# Patient Record
Sex: Female | Born: 1940 | Race: White | Hispanic: No | Smoking: Former smoker
Health system: Southern US, Community
[De-identification: ages and names within clinical notes are randomized; demographics above are authoritative.]

## PROBLEM LIST (undated history)

## (undated) DIAGNOSIS — Z9889 Other specified postprocedural states: Secondary | ICD-10-CM

## (undated) DIAGNOSIS — K5190 Ulcerative colitis, unspecified, without complications: Secondary | ICD-10-CM

## (undated) DIAGNOSIS — G894 Chronic pain syndrome: Secondary | ICD-10-CM

## (undated) DIAGNOSIS — I2510 Atherosclerotic heart disease of native coronary artery without angina pectoris: Secondary | ICD-10-CM

## (undated) DIAGNOSIS — M7060 Trochanteric bursitis, unspecified hip: Secondary | ICD-10-CM

## (undated) DIAGNOSIS — I10 Essential (primary) hypertension: Secondary | ICD-10-CM

## (undated) DIAGNOSIS — I739 Peripheral vascular disease, unspecified: Secondary | ICD-10-CM

## (undated) DIAGNOSIS — C4491 Basal cell carcinoma of skin, unspecified: Secondary | ICD-10-CM

## (undated) DIAGNOSIS — E785 Hyperlipidemia, unspecified: Secondary | ICD-10-CM

## (undated) DIAGNOSIS — I714 Abdominal aortic aneurysm, without rupture, unspecified: Secondary | ICD-10-CM

## (undated) DIAGNOSIS — R0789 Other chest pain: Secondary | ICD-10-CM

## (undated) DIAGNOSIS — R5382 Chronic fatigue, unspecified: Secondary | ICD-10-CM

## (undated) DIAGNOSIS — G4733 Obstructive sleep apnea (adult) (pediatric): Secondary | ICD-10-CM

## (undated) DIAGNOSIS — F32A Depression, unspecified: Secondary | ICD-10-CM

## (undated) DIAGNOSIS — I2699 Other pulmonary embolism without acute cor pulmonale: Secondary | ICD-10-CM

## (undated) DIAGNOSIS — M542 Cervicalgia: Secondary | ICD-10-CM

## (undated) DIAGNOSIS — M7050 Other bursitis of knee, unspecified knee: Secondary | ICD-10-CM

## (undated) DIAGNOSIS — D649 Anemia, unspecified: Principal | ICD-10-CM

## (undated) DIAGNOSIS — F329 Major depressive disorder, single episode, unspecified: Secondary | ICD-10-CM

## (undated) DIAGNOSIS — M797 Fibromyalgia: Secondary | ICD-10-CM

## (undated) DIAGNOSIS — G9332 Myalgic encephalomyelitis/chronic fatigue syndrome: Secondary | ICD-10-CM

## (undated) DIAGNOSIS — Z9289 Personal history of other medical treatment: Secondary | ICD-10-CM

## (undated) DIAGNOSIS — Z8679 Personal history of other diseases of the circulatory system: Secondary | ICD-10-CM

## (undated) HISTORY — DX: Essential (primary) hypertension: I10

## (undated) HISTORY — DX: Atherosclerotic heart disease of native coronary artery without angina pectoris: I25.10

## (undated) HISTORY — DX: Trochanteric bursitis, unspecified hip: M70.60

## (undated) HISTORY — DX: Other pulmonary embolism without acute cor pulmonale: I26.99

## (undated) HISTORY — DX: Abdominal aortic aneurysm, without rupture: I71.4

## (undated) HISTORY — DX: Personal history of other medical treatment: Z92.89

## (undated) HISTORY — PX: TUBAL LIGATION: SHX77

## (undated) HISTORY — DX: Abdominal aortic aneurysm, without rupture, unspecified: I71.40

## (undated) HISTORY — DX: Fibromyalgia: M79.7

## (undated) HISTORY — DX: Major depressive disorder, single episode, unspecified: F32.9

## (undated) HISTORY — DX: Depression, unspecified: F32.A

## (undated) HISTORY — DX: Cervicalgia: M54.2

## (undated) HISTORY — PX: HERNIA REPAIR: SHX51

## (undated) HISTORY — DX: Basal cell carcinoma of skin, unspecified: C44.91

## (undated) HISTORY — DX: Anemia, unspecified: D64.9

## (undated) HISTORY — DX: Hyperlipidemia, unspecified: E78.5

## (undated) HISTORY — DX: Obstructive sleep apnea (adult) (pediatric): G47.33

## (undated) HISTORY — DX: Chronic pain syndrome: G89.4

## (undated) HISTORY — DX: Ulcerative colitis, unspecified, without complications: K51.90

## (undated) HISTORY — DX: Other bursitis of knee, unspecified knee: M70.50

---

## 1989-08-04 HISTORY — PX: OTHER SURGICAL HISTORY: SHX169

## 1995-08-05 HISTORY — PX: CHOLECYSTECTOMY: SHX55

## 1996-08-04 HISTORY — PX: CORONARY ARTERY BYPASS GRAFT: SHX141

## 1998-06-15 ENCOUNTER — Other Ambulatory Visit: Admission: RE | Admit: 1998-06-15 | Discharge: 1998-06-15 | Payer: Self-pay | Admitting: Obstetrics and Gynecology

## 1999-10-11 ENCOUNTER — Encounter: Payer: Self-pay | Admitting: Cardiology

## 1999-10-11 ENCOUNTER — Encounter: Admission: RE | Admit: 1999-10-11 | Discharge: 1999-10-11 | Payer: Self-pay | Admitting: Cardiology

## 2003-06-07 ENCOUNTER — Other Ambulatory Visit: Admission: RE | Admit: 2003-06-07 | Discharge: 2003-06-07 | Payer: Self-pay | Admitting: Internal Medicine

## 2003-08-05 HISTORY — PX: CORONARY STENT PLACEMENT: SHX1402

## 2003-10-17 ENCOUNTER — Encounter: Admission: RE | Admit: 2003-10-17 | Discharge: 2003-10-17 | Payer: Self-pay | Admitting: Cardiology

## 2003-10-23 ENCOUNTER — Ambulatory Visit (HOSPITAL_COMMUNITY): Admission: RE | Admit: 2003-10-23 | Discharge: 2003-10-24 | Payer: Self-pay | Admitting: Cardiology

## 2004-05-08 ENCOUNTER — Ambulatory Visit (HOSPITAL_COMMUNITY): Admission: RE | Admit: 2004-05-08 | Discharge: 2004-05-08 | Payer: Self-pay | Admitting: Dentistry

## 2004-05-08 ENCOUNTER — Encounter (INDEPENDENT_AMBULATORY_CARE_PROVIDER_SITE_OTHER): Payer: Self-pay | Admitting: Specialist

## 2004-08-04 HISTORY — PX: CORONARY ANGIOPLASTY WITH STENT PLACEMENT: SHX49

## 2004-09-04 ENCOUNTER — Inpatient Hospital Stay (HOSPITAL_COMMUNITY): Admission: EM | Admit: 2004-09-04 | Discharge: 2004-09-05 | Payer: Self-pay | Admitting: Emergency Medicine

## 2004-09-25 ENCOUNTER — Encounter (HOSPITAL_COMMUNITY): Admission: RE | Admit: 2004-09-25 | Discharge: 2004-12-24 | Payer: Self-pay | Admitting: Cardiology

## 2004-12-25 ENCOUNTER — Encounter (HOSPITAL_COMMUNITY): Admission: RE | Admit: 2004-12-25 | Discharge: 2005-02-01 | Payer: Self-pay | Admitting: Cardiology

## 2005-01-07 ENCOUNTER — Encounter (HOSPITAL_COMMUNITY): Admission: RE | Admit: 2005-01-07 | Discharge: 2005-04-07 | Payer: Self-pay | Admitting: Gastroenterology

## 2005-02-24 ENCOUNTER — Emergency Department (HOSPITAL_COMMUNITY): Admission: EM | Admit: 2005-02-24 | Discharge: 2005-02-25 | Payer: Self-pay | Admitting: *Deleted

## 2005-02-26 ENCOUNTER — Encounter: Admission: RE | Admit: 2005-02-26 | Discharge: 2005-02-26 | Payer: Self-pay | Admitting: Gastroenterology

## 2005-03-13 ENCOUNTER — Ambulatory Visit (HOSPITAL_COMMUNITY): Admission: RE | Admit: 2005-03-13 | Discharge: 2005-03-13 | Payer: Self-pay | Admitting: Gastroenterology

## 2005-04-29 ENCOUNTER — Other Ambulatory Visit: Admission: RE | Admit: 2005-04-29 | Discharge: 2005-04-29 | Payer: Self-pay | Admitting: Internal Medicine

## 2005-05-21 ENCOUNTER — Ambulatory Visit (HOSPITAL_COMMUNITY): Admission: RE | Admit: 2005-05-21 | Discharge: 2005-05-21 | Payer: Self-pay | Admitting: Internal Medicine

## 2005-06-18 ENCOUNTER — Ambulatory Visit (HOSPITAL_COMMUNITY): Admission: RE | Admit: 2005-06-18 | Discharge: 2005-06-18 | Payer: Self-pay | Admitting: Cardiology

## 2005-07-31 ENCOUNTER — Inpatient Hospital Stay (HOSPITAL_COMMUNITY): Admission: AD | Admit: 2005-07-31 | Discharge: 2005-08-02 | Payer: Self-pay | Admitting: Gastroenterology

## 2005-08-02 ENCOUNTER — Ambulatory Visit: Payer: Self-pay | Admitting: Internal Medicine

## 2005-08-04 HISTORY — PX: CORONARY ANGIOPLASTY WITH STENT PLACEMENT: SHX49

## 2006-07-08 ENCOUNTER — Inpatient Hospital Stay (HOSPITAL_COMMUNITY): Admission: EM | Admit: 2006-07-08 | Discharge: 2006-07-09 | Payer: Self-pay | Admitting: Emergency Medicine

## 2006-07-30 ENCOUNTER — Encounter: Admission: RE | Admit: 2006-07-30 | Discharge: 2006-07-30 | Payer: Self-pay | Admitting: Cardiology

## 2007-04-28 ENCOUNTER — Other Ambulatory Visit: Admission: RE | Admit: 2007-04-28 | Discharge: 2007-04-28 | Payer: Self-pay | Admitting: Family Medicine

## 2009-09-13 ENCOUNTER — Other Ambulatory Visit: Admission: RE | Admit: 2009-09-13 | Discharge: 2009-09-13 | Payer: Self-pay | Admitting: Family Medicine

## 2010-04-23 ENCOUNTER — Encounter
Admission: RE | Admit: 2010-04-23 | Discharge: 2010-04-29 | Payer: Self-pay | Source: Home / Self Care | Attending: Physical Medicine and Rehabilitation | Admitting: Physical Medicine and Rehabilitation

## 2010-04-29 ENCOUNTER — Ambulatory Visit: Payer: Self-pay | Admitting: Physical Medicine and Rehabilitation

## 2010-12-20 NOTE — Discharge Summary (Signed)
Kara Bush, Kara Bush            ACCOUNT NO.:  [id number]   MEDICAL RECORD NO.:  [id number]          PATIENT TYPE:  INP   LOCATION:  6522                         FACILITY:  [facility]   PHYSICIAN:  Thereasa Solo. Little, M.D. DATE OF BIRTH:  1941-06-23   DATE OF ADMISSION:  09/03/2004  DATE OF DISCHARGE:  09/05/2004                                 DISCHARGE SUMMARY   HISTORY OF PRESENT ILLNESS:  Kara Bush is a 70 year old female patient who  came to the emergency room because of chest pain.  She has a history of  coronary artery disease dating back to 35.  She has undergone coronary  artery bypass surgery.  In March of 2005, she underwent stenting of an LAD  beyond the limb insertion, as well as stenting of the nongrafted circumflex.  She apparently was experiencing onset of chest pain and came to the  emergency room.  She was seen by Dr. Effie Shy who was on call for our  service.   HOSPITAL COURSE:  She then underwent cardiac catheterization on September 04, 2004, which revealed progressive disease with a new blockage past her LAD  stent.  She had an 80% stenosis.  She underwent Cypher stenting with a 3.0 x  13 by Nanetta Batty, M.D.  Her SVG to her diagonal was patent and her SVG  to her RCA was patent.  Her circumflex native vessel stent was also patent.  On the morning of September 05, 2004, she was seen by Gaspar Garbe B. Little, M.D.,  her primary cardiologist.  Her blood pressures were 120-130/60, heart rate  was in the 90s and room air saturations were 93%.  Her potassium was  slightly low at 3.4.  Her hemoglobin was 10.4, WBC was 10, BUN was 3 and  creatinine was 0.9.  Her LDL was 69 and HDL 39.  Troponin was 0.80 and CK  was 123/6.  Her EKG was without any changes.  Dr. Clarene Duke wanted to increase  her Vytorin to 10/40 and he wanted to add an ACE, however, this may have an  interaction with Imuran, thus we added an ARB for her endothelial vascular  health.  After she gets up and walks,  she will be discharged home.  She was  also given the potassium replacement with K-Dur 20 mEq.   DISCHARGE MEDICATIONS:  1.  Lexapro 10 mg one time per day.  2.  Prednisone 10 mg one time per day.  3.  Vytorin 10/40 mg one time a day.  4.  Lopressor 50 mg two times per day.  5.  Aspirin 81 mg one time per day.  6.  Plavix 75 mg one time per day.  She is not to stop.  She must be on it      for at least six months if not at least a year.  7.  Asacol 1600 mg three times per day.  8.  Cipro 500 mg two times per day for two days.  9.  Imuran 50 mg one time per day.  10. Diovan 40 mg which will be half of a tablet per day.  ACTIVITY:  She should do no strenuous activity, lifting, pushing or pulling  for four days.   DIET:  She is to be on a low-saturated diet.   WOUND CARE:  If she has any problems with her groin, she will give [place] a  call.   FOLLOWUP:  She will see Dr. Clarene Duke on September 25, 2004, at 9:15 a.m.   DISCHARGE DIAGNOSES:  1.  Unstable angina.  2.  Progressive coronary artery disease, status post cardiac catheterization      with a new lesion next to her left anterior descending stent.  She      underwent Cypher stenting 3.0 x 13.  Her circumflex stent was patent and      her saphenous vein graft to her diagonal and saphenous vein graft to her      right coronary artery were patent.  3.  Probable urinary tract infection treated with Cipro x 3 days.  4.  History of ulcerative colitis.  5.  History of fibromyalgia.  6.  Peripheral vascular disease with a prior aortic iliac bypass grafting      which was patent at the time of this catheterization.  7.  Hyperlipidemia.  8.  Hypertension.      BB/MEDQ  D:  09/05/2004  T:  09/05/2004  Job:  [id number]   cc:   Ike Bene, M.D.  [address]  [place]  [place] [address]  Fax: [phone]

## 2010-12-20 NOTE — Op Note (Signed)
Kara Bush, Kara Bush            ACCOUNT NO.:  [id number]   MEDICAL RECORD NO.:  [id number]          PATIENT TYPE:  AMB   LOCATION:  ENDO                         FACILITY:  [facility]   PHYSICIAN:  Anselmo Rod, M.D.  DATE OF BIRTH:  [birth year]   DATE OF PROCEDURE:  05/08/2004  DATE OF DISCHARGE:                                 OPERATIVE REPORT   PROCEDURE PERFORMED:  Colonoscopy with multiple biopsies.   ENDOSCOPIST:  Anselmo Rod, M.D.   INSTRUMENT USED:  Olympus video colonoscope.   INDICATION FOR PROCEDURE:  A 70 year old white female with severe diarrhea,  anemia, and a history of diverticulitis in the past.  Rule out colonic  polyps, masses, etc.   PREPROCEDURE PREPARATION:  Informed consent was procured from the patient.  The patient was fasted for eight hours prior to the procedure and prepped  with a bottle of magnesium citrate and a gallon of GoLYTELY the night prior  to the procedure.   PREPROCEDURE PHYSICAL:  VITAL SIGNS:  The patient had stable vital signs.  NECK:  Supple.  CHEST:  Clear to auscultation.  S1, S2 regular.  ABDOMEN:  Soft with normal bowel sounds.   DESCRIPTION OF PROCEDURE:  The patient was placed in the left lateral  decubitus position and sedated with an additional 50 mg of Demerol and 4 mg  of Versed in slow incremental doses.  Once the patient was adequately sedate  and maintained on low-flow oxygen and continuous cardiac monitoring, the  Olympus video colonoscope was advanced from the rectum to the cecum.  Patchy  erythema with mucosal inflammation and exudates were noted throughout the  colon.  Multiple biopsies were done.  There was evidence of scattered  diverticular disease throughout the colon as well.  The appendiceal orifice  and the ileocecal valve were visualized and photographed.  There was  significant amount of residual stool in the right colon.  The terminal ileum  could not be visualized.  Retroflexion in the rectum revealed  small internal  hemorrhoids.  The patient tolerated the procedure well without immediate  complications.   IMPRESSION:  1.  Small, nonbleeding internal hemorrhoids.  2.  Pandiverticulosis (early).  3.  Patchy erythema with mucosal inflammation and exudate, biopsy done to      rule out ulcerative colitis.   RECOMMENDATIONS:  1.  Await pathology results.  2.  Avoid all nonsteroidals including aspirin for now.  3.  Outpatient follow-up in the next two weeks for further recommendations.       JNM/MEDQ  D:  05/08/2004  T:  05/08/2004  Job:  [id number]   cc:   Pollyann Savoy, M.D.  [address]  [place], [place] [address]  Fax: [phone]   Ike Bene, M.D.  [address]  [place]  [place] [address]  Fax: [phone]

## 2010-12-20 NOTE — Discharge Summary (Signed)
NAMEANTONETTE, Kara Bush            ACCOUNT NO.:  [id number]   MEDICAL RECORD NO.:  [id number]          PATIENT TYPE:  INP   LOCATION:  6523                         FACILITY:  [facility]   PHYSICIAN:  Thereasa Solo. Little, M.D. DATE OF BIRTH:  11/09/1940   DATE OF ADMISSION:  07/07/2006  DATE OF DISCHARGE:  07/09/2006                               DISCHARGE SUMMARY   DISCHARGE DIAGNOSES:  1. Unstable angina with negative myocardial infarction.  2. Coronary disease with a 60% hyperdense proximal saphenous vein      graft to diagonal stenosis that required intervention after IVUS      was completed including a Taxus stent at the osteum.  Previous      stents were patent and left ventricle function was normal.  2A.  History of coronary artery bypass grafting in 1998 with stents  placed in 2005 and 2006.  1. Hypertension, controlled.  2. Dyslipidemia.  3. Ulcerative colitis.  4. Fibromyalgia.  5. Chronic obstructive pulmonary disease.  6. Peripheral vascular disease with history of aortofemoral bypass      grafting in 1991 by Dr. Hart Rochester and right brachial thrombectomy in      March 2005 by Dr. Arbie Cookey.   CONDITION ON DISCHARGE:  Improved.   PROCEDURE:  June 08, 2006, combined left heart cath with graft  __________  by Dr. Nanetta Batty.   June 08, 2006, PTCA stent appointment after IVUS of the saphenous  vein graft to the diagonal and undergoing PTCA with Taxus stent  deployment.   DISCHARGE MEDICATIONS:  1. Plavix 75 mg 1 daily.  2. Aspirin 81 mg daily.  3. Vytorin 10/80 1 every evening.  4. Prilosec 20 mg daily.  5. Diovan 40 mg daily.  6. Asacol 400 mg twice a day.  7. Atenolol 25 1/2 tablet twice a day.  8. Lexapro 10 mg daily.  9. Hydrocodone as before.  10.Multivitamin as before.  11.Omega 3 daily as before.  12.Os-Cal Plus D as before.  13.Ambien as before.  14.Nitroglycerine sublingual p.r.n. chest pain.   DIET:  Low-fat, low-salt diabetic diet.   DISCHARGE  INSTRUCTIONS:  1. Increase activity slowly.  2. No driving for 2 days.  3. No lifting for 3 days.  4. Wash cath site with soap and water.  Call if any bleeding,      swelling, or drainage.  5. Follow up with Dr. Clarene Duke in 2 weeks.  His office should call you      with a date and time.   HISTORY OF PRESENT ILLNESS:  A 70 year old white female admitted to [facility]  on July 07, 2006, by Dr. Effie Shy on call for Dr. Clarene Duke.  Patient has  a history cardiac disease dating back to 55.  She underwent bypass  grafting at that time and in 2005 and 2006, she underwent placement of  stents.  She has no history of MI or congestive heart failure.   Patient presented to the ER with a history of chest pain beginning 5  hours prior to presentation to the ER.  This was described as an aching  or throbbing in the  left upper anterior chest near her shoulder and  neck.  It did not radiate.  There were no associated symptoms of  dyspnea, diaphoresis, or nausea, and no exacerbating or ameliorating  factors.  She was sedentary at time of onset.  Was not related to meals,  respirations, activity or position.  It had improved somewhat.  Nitroglycerine was given in the emergency room, which did help it but  did not completely relieve the pain.   FAMILY HISTORY:  In addition to her own history of coronary disease, she  has a family history of coronary disease when the father had an MI in  his early 72's.   OUTPATIENT MEDICATIONS:  Essentially the same as discharge med's.  There  were no changes except nitroglycerine sublingual was given to the  patient.   ALLERGIES:  PENICILLIN.   PAST SURGICAL HISTORY:  1. Cholecystectomy.  2. Abdominal aorta aneurysm repair.  3. Hernia repair.   SOCIAL HISTORY:  See H&P.   REVIEW OF SYSTEMS:  See H&P.   PHYSICAL EXAMINATION:  GENERAL:  Alert, oriented, female, ambulating  without problems.  LUNGS:  Clear.  CARDIO:  S1 S2 regular rate and rhythm.  EXTREMITIES:   Right groin stable without hematoma.   HOSPITAL COURSE:  Kara Bush was admitted by Dr. Effie Shy for chest  pain.  She came in, placed on IV heparin, IV nitroglycerine, cardiac  enzymes were done, which were negative.  She underwent cardiac  catheterization and with the use of IVUS was found to have a hypodense  saphenous vein graft to the diagonal.  Taxus stent was placed by Dr.  Allyson Sabal reducing 60% stenosis to 0.  Patient tolerated the procedure well,  LV function was normal, and she was brought to the 6500 unit, and  July 09, 2006, she was ambulating without problems.  Vital signs were  stable.  Laboratory data was stable as well.  She was felt ready for  discharge home.   LABORATORY DATA:  Stable with negative cardiac enzymes.  EKG was within  normal limits.  Labs at discharge:  Hemoglobin 11.4, hematocrit 34.7,  WBC 8.6, platelets 279.  Sodium 139, potassium 3.9, BUN 17, creatinine  0.8, glucose 105.  Cholesterol panel HCL 35, LDL 47, total cholesterol  108, triglycerides 129.  Chest x-ray, no evidence of acute cardiac  pulmonary disease.   DISPOSITION:  Kara Bush, as stated, was admitted and underwent PTCA  and stent deployment and at time of discharge was stable.   __________  Dr. Allyson Sabal saw her and discharged but she was also seen by  Dr. Clarene Duke at the time of discharge.      Kara Bush, N.P.    ______________________________  Thereasa Solo Little, M.D.    LRI/MEDQ  D:  07/09/2006  T:  07/10/2006  Job:  [id number]   cc:   Thereasa Solo. Little, M.D.  Nanetta Batty, M.D.  Dr. Peter Congo

## 2010-12-20 NOTE — Cardiovascular Report (Signed)
NAME:  Kara Bush, Kara Bush                      ACCOUNT NO.:  [id number]   MEDICAL RECORD NO.:  [id number]                   PATIENT TYPE:  OIB   LOCATION:  2899                                 FACILITY:  [facility]   PHYSICIAN:  Thereasa Solo. Little, M.D.              DATE OF BIRTH:  08-30-1940   DATE OF PROCEDURE:  10/23/2003  DATE OF DISCHARGE:                              CARDIAC CATHETERIZATION   INDICATION FOR TEST:  This 70 year old female had bypass surgery in 1998.  She has fibromyalgia and has been having recurrent episodes of atypical  chest discomfort with increasing dyspnea on exertion and generalized  fatigue.  A nuclear study was positive for anterior ischemia and because of  this, she is brought in as an outpatient for cardiac catheterization with  graft visualization.   She had an abdominal aortic aneurysm repair prior to her bypass surgery and  because of this, guidewire exchange technique was performed and the iliacs  and terminal aorta were negotiated initially with the right coronary  catheter and a Glidewire.   The patient was prepped and draped in the usual sterile fashion, exposing  the right groin.  Following local anesthetic with 1% Xylocaine with  Seldinger technique employed, a 5 French introducer sheath was placed into  the left femoral artery.  Caution going up the iliacs and up the distal  aorta was used because of the prior abdominal aortic aneurysm repair.  The  Glidewire was placed into the ascending aorta without difficulty.  Left and  right coronary arteriography, visualization, ventriculography, and a distal  aortogram were performed.  Following this, complex intervention to two  vessels was performed.   DIAGNOSTIC EQUIPMENT:  Five French Judkins configuration catheters.   RESULTS:   HEMODYNAMIC MONITORING:  1. Central aortic pressure was 142/61.  2. Left ventricular pressure was 147/3 with no aortic valve gradient at the     time of pullback.   VENTRICULOGRAPHY:  Ventriculography in the RAO projection using 20 mL of  contrast at 12 mL/sec. revealed normal left ventricular systolic function.  The aortic root appeared to be slightly dilated.  Left ventricular end-  diastolic pressure was 15, ejection fraction greater than 60%.   DISTAL AORTOGRAM:  Distal aortogram using 25 mL at 12 mL/sec. done just  above the level of the renal arteries showed no evidence of renal artery  stenosis.  The graft in the terminal aorta was widely patent and there was  tortuosity but no high-grade stenosis in the iliacs.   CORONARY ARTERIOGRAPHY:  1. Left main normal.  2. LAD:  The LAD extended down and around the apex of the heart at the     midportion of the vessel at the septal perforators with a 90% eccentric     area of narrowing.  3. Circumflex:  The circumflex, which was a nongrafted vessel in 1998, had     an 80% area in the proximal portion just  before the takeoff of a very     large OM vessel.  4. Right coronary artery:  100% occluded proximally.   GRAFTS:  1. Saphenous vein graft to the RCA:  The graft was widely patent.  The PDA     and posterolateral branch were widely patent.  2. Saphenous vein graft to the diagonal:  The graft was patent.  The     diagonal was patent.  3. Internal mammary artery to the LAD:  The LIMA was very small and     basically nonfunctional.  There was some visualization of the LAD and     there was clear evidence of bidirectional flow in the mid- and distal     portion of the LAD.   CONCLUSION:  Widely patent grafts to both the right coronary artery and  diagonal with withering left internal mammary artery to the left anterior  descending coronary artery and high-grade stenosis in the proximal portion  of the left anterior descending and also high-grade stenosis in the proximal  portion of the circumflex, which was a nongrafted vessel.   Because of the above-mentioned anatomy, arrangements were made for   multivessel intervention.  The previously-placed 5 French sheath was  exchanged out for a 7 [place] sheath.  A 6 French JL4.5 cm curved guide  catheter was used.  A short Luge wire was placed down the LAD.  A 3.0 x 16  Taxus stent would not cross the area of obstruction.  Because of this, a 2.0  x 9 Maverick balloon was used for predilatation.  A single inflation of 9  atmospheres for 60 seconds was performed.  Once this was done, this Taxus  stent passed easily.   The 3.0 x 16 Taxus stent was placed in the midportion of the LAD in such a  manner that both the proximal and distal portions of the lesion were well-  covered.  It was initially deployed at 12 for 60 seconds with the final  inflation being 12 x 55 seconds.  The area that had been 90% pre-  intervention now appeared to be normal post stenting.  There was minimal  narrowing distal to the stent but brisk TIMI-3 flow, no evidence of any  dissection or thrombus.   Attention then was addressed to the circumflex.  The same guide catheter was  used but a new Luge wire was used because the tip had become unresponsive.  The Luge wire was placed down the OM vessel.  A 3.0 x 16 Taxus stent was  placed in the proximal portion of the vessel, making sure that both the  proximal and distal portion of the obstruction was covered.  Twelve x60  seconds was the initial deployment with the final inflation being 11 x60  seconds.  With this the area that had been 80% narrowed before intervention  now appeared to be normal with brisk TIMI-3 flow, no evidence of any  dissection or thrombus.   During the procedure the patient was given double bolus Integrilin and was  started on an Integrilin drip, which will be maintained for an additional 18  hours.  She was given a total of 6200 units of IV heparin, making sure her  ACT remained greater than 200 during the entire case.  Because of intermittent  back pain, she was given IV Versed and IV Nubain.  At  the termination of the  procedure she was given 300 mg of oral Plavix.  The sheath should be  removed  later today, and she should be ready for discharge in the morning.                                               Thereasa Solo. Little, M.D.    ABL/MEDQ  D:  10/23/2003  T:  10/25/2003  Job:  [id number]   cc:   [facility]   Ike Bene, M.D.  [address]  [place]  [place] [address]  Fax: [phone]

## 2010-12-20 NOTE — Cardiovascular Report (Signed)
Kara Bush, Kara Bush            ACCOUNT NO.:  [id number]   MEDICAL RECORD NO.:  [id number]          PATIENT TYPE:  INP   LOCATION:  2807                         FACILITY:  [facility]   PHYSICIAN:  Nanetta Batty, M.D.   DATE OF BIRTH:  September 08, 1940   DATE OF PROCEDURE:  07/07/2006  DATE OF DISCHARGE:                            CARDIAC CATHETERIZATION   PROCEDURE:  Cardiac catheterization/percutaneous coronary intervention.     Kara Bush is a 70 year old female nine years status post coronary  bypass grafting with a history of hypertension, dyslipidemia,  fibromyalgia, and also has colitis.  Her last procedure performed by  myself September 04, 2004, was notable for stenting of her LAD.  The LIMA  was atretic.  She does have a history of aortobifemoral bypass grafting.  She was admitted December 4 with unstable angina and ruled out for  myocardial infarction.  She presents now for angiography with potential  intervention to define her anatomy and rule out ischemic etiology.   DESCRIPTION OF PROCEDURE:  The patient was brought to the second floor  [facility] in a postabsorptive state.  She was premedicated with p.o. Valium, IV Versed and fentanyl.  Her left  groin was prepped and shaved in the usual sterile fashion.  1% Xylocaine  was used for local anesthesia.  A 6-French sheath was inserted into the  left femoral artery using standard Seldinger technique.  Access was  obtained into the left limb of the aortobifemoral bypass graft and  central circulation under direct angiographic control using a right  Judkins catheter.  6-French Judkins diagnostic catheters as well as a 6  French pigtail catheter were used for selective cholangiography, left  ventriculography, selective IMA and vein graft angiography as well as  distal abdominal aortography.  Visipaque dye was used for the entirety  of the case.  Retrograde aortic and left ventricular blood  pressures  were recorded.   HEMODYNAMIC RESULTS:  Left ventricular systolic pressure 162 with  diastolic pressure 19.  There was no pullback gradient noted.   SELECTIVE CORONARY ANGIOGRAPHY:  1. Left main normal.  2. Left anterior descending:  The LAD stents were widely patent.  3. Left circumflex:  The circumflex stent was widely patent.  4. Right coronary artery:  Occluded in its mid portion.  5. Vein graft to the distal right widely patent.  6. LIMA to the LAD atretic.  7. Vein graft to the diagonal branch patent with a 60% hyperdense      proximal stenosis.  8. Left ventriculography:  RAO left ventriculogram was performed using      a 25 mL of Visipaque dye at 12 mL per second.  The overall LVEF was      estimated at greater than 60% without focal wall motion      abnormalities.  9. Distal abdominal aortography:  Performed using 20 mL of Visipaque      dye at 20 mL per second.  The renal arteries were widely patent.      The infrarenal abdominal aorta and bifurcation of the      aortobifemoral bypass graft appeared  normal as did the distal      anastomosis on the left.   IMPRESSION:  Kara Bush has anatomy unchanged from her last cath except  for a moderate stenosis at the proximal segment of the vein graft to the  diagonal branch.  We will proceed with intravascular ultrasound directed  PCI and stenting with Angiomax.   The patient was on aspirin and Plavix.  She received an additional three  chewable aspirin and 300 mg of p.o. Plavix as well as Angiomax bolus  with an ACT of 255.   The 6-French sheath was upgraded to a 7-French sheath.  Using a 7-French  JR-4 guide catheter along with a 190 Asahi soft wire and a 3.2 [place]  Galaxy IVUS catheter, intravascular ultrasound was performed.  The  distal reference segment measured approximately 3.5 mm.  There was heavy  plaque burden in the proximal segment with a minimal lumen diameter of  1.3 x 2.1 mm giving a minimal lumen  area at the point of stenosis of 2.3  mm squared.  Following this, stenting was performed with a 3 by 16 Taxus  stent deploying it at 14 atmospheres (3.28 mm) resulting in reduction of  60% hyperdense proximal segmental SVG diagonal branch stenosis to 0%  residual.  Completion IVUS run was performed revealing a stent diameter  3.2 mm with good apposition and good distal transition.   IMPRESSION:  Successful IVUS guided PCI and stenting of the proximal  segment of the SVG to diagonal branch using Angiomax and IVUS guidance  and Taxus drug-eluting stent.  The patient tolerated the procedure well.   The guidewire and catheter were removed.  The sheath was sewn securely  in place.  The patient left the lab in stable condition.   PLAN:  Remove the sheath in two hours.  The patient will be discharged  home in the morning after being hydrated overnight on aspirin and  Plavix.  Dr. Clarene Duke was notified of these results.      Nanetta Batty, M.D.  Electronically Signed     JB/MEDQ  D:  07/08/2006  T:  07/09/2006  Job:  [id number]   cc:   Thereasa Solo. Little, M.D.

## 2010-12-20 NOTE — Discharge Summary (Signed)
NAMEEUGINA, ROW            ACCOUNT NO.:  [id number]   MEDICAL RECORD NO.:  [id number]          PATIENT TYPE:  INP   LOCATION:  5018                         FACILITY:  [facility]   PHYSICIAN:  Jordan Hawks. Elnoria Howard, MD    DATE OF BIRTH:  10/28/1940   DATE OF ADMISSION:  07/31/2005  DATE OF DISCHARGE:  08/02/2005                                 DISCHARGE SUMMARY   Please see the history and physical dictated on July 31, 2005, for  complete details.   HOSPITAL COURSE:  The patient was admitted to the hospital for her  ulcerative colitis flare and placed on n.p.o. status as well started on Solu-  Medrol 40 mg b.i.d.  She tolerated medications well and was without any  complaints.  The n.p.o. status helped to arrest the diarrhea, and she  maintained stable throughout her hospitalization.  On hospital day #2, she  was started back on liquids and advanced as tolerated.  On the day of  discharge she was able to tolerate p.o. without any difficulty and no  evidence of any diarrhea at the time.  Subsequently it was felt that the  patient was stable and able to be discharged in good condition.  The plan is  for the patient to follow up with Dr. Elnoria Howard in one to two weeks and to call  with any questions that before that time.  She is to be resume her  prednisone at 40 mg daily.      Jordan Hawks Elnoria Howard, MD  Electronically Signed     PDH/MEDQ  D:  08/07/2005  T:  08/07/2005  Job:  [id number]

## 2010-12-20 NOTE — Discharge Summary (Signed)
NAME:  Kara Bush, Kara Bush                      ACCOUNT NO.:  [id number]   MEDICAL RECORD NO.:  [id number]                   PATIENT TYPE:  OIB   LOCATION:  6523                                 FACILITY:  [facility]   PHYSICIAN:  Thereasa Solo. Little, M.D.              DATE OF BIRTH:  09-30-1940   DATE OF ADMISSION:  10/23/2003  DATE OF DISCHARGE:  10/24/2003                                 DISCHARGE SUMMARY   FINAL DIAGNOSES:  1. Coronary artery disease, status post coronary artery bypass grafting in     1998.  2. Status post artery cath this admission with stenting of the left anterior     descending beyond the limb insertion and stenting of the non-grafted     circumflex with Taxus stent.  3. Known peripheral vascular disease, status post ___________ bypass     grafting.  4. Normal left ventricular systolic function.  5. Hyperlipidemia.  6. Hypertension.  7. Fibromyalgia.   HOSPITAL COURSE:  This is a 70 year old Caucasian female patient of Dr.  Clarene Duke who presented to the office with complaints of chest tightness and  exertional discomfort.  She underwent Cardiolite in our office that showed a  mild ischemia in the front of the heart, apical thinning and some ischemia  in the LAD territory which could be elusive breast attenuation but taking  into consideration the patient's symptoms of exertional chest discomfort,  tightness, and shortness of breath, Dr. Clarene Duke made the decision to proceed  with coronary angiography.   She was brought to the hospital to the short-stay unit for elective coronary  angiography.   HOSPITAL COURSE:  Cardiac catheterization performed on October 23, 2003 by Dr.  Clarene Duke. __________ heart rate, high grade stenosis of nongrafted circumflex  approximately 80% and also showed mid-LAD 90% stenosis of the central  perforator bifurcation and the angiography of these two vessels were  performed.  Taxus stent was inserted into the circumflex and also LAD was  stented with non drug eluting stent Maverick and the patient tolerated the  procedure well, was given bolus and drip of Integrilin and heparin was  given, Plavix 300 mg the day of procedure.  The next morning, she was found  in stable condition.  Her hemoglobin was 12.1.  BUN was 12, creatinine 0.8.   The patient was discharged home with stable vital signs and free of any  complaints.   DISCHARGE ACTIVITIES:  No driving, no lifting greater than 5 pounds, no  strenuous activity for three days post catheterization.   DIET:  Low cholesterol diet.   FOLLOW UP:  Dr. Clarene Duke will see the patient in the office on November 23, 2003  at 11:45 a.m.   MEDICATIONS:  Discharge medications include Plavix 75 mg daily for six  months, Vytorin 10/20 mg daily, aspirin 81 mg p.o. daily, atenolol 12.5 mg  b.i.d., Lexapro 10 mg daily and Ambien 10 mg q.h.s.  Raymon Mutton, P.A.                    Thereasa Solo. Little, M.D.    MK/MEDQ  D:  10/24/2003  T:  10/26/2003  Job:  [id number]

## 2010-12-20 NOTE — H&P (Signed)
NAMEKALEEA, PENNER NO.:  [id number]   MEDICAL RECORD NO.:  [id number]          PATIENT TYPE:  EMS   LOCATION:  MAJO                         FACILITY:  [facility]   PHYSICIAN:  Ulyses Amor, MD DATE OF BIRTH:  08-Feb-1941   DATE OF ADMISSION:  07/07/2006  DATE OF DISCHARGE:                              HISTORY & PHYSICAL   Kara Bush is a 70 year old white woman who is admitted to [facility] for further evaluation of chest pain.   The patient has a history of cardiac disease which dates back to 22.  At that time, she underwent coronary artery bypass surgery.  In 2005 and  in 2006, she underwent placement of several stents.  She has no history  of myocardial infarction nor is there is a history of congestive heart  failure or arrhythmia.   The patient presented to the emergency department with a history of  chest pain which began approximately five hours ago.  This was described  as an ache or throbbing in her left upper anterior chest near her  shoulder and axilla.  The discomfort does not radiate.  There has been  no associated dyspnea, diaphoresis or nausea.  There were no  exacerbating or ameliorating factors.  She was sedentary at the time of  this onset.  It appears not to be related to meals, respirations,  activity or position.  It has subsided somewhat since its onset, though  it has not resolved.  Nitroglycerin given in the emergency department  improved, though it did not completely relieve it.  The patient is  unsure as to whether or not this discomfort is similar to the chest  discomfort she experienced prior to her revascularizations.   The patient has a number of risk factors for coronary artery disease  including hypertension, dyslipidemia and family history (father had a  myocardial infarction in his early 48s).  There is no history of  diabetes mellitus.  She discontinued smoking in the past.   Other medical problems  include ulcerative colitis and fibromyalgia.   MEDICATIONS:  Asacol, aspirin, atenolol, Diovan, Lexapro and Plavix.   ALLERGIES:  PENICILLIN.   OPERATIONS:  1. Cholecystectomy.  2. Abdominal aortic aneurysm repair.  3. Hernia repair.   SIGNIFICANT INJURIES:  None.   SOCIAL HISTORY:  The patient is retired.  She lives alone.  She is  divorced.  She neither smokes cigarettes or drinks alcohol.   FAMILY HISTORY:  Notable for early coronary artery disease in her  father.   REVIEW OF SYSTEMS:  Reveals no new problems related to her head, eyes,  ears, nose, mouth, throat, lungs, gastrointestinal system, genitourinary  system or extremities.  There was no history of neurologic or  psychiatric disorder.  There was no history of fever, chills or weight  loss.   PHYSICAL EXAMINATION:  Blood pressure 115/56, pulse 90 and regular,  respirations 18, temperature 98.0.  The patient was an elderly white  woman in no discomfort.  She was alert, oriented, appropriate and  responsive.  Head, eyes, nose and mouth were normal.  The neck was  without thyromegaly or adenopathy.  Carotid pulses were palpable  bilaterally without bruits.  Cardiac examination revealed a normal S1-  S2.  There was no S3, S4, murmur, rub or click.  Cardiac rhythm was  regular.  No chest wall tenderness was noted.  The lungs were clear.  The abdomen was soft and nontender.  There was no mass,  hepatosplenomegaly, bruit, distention, rebound, guarding or rigidity.  Bowel sounds were normal.  Breasts, pelvic and rectal examinations were  not performed as they were not pertinent to the reason for acute care  hospitalization.  The extremities were without edema, deviation or  deformity.  Radial and dorsalis pedis pulses were palpable bilaterally.  Brief screening neurologic survey was unremarkable.   The electrocardiogram was normal.  There were no repolarization  abnormalities specific for ischemia or infarction.  The  chest  radiograph, according to the radiologist, demonstrated no evidence of  acute cardiopulmonary disease.  The potassium was 3.8, BUN 19 and  creatinine 0.9.  White count was 11.4 with a hemoglobin of 11.8 and  hematocrit of 35.4.  The initial set of cardiac markers revealed a  myoglobin of 83.3, CK-MB less than 1.0 and troponin less than 0.05.  The  remaining studies were pending at the time of this dictation.   IMPRESSION:  1. Chest pain, rule out unstable angina.  2. Coronary artery disease, status post coronary artery bypass surgery      in 1998 and stents in 2005 and 2006.  3. Hypertension.  4. Dyslipidemia.  5. Ulcerative colitis.  6. Fibromyalgia.   PLAN:  1. Telemetry.  2. Serial cardiac enzymes.  3. Aspirin.  4. Intravenous heparin.  5. Intravenous nitroglycerin.  6. Further measures per Dr. Clarene Duke.      Ulyses Amor, MD  Electronically Signed     MSC/MEDQ  D:  07/07/2006  T:  07/08/2006  Job:  [id number]   cc:   Thereasa Solo. Little, M.D.

## 2010-12-20 NOTE — H&P (Signed)
NAMEBRISEIDY, SPARK NO.:  [id number]   MEDICAL RECORD NO.:  [id number]          PATIENT TYPE:  EMS   LOCATION:  MAJO                         FACILITY:  [facility]   PHYSICIAN:  Ulyses Amor, MD DATE OF BIRTH:  07-Jun-1941   DATE OF ADMISSION:  09/03/2004  DATE OF DISCHARGE:                                HISTORY & PHYSICAL   HISTORY OF PRESENT ILLNESS:  Kara Bush is a 70 year old white woman  who was admitted to [facility] for further evaluation of chest  pain.   The patient has a history of coronary artery disease, which dates back to  43.  At that time, she underwent coronary artery bypass surgery.  In March  of 2005, she underwent stenting of the LAD beyond the limb insertion, as  well as stenting of the non-grafted circumflex.  Her cardiac course has been  uncomplicated since then.  She presented to the emergency department with a  history of chest pain which began at approximately 7 p.m. this evening.  This was described as a substernal pressure.  It did not radiate.  It was  associated with diaphoresis, but no dyspnea or nausea.  There were no  exacerbating or ameliorating factors.  It appeared not to be related to  position, activity, meals, or respirations.  She presented to the emergency  department where she was given nitroglycerin intravenously, which seemed to  improve her chest pain.  Her chest pain is largely resolved at this time.   There is no history of congestive heart failure or arrhythmia.  She is known  to have normal left ventricular function.   She has a number of risk factors for coronary artery disease.  She has a  history of hypertension and dyslipidemia.  Her father died of a myocardial  infarction in his 70s.  She has a sibling who is also affected by coronary  artery disease.  Her mother died of congestive heart failure, but at an  advanced age.  The patient has a history of smoking, which she stopped  approximately 12 years ago.  There is no history of diabetes mellitus.   In addition to the problems noted above, the patient has a history of  peripheral vascular disease.  She is status post aortobifemoral bypass  grafting.  She also has a history of fibromyalgia.   The patient also has a history of ulcerative colitis.   MEDICATIONS:  The patient is on a number of medications for the  aforementioned problems.  These include -  1.  Atenolol.  2.  Prednisone.  3.  Ambien.  4.  Lexapro.  5.  Azathioprine.  6.  Aspirin.   ALLERGIES:  PENICILLIN.   OPERATIONS:  1.  Cholecystectomy.  2.  Coronary artery bypass surgery.  3.  Aortobifemoral bypass grafting.   INSIGNIFICANT INJURIES:  None.   SOCIAL HISTORY:  The patient lives alone.  She is divorced.  She does not  smoke.  She neither smokes nor drinks.   REVIEW OF SYSTEMS:  No new problems related to her head, eyes, ears, nose,  mouth, throat, lungs,  gastrointestinal system, genitourinary system, or  extremities.  There is no history of neurologic or psychiatric disorder.  There is no history of fever, chills, or weight loss.   PHYSICAL EXAMINATION:  VITAL SIGNS:  Blood pressure 172/75, pulse 98 and  regular, respirations 12, temperature 97.9.  GENERAL:  The patient was a middle-aged white woman in no discomfort.  She  was alert, oriented, appropriate, and responsive.  HEENT:  Head, eyes, nose, and mouth were normal.  NECK:  Without thyromegaly or adenopathy.  Carotid pulses were palpable  bilaterally and without bruits.  CARDIAC:  A normal S1 and S2.  There was no S3, S4, murmur, rub, or click.  Cardiac rhythm is regular.  No chest wall tenderness was noted.  LUNGS:  Clear.  ABDOMEN:  Soft and nontender.  There was no mass, hepatosplenomegaly, bruit,  distention, rebound, guarding, or rigidity.  Bowel sounds were normal.  BREASTS/PELVIC/RECTAL EXAMINATIONS:  Not performed, as they were not  pertinent to the reason for acute  care  hospitalization.  EXTREMITIES:  Without edema, deviation, or deformity.  Radial and dorsalis  pedal pulses were palpable bilaterally.  NEUROLOGIC:  A brief screening neurologic survey was unremarkable.   DIAGNOSTIC STUDIES:  The initial electrocardiogram demonstrated normal sinus  rhythm.  There was what appeared to be approximately 2 mm of ST segment  elevation in V1 through V3, which was not present on the March 2005 tracing.  There was also anterolateral ST segment depression.  In the process of  considering whether an acute intervention is warranted, the  electrocardiogram was repeated.  This was normal.  There is no evidence of  any ST segment elevation.  The chest radiograph had not been performed at  this time.   LABORATORY STUDIES:  Not yet available.   IMPRESSION:  1.  Unstable angina.  The first electrocardiogram demonstrated evidence of      ST elevation in V2 and V3, which was not present on the March 2005      tracing.  There was also anterolateral ST segment depression.  In the      process of considering whether an acute intervention was needed, a      second electrocardiogram was performed.  It with was normal.  There was      no ST segment elevation at all.  The first set of cardiac markers, just      reported now, are negative.  In addition, her chest pain has largely      resolved.  Under these circumstances.  Acute emergent intervention is      not warranted at this time.  Should she have further chest pain, or      should further abnormality develop on the electrocardiogram suggestive      of an acute myocardial infarction, acute intervention may be      reconsidered.  2.  Coronary artery disease.  Status post coronary artery bypass in 1998.      Status post LAD and circumflex stents in 2005.  3.  Peripheral vascular disease, status post aortobifemoral bypass in 1991.  4.  Dyslipidemia.  5.  Hypertension.  6.  Fibromyalgia. 7.  Ulcerative colitis.    PLAN:  1.  Cardiac step down.  2.  Serial cardiac enzymes.  3.  Aspirin.  4.  Intravenous nitroglycerin.  5.  Intravenous heparin.  6.  Metoprolol.  7.  Further measures per Dr. Clarene Duke.   The patient's daughter was present throughout the patient encounter.  MSC/MEDQ  D:  09/04/2004  T:  09/04/2004  Job:  [id number]   cc:   Thereasa Solo. Little, M.D.  [address]  [address]  [place]  [place] [address]  Fax: [phone]

## 2010-12-20 NOTE — Op Note (Signed)
Kara Bush, Kara Bush            ACCOUNT NO.:  [id number]   MEDICAL RECORD NO.:  [id number]          PATIENT TYPE:  AMB   LOCATION:  ENDO                         FACILITY:  [facility]   PHYSICIAN:  Anselmo Rod, M.D.  DATE OF BIRTH:  February 19, 1941   DATE OF PROCEDURE:  05/08/2004  DATE OF DISCHARGE:                                 OPERATIVE REPORT   PROCEDURE PERFORMED:  Esophagogastroduodenoscopy with multiple biopsies.   ENDOSCOPIST:  Anselmo Rod, M.D.   INSTRUMENT USED:  Olympus video panendoscope.   INDICATIONS FOR PROCEDURE:  A 70 year old white female with history of  anemia and severe diarrhea.  Small-bowel biopsies planned.   PREPROCEDURE PREPARATION:  Informed consent was procured from the patient.  The patient fasted for eight hours prior to the procedure.   PREPROCEDURE PHYSICAL EXAMINATION:  VITAL SIGNS:  Stable.  NECK:  Supple.  CHEST:  Clear to auscultation.  CARDIOVASCULAR:  S1 and S2 regular.  ABDOMEN:  Soft with normal bowel sounds.   DESCRIPTION OF PROCEDURE:  The patient was placed in left lateral decubitus  position, sedated with 50 mg of Demerol and 6 mg of Versed in slow  incremental doses.  Once the patient was adequately sedated and maintained  on low flow oxygen and continuous cardiac monitoring, the Olympus video  panendoscope was advanced through the mouthpiece over the tongue and into  the esophagus under direct vision.  The entire esophagus appeared normal  with no evidence of ring, stricture, masses, esophagitis or Barrett's  mucosa.  The scope was then advanced in the stomach.  Antral gastritis was  noted.  Biopsies were done to rule out presence of H. pylori by pathology.  The proximal small-bowel appeared normal except for mild duodenitis in the  duodenal bulb.  Small-bowel biopsies were done to rule out sprue.  There was  no evidence of outlet obstruction.  Retroflexion of the high cardia revealed  no abnormalities.  The patient tolerated the  procedure well without  immediate complications.   IMPRESSION:  1.  Normal-appearing esophagus.  2.  Antral gastritis, biopsies done for Helicobacter pylori.  3.  Mild duodenitis in duodenal bulb.  4.  Proximal small-bowel biopsies to rule out sprue.   RECOMMENDATIONS:  1.  Await pathology result.  2.  Avoid nonsteroidals for now.  3.  Proceed with colonoscopy at this time.  Further recommendation will be      made in follow-up.       JNM/MEDQ  D:  05/08/2004  T:  05/08/2004  Job:  [id number]   cc:   Pollyann Savoy, M.D.  [address]  [place], [place] [address]  Fax: [phone]   Ike Bene, M.D.  [address]  [place]  [place] [address]  Fax: [phone]

## 2010-12-20 NOTE — H&P (Signed)
NAMEMCKENNAH, KRETCHMER            ACCOUNT NO.:  [id number]   MEDICAL RECORD NO.:  [id number]          PATIENT TYPE:  INP   LOCATION:  5018                         FACILITY:  [facility]   PHYSICIAN:  Jordan Hawks. Elnoria Howard, MD    DATE OF BIRTH:  [birth year]   DATE OF ADMISSION:  07/31/2005  DATE OF DISCHARGE:                                HISTORY & PHYSICAL   REASON FOR ADMISSION:  Ulcerative colitis flare.   HISTORY OF PRESENT ILLNESS:  This is a 70 year old white female with a past  medical history of panulcerative colitis, hypertension, fibromyalgia,  hyperlipidemia, and coronary artery disease, admitted for an ulcerative  colitis flare.  The patient was diagnosed in October, 2005, and since that  time, she has had an intermittent course of flares.  The patient is  difficult to wean from the prednisone, as any dosage below 10 mg, the  patient has a recurrence of her symptoms.  Other immunosuppressant  medications have been tried, such as azathioprine and Remicade.  Unfortunately, these have not produced the desired immunosuppression of the  ulcerative colitis.  With Remicade, she developed severe respiratory  infections and did not notice any clinical benefit from the Remicade.  The  patient was not brought into remission with Asacol and azathioprine in  control of her symptoms.  She complained about having recurrence of her  symptoms approximately one week ago that started acutely.  She subsequently  was treated by Dr. Loreta Ave as an outpatient, starting with 40 mg of steroids.  Unfortunately, her bowel movements persisted, where she states having  greater than 10 bowel movements per day, which are small volume and bloody.  Oral intake does worsen her symptoms.  The patient overall feels unwell and  subsequently represented back to the office for further evaluation.  At that  time, it was felt that the patient required a hospitalization with IV  steroids and bowel rest in order to induce  remission.  Patient denies having  any fever or severe abdominal pain.   PAST MEDICAL HISTORY:  As stated above.   PAST SURGICAL HISTORY:  As stated above.   MEDICATIONS:  1.  Asacol 400 mg 4 tablets p.o. t.i.d.  2.  Diovan 80 mg 1/2 tablet p.o. daily.  3.  Lexapro 10 mg p.o. q.h.s.  4.  Atenolol 25 mg 1/2 tablet b.i.d.  5.  Ambien 10 mg p.o. q.h.s.  6.  Clopidogrel 75 mg 1 p.o. daily.  7.  Vytorin 10 mg p.o. q.h.s.  8.  Hyoscyamine 0.375 mg p.o. b.i.d.  9.  Hydrocodone 10/325 mg p.o. 1/2 tablet q.3-4h. p.r.n.  10. Aspirin 81 mg p.o. daily.   FAMILY HISTORY:  Noncontributory.   SOCIAL HISTORY:  Negative x3.   REVIEW OF SYSTEMS:  Positive for diarrhea, abdominal pain, fatigue.  Some  back pain.  No dizziness, vertigo, dysarthria, dysuria, dysphagia, chest  pain, shortness of breath, skin rashes.   PHYSICAL EXAMINATION:  VITAL SIGNS:  Stable.  GENERAL:  The patient is in no acute distress.  Alert and oriented.  HEENT:  Normocephalic and atraumatic.  Extraocular muscles are intact.  Pupils are equal, round and reactive to light.  NECK:  Supple.  No lymphadenopathy.  LUNGS:  Clear to auscultation bilaterally.  CARDIOVASCULAR:  Regular rate and rhythm.  ABDOMEN:  Obese, soft.  Mild tenderness to palpation on the right side and  periumbilical region.  No rebound or rigidity.  EXTREMITIES:  No clubbing, cyanosis or edema.   LABORATORY VALUES:  Pending at this time.   IMPRESSION:  Ulcerative colitis flare:  The patient has a history of pan  colitis.  Stool studies were negative on an outpatient basis.  Unfortunately, the patient is not able to be brought about in remission on  an outpatient basis; therefore, is being admitted for further treatment.   PLAN:  1.  Solu-Medrol 40 mg IV q.12h.  2.  Bowel rest.  Patient will be on n.p.o. status.  3.  IV hydration.  4.  Recheck for C. diff.  5.  Patient will be followed clinically.  6.  Continue Asacol 400 mg 4 tablets p.o.  t.i.d.      Jordan Hawks Elnoria Howard, MD  Electronically Signed     PDH/MEDQ  D:  07/31/2005  T:  07/31/2005  Job:  [id number]

## 2010-12-20 NOTE — Cardiovascular Report (Signed)
Kara Bush, Kara Bush            ACCOUNT NO.:  [id number]   MEDICAL RECORD NO.:  [id number]           PATIENT TYPE:  INP   LOCATION:                               FACILITY:  [facility]   PHYSICIAN:  Nanetta Batty, M.D.   DATE OF BIRTH:  02/15/1941   DATE OF PROCEDURE:  09/04/2004  DATE OF DISCHARGE:                              CARDIAC CATHETERIZATION   PROCEDURE:  Cardiac catheterization/percutaneous coronary intervention  stent.   HISTORY:  Kara Bush is a 70 year old white female with history of CAD and  PVOD.  She had CABG in 1998, and aortobifemoral in 1991.  Her other problems  including hypertension and hyperlipidemia.  She had PCI stenting of her LAD  and circumflex on 10/23/2003.  She was admitted last night with chest pain,  positive enzymes and EKG changes.  She presents now for diagnostic coronary  angiography and potential intervention.   PROCEDURE DESCRIPTION:  The patient was brought to the second floor [facility] cardiac catheterization lab in a postabsorptive state. She was  premedicated with p.o. Valium, IV Versed and Fentanyl.  Her left groin was  prepped and shaved in the usual sterile fashion. Xylocaine 1% was used for  local anesthesia. A 6-French sheath was inserted into the left femoral  artery using standard Seldinger technique. All catheter exchanges were  performed over a long 260 J-wire.  A 6-French right and left diagnostic  catheters as well as a 6-French pigtail catheter were used for selective  coronary angiography, left ventriculography, selective IMA angiography,  selective vein graft angiography, and distal dominant aortography.  Visipaque dye was used for the entirety of the case. Retrograde aortic,  ventricular and pullback pressures were recorded.   HEMODYNAMICS:  1.  The aortic systolic pressure 118, diastolic pressure 59.  2.  Left ventricular systolic pressure 119 and end-diastolic pressure 21.   SELECTIVE CORONARY ANGIOGRAPHY:  1.  Left  main normal.  2.  LAD:  The LAD stent was widely patent.  There was an 80-90% hypodense      lesion just after the stented segment with TIMI 3 flow.  This was a      coupled lesion.  3.  Left circumflex:  A stent in the proximal segment was widely patent.  4.  Right coronary artery:  Totally occluded in its mid segment.  5.  Vein graft to the distal right:  Widely patent.  6.  Vein graft to the diagonal branch:  Widely patent.  7.  LIMA to the LAD:  Atretic.   LEFT VENTRICULOGRAPHY:  RAO left ventriculogram was performed using 25 mL of  Visipaque dye at 12 mL/sec. The overall LVEF is estimated at greater than  60% without focal wall motion abnormalities.   DISTAL DOMINANT AORTOGRAPHY:  A distal dominant aortogram was performed  using 25 mL of Visipaque dye at 12 mL/sec.  The renal arteries were widely  patent.  The aortobifemoral bypass graft was widely patent as well, as were  the distal anastomoses.   IMPRESSION:  Kara Bush has a high-grade lesion in the proximal LAD just  beyond the previously  placed stent.  We will plan to proceed with PCI  stenting using cutting balloon arthrectomy, DES stenting and 2b3a  inhibition.   PROCEDURE DESCRIPTION:  An existing 6-French sheath in the right femoral  artery was exchanged over a wire for a 7-French sheath.  A 6-French sheath  was then inserted into the right femoral vein.  The patient had received  aspirin this morning.  She received an additional 600 mg of p.o. Plavix, a  total of 5000 units of heparin and ACT of 253 and Integrilin double-bolus  infusion.   Using a 7-French 35 dye catheter, along with an 0419 Asahi soft guidewire a  25-10 cutting balloon arthrectomy was performed of the culprit lesion at 6  atmospheres.  Following this a 3-0 x 13 mm long CYPHER drug-eluting stent  was then deployed under angiographic and fluoroscopic control at 15  atmospheres, resulting in reduction of an 80-90% hypodense proximal LAD  lesion to 0%  residual with excellent flow in angiographic appearance.   IMPRESSION:  Successful LAD, PCI stenting using cutting balloon arthrectomy  and jugular vein stenting.  The patient tolerated the procedure well.  The  guidewire and catheter was removed.  The sheaths were adjusted securely in  place.  The patient left the lab in stable condition.   PLAN:  Plans will be to remove the sheath once the PT falls below 150.  Integrilin will be continued for 18 hours.  The patient will be discharged  home in 48 hours.  She remains clinically stable. She left the lab in stable  condition.      JB/MEDQ  D:  09/04/2004  T:  09/04/2004  Job:  [id number]   cc:   Thereasa Solo. Little, M.D.  [address]  [address]  [place]  [place] [address]  Fax: [phone]

## 2011-03-26 ENCOUNTER — Ambulatory Visit (HOSPITAL_COMMUNITY): Admission: RE | Admit: 2011-03-26 | Payer: Medicare Other | Source: Ambulatory Visit | Admitting: Cardiology

## 2011-04-17 ENCOUNTER — Encounter: Payer: Self-pay | Admitting: Family Medicine

## 2011-04-28 ENCOUNTER — Ambulatory Visit (HOSPITAL_COMMUNITY)
Admission: RE | Admit: 2011-04-28 | Discharge: 2011-04-28 | Disposition: A | Discharge status: Home / Self Care | Payer: Medicare Other | Source: Ambulatory Visit | Attending: Physical Medicine and Rehabilitation | Admitting: Physical Medicine and Rehabilitation

## 2011-04-28 ENCOUNTER — Other Ambulatory Visit: Payer: Self-pay | Admitting: Physical Medicine and Rehabilitation

## 2011-04-28 ENCOUNTER — Encounter
Payer: Medicare Other | Attending: Physical Medicine and Rehabilitation | Admitting: Physical Medicine and Rehabilitation

## 2011-04-28 DIAGNOSIS — R52 Pain, unspecified: Secondary | ICD-10-CM

## 2011-04-28 DIAGNOSIS — IMO0002 Reserved for concepts with insufficient information to code with codable children: Secondary | ICD-10-CM | POA: Insufficient documentation

## 2011-04-28 DIAGNOSIS — M542 Cervicalgia: Secondary | ICD-10-CM | POA: Insufficient documentation

## 2011-04-28 DIAGNOSIS — G894 Chronic pain syndrome: Secondary | ICD-10-CM

## 2011-04-28 DIAGNOSIS — M76899 Other specified enthesopathies of unspecified lower limb, excluding foot: Secondary | ICD-10-CM | POA: Insufficient documentation

## 2011-04-28 DIAGNOSIS — M2420 Disorder of ligament, unspecified site: Secondary | ICD-10-CM | POA: Insufficient documentation

## 2011-04-28 DIAGNOSIS — Z951 Presence of aortocoronary bypass graft: Secondary | ICD-10-CM | POA: Insufficient documentation

## 2011-04-28 DIAGNOSIS — I2510 Atherosclerotic heart disease of native coronary artery without angina pectoris: Secondary | ICD-10-CM | POA: Insufficient documentation

## 2011-04-28 DIAGNOSIS — IMO0001 Reserved for inherently not codable concepts without codable children: Secondary | ICD-10-CM | POA: Insufficient documentation

## 2011-04-28 DIAGNOSIS — M5030 Other cervical disc degeneration, unspecified cervical region: Secondary | ICD-10-CM | POA: Insufficient documentation

## 2011-04-28 DIAGNOSIS — M629 Disorder of muscle, unspecified: Secondary | ICD-10-CM | POA: Insufficient documentation

## 2011-04-28 NOTE — Assessment & Plan Note (Signed)
HISTORY:  Kara Bush is a 70 year old woman who was last seen by me for the first time 1 year ago.  She now returns for her followup appointment.  She was referred today by Dr. Corliss Skains.  Kara Bush tells me that she has been diagnosed with fibromyalgia and osteoarthritis and had been trialed on tramadol, however, now is taking, one 5 mg hydrocodone in the morning, 1/2 one at noon and another half one in the evening.  She states that this helps with her parascapular pain which she has had since early 2000.  She also has some other chest type pain which I have asked her to follow up with primary care for.  At the last visit with me 1 year ago she has had some problems with mild trochanteric bursitis as well as some knee pain bilaterally.  She reports that her parascapular pain is around the shoulder blades, it is worst on the right than on the left.  She described as a 5 on a scale of 10, worse in early morning, improves towards in the noon hour and then worsened again as the day goes on.  Denies numbness, tingling, or weakness.  Denies problems with bowel or bladder control.  Denies suicidal ideation.  She reports she does not really sure what makes her pain worse, but it does seem to improve with medication.  She has not been trialed on gabapentin.  Apparently, she has used Cymbalta as well as Lyrica, she did not feel either of these helped that much and she is not sure which doses she has been placed on with these medications.  However, she did not have any adverse reactions to them.  Functional status, she can walk 30 minutes at a time.  She is able to climb stairs and drive.  She does water exercise three times a week. She enjoys playing bridge and spends good amount time on the computer playing games and social networking.  She also enjoys reading her nook and using her eyes pads.  She retired, little over 15 years ago she was in [employer].  Review of systems,  positive for occasional diarrhea and some sleep problems.  Again, she follows up with primary care Dr. Zachery Dauer, for this. She also maintains contact with Dr. Jeani Hawking, her gastroenterologist, and Dr. Clarene Duke, her cardiologist.  She has a past medical history which is significant for history of colitis.  She has a colonoscopy scheduled next week.  She also has had coronary artery disease and is status post abdominal aortic her resection in 1991, coronary artery bypass graft in 1998, coronary artery stents in 2004 and 1005.  She has had a hernia repair and gallbladder removed as well.  She lives independently.  No changes in family history since last visit.  On exam today, blood pressure is 118/54, pulse 76, respirations 14, 98% saturated on room air.  She is a well-developed, well-nourished woman, who does not appear in any distress.  She is oriented x3.  Speech is clear.  Affect is bright.  She is alert, cooperative, and pleasant. Follows commands without difficulty, answers my questions appropriately. Cranial nerves and coordination are intact.  Reflexes are slightly brisk without Hoffmann sign.  She has one to two beats of clonus in the lower extremities.  Her motor strength is good in both upper and lower extremities without focal deficit.  She has slightly decreased vibratory sensation position sense in the lower extremities.  She has decreased sensation to pinprick over  the right C5 dermatome, otherwise sensation to light touch and pinprick is intact.  She transitions easily from sitting to standing.  Her gait is not antalgic.  Tandem gait and Romberg test are performed adequately.  She has slightly decreased range of motion to her neck with rotation to the left and she has some tenderness over the cervical paraspinal musculature and tenderness over the medial scapular border both on the right than on the left, but worse on the right.  She has tenderness over the pes anserine  region in both legs as well.  IMPRESSION: 1. Mild cervicalgia. 2. Bilateral pes anserine bursitis. 3. Mild trochanteric bursitis/iliotibial band syndrome. 4. History of fibromyalgia. 5. Brisk reflexes, etiology not clear.  PLAN:  We will obtain cervical radiographs AP and flexion and extension films.  We will check urine drug screen.  We will consider refilling her hydrocodone 5/325 not more than 2 tablets per day.  Also consider trial of gabapentin.  At this point, she is not interested in pursuing treatment for her pes anserine bursitis or her trochanteric bursitis. Her main issue is her parascapular pain.  She is comfortable with our plan at this time.  I will see her back in 2 weeks.     Brantley Stage, M.D. Electronically Signed    DMK/MedQ D:  04/28/2011 10:28:47  T:  04/28/2011 11:36:07  Job #:  [id number]  cc:   Dr. Corliss Skains

## 2011-05-12 ENCOUNTER — Encounter
Payer: Medicare Other | Attending: Physical Medicine and Rehabilitation | Admitting: Physical Medicine and Rehabilitation

## 2011-05-12 DIAGNOSIS — IMO0001 Reserved for inherently not codable concepts without codable children: Secondary | ICD-10-CM | POA: Insufficient documentation

## 2011-05-12 DIAGNOSIS — IMO0002 Reserved for concepts with insufficient information to code with codable children: Secondary | ICD-10-CM | POA: Insufficient documentation

## 2011-05-12 DIAGNOSIS — M76899 Other specified enthesopathies of unspecified lower limb, excluding foot: Secondary | ICD-10-CM | POA: Insufficient documentation

## 2011-05-12 DIAGNOSIS — M1990 Unspecified osteoarthritis, unspecified site: Secondary | ICD-10-CM | POA: Insufficient documentation

## 2011-05-12 DIAGNOSIS — M542 Cervicalgia: Secondary | ICD-10-CM

## 2011-05-12 DIAGNOSIS — G894 Chronic pain syndrome: Secondary | ICD-10-CM

## 2011-05-12 DIAGNOSIS — Q762 Congenital spondylolisthesis: Secondary | ICD-10-CM | POA: Insufficient documentation

## 2011-05-12 DIAGNOSIS — M5030 Other cervical disc degeneration, unspecified cervical region: Secondary | ICD-10-CM | POA: Insufficient documentation

## 2011-05-13 NOTE — Assessment & Plan Note (Signed)
HISTORY:  Mr. Kara Bush is a pleasant 70 year old woman who was last seen by me April 28, 2011.  She is a referral by Dr. Corliss Skains. She has a long history of fibromyalgia and osteoarthritis, had been trialed on tramadol but finds that hydrocodone helps stay functional.  She is back in today for review of urine drug screen and possible narcotic management of her pain.  At the last visit, cervical flexion and extension views were obtained which showed 3 mm of retrolisthesis on extension at the C5-6 levels. She has decreased disk space at C5-6 as well as C6-7.  The results of these radiographs were reviewed with her today as well.  Her average pain continued at about 3 or 4 on a scale of 10 with medication.  Her pain is predominantly right shoulder parascapular pain, worse with activity, improves significantly with medicines.  FUNCTIONAL STATUS:  She can walk 30 minutes at times.  She is able to climb stairs and drives.  She is independent with self care.  She lives independently.  She reports some constipation with respect to review of systems, otherwise she has been started on some prednisone 40 mg daily for colitis by Dr. Elnoria Howard.  PHYSICAL EXAMINATION:  VITAL SIGNS:  Today, her blood pressure 143/68, pulse 78, respirations 18, and 99% saturated on room air. GENERAL:  She is a well-developed, well-nourished woman who appears in stated age, does not appear in any distress.  She is oriented x3, speech clear, affect is bright, she is alert, cooperative and pleasant. Follows commands without difficulty.  Answers my questions appropriately.  Cranial nerves and coordination are intact.  Reflexes are slightly brisk throughout, 1-2 beats of clonus, Hoffman sign negative.  She reports intact sensation both upper and lower extremities with the exception of the decreased vibratory sensation bilaterally in the lower extremities and position stands in the right lower extremity. Motor  strength is good both upper and lower extremities without focal deficit.  Transition from sitting to standing without difficulty.  Gait is nonantalgic.  She has slight difficulty with tandem gait.  Romberg test is performed adequately.  She has some tenderness over the trochanters bilaterally and some tenderness over the pes anserine region.  IMPRESSION: 1. 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 2. Mild pes anserine bursitis. 3. Mild trochanteric bursitis. 4. History of fibromyalgia. 5. Brisk reflex with etiology not clear.  PLAN:  I have asked her to consider cervical MRI to evaluate for cervical stenosis, narrowing of her spinal canal, she declines this.  We will go ahead and check B12 level on her today.  Her last urine drug screen which was done May 06, 2011 with consistent, nursing staff has reviewed opioid consent.  She would like to proceed with opioid management.  I have reviewed risks and benefits of this with her as well.  She tells me she has done very well on hydrocodone up to 2 tablets each day.  Description has written Norco 5/325 one p.o. q.a.m. half tablet at 3 p.m. and half tablet at 7 p.m., #60, no refills.  I have also suggested TENS unit, she declines this.  She is not interested in physical therapy at this time.  We will see her back in a month.  We will continue to monitor her pill counts, random urine drug screen.  I have answered all of her questions, and she is comfortable with this plan  and we will check B12 at the next visit.     Brantley Stage, M.D. Electronically Signed    DMK/MedQ D:  05/12/2011 12:14:55  T:  05/13/2011 01:26:34  Job #:  [id number]

## 2011-06-09 ENCOUNTER — Encounter
Payer: Medicare Other | Attending: Physical Medicine and Rehabilitation | Admitting: Physical Medicine and Rehabilitation

## 2011-06-09 DIAGNOSIS — M76899 Other specified enthesopathies of unspecified lower limb, excluding foot: Secondary | ICD-10-CM | POA: Insufficient documentation

## 2011-06-09 DIAGNOSIS — M1990 Unspecified osteoarthritis, unspecified site: Secondary | ICD-10-CM | POA: Insufficient documentation

## 2011-06-09 DIAGNOSIS — IMO0002 Reserved for concepts with insufficient information to code with codable children: Secondary | ICD-10-CM | POA: Insufficient documentation

## 2011-06-09 DIAGNOSIS — IMO0001 Reserved for inherently not codable concepts without codable children: Secondary | ICD-10-CM | POA: Insufficient documentation

## 2011-06-09 DIAGNOSIS — M542 Cervicalgia: Secondary | ICD-10-CM

## 2011-06-09 DIAGNOSIS — G894 Chronic pain syndrome: Secondary | ICD-10-CM

## 2011-06-09 DIAGNOSIS — Q762 Congenital spondylolisthesis: Secondary | ICD-10-CM | POA: Insufficient documentation

## 2011-06-09 DIAGNOSIS — M5030 Other cervical disc degeneration, unspecified cervical region: Secondary | ICD-10-CM | POA: Insufficient documentation

## 2011-06-09 NOTE — Assessment & Plan Note (Signed)
HISTORY:  Kara Bush is a pleasant 70 year old woman who is a patient of Dr. Corliss Skains.  Kara Bush has a long history of fibromyalgia and osteoarthritis.  She has been using hydrocodone for many months now through Dr. Corliss Skains.  She had one more prescription of hydrocodone at the last visit, which she used last month.  She is here today for a brief recheck.  She continues to report pain complaints predominantly in the area of the right parascapular region.  Pain is intermittent and aching in nature. It really with some medications does not interfere with her functional status.  Sleep is fair.  Pain improves significantly with medication she reports fairly good relief.  Functional status, she can walk at least 30 minutes.  She is able to climb stairs and drive.  She is independent with self-care.  She lives independently.  She reports that she is planning to take a trip up to [place] tomorrow with her daughter.  No changes in past medical, social, or family history other than recently started on using a tapering dose of prednisone per her gastroenterologist for pancolitis.  She is almost finished with this dose as well.  Medications prescribed through [facility], the patient to start hydrocodone next week.  PHYSICAL EXAMINATION:  VITAL SIGNS:  Blood pressure is 154/66, pulse 75, respirations 16, 97% saturated on room air. GENERAL:  She is a well-developed, well-nourished woman who appears younger than her stated age at 45.  She is oriented x3.  Speech is clear.  Affect is bright.  She is alert, cooperative, and pleasant. Follows commands without difficulty.  Answers my questions appropriately.  Cranial nerves and coordination are intact.  Her reflexes are brisk both upper and lower extremities.  Positive Hoffmann on the left and 1-2 beats of clonus bilaterally.  Her motor strength is good in both upper and lower extremities without focal deficit.  She has no  sensory deficits.  Transitioning from sitting to standing is done with ease.  Gait in the room is normal.  Tandem gait and Romberg test are all performed adequately.  She has limitations in cervical range of motion especially with extension. Tenderness in the parascapular region, especially on the right.  IMPRESSION: 1. 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  She has     degenerative disk disease at C5-6 as well as C6-7. 2. Mild pes anserine bursitis. 3. Mild trochanteric bursitis. 4. Long history of fibromyalgia. 5. Brisk reflexes etiology not clear.  PLAN:  I have reviewed her blood work with her.  She had a B12 done on May 12, 2011 which was 560 with the reference ranges of 211-911.  Urine drug screen done on May 06, 2011 was consistent.  She has an opioid consent on the chart.  I have discussed the findings in her cervical spine from the x-rays once more with her.  She understands she may have a cervical stenosis and narrowing of the central canal with the spinal cord resides.  She declines imaging studies.  At this time, she understands that her brisk reflexes may suggest that there is an narrowing here.  She reports that she is a high functioning and has well managed pain at this time and is not interested in any kind of surgical opinion or further imaging studies at this time.  I have answered all her questions.  She is comfortable with our plan.  We will continue to monitor her pain as well as her opioids pill counts and random urine drug screens.     Brantley Stage, M.D. Electronically Signed    DMK/MedQ D:  06/09/2011 09:07:20  T:  06/09/2011 10:37:19  Job #:  [id number]

## 2011-07-07 ENCOUNTER — Encounter
Payer: Medicare Other | Attending: Physical Medicine and Rehabilitation | Admitting: Physical Medicine and Rehabilitation

## 2011-07-07 DIAGNOSIS — M76899 Other specified enthesopathies of unspecified lower limb, excluding foot: Secondary | ICD-10-CM | POA: Insufficient documentation

## 2011-07-07 DIAGNOSIS — M542 Cervicalgia: Secondary | ICD-10-CM

## 2011-07-07 DIAGNOSIS — M5030 Other cervical disc degeneration, unspecified cervical region: Secondary | ICD-10-CM | POA: Insufficient documentation

## 2011-07-07 DIAGNOSIS — IMO0001 Reserved for inherently not codable concepts without codable children: Secondary | ICD-10-CM

## 2011-07-07 DIAGNOSIS — IMO0002 Reserved for concepts with insufficient information to code with codable children: Secondary | ICD-10-CM | POA: Insufficient documentation

## 2011-07-07 DIAGNOSIS — M47812 Spondylosis without myelopathy or radiculopathy, cervical region: Secondary | ICD-10-CM | POA: Insufficient documentation

## 2011-07-07 DIAGNOSIS — Q762 Congenital spondylolisthesis: Secondary | ICD-10-CM | POA: Insufficient documentation

## 2011-07-08 NOTE — Assessment & Plan Note (Signed)
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  She has been using hydrocodone for several months now.  She finds that she gets fair relief with the medication.  Her average pain is about a 4 on a scale of 10.  Pain is localized to the paracervical region and parascapular area, improves with the use of medication, she gets fair relief.  FUNCTIONAL STATUS:  She can walk 30 minutes.  She is able to climb stairs.  She is driving.  She was able to attend several shows in [place] with her daughter recently.  REVIEW OF SYSTEMS:  Negative for any problems with bowel or bladder. Admits to depression, anxiety, but denies suicidal ideation.  Otherwise, no new problems are noted.  Past medical, social, and family history are unchanged from previous visit.  PHYSICAL EXAMINATION:  Blood pressure is 128/59, pulse 73, respirations 14, 95% saturated on room air.  She is a well-developed, well-nourished, elderly woman who appears a bit younger than her stated age.  She is oriented x3.  Speech is clear.  Affect is bright.  She is alert, cooperative, and pleasant.  Follows commands without difficulty, answers my questions appropriately.  Cranial nerves and coordination are intact. Reflex is slightly brisk in the upper extremities, 2+ in the lower extremities.  No abnormal tone, clonus or tremors are noted.  Motor strength is quite good both upper and lower extremities.  No sensory deficits are appreciated.  Transitioning from sitting to standing is done with ease.  Gait in the room is normal.  Tandem gait and Romberg test are performed adequately. She is able to walk on heels and toes without difficulty. She has some limitations in cervical motion especially with extension and she has some tenderness in the parascapular  region.  IMPRESSION: 1. Cervicalgia with known cervical degenerative disk disease C5-6 with     3 mm of retrolisthesis at the C5-6 level, history of degenerative     disk disease at C6-7 as well. 2. Mild pes anserine bursitis. 3. Mild trochanteric bursitis. 4. Long history of fibromyalgia. 5. Brisk reflexes, etiology not clear.  B12 level had been checked and was in the normal range.  She continues to do water exercises 3 times a week.  She forgot to bring in her pain medication today for monitoring.  I had a long discussion with her about the importance.  We will obtain a farm check as well today as well as urine drug screen.  She tells me that she will make sure she brings in her pill bottles each time she comes in and she understands that she can be discharged if she does not comply with the narcotic agreement.  I will give her a prescription today, hydrocodone 5/325 1 p.o. q.a.m., one half tablet at 3 p.m. and half tablet at 7 p.m. We will see her back next month.  We will continue to monitor her pill counts closely and check urine drug screen at the next visit.     Brantley Stage, M.D. Electronically Signed    DMK/MedQ D:  07/07/2011 13:32:39  T:  07/08/2011 01:26:08  Job #:  [id number]

## 2011-07-31 ENCOUNTER — Other Ambulatory Visit: Payer: Self-pay

## 2011-07-31 ENCOUNTER — Encounter (HOSPITAL_COMMUNITY): Payer: Self-pay | Admitting: Emergency Medicine

## 2011-07-31 ENCOUNTER — Emergency Department (HOSPITAL_COMMUNITY): Payer: Medicare Other

## 2011-07-31 ENCOUNTER — Emergency Department (HOSPITAL_COMMUNITY)
Admission: EM | Admit: 2011-07-31 | Discharge: 2011-08-01 | Disposition: A | Discharge status: Home / Self Care | Payer: Medicare Other | Attending: Emergency Medicine | Admitting: Emergency Medicine

## 2011-07-31 DIAGNOSIS — G9332 Myalgic encephalomyelitis/chronic fatigue syndrome: Secondary | ICD-10-CM | POA: Diagnosis present

## 2011-07-31 DIAGNOSIS — I2510 Atherosclerotic heart disease of native coronary artery without angina pectoris: Secondary | ICD-10-CM | POA: Insufficient documentation

## 2011-07-31 DIAGNOSIS — R079 Chest pain, unspecified: Secondary | ICD-10-CM | POA: Insufficient documentation

## 2011-07-31 DIAGNOSIS — M549 Dorsalgia, unspecified: Secondary | ICD-10-CM | POA: Insufficient documentation

## 2011-07-31 DIAGNOSIS — M797 Fibromyalgia: Secondary | ICD-10-CM | POA: Diagnosis present

## 2011-07-31 DIAGNOSIS — I10 Essential (primary) hypertension: Secondary | ICD-10-CM | POA: Diagnosis present

## 2011-07-31 DIAGNOSIS — Z951 Presence of aortocoronary bypass graft: Secondary | ICD-10-CM | POA: Insufficient documentation

## 2011-07-31 DIAGNOSIS — E785 Hyperlipidemia, unspecified: Secondary | ICD-10-CM | POA: Diagnosis not present

## 2011-07-31 DIAGNOSIS — R911 Solitary pulmonary nodule: Secondary | ICD-10-CM | POA: Insufficient documentation

## 2011-07-31 DIAGNOSIS — Z79899 Other long term (current) drug therapy: Secondary | ICD-10-CM | POA: Insufficient documentation

## 2011-07-31 DIAGNOSIS — R1013 Epigastric pain: Secondary | ICD-10-CM | POA: Insufficient documentation

## 2011-07-31 DIAGNOSIS — Z9861 Coronary angioplasty status: Secondary | ICD-10-CM | POA: Insufficient documentation

## 2011-07-31 DIAGNOSIS — R110 Nausea: Secondary | ICD-10-CM | POA: Insufficient documentation

## 2011-07-31 DIAGNOSIS — R0789 Other chest pain: Secondary | ICD-10-CM | POA: Diagnosis present

## 2011-07-31 DIAGNOSIS — K5190 Ulcerative colitis, unspecified, without complications: Secondary | ICD-10-CM | POA: Clinically undetermined

## 2011-07-31 HISTORY — DX: Personal history of other diseases of the circulatory system: Z86.79

## 2011-07-31 HISTORY — DX: Myalgic encephalomyelitis/chronic fatigue syndrome: G93.32

## 2011-07-31 HISTORY — DX: Peripheral vascular disease, unspecified: I73.9

## 2011-07-31 HISTORY — DX: Other chest pain: R07.89

## 2011-07-31 HISTORY — DX: Chronic fatigue, unspecified: R53.82

## 2011-07-31 HISTORY — DX: Myalgic encephalomyelitis/chronic fatigue syndrome: M79.7

## 2011-07-31 HISTORY — DX: Obstructive sleep apnea (adult) (pediatric): G47.33

## 2011-07-31 HISTORY — DX: Personal history of other diseases of the circulatory system: Z98.890

## 2011-07-31 LAB — DIFFERENTIAL
Basophils Relative: 0 % (ref 0–1)
Eosinophils Absolute: 0.1 10*3/uL (ref 0.0–0.7)
Eosinophils Relative: 1 % (ref 0–5)
Lymphs Abs: 1.8 10*3/uL (ref 0.7–4.0)
Monocytes Absolute: 1.2 10*3/uL — ABNORMAL HIGH (ref 0.1–1.0)
Neutrophils Relative %: 78 % — ABNORMAL HIGH (ref 43–77)

## 2011-07-31 LAB — CBC
Hemoglobin: 11.9 g/dL — ABNORMAL LOW (ref 12.0–15.0)
Platelets: 276 10*3/uL (ref 150–400)
RBC: 4.65 MIL/uL (ref 3.87–5.11)
RDW: 14.3 % (ref 11.5–15.5)

## 2011-07-31 LAB — POCT I-STAT, CHEM 8
Chloride: 104 mEq/L (ref 96–112)
Glucose, Bld: 116 mg/dL — ABNORMAL HIGH (ref 70–99)
HCT: 39 % (ref 36.0–46.0)
Hemoglobin: 13.3 g/dL (ref 12.0–15.0)
Potassium: 4 mEq/L (ref 3.5–5.1)
Sodium: 139 mEq/L (ref 135–145)

## 2011-07-31 LAB — POCT I-STAT TROPONIN I: Troponin i, poc: 0 ng/mL (ref 0.00–0.08)

## 2011-07-31 NOTE — ED Notes (Signed)
Pt alert, c/o chest pain, right sided, radiating to left chest wall, no changes with inspiration, c/o nausea, no emesis, skin pwd, resp even unlabored, contacted PCP, instructed "to busy to eval", pt has upcoming travel plans

## 2011-08-01 ENCOUNTER — Encounter (HOSPITAL_COMMUNITY): Payer: Self-pay | Admitting: Internal Medicine

## 2011-08-01 ENCOUNTER — Emergency Department (HOSPITAL_COMMUNITY): Payer: Medicare Other

## 2011-08-01 DIAGNOSIS — R0789 Other chest pain: Secondary | ICD-10-CM | POA: Diagnosis present

## 2011-08-01 DIAGNOSIS — G9332 Myalgic encephalomyelitis/chronic fatigue syndrome: Secondary | ICD-10-CM | POA: Diagnosis present

## 2011-08-01 DIAGNOSIS — M797 Fibromyalgia: Secondary | ICD-10-CM | POA: Diagnosis present

## 2011-08-01 DIAGNOSIS — G4733 Obstructive sleep apnea (adult) (pediatric): Secondary | ICD-10-CM | POA: Insufficient documentation

## 2011-08-01 DIAGNOSIS — E785 Hyperlipidemia, unspecified: Secondary | ICD-10-CM | POA: Diagnosis not present

## 2011-08-01 DIAGNOSIS — I739 Peripheral vascular disease, unspecified: Secondary | ICD-10-CM | POA: Insufficient documentation

## 2011-08-01 DIAGNOSIS — Z8679 Personal history of other diseases of the circulatory system: Secondary | ICD-10-CM | POA: Insufficient documentation

## 2011-08-01 DIAGNOSIS — I2510 Atherosclerotic heart disease of native coronary artery without angina pectoris: Secondary | ICD-10-CM | POA: Diagnosis present

## 2011-08-01 DIAGNOSIS — I10 Essential (primary) hypertension: Secondary | ICD-10-CM | POA: Diagnosis present

## 2011-08-01 DIAGNOSIS — K5190 Ulcerative colitis, unspecified, without complications: Secondary | ICD-10-CM | POA: Clinically undetermined

## 2011-08-01 DIAGNOSIS — F32A Depression, unspecified: Secondary | ICD-10-CM | POA: Insufficient documentation

## 2011-08-01 DIAGNOSIS — F329 Major depressive disorder, single episode, unspecified: Secondary | ICD-10-CM | POA: Insufficient documentation

## 2011-08-01 LAB — HEPATIC FUNCTION PANEL
Albumin: 3.7 g/dL (ref 3.5–5.2)
Alkaline Phosphatase: 80 U/L (ref 39–117)
Total Bilirubin: 0.1 mg/dL — ABNORMAL LOW (ref 0.3–1.2)
Total Protein: 7.4 g/dL (ref 6.0–8.3)

## 2011-08-01 LAB — LIPASE, BLOOD: Lipase: 32 U/L (ref 11–59)

## 2011-08-01 LAB — POCT I-STAT TROPONIN I: Troponin i, poc: 0 ng/mL (ref 0.00–0.08)

## 2011-08-01 MED ORDER — FENTANYL CITRATE 0.05 MG/ML IJ SOLN
100.0000 ug | Freq: Once | INTRAMUSCULAR | Status: AC
  Administered 2011-08-01: 100 ug via INTRAVENOUS
  Filled 2011-08-01: qty 2

## 2011-08-01 MED ORDER — IOHEXOL 300 MG/ML  SOLN
100.0000 mL | Freq: Once | INTRAMUSCULAR | Status: AC | PRN
  Administered 2011-08-01: 100 mL via INTRAVENOUS

## 2011-08-01 MED ORDER — HYDROMORPHONE HCL PF 1 MG/ML IJ SOLN
1.0000 mg | Freq: Once | INTRAMUSCULAR | Status: AC
  Administered 2011-08-01: 1 mg via INTRAVENOUS
  Filled 2011-08-01: qty 1

## 2011-08-01 MED ORDER — SUCRALFATE 1 GM/10ML PO SUSP: 1.0000 g | Freq: Four times a day (QID) | ORAL | Status: AC

## 2011-08-01 MED ORDER — HYDROCODONE-ACETAMINOPHEN 5-500 MG PO TABS: 2.0000 | ORAL_TABLET | Freq: Four times a day (QID) | ORAL | Status: DC | PRN

## 2011-08-01 NOTE — ED Notes (Signed)
Returned from CT.

## 2011-08-01 NOTE — ED Notes (Signed)
Palumbo, MD at bedside. Plan of care discussed with patient.

## 2011-08-01 NOTE — ED Notes (Signed)
Hilty, MD at bedside 

## 2011-08-01 NOTE — ED Notes (Signed)
EDP in room explaining plan of care to patient. Discharge instruction given by the EDP. EDP inform patient that she need to call her PCP about her lung nodule and pt informed that she need to have a CT scan done. Pain prescription offered to patient but patient refused stating that she is in pain clinic.

## 2011-08-01 NOTE — ED Provider Notes (Addendum)
History     CSN: [id number]  Arrival date & time 07/31/11  2201   First MD Initiated Contact with Patient 07/31/11 2320      Chief Complaint  Patient presents with  . Chest Pain    (Consider location/radiation/quality/duration/timing/severity/associated sxs/prior treatment) Patient is a 70 y.o. female presenting with chest pain and abdominal pain. The history is provided by the patient. No language interpreter was used.  Chest Pain The chest pain began 3 - 5 days ago. Chest pain occurs intermittently. The chest pain is unchanged. At its most intense, the pain is at 10/10. The pain is currently at 9/10. The quality of the pain is described as dull. Radiates to: right sided chest pain that radiates to the left chest  Exacerbated by: nothing. Primary symptoms include nausea. Pertinent negatives for primary symptoms include no fever, no wheezing, no abdominal pain, no vomiting and no altered mental status.  Pertinent negatives for associated symptoms include no diaphoresis, no lower extremity edema, no near-syncope, no numbness, no orthopnea, no paroxysmal nocturnal dyspnea and no weakness. She tried nothing for the symptoms. Risk factors include being elderly.  Her past medical history is significant for CAD, hyperlipidemia and hypertension.  Pertinent negatives for past medical history include Turner syndrome.  Pertinent negatives for family medical history include: no Marfan's syndrome in family.  Procedure history is positive for cardiac catheterization and echocardiogram.    Abdominal Pain The primary symptoms of the illness include nausea. The primary symptoms of the illness do not include abdominal pain, fever, vomiting or diarrhea. The current episode started 3 to 5 hours ago. The onset of the illness was gradual. The problem has not changed since onset. Associated with: histroy of Ulcerative colitis. The patient states that she believes she is currently not pregnant. Risk factors for  an acute abdominal problem include being elderly. Additional symptoms associated with the illness include back pain. Symptoms associated with the illness do not include diaphoresis or heartburn. Significant associated medical issues include inflammatory bowel disease and cardiac disease.  Patient originally stated her pain was in the chest but now states the majority is in the abdomen and upper back. Given a dose of fentanyl and reexamined and pain only 1-2 points improved in pain score.  No f/c/r.   Past Medical History  Diagnosis Date  . Hypertension   . CAD (coronary artery disease)   . Depression   . Ulcerative colitis   . Hyperlipidemia     Past Surgical History  Procedure Date  . Hernia repair   . Tubal ligation     BTL  . Coronary artery bypass graft   . Coronary stent placement     Family History  Problem Relation Age of Onset  . Heart disease Father   . Cancer Paternal Aunt     BREAST    History  Substance Use Topics  . Smoking status: Not on file  . Smokeless tobacco: Not on file  . Alcohol Use:     OB History    Grav Para Term Preterm Abortions TAB SAB Ect Mult Living                  Review of Systems  Constitutional: Negative for fever and diaphoresis.  HENT: Negative for facial swelling.   Eyes: Negative for discharge.  Respiratory: Negative for wheezing.   Cardiovascular: Positive for chest pain. Negative for orthopnea and near-syncope.  Gastrointestinal: Positive for nausea. Negative for heartburn, vomiting, abdominal pain, diarrhea and abdominal  distention.  Genitourinary: Negative for difficulty urinating.  Musculoskeletal: Positive for back pain. Negative for arthralgias.  Skin: Negative.   Neurological: Negative for weakness and numbness.  Hematological: Negative.   Psychiatric/Behavioral: Negative.  Negative for altered mental status.    Allergies  Penicillins  Home Medications   Current Outpatient Rx  Name Route Sig Dispense Refill    . ASPIRIN 81 MG PO TABS Oral Take 81-324 mg by mouth daily.     . ATENOLOL 25 MG PO TABS Oral Take 12.5 mg by mouth 2 (two) times daily.      Marland Kitchen CALCIUM + D PO Oral Take 1 tablet by mouth daily.      Marland Kitchen CITALOPRAM HYDROBROMIDE 20 MG PO TABS Oral Take 20 mg by mouth daily.      Marland Kitchen EZETIMIBE 10 MG PO TABS Oral Take 10 mg by mouth daily.      . OMEGA-3 FATTY ACIDS 1000 MG PO CAPS Oral Take 1 g by mouth 2 (two) times daily.      Marland Kitchen HYDROCODONE-ACETAMINOPHEN 5-325 MG PO TABS Oral Take 0.5-1 tablets by mouth every 6 (six) hours as needed. For pain.    Marland Kitchen LISINOPRIL 10 MG PO TABS Oral Take 10 mg by mouth daily.      Marland Kitchen MESALAMINE 800 MG PO TBEC Oral Take 2 tablets by mouth 2 (two) times daily.      . ADULT MULTIVITAMIN W/MINERALS CH Oral Take 1 tablet by mouth daily.      Marland Kitchen NAPROXEN SODIUM 220 MG PO TABS Oral Take 220 mg by mouth 2 (two) times daily as needed. For pain.     Marland Kitchen VITAMIN B-6 25 MG PO TABS Oral Take 25 mg by mouth daily.      Marland Kitchen SIMVASTATIN 40 MG PO TABS Oral Take 40 mg by mouth at bedtime.      . SULFAMETHOXAZOLE-TRIMETHOPRIM 800-160 MG PO TABS Oral Take 1 tablet by mouth 2 (two) times daily. 7 day course of therapy; completed.    Marland Kitchen ZOLPIDEM TARTRATE 10 MG PO TABS Oral Take 5 mg by mouth at bedtime as needed. For sleep.      BP 125/51  Pulse 88  Temp(Src) 98 F (36.7 C) (Oral)  Resp 14  Wt 170 lb (77.111 kg)  SpO2 100%  Physical Exam  Constitutional: She appears well-developed and well-nourished. No distress.  HENT:  Head: Normocephalic and atraumatic.  Mouth/Throat: Oropharynx is clear and moist.  Eyes: Conjunctivae are normal. Pupils are equal, round, and reactive to light.  Neck: Normal range of motion. Neck supple.  Cardiovascular: Normal rate and regular rhythm.   Pulmonary/Chest: She has no wheezes. She has no rales.  Abdominal: Soft. Bowel sounds are normal. There is tenderness in the epigastric area. There is no rebound and no guarding.  Musculoskeletal: Normal range of  motion. She exhibits no edema.  Neurological: She is alert.  Skin: Skin is warm and dry.  Psychiatric: Thought content normal.    ED Course  Procedures (including critical care time)  Labs Reviewed  CBC - Abnormal; Notable for the following:    WBC 14.1 (*)    Hemoglobin 11.9 (*)    MCH 25.6 (*)    All other components within normal limits  DIFFERENTIAL - Abnormal; Notable for the following:    Neutrophils Relative 78 (*)    Neutro Abs 11.0 (*)    Monocytes Absolute 1.2 (*)    All other components within normal limits  POCT I-STAT, CHEM 8 -  Abnormal; Notable for the following:    BUN 33 (*)    Creatinine, Ser 1.50 (*)    Glucose, Bld 116 (*)    All other components within normal limits  POCT I-STAT TROPONIN I  I-STAT TROPONIN I  I-STAT, CHEM 8   Dg Chest 2 View  07/31/2011  *RADIOLOGY REPORT*  Clinical Data: Shortness of breath  CHEST - 2 VIEW  Comparison: 07/30/2006  Findings: Prior median sternotomy and CABG procedure.  The heart size is normal.  9 mm nodule identified within the right upper lobe is more conspicuous than on the previous exam.  Indeterminate.  No airspace consolidation identified.  Visualized osseous structures are intact.  IMPRESSION:  1.  No acute findings identified. 2.  Indeterminant nodule in the right upper lobe measures approximately 9 mm.  Suggest follow-up noncontrast CT of the chest for more specific assessment of this structure.  Original Report Authenticated By: Rosealee Albee, M.D.     1. Chest pain   2. Incidental lung nodule, greater than or equal to 8mm       MDM   Date: 08/01/2011  Rate: 101  Rhythm: sinus tachycardia  QRS Axis: normal  Intervals: normal  ST/T Wave abnormalities: nonspecific ST changes  Conduction Disutrbances:none  Narrative Interpretation:   Old EKG Reviewed: changes noted  Witnessed to have been in patient's room for history and physical by Aurther Loft charge nurse and Drenda Freeze patient's nurse  Seen and reexamined  and pain reevaluated with patient's nurse present x 2   MDM Reviewed: vitals Interpretation: labs, ECG and x-ray Consults: cardiology    657 ct results reviewed with the patient, who is reassured by the CT scan and lab results but wants to know what cardiology's plan is for her chest pain which she said was her primary concern.   804 Case d/w Dr. Herbie Baltimore of cardiology.  2 negative sets of enzymes ok to discharge and follow up in the office for stress test  814 Patient informed with patient's nurse Mosetta Putt in room of need to call doctor for outpatient stress test and to see family physician Dr. Zachery Dauer for outpatient chest CT scan to better assess the pulmonary nodule seen on Chest Xray.  Patient verbalizes understanding and agrees to follow up for stress test and and for outpatient chest CT scan.  Patient also informed MD she cannot have pain medications as she sees pain management and they do pill counts and testing.  Will prescribed another class of medication   Aldea Avis K Kiosha Buchan-Rasch, MD 08/01/11 9147  Jessamine Barcia K Tishana Clinkenbeard-Rasch, MD 08/01/11 [phone]

## 2011-08-01 NOTE — ED Notes (Signed)
Patient re-examined by Dr. Nicanor Alcon. Patient states that pain is "down 2 points". Abdomen tender to palpation when palpated by Dr. Nicanor Alcon.

## 2011-08-01 NOTE — ED Notes (Signed)
Report received from night RN. First contact with patient. Pt is alert, oriented. Reports dull chest pain x months on and off. sts she has been seem by our EDP. Also reports abd pain only at palpation.

## 2011-08-01 NOTE — Consult Note (Signed)
[facility]       CONSULTATION NOTE   Reason for Consult: Chest pain  Requesting Physician: Dr. Cy Blamer  HPI: This is a 70 y.o. female with a past medical history significant for aorta disease status post CABG in 1998 and stents in 2005, 2006 in 2007. She had a negative nuclear study in December of 2010 with an ejection fraction of 82%. She also has fibromyalgia and chronic fatigue as well as ulcerative colitis. She was recently seen by Dr. Clarene Duke in our office for usual with chest wall pain symptoms. Workup has been unrevealing in the recent past. She also has a history of peripheral Josie is having an aortoiliac bypass by Dr. Hart Rochester in 1991, partial aortic resection and bilateral carotid disease. Per her report she was preparing to go out of town on Saturday and has noticed increasing pain across her upper abdomen which radiates to both sides of the abdominal cavity underneath the rib cage. She also reports pain along the paraspinal muscles by the right scapula. In addition there is a pain along the left costal margin which is focal and of pinpoint as well as reproducible. She is concerned of cardiac ischemia and presented for evaluation of these symptoms. We're consulted for admission for possible unstable angina. PLEASE NOTE: The patient reports that she was not physically examined by the ER physician prior to the consult request and my arrival.  PMHx:  Past Medical History  Diagnosis Date  . Hypertension   . CAD (coronary artery disease)   . Depression   . Ulcerative colitis   . Hyperlipidemia   . Chronic fatigue fibromyalgia syndrome   . OSA (obstructive sleep apnea)   . PAD (peripheral artery disease)   . S/P resection of aortic aneurysm   . Chest wall pain    Past Surgical History  Procedure Date  . Hernia repair   . Tubal ligation     BTL  . Coronary artery bypass graft   . Coronary stent placement     FAMHx: Family History    Problem Relation Age of Onset  . Heart disease Father   . Cancer Paternal Aunt     BREAST    SOCHx:  does not have a smoking history on file. She does not have any smokeless tobacco history on file. Her alcohol and drug histories not on file.  ALLERGIES: Allergies  Allergen Reactions  . Penicillins Rash    ROS: A comprehensive review of systems was negative except for: Constitutional: positive for fatigue Gastrointestinal: positive for abdominal pain, episode of vomiting Genitourinary: positive for recent UTI Musculoskeletal: positive for back pain and myalgias Behavioral/Psych: positive for anxiety  HOME MEDICATIONS:  (Not in a hospital admission)  HOSPITAL MEDICATIONS: 1. Atenolol 25 mg, one half tablet twice daily 2. Aspirin 81 mg 3.  Ambien 5 mg each bedtime when necessary  4.  calcium 5. Multivitamin  6.  fish oil 1200 mg twice a day 7. Lisinopril 5 mg daily 8. Naproxen 220 mg twice a day 9. Tramadol 50 mg when necessary 10. Cymbalta 60 mg daily 11. Simvastatin 40 mg daily 12. Zetia 10 mg daily 13. Balsalazide 750 mg - 3 tablets 3 times a day  VITALS: Blood pressure 125/51, pulse 88, temperature 98 F (36.7 C), temperature source Oral, resp. rate 14, weight 77.111 kg (170 lb), SpO2 100.00%.  PHYSICAL EXAM: General appearance: alert and no distress Neck: no adenopathy, no carotid bruit, no JVD,  supple, symmetrical, trachea midline and thyroid not enlarged, symmetric, no tenderness/mass/nodules Lungs: clear to auscultation bilaterally Heart: regular rate and rhythm, S1, S2 normal, no murmur, click, rub or gallop Abdomen: Tenderness to palpation over the midepigastrium, no rebound, mild guarding, no Murphy's sign, s/p cholesystectomy Extremities: pain on palpation over the left parasternal margin, pain on palpation over the right back paraspinal area near the right scapula Pulses: 2+ and symmetric Skin: Skin color, texture, turgor normal. No rashes or  lesions Neurologic: Grossly normal  LABS: Results for orders placed during the hospital encounter of 07/31/11 (from the past 48 hour(s))  CBC     Status: Abnormal   Collection Time   07/31/11 11:01 PM      Component Value Range Comment   WBC 14.1 (*) 4.0 - 10.5 (K/uL)    RBC 4.65  3.87 - 5.11 (MIL/uL)    Hemoglobin 11.9 (*) 12.0 - 15.0 (g/dL)    HCT 16.1  09.6 - 04.5 (%)    MCV 79.1  78.0 - 100.0 (fL)    MCH 25.6 (*) 26.0 - 34.0 (pg)    MCHC 32.3  30.0 - 36.0 (g/dL)    RDW 40.9  81.1 - 91.4 (%)    Platelets 276  150 - 400 (K/uL)   DIFFERENTIAL     Status: Abnormal   Collection Time   07/31/11 11:01 PM      Component Value Range Comment   Neutrophils Relative 78 (*) 43 - 77 (%)    Neutro Abs 11.0 (*) 1.7 - 7.7 (K/uL)    Lymphocytes Relative 13  12 - 46 (%)    Lymphs Abs 1.8  0.7 - 4.0 (K/uL)    Monocytes Relative 8  3 - 12 (%)    Monocytes Absolute 1.2 (*) 0.1 - 1.0 (K/uL)    Eosinophils Relative 1  0 - 5 (%)    Eosinophils Absolute 0.1  0.0 - 0.7 (K/uL)    Basophils Relative 0  0 - 1 (%)    Basophils Absolute 0.0  0.0 - 0.1 (K/uL)   POCT I-STAT, CHEM 8     Status: Abnormal   Collection Time   07/31/11 11:09 PM      Component Value Range Comment   Sodium 139  135 - 145 (mEq/L)    Potassium 4.0  3.5 - 5.1 (mEq/L)    Chloride 104  96 - 112 (mEq/L)    BUN 33 (*) 6 - 23 (mg/dL)    Creatinine, Ser 7.82 (*) 0.50 - 1.10 (mg/dL)    Glucose, Bld 956 (*) 70 - 99 (mg/dL)    Calcium, Ion 2.13  1.12 - 1.32 (mmol/L)    TCO2 25  0 - 100 (mmol/L)    Hemoglobin 13.3  12.0 - 15.0 (g/dL)    HCT 08.6  57.8 - 46.9 (%)   POCT I-STAT TROPONIN I     Status: Normal   Collection Time   07/31/11 11:09 PM      Component Value Range Comment   Troponin i, poc 0.00  0.00 - 0.08 (ng/mL)    Comment 3              IMAGING: Dg Chest 2 View  07/31/2011  *RADIOLOGY REPORT*  Clinical Data: Shortness of breath  CHEST - 2 VIEW  Comparison: 07/30/2006  Findings: Prior median sternotomy and CABG  procedure.  The heart size is normal.  9 mm nodule identified within the right upper lobe is more conspicuous than on the  previous exam.  Indeterminate.  No airspace consolidation identified.  Visualized osseous structures are intact.  IMPRESSION:  1.  No acute findings identified. 2.  Indeterminant nodule in the right upper lobe measures approximately 9 mm.  Suggest follow-up noncontrast CT of the chest for more specific assessment of this structure.  Original Report Authenticated By: Rosealee Albee, M.D.   EKG:  Sinus tachycardia at 101 with nonspecific T-wave changes   IMPRESSION: 1. Upper mid-epigastric pain, possibly pancreatitis versus flare of ulcerative colitis 2. Musculoskeletal chest wall pain 3. Leukocytosis  RECOMMENDATION: 1. Further workup for her upper abdominal pain, leukocytosis, episode of vomiting and related symptoms. I would recommend an abdominal ultrasound or CT scan, LFTs and pancreatic enzymes, and possible hospital medicine evaluation. No further cardiac workup is necessary at this time.  2. I will schedule follow-up for her in our office with Dr. Clarene Duke.   Time Spent Directly with Patient: 45 minutes  Chrystie Nose, MD Attending Cardiologist [facility]  Kamoni Gentles C 08/01/2011, 1:33 AM

## 2011-08-01 NOTE — ED Notes (Signed)
Patient ambulated to bathroom; gait steady. Patient rates pain 3 on 0-10 scale.

## 2011-08-01 NOTE — ED Notes (Signed)
Patient states that she has finished drinking her contrast for CT scan.

## 2011-08-01 NOTE — ED Notes (Signed)
Patient transported to CT 

## 2011-08-01 NOTE — ED Notes (Addendum)
Emergency Physician DR. Palumbo in room discussing plan of care with patient.

## 2011-08-01 NOTE — ED Notes (Signed)
CT tech at bedside to administer oral contrast for CT.

## 2011-08-04 ENCOUNTER — Other Ambulatory Visit: Payer: Self-pay | Admitting: Family Medicine

## 2011-08-04 DIAGNOSIS — R911 Solitary pulmonary nodule: Secondary | ICD-10-CM

## 2011-08-06 ENCOUNTER — Ambulatory Visit
Admission: RE | Admit: 2011-08-06 | Discharge: 2011-08-06 | Disposition: A | Discharge status: Home / Self Care | Payer: Medicare Other | Source: Ambulatory Visit | Attending: Family Medicine | Admitting: Family Medicine

## 2011-08-06 DIAGNOSIS — J984 Other disorders of lung: Secondary | ICD-10-CM | POA: Diagnosis not present

## 2011-08-06 DIAGNOSIS — R911 Solitary pulmonary nodule: Secondary | ICD-10-CM

## 2011-08-07 DIAGNOSIS — R079 Chest pain, unspecified: Secondary | ICD-10-CM | POA: Diagnosis not present

## 2011-08-07 DIAGNOSIS — I2510 Atherosclerotic heart disease of native coronary artery without angina pectoris: Secondary | ICD-10-CM | POA: Diagnosis not present

## 2011-08-07 DIAGNOSIS — I739 Peripheral vascular disease, unspecified: Secondary | ICD-10-CM | POA: Diagnosis not present

## 2011-08-07 DIAGNOSIS — Z9289 Personal history of other medical treatment: Secondary | ICD-10-CM

## 2011-08-07 DIAGNOSIS — I10 Essential (primary) hypertension: Secondary | ICD-10-CM | POA: Diagnosis not present

## 2011-08-07 HISTORY — DX: Personal history of other medical treatment: Z92.89

## 2011-08-11 DIAGNOSIS — K5190 Ulcerative colitis, unspecified, without complications: Secondary | ICD-10-CM | POA: Diagnosis not present

## 2011-08-13 DIAGNOSIS — R7989 Other specified abnormal findings of blood chemistry: Secondary | ICD-10-CM | POA: Diagnosis not present

## 2011-08-18 DIAGNOSIS — R079 Chest pain, unspecified: Secondary | ICD-10-CM | POA: Diagnosis not present

## 2011-08-18 DIAGNOSIS — I2510 Atherosclerotic heart disease of native coronary artery without angina pectoris: Secondary | ICD-10-CM | POA: Diagnosis not present

## 2011-08-19 ENCOUNTER — Encounter: Payer: Medicare Other | Attending: Neurosurgery | Admitting: Neurosurgery

## 2011-08-19 DIAGNOSIS — IMO0001 Reserved for inherently not codable concepts without codable children: Secondary | ICD-10-CM | POA: Diagnosis not present

## 2011-08-19 DIAGNOSIS — M76899 Other specified enthesopathies of unspecified lower limb, excluding foot: Secondary | ICD-10-CM | POA: Diagnosis not present

## 2011-08-19 DIAGNOSIS — M542 Cervicalgia: Secondary | ICD-10-CM | POA: Insufficient documentation

## 2011-08-19 DIAGNOSIS — IMO0002 Reserved for concepts with insufficient information to code with codable children: Secondary | ICD-10-CM | POA: Diagnosis not present

## 2011-08-20 DIAGNOSIS — D72829 Elevated white blood cell count, unspecified: Secondary | ICD-10-CM | POA: Diagnosis not present

## 2011-08-20 DIAGNOSIS — R82998 Other abnormal findings in urine: Secondary | ICD-10-CM | POA: Diagnosis not present

## 2011-08-20 NOTE — Assessment & Plan Note (Signed)
This patient of Dr. Pamelia Hoit seen for interscapular pain, history of cervicalgia and trochanter bursitis as well as fibromyalgia.  Average pain is 3-4.  It is constant pain.  General activity level is 0-3.  She states the pain varies.  Sleep patterns are fair.  Medication tends to help.  Mobility she walks without assistance.  She can walk up to 30 minutes at a time.  She climb steps and drive.  Functionally, she is retired.  REVIEW OF SYSTEMS:  Notable for the difficulties described above as well as some bladder control issues.  No suicidal thoughts.  Last UDS consistent.  She did not bring her pill bottle in for count today.  She was counseled against that.  She was told also that she would not get a refill until she brings the bottle in for count and that the count is correct.  PAST MEDICAL HISTORY, SOCIAL HISTORY AND FAMILY HISTORY:  Unchanged.  PHYSICAL EXAMINATION:  VITAL SIGNS:  Her blood pressure is 159/81, pulse 76, respirations 18 and O2 sats 96 on room air.  EXTREMITIES:  Motor strength and sensation intact. NEUROLOGIC:  Constitutionally, she is within normal limits.  She is alert and oriented x3.  She has normal gait.  IMPRESSION: 1. Cervicalgia. 2. History of pes bursitis. 3. Trochanteric bursitis. 4. Fibromyalgia.  PLAN:  Prescription on the chart.  Hydrocodone 5/325, 1 every morning, 1.5 at 3 p.m. and 7 p.m. 60 with no refill.  She brings her pill bottles about for a correct count.  She did not receive the prescription and she will follow up with Dr. Pamelia Hoit in 1 month.     Yousef Huge L. Blima Dessert Electronically Signed    RLW/MedQ D:  08/19/2011 11:19:50  T:  08/20/2011 02:33:56  Job #:  [id number]

## 2011-08-22 ENCOUNTER — Ambulatory Visit: Payer: Medicare Other | Admitting: Physical Medicine and Rehabilitation

## 2011-08-28 DIAGNOSIS — R197 Diarrhea, unspecified: Secondary | ICD-10-CM | POA: Diagnosis not present

## 2011-09-04 DIAGNOSIS — H60399 Other infective otitis externa, unspecified ear: Secondary | ICD-10-CM | POA: Diagnosis not present

## 2011-09-04 DIAGNOSIS — H6120 Impacted cerumen, unspecified ear: Secondary | ICD-10-CM | POA: Diagnosis not present

## 2011-09-12 DIAGNOSIS — Z136 Encounter for screening for cardiovascular disorders: Secondary | ICD-10-CM | POA: Diagnosis not present

## 2011-09-12 DIAGNOSIS — Z0000 Encounter for general adult medical examination without abnormal findings: Secondary | ICD-10-CM | POA: Diagnosis not present

## 2011-09-17 ENCOUNTER — Ambulatory Visit (HOSPITAL_COMMUNITY)
Admission: RE | Admit: 2011-09-17 | Discharge: 2011-09-17 | Disposition: A | Discharge status: Home / Self Care | Payer: Medicare Other | Source: Ambulatory Visit | Attending: Physical Medicine and Rehabilitation | Admitting: Physical Medicine and Rehabilitation

## 2011-09-17 ENCOUNTER — Encounter
Payer: Medicare Other | Attending: Physical Medicine and Rehabilitation | Admitting: Physical Medicine and Rehabilitation

## 2011-09-17 ENCOUNTER — Ambulatory Visit: Payer: Medicare Other | Admitting: Physical Medicine and Rehabilitation

## 2011-09-17 ENCOUNTER — Other Ambulatory Visit: Payer: Self-pay | Admitting: Physical Medicine and Rehabilitation

## 2011-09-17 DIAGNOSIS — M5030 Other cervical disc degeneration, unspecified cervical region: Secondary | ICD-10-CM | POA: Diagnosis not present

## 2011-09-17 DIAGNOSIS — M76899 Other specified enthesopathies of unspecified lower limb, excluding foot: Secondary | ICD-10-CM | POA: Insufficient documentation

## 2011-09-17 DIAGNOSIS — IMO0002 Reserved for concepts with insufficient information to code with codable children: Secondary | ICD-10-CM | POA: Insufficient documentation

## 2011-09-17 DIAGNOSIS — M47812 Spondylosis without myelopathy or radiculopathy, cervical region: Secondary | ICD-10-CM | POA: Insufficient documentation

## 2011-09-17 DIAGNOSIS — IMO0001 Reserved for inherently not codable concepts without codable children: Secondary | ICD-10-CM | POA: Insufficient documentation

## 2011-09-17 DIAGNOSIS — G894 Chronic pain syndrome: Secondary | ICD-10-CM | POA: Diagnosis not present

## 2011-09-17 DIAGNOSIS — M542 Cervicalgia: Secondary | ICD-10-CM | POA: Insufficient documentation

## 2011-09-17 DIAGNOSIS — R209 Unspecified disturbances of skin sensation: Secondary | ICD-10-CM | POA: Insufficient documentation

## 2011-09-17 DIAGNOSIS — G8929 Other chronic pain: Secondary | ICD-10-CM | POA: Insufficient documentation

## 2011-09-19 NOTE — Assessment & Plan Note (Signed)
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  She is back in today, reports an average pain at 2-3 on a scale of 10. She is getting good relief with hydrocodone 5/325.  She takes 1 tablet in the morning, a half a tablet at 3 p.m., and a half a tablet at 7 p.m. for a total of 2 tablets per day.  She reports good sleep.  Pain varies with activities, improves with pacing her activities and medications. She gets relatively good relief with current meds.  FUNCTIONAL STATUS:  She walks 30 minutes.  She is able to climb stairs and drive, and she is independent with self-care.  She reports no new problems with respect to review of systems.  Past medical, social, and family history unchanged from previous visit.  PHYSICAL EXAMINATION:  VITAL SIGNS:  Blood pressure is 128/68, pulse 93, respirations 14, 97% saturated on room air. GENERAL:  She is a well-developed, well-nourished woman who appears somewhat younger than her stated age.  She is oriented x3.  Speech is clear.  Affect is bright.  She is alert, cooperative, and pleasant. Follows commands without difficulty.  Answers my questions appropriately. NEUROLOGIC:  Cranial nerves, coordination are intact.  Reflexes are slightly brisk in the upper and lower extremities.  Motor strength, however, is good.  No sensory deficits are noted.  Transitions easily from sitting to standing with ease.  Gait in the room is normal.  Tandem gait and Romberg test are performed adequately.  She has some limitations in cervical motion.  IMPRESSION: 1. 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 2. Mild pes anserine bursitis. 3. Mild trochanteric bursitis. 4.  Long history of fibromyalgia. 5. Brisk reflexes.  PLAN:  Consider repeat cervical radiographs/MRI.  Refill her Norco 5/325, #60, 1 p.o. q.a.m., a half tablet at 3 p.m. and a half tablet at 7 p.m.  I will see her back in a month.  She has been taking her medications as prescribed.  Last urine drug screen was July 07, 2011 and was consistent.  Pill count is appropriate.  Her medical problems include history of pancolitis, abdominal aortic aneurysm repair, and coronary artery disease.     Brantley Stage, M.D.    DMK/MedQ D:  09/17/2011 14:16:17  T:  09/18/2011 11:22:51  Job #:  [id number]

## 2011-10-01 DIAGNOSIS — N951 Menopausal and female climacteric states: Secondary | ICD-10-CM | POA: Diagnosis not present

## 2011-10-15 ENCOUNTER — Encounter: Payer: Self-pay | Admitting: Physical Medicine and Rehabilitation

## 2011-10-15 ENCOUNTER — Encounter
Payer: Medicare Other | Attending: Physical Medicine and Rehabilitation | Admitting: Physical Medicine and Rehabilitation

## 2011-10-15 VITALS — BP 130/61 | HR 86 | Resp 16 | Ht 64.0 in | Wt 172.0 lb

## 2011-10-15 DIAGNOSIS — M5030 Other cervical disc degeneration, unspecified cervical region: Secondary | ICD-10-CM | POA: Insufficient documentation

## 2011-10-15 DIAGNOSIS — M76899 Other specified enthesopathies of unspecified lower limb, excluding foot: Secondary | ICD-10-CM | POA: Diagnosis not present

## 2011-10-15 DIAGNOSIS — M47812 Spondylosis without myelopathy or radiculopathy, cervical region: Secondary | ICD-10-CM | POA: Insufficient documentation

## 2011-10-15 DIAGNOSIS — Q762 Congenital spondylolisthesis: Secondary | ICD-10-CM | POA: Diagnosis not present

## 2011-10-15 DIAGNOSIS — M4302 Spondylolysis, cervical region: Secondary | ICD-10-CM

## 2011-10-15 DIAGNOSIS — M25519 Pain in unspecified shoulder: Secondary | ICD-10-CM | POA: Diagnosis not present

## 2011-10-15 DIAGNOSIS — G8929 Other chronic pain: Secondary | ICD-10-CM

## 2011-10-15 DIAGNOSIS — Q7649 Other congenital malformations of spine, not associated with scoliosis: Secondary | ICD-10-CM | POA: Diagnosis not present

## 2011-10-15 DIAGNOSIS — IMO0002 Reserved for concepts with insufficient information to code with codable children: Secondary | ICD-10-CM | POA: Insufficient documentation

## 2011-10-15 DIAGNOSIS — IMO0001 Reserved for inherently not codable concepts without codable children: Secondary | ICD-10-CM | POA: Diagnosis not present

## 2011-10-15 DIAGNOSIS — M542 Cervicalgia: Secondary | ICD-10-CM | POA: Diagnosis not present

## 2011-10-15 MED ORDER — HYDROCODONE-ACETAMINOPHEN 5-325 MG PO TABS: 1.0000 | ORAL_TABLET | Freq: Two times a day (BID) | ORAL | Status: DC | PRN

## 2011-10-15 MED ORDER — HYDROCODONE-ACETAMINOPHEN 5-325 MG PO TABS: 1.0000 | ORAL_TABLET | Freq: Two times a day (BID) | ORAL | Status: AC | PRN

## 2011-10-15 NOTE — Progress Notes (Signed)
Addended by: Ashok Cordia on: 10/15/2011 09:41 AM   Modules accepted: Orders

## 2011-10-15 NOTE — Progress Notes (Signed)
Subjective:    Patient ID: Kara Bush, female    DOB: 1941/07/21, 71 y.o.   MRN: [id number]  HPI The patient is a 71 year old woman who is seen here at the center for pain and rehabilitative medicine for pain complaints which are related to chronic right in for scapular and periscapular pain. Her average pain is about a 2-3 on a scale of 10 interfering minimally currently with activity levels she reports good relief with the current medication. Patient is uncertain what exacerbates her pain she does note however improvement with medication. It is typically worse in the morning.  Had a recent bout of colitis which was treated with steroid medication. She is now off of her steroids but notes overall improvement in her condition. She is here today for a refill of her pain medication.  Pain Inventory Average Pain 3 Pain Right Now 2 My pain is aching  In the last 24 hours, has pain interfered with the following? General activity 1 Relation with others 0 Enjoyment of life 0 What TIME of day is your pain at its worst? morning Sleep (in general) Fair  Pain is worse with: unsure Pain improves with: medication Relief from Meds: 7  Mobility walk without assistance how many minutes can you walk? 30 ability to climb steps?  yes do you drive?  yes  Function retired  Neuro/Psych No problems in this area  Prior Studies Any changes since last visit?  no  Physicians involved in your care Any changes since last visit?  no  Review of Systems All systems are negative    Objective:   Physical Exam She is a well-developed, well-nourished woman,  who does not appear in any distress. She is oriented x3. Speech is  clear. Affect is bright. She is alert, cooperative, and pleasant.  Follows commands without difficulty, answers my questions appropriately.  Cranial nerves and coordination are intact. Reflexes are slightly brisk  without Hoffmann sign. She has one to two beats of  clonus in the lower  extremities. Her motor strength is good in both upper and lower  extremities without focal deficit. She has slightly decreased vibratory  sensation position sense in the lower extremities. She has decreased  sensation to pinprick over the right C5 dermatome, otherwise sensation  to light touch and pinprick is intact. She transitions easily from  sitting to standing. Her gait is not antalgic. Tandem gait and Romberg  test are performed adequately.  She has slightly decreased range of motion to her neck with rotation to  the left and she has some tenderness over the cervical paraspinal  musculature and tenderness over the medial scapular border both on the  right than on the left, but worse on the right. She has tenderness over  the pes anserine region in both legs as well. Shoulder range of motion is well preserve.        Assessment & Plan:  IMPRESSION:  1. Mild cervicalgia with radiation to right scapular region. recent radiographs than 0-13 2013 show generative changes at C5-6 as well as at C6-7 without evidence of pathological motion with flexion extension views..  2. Bilateral pes anserine bursitis.  3. Mild trochanteric bursitis/iliotibial band syndrome.  4. History of fibromyalgia.  5. Brisk reflexes, etiology not clear. 6. decreased vibratory sensation  From my standpoint brisk reflexes and diminished vibratory sensation indicate possible cervical stenosis. Also consider B12 deficiency.  I discussed these abilities with the patient today. At this time she would like to do  further workup regarding this. She tells me she is very functional and does not bothered by any these problems. She will let me know in the upcoming months if she would like to pursue workup.  A refill pain medication. Will have patient followup with nursing staff over the next 2 months for refill of her pain medication will continue to monitor pill count and narcotic pain medication usage.  She has not had any problems with over sedation or constipation from these medications. She takes them as prescribed without evidence of aberrant behavior.  And we'll see her back in 3 months

## 2011-10-15 NOTE — Patient Instructions (Signed)
Over the next 2 months make an appointment for a refill of your medication but nursing staff. I will see you back in 3 months.

## 2011-10-30 DIAGNOSIS — R5381 Other malaise: Secondary | ICD-10-CM | POA: Diagnosis not present

## 2011-10-30 DIAGNOSIS — R5383 Other fatigue: Secondary | ICD-10-CM | POA: Diagnosis not present

## 2011-10-30 DIAGNOSIS — G4700 Insomnia, unspecified: Secondary | ICD-10-CM | POA: Diagnosis not present

## 2011-10-30 DIAGNOSIS — IMO0001 Reserved for inherently not codable concepts without codable children: Secondary | ICD-10-CM | POA: Diagnosis not present

## 2011-10-30 DIAGNOSIS — M76899 Other specified enthesopathies of unspecified lower limb, excluding foot: Secondary | ICD-10-CM | POA: Diagnosis not present

## 2011-11-03 DIAGNOSIS — H01029 Squamous blepharitis unspecified eye, unspecified eyelid: Secondary | ICD-10-CM | POA: Diagnosis not present

## 2011-11-10 ENCOUNTER — Encounter: Payer: Self-pay | Admitting: Physical Medicine and Rehabilitation

## 2011-11-12 ENCOUNTER — Encounter: Payer: Medicare Other | Attending: Physical Medicine and Rehabilitation | Admitting: *Deleted

## 2011-11-12 ENCOUNTER — Encounter: Payer: Self-pay | Admitting: *Deleted

## 2011-11-12 VITALS — BP 147/71 | HR 89 | Resp 18 | Ht 64.0 in | Wt 172.0 lb

## 2011-11-12 DIAGNOSIS — M2420 Disorder of ligament, unspecified site: Secondary | ICD-10-CM | POA: Diagnosis not present

## 2011-11-12 DIAGNOSIS — M629 Disorder of muscle, unspecified: Secondary | ICD-10-CM | POA: Insufficient documentation

## 2011-11-12 DIAGNOSIS — M76899 Other specified enthesopathies of unspecified lower limb, excluding foot: Secondary | ICD-10-CM | POA: Diagnosis not present

## 2011-11-12 DIAGNOSIS — M25519 Pain in unspecified shoulder: Secondary | ICD-10-CM | POA: Diagnosis not present

## 2011-11-12 DIAGNOSIS — M542 Cervicalgia: Secondary | ICD-10-CM | POA: Insufficient documentation

## 2011-11-12 DIAGNOSIS — IMO0001 Reserved for inherently not codable concepts without codable children: Secondary | ICD-10-CM | POA: Insufficient documentation

## 2011-11-12 DIAGNOSIS — Q7649 Other congenital malformations of spine, not associated with scoliosis: Secondary | ICD-10-CM | POA: Diagnosis not present

## 2011-11-12 DIAGNOSIS — IMO0002 Reserved for concepts with insufficient information to code with codable children: Secondary | ICD-10-CM | POA: Diagnosis not present

## 2011-11-12 MED ORDER — HYDROCODONE-ACETAMINOPHEN 5-325 MG PO TABS: 1.0000 | ORAL_TABLET | Freq: Two times a day (BID) | ORAL | Status: DC

## 2011-11-12 NOTE — Progress Notes (Signed)
Reports no change in pain complaint. States shoulder blade pain is always there, but hasn't worsened. No questions voiced.

## 2011-11-13 ENCOUNTER — Encounter: Payer: Self-pay | Admitting: Physical Medicine and Rehabilitation

## 2011-11-20 DIAGNOSIS — I6529 Occlusion and stenosis of unspecified carotid artery: Secondary | ICD-10-CM | POA: Diagnosis not present

## 2011-11-26 DIAGNOSIS — H04129 Dry eye syndrome of unspecified lacrimal gland: Secondary | ICD-10-CM | POA: Diagnosis not present

## 2011-12-17 ENCOUNTER — Encounter
Payer: Medicare Other | Attending: Physical Medicine and Rehabilitation | Admitting: Physical Medicine and Rehabilitation

## 2011-12-17 ENCOUNTER — Encounter: Payer: Self-pay | Admitting: Physical Medicine and Rehabilitation

## 2011-12-17 VITALS — BP 126/42 | HR 79 | Resp 14 | Ht 64.0 in | Wt 175.0 lb

## 2011-12-17 DIAGNOSIS — M4302 Spondylolysis, cervical region: Secondary | ICD-10-CM

## 2011-12-17 DIAGNOSIS — M25519 Pain in unspecified shoulder: Secondary | ICD-10-CM

## 2011-12-17 DIAGNOSIS — M47812 Spondylosis without myelopathy or radiculopathy, cervical region: Secondary | ICD-10-CM | POA: Diagnosis not present

## 2011-12-17 DIAGNOSIS — M542 Cervicalgia: Secondary | ICD-10-CM | POA: Diagnosis not present

## 2011-12-17 DIAGNOSIS — M5030 Other cervical disc degeneration, unspecified cervical region: Secondary | ICD-10-CM | POA: Diagnosis not present

## 2011-12-17 DIAGNOSIS — Q762 Congenital spondylolisthesis: Secondary | ICD-10-CM | POA: Diagnosis not present

## 2011-12-17 DIAGNOSIS — M76899 Other specified enthesopathies of unspecified lower limb, excluding foot: Secondary | ICD-10-CM | POA: Insufficient documentation

## 2011-12-17 DIAGNOSIS — G8929 Other chronic pain: Secondary | ICD-10-CM

## 2011-12-17 DIAGNOSIS — IMO0001 Reserved for inherently not codable concepts without codable children: Secondary | ICD-10-CM | POA: Insufficient documentation

## 2011-12-17 DIAGNOSIS — IMO0002 Reserved for concepts with insufficient information to code with codable children: Secondary | ICD-10-CM | POA: Diagnosis not present

## 2011-12-17 DIAGNOSIS — Q7649 Other congenital malformations of spine, not associated with scoliosis: Secondary | ICD-10-CM

## 2011-12-17 MED ORDER — HYDROCODONE-ACETAMINOPHEN 5-325 MG PO TABS: 1.0000 | ORAL_TABLET | Freq: Two times a day (BID) | ORAL | Status: AC

## 2011-12-17 NOTE — Progress Notes (Signed)
Subjective:    Patient ID: Kara Bush, female    DOB: 02-28-1941, 71 y.o.   MRN: [id number]  HPIThe patient is a 71 year old woman who is seen here at the center for pain and rehabilitative medicine for pain complaints which are related to chronic right in for scapular and periscapular pain. Her average pain is about a 2-3 on a scale of 10 interfering minimally currently with activity levels she reports good relief with the current medication. Patient is uncertain what exacerbates her pain she does note however improvement with medication. It is typically worse in the morning.  Had a recent bout of colitis which was treated with steroid medication. She is now off of her steroids but notes overall improvement in her condition.  She is here today for a refill of her pain medication.      Pain Inventory Average Pain 4 Pain Right Now 3 My pain is intermittent  In the last 24 hours, has pain interfered with the following? General activity 1 Relation with others 0 Enjoyment of life 0 What TIME of day is your pain at its worst? morning Sleep (in general) Good  Pain is worse with: bending and some activites Pain improves with: rest Relief from Meds: 6  Mobility walk without assistance how many minutes can you walk? 30 min ability to climb steps?  no do you drive?  yes Do you have any goals in this area?  no  Function retired Do you have any goals in this area?  no  Neuro/Psych No problems in this area  Prior Studies Any changes since last visit?  no  Physicians involved in your care Any changes since last visit?  no       Review of Systems  Constitutional: Negative.   HENT: Negative.   Eyes: Negative.   Respiratory: Negative.   Cardiovascular: Negative.   Gastrointestinal: Negative.   Genitourinary: Negative.   Musculoskeletal: Negative.   Skin: Negative.   Neurological: Negative.   Hematological: Negative.   Psychiatric/Behavioral: Negative.       Objective:   Physical Exam  She is a well-developed, well-nourished woman,  who does not appear in any distress. She is oriented x3. Speech is  clear. Affect is bright. She is alert, cooperative, and pleasant.    Follows commands without difficulty, answers my questions appropriately.  Cranial nerves and coordination are intact.   Reflexes are slightly brisk  without Hoffmann sign.   She has one to two beats of clonus in the lower  extremities.   Her motor strength is good in both upper and lower  extremities without focal deficit.   She has slightly decreased vibratory  sensation position sense in the lower extremities.  She has decreased sensation to pinprick over the left C7 dermatome, otherwise sensation  to light touch and pinprick is intact.   Slight decreased sensation Left 5.  She transitions easily from sitting to standing. Her gait is not antalgic. Tandem gait and Romberg  test are performed adequately. Heel toe walking performed adequately.  She has slightly decreased range of motion to her neck with rotation to  the left and she has some tenderness over the cervical paraspinal  musculature and tenderness over the medial scapular border both on the  right than on the left, but worse on the right.   She has tenderness over  the pes anserine region in both legs as well.    Shoulder range of motion is well preserve.  Assessment & Plan:  IMPRESSION:  1. Mild cervicalgia with radiation to right scapular region. recent radiographs than 0-13 2013 show generative changes at C5-6 as well as at C6-7 without evidence of pathological motion with flexion extension views.  2. Bilateral pes anserine bursitis.    3. Mild trochanteric bursitis/iliotibial band syndrome.  4. History of fibromyalgia.    5. Brisk reflexes, etiology not clear.         6. decreased vibratory sensation   From my standpoint brisk reflexes and diminished vibratory sensation indicate  possible cervical stenosis. Also consider B12 deficiency.  I discussed these possibilities with the patient today.   At this time she would like to defer workup regarding this.     She tells me she is very functional and does not bothered by any these problems.   She will let me know in the upcoming months if she would like to pursue workup.     Well refill pain medication. Norco 5/325 one po bid.   Will have patient follow up with nursing staff over the next 2 months for refill of her pain medication will continue to monitor pill count and narcotic pain medication usage. She has not had any problems

## 2011-12-17 NOTE — Patient Instructions (Signed)
Please keep your pain medications locked up and in a secure location.  I continue to work on your postural training. Unhappy here you're still involved in water therapy and I will encourage her to continue this.  I will have the nurse see you back over the next 3 months I'll see you back in 4.

## 2011-12-19 DIAGNOSIS — I70219 Atherosclerosis of native arteries of extremities with intermittent claudication, unspecified extremity: Secondary | ICD-10-CM | POA: Diagnosis not present

## 2011-12-19 DIAGNOSIS — I739 Peripheral vascular disease, unspecified: Secondary | ICD-10-CM | POA: Diagnosis not present

## 2012-01-14 ENCOUNTER — Encounter: Payer: Self-pay | Admitting: Physical Medicine and Rehabilitation

## 2012-01-14 ENCOUNTER — Encounter
Payer: Medicare Other | Attending: Physical Medicine and Rehabilitation | Admitting: Physical Medicine and Rehabilitation

## 2012-01-14 VITALS — BP 134/51 | HR 78 | Resp 14 | Ht 64.0 in | Wt 175.0 lb

## 2012-01-14 DIAGNOSIS — G8929 Other chronic pain: Secondary | ICD-10-CM

## 2012-01-14 DIAGNOSIS — IMO0001 Reserved for inherently not codable concepts without codable children: Secondary | ICD-10-CM

## 2012-01-14 DIAGNOSIS — Q7649 Other congenital malformations of spine, not associated with scoliosis: Secondary | ICD-10-CM | POA: Diagnosis not present

## 2012-01-14 DIAGNOSIS — M797 Fibromyalgia: Secondary | ICD-10-CM

## 2012-01-14 DIAGNOSIS — M25519 Pain in unspecified shoulder: Secondary | ICD-10-CM | POA: Diagnosis not present

## 2012-01-14 DIAGNOSIS — Z79899 Other long term (current) drug therapy: Secondary | ICD-10-CM | POA: Diagnosis not present

## 2012-01-14 DIAGNOSIS — M4302 Spondylolysis, cervical region: Secondary | ICD-10-CM

## 2012-01-14 MED ORDER — HYDROCODONE-ACETAMINOPHEN 5-325 MG PO TABS: 1.0000 | ORAL_TABLET | Freq: Two times a day (BID) | ORAL | Status: AC | PRN

## 2012-01-14 NOTE — Patient Instructions (Signed)
Followup in 2 months  Please keep your pain medications locked up and in a secure location  Please bring your pain medication bottle to your next visit

## 2012-01-14 NOTE — Progress Notes (Signed)
Subjective:    Patient ID: Kara Bush, female    DOB: 10/14/1940, 71 y.o.   MRN: [id number]  HPI  The patient is a 71 year old woman who is seen here at the center for pain and rehabilitative medicine for pain complaints which are related to chronic right in for scapular and periscapular pain. Her average pain is about a 2-3 on a scale of 10 interfering minimally currently with activity levels she reports good relief with the current medication. Patient is uncertain what exacerbates her pain. She does note however improvement with medication. It is typically worse in the morning.  Had a recent bout of colitis which was treated with steroid medication. She is now off of her steroids but notes overall improvement in her condition.    Patient's chief complaint today is right shoulder blade achiness which occurs throughout the day. This is managed fairly well with hydrocondone/acetaminophen 5/325 twice a day dosage.  She also reports intermittent bilateral thumb index and middle finger and ring finger numbness. This occurs sporadically throughout the day beginning approximately 4 weeks ago.  No new neck injuries are reported, no motor vehicle accidents or falls reported.  There is no weakness or new balance problems associated with this.  She does have a previous history of carpal tunnel syndrome which occurred bilaterally but left side was worse and she received injection on that side with relief of her symptoms.  Driving seems to exacerbate this numbness currently as well as neck position positions especially at night.     She is here today for a refill of her pain medication.     Pain Inventory Average Pain 4 Pain Right Now 3 My pain is intermittent and aching  In the last 24 hours, has pain interfered with the following? General activity 3 Relation with others 2 Enjoyment of life 5 What TIME of day is your pain at its worst? daytime Sleep (in general) Poor  Pain is  worse with: bending and some activites Pain improves with: rest, pacing activities and medication Relief from Meds: 5  Mobility walk without assistance how many minutes can you walk? not sure ability to climb steps?  yes do you drive?  yes Do you have any goals in this area?  no  Function retired  Neuro/Psych numbness depression  Prior Studies Any changes since last visit?  no  Physicians involved in your care Any changes since last visit?  no   Family History  Problem Relation Age of Onset  . Heart disease Father   . Cancer Paternal Aunt     BREAST   History   Social History  . Marital Status: Divorced    Spouse Name: N/A    Number of Children: N/A  . Years of Education: N/A   Social History Main Topics  . Smoking status: Never Smoker   . Smokeless tobacco: None  . Alcohol Use: None  . Drug Use: None  . Sexually Active: None   Other Topics Concern  . None   Social History Narrative  . None   Past Surgical History  Procedure Date  . Hernia repair   . Tubal ligation     BTL  . Coronary artery bypass graft   . Coronary stent placement    Past Medical History  Diagnosis Date  . Hypertension   . CAD (coronary artery disease)   . Depression   . Ulcerative colitis   . Hyperlipidemia   . Chronic fatigue fibromyalgia syndrome   .  OSA (obstructive sleep apnea)   . PAD (peripheral artery disease)   . S/P resection of aortic aneurysm   . Chest wall pain   . Cervicalgia   . Pes anserine bursitis   . Fibromyalgia   . Chronic pain syndrome   . Trochanteric bursitis    Pulse 78  Resp 14  Ht 5\' 4"  (1.626 m)  Wt 175 lb (79.379 kg)  BMI 30.04 kg/m2  SpO2 95%     Review of Systems  Neurological: Positive for numbness.  Psychiatric/Behavioral: Positive for dysphoric mood.  All other systems reviewed and are negative.       Objective:   Physical Exam  She is a well-developed, well-nourished woman,  who does not appear in any distress. She  is oriented x3. Speech is  clear. Affect is bright. She is alert, cooperative, and pleasant.  Follows commands without difficulty, answers my questions appropriately.  Cranial nerves and coordination are intact.  Reflexes are slightly brisk  without Hoffmann sign.  She has one to two beats of clonus in the lower  extremities.  Her motor strength is good in both upper and lower  extremities without focal deficit.  She has slightly decreased vibratory  sensation position sense in the lower extremities.  She has decreased sensation to pinprick over the left C7 dermatome, otherwise sensation  to light touch and pinprick is intact.  Slight decreased sensation Left 5.  She transitions easily from sitting to standing. Her gait is not antalgic. Tandem gait and Romberg  test are performed adequately. Heel toe walking performed adequately.  She has slightly decreased range of motion to her neck with rotation to  the left and she has some tenderness over the cervical paraspinal  musculature and tenderness over the medial scapular border both on the  right than on the left, but worse on the right.  She has tenderness over  the pes anserine region in both legs as well.  Shoulder range of motion is well preserve.        Assessment & Plan:  1.Mild cervicalgia with radiation to right scapular region. recent radiographs than 0-13 2013 show generative changes at C5-6 as well as at C6-7 without evidence of pathological motion with flexion extension views. 2.Bilateral pes anserine bursitis.  3.Mild trochanteric bursitis/iliotibial band syndrome. 4.History of fibromyalgia.  5.Brisk reflexes, etiology not clear.        6. decreased vibratory sensation       7. Bilateral intermittent hand numbness: Carpal tunnel syndrome versus C6 radiculopathy mild  From my standpoint brisk reflexes and diminished vibratory sensation indicate possible cervical stenosis. Also consider B12 deficiency.  This has been  discussed with the patient in the past. Per her past request- defer workup regarding this.    Regarding new intermittent bilateral hand numbness patient will monitor activities that exacerbate and improve symptoms. May consider repeat radiographs/MRI should symptoms significantly progress. However initially would consider electrodiagnostic studies to rule out carpal tunnel again.  Patient is in agreement with this.    She tells me she is very functional and does not bothered by any these problems.  She will let me know in the upcoming months if she would like to pursue workup.  Well refill pain medication. Norco 5/325 one po bid.  Will have patient follow up with nursing staff over the next 2 months for refill of her pain medication will continue to monitor pill count and narcotic pain medication usage. She has not had any problems  Will see her back in 2 months she is given a prescription with one refill of her 5/325 oxycodone/acetaminophen #60.

## 2012-02-11 ENCOUNTER — Ambulatory Visit: Payer: Medicare Other | Admitting: Physical Medicine and Rehabilitation

## 2012-02-11 DIAGNOSIS — K5190 Ulcerative colitis, unspecified, without complications: Secondary | ICD-10-CM | POA: Diagnosis not present

## 2012-02-17 DIAGNOSIS — I10 Essential (primary) hypertension: Secondary | ICD-10-CM | POA: Diagnosis not present

## 2012-02-17 DIAGNOSIS — I2510 Atherosclerotic heart disease of native coronary artery without angina pectoris: Secondary | ICD-10-CM | POA: Diagnosis not present

## 2012-02-17 DIAGNOSIS — I739 Peripheral vascular disease, unspecified: Secondary | ICD-10-CM | POA: Diagnosis not present

## 2012-03-01 ENCOUNTER — Other Ambulatory Visit: Payer: Self-pay | Admitting: *Deleted

## 2012-03-05 ENCOUNTER — Encounter: Payer: Self-pay | Admitting: Physical Medicine and Rehabilitation

## 2012-03-05 ENCOUNTER — Encounter
Payer: Medicare Other | Attending: Physical Medicine and Rehabilitation | Admitting: Physical Medicine and Rehabilitation

## 2012-03-05 VITALS — BP 144/64 | HR 89 | Resp 14 | Ht 64.0 in | Wt 174.0 lb

## 2012-03-05 DIAGNOSIS — M76899 Other specified enthesopathies of unspecified lower limb, excluding foot: Secondary | ICD-10-CM | POA: Insufficient documentation

## 2012-03-05 DIAGNOSIS — M542 Cervicalgia: Secondary | ICD-10-CM | POA: Diagnosis not present

## 2012-03-05 DIAGNOSIS — IMO0001 Reserved for inherently not codable concepts without codable children: Secondary | ICD-10-CM | POA: Insufficient documentation

## 2012-03-05 DIAGNOSIS — M25519 Pain in unspecified shoulder: Secondary | ICD-10-CM | POA: Diagnosis not present

## 2012-03-05 DIAGNOSIS — M545 Low back pain, unspecified: Secondary | ICD-10-CM | POA: Insufficient documentation

## 2012-03-05 DIAGNOSIS — R209 Unspecified disturbances of skin sensation: Secondary | ICD-10-CM | POA: Insufficient documentation

## 2012-03-05 DIAGNOSIS — E785 Hyperlipidemia, unspecified: Secondary | ICD-10-CM | POA: Insufficient documentation

## 2012-03-05 DIAGNOSIS — G8929 Other chronic pain: Secondary | ICD-10-CM | POA: Insufficient documentation

## 2012-03-05 DIAGNOSIS — G4733 Obstructive sleep apnea (adult) (pediatric): Secondary | ICD-10-CM | POA: Diagnosis not present

## 2012-03-05 DIAGNOSIS — I2510 Atherosclerotic heart disease of native coronary artery without angina pectoris: Secondary | ICD-10-CM | POA: Diagnosis not present

## 2012-03-05 DIAGNOSIS — M47812 Spondylosis without myelopathy or radiculopathy, cervical region: Secondary | ICD-10-CM | POA: Insufficient documentation

## 2012-03-05 DIAGNOSIS — I10 Essential (primary) hypertension: Secondary | ICD-10-CM | POA: Insufficient documentation

## 2012-03-05 DIAGNOSIS — IMO0002 Reserved for concepts with insufficient information to code with codable children: Secondary | ICD-10-CM | POA: Insufficient documentation

## 2012-03-05 MED ORDER — METHOCARBAMOL 500 MG PO TABS: 500.0000 mg | ORAL_TABLET | Freq: Three times a day (TID) | ORAL | Status: AC

## 2012-03-05 MED ORDER — HYDROCODONE-ACETAMINOPHEN 5-325 MG PO TABS: 1.0000 | ORAL_TABLET | Freq: Two times a day (BID) | ORAL | Status: DC

## 2012-03-05 NOTE — Progress Notes (Signed)
Subjective:    Patient ID: Kara Bush, female    DOB: 1941-01-02, 71 y.o.   MRN: [id number]  HPI The patient complains about chronic low back pain, neck pain and right shoulder blade pain . The patient also complains about intermittend, positional numbness and tingling in the C6 distribution. The problem has been stable.  Pain Inventory Average Pain 4 Pain Right Now 4 My pain is aching  In the last 24 hours, has pain interfered with the following? General activity 2 Relation with others 0 Enjoyment of life 2 What TIME of day is your pain at its worst? morning Sleep (in general) Good  Pain is worse with: bending Pain improves with: pacing activities and medication Relief from Meds: 8  Mobility walk without assistance how many minutes can you walk? 30  Function retired  Neuro/Psych No problems in this area  Prior Studies Any changes since last visit?  no  Physicians involved in your care Any changes since last visit?  no   Family History  Problem Relation Age of Onset  . Heart disease Father   . Cancer Paternal Aunt     BREAST   History   Social History  . Marital Status: Divorced    Spouse Name: N/A    Number of Children: N/A  . Years of Education: N/A   Social History Main Topics  . Smoking status: Never Smoker   . Smokeless tobacco: Never Used  . Alcohol Use: None  . Drug Use: None  . Sexually Active: None   Other Topics Concern  . None   Social History Narrative  . None   Past Surgical History  Procedure Date  . Hernia repair   . Tubal ligation     BTL  . Coronary artery bypass graft   . Coronary stent placement    Past Medical History  Diagnosis Date  . Hypertension   . CAD (coronary artery disease)   . Depression   . Ulcerative colitis   . Hyperlipidemia   . Chronic fatigue fibromyalgia syndrome   . OSA (obstructive sleep apnea)   . PAD (peripheral artery disease)   . S/P resection of aortic aneurysm   . Chest wall  pain   . Cervicalgia   . Pes anserine bursitis   . Fibromyalgia   . Chronic pain syndrome   . Trochanteric bursitis    BP 144/64  Pulse 89  Resp 14  Ht 5\' 4"  (1.626 m)  Wt 174 lb (78.926 kg)  BMI 29.87 kg/m2  SpO2 94%    Review of Systems  Constitutional: Positive for unexpected weight change.  All other systems reviewed and are negative.       Objective:   Physical Exam  Constitutional: She is oriented to person, place, and time. She appears well-developed and well-nourished.  HENT:  Head: Normocephalic.  Neck: Neck supple.  Musculoskeletal: She exhibits tenderness.  Neurological: She is alert and oriented to person, place, and time.  Skin: Skin is warm and dry.  Psychiatric: She has a normal mood and affect.    Symmetric normal motor tone is noted throughout, except increased muscle tone in trapezius bilateral. Normal muscle bulk. Muscle testing reveals 5/5 muscle strength of the upper extremity, except wrist extension on the left, C6, and 5/5 of the lower extremity. Full range of motion in upper and lower extremities. ROM of spine is restricted. Fine motor movements are normal in both hands. Sensory is intact and symmetric to light touch, pinprick and  proprioception. DTR in the upper and lower extremity are present and symmetric 3+. No clonus is noted.  Patient arises from chair without difficulty. Narrow based gait with normal arm swing bilateral , able to walk on heels and toes . Tandem walk is stable. No pronator drift. Rhomberg negative. Good finger to nose and heel to shin testing. No tremor, dystaxia or dysmetria noted.        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 recent radiographs than 0-13 2013 show degenerative changes at C5-6 as well as at C6-7 without evidence of pathological motion with flexion extension views, they also show short pedicles.  2.Bilateral pes anserine bursitis.  3.Mild  trochanteric bursitis/iliotibial band syndrome.  4.History of fibromyalgia.  5.Brisk reflexes,most likely cervical spinal stenosis, short pedicles.  6. decreased vibratory sensation  7. Bilateral intermittent hand numbness: Carpal tunnel syndrome versus C6 radiculopathy mild  From my standpoint brisk reflexes and diminished vibratory sensation indicate possible cervical stenosis.  Advised patient to continue with water exercises 3x per week, also advised patient to keep a good posture showed her how to correct her posture. Prescribed Robaxin for her muscle pain, which she has tolerated well in the past. Follow up in 2 month.

## 2012-03-05 NOTE — Patient Instructions (Signed)
Continue with water exersises,

## 2012-03-18 ENCOUNTER — Telehealth: Payer: Self-pay | Admitting: Physical Medicine and Rehabilitation

## 2012-03-18 NOTE — Telephone Encounter (Signed)
PA wrote for generic Robaxin.  Pharmacy needs prior auth, did we get fax form [facility]?

## 2012-03-18 NOTE — Telephone Encounter (Signed)
Prior auth requested.  Pt aware

## 2012-03-24 DIAGNOSIS — H2510 Age-related nuclear cataract, unspecified eye: Secondary | ICD-10-CM | POA: Diagnosis not present

## 2012-03-30 DIAGNOSIS — G4700 Insomnia, unspecified: Secondary | ICD-10-CM | POA: Diagnosis not present

## 2012-03-30 DIAGNOSIS — M76899 Other specified enthesopathies of unspecified lower limb, excluding foot: Secondary | ICD-10-CM | POA: Diagnosis not present

## 2012-03-30 DIAGNOSIS — IMO0001 Reserved for inherently not codable concepts without codable children: Secondary | ICD-10-CM | POA: Diagnosis not present

## 2012-03-30 DIAGNOSIS — R5383 Other fatigue: Secondary | ICD-10-CM | POA: Diagnosis not present

## 2012-03-30 DIAGNOSIS — R5381 Other malaise: Secondary | ICD-10-CM | POA: Diagnosis not present

## 2012-05-07 ENCOUNTER — Encounter: Payer: Self-pay | Admitting: Physical Medicine and Rehabilitation

## 2012-05-07 ENCOUNTER — Encounter
Payer: Medicare Other | Attending: Physical Medicine and Rehabilitation | Admitting: Physical Medicine and Rehabilitation

## 2012-05-07 VITALS — BP 129/65 | HR 92 | Resp 14 | Ht 64.0 in | Wt 174.2 lb

## 2012-05-07 DIAGNOSIS — M629 Disorder of muscle, unspecified: Secondary | ICD-10-CM | POA: Insufficient documentation

## 2012-05-07 DIAGNOSIS — M542 Cervicalgia: Secondary | ICD-10-CM | POA: Insufficient documentation

## 2012-05-07 DIAGNOSIS — M76899 Other specified enthesopathies of unspecified lower limb, excluding foot: Secondary | ICD-10-CM | POA: Insufficient documentation

## 2012-05-07 DIAGNOSIS — I10 Essential (primary) hypertension: Secondary | ICD-10-CM | POA: Diagnosis not present

## 2012-05-07 DIAGNOSIS — M545 Low back pain, unspecified: Secondary | ICD-10-CM | POA: Insufficient documentation

## 2012-05-07 DIAGNOSIS — I739 Peripheral vascular disease, unspecified: Secondary | ICD-10-CM | POA: Insufficient documentation

## 2012-05-07 DIAGNOSIS — E785 Hyperlipidemia, unspecified: Secondary | ICD-10-CM | POA: Insufficient documentation

## 2012-05-07 DIAGNOSIS — IMO0002 Reserved for concepts with insufficient information to code with codable children: Secondary | ICD-10-CM | POA: Diagnosis not present

## 2012-05-07 DIAGNOSIS — G8929 Other chronic pain: Secondary | ICD-10-CM | POA: Insufficient documentation

## 2012-05-07 DIAGNOSIS — G4733 Obstructive sleep apnea (adult) (pediatric): Secondary | ICD-10-CM | POA: Insufficient documentation

## 2012-05-07 DIAGNOSIS — M25519 Pain in unspecified shoulder: Secondary | ICD-10-CM | POA: Insufficient documentation

## 2012-05-07 DIAGNOSIS — IMO0001 Reserved for inherently not codable concepts without codable children: Secondary | ICD-10-CM | POA: Insufficient documentation

## 2012-05-07 DIAGNOSIS — R209 Unspecified disturbances of skin sensation: Secondary | ICD-10-CM | POA: Insufficient documentation

## 2012-05-07 DIAGNOSIS — M47812 Spondylosis without myelopathy or radiculopathy, cervical region: Secondary | ICD-10-CM | POA: Diagnosis not present

## 2012-05-07 DIAGNOSIS — I2510 Atherosclerotic heart disease of native coronary artery without angina pectoris: Secondary | ICD-10-CM | POA: Diagnosis not present

## 2012-05-07 DIAGNOSIS — Z951 Presence of aortocoronary bypass graft: Secondary | ICD-10-CM | POA: Diagnosis not present

## 2012-05-07 DIAGNOSIS — M2420 Disorder of ligament, unspecified site: Secondary | ICD-10-CM | POA: Insufficient documentation

## 2012-05-07 MED ORDER — HYDROCODONE-ACETAMINOPHEN 5-325 MG PO TABS: 1.0000 | ORAL_TABLET | Freq: Two times a day (BID) | ORAL | Status: DC

## 2012-05-07 NOTE — Patient Instructions (Signed)
Continue with exercising in the pool, try to keep a good posture.

## 2012-05-07 NOTE — Progress Notes (Signed)
Subjective:    Patient ID: Kara Bush, female    DOB: [birth year], 71 y.o.   MRN: [id number]  HPI The patient complains about chronic low back pain, neck pain and right shoulder blade pain . The patient also complains about intermittend, positional numbness and tingling in the C6 distribution.  The problem has been stable. The patient is doing water aerobics 3 times per week, and is paying attention to a good posture.  Pain Inventory Average Pain 3 Pain Right Now 3 My pain is aching  In the last 24 hours, has pain interfered with the following? General activity 0 Relation with others 0 Enjoyment of life 0 What TIME of day is your pain at its worst? morning and evening Sleep (in general) Good  Pain is worse with: some activites and hard to say Pain improves with: medication Relief from Meds: 4  Mobility walk without assistance ability to climb steps?  yes do you drive?  yes  Function retired  Neuro/Psych No problems in this area  Prior Studies Any changes since last visit?  no  Physicians involved in your care Any changes since last visit?  no   Family History  Problem Relation Age of Onset  . Heart disease Father   . Cancer Paternal Aunt     BREAST   History   Social History  . Marital Status: Divorced    Spouse Name: N/A    Number of Children: N/A  . Years of Education: N/A   Social History Main Topics  . Smoking status: Never Smoker   . Smokeless tobacco: Never Used  . Alcohol Use: None  . Drug Use: None  . Sexually Active: None   Other Topics Concern  . None   Social History Narrative  . None   Past Surgical History  Procedure Date  . Hernia repair   . Tubal ligation     BTL  . Coronary artery bypass graft   . Coronary stent placement    Past Medical History  Diagnosis Date  . Hypertension   . CAD (coronary artery disease)   . Depression   . Ulcerative colitis   . Hyperlipidemia   . Chronic fatigue fibromyalgia syndrome     . OSA (obstructive sleep apnea)   . PAD (peripheral artery disease)   . S/P resection of aortic aneurysm   . Chest wall pain   . Cervicalgia   . Pes anserine bursitis   . Fibromyalgia   . Chronic pain syndrome   . Trochanteric bursitis    BP 129/65  Pulse 92  Resp 14  Wt 174 lb 3.2 oz (79.017 kg)  SpO2 96%    Review of Systems  Musculoskeletal:       Hip pain and shoulder pain (scapula)  All other systems reviewed and are negative.       Objective:   Physical Exam Constitutional: She is oriented to person, place, and time. She appears well-developed and well-nourished.  HENT:  Head: Normocephalic.  Neck: Neck supple.  Musculoskeletal: She exhibits tenderness.  Neurological: She is alert and oriented to person, place, and time.  Skin: Skin is warm and dry.  Psychiatric: She has a normal mood and affect.   Symmetric normal motor tone is noted throughout, except increased muscle tone in trapezius bilateral. Normal muscle bulk. Muscle testing reveals 5/5 muscle strength of the upper extremity, except wrist extension on the left, C6, and 5/5 of the lower extremity. Full range of motion in upper and  lower extremities. ROM of spine is restricted. Fine motor movements are normal in both hands.  Sensory is intact and symmetric to light touch, pinprick and proprioception.  DTR in the upper and lower extremity are present and symmetric 3+. No clonus is noted.  Patient arises from chair without difficulty. Narrow based gait with normal arm swing bilateral , able to walk on heels and toes . Tandem walk is stable. No pronator drift. Rhomberg negative.  Good finger to nose and heel to shin testing. No tremor, dystaxia or dysmetria noted.         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 recent radiographs than 0-13 2013 show degenerative changes at C5-6 as well as at C6-7 without evidence of pathological motion with flexion  extension views, they also show short pedicles.  2.Bilateral pes anserine bursitis.  3.Mild trochanteric bursitis/iliotibial band syndrome.  4.History of fibromyalgia.  5.Brisk reflexes,most likely cervical spinal stenosis, short pedicles.  6. Bilateral intermittent hand numbness: Carpal tunnel syndrome versus C6 radiculopathy mild   Advised patient to continue with water exercises 3x per week, also advised patient to keep a good posture showed her how to correct her posture. Prescribed Robaxin for her muscle pain, at last visit, which she has tolerated well in the past, she uses it sparingly.  Follow up in 3 month.

## 2012-05-24 DIAGNOSIS — I6529 Occlusion and stenosis of unspecified carotid artery: Secondary | ICD-10-CM | POA: Diagnosis not present

## 2012-07-12 DIAGNOSIS — Z85828 Personal history of other malignant neoplasm of skin: Secondary | ICD-10-CM | POA: Diagnosis not present

## 2012-07-12 DIAGNOSIS — L940 Localized scleroderma [morphea]: Secondary | ICD-10-CM | POA: Diagnosis not present

## 2012-07-12 DIAGNOSIS — L821 Other seborrheic keratosis: Secondary | ICD-10-CM | POA: Diagnosis not present

## 2012-07-12 DIAGNOSIS — L608 Other nail disorders: Secondary | ICD-10-CM | POA: Diagnosis not present

## 2012-07-12 DIAGNOSIS — L259 Unspecified contact dermatitis, unspecified cause: Secondary | ICD-10-CM | POA: Diagnosis not present

## 2012-07-12 DIAGNOSIS — D239 Other benign neoplasm of skin, unspecified: Secondary | ICD-10-CM | POA: Diagnosis not present

## 2012-07-13 DIAGNOSIS — Z1231 Encounter for screening mammogram for malignant neoplasm of breast: Secondary | ICD-10-CM | POA: Diagnosis not present

## 2012-07-19 ENCOUNTER — Telehealth: Payer: Self-pay

## 2012-07-19 MED ORDER — HYDROCODONE-ACETAMINOPHEN 5-325 MG PO TABS: 1.0000 | ORAL_TABLET | Freq: Two times a day (BID) | ORAL | Status: DC

## 2012-07-19 NOTE — Telephone Encounter (Signed)
Refilled and Nataly notified.

## 2012-07-19 NOTE — Telephone Encounter (Signed)
Has appointment 1/3 but will be out of hydrocodone before then.

## 2012-08-06 ENCOUNTER — Encounter: Payer: Self-pay | Admitting: Physical Medicine and Rehabilitation

## 2012-08-06 ENCOUNTER — Encounter
Payer: Medicare Other | Attending: Physical Medicine and Rehabilitation | Admitting: Physical Medicine and Rehabilitation

## 2012-08-06 VITALS — BP 143/65 | HR 88 | Resp 14 | Ht 64.0 in | Wt 179.4 lb

## 2012-08-06 DIAGNOSIS — IMO0002 Reserved for concepts with insufficient information to code with codable children: Secondary | ICD-10-CM | POA: Diagnosis not present

## 2012-08-06 DIAGNOSIS — M47812 Spondylosis without myelopathy or radiculopathy, cervical region: Secondary | ICD-10-CM

## 2012-08-06 DIAGNOSIS — M629 Disorder of muscle, unspecified: Secondary | ICD-10-CM | POA: Diagnosis not present

## 2012-08-06 DIAGNOSIS — M76899 Other specified enthesopathies of unspecified lower limb, excluding foot: Secondary | ICD-10-CM | POA: Insufficient documentation

## 2012-08-06 DIAGNOSIS — M542 Cervicalgia: Secondary | ICD-10-CM | POA: Diagnosis not present

## 2012-08-06 DIAGNOSIS — G8929 Other chronic pain: Secondary | ICD-10-CM | POA: Diagnosis not present

## 2012-08-06 DIAGNOSIS — M461 Sacroiliitis, not elsewhere classified: Secondary | ICD-10-CM | POA: Diagnosis not present

## 2012-08-06 DIAGNOSIS — M2420 Disorder of ligament, unspecified site: Secondary | ICD-10-CM | POA: Insufficient documentation

## 2012-08-06 DIAGNOSIS — R209 Unspecified disturbances of skin sensation: Secondary | ICD-10-CM | POA: Diagnosis not present

## 2012-08-06 MED ORDER — DICLOFENAC EPOLAMINE 1.3 % TD PTCH: 1.0000 | MEDICATED_PATCH | Freq: Two times a day (BID) | TRANSDERMAL | Status: DC

## 2012-08-06 MED ORDER — HYDROCODONE-ACETAMINOPHEN 5-325 MG PO TABS: 1.0000 | ORAL_TABLET | Freq: Two times a day (BID) | ORAL | Status: DC

## 2012-08-06 NOTE — Patient Instructions (Signed)
Continue with your aquatic exercises, you could try Arnica cream for your arthritic hand pain.

## 2012-08-06 NOTE — Progress Notes (Signed)
Subjective:    Patient ID: Kara Bush, female    DOB: 1940-08-15, 72 y.o.   MRN: [id number]  HPI The patient complains about chronic low back pain, neck pain and right shoulder blade pain . The patient also complains about intermittend, positional numbness and tingling in the C6 distribution.  The problem has been stable. The patient is doing water aerobics 3 times per week, and is paying attention to a good posture. Today she is complaining about pain around her right SI joint.  Pain Inventory Average Pain 3 Pain Right Now 3 My pain is dull  In the last 24 hours, has pain interfered with the following? General activity 1 Relation with others 0 Enjoyment of life 1 What TIME of day is your pain at its worst? morning Sleep (in general) Good  Pain is worse with: walking and bending Pain improves with: rest and medication Relief from Meds: 8  Mobility walk without assistance how many minutes can you walk? 30 ability to climb steps?  yes do you drive?  yes  Function retired I need assistance with the following:  meal prep  Neuro/Psych depression anxiety  Prior Studies Any changes since last visit?  no  Physicians involved in your care Any changes since last visit?  no   Family History  Problem Relation Age of Onset  . Heart disease Father   . Cancer Paternal Aunt     BREAST   History   Social History  . Marital Status: Divorced    Spouse Name: N/A    Number of Children: N/A  . Years of Education: N/A   Social History Main Topics  . Smoking status: Never Smoker   . Smokeless tobacco: Never Used  . Alcohol Use: None  . Drug Use: None  . Sexually Active: None   Other Topics Concern  . None   Social History Narrative  . None   Past Surgical History  Procedure Date  . Hernia repair   . Tubal ligation     BTL  . Coronary artery bypass graft   . Coronary stent placement    Past Medical History  Diagnosis Date  . Hypertension   . CAD  (coronary artery disease)   . Depression   . Ulcerative colitis   . Hyperlipidemia   . Chronic fatigue fibromyalgia syndrome   . OSA (obstructive sleep apnea)   . PAD (peripheral artery disease)   . S/P resection of aortic aneurysm   . Chest wall pain   . Cervicalgia   . Pes anserine bursitis   . Fibromyalgia   . Chronic pain syndrome   . Trochanteric bursitis    BP 143/65  Pulse 88  Resp 14  Ht 5\' 4"  (1.626 m)  Wt 179 lb 6.4 oz (81.375 kg)  BMI 30.79 kg/m2  SpO2 96%    Review of Systems  Musculoskeletal: Positive for gait problem.  Psychiatric/Behavioral: Positive for dysphoric mood. The patient is nervous/anxious.   All other systems reviewed and are negative.       Objective:   Physical Exam Constitutional: She is oriented to person, place, and time. She appears well-developed and well-nourished.  HENT:  Head: Normocephalic.  Neck: Neck supple.  Musculoskeletal: She exhibits tenderness.  Neurological: She is alert and oriented to person, place, and time.  Skin: Skin is warm and dry.  Psychiatric: She has a normal mood and affect.  Symmetric normal motor tone is noted throughout, except increased muscle tone in trapezius bilateral. Normal  muscle bulk. Muscle testing reveals 5/5 muscle strength of the upper extremity, except wrist extension on the left, C6, and 5/5 of the lower extremity. Full range of motion in upper and lower extremities. ROM of spine is restricted. Fine motor movements are normal in both hands.  Sensory is intact and symmetric to light touch, pinprick and proprioception.  DTR in the upper and lower extremity are present and symmetric 3+. No clonus is noted.  Patient arises from chair without difficulty. Narrow based gait with normal arm swing bilateral , able to walk on heels and toes . Tandem walk is stable. No pronator drift. Rhomberg negative.  Good finger to nose and heel to shin testing. No tremor, dystaxia or dysmetria noted.           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 recent radiographs than 0-13 2013 show degenerative changes at C5-6 as well as at C6-7 without evidence of pathological motion with flexion extension views, they also show short pedicles.  2.Bilateral pes anserine bursitis.  3.Mild trochanteric bursitis/iliotibial band syndrome.  4.History of fibromyalgia.  5.Brisk reflexes,most likely cervical spinal stenosis, short pedicles.  6. Bilateral intermittent hand numbness: Carpal tunnel syndrome versus C6 radiculopathy mild 7. LBP, with SI inflammation/irritation on the right.   Advised patient to continue with water exercises 3x per week, also advised patient to keep a good posture showed her how to correct her posture.Also showed her some stretching exercises for her SI joint.Prescribed Flector patches, for inflammation/irritation of SI joint, and lumbar muscle strain. Prescribed Robaxin for her muscle pain, at last visit, which she has tolerated well in the past, she uses it sparingly.  Follow up in 3 month.

## 2012-08-11 DIAGNOSIS — K5190 Ulcerative colitis, unspecified, without complications: Secondary | ICD-10-CM | POA: Diagnosis not present

## 2012-08-16 DIAGNOSIS — H01029 Squamous blepharitis unspecified eye, unspecified eyelid: Secondary | ICD-10-CM | POA: Diagnosis not present

## 2012-09-02 DIAGNOSIS — R5381 Other malaise: Secondary | ICD-10-CM | POA: Diagnosis not present

## 2012-09-02 DIAGNOSIS — G4700 Insomnia, unspecified: Secondary | ICD-10-CM | POA: Diagnosis not present

## 2012-09-02 DIAGNOSIS — M76899 Other specified enthesopathies of unspecified lower limb, excluding foot: Secondary | ICD-10-CM | POA: Diagnosis not present

## 2012-09-02 DIAGNOSIS — R5383 Other fatigue: Secondary | ICD-10-CM | POA: Diagnosis not present

## 2012-09-02 DIAGNOSIS — IMO0001 Reserved for inherently not codable concepts without codable children: Secondary | ICD-10-CM | POA: Diagnosis not present

## 2012-09-10 ENCOUNTER — Other Ambulatory Visit: Payer: Self-pay | Admitting: Internal Medicine

## 2012-09-10 DIAGNOSIS — I7389 Other specified peripheral vascular diseases: Secondary | ICD-10-CM | POA: Diagnosis not present

## 2012-09-10 DIAGNOSIS — Z951 Presence of aortocoronary bypass graft: Secondary | ICD-10-CM | POA: Diagnosis not present

## 2012-09-10 DIAGNOSIS — I77810 Thoracic aortic ectasia: Secondary | ICD-10-CM

## 2012-09-10 DIAGNOSIS — I712 Thoracic aortic aneurysm, without rupture: Secondary | ICD-10-CM

## 2012-09-10 DIAGNOSIS — I719 Aortic aneurysm of unspecified site, without rupture: Secondary | ICD-10-CM | POA: Diagnosis not present

## 2012-09-14 ENCOUNTER — Other Ambulatory Visit: Payer: Self-pay | Admitting: Family Medicine

## 2012-09-14 ENCOUNTER — Other Ambulatory Visit (HOSPITAL_COMMUNITY)
Admission: RE | Admit: 2012-09-14 | Discharge: 2012-09-14 | Disposition: A | Discharge status: Home / Self Care | Payer: Medicare Other | Source: Ambulatory Visit | Attending: Family Medicine | Admitting: Family Medicine

## 2012-09-14 DIAGNOSIS — L738 Other specified follicular disorders: Secondary | ICD-10-CM | POA: Diagnosis not present

## 2012-09-14 DIAGNOSIS — Z124 Encounter for screening for malignant neoplasm of cervix: Secondary | ICD-10-CM | POA: Insufficient documentation

## 2012-09-14 DIAGNOSIS — I2510 Atherosclerotic heart disease of native coronary artery without angina pectoris: Secondary | ICD-10-CM | POA: Diagnosis not present

## 2012-09-14 DIAGNOSIS — Z0000 Encounter for general adult medical examination without abnormal findings: Secondary | ICD-10-CM | POA: Diagnosis not present

## 2012-09-14 DIAGNOSIS — Z1331 Encounter for screening for depression: Secondary | ICD-10-CM | POA: Diagnosis not present

## 2012-09-14 DIAGNOSIS — I10 Essential (primary) hypertension: Secondary | ICD-10-CM | POA: Diagnosis not present

## 2012-09-14 DIAGNOSIS — E7800 Pure hypercholesterolemia, unspecified: Secondary | ICD-10-CM | POA: Diagnosis not present

## 2012-09-14 DIAGNOSIS — R635 Abnormal weight gain: Secondary | ICD-10-CM | POA: Diagnosis not present

## 2012-09-14 DIAGNOSIS — Z79899 Other long term (current) drug therapy: Secondary | ICD-10-CM | POA: Diagnosis not present

## 2012-09-16 ENCOUNTER — Other Ambulatory Visit: Payer: Self-pay | Admitting: Physical Medicine and Rehabilitation

## 2012-09-20 MED ORDER — HYDROCODONE-ACETAMINOPHEN 5-325 MG PO TABS: 1.0000 | ORAL_TABLET | Freq: Two times a day (BID) | ORAL | Status: DC

## 2012-09-20 NOTE — Telephone Encounter (Signed)
Hydrocodone called in 

## 2012-09-21 ENCOUNTER — Ambulatory Visit
Admission: RE | Admit: 2012-09-21 | Discharge: 2012-09-21 | Disposition: A | Discharge status: Home / Self Care | Payer: Medicare Other | Source: Ambulatory Visit | Attending: Internal Medicine | Admitting: Internal Medicine

## 2012-09-21 DIAGNOSIS — I712 Thoracic aortic aneurysm, without rupture, unspecified: Secondary | ICD-10-CM | POA: Diagnosis not present

## 2012-09-21 DIAGNOSIS — I77810 Thoracic aortic ectasia: Secondary | ICD-10-CM

## 2012-09-21 DIAGNOSIS — I2699 Other pulmonary embolism without acute cor pulmonale: Secondary | ICD-10-CM | POA: Diagnosis not present

## 2012-09-21 MED ORDER — IOHEXOL 350 MG/ML SOLN
100.0000 mL | Freq: Once | INTRAVENOUS | Status: AC | PRN
  Administered 2012-09-21: 100 mL via INTRAVENOUS

## 2012-09-22 ENCOUNTER — Other Ambulatory Visit (HOSPITAL_COMMUNITY): Payer: Self-pay | Admitting: Cardiology

## 2012-09-22 DIAGNOSIS — Z86711 Personal history of pulmonary embolism: Secondary | ICD-10-CM

## 2012-09-22 DIAGNOSIS — I2699 Other pulmonary embolism without acute cor pulmonale: Secondary | ICD-10-CM | POA: Diagnosis not present

## 2012-09-23 ENCOUNTER — Ambulatory Visit (HOSPITAL_COMMUNITY)
Admission: RE | Admit: 2012-09-23 | Discharge: 2012-09-23 | Disposition: A | Discharge status: Home / Self Care | Payer: Medicare Other | Source: Ambulatory Visit | Attending: Internal Medicine | Admitting: Internal Medicine

## 2012-09-23 DIAGNOSIS — I749 Embolism and thrombosis of unspecified artery: Secondary | ICD-10-CM | POA: Diagnosis not present

## 2012-09-23 DIAGNOSIS — Z86711 Personal history of pulmonary embolism: Secondary | ICD-10-CM

## 2012-09-23 DIAGNOSIS — I2699 Other pulmonary embolism without acute cor pulmonale: Secondary | ICD-10-CM | POA: Insufficient documentation

## 2012-09-23 NOTE — Progress Notes (Signed)
Bilateral Venous Duplex Completed. Negative for DVT Kara Bush, Kara Bush

## 2012-09-30 DIAGNOSIS — I2699 Other pulmonary embolism without acute cor pulmonale: Secondary | ICD-10-CM | POA: Diagnosis not present

## 2012-09-30 DIAGNOSIS — I719 Aortic aneurysm of unspecified site, without rupture: Secondary | ICD-10-CM | POA: Diagnosis not present

## 2012-09-30 DIAGNOSIS — T1510XA Foreign body in conjunctival sac, unspecified eye, initial encounter: Secondary | ICD-10-CM | POA: Diagnosis not present

## 2012-09-30 DIAGNOSIS — H18509 Unspecified hereditary corneal dystrophies, unspecified eye: Secondary | ICD-10-CM | POA: Diagnosis not present

## 2012-09-30 DIAGNOSIS — I739 Peripheral vascular disease, unspecified: Secondary | ICD-10-CM | POA: Diagnosis not present

## 2012-10-08 ENCOUNTER — Encounter: Payer: Medicare Other | Admitting: Physical Medicine and Rehabilitation

## 2012-10-11 ENCOUNTER — Encounter
Payer: Medicare Other | Attending: Physical Medicine and Rehabilitation | Admitting: Physical Medicine and Rehabilitation

## 2012-10-11 ENCOUNTER — Encounter: Payer: Self-pay | Admitting: Physical Medicine and Rehabilitation

## 2012-10-11 VITALS — BP 134/67 | HR 101 | Resp 14 | Ht 64.0 in | Wt 180.0 lb

## 2012-10-11 DIAGNOSIS — M25519 Pain in unspecified shoulder: Secondary | ICD-10-CM | POA: Insufficient documentation

## 2012-10-11 DIAGNOSIS — M2420 Disorder of ligament, unspecified site: Secondary | ICD-10-CM | POA: Insufficient documentation

## 2012-10-11 DIAGNOSIS — M629 Disorder of muscle, unspecified: Secondary | ICD-10-CM | POA: Insufficient documentation

## 2012-10-11 DIAGNOSIS — G8929 Other chronic pain: Secondary | ICD-10-CM | POA: Insufficient documentation

## 2012-10-11 DIAGNOSIS — M47812 Spondylosis without myelopathy or radiculopathy, cervical region: Secondary | ICD-10-CM | POA: Diagnosis not present

## 2012-10-11 DIAGNOSIS — E785 Hyperlipidemia, unspecified: Secondary | ICD-10-CM | POA: Diagnosis not present

## 2012-10-11 DIAGNOSIS — M76899 Other specified enthesopathies of unspecified lower limb, excluding foot: Secondary | ICD-10-CM | POA: Insufficient documentation

## 2012-10-11 DIAGNOSIS — I2510 Atherosclerotic heart disease of native coronary artery without angina pectoris: Secondary | ICD-10-CM | POA: Insufficient documentation

## 2012-10-11 DIAGNOSIS — IMO0001 Reserved for inherently not codable concepts without codable children: Secondary | ICD-10-CM | POA: Diagnosis not present

## 2012-10-11 DIAGNOSIS — M545 Low back pain, unspecified: Secondary | ICD-10-CM | POA: Insufficient documentation

## 2012-10-11 DIAGNOSIS — I739 Peripheral vascular disease, unspecified: Secondary | ICD-10-CM | POA: Diagnosis not present

## 2012-10-11 DIAGNOSIS — G4733 Obstructive sleep apnea (adult) (pediatric): Secondary | ICD-10-CM | POA: Diagnosis not present

## 2012-10-11 DIAGNOSIS — I10 Essential (primary) hypertension: Secondary | ICD-10-CM | POA: Insufficient documentation

## 2012-10-11 DIAGNOSIS — IMO0002 Reserved for concepts with insufficient information to code with codable children: Secondary | ICD-10-CM | POA: Insufficient documentation

## 2012-10-11 DIAGNOSIS — M542 Cervicalgia: Secondary | ICD-10-CM | POA: Insufficient documentation

## 2012-10-11 DIAGNOSIS — R209 Unspecified disturbances of skin sensation: Secondary | ICD-10-CM | POA: Diagnosis not present

## 2012-10-11 DIAGNOSIS — Z951 Presence of aortocoronary bypass graft: Secondary | ICD-10-CM | POA: Diagnosis not present

## 2012-10-11 MED ORDER — HYDROCODONE-ACETAMINOPHEN 5-325 MG PO TABS: 1.0000 | ORAL_TABLET | Freq: Two times a day (BID) | ORAL | Status: DC

## 2012-10-11 NOTE — Patient Instructions (Signed)
Continue with your aquatic exercises 

## 2012-10-11 NOTE — Progress Notes (Signed)
Subjective:    Patient ID: Kara Bush, female    DOB: [birth year], 72 y.o.   MRN: [id number]  HPI The patient complains about chronic low back pain, neck pain and right shoulder blade pain . The patient also complains about intermittend, positional numbness and tingling in the C6 distribution.  The problem has been stable. The patient is doing water aerobics 3 times per week, and is paying attention to a good posture.  The pain around her right SI joint, has resolved. He uses the Flector patches when she has a flare up and they have helped her..  Pain Inventory Average Pain 4 Pain Right Now 4 My pain is intermittent  In the last 24 hours, has pain interfered with the following? General activity 2 Relation with others 0 Enjoyment of life 0 What TIME of day is your pain at its worst? morning Sleep (in general) Fair  Pain is worse with: bending Pain improves with: medication Relief from Meds: 6  Mobility walk without assistance  Function retired  Neuro/Psych depression anxiety  Prior Studies Any changes since last visit?  no  Physicians involved in your care Any changes since last visit?  no   Family History  Problem Relation Age of Onset  . Heart disease Father   . Cancer Paternal Aunt     BREAST   History   Social History  . Marital Status: Divorced    Spouse Name: N/A    Number of Children: N/A  . Years of Education: N/A   Social History Main Topics  . Smoking status: Never Smoker   . Smokeless tobacco: Never Used  . Alcohol Use: None  . Drug Use: None  . Sexually Active: None   Other Topics Concern  . None   Social History Narrative  . None   Past Surgical History  Procedure Laterality Date  . Hernia repair    . Tubal ligation      BTL  . Coronary artery bypass graft    . Coronary stent placement     Past Medical History  Diagnosis Date  . Hypertension   . CAD (coronary artery disease)   . Depression   . Ulcerative colitis    . Hyperlipidemia   . Chronic fatigue fibromyalgia syndrome   . OSA (obstructive sleep apnea)   . PAD (peripheral artery disease)   . S/P resection of aortic aneurysm   . Chest wall pain   . Cervicalgia   . Pes anserine bursitis   . Fibromyalgia   . Chronic pain syndrome   . Trochanteric bursitis    BP 134/67  Pulse 101  Resp 14  Ht 5\' 4"  (1.626 m)  Wt 180 lb (81.647 kg)  BMI 30.88 kg/m2  SpO2 95%     Review of Systems  Musculoskeletal: Positive for myalgias and arthralgias.  Psychiatric/Behavioral: Positive for dysphoric mood. The patient is nervous/anxious.   All other systems reviewed and are negative.       Objective:   Physical Exam Constitutional: She is oriented to person, place, and time. She appears well-developed and well-nourished.  HENT:  Head: Normocephalic.  Neck: Neck supple.  Musculoskeletal: She exhibits tenderness.  Neurological: She is alert and oriented to person, place, and time.  Skin: Skin is warm and dry.  Psychiatric: She has a normal mood and affect.  Symmetric normal motor tone is noted throughout, except increased muscle tone in trapezius bilateral. Normal muscle bulk. Muscle testing reveals 5/5 muscle strength of the upper  extremity, except wrist extension on the left, C6, and 5/5 of the lower extremity. Full range of motion in upper and lower extremities. ROM of spine is restricted. Fine motor movements are normal in both hands.  Sensory is intact and symmetric to light touch, pinprick and proprioception.  DTR in the upper and lower extremity are present and symmetric 3+. No clonus is noted.  Patient arises from chair without difficulty. Narrow based gait with normal arm swing bilateral , able to walk on heels and toes . Tandem walk is stable. No pronator drift. Rhomberg negative.  Good finger to nose and heel to shin testing. No tremor, dystaxia or dysmetria noted.         Assessment & Plan:  1.Cervical spondylosis, cervicalgia with  radiation to right scapular region. recent radiographs than 0-13 2013 show degenerative changes at C5-6 as well as at C6-7 without evidence of pathological motion with flexion extension views, they also show short pedicles.  2.Bilateral pes anserine bursitis.  3.Mild trochanteric bursitis/iliotibial band syndrome.  4.History of fibromyalgia.  5.Brisk reflexes,most likely cervical spinal stenosis, short pedicles.  6. Bilateral intermittent hand numbness: Carpal tunnel syndrome versus C6 radiculopathy mild  7. LBP, with SI inflammation/irritation on the right.  Advised patient to continue with water exercises 3x per week, also advised patient to keep a good posture showed her how to correct her posture.Also showed her some stretching exercises for her SI joint. Flector patches, I prescribed for inflammation/irritation of SI joint, and lumbar muscle strain, have given her good relief. Prescribed Robaxin for her muscle pain, at last visit, which she has tolerated well in the past, she uses it sparingly.  Follow up in 3 month.

## 2012-12-17 ENCOUNTER — Telehealth: Payer: Self-pay

## 2012-12-17 MED ORDER — HYDROCODONE-ACETAMINOPHEN 5-325 MG PO TABS: 1.0000 | ORAL_TABLET | Freq: Two times a day (BID) | ORAL | Status: DC

## 2012-12-17 NOTE — Telephone Encounter (Signed)
Patient called requesting hydrocodone refill.  Patient informed this was called in.

## 2012-12-23 DIAGNOSIS — L258 Unspecified contact dermatitis due to other agents: Secondary | ICD-10-CM | POA: Diagnosis not present

## 2012-12-29 ENCOUNTER — Other Ambulatory Visit: Payer: Self-pay | Admitting: Internal Medicine

## 2012-12-29 DIAGNOSIS — R0602 Shortness of breath: Secondary | ICD-10-CM | POA: Diagnosis not present

## 2012-12-29 DIAGNOSIS — Z79899 Other long term (current) drug therapy: Secondary | ICD-10-CM | POA: Diagnosis not present

## 2012-12-29 LAB — CBC
HCT: 39 % (ref 36.0–46.0)
Hemoglobin: 12.7 g/dL (ref 12.0–15.0)
MCV: 77.8 fL — ABNORMAL LOW (ref 78.0–100.0)
RBC: 5.01 MIL/uL (ref 3.87–5.11)
WBC: 9.1 10*3/uL (ref 4.0–10.5)

## 2012-12-30 ENCOUNTER — Telehealth: Payer: Self-pay | Admitting: Internal Medicine

## 2012-12-30 NOTE — Telephone Encounter (Signed)
Returned call.  Pt informed results have not been reviewed by Dr. Rennis Golden yet and she will receive a call after review and when they are forwarded to the nurse.  Pt verbalized understanding and agreed w/ plan.

## 2012-12-30 NOTE — Telephone Encounter (Signed)
Kara Bush is calling wanting to know the results of her blood test from yesterday. Please call her [phone]   Thanks

## 2012-12-31 ENCOUNTER — Telehealth: Payer: Self-pay | Admitting: Internal Medicine

## 2013-01-03 NOTE — Telephone Encounter (Signed)
Call to pt and informed per Dr. Rennis Golden.  See result note.

## 2013-01-07 ENCOUNTER — Encounter
Payer: Medicare Other | Attending: Physical Medicine and Rehabilitation | Admitting: Physical Medicine and Rehabilitation

## 2013-01-07 ENCOUNTER — Encounter: Payer: Self-pay | Admitting: Physical Medicine and Rehabilitation

## 2013-01-07 VITALS — BP 137/52 | HR 94 | Resp 14 | Ht 64.0 in | Wt 179.0 lb

## 2013-01-07 DIAGNOSIS — M461 Sacroiliitis, not elsewhere classified: Secondary | ICD-10-CM | POA: Diagnosis not present

## 2013-01-07 DIAGNOSIS — M47812 Spondylosis without myelopathy or radiculopathy, cervical region: Secondary | ICD-10-CM | POA: Insufficient documentation

## 2013-01-07 DIAGNOSIS — M25519 Pain in unspecified shoulder: Secondary | ICD-10-CM | POA: Diagnosis not present

## 2013-01-07 DIAGNOSIS — G8929 Other chronic pain: Secondary | ICD-10-CM | POA: Insufficient documentation

## 2013-01-07 DIAGNOSIS — Z5181 Encounter for therapeutic drug level monitoring: Secondary | ICD-10-CM | POA: Diagnosis not present

## 2013-01-07 DIAGNOSIS — M629 Disorder of muscle, unspecified: Secondary | ICD-10-CM | POA: Insufficient documentation

## 2013-01-07 DIAGNOSIS — M545 Low back pain, unspecified: Secondary | ICD-10-CM | POA: Diagnosis not present

## 2013-01-07 DIAGNOSIS — IMO0001 Reserved for inherently not codable concepts without codable children: Secondary | ICD-10-CM | POA: Insufficient documentation

## 2013-01-07 DIAGNOSIS — IMO0002 Reserved for concepts with insufficient information to code with codable children: Secondary | ICD-10-CM | POA: Insufficient documentation

## 2013-01-07 DIAGNOSIS — M2420 Disorder of ligament, unspecified site: Secondary | ICD-10-CM | POA: Insufficient documentation

## 2013-01-07 DIAGNOSIS — M76899 Other specified enthesopathies of unspecified lower limb, excluding foot: Secondary | ICD-10-CM | POA: Diagnosis not present

## 2013-01-07 DIAGNOSIS — R209 Unspecified disturbances of skin sensation: Secondary | ICD-10-CM | POA: Diagnosis not present

## 2013-01-07 DIAGNOSIS — Z79899 Other long term (current) drug therapy: Secondary | ICD-10-CM | POA: Diagnosis not present

## 2013-01-07 MED ORDER — HYDROCODONE-ACETAMINOPHEN 5-325 MG PO TABS: 1.0000 | ORAL_TABLET | Freq: Two times a day (BID) | ORAL | Status: DC

## 2013-01-07 NOTE — Patient Instructions (Signed)
You can try Arnica cream for your arthritic hand pain.

## 2013-01-07 NOTE — Progress Notes (Signed)
Subjective:    Patient ID: Kara Bush, female    DOB: 09/11/1940, 72 y.o.   MRN: [id number]  HPI The patient complains about chronic low back pain, neck pain and right shoulder blade pain . The patient also complains about intermittend, positional numbness and tingling in the C6 distribution.  The problem has been stable. The patient is doing water aerobics 3 times per week, and is paying attention to a good posture.  The pain around her right SI joint, has resolved. He uses the Flector patches when she has a flare up and they have helped her..  Pain Inventory Average Pain 3 Pain Right Now 4 My pain is aching  In the last 24 hours, has pain interfered with the following? General activity 1 Relation with others 0 Enjoyment of life 1 What TIME of day is your pain at its worst? daytime Sleep (in general) Good  Pain is worse with: unsure Pain improves with: medication Relief from Meds: 7  Mobility walk without assistance how many minutes can you walk? 20 ability to climb steps?  yes do you drive?  yes Do you have any goals in this area?  no  Function retired Do you have any goals in this area?  no  Neuro/Psych anxiety  Prior Studies Any changes since last visit?  no  Physicians involved in your care Any changes since last visit?  no   Family History  Problem Relation Age of Onset  . Heart disease Father   . Cancer Paternal Aunt     BREAST   History   Social History  . Marital Status: Divorced    Spouse Name: N/A    Number of Children: N/A  . Years of Education: N/A   Social History Main Topics  . Smoking status: Never Smoker   . Smokeless tobacco: Never Used  . Alcohol Use: None  . Drug Use: None  . Sexually Active: None   Other Topics Concern  . None   Social History Narrative  . None   Past Surgical History  Procedure Laterality Date  . Hernia repair    . Tubal ligation      BTL  . Coronary artery bypass graft    . Coronary stent  placement     Past Medical History  Diagnosis Date  . Hypertension   . CAD (coronary artery disease)   . Depression   . Ulcerative colitis   . Hyperlipidemia   . Chronic fatigue fibromyalgia syndrome   . OSA (obstructive sleep apnea)   . PAD (peripheral artery disease)   . S/P resection of aortic aneurysm   . Chest wall pain   . Cervicalgia   . Pes anserine bursitis   . Fibromyalgia   . Chronic pain syndrome   . Trochanteric bursitis    BP 137/52  Pulse 94  Resp 14  Ht 5\' 4"  (1.626 m)  Wt 179 lb (81.194 kg)  BMI 30.71 kg/m2  SpO2 96%     Review of Systems  Psychiatric/Behavioral: The patient is nervous/anxious.   All other systems reviewed and are negative.       Objective:   Physical Exam Constitutional: She is oriented to person, place, and time. She appears well-developed and well-nourished.  HENT:  Head: Normocephalic.  Neck: Neck supple.  Musculoskeletal: She exhibits tenderness.  Neurological: She is alert and oriented to person, place, and time.  Skin: Skin is warm and dry.  Psychiatric: She has a normal mood and affect.  Symmetric normal motor tone is noted throughout, except increased muscle tone in trapezius bilateral. Normal muscle bulk. Muscle testing reveals 5/5 muscle strength of the upper extremity, except wrist extension on the left, C6, and 5/5 of the lower extremity. Full range of motion in upper and lower extremities. ROM of spine is restricted. Fine motor movements are normal in both hands.  Sensory is intact and symmetric to light touch, pinprick and proprioception.  DTR in the upper and lower extremity are present and symmetric 3+. No clonus is noted.  Patient arises from chair without difficulty. Narrow based gait with normal arm swing bilateral , able to walk on heels and toes . Tandem walk is stable. No pronator drift. Rhomberg negative.  Good finger to nose and heel to shin testing. No tremor, dystaxia or dysmetria noted.          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 recent radiographs than 0-13 2013 show degenerative changes at C5-6 as well as at C6-7 without evidence of pathological motion with flexion extension views, they also show short pedicles.  2.Bilateral pes anserine bursitis.  3.Mild trochanteric bursitis/iliotibial band syndrome.  4.History of fibromyalgia.  5.Brisk reflexes,most likely cervical spinal stenosis, short pedicles.  6. Bilateral intermittent hand numbness: Carpal tunnel syndrome versus C6 radiculopathy mild  7. LBP, with SI inflammation/irritation on the right.  Advised patient to continue with water exercises 3x per week, also advised patient to keep a good posture showed her how to correct her posture.Also showed her some stretching exercises for her SI joint. Flector patches, I prescribed for inflammation/irritation of SI joint, and lumbar muscle strain, have given her good relief. Prescribed Robaxin for her muscle pain, at last visit, which she has tolerated well in the past, she uses it sparingly.  Follow up in 3 month.

## 2013-01-11 ENCOUNTER — Ambulatory Visit (INDEPENDENT_AMBULATORY_CARE_PROVIDER_SITE_OTHER): Payer: Medicare Other | Admitting: Cardiology

## 2013-01-11 ENCOUNTER — Encounter: Payer: Self-pay | Admitting: Cardiology

## 2013-01-11 VITALS — BP 118/72 | HR 84 | Ht 64.0 in | Wt 179.5 lb

## 2013-01-11 DIAGNOSIS — I2699 Other pulmonary embolism without acute cor pulmonale: Secondary | ICD-10-CM

## 2013-01-11 DIAGNOSIS — Z9889 Other specified postprocedural states: Secondary | ICD-10-CM

## 2013-01-11 DIAGNOSIS — I2510 Atherosclerotic heart disease of native coronary artery without angina pectoris: Secondary | ICD-10-CM | POA: Diagnosis not present

## 2013-01-11 DIAGNOSIS — Q7649 Other congenital malformations of spine, not associated with scoliosis: Secondary | ICD-10-CM

## 2013-01-11 DIAGNOSIS — D649 Anemia, unspecified: Secondary | ICD-10-CM | POA: Diagnosis not present

## 2013-01-11 DIAGNOSIS — I739 Peripheral vascular disease, unspecified: Secondary | ICD-10-CM

## 2013-01-11 DIAGNOSIS — M4302 Spondylolysis, cervical region: Secondary | ICD-10-CM

## 2013-01-11 DIAGNOSIS — I712 Thoracic aortic aneurysm, without rupture: Secondary | ICD-10-CM | POA: Insufficient documentation

## 2013-01-11 DIAGNOSIS — Z8679 Personal history of other diseases of the circulatory system: Secondary | ICD-10-CM

## 2013-01-11 HISTORY — DX: Other pulmonary embolism without acute cor pulmonale: I26.99

## 2013-01-11 HISTORY — DX: Anemia, unspecified: D64.9

## 2013-01-11 NOTE — Assessment & Plan Note (Signed)
Last ABIs normal in May 2013

## 2013-01-11 NOTE — Assessment & Plan Note (Signed)
Stable without SOB, no complaints.  Last DDimer was elevated 0.67 will continue Xarelto  X 3 months, recheck DDimer and possible stop Xarelto if DDImer normal and if Dr. Rennis Golden agrees. Follow up with Dr. Rennis Golden in 3 months.

## 2013-01-11 NOTE — Assessment & Plan Note (Signed)
Recent pain followed by Su Monks, PA

## 2013-01-11 NOTE — Assessment & Plan Note (Signed)
NO CHEST PAIN, NO sob,  Last nuc 08/2011 no ischemia, low risk scan

## 2013-01-11 NOTE — Progress Notes (Signed)
01/12/2013   PCP: Gaye Alken, MD   Chief Complaint  Patient presents with  . 3 month    fatigue    Primary Cardiologist:Dr. Ellene Route   HPI: 72 year old white female with a history of coronary artery disease as well as peripheral vascular disease. She had bypass grafting in 1998 and stents in 2005 2006 and 2000 710 negative nuclear stress test in 2013 she also has peripheral arterial disease and history of aortic iliac bypass in 1991. Last ABIs were made of 2013 were normal.  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  Fortunately she was asymptomatic, she was placed on anticoagulation with the Xarelto.   She has done well since that time.  Her last visit with Dr. Rennis Golden was in February and he was planning to recheck d-dimer and if it was still elevated she would need to continue her Xarelto for total of 6 months.  Her d-dimer continues to be elevated at 0.67. Initial d-dimer in February was 1.48.  Venous Dopplers were also done in February which were negative for DVT.    Today patient has no complaints no chest pain or shortness of breath she continues with water exercise or activity.  We discussed need to continue several different other 3 months for total of 6 we will plan to repeat d-dimer in 3 months.   Allergies  Allergen Reactions  . Atenolol     cholitis  . Penicillins Rash    Current Outpatient Prescriptions  Medication Sig Dispense Refill  . aspirin 81 MG tablet Take 81-324 mg by mouth daily.       . citalopram (CELEXA) 20 MG tablet Take 20 mg by mouth daily.        . Coenzyme Q10 (CO Q-10) 200 MG CAPS Take 1 capsule by mouth daily.      . diclofenac (FLECTOR) 1.3 % PTCH Place 1 patch onto the skin 2 (two) times daily.  30 patch  2  . diphenhydramine-acetaminophen (TYLENOL PM EXTRA STRENGTH) 25-500 MG TABS Take 1 tablet by mouth at bedtime.       Marland Kitchen ezetimibe (ZETIA) 10 MG tablet Take 10 mg by mouth daily.        . fish oil-omega-3 fatty acids 1000 MG capsule Take 1.2 g by mouth 2 (two) times daily.       Marland Kitchen HYDROcodone-acetaminophen (NORCO/VICODIN) 5-325 MG per tablet Take 1 tablet by mouth 2 (two) times daily. Must last 30 days.  60 tablet  1  . lisinopril (PRINIVIL,ZESTRIL) 10 MG tablet Take 5 mg by mouth daily.       . Loteprednol Etabonate (LOTEMAX OP) Apply 1 drop to eye as needed.      . methocarbamol (ROBAXIN) 500 MG tablet Take 1 tablet by mouth Every 8 hours as needed.      . metoprolol succinate (TOPROL-XL) 25 MG 24 hr tablet 12.5 mg daily.       . Multiple Vitamin (MULITIVITAMIN WITH MINERALS) TABS Take 1 tablet by mouth daily.        . naproxen sodium (ANAPROX) 220 MG tablet Take 220 mg by mouth 2 (two) times daily as needed. For pain.       Marland Kitchen pyridOXINE (VITAMIN B-6) 100 MG tablet Take 100 mg by mouth daily.      . simvastatin (ZOCOR) 40 MG tablet Take 40 mg by  mouth at bedtime.        . vitamin B-12 (CYANOCOBALAMIN) 1000 MCG tablet Take 1,000 mcg by mouth daily.      Carlena Hurl 20 MG TABS Take 20 mg by mouth daily.       Marland Kitchen zolpidem (AMBIEN) 10 MG tablet Take 5 mg by mouth at bedtime as needed. For sleep.       No current facility-administered medications for this visit.    Past Medical History  Diagnosis Date  . Hypertension   . CAD (coronary artery disease)     hx CABG 1998, last cath 2007 with PCI and stenting to the proximal segment of the vein graft to the diagonal branch. DES Taxus was placed  . Depression   . Ulcerative colitis   . Hyperlipidemia   . Chronic fatigue fibromyalgia syndrome   . OSA (obstructive sleep apnea)   . PAD (peripheral artery disease)   . S/P resection of aortic aneurysm   . Chest wall pain   . Cervicalgia   . Pes anserine bursitis   . Fibromyalgia   . Chronic pain syndrome   . Trochanteric bursitis   . Pulmonary embolism 01/11/2013  . Anemia, improved 01/11/2013    Past  Surgical History  Procedure Laterality Date  . Hernia repair    . Tubal ligation      BTL  . Coronary artery bypass graft  1998    LIMA-LAD;VG-DIAG & RCA  . Coronary stent placement  2005    TO LAD & LCX-STENTS,  . Aortobiexternal iliac bypass  1991  . Coronary angioplasty with stent placement  2006    to LAD, ATRETIC LIMA AT THAT TIME ALSO  . Coronary angioplasty with stent placement  2007    STENT TO PROX VG-DIAG, OTHER STENTS PATENT    ZOX:[id number]:UJ colds or fevers, no weight changes Skin:no rashes or ulcers HEENT:no blurred vision, no congestion CV:see HPI PUL:see HPI GI:no diarrhea constipation or melena, no indigestion GU:no hematuria, no dysuria MS:no joint pain, no claudication Neuro:no syncope, no lightheadedness Endo:no diabetes, no thyroid disease  PHYSICAL EXAM BP 118/72  Pulse 84  Ht 5\' 4"  (1.626 m)  Wt 179 lb 8 oz (81.421 kg)  BMI 30.8 kg/m2 General:Pleasant affect, NAD Skin:Warm and dry, brisk capillary refill HEENT:normocephalic, sclera clear, mucus membranes moist Neck:supple, no JVD, no bruits  Heart:S1S2 RRR without murmur, gallup, rub or click Lungs:clear without rales, rhonchi, or wheezes WJX:BJYN, non tender, + BS, do not palpate liver spleen or masses Ext:no lower ext edema, 2+ pedal pulses, 2+ radial pulses Neuro:alert and oriented, MAE, follows commands, + facial symmetry   ASSESSMENT AND PLAN Pulmonary embolism, 09/21/12, anticoagulation on Xarelto Stable without SOB, no complaints.  Last DDimer was elevated 0.67 will continue Xarelto  X 3 months, recheck DDimer and possible stop Xarelto if DDImer normal and if Dr. Rennis Golden agrees. Follow up with Dr. Rennis Golden in 3 months.  Anemia, improved H/H improved from last eval.  No complaints of bleeding on Xarelto.  CAD (coronary artery disease), CABG 1998, STENTS MULTIPLE SITES SINCE.  NO CHEST PAIN, NO sob,  Last nuc 08/2011 no ischemia, low risk scan  PAD (peripheral artery disease), aortic-iliac  bypass 1991, mild carotid bruits Last ABIs normal in May 2013  S/P resection of aortic aneurysm, 1998 prior to CABG No complaints stable  Ascending aortic aneurysm, 4.1 cm Stable.  CT done 09/2012  Cervical spondylolysis Recent pain followed by Su Monks, PA

## 2013-01-11 NOTE — Assessment & Plan Note (Signed)
H/H improved from last eval.  No complaints of bleeding on Xarelto.

## 2013-01-11 NOTE — Assessment & Plan Note (Signed)
Stable.  CT done 09/2012

## 2013-01-11 NOTE — Patient Instructions (Addendum)
Have CBC and DDimer drawn the week before appt with Dr. Rennis Golden  Follow up with Dr. Rennis Golden in 3 months, hope to stop Xarelto at that time.  Call if any problems or questions in the mean time.

## 2013-01-11 NOTE — Assessment & Plan Note (Signed)
No complaints stable

## 2013-01-12 ENCOUNTER — Encounter: Payer: Self-pay | Admitting: Cardiology

## 2013-01-25 DIAGNOSIS — R5381 Other malaise: Secondary | ICD-10-CM | POA: Diagnosis not present

## 2013-01-25 DIAGNOSIS — M76899 Other specified enthesopathies of unspecified lower limb, excluding foot: Secondary | ICD-10-CM | POA: Diagnosis not present

## 2013-01-25 DIAGNOSIS — G4700 Insomnia, unspecified: Secondary | ICD-10-CM | POA: Diagnosis not present

## 2013-01-25 DIAGNOSIS — R5383 Other fatigue: Secondary | ICD-10-CM | POA: Diagnosis not present

## 2013-01-25 DIAGNOSIS — IMO0001 Reserved for inherently not codable concepts without codable children: Secondary | ICD-10-CM | POA: Diagnosis not present

## 2013-02-07 DIAGNOSIS — K5190 Ulcerative colitis, unspecified, without complications: Secondary | ICD-10-CM | POA: Diagnosis not present

## 2013-02-07 DIAGNOSIS — I2699 Other pulmonary embolism without acute cor pulmonale: Secondary | ICD-10-CM | POA: Diagnosis not present

## 2013-02-11 ENCOUNTER — Other Ambulatory Visit: Payer: Self-pay | Admitting: Internal Medicine

## 2013-02-11 NOTE — Telephone Encounter (Signed)
Rx was sent to pharmacy electronically. 

## 2013-03-14 DIAGNOSIS — Z79899 Other long term (current) drug therapy: Secondary | ICD-10-CM | POA: Diagnosis not present

## 2013-03-14 DIAGNOSIS — I2699 Other pulmonary embolism without acute cor pulmonale: Secondary | ICD-10-CM | POA: Diagnosis not present

## 2013-03-14 DIAGNOSIS — I10 Essential (primary) hypertension: Secondary | ICD-10-CM | POA: Diagnosis not present

## 2013-03-14 DIAGNOSIS — G4700 Insomnia, unspecified: Secondary | ICD-10-CM | POA: Diagnosis not present

## 2013-03-14 DIAGNOSIS — F3289 Other specified depressive episodes: Secondary | ICD-10-CM | POA: Diagnosis not present

## 2013-03-14 DIAGNOSIS — E7800 Pure hypercholesterolemia, unspecified: Secondary | ICD-10-CM | POA: Diagnosis not present

## 2013-03-14 DIAGNOSIS — E559 Vitamin D deficiency, unspecified: Secondary | ICD-10-CM | POA: Diagnosis not present

## 2013-03-14 DIAGNOSIS — F329 Major depressive disorder, single episode, unspecified: Secondary | ICD-10-CM | POA: Diagnosis not present

## 2013-03-16 ENCOUNTER — Other Ambulatory Visit: Payer: Self-pay | Admitting: Physical Medicine and Rehabilitation

## 2013-03-16 MED ORDER — HYDROCODONE-ACETAMINOPHEN 5-325 MG PO TABS: ORAL_TABLET | ORAL | Status: DC

## 2013-03-21 ENCOUNTER — Encounter: Payer: Self-pay | Admitting: Internal Medicine

## 2013-03-22 DIAGNOSIS — R7989 Other specified abnormal findings of blood chemistry: Secondary | ICD-10-CM | POA: Diagnosis not present

## 2013-03-24 DIAGNOSIS — N390 Urinary tract infection, site not specified: Secondary | ICD-10-CM | POA: Diagnosis not present

## 2013-03-24 DIAGNOSIS — R300 Dysuria: Secondary | ICD-10-CM | POA: Diagnosis not present

## 2013-03-25 ENCOUNTER — Encounter: Payer: Self-pay | Admitting: *Deleted

## 2013-03-29 ENCOUNTER — Encounter: Payer: Self-pay | Admitting: Internal Medicine

## 2013-03-29 ENCOUNTER — Ambulatory Visit (INDEPENDENT_AMBULATORY_CARE_PROVIDER_SITE_OTHER): Payer: Medicare Other | Admitting: Internal Medicine

## 2013-03-29 VITALS — BP 140/70 | HR 108 | Ht 64.0 in | Wt 181.2 lb

## 2013-03-29 DIAGNOSIS — I2699 Other pulmonary embolism without acute cor pulmonale: Secondary | ICD-10-CM | POA: Diagnosis not present

## 2013-03-29 DIAGNOSIS — H2510 Age-related nuclear cataract, unspecified eye: Secondary | ICD-10-CM | POA: Diagnosis not present

## 2013-03-29 DIAGNOSIS — R5381 Other malaise: Secondary | ICD-10-CM | POA: Diagnosis not present

## 2013-03-29 DIAGNOSIS — R5383 Other fatigue: Secondary | ICD-10-CM | POA: Diagnosis not present

## 2013-03-29 DIAGNOSIS — N390 Urinary tract infection, site not specified: Secondary | ICD-10-CM | POA: Diagnosis not present

## 2013-03-29 DIAGNOSIS — I2510 Atherosclerotic heart disease of native coronary artery without angina pectoris: Secondary | ICD-10-CM

## 2013-03-29 DIAGNOSIS — R7401 Elevation of levels of liver transaminase levels: Secondary | ICD-10-CM | POA: Diagnosis not present

## 2013-03-29 NOTE — Progress Notes (Signed)
03/29/2013   PCP: Gaye Alken, MD   Chief Complaint  Patient presents with  . 2 months    saw Vernona Rieger in June; SOB, nausea/dry heaves, fever, UTI last week; fatigued; lab work    Primary Cardiologist:Dr. Ellene Route   HPI: 72 year old white female with a history of coronary artery disease as well as peripheral vascular disease. She had bypass grafting in 1998 and stents in 2005 2006 and 2000 710 negative nuclear stress test in 2013 she also has peripheral arterial disease and history of aortic iliac bypass in 1991. Last ABIs were made of 2013 were normal.  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  Fortunately she was asymptomatic, she was placed on anticoagulation with the Xarelto.   She has done well since that time.  Her last visit with Dr. Rennis Golden was in February and he was planning to recheck d-dimer and if it was still elevated she would need to continue her Xarelto for total of 6 months.  Her d-dimer continues to be elevated at 0.67. Initial d-dimer in February was 1.48.  Venous Dopplers were also done in February which were negative for DVT.    Mrs. Hornstein just had a repeat D. dimer which is still elevated at 1.48, actually slightly higher than it had been before. She tells a story that over the past several weeks she's had a urinary tract infection and started taking Bactrim for which she is more nauseated and is actually thrown up. She reports some tenderness across her epigastric area as well as the costovertebral angles. She's also had fever, chills and some sweating at night. She is planning on seeing her primary care doctor back in the office this afternoon. She denies any worsening shortness of breath or chest pain. He's had no bleeding complications on Xarelto.   Allergies  Allergen Reactions  . Atenolol     cholitis  . Cymbalta [Duloxetine Hcl]     . Lyrica [Pregabalin]   . Penicillins Rash    Current Outpatient Prescriptions  Medication Sig Dispense Refill  . aspirin 81 MG tablet Take 81 mg by mouth daily.       . citalopram (CELEXA) 20 MG tablet Take 20 mg by mouth daily.        . Coenzyme Q10 (CO Q-10) 200 MG CAPS Take 1 capsule by mouth daily.      . diclofenac (FLECTOR) 1.3 % PTCH Place 1 patch onto the skin 2 (two) times daily.  30 patch  2  . diphenhydramine-acetaminophen (TYLENOL PM EXTRA STRENGTH) 25-500 MG TABS Take 1 tablet by mouth at bedtime.      . fish oil-omega-3 fatty acids 1000 MG capsule Take 1.2 g by mouth 2 (two) times daily.       Marland Kitchen HYDROcodone-acetaminophen (NORCO/VICODIN) 5-325 MG per tablet TAKE 1 TABLET BY MOUTH TWICE A DAY MUST LAST 30 DAYS  60 tablet  0  . lisinopril (PRINIVIL,ZESTRIL) 10 MG tablet Take 5 mg by mouth daily.       . Loteprednol Etabonate (LOTEMAX OP) Apply 1 drop to eye as needed.      . methocarbamol (ROBAXIN) 500 MG tablet Take 1 tablet by mouth Every 8 hours as needed.      . metoprolol succinate (TOPROL-XL) 25 MG 24 hr tablet 12.5 mg daily.       . Multiple Vitamin (  MULITIVITAMIN WITH MINERALS) TABS Take 1 tablet by mouth daily.        . naproxen sodium (ANAPROX) 220 MG tablet Take 220 mg by mouth 2 (two) times daily as needed. For pain.       Marland Kitchen pyridOXINE (VITAMIN B-6) 100 MG tablet Take 100 mg by mouth daily.      . simvastatin (ZOCOR) 40 MG tablet TAKE 1 TABLET BY MOUTH EVERY DAY  90 tablet  2  . sulfamethoxazole-trimethoprim (BACTRIM DS) 800-160 MG per tablet Take 2 tablets by mouth daily.      . vitamin B-12 (CYANOCOBALAMIN) 1000 MCG tablet Take 1,000 mcg by mouth daily.      Carlena Hurl 20 MG TABS Take 20 mg by mouth daily.       Marland Kitchen ZETIA 10 MG tablet TAKE 1 TABLET BY MOUTH EVERY DAY  90 tablet  2  . zolpidem (AMBIEN) 10 MG tablet Take 5 mg by mouth at bedtime as needed. For sleep.       No current facility-administered medications for this visit.    Past Medical History   Diagnosis Date  . Hypertension   . CAD (coronary artery disease)     hx CABG 1998, last cath 2007 with PCI and stenting to the proximal segment of the vein graft to the diagonal branch. DES Taxus was placed  . Depression   . Ulcerative colitis   . Hyperlipidemia   . Chronic fatigue fibromyalgia syndrome   . OSA (obstructive sleep apnea)   . PAD (peripheral artery disease)   . S/P resection of aortic aneurysm   . Chest wall pain   . Cervicalgia   . Pes anserine bursitis   . Fibromyalgia   . Chronic pain syndrome   . Trochanteric bursitis   . Pulmonary embolism 01/11/2013  . Anemia, improved 01/11/2013  . History of nuclear stress test 08/07/2011    lexiscan; no evidence of ischemia or infarct; low risk     Past Surgical History  Procedure Laterality Date  . Hernia repair    . Tubal ligation      BTL  . Coronary artery bypass graft  1998    LIMA-LAD;VG-DIAG & RCA  . Coronary stent placement  2005    TO LAD & LCX-STENTS,  . Aortobiexternal iliac bypass  1991  . Coronary angioplasty with stent placement  2006    to LAD, ATRETIC LIMA AT THAT TIME ALSO  . Coronary angioplasty with stent placement  2007    STENT TO PROX VG-DIAG, OTHER STENTS PATENT  . Cholecystectomy  1997    VQQ:[id number]:FI colds or fevers, no weight changes Skin:no rashes or ulcers HEENT:no blurred vision, no congestion CV:see HPI PUL:see HPI GI:no diarrhea constipation or melena, no indigestion GU:no hematuria, no dysuria MS:no joint pain, no claudication Neuro:no syncope, no lightheadedness Endo:no diabetes, no thyroid disease  PHYSICAL EXAM BP 140/70  Pulse 108  Ht 5\' 4"  (1.626 m)  Wt 181 lb 3.2 oz (82.192 kg)  BMI 31.09 kg/m2 General:Pleasant affect, NAD Skin:Warm and dry, brisk capillary refill, pale HEENT:normocephalic, sclera clear, mucus membranes moist Neck:supple, no JVD, no bruits  Heart:S1S2 RRR without murmur, gallup, rub or click Lungs:clear without rales, rhonchi, or  wheezes EPP:IRJJ, non tender, + BS, do not palpate liver spleen or masses, +CVA tenderness bilaterally Ext:no lower ext edema, 2+ pedal pulses, 2+ radial pulses Neuro:alert and oriented, MAE, follows commands, + facial symmetry   ASSESSMENT AND PLAN Patient Active Problem List   Diagnosis Date Noted  .  Pulmonary embolism, 09/21/12, anticoagulation on Xarelto 01/11/2013  . Anemia, improved 01/11/2013  . Ascending aortic aneurysm, 4.1 cm 01/11/2013  . 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 Chronic periscapular pain 10/15/2011  . Chest wall pain   . S/P resection of aortic aneurysm, 1998 prior to CABG   . PAD (peripheral artery disease), aortic-iliac bypass 1991, mild carotid bruits   . OSA (obstructive sleep apnea)   . Chronic fatigue fibromyalgia syndrome   . Hyperlipidemia   . Ulcerative colitis   . Depression   . CAD (coronary artery disease), CABG 1998, STENTS MULTIPLE SITES SINCE.    Marland Kitchen Hypertension    PLAN: 1. Mrs. Karger appears to have urinary tract infection and tenderness over the CVA areas bilaterally which could indicate early pyelonephritis. She has been taking Bactrim which I suspect is contributing to her nausea and vomiting. She may need to switch to a different medication for her UTI. She is planning to followup with her primary care provider who is managing this today. Her d-dimer is elevated, however this may be due to acute infection as it is an acute phase reactant. It's difficult to tell whether her PE has resolved based on this test alone. I would recommend treating her UTI and resolving these issues before we can retest her to see if her PE has resolved. In the meantime I would keep her on Xarelto. He may have to get another CT scan with contrast to formally evaluate for persistent PE or to see whether it has resolved.  Chrystie Nose, MD, [facility] Attending Cardiologist [facility]

## 2013-03-29 NOTE — Patient Instructions (Addendum)
Your physician wants you to follow-up in: 3 months. You will receive a reminder letter in the mail two months in advance. If you don't receive a letter, please call our office to schedule the follow-up appointment.   

## 2013-03-31 DIAGNOSIS — R7989 Other specified abnormal findings of blood chemistry: Secondary | ICD-10-CM | POA: Diagnosis not present

## 2013-03-31 DIAGNOSIS — R7401 Elevation of levels of liver transaminase levels: Secondary | ICD-10-CM | POA: Diagnosis not present

## 2013-04-07 DIAGNOSIS — R7989 Other specified abnormal findings of blood chemistry: Secondary | ICD-10-CM | POA: Diagnosis not present

## 2013-04-07 DIAGNOSIS — R300 Dysuria: Secondary | ICD-10-CM | POA: Diagnosis not present

## 2013-04-07 DIAGNOSIS — R7401 Elevation of levels of liver transaminase levels: Secondary | ICD-10-CM | POA: Diagnosis not present

## 2013-04-08 ENCOUNTER — Encounter
Payer: Medicare Other | Attending: Physical Medicine and Rehabilitation | Admitting: Physical Medicine and Rehabilitation

## 2013-04-08 ENCOUNTER — Encounter: Payer: Self-pay | Admitting: Physical Medicine and Rehabilitation

## 2013-04-08 VITALS — BP 141/72 | HR 88 | Resp 16 | Ht 64.0 in | Wt 182.0 lb

## 2013-04-08 DIAGNOSIS — IMO0001 Reserved for inherently not codable concepts without codable children: Secondary | ICD-10-CM | POA: Diagnosis not present

## 2013-04-08 DIAGNOSIS — M25519 Pain in unspecified shoulder: Secondary | ICD-10-CM | POA: Insufficient documentation

## 2013-04-08 DIAGNOSIS — IMO0002 Reserved for concepts with insufficient information to code with codable children: Secondary | ICD-10-CM | POA: Diagnosis not present

## 2013-04-08 DIAGNOSIS — G4733 Obstructive sleep apnea (adult) (pediatric): Secondary | ICD-10-CM | POA: Diagnosis not present

## 2013-04-08 DIAGNOSIS — G8929 Other chronic pain: Secondary | ICD-10-CM | POA: Diagnosis not present

## 2013-04-08 DIAGNOSIS — M2420 Disorder of ligament, unspecified site: Secondary | ICD-10-CM | POA: Diagnosis not present

## 2013-04-08 DIAGNOSIS — M545 Low back pain, unspecified: Secondary | ICD-10-CM | POA: Diagnosis not present

## 2013-04-08 DIAGNOSIS — M47812 Spondylosis without myelopathy or radiculopathy, cervical region: Secondary | ICD-10-CM

## 2013-04-08 DIAGNOSIS — I2510 Atherosclerotic heart disease of native coronary artery without angina pectoris: Secondary | ICD-10-CM | POA: Insufficient documentation

## 2013-04-08 DIAGNOSIS — I739 Peripheral vascular disease, unspecified: Secondary | ICD-10-CM | POA: Insufficient documentation

## 2013-04-08 DIAGNOSIS — M542 Cervicalgia: Secondary | ICD-10-CM | POA: Diagnosis not present

## 2013-04-08 DIAGNOSIS — Z951 Presence of aortocoronary bypass graft: Secondary | ICD-10-CM | POA: Diagnosis not present

## 2013-04-08 DIAGNOSIS — R209 Unspecified disturbances of skin sensation: Secondary | ICD-10-CM | POA: Diagnosis not present

## 2013-04-08 DIAGNOSIS — M76899 Other specified enthesopathies of unspecified lower limb, excluding foot: Secondary | ICD-10-CM | POA: Insufficient documentation

## 2013-04-08 DIAGNOSIS — I10 Essential (primary) hypertension: Secondary | ICD-10-CM | POA: Insufficient documentation

## 2013-04-08 DIAGNOSIS — M629 Disorder of muscle, unspecified: Secondary | ICD-10-CM | POA: Diagnosis not present

## 2013-04-08 MED ORDER — HYDROCODONE-ACETAMINOPHEN 5-325 MG PO TABS: ORAL_TABLET | ORAL | Status: DC

## 2013-04-08 NOTE — Patient Instructions (Signed)
Continue with your aquatic exercising 

## 2013-04-08 NOTE — Progress Notes (Signed)
Subjective:    Patient ID: Kara Bush, female    DOB: 1941-05-19, 72 y.o.   MRN: [id number]  HPI The patient complains about chronic low back pain, neck pain and right shoulder blade pain . The patient also complains about intermittend, positional numbness and tingling in the C6 distribution.  The problem has been stable. The patient is doing water aerobics 3 times per week, and is paying attention to a good posture.  The pain around her right SI joint, has resolved. She uses the Flector patches when she has a flare up and they have helped her.  She reports that she has an UTI, first it was treated with Bactrim, patient got nauseated and vomitted, her Liver enzymes got elevated too. Now she is on Cipro.  Pain Inventory Average Pain 3 Pain Right Now 3 My pain is aching  In the last 24 hours, has pain interfered with the following? General activity 1 Relation with others 0 Enjoyment of life 1 What TIME of day is your pain at its worst? morning Sleep (in general) Good  Pain is worse with: bending Pain improves with: medication Relief from Meds: 5  Mobility walk without assistance how many minutes can you walk? 10 ability to climb steps?  yes do you drive?  yes Do you have any goals in this area?  no  Function retired  Neuro/Psych No problems in this area  Prior Studies Any changes since last visit?  no  Physicians involved in your care Any changes since last visit?  no   Family History  Problem Relation Age of Onset  . Heart disease Father   . Heart attack Father     @ 26  . Cancer Paternal Aunt     BREAST  . Heart failure Mother     at 42  . Healthy Sister   . Healthy Brother   . Heart failure Maternal Grandmother   . Heart attack Maternal Grandfather   . Heart attack Paternal Grandmother     at 16   History   Social History  . Marital Status: Divorced    Spouse Name: N/A    Number of Children: 2  . Years of Education: college     Occupational History  .     Social History Main Topics  . Smoking status: Never Smoker   . Smokeless tobacco: Never Used  . Alcohol Use: No  . Drug Use: No  . Sexual Activity: None   Other Topics Concern  . None   Social History Narrative  . None   Past Surgical History  Procedure Laterality Date  . Hernia repair    . Tubal ligation      BTL  . Coronary artery bypass graft  1998    LIMA-LAD;VG-DIAG & RCA  . Coronary stent placement  2005    TO LAD & LCX-STENTS,  . Aortobiexternal iliac bypass  1991  . Coronary angioplasty with stent placement  2006    to LAD, ATRETIC LIMA AT THAT TIME ALSO  . Coronary angioplasty with stent placement  2007    STENT TO PROX VG-DIAG, OTHER STENTS PATENT  . Cholecystectomy  1997   Past Medical History  Diagnosis Date  . Hypertension   . CAD (coronary artery disease)     hx CABG 1998, last cath 2007 with PCI and stenting to the proximal segment of the vein graft to the diagonal branch. DES Taxus was placed  . Depression   . Ulcerative colitis   .  Hyperlipidemia   . Chronic fatigue fibromyalgia syndrome   . OSA (obstructive sleep apnea)   . PAD (peripheral artery disease)   . S/P resection of aortic aneurysm   . Chest wall pain   . Cervicalgia   . Pes anserine bursitis   . Fibromyalgia   . Chronic pain syndrome   . Trochanteric bursitis   . Pulmonary embolism 01/11/2013  . Anemia, improved 01/11/2013  . History of nuclear stress test 08/07/2011    lexiscan; no evidence of ischemia or infarct; low risk    BP 141/72  Pulse 88  Resp 16  Ht 5\' 4"  (1.626 m)  Wt 182 lb (82.555 kg)  BMI 31.22 kg/m2  SpO2 95%     Review of Systems  Genitourinary: Positive for dysuria.  All other systems reviewed and are negative.       Objective:   Physical Exam Constitutional: She is oriented to person, place, and time. She appears well-developed and well-nourished.  HENT:  Head: Normocephalic.  Neck: Neck supple.  Musculoskeletal:  She exhibits tenderness.  Neurological: She is alert and oriented to person, place, and time.  Skin: Skin is warm and dry.  Psychiatric: She has a normal mood and affect.  Symmetric normal motor tone is noted throughout, except increased muscle tone in trapezius bilateral. Normal muscle bulk. Muscle testing reveals 5/5 muscle strength of the upper extremity, except wrist extension on the left, C6, and 5/5 of the lower extremity. Full range of motion in upper and lower extremities. ROM of spine is restricted. Fine motor movements are normal in both hands.  Sensory is intact and symmetric to light touch, pinprick and proprioception.  DTR in the upper and lower extremity are present and symmetric 3+. No clonus is noted.  Patient arises from chair without difficulty. Narrow based gait with normal arm swing bilateral , able to walk on heels and toes . Tandem walk is stable. No pronator drift. Rhomberg negative.  Good finger to nose and heel to shin testing. No tremor, dystaxia or dysmetria noted.         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 recent radiographs than 0-13 2013 show degenerative changes at C5-6 as well as at C6-7 without evidence of pathological motion with flexion extension views, they also show short pedicles.  2.Bilateral pes anserine bursitis.  3.Mild trochanteric bursitis/iliotibial band syndrome.  4.History of fibromyalgia.  5.Brisk reflexes,most likely cervical spinal stenosis, short pedicles.  6. Bilateral intermittent hand numbness: Carpal tunnel syndrome versus C6 radiculopathy mild  7. LBP, with SI inflammation/irritation on the right. 8. UTI, treated with Cipro, now, by PCP  Advised patient to continue with water exercises 3x per week, also advised patient to keep a good posture showed her how to correct her posture.Also showed her some stretching exercises for her SI joint. Flector patches, I prescribed for  inflammation/irritation of SI joint, and lumbar muscle strain, have given her good relief. Prescribed Robaxin for her muscle pain, at last visit, which she has tolerated well in the past, she uses it sparingly.  Refilled Hydrocodone 5/325, # 60, gave her prescription for another month to be filled when due Follow up in 2 month.

## 2013-04-14 DIAGNOSIS — D72829 Elevated white blood cell count, unspecified: Secondary | ICD-10-CM | POA: Diagnosis not present

## 2013-04-18 ENCOUNTER — Telehealth: Payer: Self-pay | Admitting: Infectious Disease

## 2013-04-18 DIAGNOSIS — R300 Dysuria: Secondary | ICD-10-CM | POA: Diagnosis not present

## 2013-04-18 DIAGNOSIS — N390 Urinary tract infection, site not specified: Secondary | ICD-10-CM | POA: Diagnosis not present

## 2013-04-18 NOTE — Telephone Encounter (Signed)
Pt with raoultella ornithinolytica UTI with recurrence referred by [facility].   Can we schedule this pt later this week with me possibly wed or Thursday. Will be a new one but wherever we can put them

## 2013-04-20 ENCOUNTER — Ambulatory Visit (INDEPENDENT_AMBULATORY_CARE_PROVIDER_SITE_OTHER): Payer: Medicare Other | Admitting: Infectious Disease

## 2013-04-20 ENCOUNTER — Encounter: Payer: Self-pay | Admitting: Infectious Disease

## 2013-04-20 ENCOUNTER — Other Ambulatory Visit: Payer: Self-pay | Admitting: Infectious Disease

## 2013-04-20 VITALS — BP 136/95 | HR 89 | Temp 98.8°F | Wt 182.0 lb

## 2013-04-20 DIAGNOSIS — K5190 Ulcerative colitis, unspecified, without complications: Secondary | ICD-10-CM | POA: Insufficient documentation

## 2013-04-20 DIAGNOSIS — R509 Fever, unspecified: Secondary | ICD-10-CM | POA: Diagnosis not present

## 2013-04-20 DIAGNOSIS — M797 Fibromyalgia: Secondary | ICD-10-CM | POA: Insufficient documentation

## 2013-04-20 DIAGNOSIS — K759 Inflammatory liver disease, unspecified: Secondary | ICD-10-CM | POA: Insufficient documentation

## 2013-04-20 DIAGNOSIS — Z113 Encounter for screening for infections with a predominantly sexual mode of transmission: Secondary | ICD-10-CM | POA: Diagnosis not present

## 2013-04-20 DIAGNOSIS — B20 Human immunodeficiency virus [HIV] disease: Secondary | ICD-10-CM

## 2013-04-20 DIAGNOSIS — I714 Abdominal aortic aneurysm, without rupture: Secondary | ICD-10-CM | POA: Insufficient documentation

## 2013-04-20 DIAGNOSIS — Z23 Encounter for immunization: Secondary | ICD-10-CM | POA: Diagnosis not present

## 2013-04-20 DIAGNOSIS — G4733 Obstructive sleep apnea (adult) (pediatric): Secondary | ICD-10-CM | POA: Insufficient documentation

## 2013-04-20 DIAGNOSIS — C4491 Basal cell carcinoma of skin, unspecified: Secondary | ICD-10-CM | POA: Insufficient documentation

## 2013-04-20 DIAGNOSIS — N390 Urinary tract infection, site not specified: Secondary | ICD-10-CM | POA: Diagnosis not present

## 2013-04-20 DIAGNOSIS — I2510 Atherosclerotic heart disease of native coronary artery without angina pectoris: Secondary | ICD-10-CM | POA: Insufficient documentation

## 2013-04-20 LAB — HIV ANTIBODY (ROUTINE TESTING W REFLEX): HIV: NONREACTIVE

## 2013-04-20 NOTE — Progress Notes (Signed)
Subjective:    Patient ID: Kara Bush, female    DOB: February 14, 1941, 72 y.o.   MRN: [id number]  Urinary Tract Infection  Associated symptoms include chills and urgency. Pertinent negatives include no flank pain, hematuria, nausea or vomiting.    Kara Bush is a 72 year old Caucasian lady with a past medical history significant for ulcerative colitis, abdominal aortic aneurysm status post resection with graft placement, coronary artery disease status post coronary artery bypass grafting, recently diagnosed pulmonary embolism 2 approximately 2 weeks ago Had developed symptoms of fevers chills nausea urinary urgency suprapubic discomfort and frequent urination. She was evaluated but treated by [facility] physicians in late August and urinalysis showed pyuria and urine culture grew 100,000 colony-forming units of RAOLTELLA ORNITHNOLYTICA which was resistant to amoxicillin Augmentin cefazolin cephalothin Bactrim but otherwise sensitive to cefepime ceftriaxone cefuroxime ciprofloxacin imipenem Zosyn and tetracycline tobramycin and Bactrim. She was given a course of Bactrim but persisted in having fevers. Bactrim was discontinued and then approximately 3 days after discontinuation of the Bactrim she again developed urinary discomfort and frequency. She was seen by Dr. Luciana Axe at the [facility] practice and urinalysis and culture were obtained. She again grew the same organism at 90,000 colony-forming units this time. Sensitivities were identical. Patient was sent home on ciprofloxacin for a seven-day course. Shortly after stopping this course of antibiotics on last Thursday she then again developed symptoms on Sunday of frequent urination and suprapubic discomfort. She was seen by Dr. Luciana Axe apparently urinalysis again showed pyuria and cultures now been going a gram-negative rod in urine although the species is not yet identified. She feels generally better although she has wondered if she might be developing a fever  today. She has had no measured temperature she is without nausea vomiting. No flank pain. We are consulted to assist the workup and management of recurrence of this unusual pathogen from urine. Her last urinary tract infection prior to this recent episode was approximately 2 years ago. Of note she is on no immunosuppressive drugs for her ulcerative colitis. She's been trying to "manage this by removing dietary materials and also reducing certain medications in her drug regimen she is followed by Dr. Elnoria Howard for  This.   Review of Systems  Constitutional: Positive for fever, chills, diaphoresis, activity change and fatigue. Negative for appetite change and unexpected weight change.  HENT: Negative for congestion, sore throat, rhinorrhea, sneezing, trouble swallowing and sinus pressure.   Eyes: Negative for photophobia and visual disturbance.  Respiratory: Negative for cough, chest tightness, shortness of breath, wheezing and stridor.   Cardiovascular: Negative for chest pain, palpitations and leg swelling.  Gastrointestinal: Negative for nausea, vomiting, abdominal pain, diarrhea, constipation, blood in stool, abdominal distention and anal bleeding.  Genitourinary: Positive for dysuria and urgency. Negative for hematuria, flank pain and difficulty urinating.  Musculoskeletal: Negative for myalgias, back pain, joint swelling, arthralgias and gait problem.  Skin: Negative for color change, pallor, rash and wound.  Neurological: Negative for dizziness, tremors, weakness and light-headedness.  Hematological: Negative for adenopathy. Does not bruise/bleed easily.  Psychiatric/Behavioral: Negative for behavioral problems, confusion, sleep disturbance, dysphoric mood, decreased concentration and agitation.       Objective:   Physical Exam  Constitutional: She is oriented to person, place, and time. She appears well-developed and well-nourished. No distress.  HENT:  Head: Normocephalic and atraumatic.    Mouth/Throat: Oropharynx is clear and moist. No oropharyngeal exudate.  Eyes: Conjunctivae and EOM are normal. No scleral icterus.  Neck: Normal range of  motion. Neck supple. No JVD present.  Cardiovascular: Normal rate, regular rhythm and normal heart sounds.  Exam reveals no gallop and no friction rub.   No murmur heard. Pulmonary/Chest: Effort normal and breath sounds normal. No respiratory distress. She has no wheezes. She has no rales. She exhibits no tenderness.  Abdominal: She exhibits no distension and no mass. There is no tenderness. There is no rebound and no guarding.    Musculoskeletal: She exhibits no edema and no tenderness.  Lymphadenopathy:    She has no cervical adenopathy.  Neurological: She is alert and oriented to person, place, and time. She has normal reflexes. She exhibits normal muscle tone. Coordination normal.  Skin: Skin is warm and dry. She is not diaphoretic. No erythema. No pallor.  Psychiatric: She has a normal mood and affect. Her behavior is normal. Judgment and thought content normal.          Assessment & Plan:  Recurrent UTI with RAOLTELLA ORNITHNOLYTICA :   This species has been reported in literature to be largely associated with highly immunocompromised patient such as patients with malignancy that her on immunosuppressive drugs. He is a member of the Enterobactericae family. I have never encountered this organism before and certainly am not familiar with it being present in patients with normal anion systems. It is a relatively newly identified species so it may be potentially under reported. Given the persistence of this organism on culture I worry about a nidus of infection in the patient's abdomen.   Therefore we will order a CT with contrast of the abdomen and pelvis to search for an occult abscess such as renal abscess or some other pathology that might explain her recurrent infections. Certainly she could have recurrent infection due to kidney  stones as well although it is unusual to see this particular organism repeatedly grown on culture.  We'll also followup the results culture results from Monday to see if she in fact is growing the same organism now.  I spent greater than 60 minutes with the patient including greater than 50% of time in face to face counsel of the patient and in coordination of their care.    Hepatitis: The patient had elevation of her transaminases on her recent blood work. Will check an acute hepatitis panel and as mentioned her imaging her abdomen with CT of the abdomen and pelvis.  STD  screening: Patient is not at high risk demographic for HIV but we will check her for HIV.   Ulcerative colitis: I wonder if this might be related to her recent problems with bladder infections especially if it is not as well-controlled as it has been in the past. Again imaging with a CT scan may be potentially informative.

## 2013-04-21 LAB — HEPATITIS PANEL, ACUTE
HCV Ab: NEGATIVE
Hep B C IgM: NEGATIVE
Hepatitis B Surface Ag: NEGATIVE

## 2013-04-21 LAB — EPSTEIN-BARR VIRUS VCA ANTIBODY PANEL
EBV NA IgG: 387 U/mL — ABNORMAL HIGH (ref <18.0)
EBV VCA IgG: 540 U/mL — ABNORMAL HIGH (ref <18.0)

## 2013-04-25 ENCOUNTER — Telehealth: Payer: Self-pay | Admitting: Internal Medicine

## 2013-04-25 ENCOUNTER — Other Ambulatory Visit: Payer: Self-pay | Admitting: *Deleted

## 2013-04-25 ENCOUNTER — Other Ambulatory Visit: Payer: Self-pay | Admitting: Licensed Clinical Social Worker

## 2013-04-25 DIAGNOSIS — B20 Human immunodeficiency virus [HIV] disease: Secondary | ICD-10-CM

## 2013-04-25 DIAGNOSIS — K759 Inflammatory liver disease, unspecified: Secondary | ICD-10-CM

## 2013-04-25 LAB — CREATININE, SERUM: Creat: 1.09 mg/dL (ref 0.50–1.10)

## 2013-04-25 NOTE — Telephone Encounter (Signed)
Message forwarded to Dr. Hilty/Jenna, RN. 

## 2013-04-25 NOTE — Addendum Note (Signed)
Addended by: Alesia Morin F on: 04/25/2013 12:03 PM   Modules accepted: Orders

## 2013-04-25 NOTE — Telephone Encounter (Signed)
Was diagnosed with pulmonary emboli in February. Dr Rennis Golden is treating her for this with blood thinner.Scheduled this Wednesday for CT of abdomen, She would like for Dr Rennis Golden to order CT of her chest to see how her blood clot is doing. Please call and let her know asap.Marland Kitchen

## 2013-04-26 ENCOUNTER — Telehealth: Payer: Self-pay | Admitting: Internal Medicine

## 2013-04-26 NOTE — Telephone Encounter (Signed)
Her d-dimer is still positive - this may have been due to infection or hepatitis.  It is reasonable to repeat the CT pulmonary angiogram (contrast) to see if her LLL PE has resolved - she has been on xarelto for 6 months.  The 2 CT scans are different studies and I do not think they can be done at the same time.  Please advise her to wait until her UTI has resolved.  Dr. Rennis Golden

## 2013-04-26 NOTE — Telephone Encounter (Signed)
Patient is still waiting to hear about the scheduling of her CT scan ( she wants to have 2 studies done the same day)  Please call [phone].

## 2013-04-26 NOTE — Telephone Encounter (Signed)
Called patient with recommendations per Dr. Rennis Golden to wait until current illness has resolved - and then order CT pulm angiogram to assess status of PE

## 2013-04-27 ENCOUNTER — Encounter (HOSPITAL_COMMUNITY): Payer: Self-pay

## 2013-04-27 ENCOUNTER — Ambulatory Visit (HOSPITAL_COMMUNITY)
Admission: RE | Admit: 2013-04-27 | Discharge: 2013-04-27 | Disposition: A | Discharge status: Home / Self Care | Payer: Medicare Other | Source: Ambulatory Visit | Attending: Infectious Disease | Admitting: Infectious Disease

## 2013-04-27 DIAGNOSIS — D7389 Other diseases of spleen: Secondary | ICD-10-CM | POA: Insufficient documentation

## 2013-04-27 DIAGNOSIS — N289 Disorder of kidney and ureter, unspecified: Secondary | ICD-10-CM | POA: Insufficient documentation

## 2013-04-27 DIAGNOSIS — M25559 Pain in unspecified hip: Secondary | ICD-10-CM | POA: Diagnosis not present

## 2013-04-27 DIAGNOSIS — Z87440 Personal history of urinary (tract) infections: Secondary | ICD-10-CM | POA: Diagnosis not present

## 2013-04-27 DIAGNOSIS — I700 Atherosclerosis of aorta: Secondary | ICD-10-CM | POA: Insufficient documentation

## 2013-04-27 DIAGNOSIS — R509 Fever, unspecified: Secondary | ICD-10-CM | POA: Diagnosis not present

## 2013-04-27 DIAGNOSIS — K5730 Diverticulosis of large intestine without perforation or abscess without bleeding: Secondary | ICD-10-CM | POA: Insufficient documentation

## 2013-04-27 DIAGNOSIS — K838 Other specified diseases of biliary tract: Secondary | ICD-10-CM | POA: Diagnosis not present

## 2013-04-27 DIAGNOSIS — K449 Diaphragmatic hernia without obstruction or gangrene: Secondary | ICD-10-CM | POA: Insufficient documentation

## 2013-04-27 DIAGNOSIS — K759 Inflammatory liver disease, unspecified: Secondary | ICD-10-CM

## 2013-04-27 DIAGNOSIS — N390 Urinary tract infection, site not specified: Secondary | ICD-10-CM

## 2013-04-27 MED ORDER — IOHEXOL 300 MG/ML  SOLN
100.0000 mL | Freq: Once | INTRAMUSCULAR | Status: AC | PRN
  Administered 2013-04-27: 100 mL via INTRAVENOUS

## 2013-04-29 DIAGNOSIS — N390 Urinary tract infection, site not specified: Secondary | ICD-10-CM | POA: Diagnosis not present

## 2013-05-17 DIAGNOSIS — Z1211 Encounter for screening for malignant neoplasm of colon: Secondary | ICD-10-CM | POA: Diagnosis not present

## 2013-05-17 DIAGNOSIS — K5730 Diverticulosis of large intestine without perforation or abscess without bleeding: Secondary | ICD-10-CM | POA: Diagnosis not present

## 2013-05-17 DIAGNOSIS — K5190 Ulcerative colitis, unspecified, without complications: Secondary | ICD-10-CM | POA: Diagnosis not present

## 2013-05-17 DIAGNOSIS — K5100 Ulcerative (chronic) pancolitis without complications: Secondary | ICD-10-CM | POA: Diagnosis not present

## 2013-05-17 DIAGNOSIS — K649 Unspecified hemorrhoids: Secondary | ICD-10-CM | POA: Diagnosis not present

## 2013-05-17 DIAGNOSIS — K5289 Other specified noninfective gastroenteritis and colitis: Secondary | ICD-10-CM | POA: Diagnosis not present

## 2013-05-20 ENCOUNTER — Encounter: Payer: Self-pay | Admitting: Internal Medicine

## 2013-05-21 IMAGING — CR DG CHEST 2V
2 series · 2 of 2 positions shown · non-contrast
Comparison: 07/30/2006

CLINICAL DATA: Shortness of breath

CHEST - 2 VIEW

[w chest pa]
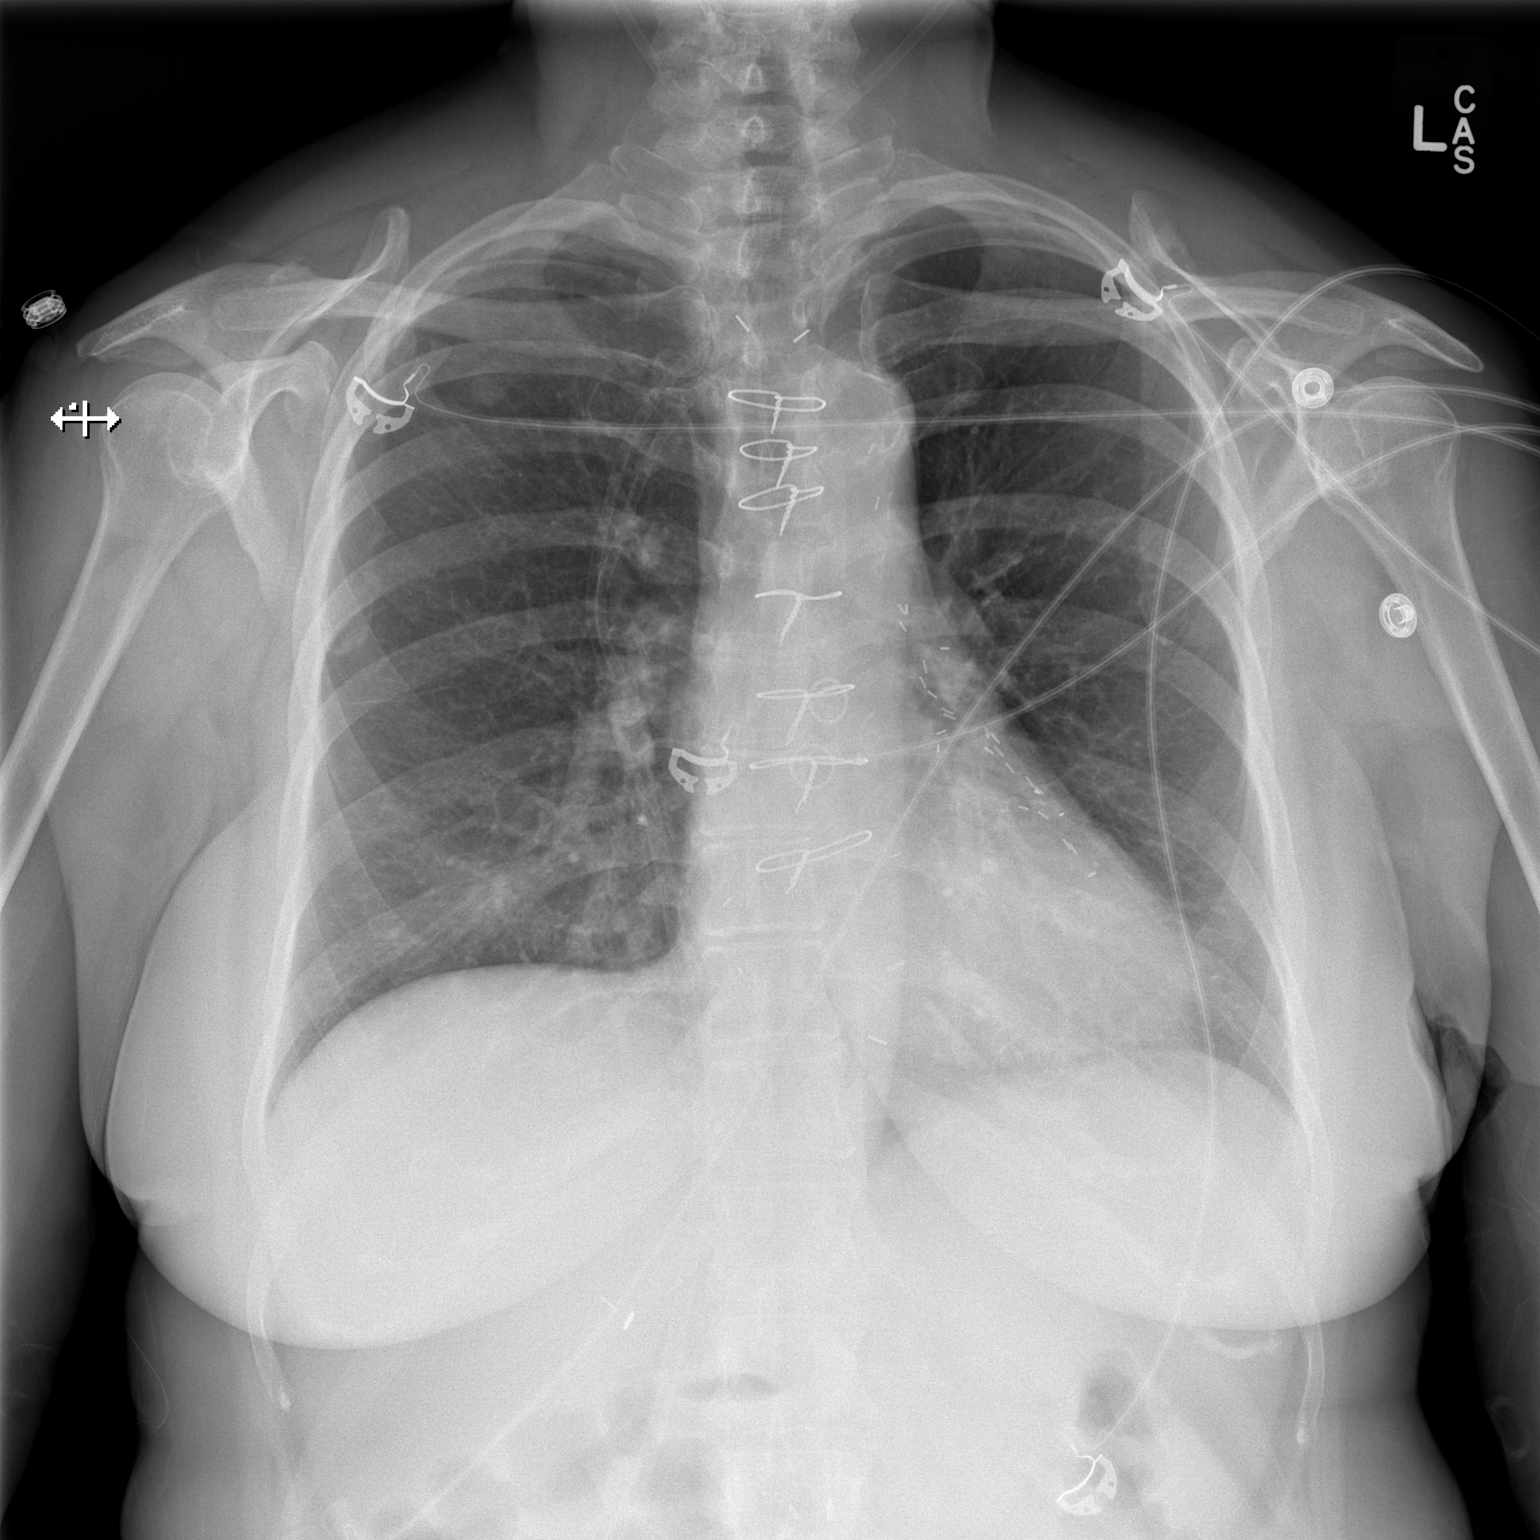

[w chest lat]
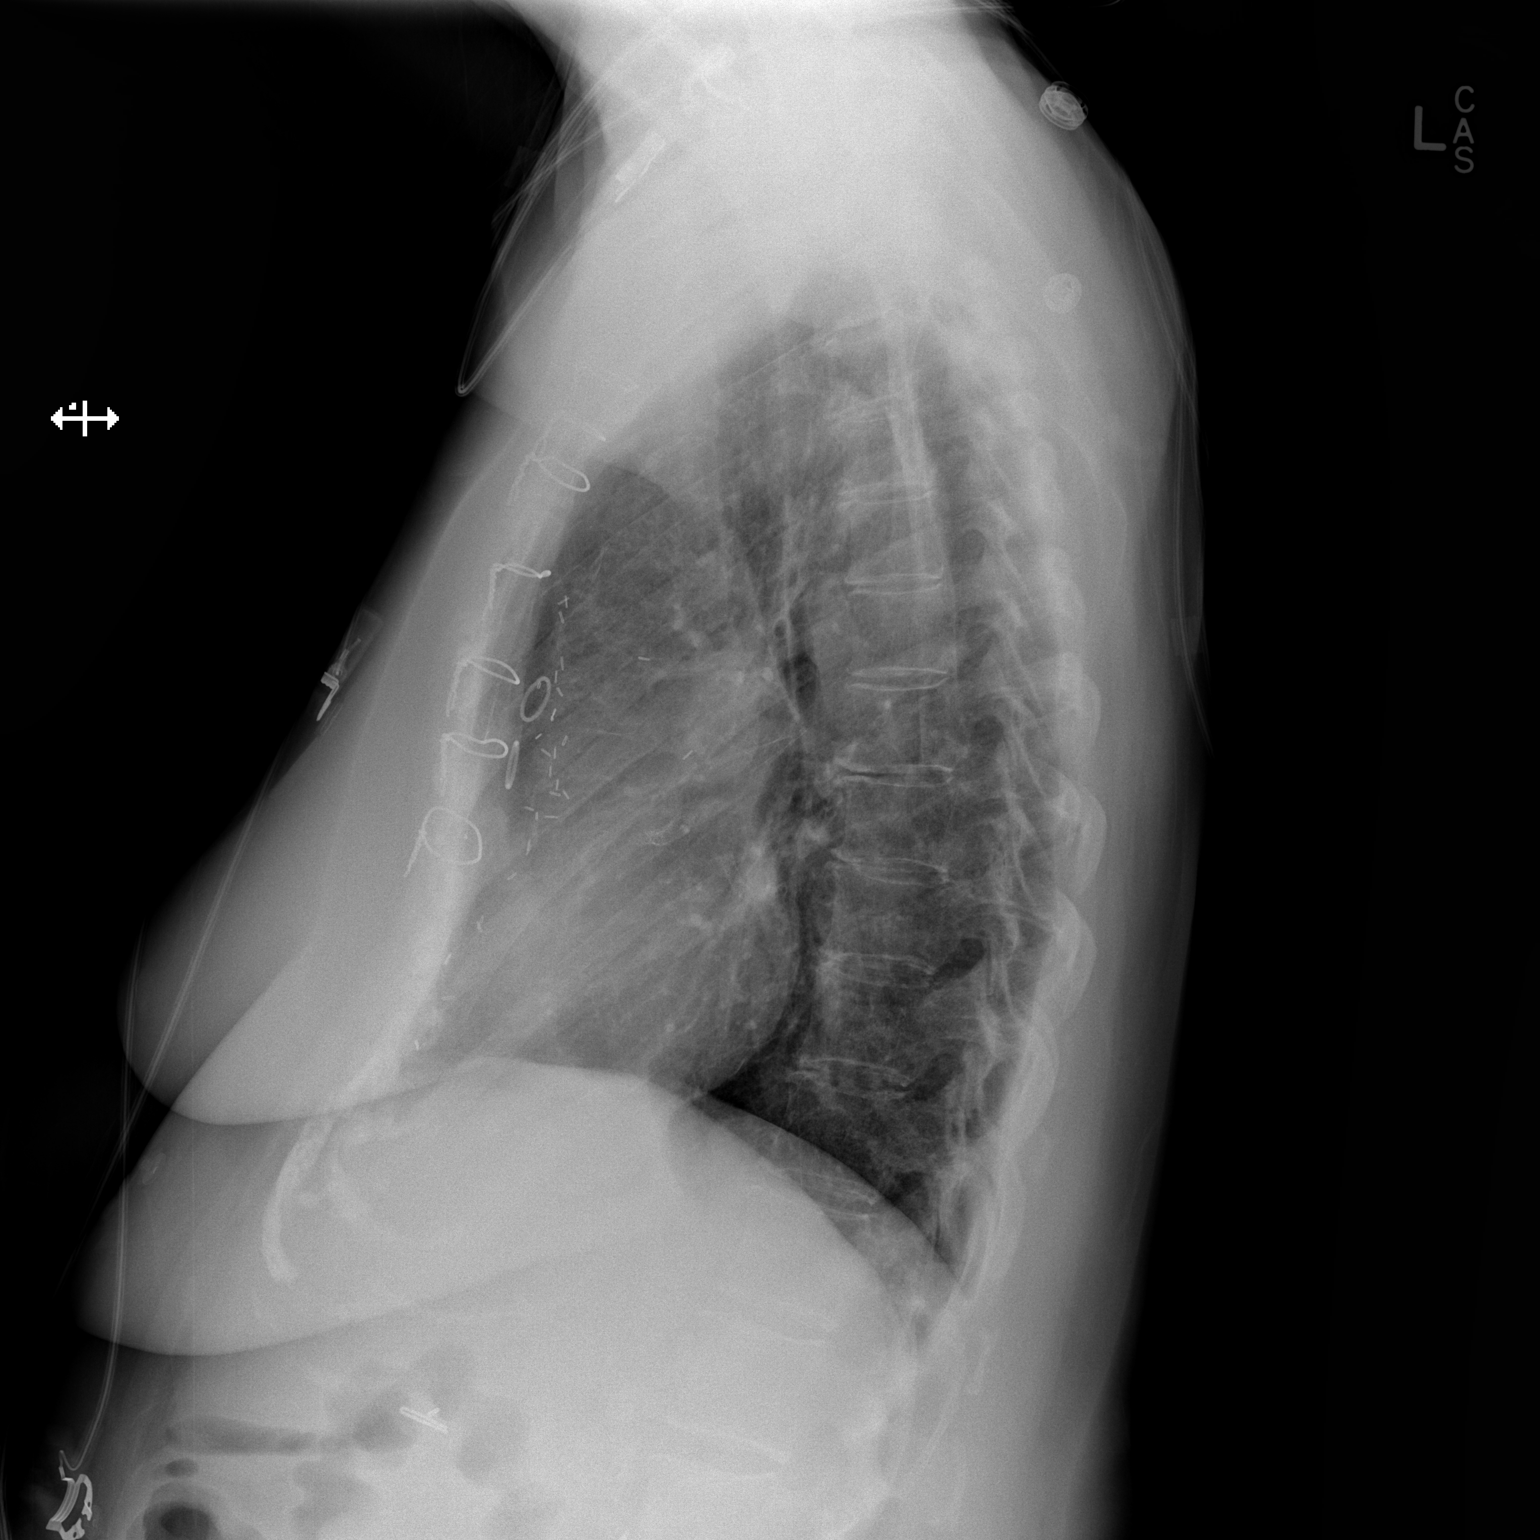

[2 of 2 positions shown; findings below may reference images not displayed]

FINDINGS: Prior median sternotomy and CABG procedure.

The heart size is normal.

9 mm nodule identified within the right upper lobe is more
conspicuous than on the previous exam.  Indeterminate.

No airspace consolidation identified.

Visualized osseous structures are intact.
IMPRESSION: 1.  No acute findings identified.
2.  Indeterminant nodule in the right upper lobe measures
approximately 9 mm.  Suggest follow-up noncontrast CT of the chest
for more specific assessment of this structure.

## 2013-05-24 DIAGNOSIS — N3020 Other chronic cystitis without hematuria: Secondary | ICD-10-CM | POA: Diagnosis not present

## 2013-05-25 ENCOUNTER — Ambulatory Visit (INDEPENDENT_AMBULATORY_CARE_PROVIDER_SITE_OTHER): Payer: Medicare Other | Admitting: Infectious Disease

## 2013-05-25 ENCOUNTER — Encounter: Payer: Self-pay | Admitting: Infectious Disease

## 2013-05-25 VITALS — BP 118/70 | HR 90 | Temp 97.7°F | Ht 63.5 in | Wt 181.0 lb

## 2013-05-25 DIAGNOSIS — K5190 Ulcerative colitis, unspecified, without complications: Secondary | ICD-10-CM

## 2013-05-25 DIAGNOSIS — K5730 Diverticulosis of large intestine without perforation or abscess without bleeding: Secondary | ICD-10-CM | POA: Diagnosis not present

## 2013-05-25 DIAGNOSIS — B9689 Other specified bacterial agents as the cause of diseases classified elsewhere: Secondary | ICD-10-CM

## 2013-05-25 DIAGNOSIS — Z113 Encounter for screening for infections with a predominantly sexual mode of transmission: Secondary | ICD-10-CM | POA: Diagnosis not present

## 2013-05-25 DIAGNOSIS — N390 Urinary tract infection, site not specified: Secondary | ICD-10-CM

## 2013-05-25 DIAGNOSIS — K759 Inflammatory liver disease, unspecified: Secondary | ICD-10-CM

## 2013-05-25 DIAGNOSIS — A499 Bacterial infection, unspecified: Secondary | ICD-10-CM

## 2013-05-25 NOTE — Progress Notes (Signed)
Subjective:    Patient ID: Kara Bush, female    DOB: [birth year], 72 y.o.   MRN: [id number]  HPI  Kara Bush is a 72 year old Caucasian lady with a past medical history significant for ulcerative colitis, abdominal aortic aneurysm status post resection with graft placement, coronary artery disease status post coronary artery bypass grafting, recently diagnosed pulmonary embolism 2 approximately 2 weeks ago Had developed symptoms of fevers chills nausea urinary urgency suprapubic discomfort and frequent urination. She was evaluated but treated by [facility] physicians in late August and urinalysis showed pyuria and urine culture grew 100,000 colony-forming units of RAOLTELLA ORNITHNOLYTICA which was resistant to amoxicillin Augmentin cefazolin cephalothin Bactrim but otherwise sensitive to cefepime ceftriaxone cefuroxime ciprofloxacin imipenem Zosyn and tetracycline tobramycin and Bactrim. She was given a course of Bactrim but persisted in having fevers. Bactrim was discontinued and then approximately 3 days after discontinuation of the Bactrim she again developed urinary discomfort and frequency. She was seen by Dr. Luciana Axe at the [facility] practice and urinalysis and culture were obtained. She again grew the same organism at 90,000 colony-forming units this time. Sensitivities were identical. Patient was sent home on ciprofloxacin for a seven-day course.   Shortly after stopping this course of antibiotics on last Thursday she then again developed symptoms on Sunday of frequent urination and suprapubic discomfort. She was seen by Dr. Luciana Axe apparently urinalysis again showed pyuria and cultures had been growing a gram-negative rod in urine although the species was not yet identified.  We are consulted to assist the workup and management of recurrence of this unusual pathogen from urine.   We performed CT of the abdomen and pelvis and this did not show any clear-cut evidence of abscess or nidus for  infection. Patient has also been evaluated by [facility] and Dr. Patsi Sears.   Since I last saw her she states that she did another "round of antibiotics in the form of Macrobid with resolution of all of her symptoms. She states that yesterday when she saw Dr. Patsi Sears a urinalysis was performed and this showed no evidence of pyuria.  Review of Systems  Constitutional: Negative for fever, diaphoresis, activity change, appetite change, fatigue and unexpected weight change.  HENT: Negative for congestion, rhinorrhea, sinus pressure, sneezing, sore throat and trouble swallowing.   Eyes: Negative for photophobia and visual disturbance.  Respiratory: Negative for cough, chest tightness, shortness of breath, wheezing and stridor.   Cardiovascular: Negative for chest pain, palpitations and leg swelling.  Gastrointestinal: Negative for abdominal pain, diarrhea, constipation, blood in stool, abdominal distention and anal bleeding.  Genitourinary: Negative for dysuria and difficulty urinating.  Musculoskeletal: Negative for arthralgias, back pain, gait problem, joint swelling and myalgias.  Skin: Negative for color change, pallor, rash and wound.  Neurological: Negative for dizziness, tremors, weakness and light-headedness.  Hematological: Negative for adenopathy. Does not bruise/bleed easily.  Psychiatric/Behavioral: Negative for behavioral problems, confusion, sleep disturbance, dysphoric mood, decreased concentration and agitation.       Objective:   Physical Exam  Constitutional: She is oriented to person, place, and time. She appears well-developed and well-nourished. No distress.  HENT:  Head: Normocephalic and atraumatic.  Mouth/Throat: Oropharynx is clear and moist. No oropharyngeal exudate.  Eyes: Conjunctivae and EOM are normal. No scleral icterus.  Neck: Normal range of motion. Neck supple. No JVD present.  Cardiovascular: Normal rate, regular rhythm and normal heart sounds.   Exam reveals no gallop and no friction rub.   No murmur heard. Pulmonary/Chest: Effort normal and breath  sounds normal. No respiratory distress. She has no wheezes. She has no rales. She exhibits no tenderness.  Abdominal: She exhibits no distension and no mass. There is no tenderness. There is no rebound and no guarding.  Musculoskeletal: She exhibits no edema and no tenderness.  Lymphadenopathy:    She has no cervical adenopathy.  Neurological: She is alert and oriented to person, place, and time. She has normal reflexes. She exhibits normal muscle tone. Coordination normal.  Skin: Skin is warm and dry. She is not diaphoretic. No erythema. No pallor.  Psychiatric: She has a normal mood and affect. Her behavior is normal. Judgment and thought content normal.          Assessment & Plan:  Recurrent UTI with RAOLTELLA ORNITHNOLYTICA :   This species has been reported in literature to be largely associated with highly immunocompromised patient such as patients with malignancy that her on immunosuppressive drugs.It is a member of the Enterobactericae family. I had never encountered this organism before and certainly am not familiar with it being present in patients with normal immune systems. It is a relatively newly identified species so it may be potentially under reported. Given the persistence of this organism on culture I worried about a nidus of infection in the patient's abdomen but CT of the abdomen and pelvis failed to show a clear cut nidus. She does have diverticuli apparently seen on colonoscopy by Dr. Elnoria Howard. She has done well post course of macrobid.  For present I would recommend watching her off antibiotics. Besides this recent episode she has rarerly succumbed to UTI on the order of once every 2 years  to see this particular organism repeatedly grown on culture.    Hepatitis: The patient had elevation of her transaminases on her recent blood work. Her acute hepatitis panel was  negative and her EBV panel c/w past infection  STD  screening: Patient is not at high risk demographic for HIV but we checked HIV and this was negative   Ulcerative colitis: is apparently quiescent at present

## 2013-06-03 ENCOUNTER — Encounter: Payer: Self-pay | Admitting: Physical Medicine and Rehabilitation

## 2013-06-03 ENCOUNTER — Encounter
Payer: Medicare Other | Attending: Physical Medicine and Rehabilitation | Admitting: Physical Medicine and Rehabilitation

## 2013-06-03 VITALS — BP 161/62 | HR 78 | Resp 14 | Ht 64.0 in | Wt 181.8 lb

## 2013-06-03 DIAGNOSIS — M545 Low back pain, unspecified: Secondary | ICD-10-CM | POA: Insufficient documentation

## 2013-06-03 DIAGNOSIS — I739 Peripheral vascular disease, unspecified: Secondary | ICD-10-CM | POA: Insufficient documentation

## 2013-06-03 DIAGNOSIS — G8929 Other chronic pain: Secondary | ICD-10-CM | POA: Insufficient documentation

## 2013-06-03 DIAGNOSIS — IMO0002 Reserved for concepts with insufficient information to code with codable children: Secondary | ICD-10-CM | POA: Diagnosis not present

## 2013-06-03 DIAGNOSIS — M2420 Disorder of ligament, unspecified site: Secondary | ICD-10-CM | POA: Diagnosis not present

## 2013-06-03 DIAGNOSIS — M629 Disorder of muscle, unspecified: Secondary | ICD-10-CM | POA: Diagnosis not present

## 2013-06-03 DIAGNOSIS — IMO0001 Reserved for inherently not codable concepts without codable children: Secondary | ICD-10-CM | POA: Insufficient documentation

## 2013-06-03 DIAGNOSIS — M542 Cervicalgia: Secondary | ICD-10-CM | POA: Diagnosis not present

## 2013-06-03 DIAGNOSIS — I2510 Atherosclerotic heart disease of native coronary artery without angina pectoris: Secondary | ICD-10-CM | POA: Diagnosis not present

## 2013-06-03 DIAGNOSIS — G4733 Obstructive sleep apnea (adult) (pediatric): Secondary | ICD-10-CM | POA: Diagnosis not present

## 2013-06-03 DIAGNOSIS — I10 Essential (primary) hypertension: Secondary | ICD-10-CM | POA: Insufficient documentation

## 2013-06-03 DIAGNOSIS — M47812 Spondylosis without myelopathy or radiculopathy, cervical region: Secondary | ICD-10-CM | POA: Diagnosis not present

## 2013-06-03 DIAGNOSIS — M76899 Other specified enthesopathies of unspecified lower limb, excluding foot: Secondary | ICD-10-CM | POA: Insufficient documentation

## 2013-06-03 DIAGNOSIS — R209 Unspecified disturbances of skin sensation: Secondary | ICD-10-CM | POA: Insufficient documentation

## 2013-06-03 DIAGNOSIS — M25519 Pain in unspecified shoulder: Secondary | ICD-10-CM | POA: Diagnosis not present

## 2013-06-03 MED ORDER — METHOCARBAMOL 500 MG PO TABS: 500.0000 mg | ORAL_TABLET | Freq: Three times a day (TID) | ORAL | Status: DC

## 2013-06-03 MED ORDER — HYDROCODONE-ACETAMINOPHEN 5-325 MG PO TABS: ORAL_TABLET | ORAL | Status: DC

## 2013-06-03 NOTE — Patient Instructions (Signed)
Stay as active as tolerated. 

## 2013-06-03 NOTE — Progress Notes (Signed)
Subjective:    Patient ID: Kara Bush, female    DOB: 03-Oct-1940, 72 y.o.   MRN: [id number]  HPI The patient complains about chronic low back pain, neck pain and right shoulder blade pain . The patient also complains about intermittend, positional numbness and tingling in the C6 distribution.  The problem has been stable. The patient is doing water aerobics 3 times per week, and is paying attention to a good posture.  The pain around her right SI joint, has resolved. She uses the Flector patches when she has a flare up and they have helped her.   Pain Inventory Average Pain 3 Pain Right Now 4 My pain is aching  In the last 24 hours, has pain interfered with the following? General activity 3 Relation with others 0 Enjoyment of life 2 What TIME of day is your pain at its worst? morning Sleep (in general) Fair  Pain is worse with: bending and some activites Pain improves with: rest and medication Relief from Meds: 5  Mobility walk without assistance how many minutes can you walk? 10 ability to climb steps?  yes do you drive?  yes  Function retired  Neuro/Psych depression anxiety  Prior Studies Any changes since last visit?  no  Physicians involved in your care Any changes since last visit?  no   Family History  Problem Relation Age of Onset  . Heart disease Father   . Heart attack Father     @ 11  . Cancer Paternal Aunt     BREAST  . Heart failure Mother     at 2  . Healthy Sister   . Healthy Brother   . Heart failure Maternal Grandmother   . Heart attack Maternal Grandfather   . Heart attack Paternal Grandmother     at 12   History   Social History  . Marital Status: Divorced    Spouse Name: N/A    Number of Children: 2  . Years of Education: college    Occupational History  .     Social History Main Topics  . Smoking status: Never Smoker   . Smokeless tobacco: Never Used  . Alcohol Use: No  . Drug Use: No  . Sexual Activity: None    Other Topics Concern  . None   Social History Narrative  . None   Past Surgical History  Procedure Laterality Date  . Hernia repair    . Tubal ligation      BTL  . Coronary artery bypass graft  1998    LIMA-LAD;VG-DIAG & RCA  . Coronary stent placement  2005    TO LAD & LCX-STENTS,  . Aortobiexternal iliac bypass  1991  . Coronary angioplasty with stent placement  2006    to LAD, ATRETIC LIMA AT THAT TIME ALSO  . Coronary angioplasty with stent placement  2007    STENT TO PROX VG-DIAG, OTHER STENTS PATENT  . Cholecystectomy  1997   Past Medical History  Diagnosis Date  . Hypertension   . CAD (coronary artery disease)     hx CABG 1998, last cath 2007 with PCI and stenting to the proximal segment of the vein graft to the diagonal branch. DES Taxus was placed  . Depression   . Ulcerative colitis   . Hyperlipidemia   . Chronic fatigue fibromyalgia syndrome   . OSA (obstructive sleep apnea)   . PAD (peripheral artery disease)   . S/P resection of aortic aneurysm   . Chest  wall pain   . Cervicalgia   . Pes anserine bursitis   . Fibromyalgia   . Chronic pain syndrome   . Trochanteric bursitis   . Pulmonary embolism 01/11/2013  . Anemia, improved 01/11/2013  . History of nuclear stress test 08/07/2011    lexiscan; no evidence of ischemia or infarct; low risk   . Abdominal aortic aneurysm   . Ulcerative colitis   . Obstructive sleep apnea   . Fibromyalgia   . Coronary artery disease   . Hypertension   . Basal cell carcinoma    BP 161/62  Pulse 78  Resp 14  Ht 5\' 4"  (1.626 m)  Wt 181 lb 12.8 oz (82.464 kg)  BMI 31.19 kg/m2  SpO2 97%     Review of Systems  Musculoskeletal: Positive for back pain.  Psychiatric/Behavioral: Positive for dysphoric mood. The patient is nervous/anxious.        Objective:   Physical Exam Constitutional: She is oriented to person, place, and time. She appears well-developed and well-nourished.  HENT:  Head: Normocephalic.  Neck:  Neck supple.  Musculoskeletal: She exhibits tenderness.  Neurological: She is alert and oriented to person, place, and time.  Skin: Skin is warm and dry.  Psychiatric: She has a normal mood and affect.  Symmetric normal motor tone is noted throughout, except increased muscle tone in trapezius bilateral. Normal muscle bulk. Muscle testing reveals 5/5 muscle strength of the upper extremity, except wrist extension on the left, C6, and 5/5 of the lower extremity. Full range of motion in upper and lower extremities. ROM of spine is restricted. Fine motor movements are normal in both hands.  Sensory is intact and symmetric to light touch, pinprick and proprioception.  DTR in the upper and lower extremity are present and symmetric 3+. No clonus is noted.  Patient arises from chair without difficulty. Narrow based gait with normal arm swing bilateral , able to walk on heels and toes . Tandem walk is stable. No pronator drift. Rhomberg negative.  Good finger to nose and heel to shin testing. No tremor, dystaxia or dysmetria noted.         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 Recent radiographs  2013 show degenerative changes at C5-6 as well as at C6-7 without evidence of pathological motion with flexion extension views, they also show short pedicles.  2.Bilateral pes anserine bursitis.  3.Mild trochanteric bursitis/iliotibial band syndrome.  4.History of fibromyalgia.  5.Brisk reflexes,most likely cervical spinal stenosis, short pedicles.  6. Bilateral intermittent hand numbness: Carpal tunnel syndrome versus C6 radiculopathy mild  7. LBP, with SI inflammation/irritation on the right.   Advised patient to continue with water exercises 3x per week, also advised patient to keep a good posture showed her how to correct her posture.Also showed her some stretching exercises for her SI joint. Flector patches, I prescribed for  inflammation/irritation of SI joint, and lumbar muscle strain, have given her good relief. Prescribed Robaxin for her muscle pain, at last visit, which she has tolerated well in the past, she uses it sparingly.  Refilled Hydrocodone 5/325, # 60, gave her prescription for another month to be filled when due  Follow up in 1 month.

## 2013-06-13 DIAGNOSIS — N362 Urethral caruncle: Secondary | ICD-10-CM | POA: Diagnosis not present

## 2013-06-13 DIAGNOSIS — N3020 Other chronic cystitis without hematuria: Secondary | ICD-10-CM | POA: Diagnosis not present

## 2013-06-13 DIAGNOSIS — N952 Postmenopausal atrophic vaginitis: Secondary | ICD-10-CM | POA: Diagnosis not present

## 2013-06-21 ENCOUNTER — Ambulatory Visit: Payer: Medicare Other | Admitting: Physical Medicine and Rehabilitation

## 2013-06-21 DIAGNOSIS — G4700 Insomnia, unspecified: Secondary | ICD-10-CM | POA: Diagnosis not present

## 2013-06-21 DIAGNOSIS — R5383 Other fatigue: Secondary | ICD-10-CM | POA: Diagnosis not present

## 2013-06-21 DIAGNOSIS — R5381 Other malaise: Secondary | ICD-10-CM | POA: Diagnosis not present

## 2013-06-21 DIAGNOSIS — IMO0001 Reserved for inherently not codable concepts without codable children: Secondary | ICD-10-CM | POA: Diagnosis not present

## 2013-06-21 DIAGNOSIS — M76899 Other specified enthesopathies of unspecified lower limb, excluding foot: Secondary | ICD-10-CM | POA: Diagnosis not present

## 2013-06-22 ENCOUNTER — Telehealth: Payer: Self-pay | Admitting: Internal Medicine

## 2013-06-22 DIAGNOSIS — Z79899 Other long term (current) drug therapy: Secondary | ICD-10-CM | POA: Diagnosis not present

## 2013-06-22 DIAGNOSIS — R0602 Shortness of breath: Secondary | ICD-10-CM | POA: Diagnosis not present

## 2013-06-22 NOTE — Telephone Encounter (Signed)
She can have d-dimer and CBC prior to follow-up with me.  -Dr. Rennis Golden

## 2013-06-22 NOTE — Telephone Encounter (Signed)
Returning your call. °

## 2013-06-22 NOTE — Telephone Encounter (Signed)
She has an appt next Wednesday. She wants to know if she need lab work before her appt?

## 2013-06-22 NOTE — Telephone Encounter (Signed)
Returned call.  Left message to call back before 4pm.  

## 2013-06-22 NOTE — Telephone Encounter (Signed)
Returned call and pt verified x 2.  Pt informed message received and Dr. Rennis Golden does want labs prior to appt.  Informed she does not need lab order as they are in the computer and she can just show up at the lab to have drawn.  Pt verbalized understanding and agreed w/ plan.

## 2013-06-22 NOTE — Telephone Encounter (Signed)
Message forwarded to Dr. Rennis Golden for further instructions.

## 2013-06-24 LAB — D-DIMER, QUANTITATIVE: D-Dimer, Quant: 1.03 ug/mL-FEU — ABNORMAL HIGH (ref 0.00–0.48)

## 2013-06-24 LAB — CBC
Hemoglobin: 12.3 g/dL (ref 12.0–15.0)
MCH: 24.6 pg — ABNORMAL LOW (ref 26.0–34.0)
MCV: 75.6 fL — ABNORMAL LOW (ref 78.0–100.0)
Platelets: 278 10*3/uL (ref 150–400)
RBC: 4.99 MIL/uL (ref 3.87–5.11)
RDW: 14.9 % (ref 11.5–15.5)
WBC: 9.7 10*3/uL (ref 4.0–10.5)

## 2013-06-29 ENCOUNTER — Encounter: Payer: Self-pay | Admitting: Internal Medicine

## 2013-06-29 ENCOUNTER — Ambulatory Visit (INDEPENDENT_AMBULATORY_CARE_PROVIDER_SITE_OTHER): Payer: Medicare Other | Admitting: Internal Medicine

## 2013-06-29 VITALS — BP 142/62 | HR 82 | Ht 64.0 in | Wt 182.6 lb

## 2013-06-29 DIAGNOSIS — I2699 Other pulmonary embolism without acute cor pulmonale: Secondary | ICD-10-CM | POA: Diagnosis not present

## 2013-06-29 DIAGNOSIS — I712 Thoracic aortic aneurysm, without rupture, unspecified: Secondary | ICD-10-CM | POA: Diagnosis not present

## 2013-06-29 DIAGNOSIS — I2510 Atherosclerotic heart disease of native coronary artery without angina pectoris: Secondary | ICD-10-CM

## 2013-06-29 NOTE — Patient Instructions (Signed)
Dr. Rennis Golden has ordered a CT of the chest to assess your pulmonary embolus. This will be done at [facility].   Your physician wants you to follow-up in: 6 months. You will receive a reminder letter in the mail two months in advance. If you don't receive a letter, please call our office to schedule the follow-up appointment.

## 2013-06-29 NOTE — Progress Notes (Signed)
06/29/2013   PCP: Gaye Alken, MD   Chief Complaint  Patient presents with  . 3 months    c/o DOE; stopped xarelto in sept; D-Dimer    Primary Cardiologist:Dr. Ellene Route   HPI: 72 year old white female with a history of coronary artery disease as well as peripheral vascular disease. She had bypass grafting in 1998 and stents in 2005 2006 and 2000 710 negative nuclear stress test in 2013 she also has peripheral arterial disease and history of aortic iliac bypass in 1991. Last ABIs were made of 2013 were normal.  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  Fortunately she was asymptomatic, she was placed on anticoagulation with the Xarelto.   She has done well since that time.  Her last visit with Dr. Rennis Golden was in February and he was planning to recheck d-dimer and if it was still elevated she would need to continue her Xarelto for total of 6 months.  Her d-dimer continues to be elevated at 0.67. Initial d-dimer in February was 1.48.  Venous Dopplers were also done in February which were negative for DVT.    Mrs. Scarlata just had a repeat D. dimer which was elevated at 1.48, actually slightly higher than it had been before. She tells a story that over the past several weeks she's had a urinary tract infection and started taking Bactrim for which she is more nauseated and is actually thrown up. She reports some tenderness across her epigastric area as well as the costovertebral angles. She's also had fever, chills and some sweating at night. She is planning on seeing her primary care doctor back in the office this afternoon. She denies any worsening shortness of breath or chest pain. He's had no bleeding complications on Xarelto.  She underwent successful treatment for her urinary tract infection. A repeat d-dimer was just performed and was persistently elevated at  1.03.  She reports she stopped taking Xarelto at least 2 months ago. She denies any chest pain or worsening shortness of breath.,    Allergies  Allergen Reactions  . Atenolol     cholitis  . Bactrim [Sulfamethoxazole-Trimethoprim] Nausea And Vomiting  . Cymbalta [Duloxetine Hcl]   . Lyrica [Pregabalin]   . Penicillins Rash    Current Outpatient Prescriptions  Medication Sig Dispense Refill  . aspirin 81 MG tablet Take 81 mg by mouth daily.       Marland Kitchen b complex vitamins capsule Take 1 capsule by mouth daily.      . citalopram (CELEXA) 20 MG tablet Take 20 mg by mouth daily.        . Coenzyme Q10 (CO Q-10) 200 MG CAPS Take 1 capsule by mouth daily.      . diclofenac (FLECTOR) 1.3 % PTCH Place 1 patch onto the skin 2 (two) times daily as needed.      . diphenhydramine-acetaminophen (TYLENOL PM EXTRA STRENGTH) 25-500 MG TABS Take 0.5 tablets by mouth at bedtime as needed.       . fish oil-omega-3 fatty acids 1000 MG capsule Take 1.2 g by mouth 2 (two) times daily.       Marland Kitchen HYDROcodone-acetaminophen (NORCO/VICODIN) 5-325 MG per tablet TAKE 1 TABLET BY MOUTH TWICE A DAY MUST LAST 30 DAYS  60 tablet  0  . lisinopril (PRINIVIL,ZESTRIL) 10 MG tablet Take 5 mg by mouth daily.       Marland Kitchen  Loteprednol Etabonate (LOTEMAX OP) Apply 1 drop to eye as needed.      . methocarbamol (ROBAXIN) 500 MG tablet Take 1 tablet (500 mg total) by mouth 3 (three) times daily.  60 tablet  2  . metoprolol succinate (TOPROL-XL) 25 MG 24 hr tablet 12.5 mg daily.       . Multiple Vitamin (MULITIVITAMIN WITH MINERALS) TABS Take 1 tablet by mouth daily.        . naproxen sodium (ANAPROX) 220 MG tablet Take 220 mg by mouth 2 (two) times daily with a meal. For pain.      . simvastatin (ZOCOR) 40 MG tablet TAKE 1 TABLET BY MOUTH EVERY DAY  90 tablet  2  . ZETIA 10 MG tablet TAKE 1 TABLET BY MOUTH EVERY DAY  90 tablet  2  . zolpidem (AMBIEN) 10 MG tablet Take 5 mg by mouth at bedtime as needed. For sleep.       No current  facility-administered medications for this visit.    Past Medical History  Diagnosis Date  . Hypertension   . CAD (coronary artery disease)     hx CABG 1998, last cath 2007 with PCI and stenting to the proximal segment of the vein graft to the diagonal branch. DES Taxus was placed  . Depression   . Ulcerative colitis   . Hyperlipidemia   . Chronic fatigue fibromyalgia syndrome   . OSA (obstructive sleep apnea)   . PAD (peripheral artery disease)   . S/P resection of aortic aneurysm   . Chest wall pain   . Cervicalgia   . Pes anserine bursitis   . Fibromyalgia   . Chronic pain syndrome   . Trochanteric bursitis   . Pulmonary embolism 01/11/2013  . Anemia, improved 01/11/2013  . History of nuclear stress test 08/07/2011    lexiscan; no evidence of ischemia or infarct; low risk   . Abdominal aortic aneurysm   . Ulcerative colitis   . Obstructive sleep apnea   . Fibromyalgia   . Coronary artery disease   . Hypertension   . Basal cell carcinoma     Past Surgical History  Procedure Laterality Date  . Hernia repair    . Tubal ligation      BTL  . Coronary artery bypass graft  1998    LIMA-LAD;VG-DIAG & RCA  . Coronary stent placement  2005    TO LAD & LCX-STENTS,  . Aortobiexternal iliac bypass  1991  . Coronary angioplasty with stent placement  2006    to LAD, ATRETIC LIMA AT THAT TIME ALSO  . Coronary angioplasty with stent placement  2007    STENT TO PROX VG-DIAG, OTHER STENTS PATENT  . Cholecystectomy  1997    ZOX:WRUEAVW:UJ colds or fevers, no weight changes Skin:no rashes or ulcers HEENT:no blurred vision, no congestion CV:see HPI PUL:see HPI GI:no diarrhea constipation or melena, no indigestion GU:no hematuria, no dysuria MS:no joint pain, no claudication Neuro:no syncope, no lightheadedness Endo:no diabetes, no thyroid disease  PHYSICAL EXAM BP 142/62  Pulse 82  Ht 5\' 4"  (1.626 m)  Wt 182 lb 9.6 oz (82.827 kg)  BMI 31.33 kg/m2 General:Pleasant affect,  NAD Skin:Warm and dry, brisk capillary refill, pale HEENT:normocephalic, sclera clear, mucus membranes moist Neck:supple, no JVD, no bruits  Heart:S1S2 RRR without murmur, gallup, rub or click Lungs:clear without rales, rhonchi, or wheezes WJX:BJYN, non tender, + BS, do not palpate liver spleen or masses, +CVA tenderness bilaterally Ext:no lower ext edema, 2+ pedal pulses,  2+ radial pulses Neuro:alert and oriented, MAE, follows commands, + facial symmetry   ASSESSMENT AND PLAN Patient Active Problem List   Diagnosis Date Noted  . Hepatitis 04/20/2013  . Screening for STD (sexually transmitted disease) 04/20/2013  . Recurrent UTI 04/20/2013  . Abdominal aortic aneurysm   . Ulcerative colitis   . Obstructive sleep apnea   . Fibromyalgia   . Coronary artery disease   . Basal cell carcinoma   . Pulmonary embolism, 09/21/12, anticoagulation on Xarelto 01/11/2013  . Anemia, improved 01/11/2013  . Ascending aortic aneurysm, 4.1 cm 01/11/2013  . 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 Chronic periscapular pain 10/15/2011  . Chest wall pain   . S/P resection of aortic aneurysm, 1998 prior to CABG   . PAD (peripheral artery disease), aortic-iliac bypass 1991, mild carotid bruits   . OSA (obstructive sleep apnea)   . Chronic fatigue fibromyalgia syndrome   . Hyperlipidemia   . Ulcerative colitis   . Depression   . CAD (coronary artery disease), CABG 1998, STENTS MULTIPLE SITES SINCE.    Marland Kitchen Hypertension    PLAN: 1. Mrs. Tout has a persistently elevated d-dimer, which is slightly lower than it had been when she had acute infection. Nevertheless it is difficult to know if she has completely resolved her left lower segmental pulmonary embolus. I would like to perform a dedicated CT pulmonary angiogram to evaluate for residual filling defect. This has totally resolved, we can continue to keep her off of the Xarelto that she self discontinued and treat her with aspirin alone. We will contact her  with the results of her pulmonary angiogram and plan to see her back in 6 months.  Chrystie Nose, MD, [facility] Attending Cardiologist [facility]

## 2013-07-01 ENCOUNTER — Ambulatory Visit (HOSPITAL_COMMUNITY)
Admission: RE | Admit: 2013-07-01 | Discharge: 2013-07-01 | Disposition: A | Discharge status: Home / Self Care | Payer: Medicare Other | Source: Ambulatory Visit | Attending: Internal Medicine | Admitting: Internal Medicine

## 2013-07-01 DIAGNOSIS — I2510 Atherosclerotic heart disease of native coronary artery without angina pectoris: Secondary | ICD-10-CM | POA: Insufficient documentation

## 2013-07-01 DIAGNOSIS — Z951 Presence of aortocoronary bypass graft: Secondary | ICD-10-CM | POA: Insufficient documentation

## 2013-07-01 DIAGNOSIS — I712 Thoracic aortic aneurysm, without rupture, unspecified: Secondary | ICD-10-CM | POA: Diagnosis not present

## 2013-07-01 DIAGNOSIS — E041 Nontoxic single thyroid nodule: Secondary | ICD-10-CM | POA: Diagnosis not present

## 2013-07-01 DIAGNOSIS — Z0389 Encounter for observation for other suspected diseases and conditions ruled out: Secondary | ICD-10-CM | POA: Insufficient documentation

## 2013-07-01 DIAGNOSIS — I2699 Other pulmonary embolism without acute cor pulmonale: Secondary | ICD-10-CM

## 2013-07-01 LAB — CREATININE, SERUM
Creatinine, Ser: 0.84 mg/dL (ref 0.50–1.10)
GFR calc Af Amer: 77 mL/min — ABNORMAL LOW (ref >90)
GFR calc non Af Amer: 67 mL/min — ABNORMAL LOW (ref >90)

## 2013-07-01 LAB — BUN: BUN: 21 mg/dL (ref 6–23)

## 2013-07-01 MED ORDER — IOHEXOL 350 MG/ML SOLN
100.0000 mL | Freq: Once | INTRAVENOUS | Status: AC | PRN
  Administered 2013-07-01: 100 mL via INTRAVENOUS

## 2013-07-05 ENCOUNTER — Encounter: Payer: Self-pay | Admitting: Physical Medicine and Rehabilitation

## 2013-07-05 ENCOUNTER — Encounter
Payer: Medicare Other | Attending: Physical Medicine and Rehabilitation | Admitting: Physical Medicine and Rehabilitation

## 2013-07-05 VITALS — BP 143/67 | HR 99 | Resp 14 | Ht 64.0 in | Wt 182.8 lb

## 2013-07-05 DIAGNOSIS — R209 Unspecified disturbances of skin sensation: Secondary | ICD-10-CM | POA: Diagnosis not present

## 2013-07-05 DIAGNOSIS — Z5181 Encounter for therapeutic drug level monitoring: Secondary | ICD-10-CM | POA: Diagnosis not present

## 2013-07-05 DIAGNOSIS — M549 Dorsalgia, unspecified: Secondary | ICD-10-CM | POA: Insufficient documentation

## 2013-07-05 DIAGNOSIS — M545 Low back pain, unspecified: Secondary | ICD-10-CM | POA: Diagnosis not present

## 2013-07-05 DIAGNOSIS — IMO0002 Reserved for concepts with insufficient information to code with codable children: Secondary | ICD-10-CM | POA: Diagnosis not present

## 2013-07-05 DIAGNOSIS — IMO0001 Reserved for inherently not codable concepts without codable children: Secondary | ICD-10-CM

## 2013-07-05 DIAGNOSIS — M629 Disorder of muscle, unspecified: Secondary | ICD-10-CM | POA: Diagnosis not present

## 2013-07-05 DIAGNOSIS — M2420 Disorder of ligament, unspecified site: Secondary | ICD-10-CM | POA: Diagnosis not present

## 2013-07-05 DIAGNOSIS — M797 Fibromyalgia: Secondary | ICD-10-CM

## 2013-07-05 DIAGNOSIS — M76899 Other specified enthesopathies of unspecified lower limb, excluding foot: Secondary | ICD-10-CM | POA: Diagnosis not present

## 2013-07-05 DIAGNOSIS — M47812 Spondylosis without myelopathy or radiculopathy, cervical region: Secondary | ICD-10-CM | POA: Diagnosis not present

## 2013-07-05 DIAGNOSIS — G894 Chronic pain syndrome: Secondary | ICD-10-CM

## 2013-07-05 DIAGNOSIS — Z79899 Other long term (current) drug therapy: Secondary | ICD-10-CM

## 2013-07-05 MED ORDER — HYDROCODONE-ACETAMINOPHEN 5-325 MG PO TABS: ORAL_TABLET | ORAL | Status: DC

## 2013-07-05 NOTE — Patient Instructions (Addendum)
Try to stay as active as tolerated, continue with your aquatic exercises

## 2013-07-05 NOTE — Progress Notes (Signed)
Subjective:    Patient ID: Kara Bush, female    DOB: May 08, 1941, 72 y.o.   MRN: [id number]  HPI The patient complains about chronic low back pain, neck pain and right shoulder blade pain . The patient also complains about intermittend, positional numbness and tingling in the C6 distribution.  The problem has been stable. The patient is doing water aerobics 3 times per week, and is paying attention to a good posture.  The pain around her right SI joint, has resolved. She uses the Flector patches when she has a flare up and they have helped her.   Pain Inventory Average Pain 4 Pain Right Now 4 My pain is aching  In the last 24 hours, has pain interfered with the following? General activity 2 Relation with others 0 Enjoyment of life 2 What TIME of day is your pain at its worst? evening Sleep (in general) Good  Pain is worse with: some activites Pain improves with: rest and medication Relief from Meds: 5  Mobility walk without assistance how many minutes can you walk? 10 ability to climb steps?  yes do you drive?  yes  Function retired  Neuro/Psych depression anxiety  Prior Studies Any changes since last visit?  no  Physicians involved in your care Any changes since last visit?  no   Family History  Problem Relation Age of Onset  . Heart disease Father   . Heart attack Father     @ 83  . Cancer Paternal Aunt     BREAST  . Heart failure Mother     at 70  . Healthy Sister   . Healthy Brother   . Heart failure Maternal Grandmother   . Heart attack Maternal Grandfather   . Heart attack Paternal Grandmother     at 22   History   Social History  . Marital Status: Divorced    Spouse Name: N/A    Number of Children: 2  . Years of Education: college    Occupational History  .     Social History Main Topics  . Smoking status: Former Smoker    Quit date: 06/29/1990  . Smokeless tobacco: Never Used  . Alcohol Use: No  . Drug Use: No  . Sexual  Activity: None   Other Topics Concern  . None   Social History Narrative  . None   Past Surgical History  Procedure Laterality Date  . Hernia repair    . Tubal ligation      BTL  . Coronary artery bypass graft  1998    LIMA-LAD;VG-DIAG & RCA  . Coronary stent placement  2005    TO LAD & LCX-STENTS,  . Aortobiexternal iliac bypass  1991  . Coronary angioplasty with stent placement  2006    to LAD, ATRETIC LIMA AT THAT TIME ALSO  . Coronary angioplasty with stent placement  2007    STENT TO PROX VG-DIAG, OTHER STENTS PATENT  . Cholecystectomy  1997   Past Medical History  Diagnosis Date  . Hypertension   . CAD (coronary artery disease)     hx CABG 1998, last cath 2007 with PCI and stenting to the proximal segment of the vein graft to the diagonal branch. DES Taxus was placed  . Depression   . Ulcerative colitis   . Hyperlipidemia   . Chronic fatigue fibromyalgia syndrome   . OSA (obstructive sleep apnea)   . PAD (peripheral artery disease)   . S/P resection of aortic aneurysm   .  Chest wall pain   . Cervicalgia   . Pes anserine bursitis   . Fibromyalgia   . Chronic pain syndrome   . Trochanteric bursitis   . Pulmonary embolism 01/11/2013  . Anemia, improved 01/11/2013  . History of nuclear stress test 08/07/2011    lexiscan; no evidence of ischemia or infarct; low risk   . Abdominal aortic aneurysm   . Ulcerative colitis   . Obstructive sleep apnea   . Fibromyalgia   . Coronary artery disease   . Hypertension   . Basal cell carcinoma    BP 143/67  Pulse 99  Resp 14  Ht 5\' 4"  (1.626 m)  Wt 182 lb 12.8 oz (82.918 kg)  BMI 31.36 kg/m2  SpO2 94%    Review of Systems  Psychiatric/Behavioral: Positive for dysphoric mood. The patient is nervous/anxious.   All other systems reviewed and are negative.       Objective:   Physical Exam Constitutional: She is oriented to person, place, and time. She appears well-developed and well-nourished.  HENT:  Head:  Normocephalic.  Neck: Neck supple.  Musculoskeletal: She exhibits tenderness.  Neurological: She is alert and oriented to person, place, and time.  Skin: Skin is warm and dry.  Psychiatric: She has a normal mood and affect.  Symmetric normal motor tone is noted throughout, except increased muscle tone in trapezius bilateral. Normal muscle bulk. Muscle testing reveals 5/5 muscle strength of the upper extremity, except wrist extension on the left, C6, and 5/5 of the lower extremity. Full range of motion in upper and lower extremities. ROM of spine is restricted. Fine motor movements are normal in both hands.  Sensory is intact and symmetric to light touch, pinprick and proprioception.  DTR in the upper and lower extremity are present and symmetric 3+. No clonus is noted.  Patient arises from chair without difficulty. Narrow based gait with normal arm swing bilateral , able to walk on heels and toes . Tandem walk is stable. No pronator drift. Rhomberg negative.  Good finger to nose and heel to shin testing. 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 Recent radiographs 2013 show degenerative changes at C5-6 as well as at C6-7 without evidence of pathological motion with flexion extension views, they also show short pedicles.  2.Bilateral pes anserine bursitis.  3.Mild trochanteric bursitis/iliotibial band syndrome.  4.History of fibromyalgia.  5.Brisk reflexes,most likely cervical spinal stenosis, short pedicles.  6. Bilateral intermittent hand numbness: Carpal tunnel syndrome versus C6 radiculopathy mild  7. LBP, with SI inflammation/irritation on the right.  Advised patient to continue with water exercises 3x per week, also advised patient to keep a good posture showed her how to correct her posture.Also showed her some stretching exercises for her SI joint. Flector patches, I prescribed for  inflammation/irritation of SI joint, and lumbar muscle strain, have given her good relief. Prescribed Robaxin for her muscle pain, at last visit, which she has tolerated well in the past, she uses it sparingly.  Refilled Hydrocodone 5/325, # 60 Follow up in 1 month.

## 2013-07-13 DIAGNOSIS — T7840XA Allergy, unspecified, initial encounter: Secondary | ICD-10-CM | POA: Diagnosis not present

## 2013-07-14 DIAGNOSIS — T7840XA Allergy, unspecified, initial encounter: Secondary | ICD-10-CM | POA: Diagnosis not present

## 2013-07-19 DIAGNOSIS — Z1231 Encounter for screening mammogram for malignant neoplasm of breast: Secondary | ICD-10-CM | POA: Diagnosis not present

## 2013-07-25 ENCOUNTER — Other Ambulatory Visit: Payer: Self-pay | Admitting: *Deleted

## 2013-07-25 MED ORDER — HYDROCODONE-ACETAMINOPHEN 5-325 MG PO TABS: ORAL_TABLET | ORAL | Status: DC

## 2013-07-25 NOTE — Telephone Encounter (Signed)
RX printed early for controlled medication for the visit with RN on 08/05/13 (to be signed by MD) 

## 2013-08-01 DIAGNOSIS — L821 Other seborrheic keratosis: Secondary | ICD-10-CM | POA: Diagnosis not present

## 2013-08-01 DIAGNOSIS — Z85828 Personal history of other malignant neoplasm of skin: Secondary | ICD-10-CM | POA: Diagnosis not present

## 2013-08-01 DIAGNOSIS — I781 Nevus, non-neoplastic: Secondary | ICD-10-CM | POA: Diagnosis not present

## 2013-08-01 DIAGNOSIS — D239 Other benign neoplasm of skin, unspecified: Secondary | ICD-10-CM | POA: Diagnosis not present

## 2013-08-05 ENCOUNTER — Encounter: Payer: Medicare Other | Attending: Physical Medicine & Rehabilitation | Admitting: *Deleted

## 2013-08-05 ENCOUNTER — Encounter: Payer: Self-pay | Admitting: *Deleted

## 2013-08-05 VITALS — BP 122/52 | HR 91 | Resp 14

## 2013-08-05 DIAGNOSIS — M25519 Pain in unspecified shoulder: Secondary | ICD-10-CM | POA: Diagnosis not present

## 2013-08-05 DIAGNOSIS — IMO0001 Reserved for inherently not codable concepts without codable children: Secondary | ICD-10-CM | POA: Insufficient documentation

## 2013-08-05 DIAGNOSIS — Z79899 Other long term (current) drug therapy: Secondary | ICD-10-CM | POA: Insufficient documentation

## 2013-08-05 DIAGNOSIS — M797 Fibromyalgia: Secondary | ICD-10-CM

## 2013-08-05 DIAGNOSIS — G8929 Other chronic pain: Secondary | ICD-10-CM

## 2013-08-05 NOTE — Patient Instructions (Signed)
Follow up one month with RN for pill count and med refill and 2 months with Dr Naaman Plummer.

## 2013-08-05 NOTE — Progress Notes (Signed)
Here for pill count and medication refills. Hydrocodone 5-325 # 22 Fill date 07/12/13   Today NV#12   Pill count appropriate. VSS    Pain level: 3 today.  She is having difficulty with just the two pills per day.  She has typically been taking one in the am and then half tablet  in the afternoon and half at night but this is not really easing her pain.  She has supplimented with tylenol.  We talked about staying below the recommended daily dose of tylenol keeping it below the 3000mg , especially in light of her past dx of hepatitis,  which she is doing.  I suggested she add a robaxin in the evening to see if that helps and if not she can call and we will see if Dr Naaman Plummer would be agreeable to increasing her to tid instead of bid. Her oioiod risk score done today is a 1.  Follow up in one month with RN for med refill and pill count and 2 months with Dr Naaman Plummer.

## 2013-08-10 DIAGNOSIS — K5190 Ulcerative colitis, unspecified, without complications: Secondary | ICD-10-CM | POA: Diagnosis not present

## 2013-09-07 ENCOUNTER — Other Ambulatory Visit: Payer: Self-pay | Admitting: *Deleted

## 2013-09-07 MED ORDER — HYDROCODONE-ACETAMINOPHEN 5-325 MG PO TABS: ORAL_TABLET | ORAL | Status: DC

## 2013-09-07 NOTE — Telephone Encounter (Signed)
RX printed early for controlled medication for the visit with RN on 09/13/13 (to be signed by MD)

## 2013-09-08 ENCOUNTER — Encounter: Payer: Self-pay | Admitting: *Deleted

## 2013-09-08 ENCOUNTER — Telehealth: Payer: Self-pay | Admitting: *Deleted

## 2013-09-08 ENCOUNTER — Encounter: Payer: Medicare Other | Admitting: Physical Medicine and Rehabilitation

## 2013-09-08 ENCOUNTER — Encounter: Payer: Medicare Other | Attending: Physical Medicine and Rehabilitation | Admitting: *Deleted

## 2013-09-08 VITALS — BP 145/56 | HR 95 | Resp 14 | Wt 181.0 lb

## 2013-09-08 DIAGNOSIS — R209 Unspecified disturbances of skin sensation: Secondary | ICD-10-CM | POA: Insufficient documentation

## 2013-09-08 DIAGNOSIS — M797 Fibromyalgia: Secondary | ICD-10-CM

## 2013-09-08 DIAGNOSIS — M545 Low back pain, unspecified: Secondary | ICD-10-CM | POA: Diagnosis not present

## 2013-09-08 DIAGNOSIS — M549 Dorsalgia, unspecified: Secondary | ICD-10-CM | POA: Diagnosis not present

## 2013-09-08 DIAGNOSIS — IMO0002 Reserved for concepts with insufficient information to code with codable children: Secondary | ICD-10-CM | POA: Diagnosis not present

## 2013-09-08 DIAGNOSIS — M2420 Disorder of ligament, unspecified site: Secondary | ICD-10-CM | POA: Diagnosis not present

## 2013-09-08 DIAGNOSIS — IMO0001 Reserved for inherently not codable concepts without codable children: Secondary | ICD-10-CM | POA: Insufficient documentation

## 2013-09-08 DIAGNOSIS — M4302 Spondylolysis, cervical region: Secondary | ICD-10-CM

## 2013-09-08 DIAGNOSIS — M76899 Other specified enthesopathies of unspecified lower limb, excluding foot: Secondary | ICD-10-CM | POA: Insufficient documentation

## 2013-09-08 DIAGNOSIS — M629 Disorder of muscle, unspecified: Secondary | ICD-10-CM | POA: Insufficient documentation

## 2013-09-08 DIAGNOSIS — M47812 Spondylosis without myelopathy or radiculopathy, cervical region: Secondary | ICD-10-CM | POA: Diagnosis not present

## 2013-09-08 NOTE — Telephone Encounter (Signed)
i am ok with her increasing to TID for now. She should check with her PCP/GI MD to make sure they are ok with the 1000mg  tylenol daily

## 2013-09-08 NOTE — Telephone Encounter (Signed)
Kara Bush was in to see me today for refills and we had discussed her increased pain on the prior visit.  I suggested she try adding the robaxin she had been prescribed and this did help but makes her sleepy so she limits it to the evening.  She is on hydrocodone 5/325 i bid and has been needing to take more often sometimes.  She has tried taking 1.5 tablet.  She is wanting to know if she can increase to TID.  It has been difficult getting her on your schedule so I told her I would ask you if you would agree to the increase. I think she said she had been taking this for 10 years.  She does have a dx of hepatitis though which might complicate the tylenol increase. Please advise.

## 2013-09-08 NOTE — Progress Notes (Signed)
Here for pill count and medication refills. Hydrocodone 5/325 #60  Fill date 08/11/13     Today NV#5  She says the addition of the robaxin helps but makes her sleepy so in the evening  It is ok but not during the day (we discussed this last visit).  She would like to know if Dr Naaman Plummer would be agreeable to her increasing the hydrocodone since some times she has had to take 1.5 tab instead of the one and she has been on it 10 years. I will send a message to him to see what he suggests.    VSS    Pain level:4 today.  Her fall risk is low but I have given her a handout with fall prevention tips for the home.  She will return to see Algis Liming PA next month and Dr Naaman Plummer the month after.Kara Bush

## 2013-09-08 NOTE — Patient Instructions (Signed)
Follow up one month with Dr Naaman Plummer or PA

## 2013-09-09 ENCOUNTER — Other Ambulatory Visit (HOSPITAL_COMMUNITY): Payer: Self-pay | Admitting: Internal Medicine

## 2013-09-09 DIAGNOSIS — I6529 Occlusion and stenosis of unspecified carotid artery: Secondary | ICD-10-CM

## 2013-09-15 DIAGNOSIS — F329 Major depressive disorder, single episode, unspecified: Secondary | ICD-10-CM | POA: Diagnosis not present

## 2013-09-15 DIAGNOSIS — F3289 Other specified depressive episodes: Secondary | ICD-10-CM | POA: Diagnosis not present

## 2013-09-15 DIAGNOSIS — D649 Anemia, unspecified: Secondary | ICD-10-CM | POA: Diagnosis not present

## 2013-09-15 DIAGNOSIS — E7800 Pure hypercholesterolemia, unspecified: Secondary | ICD-10-CM | POA: Diagnosis not present

## 2013-09-15 DIAGNOSIS — Z1331 Encounter for screening for depression: Secondary | ICD-10-CM | POA: Diagnosis not present

## 2013-09-15 DIAGNOSIS — G479 Sleep disorder, unspecified: Secondary | ICD-10-CM | POA: Diagnosis not present

## 2013-09-15 DIAGNOSIS — IMO0001 Reserved for inherently not codable concepts without codable children: Secondary | ICD-10-CM | POA: Diagnosis not present

## 2013-09-15 DIAGNOSIS — Z23 Encounter for immunization: Secondary | ICD-10-CM | POA: Diagnosis not present

## 2013-09-15 DIAGNOSIS — Z0000 Encounter for general adult medical examination without abnormal findings: Secondary | ICD-10-CM | POA: Diagnosis not present

## 2013-09-15 DIAGNOSIS — I10 Essential (primary) hypertension: Secondary | ICD-10-CM | POA: Diagnosis not present

## 2013-09-15 DIAGNOSIS — K5190 Ulcerative colitis, unspecified, without complications: Secondary | ICD-10-CM | POA: Diagnosis not present

## 2013-09-20 ENCOUNTER — Encounter (HOSPITAL_COMMUNITY): Payer: Medicare Other

## 2013-09-22 ENCOUNTER — Ambulatory Visit (HOSPITAL_COMMUNITY)
Admission: RE | Admit: 2013-09-22 | Discharge: 2013-09-22 | Disposition: A | Discharge status: Home / Self Care | Payer: Medicare Other | Source: Ambulatory Visit | Attending: Internal Medicine | Admitting: Internal Medicine

## 2013-09-22 DIAGNOSIS — I6529 Occlusion and stenosis of unspecified carotid artery: Secondary | ICD-10-CM | POA: Diagnosis not present

## 2013-09-22 DIAGNOSIS — I672 Cerebral atherosclerosis: Secondary | ICD-10-CM | POA: Insufficient documentation

## 2013-09-22 NOTE — Progress Notes (Signed)
Carotid Duplex Completed. Derrico Zhong, BS, RDMS, RVT  

## 2013-10-05 DIAGNOSIS — H18519 Endothelial corneal dystrophy, unspecified eye: Secondary | ICD-10-CM | POA: Diagnosis not present

## 2013-10-05 DIAGNOSIS — H02059 Trichiasis without entropian unspecified eye, unspecified eyelid: Secondary | ICD-10-CM | POA: Diagnosis not present

## 2013-10-06 ENCOUNTER — Encounter
Payer: Medicare Other | Attending: Physical Medicine and Rehabilitation | Admitting: Physical Medicine and Rehabilitation

## 2013-10-06 ENCOUNTER — Telehealth: Payer: Self-pay | Admitting: *Deleted

## 2013-10-06 ENCOUNTER — Encounter: Payer: Self-pay | Admitting: Physical Medicine and Rehabilitation

## 2013-10-06 VITALS — BP 114/58 | HR 102 | Resp 14 | Ht 64.0 in | Wt 182.0 lb

## 2013-10-06 DIAGNOSIS — I2510 Atherosclerotic heart disease of native coronary artery without angina pectoris: Secondary | ICD-10-CM | POA: Diagnosis not present

## 2013-10-06 DIAGNOSIS — I739 Peripheral vascular disease, unspecified: Secondary | ICD-10-CM

## 2013-10-06 DIAGNOSIS — IMO0001 Reserved for inherently not codable concepts without codable children: Secondary | ICD-10-CM | POA: Diagnosis not present

## 2013-10-06 DIAGNOSIS — Z950 Presence of cardiac pacemaker: Secondary | ICD-10-CM | POA: Insufficient documentation

## 2013-10-06 DIAGNOSIS — M47812 Spondylosis without myelopathy or radiculopathy, cervical region: Secondary | ICD-10-CM | POA: Diagnosis not present

## 2013-10-06 DIAGNOSIS — F3289 Other specified depressive episodes: Secondary | ICD-10-CM | POA: Diagnosis not present

## 2013-10-06 DIAGNOSIS — M797 Fibromyalgia: Secondary | ICD-10-CM

## 2013-10-06 DIAGNOSIS — E785 Hyperlipidemia, unspecified: Secondary | ICD-10-CM | POA: Diagnosis not present

## 2013-10-06 DIAGNOSIS — M25519 Pain in unspecified shoulder: Secondary | ICD-10-CM | POA: Insufficient documentation

## 2013-10-06 DIAGNOSIS — G8929 Other chronic pain: Secondary | ICD-10-CM | POA: Diagnosis not present

## 2013-10-06 DIAGNOSIS — I6529 Occlusion and stenosis of unspecified carotid artery: Secondary | ICD-10-CM

## 2013-10-06 DIAGNOSIS — F329 Major depressive disorder, single episode, unspecified: Secondary | ICD-10-CM | POA: Insufficient documentation

## 2013-10-06 DIAGNOSIS — I10 Essential (primary) hypertension: Secondary | ICD-10-CM | POA: Insufficient documentation

## 2013-10-06 DIAGNOSIS — M545 Low back pain, unspecified: Secondary | ICD-10-CM | POA: Insufficient documentation

## 2013-10-06 DIAGNOSIS — M542 Cervicalgia: Secondary | ICD-10-CM | POA: Diagnosis not present

## 2013-10-06 MED ORDER — HYDROCODONE-ACETAMINOPHEN 5-325 MG PO TABS: ORAL_TABLET | ORAL | Status: DC

## 2013-10-06 NOTE — Progress Notes (Signed)
Subjective:    Patient ID: Kara Bush, female    DOB: 10/04/1940, 73 y.o.   MRN: [id number]  HPI Marisel Bush is 73 year old female here for medication refill. She has chronic  neck pain, pain under right shoulder blade as well as low back ache/spasm.  She continues to have  intermittent, positional numbness and tingling in the C6 distribution but has learned to compensate and avoid activities/ movements that exacerbate these symptoms. She continues to do water aerobics 3 times per week. She plays bridge with a supportive group of friends and that helps with her quality of life. She continues to have generalized muscle aches due to fibromyalgia. She rates her pain level as 4/10 and gets 5/10 relief with pain medications. We discussed increasing her hydrocodone and she reports that she had normal blood work on her recent physical.    Pain Inventory Average Pain 3 Pain Right Now 3 My pain is aching  In the last 24 hours, has pain interfered with the following? General activity 5 Relation with others 5 Enjoyment of life 5 What TIME of day is your pain at its worst? morning Sleep (in general) Fair  Pain is worse with: bending Pain improves with: medication Relief from Meds: n/a  Mobility walk without assistance Do you have any goals in this area?  no  Function retired Do you have any goals in this area?  no  Neuro/Psych No problems in this area  Prior Studies Any changes since last visit?  no  Physicians involved in your care Any changes since last visit?  no   Family History  Problem Relation Age of Onset  . Heart disease Father   . Heart attack Father     @ 56  . Cancer Paternal Aunt     BREAST  . Heart failure Mother     at 80  . Healthy Sister   . Healthy Brother   . Heart failure Maternal Grandmother   . Heart attack Maternal Grandfather   . Heart attack Paternal Grandmother     at 66   History   Social History  . Marital Status: Divorced    Spouse Name: N/A    Number of Children: 2  . Years of Education: college    Occupational History  .     Social History Main Topics  . Smoking status: Former Smoker    Quit date: 06/29/1990  . Smokeless tobacco: Never Used  . Alcohol Use: No  . Drug Use: No  . Sexual Activity: None   Other Topics Concern  . None   Social History Narrative  . None   Past Surgical History  Procedure Laterality Date  . Hernia repair    . Tubal ligation      BTL  . Coronary artery bypass graft  1998    LIMA-LAD;VG-DIAG & RCA  . Coronary stent placement  2005    TO LAD & LCX-STENTS,  . Aortobiexternal iliac bypass  1991  . Coronary angioplasty with stent placement  2006    to LAD, ATRETIC LIMA AT THAT TIME ALSO  . Coronary angioplasty with stent placement  2007    STENT TO PROX VG-DIAG, OTHER STENTS PATENT  . Cholecystectomy  1997   Past Medical History  Diagnosis Date  . Hypertension   . CAD (coronary artery disease)     hx CABG 1998, last cath 2007 with PCI and stenting to the proximal segment of the vein graft to the  diagonal branch. DES Taxus was placed  . Depression   . Ulcerative colitis   . Hyperlipidemia   . Chronic fatigue fibromyalgia syndrome   . OSA (obstructive sleep apnea)   . PAD (peripheral artery disease)   . S/P resection of aortic aneurysm   . Chest wall pain   . Cervicalgia   . Pes anserine bursitis   . Fibromyalgia   . Chronic pain syndrome   . Trochanteric bursitis   . Pulmonary embolism 01/11/2013  . Anemia, improved 01/11/2013  . History of nuclear stress test 08/07/2011    lexiscan; no evidence of ischemia or infarct; low risk   . Abdominal aortic aneurysm   . Ulcerative colitis   . Obstructive sleep apnea   . Fibromyalgia   . Coronary artery disease   . Hypertension   . Basal cell carcinoma    BP 114/58  Pulse 102  Resp 14  Ht 5\' 4"  (1.626 m)  Wt 182 lb (82.555 kg)  BMI 31.22 kg/m2  SpO2 97%  Opioid Risk Score:   Fall Risk Score: Moderate  Fall Risk (6-13 points) (patient educated, handout declined)   Review of Systems  Respiratory: Negative for shortness of breath.   Gastrointestinal: Negative for constipation.  Musculoskeletal: Positive for arthralgias, back pain, myalgias and neck stiffness.  Neurological: Negative for weakness.  Psychiatric/Behavioral: Negative for sleep disturbance.  All other systems reviewed and are negative.       Objective:   Physical Exam  Nursing note and vitals reviewed. Constitutional: She is oriented to person, place, and time. She appears well-developed and well-nourished.  HENT:  Head: Normocephalic and atraumatic.  Eyes: Conjunctivae are normal. Pupils are equal, round, and reactive to light.  Neck: Normal range of motion. Neck supple.  Cardiovascular: Normal rate and regular rhythm.   Pulmonary/Chest: Effort normal and breath sounds normal.  Abdominal: Soft. Bowel sounds are normal. She exhibits no distension.  Musculoskeletal: She exhibits tenderness (Under right mid trapezius area as well as lower back). She exhibits no edema.  Neurological: She is alert and oriented to person, place, and time.  Symmetric normal motor tone is noted throughout. Increase in muscle tone in right trapezius. She has normal muscle bulk with 5/5 muscle strength of the upper extremity, except wrist extension on the left, C6. BLE strength is  5/5 with full range of motion in upper and lower extremities. ROM of spine is restricted.  Sensory is intact and symmetric to light touch. No clonus noted.    Skin: Skin is warm and dry.          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 Continue robaxin at nights to avoid sedative SE during the day.   2. History  of fibromyalgia: With diffuse muscle aches. She has used  cymbalta in the past but reported sedative effects.  Asked her to find out dosage and see if re-trial maybe of benefit.   3. Bilateral intermittent hand numbness: Carpal tunnel syndrome versus C6 radiculopathy mild   7. LBP as well as with bilateral trochanteric bursitis: Discussed increasing Norco to 2 1/5 to 3 pills daily as well as tolerance issues.  Refilled: Norco 5/325mg  # 90 pills--use one pill tid for pain.

## 2013-10-06 NOTE — Telephone Encounter (Signed)
Notified patient of carotid doppler results. Will order repeat doppler

## 2013-10-06 NOTE — Telephone Encounter (Signed)
Message copied by Fidel Levy on Thu Oct 06, 2013 11:28 AM ------      Message from: Pixie Casino      Created: Thu Oct 06, 2013 10:48 AM       No significant changes on carotid doppler. Recommend repeat in 6 months. Let her know.            Dr. Debara Pickett ------

## 2013-10-06 NOTE — Patient Instructions (Signed)
Check with Dr. Drema Dallas about the amount of tylenol--1000mg  with hydrocodone.  Avoid excess tylenol. Drink plenty of water.

## 2013-10-31 DIAGNOSIS — K5190 Ulcerative colitis, unspecified, without complications: Secondary | ICD-10-CM | POA: Diagnosis not present

## 2013-11-07 ENCOUNTER — Encounter: Payer: Self-pay | Admitting: Physical Medicine & Rehabilitation

## 2013-11-07 ENCOUNTER — Encounter: Payer: Medicare Other | Attending: Physical Medicine and Rehabilitation | Admitting: Physical Medicine & Rehabilitation

## 2013-11-07 VITALS — BP 136/68 | HR 91 | Resp 14 | Ht 64.0 in | Wt 182.6 lb

## 2013-11-07 DIAGNOSIS — M545 Low back pain, unspecified: Secondary | ICD-10-CM | POA: Diagnosis not present

## 2013-11-07 DIAGNOSIS — I6529 Occlusion and stenosis of unspecified carotid artery: Secondary | ICD-10-CM

## 2013-11-07 DIAGNOSIS — IMO0001 Reserved for inherently not codable concepts without codable children: Secondary | ICD-10-CM | POA: Insufficient documentation

## 2013-11-07 DIAGNOSIS — G9332 Myalgic encephalomyelitis/chronic fatigue syndrome: Secondary | ICD-10-CM

## 2013-11-07 DIAGNOSIS — M797 Fibromyalgia: Secondary | ICD-10-CM

## 2013-11-07 DIAGNOSIS — R5382 Chronic fatigue, unspecified: Secondary | ICD-10-CM | POA: Insufficient documentation

## 2013-11-07 DIAGNOSIS — Q7649 Other congenital malformations of spine, not associated with scoliosis: Secondary | ICD-10-CM | POA: Diagnosis not present

## 2013-11-07 DIAGNOSIS — G8929 Other chronic pain: Secondary | ICD-10-CM | POA: Insufficient documentation

## 2013-11-07 DIAGNOSIS — M4302 Spondylolysis, cervical region: Secondary | ICD-10-CM

## 2013-11-07 MED ORDER — HYDROCODONE-ACETAMINOPHEN 5-325 MG PO TABS: ORAL_TABLET | ORAL | Status: DC

## 2013-11-07 MED ORDER — GABAPENTIN 100 MG PO CAPS: 100.0000 mg | ORAL_CAPSULE | Freq: Three times a day (TID) | ORAL | Status: DC

## 2013-11-07 NOTE — Progress Notes (Signed)
Subjective:    Patient ID: Kara Bush, female    DOB: August 29, 1940, 73 y.o.   MRN: [id number]  HPI  This is a "follow up" visit for Mrs. Kara Bush who was initially a patient of Dr. Lucia Estelle. She has had chronic low back pain and generalized pain due to fibromyalgia.   She is involved in water exercises for her general pain.   For pain control, she uses three hydrcodone 5/325 per day on average. She uses robaxin for low back and leg cramps (usually after she's been up for awhile)---She uses flector patches on occasion.  She likes using a heating pad during the winter to help with pain. She is also on naproxen 1-2 times per day.    Typically Emanuella sleeps for about 6-7 hours. She feels rested in general when she awakens. Sometimes she'll take a morning nap if she's not gotten enough sleep.   Her biggest complaint is generalized tenderness throughout her body, where her skin feels tight, hypersensitive, etc. She was tried on lyrica remotely but was placed on too high a dose apparently, and she ultimately stopped the medication.     Pain Inventory Average Pain 4 Pain Right Now 4 My pain is aching  In the last 24 hours, has pain interfered with the following? General activity 2 Relation with others 0 Enjoyment of life 1 What TIME of day is your pain at its worst? morning Sleep (in general) Fair  Pain is worse with: bending Pain improves with: rest and medication Relief from Meds: 7  Mobility walk without assistance  Function retired  Neuro/Psych depression anxiety  Prior Studies Any changes since last visit?  no  Physicians involved in your care Any changes since last visit?  no   Family History  Problem Relation Age of Onset  . Heart disease Father   . Heart attack Father     @ 62  . Cancer Paternal Aunt     BREAST  . Heart failure Mother     at 21  . Healthy Sister   . Healthy Brother   . Heart failure Maternal Grandmother   . Heart attack  Maternal Grandfather   . Heart attack Paternal Grandmother     at 68   History   Social History  . Marital Status: Divorced    Spouse Name: N/A    Number of Children: 2  . Years of Education: college    Occupational History  .     Social History Main Topics  . Smoking status: Former Smoker    Quit date: 06/29/1990  . Smokeless tobacco: Never Used  . Alcohol Use: No  . Drug Use: No  . Sexual Activity: None   Other Topics Concern  . None   Social History Narrative  . None   Past Surgical History  Procedure Laterality Date  . Hernia repair    . Tubal ligation      BTL  . Coronary artery bypass graft  1998    LIMA-LAD;VG-DIAG & RCA  . Coronary stent placement  2005    TO LAD & LCX-STENTS,  . Aortobiexternal iliac bypass  1991  . Coronary angioplasty with stent placement  2006    to LAD, ATRETIC LIMA AT THAT TIME ALSO  . Coronary angioplasty with stent placement  2007    STENT TO PROX VG-DIAG, OTHER STENTS PATENT  . Cholecystectomy  1997   Past Medical History  Diagnosis Date  . Hypertension   . CAD (coronary  artery disease)     hx CABG 1998, last cath 2007 with PCI and stenting to the proximal segment of the vein graft to the diagonal branch. DES Taxus was placed  . Depression   . Ulcerative colitis   . Hyperlipidemia   . Chronic fatigue fibromyalgia syndrome   . OSA (obstructive sleep apnea)   . PAD (peripheral artery disease)   . S/P resection of aortic aneurysm   . Chest wall pain   . Cervicalgia   . Pes anserine bursitis   . Fibromyalgia   . Chronic pain syndrome   . Trochanteric bursitis   . Pulmonary embolism 01/11/2013  . Anemia, improved 01/11/2013  . History of nuclear stress test 08/07/2011    lexiscan; no evidence of ischemia or infarct; low risk   . Abdominal aortic aneurysm   . Ulcerative colitis   . Obstructive sleep apnea   . Fibromyalgia   . Coronary artery disease   . Hypertension   . Basal cell carcinoma    BP 136/68  Pulse 91   Resp 14  Ht 5\' 4"  (1.626 m)  Wt 182 lb 9.6 oz (82.827 kg)  BMI 31.33 kg/m2  SpO2 96%  Opioid Risk Score:   Fall Risk Score:    Review of Systems  Musculoskeletal: Positive for back pain.       Sub scapular right  And hip pain  Psychiatric/Behavioral: Positive for dysphoric mood. The patient is nervous/anxious.   All other systems reviewed and are negative.       Objective:   Physical Exam  Constitutional: She is oriented to person, place, and time. She appears well-developed and well-nourished. She is mildly obese HENT:  Head: Normocephalic and atraumatic.  Eyes: Conjunctivae are normal. Pupils are equal, round, and reactive to light.  Neck: Normal range of motion. Neck supple.  Cardiovascular: Normal rate and regular rhythm.  Pulmonary/Chest: Effort normal and breath sounds normal.  Abdominal: Soft. Bowel sounds are normal. She exhibits no distension.  Musculoskeletal: She exhibits tenderness (Under right mid trapezius area as well as lower back). She exhibits no edema.  Neurological: She is alert and oriented to person, place, and time.  Symmetric normal motor tone is noted throughout. Increase in muscle tone in right trapezius. She has normal muscle bulk with 5/5 muscle strength of the upper extremity, except wrist extension on the left, C6. BLE strength is 5/5 with full range of motion in upper and lower extremities. ROM of spine is restricted with flexion more than in other planes. She has very tight rigth lower lumbar paraspinals. SLR/facet maneuvers were negative. She has numerous tender points throughout the trunk and limbs.  Sensory is intact and symmetric to light touch. No clonus noted.  Skin: Skin is warm and dry.    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 Continue robaxin at  nights to avoid sedative SE during the day.  2. Fibromyalgia:  -would like to begin low dose gabapentin to treat her generalized pain and muscle aches. Consider other agents as well including gabapentin  -discussed increasing exercise as tolerated.   - would also like to consider focuses PT to address her low back posture, ROM, etc 3. Bilateral intermittent hand numbness: Carpal tunnel syndrome versus C6 radiculopathy mild---minimal at present  4. LBP as well as with bilateral trochanteric bursitis:    -  refilled Norco 5/325mg  # 90 pills--use one pill tid for pain.  5. Follow up with me in about 2 months.  A second rx for hydrocodone was given today.

## 2013-11-07 NOTE — Patient Instructions (Signed)
TRY TAKING GABAPENTIN 100MG  AT NIGHT FOR ONE WEEK. INCREASE TO 100MG  TWICE DAILY IF NEEDED AND TOLERATED.

## 2013-11-14 ENCOUNTER — Other Ambulatory Visit (HOSPITAL_COMMUNITY): Payer: Self-pay | Admitting: Internal Medicine

## 2013-11-14 ENCOUNTER — Other Ambulatory Visit: Payer: Self-pay

## 2013-11-14 MED ORDER — METOPROLOL SUCCINATE ER 25 MG PO TB24: 12.5000 mg | ORAL_TABLET | Freq: Every day | ORAL | Status: DC

## 2013-11-14 NOTE — Telephone Encounter (Signed)
Rx was sent to pharmacy electronically. 

## 2013-12-20 ENCOUNTER — Other Ambulatory Visit: Payer: Self-pay | Admitting: *Deleted

## 2013-12-20 DIAGNOSIS — R5381 Other malaise: Secondary | ICD-10-CM | POA: Diagnosis not present

## 2013-12-20 DIAGNOSIS — R5383 Other fatigue: Secondary | ICD-10-CM | POA: Diagnosis not present

## 2013-12-20 DIAGNOSIS — K5190 Ulcerative colitis, unspecified, without complications: Secondary | ICD-10-CM | POA: Diagnosis not present

## 2013-12-20 DIAGNOSIS — M461 Sacroiliitis, not elsewhere classified: Secondary | ICD-10-CM | POA: Diagnosis not present

## 2013-12-20 DIAGNOSIS — IMO0001 Reserved for inherently not codable concepts without codable children: Secondary | ICD-10-CM | POA: Diagnosis not present

## 2013-12-20 MED ORDER — METOPROLOL SUCCINATE ER 25 MG PO TB24: 12.5000 mg | ORAL_TABLET | Freq: Every day | ORAL | Status: DC

## 2013-12-20 NOTE — Telephone Encounter (Signed)
Rx refill switched to a 90 day supply and sent to pharmacy

## 2014-01-04 ENCOUNTER — Encounter: Payer: Self-pay | Admitting: Physical Medicine & Rehabilitation

## 2014-01-04 ENCOUNTER — Encounter: Payer: Medicare Other | Attending: Physical Medicine and Rehabilitation | Admitting: Physical Medicine & Rehabilitation

## 2014-01-04 VITALS — BP 123/68 | HR 91 | Resp 14 | Ht 64.0 in | Wt 184.0 lb

## 2014-01-04 DIAGNOSIS — M797 Fibromyalgia: Secondary | ICD-10-CM

## 2014-01-04 DIAGNOSIS — M545 Low back pain, unspecified: Secondary | ICD-10-CM | POA: Diagnosis not present

## 2014-01-04 DIAGNOSIS — I6529 Occlusion and stenosis of unspecified carotid artery: Secondary | ICD-10-CM

## 2014-01-04 DIAGNOSIS — Q7649 Other congenital malformations of spine, not associated with scoliosis: Secondary | ICD-10-CM

## 2014-01-04 DIAGNOSIS — G9332 Myalgic encephalomyelitis/chronic fatigue syndrome: Secondary | ICD-10-CM | POA: Insufficient documentation

## 2014-01-04 DIAGNOSIS — IMO0001 Reserved for inherently not codable concepts without codable children: Secondary | ICD-10-CM | POA: Insufficient documentation

## 2014-01-04 DIAGNOSIS — M4302 Spondylolysis, cervical region: Secondary | ICD-10-CM

## 2014-01-04 DIAGNOSIS — R5382 Chronic fatigue, unspecified: Secondary | ICD-10-CM | POA: Diagnosis not present

## 2014-01-04 MED ORDER — HYDROCODONE-ACETAMINOPHEN 5-325 MG PO TABS: ORAL_TABLET | ORAL | Status: DC

## 2014-01-04 NOTE — Progress Notes (Signed)
Subjective:    Patient ID: Kara Bush, female    DOB: 03-25-1941, 73 y.o.   MRN: [id number]  HPI   Chyna is back regarding her chronic pain syndrome. She has been doing fairly well. She continues with her water exercise at the [facility]. She typically works with the [employer] group.   Today she complains of more pain under right shoulder blade area. It seems to have started after a trip to [facility] for a wedding. It is a little more stiff today than usual.   At last visit, we initiated a low dose of gabapentin. She hasn't taken them consistently however. She usually is only taking it once per day at this time.   She is using her hydrocodone as prescribed.      Pain Inventory Average Pain 4 Pain Right Now 4 My pain is n/a  In the last 24 hours, has pain interfered with the following? General activity 2 Relation with others 0 Enjoyment of life 2 What TIME of day is your pain at its worst? daytime Sleep (in general) Good  Pain is worse with: bending and some activites Pain improves with: medication Relief from Meds: 8  Mobility walk without assistance Do you have any goals in this area?  no  Function retired Do you have any goals in this area?  no  Neuro/Psych anxiety  Prior Studies Any changes since last visit?  no  Physicians involved in your care Any changes since last visit?  no   Family History  Problem Relation Age of Onset  . Heart disease Father   . Heart attack Father     @ 40  . Cancer Paternal Aunt     BREAST  . Heart failure Mother     at 69  . Healthy Sister   . Healthy Brother   . Heart failure Maternal Grandmother   . Heart attack Maternal Grandfather   . Heart attack Paternal Grandmother     at 86   History   Social History  . Marital Status: Divorced    Spouse Name: N/A    Number of Children: 2  . Years of Education: college    Occupational History  .     Social History Main Topics  . Smoking status: Former Smoker    Quit date:  06/29/1990  . Smokeless tobacco: Never Used  . Alcohol Use: No  . Drug Use: No  . Sexual Activity: None   Other Topics Concern  . None   Social History Narrative  . None   Past Surgical History  Procedure Laterality Date  . Hernia repair    . Tubal ligation      BTL  . Coronary artery bypass graft  1998    LIMA-LAD;VG-DIAG & RCA  . Coronary stent placement  2005    TO LAD & LCX-STENTS,  . Aortobiexternal iliac bypass  1991  . Coronary angioplasty with stent placement  2006    to LAD, ATRETIC LIMA AT THAT TIME ALSO  . Coronary angioplasty with stent placement  2007    STENT TO PROX VG-DIAG, OTHER STENTS PATENT  . Cholecystectomy  1997   Past Medical History  Diagnosis Date  . Hypertension   . CAD (coronary artery disease)     hx CABG 1998, last cath 2007 with PCI and stenting to the proximal segment of the vein graft to the diagonal branch. DES Taxus was placed  . Depression   . Ulcerative colitis   .  Hyperlipidemia   . Chronic fatigue fibromyalgia syndrome   . OSA (obstructive sleep apnea)   . PAD (peripheral artery disease)   . S/P resection of aortic aneurysm   . Chest wall pain   . Cervicalgia   . Pes anserine bursitis   . Fibromyalgia   . Chronic pain syndrome   . Trochanteric bursitis   . Pulmonary embolism 01/11/2013  . Anemia, improved 01/11/2013  . History of nuclear stress test 08/07/2011    lexiscan; no evidence of ischemia or infarct; low risk   . Abdominal aortic aneurysm   . Ulcerative colitis   . Obstructive sleep apnea   . Fibromyalgia   . Coronary artery disease   . Hypertension   . Basal cell carcinoma    BP 123/68  Pulse 91  Resp 14  Ht 5\' 4"  (1.626 m)  Wt 184 lb (83.462 kg)  BMI 31.57 kg/m2  SpO2 97%  Opioid Risk Score:   Fall Risk Score: Moderate Fall Risk (6-13 points) (patient educated handout declined)   Review of Systems  Musculoskeletal: Positive for back pain and neck pain.  Psychiatric/Behavioral: The patient is  nervous/anxious.   All other systems reviewed and are negative.      Objective:   Physical Exam  Constitutional: She is oriented to person, place, and time. She appears well-developed and well-nourished. She is mildly obese  HENT:  Head: Normocephalic and atraumatic.  Eyes: Conjunctivae are normal. Pupils are equal, round, and reactive to light.  Neck: Normal range of motion. Neck supple.  Cardiovascular: Normal rate and regular rhythm.  Pulmonary/Chest: Effort normal and breath sounds normal.  Abdominal: Soft. Bowel sounds are normal. She exhibits no distension.  Musculoskeletal: She exhibits tenderness (Under right mid trapezius area as well as lower back). She exhibits no edema.  Neurological: She is alert and oriented to person, place, and time.  Symmetric normal motor tone is noted throughout. Increase in muscle tone in right rhomboids. Palpable TPI's along lower rhomboids. She has normal muscle bulk with 5/5 muscle strength of the upper extremity, except wrist extension on the left, C6. BLE strength is 5/5 with full range of motion in upper and lower extremities. . She has numerous tender points throughout the trunk and limbs.  Sensory is intact and symmetric to light touch. No clonus noted.  Skin: Skin is warm and dry.    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 Continue robaxin at nights.  2. Fibromyalgia:  -continue to titrate gabapentin as tolerated  -consider TPI's. Reviewed scapular ROM exercises  -maintain aquatic program  3. Bilateral intermittent hand numbness: Carpal tunnel syndrome versus C6 radiculopathy mild---minimal at present  4. LBP as well as with bilateral trochanteric bursitis:  -refilled Norco 5/325mg  # 90 pills--use one pill tid for pain.  5. Follow up with me in  about 2 months. A second rx for hydrocodone was given today.

## 2014-01-04 NOTE — Patient Instructions (Signed)
PLEASE CALL ME WITH ANY PROBLEMS OR QUESTIONS (#[phone]).      

## 2014-02-23 ENCOUNTER — Ambulatory Visit
Admission: RE | Admit: 2014-02-23 | Discharge: 2014-02-23 | Disposition: A | Discharge status: Home / Self Care | Payer: Medicare Other | Source: Ambulatory Visit | Attending: Family Medicine | Admitting: Family Medicine

## 2014-02-23 ENCOUNTER — Other Ambulatory Visit: Payer: Self-pay | Admitting: Family Medicine

## 2014-02-23 DIAGNOSIS — R109 Unspecified abdominal pain: Secondary | ICD-10-CM | POA: Diagnosis not present

## 2014-02-23 DIAGNOSIS — R1084 Generalized abdominal pain: Secondary | ICD-10-CM

## 2014-02-23 DIAGNOSIS — R5381 Other malaise: Secondary | ICD-10-CM | POA: Diagnosis not present

## 2014-02-23 DIAGNOSIS — R5383 Other fatigue: Secondary | ICD-10-CM | POA: Diagnosis not present

## 2014-02-23 DIAGNOSIS — R079 Chest pain, unspecified: Secondary | ICD-10-CM | POA: Diagnosis not present

## 2014-02-23 DIAGNOSIS — R1900 Intra-abdominal and pelvic swelling, mass and lump, unspecified site: Secondary | ICD-10-CM | POA: Diagnosis not present

## 2014-02-23 DIAGNOSIS — K449 Diaphragmatic hernia without obstruction or gangrene: Secondary | ICD-10-CM | POA: Diagnosis not present

## 2014-02-23 DIAGNOSIS — K5730 Diverticulosis of large intestine without perforation or abscess without bleeding: Secondary | ICD-10-CM | POA: Diagnosis not present

## 2014-02-23 MED ORDER — IOHEXOL 300 MG/ML  SOLN
125.0000 mL | Freq: Once | INTRAMUSCULAR | Status: AC | PRN
  Administered 2014-02-23: 125 mL via INTRAVENOUS

## 2014-02-23 MED ORDER — IOHEXOL 300 MG/ML  SOLN
40.0000 mL | Freq: Once | INTRAMUSCULAR | Status: AC | PRN
  Administered 2014-02-23: 40 mL via ORAL

## 2014-03-02 DIAGNOSIS — E7800 Pure hypercholesterolemia, unspecified: Secondary | ICD-10-CM | POA: Diagnosis not present

## 2014-03-02 DIAGNOSIS — E878 Other disorders of electrolyte and fluid balance, not elsewhere classified: Secondary | ICD-10-CM | POA: Diagnosis not present

## 2014-03-08 ENCOUNTER — Encounter: Payer: Self-pay | Admitting: Physical Medicine & Rehabilitation

## 2014-03-08 ENCOUNTER — Encounter: Payer: Medicare Other | Attending: Physical Medicine and Rehabilitation | Admitting: Physical Medicine & Rehabilitation

## 2014-03-08 VITALS — BP 147/50 | HR 82 | Resp 14 | Wt 183.4 lb

## 2014-03-08 DIAGNOSIS — M4302 Spondylolysis, cervical region: Secondary | ICD-10-CM

## 2014-03-08 DIAGNOSIS — Q7649 Other congenital malformations of spine, not associated with scoliosis: Secondary | ICD-10-CM

## 2014-03-08 DIAGNOSIS — M545 Low back pain, unspecified: Secondary | ICD-10-CM | POA: Diagnosis not present

## 2014-03-08 DIAGNOSIS — R5382 Chronic fatigue, unspecified: Secondary | ICD-10-CM

## 2014-03-08 DIAGNOSIS — I6529 Occlusion and stenosis of unspecified carotid artery: Secondary | ICD-10-CM

## 2014-03-08 DIAGNOSIS — IMO0001 Reserved for inherently not codable concepts without codable children: Secondary | ICD-10-CM

## 2014-03-08 DIAGNOSIS — G9332 Myalgic encephalomyelitis/chronic fatigue syndrome: Secondary | ICD-10-CM | POA: Diagnosis not present

## 2014-03-08 DIAGNOSIS — M797 Fibromyalgia: Secondary | ICD-10-CM

## 2014-03-08 MED ORDER — HYDROCODONE-ACETAMINOPHEN 5-325 MG PO TABS: ORAL_TABLET | ORAL | Status: DC

## 2014-03-08 MED ORDER — VENLAFAXINE HCL 75 MG PO TABS: 75.0000 mg | ORAL_TABLET | Freq: Two times a day (BID) | ORAL | Status: DC

## 2014-03-08 NOTE — Progress Notes (Signed)
Subjective:    Patient ID: Kara Bush, female    DOB: [birth year], 73 y.o.   MRN: [id number]  HPI  Kara Bush is  Back regarding her chronic pain. She has felt that her malaise/fatigue has been worse. She had a CT of the abdomen done in July which was negative for any changes.  She reports that her sleep has been adequate.  A recent lab work up was negative.  She continues to exercise regularly. She is taking her medications as prescribed. There have been no changes in doses of her medications.     Pain Inventory Average Pain 3 Pain Right Now 3 My pain is aching  In the last 24 hours, has pain interfered with the following? General activity 2 Relation with others 0 Enjoyment of life 2 What TIME of day is your pain at its worst? evening Sleep (in general) Good  Pain is worse with: some activites Pain improves with: medication Relief from Meds: 6  Mobility walk without assistance how many minutes can you walk? 10  Function retired  Neuro/Psych depression  Prior Studies Any changes since last visit?  yes CT/MRI  Physicians involved in your care Any changes since last visit?  no   Family History  Problem Relation Age of Onset  . Heart disease Father   . Heart attack Father     @ 81  . Cancer Paternal Aunt     BREAST  . Heart failure Mother     at 79  . Healthy Sister   . Healthy Brother   . Heart failure Maternal Grandmother   . Heart attack Maternal Grandfather   . Heart attack Paternal Grandmother     at 91   History   Social History  . Marital Status: Divorced    Spouse Name: N/A    Number of Children: 2  . Years of Education: college    Occupational History  .     Social History Main Topics  . Smoking status: Former Smoker    Quit date: 06/29/1990  . Smokeless tobacco: Never Used  . Alcohol Use: No  . Drug Use: No  . Sexual Activity: None   Other Topics Concern  . None   Social History Narrative  . None   Past  Surgical History  Procedure Laterality Date  . Hernia repair    . Tubal ligation      BTL  . Coronary artery bypass graft  1998    LIMA-LAD;VG-DIAG & RCA  . Coronary stent placement  2005    TO LAD & LCX-STENTS,  . Aortobiexternal iliac bypass  1991  . Coronary angioplasty with stent placement  2006    to LAD, ATRETIC LIMA AT THAT TIME ALSO  . Coronary angioplasty with stent placement  2007    STENT TO PROX VG-DIAG, OTHER STENTS PATENT  . Cholecystectomy  1997   Past Medical History  Diagnosis Date  . Hypertension   . CAD (coronary artery disease)     hx CABG 1998, last cath 2007 with PCI and stenting to the proximal segment of the vein graft to the diagonal branch. DES Taxus was placed  . Depression   . Ulcerative colitis   . Hyperlipidemia   . Chronic fatigue fibromyalgia syndrome   . OSA (obstructive sleep apnea)   . PAD (peripheral artery disease)   . S/P resection of aortic aneurysm   . Chest wall pain   . Cervicalgia   . Pes anserine bursitis   .  Fibromyalgia   . Chronic pain syndrome   . Trochanteric bursitis   . Pulmonary embolism 01/11/2013  . Anemia, improved 01/11/2013  . History of nuclear stress test 08/07/2011    lexiscan; no evidence of ischemia or infarct; low risk   . Abdominal aortic aneurysm   . Ulcerative colitis   . Obstructive sleep apnea   . Fibromyalgia   . Coronary artery disease   . Hypertension   . Basal cell carcinoma    BP 147/50  Pulse 82  Resp 14  Wt 183 lb 6.4 oz (83.19 kg)  SpO2 99%  Opioid Risk Score:   Fall Risk Score: Moderate Fall Risk (6-13 points) (previously educated and given handout)  Review of Systems  Psychiatric/Behavioral: Positive for dysphoric mood.  All other systems reviewed and are negative.      Objective:   Physical Exam  Constitutional: She is oriented to person, place, and time. She appears well-developed and well-nourished. She is mildly obese  HENT:  Head: Normocephalic and atraumatic.  Eyes:  Conjunctivae are normal. Pupils are equal, round, and reactive to light.  Neck: Normal range of motion. Neck supple.  Cardiovascular: Normal rate and regular rhythm.  Pulmonary/Chest: Effort normal and breath sounds normal.  Abdominal: Soft. Bowel sounds are normal. She exhibits no distension.  Musculoskeletal: She exhibits tenderness (Under right mid trapezius area as well as lower back). She exhibits no edema.  Neurological: She is alert and oriented to person, place, and time.    Palpable TPI's along lower rhomboids once again. She has normal muscle bulk with 5/5 muscle strength of the upper extremity, except wrist extension on the left, C6. BLE strength is 5/5 with full range of motion in upper and lower extremities. . She has numerous tender points throughout the trunk and limbs. She is able to bend over easily and touch her toes Sensory is intact and symmetric to light touch.    Skin: Skin is warm and dry.  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 Continue robaxin at nights.  2. Fibromyalgia:  -increase gabapentin to 200-300mg  qhs---increase day time doses if needed and tolerated -consider TPI's. Reviewed scapular ROM exercises again  -maintain aquatic program  3. Bilateral intermittent hand numbness: Carpal tunnel syndrome versus C6 radiculopathy mild  4. LBP as well as with bilateral trochanteric bursitis:  -refilled Norco 5/325mg  # 90 pills--use one pill tid for pain.  5. Follow up with me in about 2 months. A second rx for hydrocodone was given today.  6. Nausea: recommend holding naproxen for now and observe.  7. Trial of effexor for pain/energy in place of celexa---she can start in two weeks  -also recommended a trial of DHEA OTC for energy.

## 2014-03-08 NOTE — Patient Instructions (Addendum)
1. Hold naproxen 2. Begin 200mg  gabapentin at night for 4 days then increase to 300mg  if tolerated.  3. In about 2 weeks begin effexor. Start with one tablet in the am for one week then increase to twice daily. You may stop the celexa immediately upon starting the effexor.  4. Continue with shoulder range of motion and your exercise program.

## 2014-03-14 DIAGNOSIS — K449 Diaphragmatic hernia without obstruction or gangrene: Secondary | ICD-10-CM | POA: Diagnosis not present

## 2014-03-14 DIAGNOSIS — E7800 Pure hypercholesterolemia, unspecified: Secondary | ICD-10-CM | POA: Diagnosis not present

## 2014-03-14 DIAGNOSIS — I10 Essential (primary) hypertension: Secondary | ICD-10-CM | POA: Diagnosis not present

## 2014-03-14 DIAGNOSIS — F329 Major depressive disorder, single episode, unspecified: Secondary | ICD-10-CM | POA: Diagnosis not present

## 2014-03-14 DIAGNOSIS — Z1331 Encounter for screening for depression: Secondary | ICD-10-CM | POA: Diagnosis not present

## 2014-03-14 DIAGNOSIS — G479 Sleep disorder, unspecified: Secondary | ICD-10-CM | POA: Diagnosis not present

## 2014-03-14 DIAGNOSIS — IMO0001 Reserved for inherently not codable concepts without codable children: Secondary | ICD-10-CM | POA: Diagnosis not present

## 2014-04-06 ENCOUNTER — Ambulatory Visit (HOSPITAL_COMMUNITY)
Admission: RE | Admit: 2014-04-06 | Discharge: 2014-04-06 | Disposition: A | Discharge status: Home / Self Care | Payer: Medicare Other | Source: Ambulatory Visit | Attending: Internal Medicine | Admitting: Internal Medicine

## 2014-04-06 DIAGNOSIS — I739 Peripheral vascular disease, unspecified: Secondary | ICD-10-CM | POA: Insufficient documentation

## 2014-04-06 DIAGNOSIS — I6529 Occlusion and stenosis of unspecified carotid artery: Secondary | ICD-10-CM

## 2014-04-06 NOTE — Progress Notes (Signed)
Carotid Duplex Completed. Hassaan Crite, BS, RDMS, RVT  

## 2014-04-17 ENCOUNTER — Other Ambulatory Visit: Payer: Self-pay | Admitting: Internal Medicine

## 2014-04-17 MED ORDER — SIMVASTATIN 40 MG PO TABS: 40.0000 mg | ORAL_TABLET | Freq: Every day | ORAL | Status: DC

## 2014-05-03 DIAGNOSIS — H02059 Trichiasis without entropian unspecified eye, unspecified eyelid: Secondary | ICD-10-CM | POA: Diagnosis not present

## 2014-05-03 DIAGNOSIS — H2510 Age-related nuclear cataract, unspecified eye: Secondary | ICD-10-CM | POA: Diagnosis not present

## 2014-05-08 ENCOUNTER — Encounter: Payer: Self-pay | Admitting: Physical Medicine & Rehabilitation

## 2014-05-08 ENCOUNTER — Encounter: Payer: Medicare Other | Attending: Physical Medicine and Rehabilitation | Admitting: Physical Medicine & Rehabilitation

## 2014-05-08 VITALS — BP 148/67 | HR 95 | Resp 14 | Wt 184.0 lb

## 2014-05-08 DIAGNOSIS — M545 Low back pain, unspecified: Secondary | ICD-10-CM

## 2014-05-08 DIAGNOSIS — Z79899 Other long term (current) drug therapy: Secondary | ICD-10-CM

## 2014-05-08 DIAGNOSIS — I6529 Occlusion and stenosis of unspecified carotid artery: Secondary | ICD-10-CM

## 2014-05-08 DIAGNOSIS — M797 Fibromyalgia: Secondary | ICD-10-CM

## 2014-05-08 DIAGNOSIS — Z5181 Encounter for therapeutic drug level monitoring: Secondary | ICD-10-CM

## 2014-05-08 DIAGNOSIS — M4302 Spondylolysis, cervical region: Secondary | ICD-10-CM | POA: Diagnosis not present

## 2014-05-08 DIAGNOSIS — R5382 Chronic fatigue, unspecified: Secondary | ICD-10-CM | POA: Diagnosis not present

## 2014-05-08 MED ORDER — HYDROCODONE-ACETAMINOPHEN 5-325 MG PO TABS: ORAL_TABLET | ORAL | Status: DC

## 2014-05-08 MED ORDER — METHOCARBAMOL 500 MG PO TABS: 500.0000 mg | ORAL_TABLET | Freq: Three times a day (TID) | ORAL | Status: DC

## 2014-05-08 NOTE — Patient Instructions (Signed)
PLEASE CALL ME WITH ANY PROBLEMS OR QUESTIONS (#[phone]).      

## 2014-05-08 NOTE — Progress Notes (Signed)
Subjective:    Patient ID: Kara Bush, female    DOB: [birth year], 73 y.o.   MRN: [id number]  HPI  Kara Bush is back regarding her chronic pain. She has been stable with her pain levels.   She didn't start the effexor as "she didn't want to disturb what was working for her." she never continude the gabapentin either as the 100mg  wasn't helping.  She continues with the hydrocodone which seems to still work well for her.  She remains very active with exercises, ;particularly aerobic exercises.    Pain Inventory Average Pain 3 Pain Right Now 3 My pain is aching  In the last 24 hours, has pain interfered with the following? General activity 0 Relation with others 0 Enjoyment of life 0 What TIME of day is your pain at its worst? daytime Sleep (in general) Fair  Pain is worse with: bending and some activites Pain improves with: medication Relief from Meds: 5  Mobility walk without assistance  Function retired  Neuro/Psych No problems in this area  Prior Studies Any changes since last visit?  no  Physicians involved in your care Any changes since last visit?  no   Family History  Problem Relation Age of Onset  . Heart disease Father   . Heart attack Father     @ 56  . Cancer Paternal Aunt     BREAST  . Heart failure Mother     at 31  . Healthy Sister   . Healthy Brother   . Heart failure Maternal Grandmother   . Heart attack Maternal Grandfather   . Heart attack Paternal Grandmother     at 46   History   Social History  . Marital Status: Divorced    Spouse Name: N/A    Number of Children: 2  . Years of Education: college    Occupational History  .     Social History Main Topics  . Smoking status: Former Smoker    Quit date: 06/29/1990  . Smokeless tobacco: Never Used  . Alcohol Use: No  . Drug Use: No  . Sexual Activity: None   Other Topics Concern  . None   Social History Narrative  . None   Past Surgical History  Procedure  Laterality Date  . Hernia repair    . Tubal ligation      BTL  . Coronary artery bypass graft  1998    LIMA-LAD;VG-DIAG & RCA  . Coronary stent placement  2005    TO LAD & LCX-STENTS,  . Aortobiexternal iliac bypass  1991  . Coronary angioplasty with stent placement  2006    to LAD, ATRETIC LIMA AT THAT TIME ALSO  . Coronary angioplasty with stent placement  2007    STENT TO PROX VG-DIAG, OTHER STENTS PATENT  . Cholecystectomy  1997   Past Medical History  Diagnosis Date  . Hypertension   . CAD (coronary artery disease)     hx CABG 1998, last cath 2007 with PCI and stenting to the proximal segment of the vein graft to the diagonal branch. DES Taxus was placed  . Depression   . Ulcerative colitis   . Hyperlipidemia   . Chronic fatigue fibromyalgia syndrome   . OSA (obstructive sleep apnea)   . PAD (peripheral artery disease)   . S/P resection of aortic aneurysm   . Chest wall pain   . Cervicalgia   . Pes anserine bursitis   . Fibromyalgia   . Chronic  pain syndrome   . Trochanteric bursitis   . Pulmonary embolism 01/11/2013  . Anemia, improved 01/11/2013  . History of nuclear stress test 08/07/2011    lexiscan; no evidence of ischemia or infarct; low risk   . Abdominal aortic aneurysm   . Ulcerative colitis   . Obstructive sleep apnea   . Fibromyalgia   . Coronary artery disease   . Hypertension   . Basal cell carcinoma    BP 148/67  Pulse 95  Resp 14  Wt 184 lb (83.462 kg)  SpO2 98%  Opioid Risk Score:   Fall Risk Score: Moderate Fall Risk (6-13 points)   Review of Systems  Psychiatric/Behavioral: Positive for dysphoric mood.  All other systems reviewed and are negative.      Objective:   Physical Exam  Constitutional: She is oriented to person, place, and time. She appears well-developed and well-nourished. She is mildly obese  HENT:  Head: Normocephalic and atraumatic.  Eyes: Conjunctivae are normal. Pupils are equal, round, and reactive to light.    Neck: Normal range of motion. Neck supple.  Cardiovascular: Normal rate and regular rhythm.  Pulmonary/Chest: Effort normal and breath sounds normal.  Abdominal: Soft. Bowel sounds are normal. She exhibits no distension.  Musculoskeletal: She exhibits generalized tenderness (Under right mid trapezius area as well as lower back). She exhibits no edema.  Neurological: She is alert and oriented to person, place, and time.  Skin: Skin is warm and dry.  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 Continue robaxin at nights for spasms  2. Fibromyalgia:  -off gabapentin, effexor not initiated -consider TPI's. Reviewed scapular ROM exercises again  -maintain aquatic program and general exercise. She is doing a great job with this. 3. Bilateral intermittent hand numbness: Carpal tunnel syndrome versus C6 radiculopathy mild  4. LBP as well as with bilateral trochanteric bursitis:  -refilled Norco 5/325mg  # 90 pills--use one pill tid for pain.  5. Follow up with me in about 2 months. A second rx for hydrocodone was given today.

## 2014-05-24 DIAGNOSIS — H2513 Age-related nuclear cataract, bilateral: Secondary | ICD-10-CM | POA: Diagnosis not present

## 2014-05-24 DIAGNOSIS — H43813 Vitreous degeneration, bilateral: Secondary | ICD-10-CM | POA: Diagnosis not present

## 2014-05-25 ENCOUNTER — Telehealth: Payer: Self-pay | Admitting: Internal Medicine

## 2014-05-25 NOTE — Telephone Encounter (Signed)
SPOKE TO PATIENT  SHE STATES SHE WILL GETING LAB DRAWN AT PCP OFFICE -A FEW DAYS PRIOR TO 10/29 APPOINTMENT WITH DR HILTY THE LABS WILL BE AVAILABLE TO DR HILTY- LAB WILL BE DRAWN AT DR Fayrene Helper OFFICE ([facility])

## 2014-05-25 NOTE — Telephone Encounter (Signed)
Pt has appt with Dr Debara Pickett on 06-01-14. She wants to talk to you about getting lab work before her appt.Please call today,if it is possible.

## 2014-06-01 ENCOUNTER — Encounter: Payer: Self-pay | Admitting: Internal Medicine

## 2014-06-01 ENCOUNTER — Ambulatory Visit (INDEPENDENT_AMBULATORY_CARE_PROVIDER_SITE_OTHER): Payer: Medicare Other | Admitting: Internal Medicine

## 2014-06-01 VITALS — BP 148/74 | HR 86 | Ht 64.0 in | Wt 179.9 lb

## 2014-06-01 DIAGNOSIS — I10 Essential (primary) hypertension: Secondary | ICD-10-CM

## 2014-06-01 DIAGNOSIS — I2510 Atherosclerotic heart disease of native coronary artery without angina pectoris: Secondary | ICD-10-CM | POA: Diagnosis not present

## 2014-06-01 DIAGNOSIS — Z9889 Other specified postprocedural states: Secondary | ICD-10-CM

## 2014-06-01 DIAGNOSIS — I2583 Coronary atherosclerosis due to lipid rich plaque: Secondary | ICD-10-CM

## 2014-06-01 DIAGNOSIS — Z23 Encounter for immunization: Secondary | ICD-10-CM

## 2014-06-01 DIAGNOSIS — I739 Peripheral vascular disease, unspecified: Secondary | ICD-10-CM

## 2014-06-01 DIAGNOSIS — R0602 Shortness of breath: Secondary | ICD-10-CM

## 2014-06-01 DIAGNOSIS — I712 Thoracic aortic aneurysm, without rupture: Secondary | ICD-10-CM

## 2014-06-01 DIAGNOSIS — Z8679 Personal history of other diseases of the circulatory system: Secondary | ICD-10-CM | POA: Diagnosis not present

## 2014-06-01 DIAGNOSIS — Z95828 Presence of other vascular implants and grafts: Secondary | ICD-10-CM

## 2014-06-01 DIAGNOSIS — E785 Hyperlipidemia, unspecified: Secondary | ICD-10-CM

## 2014-06-01 DIAGNOSIS — R0789 Other chest pain: Secondary | ICD-10-CM | POA: Diagnosis not present

## 2014-06-01 DIAGNOSIS — I7121 Aneurysm of the ascending aorta, without rupture: Secondary | ICD-10-CM

## 2014-06-01 DIAGNOSIS — G4733 Obstructive sleep apnea (adult) (pediatric): Secondary | ICD-10-CM

## 2014-06-01 NOTE — Progress Notes (Signed)
06/01/2014   PCP: Kara Heck, MD   Chief Complaint  Patient presents with  . Annual Exam    chest pain & SOB with exertion - will be getting labs from PCP next week     Primary Cardiologist:Dr. Alma Bush   HPI: 73 year old white female with a history of coronary artery disease as well as peripheral vascular disease. She had bypass grafting in 1998 and stents in 2005 2006 and 2000 710 negative nuclear stress test in 2013 she also has peripheral arterial disease and history of aortic iliac bypass in 1991. Last ABIs were made of 2013 were normal.  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  Fortunately she was asymptomatic, she was placed on anticoagulation with the Xarelto.   She has done well since that time.  Her last visit with Dr. Debara Bush was in February and he was planning to recheck d-dimer and if it was still elevated she would need to continue her Xarelto for total of 6 months.  Her d-dimer continues to be elevated at 0.67. Initial d-dimer in February was 1.48.  Venous Dopplers were also done in February which were negative for DVT.    Kara Bush just had a repeat D. dimer which was elevated at 1.48, actually slightly higher than it had been before. She tells a story that over the past several weeks she's had a urinary tract infection and started taking Bactrim for which she is more nauseated and is actually thrown up. She reports some tenderness across her epigastric area as well as the costovertebral angles. She's also had fever, chills and some sweating at night. She is planning on seeing her primary care doctor back in the office this afternoon. She denies any worsening shortness of breath or chest pain. He's had no bleeding complications on Xarelto.  Kara Bush returns today for follow-up. She recently reports some shortness of breath with exertion. She  occasionally gets twinges in her chest however it is not reminiscent of her prior angina. Nevertheless her last stress test was 2 years ago. I think a lot of her shortness of breath is due to weight gain and minimal exercise. She does water exercise but not water aerobics.   Allergies  Allergen Reactions  . Atenolol     cholitis  . Bactrim [Sulfamethoxazole-Trimethoprim] Nausea And Vomiting  . Cymbalta [Duloxetine Hcl]   . Lyrica [Pregabalin]   . Penicillins Rash    Current Outpatient Prescriptions  Medication Sig Dispense Refill  . aspirin 81 MG tablet Take 81 mg by mouth daily.       Marland Kitchen b complex vitamins capsule Take 1 capsule by mouth daily.      . chlorhexidine (PERIDEX) 0.12 % solution       . citalopram (CELEXA) 20 MG tablet Take 20 mg by mouth daily.        . Coenzyme Q10 (CO Q-10 PO) Take by mouth daily.      . diclofenac (FLECTOR) 1.3 % PTCH Place 1 patch onto the skin 2 (two) times daily as needed.      Marland Kitchen HYDROcodone-acetaminophen (NORCO/VICODIN) 5-325 MG per tablet TAKE 1 TABLET BY MOUTH THREE TIMES A DAY  90 tablet  0  . lisinopril (PRINIVIL,ZESTRIL) 10 MG tablet Take 5 mg by mouth daily.       . Loteprednol Etabonate (LOTEMAX OP) Apply 1 drop to eye  as needed.      . methocarbamol (ROBAXIN) 500 MG tablet Take 500 mg by mouth 3 (three) times daily as needed.      . metoprolol succinate (TOPROL-XL) 25 MG 24 hr tablet Take 0.5 tablets (12.5 mg total) by mouth daily.  45 tablet  2  . Multiple Vitamin (MULITIVITAMIN WITH MINERALS) TABS Take 1 tablet by mouth daily.        . naproxen sodium (ANAPROX) 220 MG tablet Take 220 mg by mouth 2 (two) times daily with a meal. For pain.      . simvastatin (ZOCOR) 40 MG tablet Take 1 tablet (40 mg total) by mouth at bedtime.  90 tablet  0  . trimethoprim (TRIMPEX) 100 MG tablet       . VOLTAREN 1 % GEL       . ZETIA 10 MG tablet TAKE 1 TABLET BY MOUTH EVERY DAY  90 tablet  0  . zolpidem (AMBIEN) 10 MG tablet Take 5 mg by mouth at bedtime  as needed. For sleep.       No current facility-administered medications for this visit.    Past Medical History  Diagnosis Date  . Hypertension   . CAD (coronary artery disease)     hx CABG 1998, last cath 2007 with PCI and stenting to the proximal segment of the vein graft to the diagonal branch. DES Taxus was placed  . Depression   . Ulcerative colitis   . Hyperlipidemia   . Chronic fatigue fibromyalgia syndrome   . OSA (obstructive sleep apnea)   . PAD (peripheral artery disease)   . S/P resection of aortic aneurysm   . Chest wall pain   . Cervicalgia   . Pes anserine bursitis   . Fibromyalgia   . Chronic pain syndrome   . Trochanteric bursitis   . Pulmonary embolism 01/11/2013  . Anemia, improved 01/11/2013  . History of nuclear stress test 08/07/2011    lexiscan; no evidence of ischemia or infarct; low risk   . Abdominal aortic aneurysm   . Ulcerative colitis   . Obstructive sleep apnea   . Fibromyalgia   . Coronary artery disease   . Hypertension   . Basal cell carcinoma     Past Surgical History  Procedure Laterality Date  . Hernia repair    . Tubal ligation      BTL  . Coronary artery bypass graft  1998    LIMA-LAD;VG-DIAG & RCA  . Coronary stent placement  2005    TO LAD & LCX-STENTS,  . Aortobiexternal iliac bypass  1991  . Coronary angioplasty with stent placement  2006    to LAD, ATRETIC LIMA AT THAT TIME ALSO  . Coronary angioplasty with stent placement  2007    STENT TO PROX VG-DIAG, OTHER STENTS PATENT  . Cholecystectomy  1997    IRJ:JOACZYS:AY colds or fevers, no weight changes Skin:no rashes or ulcers HEENT:no blurred vision, no congestion CV:see HPI PUL:see HPI GI:no diarrhea constipation or melena, no indigestion GU:no hematuria, no dysuria MS:no joint pain, no claudication Neuro:no syncope, no lightheadedness Endo:no diabetes, no thyroid disease  PHYSICAL EXAM BP 148/74  Pulse 86  Ht 5\' 4"  (1.626 m)  Wt 179 lb 14.4 oz (81.602 kg)   BMI 30.86 kg/m2 General:Pleasant affect, NAD Skin:Warm and dry, brisk capillary refill, pale HEENT:normocephalic, sclera clear, mucus membranes moist Neck:supple, no JVD, no bruits  Heart:S1S2 RRR without murmur, gallup, rub or click Lungs:clear without rales, rhonchi, or wheezes TKZ:SWFU,  non tender, + BS, do not palpate liver spleen or masses, +CVA tenderness bilaterally Ext:no lower ext edema, 2+ pedal pulses, 2+ radial pulses Neuro:alert and oriented, MAE, follows commands, + facial symmetry   ASSESSMENT AND PLAN Patient Active Problem List   Diagnosis Date Noted  . Low back pain 11/07/2013  . Hepatitis 04/20/2013  . Screening for STD (sexually transmitted disease) 04/20/2013  . Recurrent UTI 04/20/2013  . Abdominal aortic aneurysm   . Ulcerative colitis   . Obstructive sleep apnea   . Fibromyalgia   . Coronary artery disease   . Basal cell carcinoma   . Anemia, improved 01/11/2013  . Ascending aortic aneurysm, 4.1 cm 01/11/2013  . 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 Chronic periscapular pain 10/15/2011  . Chest wall pain   . S/P resection of aortic aneurysm, 1998 prior to CABG   . PAD (peripheral artery disease), aortic-iliac bypass 1991, mild carotid bruits   . OSA (obstructive sleep apnea)   . Chronic fatigue fibromyalgia syndrome   . Hyperlipidemia   . Ulcerative colitis   . Depression   . CAD (coronary artery disease), CABG 1998, STENTS MULTIPLE SITES SINCE.    Marland Kitchen Hypertension    PLAN: Mrs. Bernick is describing shortness of breath with exertion. I think this is related to weight and deconditioning, however I would recommend an exercise cardiopulmonary stress test. Hopefully if this does not show any significant limitations to her VO2, she could start an exercise program and work on continued weight loss. She is self reportedly "lazy". She also needs to work on improved diet and weight loss. She had been successful with this in the past. Fortunately her last chest CT  shows resolution of her pulmonary emboli. We have taken her off of the Route toe and she seems to be doing well. Blood pressure is also controlled today. I will contact her with the results of her stress test. Finally given her prior history of aortobifem bypass, I would recommend repeat lower extremity arterial Dopplers to reassess for patency of those grafts.  Kara Casino, MD, [facility] Attending Cardiologist [facility]

## 2014-06-01 NOTE — Patient Instructions (Signed)
Your physician has requested that you have a lower extremity arterial duplex. This test is an ultrasound of the arteries in the legs. It looks at arterial blood flow in the legs. Allow one hour for Lower Arterial scans. There are no restrictions or special instructions  Dr Debara Pickett has ordered a cardiometabolic test - this is done @ [facility] wants you to follow-up in: 1 year. You will receive a reminder letter in the mail two months in advance. If you don't receive a letter, please call our office to schedule the follow-up appointment.   What is a Cardiopulmonary Exercise Test (CPET)?   The Cardiopulmonary Exercise Test is a highly sensitive, non-invasive stress test. It is considered a stress test because the exercise stresses your body's systems by making them work faster and harder. A disease or condition that affects the heart, lungs or muscles will limit how much faster and harder these systems can work. A CPET assesses how well the heart, lungs, and muscles are working individually, and how these systems are working in unison. Your heart and lungs work together to deliver oxygen to your muscles, where it is used to make energy, and to remove carbon dioxide from your body.  The full cardiopulmonary system is assessed during a CPET by measuring the amount of oxygen your body is using, the amount of carbon dioxide it is producing, your breathing pattern, and electrocardiogram (EKG) while you are riding a stationary bicycle.  The traditional treadmill stress test only relies on the EKG, which only partially assesses the heart and nothing else. Besides detecting problems in multiple body systems, the CPET is also used to monitor changes in your disease condition, the effect of certain medications on your body, and if medical therapy is improving your condition.  What conditions can be detected/monitored by the Cardiopulmonary ExerciseTest?  Heart, lung, and metabolic conditions  may cause shortness of breath, exercise intolerance or discomfort and pain in the chest. The CPET is the only test that can simultaneously determine which of these systems is causing the problem

## 2014-06-07 DIAGNOSIS — E780 Pure hypercholesterolemia: Secondary | ICD-10-CM | POA: Diagnosis not present

## 2014-06-14 ENCOUNTER — Encounter: Payer: Self-pay | Admitting: Internal Medicine

## 2014-06-16 DIAGNOSIS — H1851 Endothelial corneal dystrophy: Secondary | ICD-10-CM | POA: Diagnosis not present

## 2014-06-22 DIAGNOSIS — K5190 Ulcerative colitis, unspecified, without complications: Secondary | ICD-10-CM | POA: Diagnosis not present

## 2014-06-22 DIAGNOSIS — M461 Sacroiliitis, not elsewhere classified: Secondary | ICD-10-CM | POA: Diagnosis not present

## 2014-06-22 DIAGNOSIS — M797 Fibromyalgia: Secondary | ICD-10-CM | POA: Diagnosis not present

## 2014-06-22 DIAGNOSIS — M76899 Other specified enthesopathies of unspecified lower limb, excluding foot: Secondary | ICD-10-CM | POA: Diagnosis not present

## 2014-06-27 ENCOUNTER — Ambulatory Visit (HOSPITAL_COMMUNITY)
Admission: RE | Admit: 2014-06-27 | Discharge: 2014-06-27 | Disposition: A | Discharge status: Home / Self Care | Payer: Medicare Other | Source: Ambulatory Visit | Attending: Cardiovascular Disease | Admitting: Cardiovascular Disease

## 2014-06-27 DIAGNOSIS — I739 Peripheral vascular disease, unspecified: Secondary | ICD-10-CM | POA: Insufficient documentation

## 2014-06-27 NOTE — Progress Notes (Signed)
Lower Extremity Arterial Duplex Completed. °Brianna L Mazza,RVT °

## 2014-07-03 ENCOUNTER — Other Ambulatory Visit: Payer: Self-pay | Admitting: *Deleted

## 2014-07-03 DIAGNOSIS — I739 Peripheral vascular disease, unspecified: Secondary | ICD-10-CM

## 2014-07-10 ENCOUNTER — Encounter: Payer: Self-pay | Admitting: Physical Medicine & Rehabilitation

## 2014-07-10 ENCOUNTER — Encounter: Payer: Medicare Other | Attending: Physical Medicine and Rehabilitation | Admitting: Physical Medicine & Rehabilitation

## 2014-07-10 VITALS — BP 136/69 | HR 97 | Resp 14 | Ht 64.0 in | Wt 180.0 lb

## 2014-07-10 DIAGNOSIS — I6529 Occlusion and stenosis of unspecified carotid artery: Secondary | ICD-10-CM

## 2014-07-10 DIAGNOSIS — M797 Fibromyalgia: Secondary | ICD-10-CM | POA: Insufficient documentation

## 2014-07-10 DIAGNOSIS — M4302 Spondylolysis, cervical region: Secondary | ICD-10-CM | POA: Insufficient documentation

## 2014-07-10 DIAGNOSIS — R5382 Chronic fatigue, unspecified: Secondary | ICD-10-CM | POA: Insufficient documentation

## 2014-07-10 DIAGNOSIS — Z79899 Other long term (current) drug therapy: Secondary | ICD-10-CM | POA: Insufficient documentation

## 2014-07-10 DIAGNOSIS — Z5181 Encounter for therapeutic drug level monitoring: Secondary | ICD-10-CM | POA: Insufficient documentation

## 2014-07-10 DIAGNOSIS — M545 Low back pain, unspecified: Secondary | ICD-10-CM

## 2014-07-10 MED ORDER — HYDROCODONE-ACETAMINOPHEN 5-325 MG PO TABS: ORAL_TABLET | ORAL | Status: DC

## 2014-07-10 NOTE — Progress Notes (Signed)
Subjective:    Patient ID: Kara Bush, female    DOB: [birth year], 73 y.o.   MRN: [id number]  HPI  Angeli is here in follow up of her chronic pain. For the most part things have remained stable. She is using her hydrocodone typically 3 x per day which is effective in reducing pain so that she can perform her daily activities and exercises.    Pain Inventory Average Pain 4 Pain Right Now 4 My pain is constant and aching  In the last 24 hours, has pain interfered with the following? General activity 3 Relation with others 0 Enjoyment of life 2 What TIME of day is your pain at its worst? morning Sleep (in general) Good  Pain is worse with: bending Pain improves with: rest, pacing activities and medication Relief from Meds: 5  Mobility walk without assistance how many minutes can you walk? 10 ability to climb steps?  yes do you drive?  yes Do you have any goals in this area?  no  Function retired  Neuro/Psych No problems in this area  Prior Studies Any changes since last visit?  no  Physicians involved in your care Any changes since last visit?  no   Family History  Problem Relation Age of Onset  . Heart disease Father   . Heart attack Father     @ 27  . Cancer Paternal Aunt     BREAST  . Heart failure Mother     at 71  . Healthy Sister   . Healthy Brother   . Heart failure Maternal Grandmother   . Heart attack Maternal Grandfather   . Heart attack Paternal Grandmother     at 33   History   Social History  . Marital Status: Divorced    Spouse Name: N/A    Number of Children: 2  . Years of Education: college    Occupational History  .     Social History Main Topics  . Smoking status: Former Smoker    Quit date: 06/29/1990  . Smokeless tobacco: Never Used  . Alcohol Use: No  . Drug Use: No  . Sexual Activity: None   Other Topics Concern  . None   Social History Narrative   Past Surgical History  Procedure Laterality Date  .  Hernia repair    . Tubal ligation      BTL  . Coronary artery bypass graft  1998    LIMA-LAD;VG-DIAG & RCA  . Coronary stent placement  2005    TO LAD & LCX-STENTS,  . Aortobiexternal iliac bypass  1991  . Coronary angioplasty with stent placement  2006    to LAD, ATRETIC LIMA AT THAT TIME ALSO  . Coronary angioplasty with stent placement  2007    STENT TO PROX VG-DIAG, OTHER STENTS PATENT  . Cholecystectomy  1997   Past Medical History  Diagnosis Date  . Hypertension   . CAD (coronary artery disease)     hx CABG 1998, last cath 2007 with PCI and stenting to the proximal segment of the vein graft to the diagonal branch. DES Taxus was placed  . Depression   . Ulcerative colitis   . Hyperlipidemia   . Chronic fatigue fibromyalgia syndrome   . OSA (obstructive sleep apnea)   . PAD (peripheral artery disease)   . S/P resection of aortic aneurysm   . Chest wall pain   . Cervicalgia   . Pes anserine bursitis   . Fibromyalgia   .  Chronic pain syndrome   . Trochanteric bursitis   . Pulmonary embolism 01/11/2013  . Anemia, improved 01/11/2013  . History of nuclear stress test 08/07/2011    lexiscan; no evidence of ischemia or infarct; low risk   . Abdominal aortic aneurysm   . Ulcerative colitis   . Obstructive sleep apnea   . Fibromyalgia   . Coronary artery disease   . Hypertension   . Basal cell carcinoma    BP 136/69 mmHg  Pulse 97  Resp 14  Ht 5\' 4"  (1.626 m)  Wt 180 lb (81.647 kg)  BMI 30.88 kg/m2  SpO2 97%  Opioid Risk Score:   Fall Risk Score: Moderate Fall Risk (6-13 points) (pt has rec'd pamphlet during previous visit)  Review of Systems  All other systems reviewed and are negative.      Objective:   Physical Exam       Constitutional: She is oriented to person, place, and time. She appears well-developed and well-nourished. She is mildly obese   HENT:   Head: Normocephalic and atraumatic.   Eyes: Conjunctivae are normal. Pupils are equal, round, and  reactive to light.   Neck: Normal range of motion. Neck supple.   Cardiovascular: Normal rate and regular rhythm.   Pulmonary/Chest: Effort normal and breath sounds normal.   Abdominal: Soft. Bowel sounds are normal. She exhibits no distension.  Musculoskeletal: She exhibits generalized tenderness ---no edema.  Neurological: She is alert and oriented to person, place, and time.  Skin: Skin is warm and dry.   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  Robaxin at nights for spasms   2. Fibromyalgia:   -off gabapentin, effexor not initiated -consider TPI's. Reviewed scapular ROM exercises again   -maintain aquatic program and general exercise.  . 3. Bilateral intermittent hand numbness: Carpal tunnel syndrome versus C6 radiculopathy mild ---no change. 4. LBP as well as with bilateral trochanteric bursitis:   -refilled Norco 5/325mg  # 90 pills--use one pill tid for pain.  A second rx was provided for next month. 5. Follow up with me in about 2 months. A second rx for hydrocodone was given today.

## 2014-07-10 NOTE — Patient Instructions (Signed)
PLEASE CALL ME WITH ANY PROBLEMS OR QUESTIONS (#[phone]).      

## 2014-07-17 DIAGNOSIS — H2511 Age-related nuclear cataract, right eye: Secondary | ICD-10-CM | POA: Diagnosis not present

## 2014-07-29 ENCOUNTER — Other Ambulatory Visit: Payer: Self-pay | Admitting: Internal Medicine

## 2014-07-31 NOTE — Telephone Encounter (Signed)
Rx(s) sent to pharmacy electronically.  

## 2014-08-10 DIAGNOSIS — H2511 Age-related nuclear cataract, right eye: Secondary | ICD-10-CM | POA: Diagnosis not present

## 2014-08-10 DIAGNOSIS — H25811 Combined forms of age-related cataract, right eye: Secondary | ICD-10-CM | POA: Diagnosis not present

## 2014-08-14 DIAGNOSIS — Z1231 Encounter for screening mammogram for malignant neoplasm of breast: Secondary | ICD-10-CM | POA: Diagnosis not present

## 2014-09-05 ENCOUNTER — Encounter: Payer: Self-pay | Admitting: Physical Medicine & Rehabilitation

## 2014-09-05 ENCOUNTER — Other Ambulatory Visit: Payer: Self-pay | Admitting: Physical Medicine & Rehabilitation

## 2014-09-05 ENCOUNTER — Encounter: Payer: Medicare Other | Attending: Physical Medicine and Rehabilitation | Admitting: Physical Medicine & Rehabilitation

## 2014-09-05 VITALS — BP 130/61 | HR 85 | Resp 14

## 2014-09-05 DIAGNOSIS — R5382 Chronic fatigue, unspecified: Secondary | ICD-10-CM | POA: Insufficient documentation

## 2014-09-05 DIAGNOSIS — G894 Chronic pain syndrome: Secondary | ICD-10-CM

## 2014-09-05 DIAGNOSIS — M4302 Spondylolysis, cervical region: Secondary | ICD-10-CM | POA: Diagnosis not present

## 2014-09-05 DIAGNOSIS — M545 Low back pain, unspecified: Secondary | ICD-10-CM

## 2014-09-05 DIAGNOSIS — M797 Fibromyalgia: Secondary | ICD-10-CM | POA: Insufficient documentation

## 2014-09-05 DIAGNOSIS — Z5181 Encounter for therapeutic drug level monitoring: Secondary | ICD-10-CM | POA: Diagnosis not present

## 2014-09-05 DIAGNOSIS — Z79899 Other long term (current) drug therapy: Secondary | ICD-10-CM | POA: Diagnosis not present

## 2014-09-05 MED ORDER — HYDROCODONE-ACETAMINOPHEN 5-325 MG PO TABS: ORAL_TABLET | ORAL | Status: DC

## 2014-09-05 NOTE — Patient Instructions (Signed)
PLEASE CALL ME WITH ANY PROBLEMS OR QUESTIONS (#[phone]).      

## 2014-09-05 NOTE — Progress Notes (Signed)
Subjective:    Patient ID: Kara Bush, female    DOB: 11-Mar-1941, 74 y.o.   MRN: [id number]  HPI   Kara Bush is back regarding her chronic pain. She has had caratact surgery on one eye and dental surgery. Further cataract surgery later this spring is planned on the other eye.   She continues on her medications as prescribed. SHe keeps up with her exercise program as able. She had to back off a bit due to her eye surgery. Her pain remains in her mid and upper back. She has intermittent numbness in her hands which is usually related to a position she's sitting or lying in.       Pain Inventory Average Pain 4 Pain Right Now 4 My pain is dull and aching  In the last 24 hours, has pain interfered with the following? General activity 2 Relation with others 0 Enjoyment of life 2 What TIME of day is your pain at its worst? daytime Sleep (in general) Good  Pain is worse with: some activites Pain improves with: medication Relief from Meds: 8  Mobility walk without assistance ability to climb steps?  yes do you drive?  yes Do you have any goals in this area?  no  Function retired  Neuro/Psych No problems in this area  Prior Studies Any changes since last visit?  no  Physicians involved in your care Any changes since last visit?  no   Family History  Problem Relation Age of Onset  . Heart disease Father   . Heart attack Father     @ 60  . Cancer Paternal Aunt     BREAST  . Heart failure Mother     at 25  . Healthy Sister   . Healthy Brother   . Heart failure Maternal Grandmother   . Heart attack Maternal Grandfather   . Heart attack Paternal Grandmother     at 73   History   Social History  . Marital Status: Divorced    Spouse Name: N/A    Number of Children: 2  . Years of Education: college    Occupational History  .     Social History Main Topics  . Smoking status: Former Smoker    Quit date: 06/29/1990  . Smokeless tobacco: Never Used  .  Alcohol Use: No  . Drug Use: No  . Sexual Activity: None   Other Topics Concern  . None   Social History Narrative   Past Surgical History  Procedure Laterality Date  . Hernia repair    . Tubal ligation      BTL  . Coronary artery bypass graft  1998    LIMA-LAD;VG-DIAG & RCA  . Coronary stent placement  2005    TO LAD & LCX-STENTS,  . Aortobiexternal iliac bypass  1991  . Coronary angioplasty with stent placement  2006    to LAD, ATRETIC LIMA AT THAT TIME ALSO  . Coronary angioplasty with stent placement  2007    STENT TO PROX VG-DIAG, OTHER STENTS PATENT  . Cholecystectomy  1997   Past Medical History  Diagnosis Date  . Hypertension   . CAD (coronary artery disease)     hx CABG 1998, last cath 2007 with PCI and stenting to the proximal segment of the vein graft to the diagonal branch. DES Taxus was placed  . Depression   . Ulcerative colitis   . Hyperlipidemia   . Chronic fatigue fibromyalgia syndrome   . OSA (obstructive  sleep apnea)   . PAD (peripheral artery disease)   . S/P resection of aortic aneurysm   . Chest wall pain   . Cervicalgia   . Pes anserine bursitis   . Fibromyalgia   . Chronic pain syndrome   . Trochanteric bursitis   . Pulmonary embolism 01/11/2013  . Anemia, improved 01/11/2013  . History of nuclear stress test 08/07/2011    lexiscan; no evidence of ischemia or infarct; low risk   . Abdominal aortic aneurysm   . Ulcerative colitis   . Obstructive sleep apnea   . Fibromyalgia   . Coronary artery disease   . Hypertension   . Basal cell carcinoma    BP 130/61 mmHg  Pulse 85  Resp 14  SpO2 97%  Opioid Risk Score:   Fall Risk Score: Moderate Fall Risk (6-13 points)  Review of Systems  All other systems reviewed and are negative.      Objective:   Physical Exam  Constitutional: She is oriented to person, place, and time. She appears well-developed and well-nourished. She is mildly obese  HENT:  Head: Normocephalic and atraumatic.    Eyes: Conjunctivae are normal. Pupils are equal, round, and reactive to light.  Neck: Normal range of motion. Neck supple.  Cardiovascular: Normal rate and regular rhythm.  Pulmonary/Chest: Effort normal and breath sounds normal.  Abdominal: Soft. Bowel sounds are normal. She exhibits no distension.  Musculoskeletal: She exhibits generalized tenderness ---no edema.  Neurological: She is alert and oriented to person, place, and time.  Skin: Skin is warm and dry.  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 Robaxin at nights for spasms  2. Fibromyalgia:  -fairly well maintained  -maintain aquatic program and general exercise.  -TPI's prn  3. Bilateral intermittent hand numbness: Carpal tunnel syndrome versus C6 radiculopathy mild ---no change---this more than likely a transient positional neuropraxic issue.  4. LBP as well as with bilateral trochanteric bursitis:  -refilled Norco 5/325mg  # 90 pills--use one pill tid for pain. A second rx was provided for next month. She remains consistent with her counts and our program.  5. Follow up with me in about 2 months.  Fifteen minutes of face to face patient care time were spent during this visit. All questions were encouraged and answered.

## 2014-09-06 LAB — PMP ALCOHOL METABOLITE (ETG): Ethyl Glucuronide (EtG): NEGATIVE ng/mL

## 2014-09-08 ENCOUNTER — Ambulatory Visit (HOSPITAL_COMMUNITY): Payer: Medicare Other | Attending: Internal Medicine

## 2014-09-08 DIAGNOSIS — R0602 Shortness of breath: Secondary | ICD-10-CM | POA: Diagnosis not present

## 2014-09-08 DIAGNOSIS — R0789 Other chest pain: Secondary | ICD-10-CM | POA: Diagnosis not present

## 2014-09-08 DIAGNOSIS — Z8679 Personal history of other diseases of the circulatory system: Secondary | ICD-10-CM

## 2014-09-08 LAB — OPIATES/OPIOIDS (LC/MS-MS)
CODEINE URINE: NEGATIVE ng/mL (ref <50)
HYDROCODONE: 2853 ng/mL (ref <50)
Hydromorphone: 688 ng/mL (ref <50)
Morphine Urine: NEGATIVE ng/mL (ref <50)
NOROXYCODONE, UR: NEGATIVE ng/mL (ref <50)
Norhydrocodone, Ur: 1986 ng/mL (ref <50)
OXYCODONE, UR: NEGATIVE ng/mL (ref <50)
Oxymorphone: NEGATIVE ng/mL (ref <50)

## 2014-09-08 LAB — ZOLPIDEM (LC/MS-MS), URINE
ZOLPIDEM METABOLITE (GC/LS/MS) UR, CONFIRM: 357 ng/mL (ref <5)
Zolpidem (GC/LC/MS), Ur confirm: 12 ng/mL (ref <5)

## 2014-09-09 LAB — PRESCRIPTION MONITORING PROFILE (SOLSTAS)
AMPHETAMINE/METH: NEGATIVE ng/mL
Barbiturate Screen, Urine: NEGATIVE ng/mL
Benzodiazepine Screen, Urine: NEGATIVE ng/mL
Buprenorphine, Urine: NEGATIVE ng/mL
CARISOPRODOL, URINE: NEGATIVE ng/mL
COCAINE METABOLITES: NEGATIVE ng/mL
Cannabinoid Scrn, Ur: NEGATIVE ng/mL
Creatinine, Urine: 191.07 mg/dL (ref >20.0)
ECSTASY: NEGATIVE ng/mL
Fentanyl, Ur: NEGATIVE ng/mL
Meperidine, Ur: NEGATIVE ng/mL
Methadone Screen, Urine: NEGATIVE ng/mL
Nitrites, Initial: NEGATIVE ug/mL
Oxycodone Screen, Ur: NEGATIVE ng/mL
PH URINE, INITIAL: 5.9 pH (ref 4.5–8.9)
Propoxyphene: NEGATIVE ng/mL
TAPENTADOLUR: NEGATIVE ng/mL
Tramadol Scrn, Ur: NEGATIVE ng/mL

## 2014-09-12 ENCOUNTER — Telehealth: Payer: Self-pay | Admitting: *Deleted

## 2014-09-12 NOTE — Telephone Encounter (Signed)
Having a bad dental problem and severe pain.  She is not sure of the protocol about medications in this situation.  I called her back and she had an extraction and thinks she can manage now with otc meds and did not require anything extra.  She will call if she does.

## 2014-09-14 NOTE — Telephone Encounter (Signed)
See previous message about Mabry having dental pain 09/12/14. Kara Bush has called back and is still having severe pain and needing to take additional  Pain medication.  What can she take ?

## 2014-09-19 ENCOUNTER — Other Ambulatory Visit (HOSPITAL_COMMUNITY): Payer: Self-pay | Admitting: Internal Medicine

## 2014-09-19 NOTE — Telephone Encounter (Signed)
Rx(s) sent to pharmacy electronically.  

## 2014-09-21 DIAGNOSIS — J0100 Acute maxillary sinusitis, unspecified: Secondary | ICD-10-CM | POA: Diagnosis not present

## 2014-09-27 DIAGNOSIS — G479 Sleep disorder, unspecified: Secondary | ICD-10-CM | POA: Diagnosis not present

## 2014-09-27 DIAGNOSIS — F418 Other specified anxiety disorders: Secondary | ICD-10-CM | POA: Diagnosis not present

## 2014-09-27 DIAGNOSIS — Z79899 Other long term (current) drug therapy: Secondary | ICD-10-CM | POA: Diagnosis not present

## 2014-09-27 DIAGNOSIS — E780 Pure hypercholesterolemia: Secondary | ICD-10-CM | POA: Diagnosis not present

## 2014-09-27 DIAGNOSIS — Z1389 Encounter for screening for other disorder: Secondary | ICD-10-CM | POA: Diagnosis not present

## 2014-09-27 DIAGNOSIS — I10 Essential (primary) hypertension: Secondary | ICD-10-CM | POA: Diagnosis not present

## 2014-09-27 DIAGNOSIS — Z0000 Encounter for general adult medical examination without abnormal findings: Secondary | ICD-10-CM | POA: Diagnosis not present

## 2014-09-27 DIAGNOSIS — J329 Chronic sinusitis, unspecified: Secondary | ICD-10-CM | POA: Diagnosis not present

## 2014-09-27 DIAGNOSIS — D509 Iron deficiency anemia, unspecified: Secondary | ICD-10-CM | POA: Diagnosis not present

## 2014-09-29 NOTE — Progress Notes (Signed)
Urine drug screen for this encounter is consistent for prescribed medication 

## 2014-10-05 ENCOUNTER — Other Ambulatory Visit: Payer: Self-pay | Admitting: Internal Medicine

## 2014-10-05 NOTE — Telephone Encounter (Signed)
Rx(s) sent to pharmacy electronically.  

## 2014-11-02 ENCOUNTER — Telehealth: Payer: Self-pay | Admitting: Internal Medicine

## 2014-11-02 NOTE — Telephone Encounter (Signed)
Forward to Dr Debara Pickett and  Sheral Apley RN

## 2014-11-02 NOTE — Telephone Encounter (Signed)
Stents are not a problem with MRI.  Thanks. Dr. Lemmie Evens

## 2014-11-02 NOTE — Telephone Encounter (Signed)
Information given to patient. She verbalized understanding.

## 2014-11-02 NOTE — Telephone Encounter (Signed)
Pt have 3 stents and wants to know if they will interfere with her having a MRI?

## 2014-11-07 ENCOUNTER — Encounter: Payer: Self-pay | Admitting: Physical Medicine & Rehabilitation

## 2014-11-07 ENCOUNTER — Encounter: Payer: Medicare Other | Attending: Physical Medicine and Rehabilitation | Admitting: Physical Medicine & Rehabilitation

## 2014-11-07 VITALS — BP 140/63 | HR 95 | Resp 14

## 2014-11-07 DIAGNOSIS — M545 Low back pain, unspecified: Secondary | ICD-10-CM

## 2014-11-07 DIAGNOSIS — Z79899 Other long term (current) drug therapy: Secondary | ICD-10-CM | POA: Diagnosis not present

## 2014-11-07 DIAGNOSIS — Z5181 Encounter for therapeutic drug level monitoring: Secondary | ICD-10-CM | POA: Insufficient documentation

## 2014-11-07 DIAGNOSIS — M7060 Trochanteric bursitis, unspecified hip: Secondary | ICD-10-CM | POA: Diagnosis not present

## 2014-11-07 DIAGNOSIS — M797 Fibromyalgia: Secondary | ICD-10-CM | POA: Diagnosis not present

## 2014-11-07 DIAGNOSIS — G9332 Myalgic encephalomyelitis/chronic fatigue syndrome: Secondary | ICD-10-CM

## 2014-11-07 DIAGNOSIS — R5382 Chronic fatigue, unspecified: Secondary | ICD-10-CM | POA: Insufficient documentation

## 2014-11-07 DIAGNOSIS — M4302 Spondylolysis, cervical region: Secondary | ICD-10-CM | POA: Insufficient documentation

## 2014-11-07 DIAGNOSIS — M533 Sacrococcygeal disorders, not elsewhere classified: Secondary | ICD-10-CM | POA: Diagnosis not present

## 2014-11-07 DIAGNOSIS — M7061 Trochanteric bursitis, right hip: Secondary | ICD-10-CM | POA: Insufficient documentation

## 2014-11-07 MED ORDER — DICLOFENAC EPOLAMINE 1.3 % TD PTCH: 1.0000 | MEDICATED_PATCH | Freq: Two times a day (BID) | TRANSDERMAL | Status: DC | PRN

## 2014-11-07 MED ORDER — HYDROCODONE-ACETAMINOPHEN 5-325 MG PO TABS: ORAL_TABLET | ORAL | Status: DC

## 2014-11-07 NOTE — Patient Instructions (Signed)
CONTINUE WITH RANGE OF MOTION AND EXERCISE FOR YOUR LOW BACK   CONSIDER OF AN SI BELT IF LOW BACK PAIN PERSISTS

## 2014-11-07 NOTE — Progress Notes (Signed)
Subjective:    Patient ID: Kara Bush, female    DOB: 12-Aug-1940, 74 y.o.   MRN: [id number]  HPI   Mrs. Henly is here in follow up of her chronic pain. She has had some recent dental work and slipped on the stairs where she bruised her tailbone. Both pain generators are subsiding. She managed with OTC meds in addition to her hydrocodone.      Pain Inventory Average Pain 3 Pain Right Now 3 My pain is aching  In the last 24 hours, has pain interfered with the following? General activity 2 Relation with others 0 Enjoyment of life 1 What TIME of day is your pain at its worst? morning Sleep (in general) Good  Pain is worse with: some activites Pain improves with: rest and medication Relief from Meds: 6  Mobility walk without assistance how many minutes can you walk? 5-10 ability to climb steps?  yes do you drive?  yes Do you have any goals in this area?  no  Function retired Do you have any goals in this area?  no  Neuro/Psych anxiety  Prior Studies Any changes since last visit?  no  Physicians involved in your care Any changes since last visit?  no   Family History  Problem Relation Age of Onset  . Heart disease Father   . Heart attack Father     @ 15  . Cancer Paternal Aunt     BREAST  . Heart failure Mother     at 23  . Healthy Sister   . Healthy Brother   . Heart failure Maternal Grandmother   . Heart attack Maternal Grandfather   . Heart attack Paternal Grandmother     at 52   History   Social History  . Marital Status: Divorced    Spouse Name: N/A  . Number of Children: 2  . Years of Education: college    Occupational History  .     Social History Main Topics  . Smoking status: Former Smoker    Quit date: 06/29/1990  . Smokeless tobacco: Never Used  . Alcohol Use: No  . Drug Use: No  . Sexual Activity: Not on file   Other Topics Concern  . None   Social History Narrative   Past Surgical History  Procedure  Laterality Date  . Hernia repair    . Tubal ligation      BTL  . Coronary artery bypass graft  1998    LIMA-LAD;VG-DIAG & RCA  . Coronary stent placement  2005    TO LAD & LCX-STENTS,  . Aortobiexternal iliac bypass  1991  . Coronary angioplasty with stent placement  2006    to LAD, ATRETIC LIMA AT THAT TIME ALSO  . Coronary angioplasty with stent placement  2007    STENT TO PROX VG-DIAG, OTHER STENTS PATENT  . Cholecystectomy  1997   Past Medical History  Diagnosis Date  . Hypertension   . CAD (coronary artery disease)     hx CABG 1998, last cath 2007 with PCI and stenting to the proximal segment of the vein graft to the diagonal branch. DES Taxus was placed  . Depression   . Ulcerative colitis   . Hyperlipidemia   . Chronic fatigue fibromyalgia syndrome   . OSA (obstructive sleep apnea)   . PAD (peripheral artery disease)   . S/P resection of aortic aneurysm   . Chest wall pain   . Cervicalgia   . Pes anserine  bursitis   . Fibromyalgia   . Chronic pain syndrome   . Trochanteric bursitis   . Pulmonary embolism 01/11/2013  . Anemia, improved 01/11/2013  . History of nuclear stress test 08/07/2011    lexiscan; no evidence of ischemia or infarct; low risk   . Abdominal aortic aneurysm   . Ulcerative colitis   . Obstructive sleep apnea   . Fibromyalgia   . Coronary artery disease   . Hypertension   . Basal cell carcinoma    BP 140/63 mmHg  Pulse 95  Resp 14  SpO2 96%  Opioid Risk Score:   Fall Risk Score:  (patient declined pamphlet during todays visit)`1  Depression screen PHQ 2/9  Depression screen [facility] 2/9 11/07/2014 05/25/2013  Decreased Interest 0 0  Down, Depressed, Hopeless 0 0  PHQ - 2 Score 0 0  Tired, decreased energy 1 -  Change in appetite 1 -  Feeling bad or failure about yourself  0 -  Trouble concentrating 0 -  Moving slowly or fidgety/restless 0 -  Suicidal thoughts 0 -     Review of Systems  Psychiatric/Behavioral: The patient is  nervous/anxious.   All other systems reviewed and are negative.      Objective:   Physical Exam  Constitutional: She is oriented to person, place, and time. She appears well-developed and well-nourished. She is mildly obese  HENT:  Head: Normocephalic and atraumatic.  Eyes: Conjunctivae are normal. Pupils are equal, round, and reactive to light.  Neck: Normal range of motion. Neck supple.  Cardiovascular: Normal rate and regular rhythm.  Pulmonary/Chest: Effort normal and breath sounds normal.  Abdominal: Soft. Bowel sounds are normal. She exhibits no distension.  Musculoskeletal: She exhibits generalized tenderness ---no edema.   -mild pain at right SIJ with palpation.  -both greater trochs are tender Neurological: She is alert and oriented to person, place, and time.  Skin: Skin is warm and dry.  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 Robaxin at nights for spasms  2. Fibromyalgia:  -fairly well maintained  -maintain aquatic program and general exercise as she's doing.   -TPI's prn  3. Bilateral intermittent hand numbness: Carpal tunnel syndrome versus C6 radiculopathy mild ---no change---this more than likely a transient positional neuropraxic issue.  4. LBP as well as with bilateral trochanteric bursitis. May have mild right SIJ irritation -refilled Norco 5/325mg  # 90 pills--use one pill tid for pain. A second rx was provided for next month. -She remains consistent with her counts and our program.  5. Follow up with me in about 2 months. Fifteen minutes of face to face patient care time were spent during this visit. All questions were encouraged and answered.

## 2014-11-22 ENCOUNTER — Telehealth: Payer: Self-pay | Admitting: *Deleted

## 2014-11-22 NOTE — Telephone Encounter (Signed)
Prior authorization for this patients [employer] has been denied

## 2014-12-08 DIAGNOSIS — G479 Sleep disorder, unspecified: Secondary | ICD-10-CM | POA: Diagnosis not present

## 2014-12-08 DIAGNOSIS — R0981 Nasal congestion: Secondary | ICD-10-CM | POA: Diagnosis not present

## 2014-12-21 DIAGNOSIS — M797 Fibromyalgia: Secondary | ICD-10-CM | POA: Diagnosis not present

## 2014-12-21 DIAGNOSIS — M7071 Other bursitis of hip, right hip: Secondary | ICD-10-CM | POA: Diagnosis not present

## 2014-12-21 DIAGNOSIS — G4709 Other insomnia: Secondary | ICD-10-CM | POA: Diagnosis not present

## 2014-12-21 DIAGNOSIS — M62838 Other muscle spasm: Secondary | ICD-10-CM | POA: Diagnosis not present

## 2015-01-02 ENCOUNTER — Ambulatory Visit: Payer: PRIVATE HEALTH INSURANCE | Admitting: Physical Medicine & Rehabilitation

## 2015-01-03 ENCOUNTER — Encounter: Payer: Self-pay | Admitting: Registered Nurse

## 2015-01-03 ENCOUNTER — Encounter: Payer: Medicare Other | Attending: Physical Medicine and Rehabilitation | Admitting: Registered Nurse

## 2015-01-03 ENCOUNTER — Other Ambulatory Visit: Payer: Self-pay | Admitting: Registered Nurse

## 2015-01-03 VITALS — BP 137/54 | HR 94 | Resp 14

## 2015-01-03 DIAGNOSIS — R5382 Chronic fatigue, unspecified: Secondary | ICD-10-CM | POA: Diagnosis not present

## 2015-01-03 DIAGNOSIS — G894 Chronic pain syndrome: Secondary | ICD-10-CM | POA: Diagnosis not present

## 2015-01-03 DIAGNOSIS — M797 Fibromyalgia: Secondary | ICD-10-CM

## 2015-01-03 DIAGNOSIS — M545 Low back pain, unspecified: Secondary | ICD-10-CM

## 2015-01-03 DIAGNOSIS — Z5181 Encounter for therapeutic drug level monitoring: Secondary | ICD-10-CM | POA: Diagnosis not present

## 2015-01-03 DIAGNOSIS — M4302 Spondylolysis, cervical region: Secondary | ICD-10-CM | POA: Diagnosis not present

## 2015-01-03 DIAGNOSIS — Z79899 Other long term (current) drug therapy: Secondary | ICD-10-CM | POA: Diagnosis not present

## 2015-01-03 DIAGNOSIS — G9332 Myalgic encephalomyelitis/chronic fatigue syndrome: Secondary | ICD-10-CM

## 2015-01-03 MED ORDER — HYDROCODONE-ACETAMINOPHEN 5-325 MG PO TABS: ORAL_TABLET | ORAL | Status: DC

## 2015-01-03 MED ORDER — DICLOFENAC EPOLAMINE 1.3 % TD PTCH: 1.0000 | MEDICATED_PATCH | Freq: Two times a day (BID) | TRANSDERMAL | Status: DC | PRN

## 2015-01-03 NOTE — Progress Notes (Signed)
Subjective:    Patient ID: Kara Bush, female    DOB: 01/01/1941, 74 y.o.   MRN: [id number]  HPI: [name] is a 74 year old female who returns for follow up for chronic pain and medication refill. She says her pain is located in her right shoulder, right side, and lower back. She rates her pain 3. Her current exercise regime water exercises at the [facility] three times a week.    Pain Inventory Average Pain 3 Pain Right Now 3 My pain is constant, dull and aching  In the last 24 hours, has pain interfered with the following? General activity 2 Relation with others 0 Enjoyment of life 0 What TIME of day is your pain at its worst? daytime Sleep (in general) Good  Pain is worse with: walking and some activites Pain improves with: medication Relief from Meds: 8  Mobility walk without assistance how many minutes can you walk? 5 ability to climb steps?  yes do you drive?  yes Do you have any goals in this area?  no  Function retired  Neuro/Psych No problems in this area  Prior Studies Any changes since last visit?  no  Physicians involved in your care Any changes since last visit?  no   Family History  Problem Relation Age of Onset  . Heart disease Father   . Heart attack Father     @ 61  . Cancer Paternal Aunt     BREAST  . Heart failure Mother     at 30  . Healthy Sister   . Healthy Brother   . Heart failure Maternal Grandmother   . Heart attack Maternal Grandfather   . Heart attack Paternal Grandmother     at 79   History   Social History  . Marital Status: Divorced    Spouse Name: N/A  . Number of Children: 2  . Years of Education: college    Occupational History  .     Social History Main Topics  . Smoking status: Former Smoker    Quit date: 06/29/1990  . Smokeless tobacco: Never Used  . Alcohol Use: No  . Drug Use: No  . Sexual Activity: Not on file   Other Topics Concern  . None   Social History Narrative   Past  Surgical History  Procedure Laterality Date  . Hernia repair    . Tubal ligation      BTL  . Coronary artery bypass graft  1998    LIMA-LAD;VG-DIAG & RCA  . Coronary stent placement  2005    TO LAD & LCX-STENTS,  . Aortobiexternal iliac bypass  1991  . Coronary angioplasty with stent placement  2006    to LAD, ATRETIC LIMA AT THAT TIME ALSO  . Coronary angioplasty with stent placement  2007    STENT TO PROX VG-DIAG, OTHER STENTS PATENT  . Cholecystectomy  1997   Past Medical History  Diagnosis Date  . Hypertension   . CAD (coronary artery disease)     hx CABG 1998, last cath 2007 with PCI and stenting to the proximal segment of the vein graft to the diagonal branch. DES Taxus was placed  . Depression   . Ulcerative colitis   . Hyperlipidemia   . Chronic fatigue fibromyalgia syndrome   . OSA (obstructive sleep apnea)   . PAD (peripheral artery disease)   . S/P resection of aortic aneurysm   . Chest wall pain   . Cervicalgia   .  Pes anserine bursitis   . Fibromyalgia   . Chronic pain syndrome   . Trochanteric bursitis   . Pulmonary embolism 01/11/2013  . Anemia, improved 01/11/2013  . History of nuclear stress test 08/07/2011    lexiscan; no evidence of ischemia or infarct; low risk   . Abdominal aortic aneurysm   . Ulcerative colitis   . Obstructive sleep apnea   . Fibromyalgia   . Coronary artery disease   . Hypertension   . Basal cell carcinoma    BP 137/54 mmHg  Pulse 94  Resp 14  SpO2 94%  Opioid Risk Score:   Fall Risk Score: Moderate Fall Risk (6-13 points)`1  Depression screen PHQ 2/9  Depression screen [facility] 2/9 11/07/2014 05/25/2013  Decreased Interest 0 0  Down, Depressed, Hopeless 0 0  PHQ - 2 Score 0 0  Tired, decreased energy 1 -  Change in appetite 1 -  Feeling bad or failure about yourself  0 -  Trouble concentrating 0 -  Moving slowly or fidgety/restless 0 -  Suicidal thoughts 0 -     Review of Systems  All other systems reviewed and are  negative.      Objective:   Physical Exam  Constitutional: She is oriented to person, place, and time. She appears well-developed and well-nourished.  HENT:  Head: Normocephalic and atraumatic.  Neck: Normal range of motion. Neck supple.  Cardiovascular: Normal rate and regular rhythm.   Pulmonary/Chest: Effort normal and breath sounds normal.  Musculoskeletal:  Normal Muscle Bulk and Muscle Testing Reveals: Upper Extremities: Full ROM and Muscle Strength 5/5 Thoracic Paraspinal Tenderness: T-1- T-4 (Right Side) Lumbar Paraspinal Tenderness: L-4- L-5 ( Right Side) Lower Extremities: Full ROM and Muscle Strength 5/5 Arises from chair with ease Narrow Based Gait   Neurological: She is alert and oriented to person, place, and time.  Skin: Skin is warm and dry.  Psychiatric: She has a normal mood and affect.  Nursing note and vitals reviewed.         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 2. Fibromyalgia:Continue Pool therapy  And Exercise Regime 3. Bilateral intermittent hand numbness: Carpal tunnel syndrome versus C6 radiculopathy mild: No complaints Today. Continue to Monitor 4. LBP: Refilled: Norco 5/325mg  # 90 pills--use one pill tid for pain. A second rx was given.  20 minutes of face to face patient care time was spent during this visit. All questions were encouraged and answered.

## 2015-01-04 LAB — PMP ALCOHOL METABOLITE (ETG): Ethyl Glucuronide (EtG): NEGATIVE ng/mL

## 2015-01-06 LAB — OPIATES/OPIOIDS (LC/MS-MS)
CODEINE URINE: NEGATIVE ng/mL (ref <50)
HYDROCODONE: 2998 ng/mL (ref <50)
Hydromorphone: 571 ng/mL (ref <50)
MORPHINE: NEGATIVE ng/mL (ref <50)
NORHYDROCODONE, UR: 3606 ng/mL (ref <50)
Noroxycodone, Ur: NEGATIVE ng/mL (ref <50)
OXYCODONE, UR: NEGATIVE ng/mL (ref <50)
OXYMORPHONE, URINE: NEGATIVE ng/mL (ref <50)

## 2015-01-06 LAB — ZOLPIDEM (LC/MS-MS), URINE
ZOLPIDEM METABOLITE (GC/LS/MS) UR, CONFIRM: 190 ng/mL (ref <5)
Zolpidem (GC/LC/MS), Ur confirm: 21 ng/mL (ref <5)

## 2015-01-09 LAB — PRESCRIPTION MONITORING PROFILE (SOLSTAS)
Amphetamine/Meth: NEGATIVE ng/mL
Barbiturate Screen, Urine: NEGATIVE ng/mL
Benzodiazepine Screen, Urine: NEGATIVE ng/mL
Buprenorphine, Urine: NEGATIVE ng/mL
Cannabinoid Scrn, Ur: NEGATIVE ng/mL
Carisoprodol, Urine: NEGATIVE ng/mL
Cocaine Metabolites: NEGATIVE ng/mL
Creatinine, Urine: 181.77 mg/dL (ref >20.0)
ECSTASY: NEGATIVE ng/mL
Fentanyl, Ur: NEGATIVE ng/mL
METHADONE SCREEN, URINE: NEGATIVE ng/mL
Meperidine, Ur: NEGATIVE ng/mL
NITRITES URINE, INITIAL: NEGATIVE ug/mL
Oxycodone Screen, Ur: NEGATIVE ng/mL
Propoxyphene: NEGATIVE ng/mL
TAPENTADOLUR: NEGATIVE ng/mL
TRAMADOL UR: NEGATIVE ng/mL
pH, Initial: 4.9 pH (ref 4.5–8.9)

## 2015-01-17 NOTE — Progress Notes (Signed)
Urine drug screen for this encounter is consistent for prescribed medication 

## 2015-01-30 DIAGNOSIS — J018 Other acute sinusitis: Secondary | ICD-10-CM | POA: Diagnosis not present

## 2015-02-15 DIAGNOSIS — H9202 Otalgia, left ear: Secondary | ICD-10-CM | POA: Diagnosis not present

## 2015-02-16 IMAGING — CT CT ABD-PELV W/ CM
2 of 5 series · 17 of 46 positions shown, 19 images · IV contrast (omnipaque)
Comparison: 08/01/2011

CLINICAL DATA: Recurrent urinary tract infections. Fever of unknown
origin. Bilateral hip pain.

EXAM:
CT ABDOMEN AND PELVIS WITH CONTRAST
TECHNIQUE: Multidetector CT imaging of the abdomen and pelvis was performed
using the standard protocol following bolus administration of
intravenous contrast.
CONTRAST:  100mL OMNIPAQUE IOHEXOL 300 MG/ML  SOLN

[Series 2: abd/ pelvis 5.0 i30f 1 · axial · 0.77mm/px · z∈[-434,-70]mm · 14 of 83 slices shown, 16 images]
[im 5/83  soft-tissue]
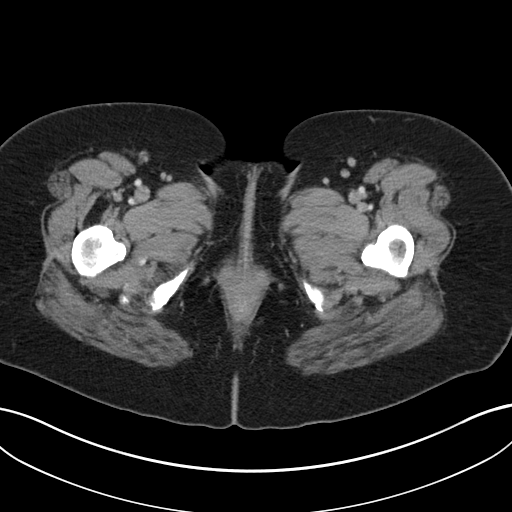
[im 5/83  bone]
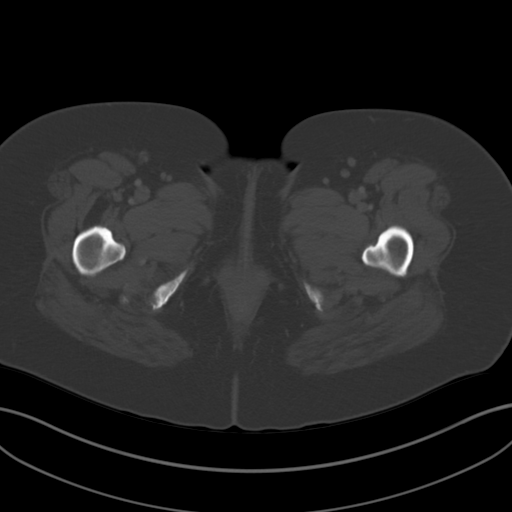
[im 10/83  soft-tissue]
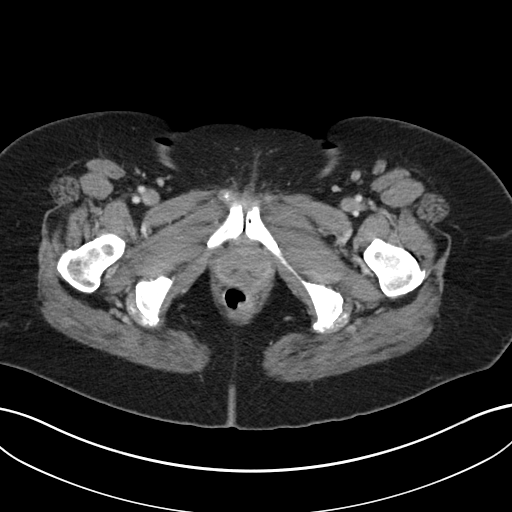
[im 19/83  soft-tissue]
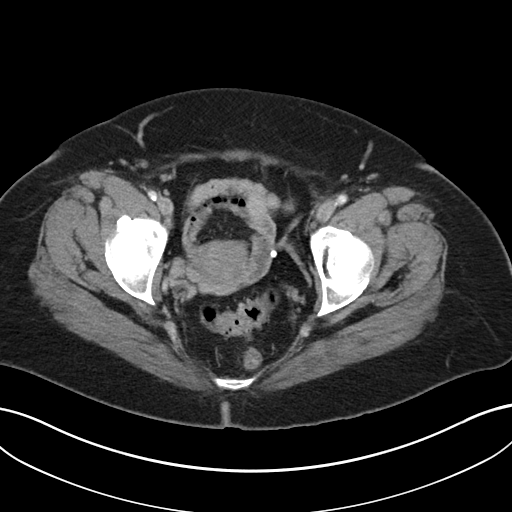
[im 23/83  soft-tissue]
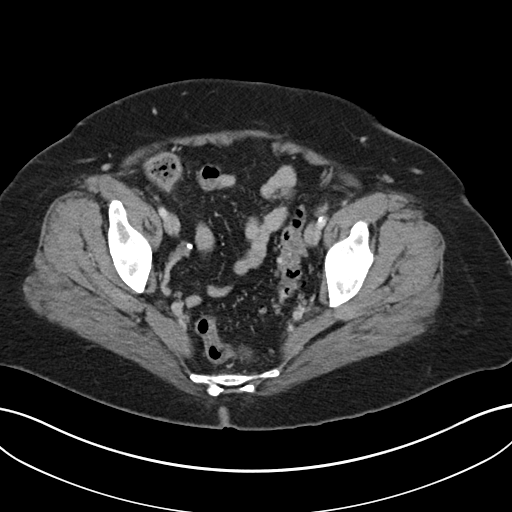
[im 28/83  soft-tissue]
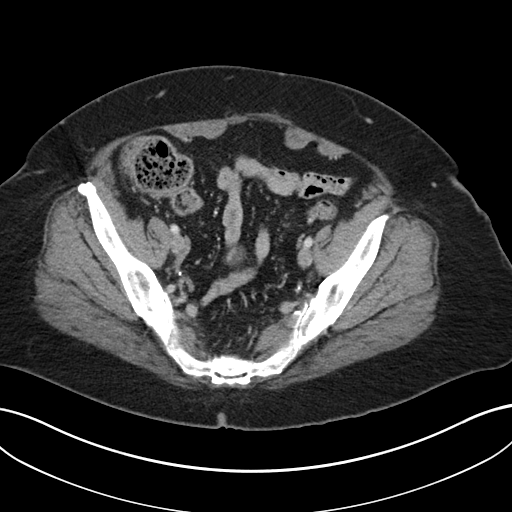
[im 32/83  soft-tissue]
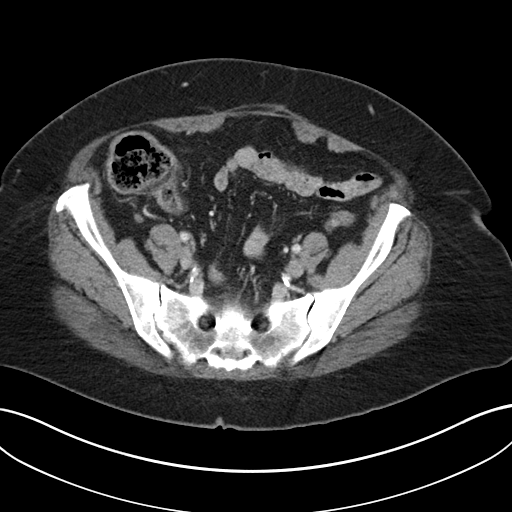
[im 37/83  soft-tissue]
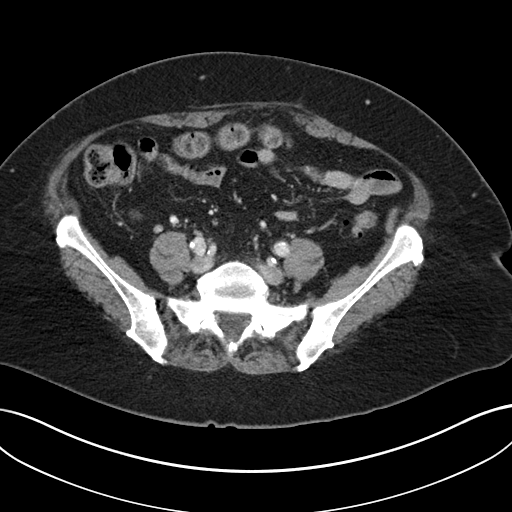
[im 46/83  soft-tissue]
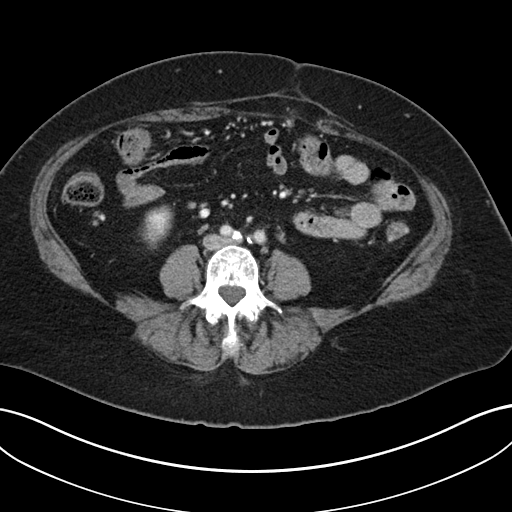
[im 51/83  soft-tissue]
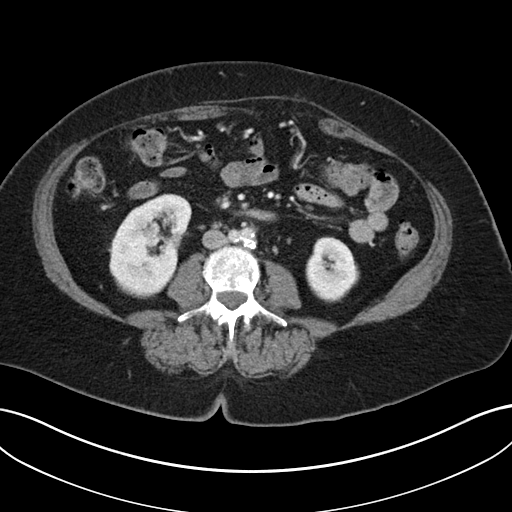
[im 51/83  bone]
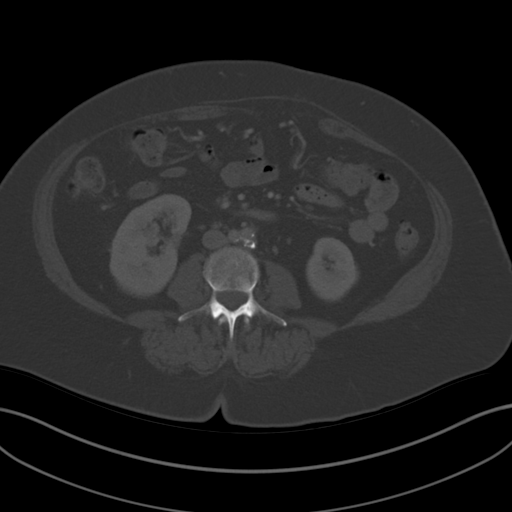
[im 55/83  soft-tissue]
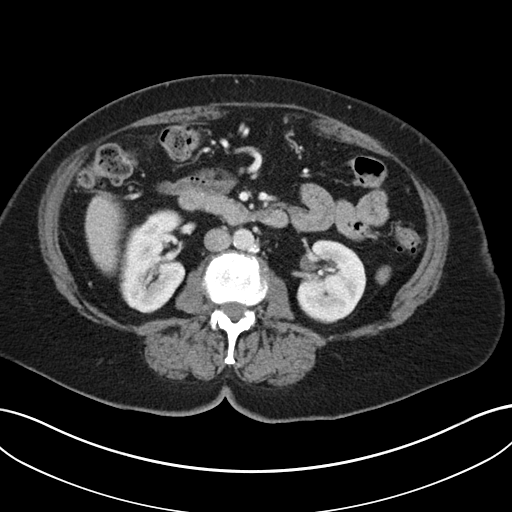
[im 60/83  soft-tissue]
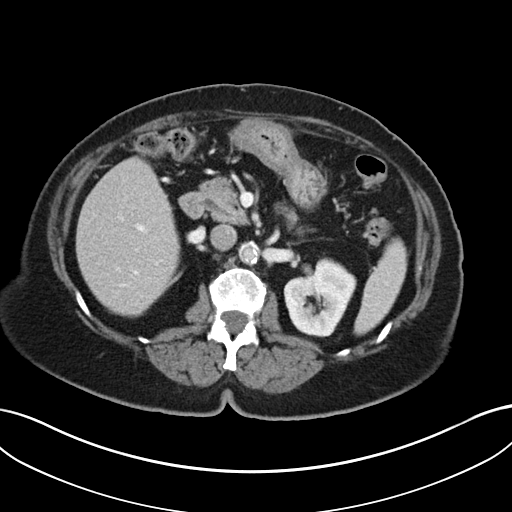
[im 64/83  soft-tissue]
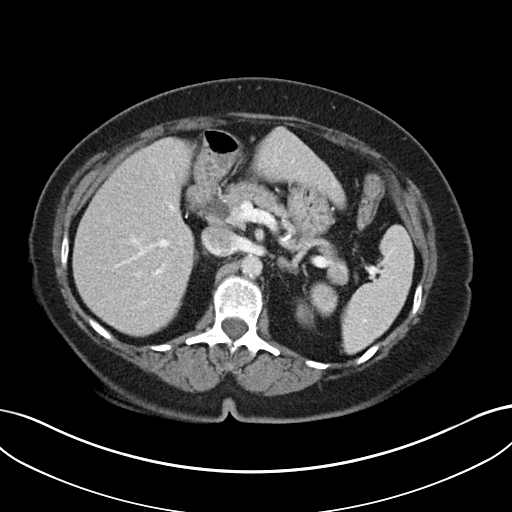
[im 73/83  soft-tissue]
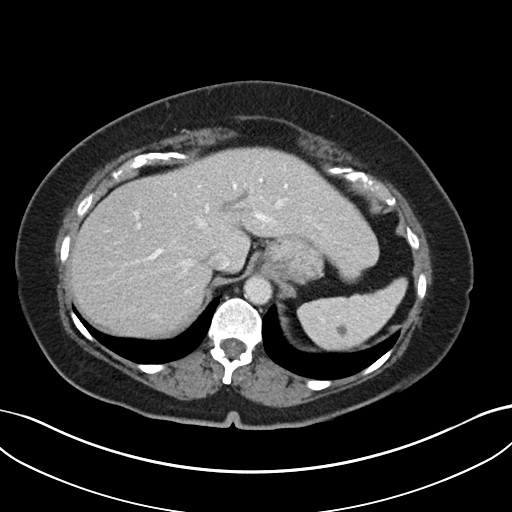
[im 78/83  soft-tissue]
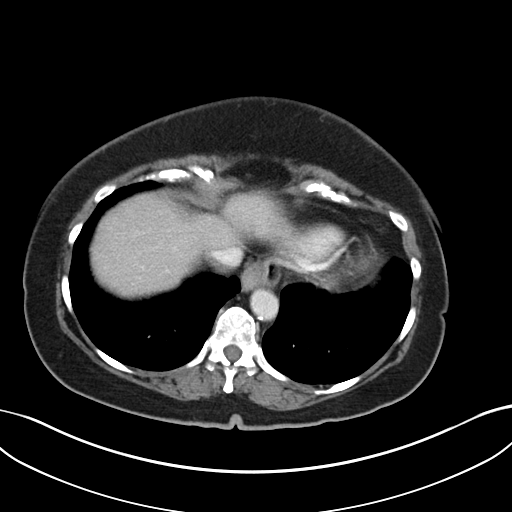

[Series 5: cororal soft tissue · coronal · 0.79mm/px · 3 of 96 slices shown]
[im 32/96  soft-tissue]
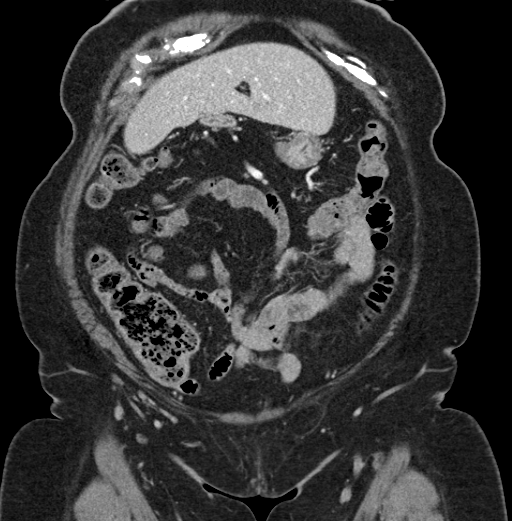
[im 43/96  soft-tissue]
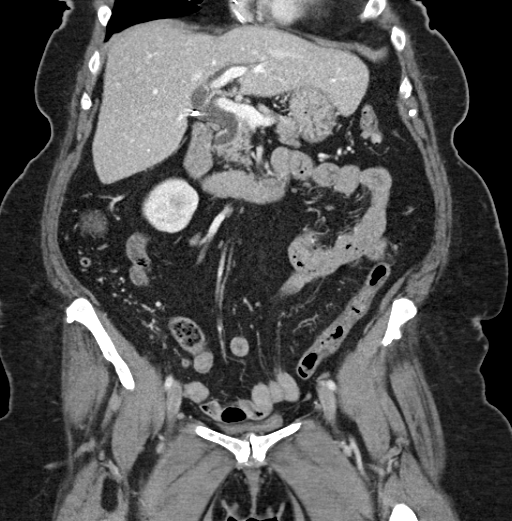
[im 53/96  soft-tissue]
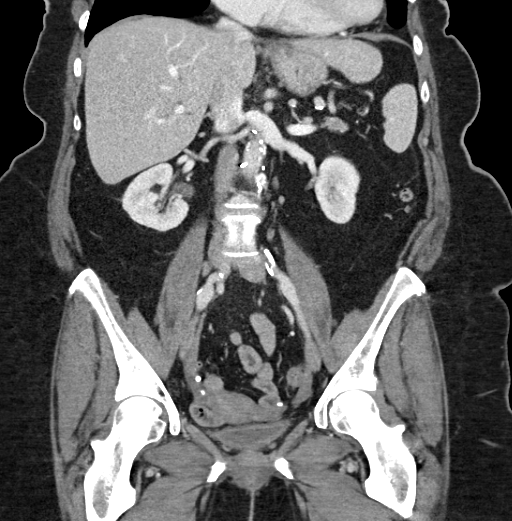

[17 of 46 positions shown; findings below may reference images not displayed]

FINDINGS: No focal abnormality is seen in the liver. There is mild
intrahepatic biliary duct dilatation, unchanged. Extrahepatic common
duct is prominent in the common bile duct measures 7 mm in the
pancreatic head, within normal limits for age. 7 mm low-density
lesion in the dome of the spleen is stable. Poorly defined 2.1 cm
lesion in the inferior spleen is also unchanged. There is a small
hiatal hernia. Stomach is otherwise unremarkable. The duodenum,
pancreas, and adrenal glands are unremarkable. 8 mm low-density
lesion in the lower pole the right kidney is stable and likely
represents a tiny cyst. Left kidney is unremarkable.

No abdominal aortic aneurysm. There is dense atherosclerotic
calcification in the wall the proximal aorta and patient is status
post aorto bi-iliac bypass grafting.

Imaging through the pelvis shows no intraperitoneal free fluid. No
pelvic sidewall lymphadenopathy. There is diffuse diverticulosis of
the left colon without diverticulitis. Terminal ileum is normal. The
appendix is normal.

Uterus is unremarkable. No adnexal mass.

Bone windows reveal no worrisome lytic or sclerotic osseous lesions.
IMPRESSION: Stable. No acute findings in the abdomen or pelvis. Specifically, no
findings to explain the patient's history of fever of unknown
origin.

Status post aorto bi-iliac bypass grafting.

Diverticulosis without diverticulitis.

Small hiatal hernia.

Stable appearance of low density splenic lesions, consistent with a
benign process.

## 2015-02-28 DIAGNOSIS — H01005 Unspecified blepharitis left lower eyelid: Secondary | ICD-10-CM | POA: Diagnosis not present

## 2015-02-28 DIAGNOSIS — H01002 Unspecified blepharitis right lower eyelid: Secondary | ICD-10-CM | POA: Diagnosis not present

## 2015-02-28 DIAGNOSIS — H01001 Unspecified blepharitis right upper eyelid: Secondary | ICD-10-CM | POA: Diagnosis not present

## 2015-02-28 DIAGNOSIS — H26491 Other secondary cataract, right eye: Secondary | ICD-10-CM | POA: Diagnosis not present

## 2015-02-28 DIAGNOSIS — H2512 Age-related nuclear cataract, left eye: Secondary | ICD-10-CM | POA: Diagnosis not present

## 2015-02-28 DIAGNOSIS — H01004 Unspecified blepharitis left upper eyelid: Secondary | ICD-10-CM | POA: Diagnosis not present

## 2015-03-06 ENCOUNTER — Encounter: Payer: Medicare Other | Attending: Physical Medicine and Rehabilitation | Admitting: Physical Medicine & Rehabilitation

## 2015-03-06 ENCOUNTER — Encounter: Payer: Self-pay | Admitting: Physical Medicine & Rehabilitation

## 2015-03-06 ENCOUNTER — Other Ambulatory Visit: Payer: Self-pay | Admitting: *Deleted

## 2015-03-06 VITALS — BP 160/78 | HR 78 | Resp 14

## 2015-03-06 DIAGNOSIS — M545 Low back pain, unspecified: Secondary | ICD-10-CM

## 2015-03-06 DIAGNOSIS — M797 Fibromyalgia: Secondary | ICD-10-CM | POA: Diagnosis not present

## 2015-03-06 DIAGNOSIS — M533 Sacrococcygeal disorders, not elsewhere classified: Secondary | ICD-10-CM | POA: Diagnosis not present

## 2015-03-06 DIAGNOSIS — M7061 Trochanteric bursitis, right hip: Secondary | ICD-10-CM | POA: Diagnosis not present

## 2015-03-06 DIAGNOSIS — Z79899 Other long term (current) drug therapy: Secondary | ICD-10-CM | POA: Insufficient documentation

## 2015-03-06 DIAGNOSIS — M7062 Trochanteric bursitis, left hip: Secondary | ICD-10-CM

## 2015-03-06 DIAGNOSIS — R5382 Chronic fatigue, unspecified: Secondary | ICD-10-CM | POA: Diagnosis not present

## 2015-03-06 DIAGNOSIS — M4302 Spondylolysis, cervical region: Secondary | ICD-10-CM | POA: Diagnosis not present

## 2015-03-06 DIAGNOSIS — Z5181 Encounter for therapeutic drug level monitoring: Secondary | ICD-10-CM | POA: Diagnosis not present

## 2015-03-06 MED ORDER — HYDROCODONE-ACETAMINOPHEN 5-325 MG PO TABS: ORAL_TABLET | ORAL | Status: DC

## 2015-03-06 NOTE — Progress Notes (Signed)
Subjective:    Patient ID: Kara Bush, female    DOB: 03/09/1941, 74 y.o.   MRN: [id number]  HPI   Miral is here in follow up of her chronic pain. She states that things are about the same. She will occasionally take an ibuprofen 200mg  during the middle of the day. She takes a whole hydrocodone in the AM as well as half four more times during the day/evening. The pain is a little worse when she bends or if she's more active in a given day.     Pain Inventory Average Pain 4 Pain Right Now 4 My pain is constant and aching  In the last 24 hours, has pain interfered with the following? General activity 3 Relation with others 0 Enjoyment of life 2 What TIME of day is your pain at its worst? daytime Sleep (in general) Good  Pain is worse with: bending Pain improves with: medication Relief from Meds: 3  Mobility walk without assistance how many minutes can you walk? 30 ability to climb steps?  yes do you drive?  yes  Function retired  Neuro/Psych No problems in this area  Prior Studies Any changes since last visit?  no  Physicians involved in your care Any changes since last visit?  no   Family History  Problem Relation Age of Onset  . Heart disease Father   . Heart attack Father     @ 71  . Cancer Paternal Aunt     BREAST  . Heart failure Mother     at 64  . Healthy Sister   . Healthy Brother   . Heart failure Maternal Grandmother   . Heart attack Maternal Grandfather   . Heart attack Paternal Grandmother     at 76   History   Social History  . Marital Status: Divorced    Spouse Name: N/A  . Number of Children: 2  . Years of Education: college    Occupational History  .     Social History Main Topics  . Smoking status: Former Smoker    Quit date: 06/29/1990  . Smokeless tobacco: Never Used  . Alcohol Use: No  . Drug Use: No  . Sexual Activity: Not on file   Other Topics Concern  . None   Social History Narrative   Past  Surgical History  Procedure Laterality Date  . Hernia repair    . Tubal ligation      BTL  . Coronary artery bypass graft  1998    LIMA-LAD;VG-DIAG & RCA  . Coronary stent placement  2005    TO LAD & LCX-STENTS,  . Aortobiexternal iliac bypass  1991  . Coronary angioplasty with stent placement  2006    to LAD, ATRETIC LIMA AT THAT TIME ALSO  . Coronary angioplasty with stent placement  2007    STENT TO PROX VG-DIAG, OTHER STENTS PATENT  . Cholecystectomy  1997   Past Medical History  Diagnosis Date  . Hypertension   . CAD (coronary artery disease)     hx CABG 1998, last cath 2007 with PCI and stenting to the proximal segment of the vein graft to the diagonal branch. DES Taxus was placed  . Depression   . Ulcerative colitis   . Hyperlipidemia   . Chronic fatigue fibromyalgia syndrome   . OSA (obstructive sleep apnea)   . PAD (peripheral artery disease)   . S/P resection of aortic aneurysm   . Chest wall pain   .  Cervicalgia   . Pes anserine bursitis   . Fibromyalgia   . Chronic pain syndrome   . Trochanteric bursitis   . Pulmonary embolism 01/11/2013  . Anemia, improved 01/11/2013  . History of nuclear stress test 08/07/2011    lexiscan; no evidence of ischemia or infarct; low risk   . Abdominal aortic aneurysm   . Ulcerative colitis   . Obstructive sleep apnea   . Fibromyalgia   . Coronary artery disease   . Hypertension   . Basal cell carcinoma    BP 160/78 mmHg  Pulse 78  Resp 14  SpO2 96%  Opioid Risk Score:   Fall Risk Score:  `1  Depression screen PHQ 2/9  Depression screen [facility] 2/9 11/07/2014 05/25/2013  Decreased Interest 0 0  Down, Depressed, Hopeless 0 0  PHQ - 2 Score 0 0  Tired, decreased energy 1 -  Change in appetite 1 -  Feeling bad or failure about yourself  0 -  Trouble concentrating 0 -  Moving slowly or fidgety/restless 0 -  Suicidal thoughts 0 -     Review of Systems  Constitutional: Negative.   HENT: Negative.   Eyes: Negative.     Respiratory: Negative.   Cardiovascular: Negative.   Gastrointestinal: Negative.   Endocrine: Negative.   Genitourinary: Negative.   Musculoskeletal: Positive for myalgias, back pain and arthralgias.  Allergic/Immunologic: Negative.   Neurological: Negative.   Hematological: Negative.   Psychiatric/Behavioral: Negative.        Objective:   Physical Exam  Constitutional: She is oriented to person, place, and time. She appears well-developed and well-nourished. She is mildly obese  HENT:  Head: Normocephalic and atraumatic.  Eyes: Conjunctivae are normal. Pupils are equal, round, and reactive to light.  Neck: Normal range of motion. Neck supple.  Cardiovascular: Normal rate and regular rhythm.  Pulmonary/Chest: Effort normal and breath sounds normal.  Abdominal: Soft. Bowel sounds are normal. She exhibits no distension.  Musculoskeletal: She exhibits generalized tenderness ---no edema.  -more pain pain at right SIJ with palpation. Compression negative.  -both greater trochs are tender   More pain with extension than flexion. Some pain in right PSIS with right lateral bending. Neurological: She is alert and oriented to person, place, and time.  Skin: Skin is warm and dry.  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 Robaxin at nights for spasms  2. Fibromyalgia:  -fairly well maintained  -maintain aquatic program and general exercise as she's doing. She does a great job with this.   -TPI's prn  3. Bilateral intermittent hand numbness: Carpal tunnel syndrome versus C6 radiculopathy mild ---no change---this more than likely a transient positional neuropraxic issue.  4. LBP as well as with bilateral trochanteric bursitis. May have mild right SIJ  irritation  -refilled Norco 5/325mg  # 90 pills--use one pill tid for pain. A second rx was provided for next month. -She remains consistent with her counts and our program.  -encouraged her to stretch more often. Provided TFL stretches today -may use up to 400mg  ibuprofen during the mid day for further pain relief.  5. Follow up with me in about 2 months. Fifteen minutes of face to face patient care time were spent during this visit. All questions were encouraged and answered.

## 2015-03-13 DIAGNOSIS — H524 Presbyopia: Secondary | ICD-10-CM | POA: Diagnosis not present

## 2015-03-13 DIAGNOSIS — H01002 Unspecified blepharitis right lower eyelid: Secondary | ICD-10-CM | POA: Diagnosis not present

## 2015-03-13 DIAGNOSIS — H01005 Unspecified blepharitis left lower eyelid: Secondary | ICD-10-CM | POA: Diagnosis not present

## 2015-03-13 DIAGNOSIS — H2512 Age-related nuclear cataract, left eye: Secondary | ICD-10-CM | POA: Diagnosis not present

## 2015-03-13 DIAGNOSIS — H01004 Unspecified blepharitis left upper eyelid: Secondary | ICD-10-CM | POA: Diagnosis not present

## 2015-03-13 DIAGNOSIS — H01001 Unspecified blepharitis right upper eyelid: Secondary | ICD-10-CM | POA: Diagnosis not present

## 2015-03-13 DIAGNOSIS — H26491 Other secondary cataract, right eye: Secondary | ICD-10-CM | POA: Diagnosis not present

## 2015-03-22 ENCOUNTER — Telehealth: Payer: Self-pay | Admitting: Internal Medicine

## 2015-03-26 NOTE — Telephone Encounter (Signed)
Close encounter 

## 2015-03-29 DIAGNOSIS — G479 Sleep disorder, unspecified: Secondary | ICD-10-CM | POA: Diagnosis not present

## 2015-03-29 DIAGNOSIS — I10 Essential (primary) hypertension: Secondary | ICD-10-CM | POA: Diagnosis not present

## 2015-03-29 DIAGNOSIS — Z23 Encounter for immunization: Secondary | ICD-10-CM | POA: Diagnosis not present

## 2015-03-29 DIAGNOSIS — E780 Pure hypercholesterolemia: Secondary | ICD-10-CM | POA: Diagnosis not present

## 2015-03-29 DIAGNOSIS — D509 Iron deficiency anemia, unspecified: Secondary | ICD-10-CM | POA: Diagnosis not present

## 2015-04-05 DIAGNOSIS — H524 Presbyopia: Secondary | ICD-10-CM | POA: Diagnosis not present

## 2015-04-05 DIAGNOSIS — H16141 Punctate keratitis, right eye: Secondary | ICD-10-CM | POA: Diagnosis not present

## 2015-04-16 ENCOUNTER — Other Ambulatory Visit: Payer: Self-pay | Admitting: Internal Medicine

## 2015-04-16 NOTE — Telephone Encounter (Signed)
Rx request sent to pharmacy.  

## 2015-05-04 ENCOUNTER — Encounter: Payer: Self-pay | Admitting: Physical Medicine & Rehabilitation

## 2015-05-04 ENCOUNTER — Encounter: Payer: Medicare Other | Attending: Physical Medicine and Rehabilitation | Admitting: Physical Medicine & Rehabilitation

## 2015-05-04 VITALS — BP 136/64 | HR 82 | Resp 14

## 2015-05-04 DIAGNOSIS — H04123 Dry eye syndrome of bilateral lacrimal glands: Secondary | ICD-10-CM | POA: Diagnosis not present

## 2015-05-04 DIAGNOSIS — M4302 Spondylolysis, cervical region: Secondary | ICD-10-CM | POA: Insufficient documentation

## 2015-05-04 DIAGNOSIS — S0502XA Injury of conjunctiva and corneal abrasion without foreign body, left eye, initial encounter: Secondary | ICD-10-CM | POA: Diagnosis not present

## 2015-05-04 DIAGNOSIS — Z5181 Encounter for therapeutic drug level monitoring: Secondary | ICD-10-CM | POA: Diagnosis not present

## 2015-05-04 DIAGNOSIS — M545 Low back pain, unspecified: Secondary | ICD-10-CM

## 2015-05-04 DIAGNOSIS — M7062 Trochanteric bursitis, left hip: Secondary | ICD-10-CM

## 2015-05-04 DIAGNOSIS — M7061 Trochanteric bursitis, right hip: Secondary | ICD-10-CM

## 2015-05-04 DIAGNOSIS — R5382 Chronic fatigue, unspecified: Secondary | ICD-10-CM | POA: Diagnosis not present

## 2015-05-04 DIAGNOSIS — M797 Fibromyalgia: Secondary | ICD-10-CM | POA: Insufficient documentation

## 2015-05-04 DIAGNOSIS — Z79899 Other long term (current) drug therapy: Secondary | ICD-10-CM | POA: Insufficient documentation

## 2015-05-04 MED ORDER — HYDROCODONE-ACETAMINOPHEN 5-325 MG PO TABS: ORAL_TABLET | ORAL | Status: DC

## 2015-05-04 NOTE — Progress Notes (Signed)
Subjective:    Patient ID: Kara Bush, female    DOB: 01/26/1941, 74 y.o.   MRN: [id number]  HPI   Kara Bush is here in follow up of her chronic pain. For the most part her pain is manageable with her current regimen. Her hips seem to have been a little more controlled. She stays active with her stretching and water exercises. She has been doing the TFL stretches as I provided. She does find that her hips "act" up if she walks longer dx. If she stops and stretches, the pain is usually better.   She uses her hydrocodone for baseline pain control. She will take an ibuprofen for breakthrough sx.   Sleep remains solid. Her mood is good.  She suffered a corneal abrasion which has caused her some discomfort. She is seeing a specialist for this.   Pain Inventory Average Pain 5 Pain Right Now 8 My pain is intermittent, constant and aching  In the last 24 hours, has pain interfered with the following? General activity 5 Relation with others 0 Enjoyment of life 0 What TIME of day is your pain at its worst? morning Sleep (in general) Good  Pain is worse with: bending Pain improves with: medication Relief from Meds: 5  Mobility walk without assistance Do you have any goals in this area?  no  Function retired Do you have any goals in this area?  no  Neuro/Psych No problems in this area  Prior Studies Any changes since last visit?  no  Physicians involved in your care Any changes since last visit?  no   Family History  Problem Relation Age of Onset  . Heart disease Father   . Heart attack Father     @ 50  . Cancer Paternal Aunt     BREAST  . Heart failure Mother     at 63  . Healthy Sister   . Healthy Brother   . Heart failure Maternal Grandmother   . Heart attack Maternal Grandfather   . Heart attack Paternal Grandmother     at 41   Social History   Social History  . Marital Status: Divorced    Spouse Name: N/A  . Number of Children: 2  . Years  of Education: college    Occupational History  .     Social History Main Topics  . Smoking status: Former Smoker    Quit date: 06/29/1990  . Smokeless tobacco: Never Used  . Alcohol Use: No  . Drug Use: No  . Sexual Activity: Not Asked   Other Topics Concern  . None   Social History Narrative   Past Surgical History  Procedure Laterality Date  . Hernia repair    . Tubal ligation      BTL  . Coronary artery bypass graft  1998    LIMA-LAD;VG-DIAG & RCA  . Coronary stent placement  2005    TO LAD & LCX-STENTS,  . Aortobiexternal iliac bypass  1991  . Coronary angioplasty with stent placement  2006    to LAD, ATRETIC LIMA AT THAT TIME ALSO  . Coronary angioplasty with stent placement  2007    STENT TO PROX VG-DIAG, OTHER STENTS PATENT  . Cholecystectomy  1997   Past Medical History  Diagnosis Date  . Hypertension   . CAD (coronary artery disease)     hx CABG 1998, last cath 2007 with PCI and stenting to the proximal segment of the vein graft to the diagonal  branch. DES Taxus was placed  . Depression   . Ulcerative colitis   . Hyperlipidemia   . Chronic fatigue fibromyalgia syndrome   . OSA (obstructive sleep apnea)   . PAD (peripheral artery disease)   . S/P resection of aortic aneurysm   . Chest wall pain   . Cervicalgia   . Pes anserine bursitis   . Fibromyalgia   . Chronic pain syndrome   . Trochanteric bursitis   . Pulmonary embolism 01/11/2013  . Anemia, improved 01/11/2013  . History of nuclear stress test 08/07/2011    lexiscan; no evidence of ischemia or infarct; low risk   . Abdominal aortic aneurysm   . Ulcerative colitis   . Obstructive sleep apnea   . Fibromyalgia   . Coronary artery disease   . Hypertension   . Basal cell carcinoma    BP 136/64 mmHg  Pulse 82  Resp 14  SpO2 93%  Opioid Risk Score:   Fall Risk Score:  `1  Depression screen PHQ 2/9  Depression screen [facility] 2/9 11/07/2014 05/25/2013  Decreased Interest 0 0  Down, Depressed,  Hopeless 0 0  PHQ - 2 Score 0 0  Tired, decreased energy 1 -  Change in appetite 1 -  Feeling bad or failure about yourself  0 -  Trouble concentrating 0 -  Moving slowly or fidgety/restless 0 -  Suicidal thoughts 0 -     Review of Systems  All other systems reviewed and are negative.      Objective:   Physical Exam   Constitutional: She is oriented to person, place, and time. She appears well-developed and well-nourished. She is mildly obese  HENT:  Head: Normocephalic and atraumatic.  Eyes: Conjunctivae are normal. Pupils are equal, round, and reactive to light.  Neck: Normal range of motion. Neck supple.  Cardiovascular: Normal rate and regular rhythm.  Pulmonary/Chest: Effort normal and breath sounds normal.  Abdominal: Soft. Bowel sounds are normal. She exhibits no distension.  Musculoskeletal: She exhibits generalized tenderness ---no edema.  -more pain pain at right SIJ with palpation. Compression negative.  -both greater trochs are slightlytender    Neurological: She is alert and oriented to person, place, and time.  Skin: Skin is warm and dry.  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 Fibromyalgia:  -fairly well maintained  -maintain aquatic program and general exercise as she's doing.    -TPI's prn if needed  3. Bilateral intermittent hand numbness: Carpal tunnel syndrome versus C6 radiculopathy mild ---no change--- likely a transient positional neuropraxic issue.  4. LBP as well as with bilateral trochanteric bursitis. May have mild right SIJ irritation  -refilled Norco 5/325mg  # 90 pills--use one pill tid for pain. A second rx was provided for next month. -She remains consistent with her counts and our program.  -encouraged ongoing stretching.  -may use up to 400mg  ibuprofen during the mid day for further pain relief. May use  prior to a "long" day 5. Follow up with me in about 2 months. Fifteen minutes of face to face patient care time were spent during this visit. All questions were encouraged and answered.

## 2015-05-04 NOTE — Patient Instructions (Signed)
PLEASE CALL ME WITH ANY PROBLEMS OR QUESTIONS (#[phone]).  HAVE A GOOD DAY!    

## 2015-05-14 DIAGNOSIS — S0502XA Injury of conjunctiva and corneal abrasion without foreign body, left eye, initial encounter: Secondary | ICD-10-CM | POA: Diagnosis not present

## 2015-05-16 ENCOUNTER — Other Ambulatory Visit: Payer: Self-pay | Admitting: Internal Medicine

## 2015-05-16 NOTE — Telephone Encounter (Signed)
Rx request sent to pharmacy.  

## 2015-06-05 DIAGNOSIS — H04123 Dry eye syndrome of bilateral lacrimal glands: Secondary | ICD-10-CM | POA: Diagnosis not present

## 2015-06-26 ENCOUNTER — Encounter: Payer: Self-pay | Admitting: Physical Medicine & Rehabilitation

## 2015-06-26 ENCOUNTER — Other Ambulatory Visit: Payer: Self-pay | Admitting: Physical Medicine & Rehabilitation

## 2015-06-26 ENCOUNTER — Encounter: Payer: Medicare Other | Attending: Physical Medicine and Rehabilitation | Admitting: Physical Medicine & Rehabilitation

## 2015-06-26 VITALS — BP 148/72 | HR 86

## 2015-06-26 DIAGNOSIS — M4302 Spondylolysis, cervical region: Secondary | ICD-10-CM

## 2015-06-26 DIAGNOSIS — M545 Low back pain, unspecified: Secondary | ICD-10-CM

## 2015-06-26 DIAGNOSIS — R5382 Chronic fatigue, unspecified: Secondary | ICD-10-CM | POA: Diagnosis not present

## 2015-06-26 DIAGNOSIS — Z5181 Encounter for therapeutic drug level monitoring: Secondary | ICD-10-CM | POA: Diagnosis not present

## 2015-06-26 DIAGNOSIS — Z79899 Other long term (current) drug therapy: Secondary | ICD-10-CM | POA: Insufficient documentation

## 2015-06-26 DIAGNOSIS — M797 Fibromyalgia: Secondary | ICD-10-CM | POA: Diagnosis not present

## 2015-06-26 DIAGNOSIS — G894 Chronic pain syndrome: Secondary | ICD-10-CM | POA: Diagnosis not present

## 2015-06-26 MED ORDER — HYDROCODONE-ACETAMINOPHEN 5-325 MG PO TABS: ORAL_TABLET | ORAL | Status: DC

## 2015-06-26 NOTE — Progress Notes (Signed)
Subjective:    Patient ID: Kara Bush, female    DOB: 1941-06-12, 74 y.o.   MRN: [id number]  HPIs Kara Bush is here in follow up of her chronic pain . She has had more pain in her low back after tripping on the stairs and with the cooler weather. She is also struggling with some dental issues. Even though she's a little stiffer right now, she feels her pain is holding fairly steady. She still goes to the pool weekly for her exercises. She tries to walk regularly as well. She has maintenance stretching exercises also.   Pain Inventory Average Pain 4 Pain Right Now 5 My pain is constant and aching  In the last 24 hours, has pain interfered with the following? General activity 4 Relation with others 0 Enjoyment of life 2 What TIME of day is your pain at its worst? varies Sleep (in general) Good  Pain is worse with: n/a Pain improves with: n/a Relief from Meds: n/a  Mobility walk without assistance how many minutes can you walk? 5 ability to climb steps?  yes do you drive?  yes Do you have any goals in this area?  no  Function retired Do you have any goals in this area?  no  Neuro/Psych No problems in this area  Prior Studies Any changes since last visit?  no  Physicians involved in your care Any changes since last visit?  no   Family History  Problem Relation Age of Onset  . Heart disease Father   . Heart attack Father     @ 37  . Cancer Paternal Aunt     BREAST  . Heart failure Mother     at 70  . Healthy Sister   . Healthy Brother   . Heart failure Maternal Grandmother   . Heart attack Maternal Grandfather   . Heart attack Paternal Grandmother     at 88   Social History   Social History  . Marital Status: Divorced    Spouse Name: N/A  . Number of Children: 2  . Years of Education: college    Occupational History  .     Social History Main Topics  . Smoking status: Former Smoker    Quit date: 06/29/1990  . Smokeless tobacco: Never  Used  . Alcohol Use: No  . Drug Use: No  . Sexual Activity: Not Asked   Other Topics Concern  . None   Social History Narrative   Past Surgical History  Procedure Laterality Date  . Hernia repair    . Tubal ligation      BTL  . Coronary artery bypass graft  1998    LIMA-LAD;VG-DIAG & RCA  . Coronary stent placement  2005    TO LAD & LCX-STENTS,  . Aortobiexternal iliac bypass  1991  . Coronary angioplasty with stent placement  2006    to LAD, ATRETIC LIMA AT THAT TIME ALSO  . Coronary angioplasty with stent placement  2007    STENT TO PROX VG-DIAG, OTHER STENTS PATENT  . Cholecystectomy  1997   Past Medical History  Diagnosis Date  . Hypertension   . CAD (coronary artery disease)     hx CABG 1998, last cath 2007 with PCI and stenting to the proximal segment of the vein graft to the diagonal branch. DES Taxus was placed  . Depression   . Ulcerative colitis   . Hyperlipidemia   . Chronic fatigue fibromyalgia syndrome   . OSA (  obstructive sleep apnea)   . PAD (peripheral artery disease) ([place])   . S/P resection of aortic aneurysm   . Chest wall pain   . Cervicalgia   . Pes anserine bursitis   . Fibromyalgia   . Chronic pain syndrome   . Trochanteric bursitis   . Pulmonary embolism ([place]) 01/11/2013  . Anemia, improved 01/11/2013  . History of nuclear stress test 08/07/2011    lexiscan; no evidence of ischemia or infarct; low risk   . Abdominal aortic aneurysm ([place])   . Ulcerative colitis ([place])   . Obstructive sleep apnea   . Fibromyalgia   . Coronary artery disease   . Hypertension   . Basal cell carcinoma    BP 148/72 mmHg  Pulse 86  SpO2 97%  Opioid Risk Score:   Fall Risk Score:  `1  Depression screen PHQ 2/9  Depression screen [facility] 2/9 11/07/2014 05/25/2013  Decreased Interest 0 0  Down, Depressed, Hopeless 0 0  PHQ - 2 Score 0 0  Tired, decreased energy 1 -  Change in appetite 1 -  Feeling bad or failure about yourself  0 -  Trouble concentrating 0 -    Moving slowly or fidgety/restless 0 -  Suicidal thoughts 0 -      Review of Systems  Musculoskeletal: Positive for myalgias and arthralgias.  All other systems reviewed and are negative.      Objective:   Physical Exam  Constitutional: She is oriented to person, place, and time. She appears well-developed and well-nourished. She is mildly obese  HENT:  Head: Normocephalic and atraumatic.  Eyes: Conjunctivae are normal. Pupils are equal, round, and reactive to light.  Neck: Normal range of motion. Neck supple.  Cardiovascular: Normal rate and regular rhythm.  Pulmonary/Chest: Effort normal and breath sounds normal.  Abdominal: Soft. Bowel sounds are normal. She exhibits no distension.  Musculoskeletal: She exhibits generalized tenderness ---no edema.  -has more difficulty with lumbar flexion and tightness in her hamstrings. Posture fairly good in standing.  -both greater trochs are slightlytender  Neurological: She is alert and oriented to person, place, and time.  Skin: Skin is warm and dry.  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 Fibromyalgia:  -fairly well maintained  -maintain aquatic program and general exercise as she's doing.  -daily stretching  3. Bilateral intermittent hand numbness: resolved.  4. LBP as well as with bilateral trochanteric bursitis. Likely facet component also  -refilled Norco 5/325mg  # 90 pills--use one pill tid for pain. A second rx was provided for next month. -She remains consistent with her counts and our program.  -encouraged ongoing stretching especially before exercise -may use up to 400mg  ibuprofen during the mid day for further pain relief.   5. Follow up with me in about 2 months. Fifteen minutes of face to face patient care time were spent during this visit. All questions were encouraged and answered.

## 2015-06-26 NOTE — Patient Instructions (Signed)
PLEASE CALL ME WITH ANY PROBLEMS OR QUESTIONS (#[phone]). HAVE A HAPPY HOLIDAY SEASON!!!    

## 2015-06-27 LAB — PMP ALCOHOL METABOLITE (ETG): Ethyl Glucuronide (EtG): NEGATIVE ng/mL

## 2015-07-01 LAB — OPIATES/OPIOIDS (LC/MS-MS)
CODEINE URINE: NEGATIVE ng/mL (ref <50)
Hydrocodone: 2617 ng/mL (ref <50)
Hydromorphone: 569 ng/mL (ref <50)
Morphine Urine: NEGATIVE ng/mL (ref <50)
NORHYDROCODONE, UR: 2085 ng/mL (ref <50)
NOROXYCODONE, UR: NEGATIVE ng/mL (ref <50)
OXYCODONE, UR: NEGATIVE ng/mL (ref <50)
OXYMORPHONE, URINE: NEGATIVE ng/mL (ref <50)

## 2015-07-01 LAB — ZOLPIDEM (LC/MS-MS), URINE
ZOLPIDEM (GC/LC/MS), UR CONFIRM: 35 ng/mL (ref <5)
Zolpidem metabolite (GC/LC/MS) Ur, confirm: 237 ng/mL (ref <5)

## 2015-07-03 ENCOUNTER — Ambulatory Visit (INDEPENDENT_AMBULATORY_CARE_PROVIDER_SITE_OTHER): Payer: Medicare Other | Admitting: Internal Medicine

## 2015-07-03 ENCOUNTER — Encounter: Payer: Self-pay | Admitting: Internal Medicine

## 2015-07-03 VITALS — BP 140/72 | HR 99 | Ht 64.0 in | Wt 184.9 lb

## 2015-07-03 DIAGNOSIS — I10 Essential (primary) hypertension: Secondary | ICD-10-CM

## 2015-07-03 DIAGNOSIS — I714 Abdominal aortic aneurysm, without rupture, unspecified: Secondary | ICD-10-CM

## 2015-07-03 DIAGNOSIS — I712 Thoracic aortic aneurysm, without rupture: Secondary | ICD-10-CM

## 2015-07-03 DIAGNOSIS — G4733 Obstructive sleep apnea (adult) (pediatric): Secondary | ICD-10-CM | POA: Diagnosis not present

## 2015-07-03 DIAGNOSIS — I7121 Aneurysm of the ascending aorta, without rupture: Secondary | ICD-10-CM

## 2015-07-03 DIAGNOSIS — I2510 Atherosclerotic heart disease of native coronary artery without angina pectoris: Secondary | ICD-10-CM | POA: Diagnosis not present

## 2015-07-03 LAB — PRESCRIPTION MONITORING PROFILE (SOLSTAS)
AMPHETAMINE/METH: NEGATIVE ng/mL
BENZODIAZEPINE SCREEN, URINE: NEGATIVE ng/mL
BUPRENORPHINE, URINE: NEGATIVE ng/mL
Barbiturate Screen, Urine: NEGATIVE ng/mL
CREATININE, URINE: 149.2 mg/dL (ref >20.0)
Cannabinoid Scrn, Ur: NEGATIVE ng/mL
Carisoprodol, Urine: NEGATIVE ng/mL
Cocaine Metabolites: NEGATIVE ng/mL
Fentanyl, Ur: NEGATIVE ng/mL
MDMA URINE: NEGATIVE ng/mL
Meperidine, Ur: NEGATIVE ng/mL
Methadone Screen, Urine: NEGATIVE ng/mL
NITRITES URINE, INITIAL: NEGATIVE ug/mL
Oxycodone Screen, Ur: NEGATIVE ng/mL
PH URINE, INITIAL: 4.8 pH (ref 4.5–8.9)
Propoxyphene: NEGATIVE ng/mL
TRAMADOL UR: NEGATIVE ng/mL
Tapentadol, urine: NEGATIVE ng/mL

## 2015-07-03 NOTE — Patient Instructions (Signed)
Please schedule the lower extremity dopplers!  Dr Debara Pickett recommends that you schedule a follow-up appointment in 1 year. You will receive a reminder letter in the mail two months in advance. If you don't receive a letter, please call our office to schedule the follow-up appointment.  If you need a refill on your cardiac medications before your next appointment, please call your pharmacy.

## 2015-07-04 NOTE — Progress Notes (Signed)
07/04/2015   PCP: Gerrit Heck, MD   Chief Complaint  Patient presents with  . Follow-up    Primary Cardiologist:Dr. Alma Friendly   HPI: 74 year old white female with a history of coronary artery disease as well as peripheral vascular disease. She had bypass grafting in 1998 and stents in 2005 2006 and 2000 710 negative nuclear stress test in 2013 she also has peripheral arterial disease and history of aortic iliac bypass in 1991. Last ABIs were made of 2013 were normal.  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  Fortunately she was asymptomatic, she was placed on anticoagulation with the Xarelto.   She has done well since that time.  Her last visit with Dr. Debara Pickett was in February and he was planning to recheck d-dimer and if it was still elevated she would need to continue her Xarelto for total of 6 months.  Her d-dimer continues to be elevated at 0.67. Initial d-dimer in February was 1.48.  Venous Dopplers were also done in February which were negative for DVT.    Kara Bush just had a repeat D. dimer which was elevated at 1.48, actually slightly higher than it had been before. She tells a story that over the past several weeks she's had a urinary tract infection and started taking Bactrim for which she is more nauseated and is actually thrown up. She reports some tenderness across her epigastric area as well as the costovertebral angles. She's also had fever, chills and some sweating at night. She is planning on seeing her primary care doctor back in the office this afternoon. She denies any worsening shortness of breath or chest pain. He's had no bleeding complications on Xarelto.  Kara Bush returns today for follow-up. She recently reports some shortness of breath with exertion. She occasionally gets twinges in her chest however it is not reminiscent of her prior  angina. Nevertheless her last stress test was 2 years ago. I think a lot of her shortness of breath is due to weight gain and minimal exercise. She does water exercise but not water aerobics.  I saw Kara Bush today in follow-up of her stress test and lower extremity Dopplers. Unfortunately she did not get the lower extremity Dopplers. She did undergo a metabolic stress testing. This showed no circulatory limitation to exercise. The main factors in her shortness of breath seems to be obesity and some pulmonary restriction. Currently she denies any significant leg claudication.    Allergies  Allergen Reactions  . Atenolol Other (See Comments)    cholitis  . Bactrim [Sulfamethoxazole-Trimethoprim] Nausea And Vomiting  . Cymbalta [Duloxetine Hcl] Other (See Comments)    Mad pt feel spacey  . Lyrica [Pregabalin] Other (See Comments)    Made pt feel spacey  . Penicillins Rash    Current Outpatient Prescriptions  Medication Sig Dispense Refill  . aspirin 81 MG tablet Take 81 mg by mouth daily.     Marland Kitchen b complex vitamins capsule Take 1 capsule by mouth daily.    . chlorhexidine (PERIDEX) 0.12 % solution     . Cholecalciferol (VITAMIN D-3) 1000 UNITS CAPS Take 1 capsule by mouth daily.    . citalopram (CELEXA) 20 MG tablet Take 20 mg by mouth daily.      Marland Kitchen HYDROcodone-acetaminophen (NORCO/VICODIN) 5-325 MG tablet TAKE 1 TABLET BY MOUTH THREE TIMES A DAY 90 tablet  0  . Lifitegrast (XIIDRA) 5 % SOLN Apply to eye.    Marland Kitchen lisinopril (PRINIVIL,ZESTRIL) 5 MG tablet Take 1 tablet (5 mg total) by mouth daily. 90 tablet 2  . Magnesium Hydroxide (MAGNESIA PO) Take 1 tablet by mouth daily.    . methocarbamol (ROBAXIN) 500 MG tablet Take 500 mg by mouth 3 (three) times daily as needed.    . metoprolol succinate (TOPROL-XL) 25 MG 24 hr tablet TAKE 0.5 TABLETS (12.5 MG TOTAL) BY MOUTH DAILY. 45 tablet 2  . Multiple Vitamin (MULITIVITAMIN WITH MINERALS) TABS Take 1 tablet by mouth daily.      . naproxen  sodium (ANAPROX) 220 MG tablet Take 220 mg by mouth 2 (two) times daily with a meal. For pain.    . simvastatin (ZOCOR) 40 MG tablet Take 1 tablet (40 mg total) by mouth daily at 6 PM. <KEEP APPOINTMENT> 90 tablet 0  . vitamin C (ASCORBIC ACID) 500 MG tablet Take 500 mg by mouth daily.    Marland Kitchen ZETIA 10 MG tablet TAKE 1 TABLET BY MOUTH EVERY DAY 90 tablet 0  . zolpidem (AMBIEN) 10 MG tablet Take 5 mg by mouth at bedtime as needed. For sleep.     No current facility-administered medications for this visit.    Past Medical History  Diagnosis Date  . Hypertension   . CAD (coronary artery disease)     hx CABG 1998, last cath 2007 with PCI and stenting to the proximal segment of the vein graft to the diagonal branch. DES Taxus was placed  . Depression   . Ulcerative colitis   . Hyperlipidemia   . Chronic fatigue fibromyalgia syndrome   . OSA (obstructive sleep apnea)   . PAD (peripheral artery disease) ([place])   . S/P resection of aortic aneurysm   . Chest wall pain   . Cervicalgia   . Pes anserine bursitis   . Fibromyalgia   . Chronic pain syndrome   . Trochanteric bursitis   . Pulmonary embolism ([place]) 01/11/2013  . Anemia, improved 01/11/2013  . History of nuclear stress test 08/07/2011    lexiscan; no evidence of ischemia or infarct; low risk   . Abdominal aortic aneurysm ([place])   . Ulcerative colitis ([place])   . Obstructive sleep apnea   . Fibromyalgia   . Coronary artery disease   . Hypertension   . Basal cell carcinoma     Past Surgical History  Procedure Laterality Date  . Hernia repair    . Tubal ligation      BTL  . Coronary artery bypass graft  1998    LIMA-LAD;VG-DIAG & RCA  . Coronary stent placement  2005    TO LAD & LCX-STENTS,  . Aortobiexternal iliac bypass  1991  . Coronary angioplasty with stent placement  2006    to LAD, ATRETIC LIMA AT THAT TIME ALSO  . Coronary angioplasty with stent placement  2007    STENT TO PROX VG-DIAG, OTHER STENTS PATENT  .  Cholecystectomy  1997    [id number] colds or fevers, no weight changes Skin:no rashes or ulcers HEENT:no blurred vision, no congestion CV:see HPI PUL:see HPI GI:no diarrhea constipation or melena, no indigestion GU:no hematuria, no dysuria MS:no joint pain, no claudication Neuro:no syncope, no lightheadedness Endo:no diabetes, no thyroid disease  PHYSICAL EXAM BP 140/72 mmHg  Pulse 99  Ht 5\' 4"  (1.626 m)  Wt 184 lb 14.4 oz (83.87 kg)  BMI 31.72 kg/m2 Deferred   ASSESSMENT AND PLAN Patient Active  Problem List   Diagnosis Date Noted  . Trochanteric bursitis 11/07/2014  . Sacro-iliac pain 11/07/2014  . Low back pain 11/07/2013  . Hepatitis 04/20/2013  . Screening for STD (sexually transmitted disease) 04/20/2013  . Recurrent UTI 04/20/2013  . Abdominal aortic aneurysm ([place])   . Ulcerative colitis ([place])   . Obstructive sleep apnea   . Fibromyalgia   . Coronary artery disease   . Basal cell carcinoma   . Anemia, improved 01/11/2013  . Ascending aortic aneurysm, 4.1 cm 01/11/2013  . 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 Chronic periscapular pain 10/15/2011  . Chest wall pain   . S/P resection of aortic aneurysm, 1998 prior to CABG   . PAD (peripheral artery disease), aortic-iliac bypass 1991, mild carotid bruits   . OSA (obstructive sleep apnea)   . Chronic fatigue fibromyalgia syndrome   . Hyperlipidemia   . Ulcerative colitis   . Depression   . CAD (coronary artery disease), CABG 1998, STENTS MULTIPLE SITES SINCE.    Marland Kitchen Hypertension    PLAN: Kara Bush had no significant circulatory limitation that would be a cause of her shortness of breath. The main issues are obesity and some restrictive ventilatory defects. This should improve with continued exercise and weight loss. She's not complaining of claudication although does have prior aorto-iliac bypass. She is overdue for repeat lower extremity arterial Dopplers. We'll go ahead and schedule those today. Plan to  see her back in 1 year.  Pixie Casino, MD, [facility] Attending Cardiologist [place]

## 2015-07-06 ENCOUNTER — Other Ambulatory Visit: Payer: Self-pay | Admitting: Internal Medicine

## 2015-07-06 DIAGNOSIS — J069 Acute upper respiratory infection, unspecified: Secondary | ICD-10-CM | POA: Diagnosis not present

## 2015-07-06 DIAGNOSIS — J029 Acute pharyngitis, unspecified: Secondary | ICD-10-CM | POA: Diagnosis not present

## 2015-07-06 DIAGNOSIS — I739 Peripheral vascular disease, unspecified: Secondary | ICD-10-CM

## 2015-07-16 DIAGNOSIS — H2512 Age-related nuclear cataract, left eye: Secondary | ICD-10-CM | POA: Diagnosis not present

## 2015-07-16 DIAGNOSIS — H04123 Dry eye syndrome of bilateral lacrimal glands: Secondary | ICD-10-CM | POA: Diagnosis not present

## 2015-07-22 ENCOUNTER — Other Ambulatory Visit: Payer: Self-pay | Admitting: Internal Medicine

## 2015-07-24 NOTE — Telephone Encounter (Signed)
Rx(s) sent to pharmacy electronically.  

## 2015-08-01 ENCOUNTER — Ambulatory Visit (HOSPITAL_COMMUNITY)
Admission: RE | Admit: 2015-08-01 | Discharge: 2015-08-01 | Disposition: A | Discharge status: Home / Self Care | Payer: Medicare Other | Source: Ambulatory Visit | Attending: Internal Medicine | Admitting: Internal Medicine

## 2015-08-01 ENCOUNTER — Ambulatory Visit (HOSPITAL_COMMUNITY)
Admission: RE | Admit: 2015-08-01 | Discharge: 2015-08-01 | Disposition: A | Discharge status: Home / Self Care | Payer: Medicare Other | Source: Ambulatory Visit | Attending: Cardiology | Admitting: Cardiology

## 2015-08-01 DIAGNOSIS — Q251 Coarctation of aorta: Secondary | ICD-10-CM | POA: Diagnosis not present

## 2015-08-01 DIAGNOSIS — I739 Peripheral vascular disease, unspecified: Secondary | ICD-10-CM | POA: Diagnosis not present

## 2015-08-01 DIAGNOSIS — E7800 Pure hypercholesterolemia, unspecified: Secondary | ICD-10-CM | POA: Diagnosis not present

## 2015-08-01 DIAGNOSIS — I708 Atherosclerosis of other arteries: Secondary | ICD-10-CM | POA: Insufficient documentation

## 2015-08-01 DIAGNOSIS — I10 Essential (primary) hypertension: Secondary | ICD-10-CM | POA: Diagnosis not present

## 2015-08-01 DIAGNOSIS — Z23 Encounter for immunization: Secondary | ICD-10-CM | POA: Diagnosis not present

## 2015-08-01 DIAGNOSIS — D509 Iron deficiency anemia, unspecified: Secondary | ICD-10-CM | POA: Diagnosis not present

## 2015-08-01 DIAGNOSIS — E785 Hyperlipidemia, unspecified: Secondary | ICD-10-CM | POA: Insufficient documentation

## 2015-08-01 DIAGNOSIS — I700 Atherosclerosis of aorta: Secondary | ICD-10-CM | POA: Diagnosis not present

## 2015-08-02 ENCOUNTER — Other Ambulatory Visit (HOSPITAL_COMMUNITY): Payer: Self-pay | Admitting: Internal Medicine

## 2015-08-02 NOTE — Telephone Encounter (Signed)
Rx has been sent to the pharmacy electronically. ° °

## 2015-08-08 DIAGNOSIS — F419 Anxiety disorder, unspecified: Secondary | ICD-10-CM | POA: Diagnosis not present

## 2015-08-14 DIAGNOSIS — F5102 Adjustment insomnia: Secondary | ICD-10-CM | POA: Diagnosis not present

## 2015-08-14 DIAGNOSIS — M542 Cervicalgia: Secondary | ICD-10-CM | POA: Diagnosis not present

## 2015-08-14 DIAGNOSIS — R5383 Other fatigue: Secondary | ICD-10-CM | POA: Diagnosis not present

## 2015-08-14 DIAGNOSIS — M797 Fibromyalgia: Secondary | ICD-10-CM | POA: Diagnosis not present

## 2015-08-21 ENCOUNTER — Encounter: Payer: Self-pay | Admitting: Physical Medicine & Rehabilitation

## 2015-08-21 ENCOUNTER — Encounter: Payer: Medicare Other | Attending: Physical Medicine and Rehabilitation | Admitting: Physical Medicine & Rehabilitation

## 2015-08-21 VITALS — BP 146/75 | HR 54

## 2015-08-21 DIAGNOSIS — M545 Low back pain, unspecified: Secondary | ICD-10-CM

## 2015-08-21 DIAGNOSIS — Z5181 Encounter for therapeutic drug level monitoring: Secondary | ICD-10-CM | POA: Insufficient documentation

## 2015-08-21 DIAGNOSIS — Z79899 Other long term (current) drug therapy: Secondary | ICD-10-CM | POA: Diagnosis not present

## 2015-08-21 DIAGNOSIS — R5382 Chronic fatigue, unspecified: Secondary | ICD-10-CM

## 2015-08-21 DIAGNOSIS — M4302 Spondylolysis, cervical region: Secondary | ICD-10-CM | POA: Diagnosis not present

## 2015-08-21 DIAGNOSIS — M797 Fibromyalgia: Secondary | ICD-10-CM

## 2015-08-21 MED ORDER — HYDROCODONE-ACETAMINOPHEN 5-325 MG PO TABS: ORAL_TABLET | ORAL | Status: DC

## 2015-08-21 NOTE — Progress Notes (Signed)
Subjective:    Patient ID: Kara Bush, female    DOB: 08-Jan-1941, 75 y.o.   MRN: [id number]  HPI   Kara Bush is back for her pain mgt. She has been doing well since I last saw her. She misses a little time at the pool because of the holidays and pool repairs.  She's gotten back to it now and feels good.  She sometimes is limited with her household activities but not in a major way. If she needs help she asks for it.  Pain Inventory Average Pain 4 Pain Right Now 4 My pain is aching  In the last 24 hours, has pain interfered with the following? General activity 4 Relation with others 0 Enjoyment of life 1 What TIME of day is your pain at its worst? n/a Sleep (in general) Good  Pain is worse with: bending and some activites Pain improves with: pacing activities and medication Relief from Meds: 6  Mobility walk without assistance how many minutes can you walk? 5 ability to climb steps?  yes do you drive?  yes  Function retired  Neuro/Psych No problems in this area  Prior Studies Any changes since last visit?  no  Physicians involved in your care Any changes since last visit?  no   Family History  Problem Relation Age of Onset  . Heart disease Father   . Heart attack Father     @ 82  . Cancer Paternal Aunt     BREAST  . Heart failure Mother     at 41  . Healthy Sister   . Healthy Brother   . Heart failure Maternal Grandmother   . Heart attack Maternal Grandfather   . Heart attack Paternal Grandmother     at 21   Social History   Social History  . Marital Status: Divorced    Spouse Name: N/A  . Number of Children: 2  . Years of Education: college    Occupational History  .     Social History Main Topics  . Smoking status: Former Smoker    Quit date: 06/29/1990  . Smokeless tobacco: Never Used  . Alcohol Use: No  . Drug Use: No  . Sexual Activity: Not Asked   Other Topics Concern  . None   Social History Narrative   Past Surgical  History  Procedure Laterality Date  . Hernia repair    . Tubal ligation      BTL  . Coronary artery bypass graft  1998    LIMA-LAD;VG-DIAG & RCA  . Coronary stent placement  2005    TO LAD & LCX-STENTS,  . Aortobiexternal iliac bypass  1991  . Coronary angioplasty with stent placement  2006    to LAD, ATRETIC LIMA AT THAT TIME ALSO  . Coronary angioplasty with stent placement  2007    STENT TO PROX VG-DIAG, OTHER STENTS PATENT  . Cholecystectomy  1997   Past Medical History  Diagnosis Date  . Hypertension   . CAD (coronary artery disease)     hx CABG 1998, last cath 2007 with PCI and stenting to the proximal segment of the vein graft to the diagonal branch. DES Taxus was placed  . Depression   . Ulcerative colitis   . Hyperlipidemia   . Chronic fatigue fibromyalgia syndrome   . OSA (obstructive sleep apnea)   . PAD (peripheral artery disease) ([place])   . S/P resection of aortic aneurysm   . Chest wall pain   .  Cervicalgia   . Pes anserine bursitis   . Fibromyalgia   . Chronic pain syndrome   . Trochanteric bursitis   . Pulmonary embolism ([place]) 01/11/2013  . Anemia, improved 01/11/2013  . History of nuclear stress test 08/07/2011    lexiscan; no evidence of ischemia or infarct; low risk   . Abdominal aortic aneurysm ([place])   . Ulcerative colitis ([place])   . Obstructive sleep apnea   . Fibromyalgia   . Coronary artery disease   . Hypertension   . Basal cell carcinoma    BP 146/75 mmHg  Pulse 54  SpO2 96%  Opioid Risk Score:   Fall Risk Score:  `1  Depression screen PHQ 2/9  Depression screen [facility] 2/9 08/21/2015 11/07/2014 05/25/2013  Decreased Interest 0 0 0  Down, Depressed, Hopeless 0 0 0  PHQ - 2 Score 0 0 0  Tired, decreased energy - 1 -  Change in appetite - 1 -  Feeling bad or failure about yourself  - 0 -  Trouble concentrating - 0 -  Moving slowly or fidgety/restless - 0 -  Suicidal thoughts - 0 -     Review of Systems  All other systems reviewed and are  negative.      Objective:   Physical Exam Constitutional: She is oriented to person, place, and time. She appears well-developed and well-nourished. She is mildly obese  HENT:  Head: Normocephalic and atraumatic.  Eyes: Conjunctivae are normal. Pupils are equal, round, and reactive to light.  Neck: Normal range of motion. Neck supple.  Cardiovascular: Normal rate and regular rhythm.  Pulmonary/Chest: Effort normal and breath sounds normal.  Abdominal: Soft. Bowel sounds are normal. She exhibits no distension.  Musculoskeletal: She exhibits generalized tenderness ---no edema.  -has more difficulty with lumbar flexion and tightness in her hamstrings. Posture fairly good in standing.  -both greater trochs are slightlytender  Neurological: She is alert and oriented to person, place, and time.  Skin: Skin is warm and dry.  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 Fibromyalgia:  -fairly well maintained  -maintain aquatic program and general exercise.  -daily stretching  3. Bilateral intermittent hand numbness: resolved.  4. LBP as well as with bilateral trochanteric bursitis. Likely facet component also  -refilled Norco 5/325mg  # 90 pills--use one pill tid for pain. A second rx was provided for next month. -She remains consistent with her counts and our program.  -may use up to 400mg  ibuprofen during the mid day for further pain relief.  5. Follow up with me in about 2 months. ten minutes of face to face patient care time were spent during this visit. All questions were encouraged and answered.

## 2015-08-21 NOTE — Patient Instructions (Signed)
  PLEASE CALL ME WITH ANY PROBLEMS OR QUESTIONS (#[phone]).      

## 2015-08-22 ENCOUNTER — Other Ambulatory Visit: Payer: Self-pay | Admitting: Internal Medicine

## 2015-08-22 NOTE — Telephone Encounter (Signed)
Rx request sent to pharmacy.  

## 2015-09-08 ENCOUNTER — Other Ambulatory Visit: Payer: Self-pay | Admitting: Internal Medicine

## 2015-09-10 NOTE — Telephone Encounter (Signed)
Rx(s) sent to pharmacy electronically.  

## 2015-09-14 DIAGNOSIS — Z803 Family history of malignant neoplasm of breast: Secondary | ICD-10-CM | POA: Diagnosis not present

## 2015-09-14 DIAGNOSIS — Z1231 Encounter for screening mammogram for malignant neoplasm of breast: Secondary | ICD-10-CM | POA: Diagnosis not present

## 2015-09-15 DIAGNOSIS — N390 Urinary tract infection, site not specified: Secondary | ICD-10-CM | POA: Diagnosis not present

## 2015-10-02 DIAGNOSIS — K449 Diaphragmatic hernia without obstruction or gangrene: Secondary | ICD-10-CM | POA: Diagnosis not present

## 2015-10-02 DIAGNOSIS — Z0000 Encounter for general adult medical examination without abnormal findings: Secondary | ICD-10-CM | POA: Diagnosis not present

## 2015-10-02 DIAGNOSIS — E7800 Pure hypercholesterolemia, unspecified: Secondary | ICD-10-CM | POA: Diagnosis not present

## 2015-10-02 DIAGNOSIS — N390 Urinary tract infection, site not specified: Secondary | ICD-10-CM | POA: Diagnosis not present

## 2015-10-02 DIAGNOSIS — F339 Major depressive disorder, recurrent, unspecified: Secondary | ICD-10-CM | POA: Diagnosis not present

## 2015-10-02 DIAGNOSIS — I10 Essential (primary) hypertension: Secondary | ICD-10-CM | POA: Diagnosis not present

## 2015-10-02 DIAGNOSIS — D509 Iron deficiency anemia, unspecified: Secondary | ICD-10-CM | POA: Diagnosis not present

## 2015-10-02 DIAGNOSIS — Z1389 Encounter for screening for other disorder: Secondary | ICD-10-CM | POA: Diagnosis not present

## 2015-10-02 DIAGNOSIS — G4733 Obstructive sleep apnea (adult) (pediatric): Secondary | ICD-10-CM | POA: Diagnosis not present

## 2015-10-02 DIAGNOSIS — I2510 Atherosclerotic heart disease of native coronary artery without angina pectoris: Secondary | ICD-10-CM | POA: Diagnosis not present

## 2015-10-02 DIAGNOSIS — Z86711 Personal history of pulmonary embolism: Secondary | ICD-10-CM | POA: Diagnosis not present

## 2015-10-02 DIAGNOSIS — F419 Anxiety disorder, unspecified: Secondary | ICD-10-CM | POA: Diagnosis not present

## 2015-10-17 DIAGNOSIS — H02051 Trichiasis without entropian right upper eyelid: Secondary | ICD-10-CM | POA: Diagnosis not present

## 2015-10-17 DIAGNOSIS — H2512 Age-related nuclear cataract, left eye: Secondary | ICD-10-CM | POA: Diagnosis not present

## 2015-10-18 ENCOUNTER — Encounter: Payer: Medicare Other | Attending: Physical Medicine and Rehabilitation | Admitting: Registered Nurse

## 2015-10-18 ENCOUNTER — Encounter: Payer: Self-pay | Admitting: Registered Nurse

## 2015-10-18 VITALS — BP 132/61 | HR 76 | Resp 14

## 2015-10-18 DIAGNOSIS — Z79899 Other long term (current) drug therapy: Secondary | ICD-10-CM | POA: Diagnosis not present

## 2015-10-18 DIAGNOSIS — M4302 Spondylolysis, cervical region: Secondary | ICD-10-CM | POA: Diagnosis not present

## 2015-10-18 DIAGNOSIS — R5382 Chronic fatigue, unspecified: Secondary | ICD-10-CM | POA: Diagnosis not present

## 2015-10-18 DIAGNOSIS — G894 Chronic pain syndrome: Secondary | ICD-10-CM | POA: Diagnosis not present

## 2015-10-18 DIAGNOSIS — M545 Low back pain, unspecified: Secondary | ICD-10-CM

## 2015-10-18 DIAGNOSIS — Z5181 Encounter for therapeutic drug level monitoring: Secondary | ICD-10-CM | POA: Diagnosis not present

## 2015-10-18 DIAGNOSIS — M797 Fibromyalgia: Secondary | ICD-10-CM | POA: Insufficient documentation

## 2015-10-18 MED ORDER — HYDROCODONE-ACETAMINOPHEN 5-325 MG PO TABS: ORAL_TABLET | ORAL | Status: DC

## 2015-10-18 NOTE — Progress Notes (Signed)
Subjective:    Patient ID: Kara Bush, female    DOB: 05/29/1941, 75 y.o.   MRN: [id number]  HPI: [name] is a 75 year old female who returns for follow up for chronic pain and medication refill. She states her pain is located in her upper and lower back mainly right side. She rates her pain 4. Her current exercise regime water exercises at the [facility] three times a week and walking short distances..   Pain Inventory Average Pain 4 Pain Right Now 4 My pain is NA  In the last 24 hours, has pain interfered with the following? General activity 5 Relation with others 0 Enjoyment of life 1 What TIME of day is your pain at its worst? daytime Sleep (in general) Good  Pain is worse with: walking, bending and some activites Pain improves with: medication Relief from Meds: fair  Mobility walk without assistance ability to climb steps?  yes do you drive?  yes Do you have any goals in this area?  no  Function retired I need assistance with the following:  household duties Do you have any goals in this area?  no  Neuro/Psych No problems in this area  Prior Studies Any changes since last visit?  no  Physicians involved in your care Any changes since last visit?  no   Family History  Problem Relation Age of Onset  . Heart disease Father   . Heart attack Father     @ 70  . Cancer Paternal Aunt     BREAST  . Heart failure Mother     at 82  . Healthy Sister   . Healthy Brother   . Heart failure Maternal Grandmother   . Heart attack Maternal Grandfather   . Heart attack Paternal Grandmother     at 5   Social History   Social History  . Marital Status: Divorced    Spouse Name: N/A  . Number of Children: 2  . Years of Education: college    Occupational History  .     Social History Main Topics  . Smoking status: Former Smoker    Quit date: 06/29/1990  . Smokeless tobacco: Never Used  . Alcohol Use: No  . Drug Use: No  . Sexual Activity:  Not Asked   Other Topics Concern  . None   Social History Narrative   Past Surgical History  Procedure Laterality Date  . Hernia repair    . Tubal ligation      BTL  . Coronary artery bypass graft  1998    LIMA-LAD;VG-DIAG & RCA  . Coronary stent placement  2005    TO LAD & LCX-STENTS,  . Aortobiexternal iliac bypass  1991  . Coronary angioplasty with stent placement  2006    to LAD, ATRETIC LIMA AT THAT TIME ALSO  . Coronary angioplasty with stent placement  2007    STENT TO PROX VG-DIAG, OTHER STENTS PATENT  . Cholecystectomy  1997   Past Medical History  Diagnosis Date  . Hypertension   . CAD (coronary artery disease)     hx CABG 1998, last cath 2007 with PCI and stenting to the proximal segment of the vein graft to the diagonal branch. DES Taxus was placed  . Depression   . Ulcerative colitis   . Hyperlipidemia   . Chronic fatigue fibromyalgia syndrome   . OSA (obstructive sleep apnea)   . PAD (peripheral artery disease) ([place])   . S/P resection  of aortic aneurysm   . Chest wall pain   . Cervicalgia   . Pes anserine bursitis   . Fibromyalgia   . Chronic pain syndrome   . Trochanteric bursitis   . Pulmonary embolism ([place]) 01/11/2013  . Anemia, improved 01/11/2013  . History of nuclear stress test 08/07/2011    lexiscan; no evidence of ischemia or infarct; low risk   . Abdominal aortic aneurysm ([place])   . Ulcerative colitis ([place])   . Obstructive sleep apnea   . Fibromyalgia   . Coronary artery disease   . Hypertension   . Basal cell carcinoma    BP 132/61 mmHg  Pulse 76  Resp 14  SpO2 100%  Opioid Risk Score:   Fall Risk Score:  `1  Depression screen PHQ 2/9  Depression screen [facility] 2/9 08/21/2015 11/07/2014 05/25/2013  Decreased Interest 0 0 0  Down, Depressed, Hopeless 0 0 0  PHQ - 2 Score 0 0 0  Tired, decreased energy - 1 -  Change in appetite - 1 -  Feeling bad or failure about yourself  - 0 -  Trouble concentrating - 0 -  Moving slowly or  fidgety/restless - 0 -  Suicidal thoughts - 0 -     Review of Systems  All other systems reviewed and are negative.      Objective:   Physical Exam  Constitutional: She is oriented to person, place, and time. She appears well-developed and well-nourished.  HENT:  Head: Normocephalic and atraumatic.  Neck: Normal range of motion. Neck supple.  Cardiovascular: Normal rate and regular rhythm.   Pulmonary/Chest: Effort normal and breath sounds normal.  Musculoskeletal:  Normal Muscle Bulk and Muscle Testing Reveals: Upper Extremities: Full ROM and Muscle Strength 5/5 Thoracic Paraspinal Tenderness: T-3- T-4 Mainly Right Side Sacral Tenderness Lower Extremities: Full ROM and Muscle Strength 5/5 Arises from chair with ease Narrow Based Gait  Neurological: She is alert and oriented to person, place, and time.  Skin: Skin is warm and dry.  Psychiatric: She has a normal mood and affect.  Nursing note and vitals reviewed.         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 2. Fibromyalgia:Continue Pool therapy and Exercise Regime 3. Bilateral intermittent hand numbness: Carpal tunnel syndrome versus C6 radiculopathy mild: No complaints Today. Continue to Monitor 4. LBP: Refilled: Norco 5/325mg  # 90 pills--use one pill tid for pain. A second rx was given for the following month.  20 minutes of face to face patient care time was spent during this visit. All questions were encouraged and answered.

## 2015-10-24 DIAGNOSIS — H02051 Trichiasis without entropian right upper eyelid: Secondary | ICD-10-CM | POA: Diagnosis not present

## 2015-10-24 DIAGNOSIS — H2512 Age-related nuclear cataract, left eye: Secondary | ICD-10-CM | POA: Diagnosis not present

## 2015-10-24 DIAGNOSIS — H04123 Dry eye syndrome of bilateral lacrimal glands: Secondary | ICD-10-CM | POA: Diagnosis not present

## 2015-10-24 DIAGNOSIS — H11111 Conjunctival deposits, right eye: Secondary | ICD-10-CM | POA: Diagnosis not present

## 2015-10-25 LAB — TOXASSURE SELECT,+ANTIDEPR,UR: PDF: 0

## 2015-10-25 NOTE — Progress Notes (Signed)
Urine drug screen for this encounter is consistent for prescribed medication 

## 2015-11-06 DIAGNOSIS — S40812A Abrasion of left upper arm, initial encounter: Secondary | ICD-10-CM | POA: Diagnosis not present

## 2015-11-06 DIAGNOSIS — M791 Myalgia: Secondary | ICD-10-CM | POA: Diagnosis not present

## 2015-11-06 DIAGNOSIS — S301XXA Contusion of abdominal wall, initial encounter: Secondary | ICD-10-CM | POA: Diagnosis not present

## 2015-11-06 DIAGNOSIS — S8011XA Contusion of right lower leg, initial encounter: Secondary | ICD-10-CM | POA: Diagnosis not present

## 2015-11-06 DIAGNOSIS — S1093XA Contusion of unspecified part of neck, initial encounter: Secondary | ICD-10-CM | POA: Diagnosis not present

## 2015-11-12 DIAGNOSIS — Z780 Asymptomatic menopausal state: Secondary | ICD-10-CM | POA: Diagnosis not present

## 2015-11-20 ENCOUNTER — Emergency Department (HOSPITAL_COMMUNITY): Payer: Medicare Other

## 2015-11-20 ENCOUNTER — Encounter (HOSPITAL_COMMUNITY): Payer: Self-pay | Admitting: Emergency Medicine

## 2015-11-20 ENCOUNTER — Emergency Department (HOSPITAL_COMMUNITY)
Admission: EM | Admit: 2015-11-20 | Discharge: 2015-11-20 | Disposition: A | Discharge status: Home / Self Care | Payer: Medicare Other | Attending: Emergency Medicine | Admitting: Emergency Medicine

## 2015-11-20 DIAGNOSIS — Z7982 Long term (current) use of aspirin: Secondary | ICD-10-CM | POA: Diagnosis not present

## 2015-11-20 DIAGNOSIS — I10 Essential (primary) hypertension: Secondary | ICD-10-CM | POA: Diagnosis not present

## 2015-11-20 DIAGNOSIS — R1084 Generalized abdominal pain: Secondary | ICD-10-CM | POA: Diagnosis present

## 2015-11-20 DIAGNOSIS — K5730 Diverticulosis of large intestine without perforation or abscess without bleeding: Secondary | ICD-10-CM | POA: Diagnosis not present

## 2015-11-20 DIAGNOSIS — E785 Hyperlipidemia, unspecified: Secondary | ICD-10-CM | POA: Diagnosis not present

## 2015-11-20 DIAGNOSIS — R1031 Right lower quadrant pain: Secondary | ICD-10-CM | POA: Diagnosis not present

## 2015-11-20 DIAGNOSIS — I2699 Other pulmonary embolism without acute cor pulmonale: Secondary | ICD-10-CM | POA: Insufficient documentation

## 2015-11-20 DIAGNOSIS — I25810 Atherosclerosis of coronary artery bypass graft(s) without angina pectoris: Secondary | ICD-10-CM | POA: Diagnosis not present

## 2015-11-20 DIAGNOSIS — Z87891 Personal history of nicotine dependence: Secondary | ICD-10-CM | POA: Insufficient documentation

## 2015-11-20 DIAGNOSIS — M545 Low back pain: Secondary | ICD-10-CM | POA: Diagnosis not present

## 2015-11-20 DIAGNOSIS — M5440 Lumbago with sciatica, unspecified side: Secondary | ICD-10-CM | POA: Diagnosis not present

## 2015-11-20 DIAGNOSIS — R1013 Epigastric pain: Secondary | ICD-10-CM | POA: Diagnosis not present

## 2015-11-20 DIAGNOSIS — Z79899 Other long term (current) drug therapy: Secondary | ICD-10-CM | POA: Insufficient documentation

## 2015-11-20 DIAGNOSIS — I739 Peripheral vascular disease, unspecified: Secondary | ICD-10-CM | POA: Insufficient documentation

## 2015-11-20 DIAGNOSIS — I714 Abdominal aortic aneurysm, without rupture: Secondary | ICD-10-CM | POA: Insufficient documentation

## 2015-11-20 LAB — URINALYSIS, ROUTINE W REFLEX MICROSCOPIC
BILIRUBIN URINE: NEGATIVE
GLUCOSE, UA: NEGATIVE mg/dL
Hgb urine dipstick: NEGATIVE
KETONES UR: NEGATIVE mg/dL
LEUKOCYTES UA: NEGATIVE
Nitrite: NEGATIVE
PH: 6 (ref 5.0–8.0)
Protein, ur: NEGATIVE mg/dL

## 2015-11-20 LAB — COMPREHENSIVE METABOLIC PANEL
ALBUMIN: 4.6 g/dL (ref 3.5–5.0)
ALT: 20 U/L (ref 14–54)
ANION GAP: 10 (ref 5–15)
AST: 25 U/L (ref 15–41)
Alkaline Phosphatase: 40 U/L (ref 38–126)
BUN: 22 mg/dL — AB (ref 6–20)
CHLORIDE: 104 mmol/L (ref 101–111)
CO2: 27 mmol/L (ref 22–32)
Calcium: 9.6 mg/dL (ref 8.9–10.3)
Creatinine, Ser: 0.84 mg/dL (ref 0.44–1.00)
GFR calc Af Amer: >60 mL/min (ref >60)
GFR calc non Af Amer: >60 mL/min (ref >60)
GLUCOSE: 105 mg/dL — AB (ref 65–99)
POTASSIUM: 4.1 mmol/L (ref 3.5–5.1)
SODIUM: 141 mmol/L (ref 135–145)
Total Bilirubin: 0.5 mg/dL (ref 0.3–1.2)
Total Protein: 7.3 g/dL (ref 6.5–8.1)

## 2015-11-20 LAB — CBC
HEMATOCRIT: 39.7 % (ref 36.0–46.0)
HEMOGLOBIN: 13.1 g/dL (ref 12.0–15.0)
MCH: 25.7 pg — ABNORMAL LOW (ref 26.0–34.0)
MCHC: 33 g/dL (ref 30.0–36.0)
MCV: 77.8 fL — AB (ref 78.0–100.0)
Platelets: 253 10*3/uL (ref 150–400)
RBC: 5.1 MIL/uL (ref 3.87–5.11)
RDW: 14.1 % (ref 11.5–15.5)
WBC: 9.3 10*3/uL (ref 4.0–10.5)

## 2015-11-20 LAB — LIPASE, BLOOD: LIPASE: 25 U/L (ref 11–51)

## 2015-11-20 MED ORDER — DIATRIZOATE MEGLUMINE & SODIUM 66-10 % PO SOLN
30.0000 mL | Freq: Once | ORAL | Status: AC
  Administered 2015-11-20: 30 mL via ORAL
  Filled 2015-11-20: qty 30

## 2015-11-20 MED ORDER — KETOROLAC TROMETHAMINE 30 MG/ML IJ SOLN
30.0000 mg | Freq: Once | INTRAMUSCULAR | Status: AC
  Administered 2015-11-20: 30 mg via INTRAVENOUS
  Filled 2015-11-20: qty 1

## 2015-11-20 MED ORDER — IOPAMIDOL (ISOVUE-300) INJECTION 61%
100.0000 mL | Freq: Once | INTRAVENOUS | Status: AC | PRN
  Administered 2015-11-20: 100 mL via INTRAVENOUS

## 2015-11-20 MED ORDER — SODIUM CHLORIDE 0.9 % IV BOLUS (SEPSIS)
500.0000 mL | Freq: Once | INTRAVENOUS | Status: AC
  Administered 2015-11-20: 500 mL via INTRAVENOUS

## 2015-11-20 MED ORDER — METHOCARBAMOL 500 MG PO TABS
500.0000 mg | ORAL_TABLET | Freq: Once | ORAL | Status: AC
  Administered 2015-11-20: 500 mg via ORAL
  Filled 2015-11-20: qty 1

## 2015-11-20 MED ORDER — ONDANSETRON HCL 4 MG/2ML IJ SOLN
4.0000 mg | Freq: Once | INTRAMUSCULAR | Status: AC
  Administered 2015-11-20: 4 mg via INTRAVENOUS
  Filled 2015-11-20: qty 2

## 2015-11-20 MED ORDER — METHOCARBAMOL 500 MG PO TABS: 500.0000 mg | ORAL_TABLET | Freq: Three times a day (TID) | ORAL | Status: DC | PRN

## 2015-11-20 MED ORDER — FENTANYL CITRATE (PF) 100 MCG/2ML IJ SOLN
50.0000 ug | Freq: Once | INTRAMUSCULAR | Status: AC
  Administered 2015-11-20: 50 ug via INTRAVENOUS
  Filled 2015-11-20: qty 2

## 2015-11-20 NOTE — ED Notes (Signed)
Pt being sent by PCP r/o acute appendicitis.  Pt c/o low back and RLQ pain since 0400 yesterday.  Pt taking Percocet w/o relief.

## 2015-11-20 NOTE — ED Notes (Signed)
Pt made aware of urine sample needed. 

## 2015-11-20 NOTE — ED Notes (Signed)
Per pt, states right lower abdominal pain and nausea-also having right hip pain

## 2015-11-20 NOTE — Discharge Instructions (Signed)
Back Pain, Adult °Back pain is very common in adults. The cause of back pain is rarely dangerous and the pain often gets better over time. The cause of your back pain may not be known. Some common causes of back pain include: °· Strain of the muscles or ligaments supporting the spine. °· Wear and tear (degeneration) of the spinal disks. °· Arthritis. °· Direct injury to the back. °For many people, back pain may return. Since back pain is rarely dangerous, most people can learn to manage this condition on their own. °HOME CARE INSTRUCTIONS °Watch your back pain for any changes. The following actions may help to lessen any discomfort you are feeling: °· Remain active. It is stressful on your back to sit or stand in one place for long periods of time. Do not sit, drive, or stand in one place for more than 30 minutes at a time. Take short walks on even surfaces as soon as you are able. Try to increase the length of time you walk each day. °· Exercise regularly as directed by your health care provider. Exercise helps your back heal faster. It also helps avoid future injury by keeping your muscles strong and flexible. °· Do not stay in bed. Resting more than 1-2 days can delay your recovery. °· Pay attention to your body when you bend and lift. The most comfortable positions are those that put less stress on your recovering back. Always use proper lifting techniques, including: °· Bending your knees. °· Keeping the load close to your body. °· Avoiding twisting. °· Find a comfortable position to sleep. Use a firm mattress and lie on your side with your knees slightly bent. If you lie on your back, put a pillow under your knees. °· Avoid feeling anxious or stressed. Stress increases muscle tension and can worsen back pain. It is important to recognize when you are anxious or stressed and learn ways to manage it, such as with exercise. °· Take medicines only as directed by your health care provider. Over-the-counter  medicines to reduce pain and inflammation are often the most helpful. Your health care provider may prescribe muscle relaxant drugs. These medicines help dull your pain so you can more quickly return to your normal activities and healthy exercise. °· Apply ice to the injured area: °· Put ice in a plastic bag. °· Place a towel between your skin and the bag. °· Leave the ice on for 20 minutes, 2-3 times a day for the first 2-3 days. After that, ice and heat may be alternated to reduce pain and spasms. °· Maintain a healthy weight. Excess weight puts extra stress on your back and makes it difficult to maintain good posture. °SEEK MEDICAL CARE IF: °· You have pain that is not relieved with rest or medicine. °· You have increasing pain going down into the legs or buttocks. °· You have pain that does not improve in one week. °· You have night pain. °· You lose weight. °· You have a fever or chills. °SEEK IMMEDIATE MEDICAL CARE IF:  °· You develop new bowel or bladder control problems. °· You have unusual weakness or numbness in your arms or legs. °· You develop nausea or vomiting. °· You develop abdominal pain. °· You feel faint. °  °This information is not intended to replace advice given to you by your health care provider. Make sure you discuss any questions you have with your health care provider. °  °Document Released: 07/21/2005 Document Revised: 08/11/2014 Document Reviewed: 11/22/2013 °Elsevier Interactive Patient Education ©2016 Else[employer]   Inc[employer]  Abdominal Pain, Adult Many things can cause abdominal pain. Usually, abdominal pain is not caused by a disease and will improve without treatment. It can often be observed and treated at home. Your health care provider will do a physical exam and possibly order blood tests and X-rays to help determine the seriousness of your pain. However, in many cases, more time must pass before a clear cause of the pain can be found. Before that point, your health care provider may  not know if you need more testing or further treatment. HOME CARE INSTRUCTIONS Monitor your abdominal pain for any changes. The following actions may help to alleviate any discomfort you are experiencing:  Only take over-the-counter or prescription medicines as directed by your health care provider.  Do not take laxatives unless directed to do so by your health care provider.  Try a clear liquid diet (broth, tea, or water) as directed by your health care provider. Slowly move to a bland diet as tolerated. SEEK MEDICAL CARE IF:  You have unexplained abdominal pain.  You have abdominal pain associated with nausea or diarrhea.  You have pain when you urinate or have a bowel movement.  You experience abdominal pain that wakes you in the night.  You have abdominal pain that is worsened or improved by eating food.  You have abdominal pain that is worsened with eating fatty foods.  You have a fever. SEEK IMMEDIATE MEDICAL CARE IF:  Your pain does not go away within 2 hours.  You keep throwing up (vomiting).  Your pain is felt only in portions of the abdomen, such as the right side or the left lower portion of the abdomen.  You pass bloody or black tarry stools. MAKE SURE YOU:  Understand these instructions.  Will watch your condition.  Will get help right away if you are not doing well or get worse.   This information is not intended to replace advice given to you by your health care provider. Make sure you discuss any questions you have with your health care provider.   Document Released: 04/30/2005 Document Revised: 04/11/2015 Document Reviewed: 03/30/2013 Elsevier Interactive Patient Education [employer].

## 2015-11-20 NOTE — ED Provider Notes (Signed)
CSN: [id number]     Arrival date & time 11/20/15  1324 History   First MD Initiated Contact with Patient 11/20/15 1503     Chief Complaint  Patient presents with  . Abdominal Pain     (Consider location/radiation/quality/duration/timing/severity/associated sxs/prior Treatment) HPI Patient presents with 36 hours of right lower back and right abdominal pain.. States she woke at 0400 hrs. yesterday morning with the pain. Improved with hydrocodone. States worsened throughout the day. Worse with movement. When her primary physician today and sent over for rule out appendicitis. Patient states she had some mild nausea which is improved. No vomiting. Normal bowel movement was yesterday. No blood or straining. Denies urinary symptoms. No dysuria, frequency, hematuria or urgency. Patient states she has been doing some heavy lifting recently. States pain radiates into her right buttock. Denies pain to the right leg. No numbness or weakness. No fever or chills. Past Medical History  Diagnosis Date  . Hypertension   . CAD (coronary artery disease)     hx CABG 1998, last cath 2007 with PCI and stenting to the proximal segment of the vein graft to the diagonal branch. DES Taxus was placed  . Depression   . Ulcerative colitis   . Hyperlipidemia   . Chronic fatigue fibromyalgia syndrome   . OSA (obstructive sleep apnea)   . PAD (peripheral artery disease) ([place])   . S/P resection of aortic aneurysm   . Chest wall pain   . Cervicalgia   . Pes anserine bursitis   . Fibromyalgia   . Chronic pain syndrome   . Trochanteric bursitis   . Pulmonary embolism ([place]) 01/11/2013  . Anemia, improved 01/11/2013  . History of nuclear stress test 08/07/2011    lexiscan; no evidence of ischemia or infarct; low risk   . Abdominal aortic aneurysm ([place])   . Ulcerative colitis ([place])   . Obstructive sleep apnea   . Fibromyalgia   . Coronary artery disease   . Hypertension   . Basal cell carcinoma    Past Surgical  History  Procedure Laterality Date  . Hernia repair    . Tubal ligation      BTL  . Coronary artery bypass graft  1998    LIMA-LAD;VG-DIAG & RCA  . Coronary stent placement  2005    TO LAD & LCX-STENTS,  . Aortobiexternal iliac bypass  1991  . Coronary angioplasty with stent placement  2006    to LAD, ATRETIC LIMA AT THAT TIME ALSO  . Coronary angioplasty with stent placement  2007    STENT TO PROX VG-DIAG, OTHER STENTS PATENT  . Cholecystectomy  1997   Family History  Problem Relation Age of Onset  . Heart disease Father   . Heart attack Father     @ 32  . Cancer Paternal Aunt     BREAST  . Heart failure Mother     at 17  . Healthy Sister   . Healthy Brother   . Heart failure Maternal Grandmother   . Heart attack Maternal Grandfather   . Heart attack Paternal Grandmother     at 46   Social History  Substance Use Topics  . Smoking status: Former Smoker    Quit date: 06/29/1990  . Smokeless tobacco: Never Used  . Alcohol Use: No   OB History    No data available     Review of Systems  Constitutional: Negative for fever and chills.  Respiratory: Negative for cough and shortness of breath.  Cardiovascular: Negative for chest pain.  Gastrointestinal: Positive for nausea and abdominal pain. Negative for vomiting, diarrhea, constipation and blood in stool.  Genitourinary: Negative for dysuria, frequency, hematuria, flank pain, difficulty urinating and pelvic pain.  Musculoskeletal: Positive for myalgias and back pain. Negative for neck pain.  Skin: Negative for rash and wound.  Neurological: Negative for dizziness, weakness, light-headedness, numbness and headaches.  All other systems reviewed and are negative.     Allergies  Atenolol; Bactrim; Cymbalta; Lyrica; and Penicillins  Home Medications   Prior to Admission medications   Medication Sig Start Date End Date Taking? Authorizing Provider  aspirin 81 MG tablet Take 81 mg by mouth daily.    Yes Historical  Provider, MD  b complex vitamins capsule Take 1 capsule by mouth daily.   Yes Historical Provider, MD  chlorhexidine (PERIDEX) 0.12 % solution Use as directed 15 mLs in the mouth or throat at bedtime.  04/17/14  Yes Historical Provider, MD  Cholecalciferol (VITAMIN D-3) 1000 UNITS CAPS Take 1 capsule by mouth daily.   Yes Historical Provider, MD  citalopram (CELEXA) 40 MG tablet Take 20 mg by mouth at bedtime.   Yes Historical Provider, MD  HYDROcodone-acetaminophen (NORCO/VICODIN) 5-325 MG tablet TAKE 1 TABLET BY MOUTH THREE TIMES A DAY 10/18/15  Yes Bayard Hugger, NP  lisinopril (PRINIVIL,ZESTRIL) 5 MG tablet TAKE 1 TABLET (5 MG TOTAL) BY MOUTH DAILY. 08/02/15  Yes Pixie Casino, MD  metoprolol succinate (TOPROL-XL) 25 MG 24 hr tablet TAKE 1/2 TABLET EVERY DAY 09/10/15  Yes Pixie Casino, MD  Multiple Vitamin (MULITIVITAMIN WITH MINERALS) TABS Take 1 tablet by mouth daily.     Yes Historical Provider, MD  naproxen sodium (ANAPROX) 220 MG tablet Take 220 mg by mouth 2 (two) times daily with a meal. For pain.   Yes Historical Provider, MD  simvastatin (ZOCOR) 40 MG tablet TAKE 1 TABLET BY MOUTH EVERY DAY AT [facility] 08/22/15  Yes Pixie Casino, MD  ZETIA 10 MG tablet TAKE 1 TABLET BY MOUTH EVERY DAY 07/24/15  Yes Pixie Casino, MD  zolpidem (AMBIEN) 10 MG tablet Take 5 mg by mouth at bedtime. For sleep.   Yes Historical Provider, MD  methocarbamol (ROBAXIN) 500 MG tablet Take 1 tablet (500 mg total) by mouth every 8 (eight) hours as needed for muscle spasms. 11/20/15   Julianne Rice, MD   BP 130/51 mmHg  Pulse 72  Temp(Src) 97.6 F (36.4 C) (Oral)  Resp 15  SpO2 100% Physical Exam  Constitutional: She is oriented to person, place, and time. She appears well-developed and well-nourished. No distress.  HENT:  Head: Normocephalic and atraumatic.  Mouth/Throat: Oropharynx is clear and moist. No oropharyngeal exudate.  Eyes: EOM are normal. Pupils are equal, round, and reactive to light.   Neck: Normal range of motion. Neck supple.  Cardiovascular: Normal rate and regular rhythm.   Pulmonary/Chest: Effort normal and breath sounds normal. No respiratory distress. She has no wheezes. She has no rales.  Abdominal: Soft. Bowel sounds are normal. She exhibits no distension and no mass. There is tenderness (very mild epigastric and right lower quadrant tenderness. There is no rebound or guarding.). There is no rebound and no guarding.  Musculoskeletal: Normal range of motion. She exhibits tenderness. She exhibits no edema.  No CVA tenderness. No midline thoracic or lumbar tenderness. Patient does have some right paraspinal tenderness. This extends to the right buttocks. Full range of motion without pain of all joints. No lower extremity  swelling or asymmetry. Distal pulses equal and intact.  Neurological: She is alert and oriented to person, place, and time.  5/5 motor in all extremities. Sensation is fully intact.  Skin: Skin is warm and dry. No rash noted. No erythema.  Psychiatric: She has a normal mood and affect. Her behavior is normal.  Nursing note and vitals reviewed.   ED Course  Procedures (including critical care time) Labs Review Labs Reviewed  COMPREHENSIVE METABOLIC PANEL - Abnormal; Notable for the following:    Glucose, Bld 105 (*)    BUN 22 (*)    All other components within normal limits  CBC - Abnormal; Notable for the following:    MCV 77.8 (*)    MCH 25.7 (*)    All other components within normal limits  URINALYSIS, ROUTINE W REFLEX MICROSCOPIC (NOT AT [facility]) - Abnormal; Notable for the following:    Specific Gravity, Urine >1.046 (*)    All other components within normal limits  LIPASE, BLOOD    Imaging Review Ct Abdomen Pelvis W Contrast  11/20/2015  CLINICAL DATA:  Low back and right lower quadrant abdominal pain for approximately 36 hours. Evaluate for appendicitis. EXAM: CT ABDOMEN AND PELVIS WITH CONTRAST TECHNIQUE: Multidetector CT imaging of the  abdomen and pelvis was performed using the standard protocol following bolus administration of intravenous contrast. CONTRAST:  18mL ISOVUE-300 IOPAMIDOL (ISOVUE-300) INJECTION 61% COMPARISON:  CT 02/23/2014. FINDINGS: Lower chest: The lung bases appear stable. There is a tiny left lower lobe nodule on image number 20 which is stable. No significant pleural or pericardial effusion. A small hiatal hernia is present. Hepatobiliary: The liver is normal in density without focal abnormality. Stable mild intra and extrahepatic biliary dilatation status post cholecystectomy, likely physiologic. Pancreas: Unremarkable. No pancreatic ductal dilatation or surrounding inflammatory changes. Spleen: 2 low-density splenic lesions are again noted, the larger measuring 1.9 cm on image 24. These are unchanged. Adrenals/Urinary Tract: Both adrenal glands appear normal. Stable probable cyst posteriorly in the mid right kidney on image 38. No suspicious renal findings. No evidence of urinary tract calculus or hydronephrosis. The bladder appears normal. Stomach/Bowel: No evidence of bowel wall thickening, distention or surrounding inflammatory change. The appendix appears normal. There are diverticular changes within the sigmoid colon. Vascular/Lymphatic: There are no enlarged abdominal or pelvic lymph nodes. Aorto iliac bypass graft appears patent. There is diffuse underlying aortic and branch vessel atherosclerosis. No large vessel occlusion identified. Reproductive: The uterus and ovaries appear normal. No evidence of adnexal mass. Other: Stable postsurgical changes within the anterior abdominal wall related to previous hernia repair. There is persistent anterior protuberance of the omental fat and diastasis of the rectus abdominus muscles, but no recurrent focal hernia. Musculoskeletal: No acute or significant osseous findings. Stable lumbar spondylosis with degenerative grade 1 anterolisthesis at L4-5. IMPRESSION: 1. No evidence  of acute appendicitis or other acute abdominal findings. 2. Sigmoid diverticulosis. 3. Diffuse atherosclerosis status post aorto bi-iliac grafting. 4. Stable low-density splenic and right renal lesions. 5. Stable biliary dilatation status post cholecystectomy, likely physiologic based on stability. Electronically Signed   By: Richardean Sale M.D.   On: 11/20/2015 16:24   I have personally reviewed and evaluated these images and lab results as part of my medical decision-making.   EKG Interpretation None      MDM   Final diagnoses:  Right low back pain, with sciatica presence unspecified  Generalized abdominal pain    Low suspicion for appendicitis. The patient has a benign abdomen.  Given medical history we'll get CT scan.  Patient's abdominal exam remains benign. Vital signs stable. CT without any evidence of appendicitis. Pain likely is musculoskeletal versus radicular in origin. We'll treat symptomatically and have follow-up with her primary physician. Return precautions have been given.  Julianne Rice, MD 11/20/15 [phone]

## 2015-11-22 DIAGNOSIS — H11111 Conjunctival deposits, right eye: Secondary | ICD-10-CM | POA: Diagnosis not present

## 2015-11-22 DIAGNOSIS — H524 Presbyopia: Secondary | ICD-10-CM | POA: Diagnosis not present

## 2015-11-22 DIAGNOSIS — H2512 Age-related nuclear cataract, left eye: Secondary | ICD-10-CM | POA: Diagnosis not present

## 2015-11-22 DIAGNOSIS — H02051 Trichiasis without entropian right upper eyelid: Secondary | ICD-10-CM | POA: Diagnosis not present

## 2015-11-22 DIAGNOSIS — H04123 Dry eye syndrome of bilateral lacrimal glands: Secondary | ICD-10-CM | POA: Diagnosis not present

## 2015-12-18 ENCOUNTER — Encounter: Payer: Medicare Other | Attending: Physical Medicine and Rehabilitation | Admitting: Physical Medicine & Rehabilitation

## 2015-12-18 ENCOUNTER — Encounter: Payer: Self-pay | Admitting: Physical Medicine & Rehabilitation

## 2015-12-18 VITALS — BP 159/73 | HR 79 | Resp 14

## 2015-12-18 DIAGNOSIS — M7060 Trochanteric bursitis, unspecified hip: Secondary | ICD-10-CM | POA: Diagnosis not present

## 2015-12-18 DIAGNOSIS — M4302 Spondylolysis, cervical region: Secondary | ICD-10-CM | POA: Diagnosis not present

## 2015-12-18 DIAGNOSIS — G9332 Myalgic encephalomyelitis/chronic fatigue syndrome: Secondary | ICD-10-CM

## 2015-12-18 DIAGNOSIS — M797 Fibromyalgia: Secondary | ICD-10-CM | POA: Diagnosis not present

## 2015-12-18 DIAGNOSIS — Z79899 Other long term (current) drug therapy: Secondary | ICD-10-CM | POA: Diagnosis not present

## 2015-12-18 DIAGNOSIS — M545 Low back pain, unspecified: Secondary | ICD-10-CM

## 2015-12-18 DIAGNOSIS — R5382 Chronic fatigue, unspecified: Secondary | ICD-10-CM | POA: Diagnosis not present

## 2015-12-18 DIAGNOSIS — Z5181 Encounter for therapeutic drug level monitoring: Secondary | ICD-10-CM | POA: Insufficient documentation

## 2015-12-18 MED ORDER — HYDROCODONE-ACETAMINOPHEN 5-325 MG PO TABS: ORAL_TABLET | ORAL | Status: DC

## 2015-12-18 NOTE — Patient Instructions (Addendum)
PLEASE CALL ME WITH ANY PROBLEMS OR QUESTIONS [id number]).     YOU CAN TAKE AN EXTRA NAPROXEN ON DAYS WHEN YOU ARE MORE ACTIVE OR WHEN YOUR PAIN IS WORSE  (TOTAL OF 440MG )

## 2015-12-18 NOTE — Progress Notes (Signed)
Subjective:    Patient ID: Kara Bush, female    DOB: Mar 04, 1941, 75 y.o.   MRN: [id number]  HPI   Kara Bush is here in follow up of her chronic pain. She had some RLQ pain a month ago with an elevated WBC which precipitated a trip to the ED. Work up was negative. She has some ongoing low back pain an intermittent pain in the RLQ. I reviewed the CT which appears to show mild anterolisthesis of L4 on L5 as well as DDD of L2-3.   Kara Bush feels that she can manage her pain with pacing and exercise. She likes water activities still. She uses hydrocodone for breakthrough pain. She also uses naproxen BID with meals.        Pain Inventory Average Pain 4 Pain Right Now 3 My pain is aching  In the last 24 hours, has pain interfered with the following? General activity 2 Relation with others 2 Enjoyment of life 2 What TIME of day is your pain at its worst? daytime Sleep (in general) Good  Pain is worse with: bending and some activites Pain improves with: pacing activities and medication Relief from Meds: 5  Mobility walk without assistance ability to climb steps?  yes do you drive?  yes Do you have any goals in this area?  no  Function Do you have any goals in this area?  no  Neuro/Psych No problems in this area  Prior Studies Any changes since last visit?  no  Physicians involved in your care Any changes since last visit?  no   Family History  Problem Relation Age of Onset  . Heart disease Father   . Heart attack Father     @ 110  . Cancer Paternal Aunt     BREAST  . Heart failure Mother     at 75  . Healthy Sister   . Healthy Brother   . Heart failure Maternal Grandmother   . Heart attack Maternal Grandfather   . Heart attack Paternal Grandmother     at 18   Social History   Social History  . Marital Status: Divorced    Spouse Name: N/A  . Number of Children: 2  . Years of Education: college    Occupational History  .     Social  History Main Topics  . Smoking status: Former Smoker    Quit date: 06/29/1990  . Smokeless tobacco: Never Used  . Alcohol Use: No  . Drug Use: No  . Sexual Activity: Not Asked   Other Topics Concern  . None   Social History Narrative   Past Surgical History  Procedure Laterality Date  . Hernia repair    . Tubal ligation      BTL  . Coronary artery bypass graft  1998    LIMA-LAD;VG-DIAG & RCA  . Coronary stent placement  2005    TO LAD & LCX-STENTS,  . Aortobiexternal iliac bypass  1991  . Coronary angioplasty with stent placement  2006    to LAD, ATRETIC LIMA AT THAT TIME ALSO  . Coronary angioplasty with stent placement  2007    STENT TO PROX VG-DIAG, OTHER STENTS PATENT  . Cholecystectomy  1997   Past Medical History  Diagnosis Date  . Hypertension   . CAD (coronary artery disease)     hx CABG 1998, last cath 2007 with PCI and stenting to the proximal segment of the vein graft to the diagonal branch. DES Taxus was  placed  . Depression   . Ulcerative colitis   . Hyperlipidemia   . Chronic fatigue fibromyalgia syndrome   . OSA (obstructive sleep apnea)   . PAD (peripheral artery disease) ([place])   . S/P resection of aortic aneurysm   . Chest wall pain   . Cervicalgia   . Pes anserine bursitis   . Fibromyalgia   . Chronic pain syndrome   . Trochanteric bursitis   . Pulmonary embolism ([place]) 01/11/2013  . Anemia, improved 01/11/2013  . History of nuclear stress test 08/07/2011    lexiscan; no evidence of ischemia or infarct; low risk   . Abdominal aortic aneurysm ([place])   . Ulcerative colitis ([place])   . Obstructive sleep apnea   . Fibromyalgia   . Coronary artery disease   . Hypertension   . Basal cell carcinoma    BP 159/73 mmHg  Pulse 79  Resp 14  SpO2 98%  Opioid Risk Score:   Fall Risk Score:  `1  Depression screen PHQ 2/9  Depression screen [facility] 2/9 08/21/2015 11/07/2014 05/25/2013  Decreased Interest 0 0 0  Down, Depressed, Hopeless 0 0 0  PHQ - 2 Score  0 0 0  Tired, decreased energy - 1 -  Change in appetite - 1 -  Feeling bad or failure about yourself  - 0 -  Trouble concentrating - 0 -  Moving slowly or fidgety/restless - 0 -  Suicidal thoughts - 0 -     Review of Systems  All other systems reviewed and are negative.      Objective:   Physical Exam  Constitutional: She is oriented to person, place, and time. She appears well-developed and well-nourished. She is mildly obese  HENT:  Head: Normocephalic and atraumatic.  Eyes: Conjunctivae are normal. Pupils are equal, round, and reactive to light.  Neck: Normal range of motion. Neck supple.  Cardiovascular: Normal rate and regular rhythm.  Pulmonary/Chest: Effort normal and breath sounds normal.  Abdominal: Soft. Bowel sounds are normal. She exhibits no distension.  Musculoskeletal: She exhibits generalized tenderness ---no edema.  -generalized pain with lumbar ROM today---in all planes. Right lumbar paraspinals and SIJ seem more tender today.  -both greater trochs are slightlytender  Neurological: She is alert and oriented to person, place, and time.  Skin: Skin is warm and dry.  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 Fibromyalgia:  -fairly well maintained  -maintain aquatic program and general exercise.  -daily stretching  3. Bilateral intermittent hand numbness: resolved.  4. LBP related to lumbar spondylosis and perhaps SIJ and troch bursitis  -refilled Norco 5/325mg  # 90 pills--use one pill tid for pain. A second rx was provided for next month. -She remains consistent with her counts and our program.  -suggested trying naproxen 440mg  on days when her pain increases---may be a better option than the mid day ibuprofen -if symptoms worsen, we can look at addressing the L4-5 and potentially the L2-3 area noted  on CT. 5. Follow up with me in about 2 months. 15 minutes of face to face patient care time were spent during this visit. All questions were encouraged and answered.

## 2016-01-30 DIAGNOSIS — H6120 Impacted cerumen, unspecified ear: Secondary | ICD-10-CM | POA: Diagnosis not present

## 2016-01-30 DIAGNOSIS — H9193 Unspecified hearing loss, bilateral: Secondary | ICD-10-CM | POA: Diagnosis not present

## 2016-02-01 DIAGNOSIS — H903 Sensorineural hearing loss, bilateral: Secondary | ICD-10-CM | POA: Diagnosis not present

## 2016-02-18 ENCOUNTER — Ambulatory Visit: Payer: PRIVATE HEALTH INSURANCE | Admitting: Physical Medicine & Rehabilitation

## 2016-02-19 ENCOUNTER — Encounter: Payer: Medicare Other | Attending: Physical Medicine and Rehabilitation | Admitting: Registered Nurse

## 2016-02-19 ENCOUNTER — Encounter: Payer: Self-pay | Admitting: Registered Nurse

## 2016-02-19 VITALS — BP 164/77 | HR 102 | Resp 14

## 2016-02-19 DIAGNOSIS — R5382 Chronic fatigue, unspecified: Secondary | ICD-10-CM | POA: Diagnosis not present

## 2016-02-19 DIAGNOSIS — M545 Low back pain, unspecified: Secondary | ICD-10-CM

## 2016-02-19 DIAGNOSIS — M797 Fibromyalgia: Secondary | ICD-10-CM | POA: Diagnosis not present

## 2016-02-19 DIAGNOSIS — M4302 Spondylolysis, cervical region: Secondary | ICD-10-CM | POA: Insufficient documentation

## 2016-02-19 DIAGNOSIS — Z5181 Encounter for therapeutic drug level monitoring: Secondary | ICD-10-CM | POA: Diagnosis not present

## 2016-02-19 DIAGNOSIS — Z79899 Other long term (current) drug therapy: Secondary | ICD-10-CM | POA: Insufficient documentation

## 2016-02-19 DIAGNOSIS — G894 Chronic pain syndrome: Secondary | ICD-10-CM

## 2016-02-19 MED ORDER — HYDROCODONE-ACETAMINOPHEN 5-325 MG PO TABS
ORAL_TABLET | ORAL | Status: DC
Start: 2016-02-19 — End: 2016-02-19

## 2016-02-19 MED ORDER — HYDROCODONE-ACETAMINOPHEN 5-325 MG PO TABS: ORAL_TABLET | ORAL | Status: DC

## 2016-02-19 NOTE — Progress Notes (Signed)
Subjective:    Patient ID: Kara Bush, female    DOB: 1941-01-13, 75 y.o.   MRN: [id number]  HPI:  [name] is a 75 year old female who returns for follow up for chronic pain and medication refill. She states her pain is located in her mid- lower back mainly right side. She did not rate her pain. Her current exercise regime water exercises at the [facility] three times a week and walking short distances..   Pain Inventory Average Pain pain score not recorded Pain Right Now pain score not recorded My pain is pain descriptors not recorded  In the last 24 hours, has pain interfered with the following? General activity 5 Relation with others 0 Enjoyment of life 4 What TIME of day is your pain at its worst? daytime Sleep (in general) Good  Pain is worse with: some activites Pain improves with: medication Relief from Meds: 5  Mobility walk without assistance how many minutes can you walk? 5 Do you have any goals in this area?  no  Function retired Do you have any goals in this area?  no  Neuro/Psych depression anxiety  Prior Studies Any changes since last visit?  no  Physicians involved in your care Any changes since last visit?  no   Family History  Problem Relation Age of Onset  . Heart disease Father   . Heart attack Father     @ 27  . Cancer Paternal Aunt     BREAST  . Heart failure Mother     at 80  . Healthy Sister   . Healthy Brother   . Heart failure Maternal Grandmother   . Heart attack Maternal Grandfather   . Heart attack Paternal Grandmother     at 38   Social History   Social History  . Marital Status: Divorced    Spouse Name: N/A  . Number of Children: 2  . Years of Education: college    Occupational History  .     Social History Main Topics  . Smoking status: Former Smoker    Quit date: 06/29/1990  . Smokeless tobacco: Never Used  . Alcohol Use: No  . Drug Use: No  . Sexual Activity: Not Asked   Other Topics  Concern  . None   Social History Narrative   Past Surgical History  Procedure Laterality Date  . Hernia repair    . Tubal ligation      BTL  . Coronary artery bypass graft  1998    LIMA-LAD;VG-DIAG & RCA  . Coronary stent placement  2005    TO LAD & LCX-STENTS,  . Aortobiexternal iliac bypass  1991  . Coronary angioplasty with stent placement  2006    to LAD, ATRETIC LIMA AT THAT TIME ALSO  . Coronary angioplasty with stent placement  2007    STENT TO PROX VG-DIAG, OTHER STENTS PATENT  . Cholecystectomy  1997   Past Medical History  Diagnosis Date  . Hypertension   . CAD (coronary artery disease)     hx CABG 1998, last cath 2007 with PCI and stenting to the proximal segment of the vein graft to the diagonal branch. DES Taxus was placed  . Depression   . Ulcerative colitis   . Hyperlipidemia   . Chronic fatigue fibromyalgia syndrome   . OSA (obstructive sleep apnea)   . PAD (peripheral artery disease) ([place])   . S/P resection of aortic aneurysm   . Chest wall pain   .  Cervicalgia   . Pes anserine bursitis   . Fibromyalgia   . Chronic pain syndrome   . Trochanteric bursitis   . Pulmonary embolism ([place]) 01/11/2013  . Anemia, improved 01/11/2013  . History of nuclear stress test 08/07/2011    lexiscan; no evidence of ischemia or infarct; low risk   . Abdominal aortic aneurysm ([place])   . Ulcerative colitis ([place])   . Obstructive sleep apnea   . Fibromyalgia   . Coronary artery disease   . Hypertension   . Basal cell carcinoma    BP 164/77 mmHg  Pulse 102  Resp 14  SpO2 97%  Opioid Risk Score:   Fall Risk Score:  `1  Depression screen PHQ 2/9  Depression screen [facility] 2/9 08/21/2015 11/07/2014 05/25/2013  Decreased Interest 0 0 0  Down, Depressed, Hopeless 0 0 0  PHQ - 2 Score 0 0 0  Tired, decreased energy - 1 -  Change in appetite - 1 -  Feeling bad or failure about yourself  - 0 -  Trouble concentrating - 0 -  Moving slowly or fidgety/restless - 0 -  Suicidal  thoughts - 0 -     Review of Systems  Constitutional: Negative.   HENT: Negative.   Eyes: Negative.   Respiratory: Negative.   Cardiovascular: Negative.   Gastrointestinal: Negative.   Endocrine: Negative.   Genitourinary: Negative.   Musculoskeletal: Positive for back pain.  Skin: Negative.   Psychiatric/Behavioral: Positive for dysphoric mood. The patient is nervous/anxious.   All other systems reviewed and are negative.      Objective:   Physical Exam  Constitutional: She is oriented to person, place, and time. She appears well-developed and well-nourished.  HENT:  Head: Normocephalic and atraumatic.  Neck: Normal range of motion. Neck supple.  Cardiovascular: Normal rate and regular rhythm.   Pulmonary/Chest: Effort normal and breath sounds normal.  Musculoskeletal:  Normal Muscle Bulk and Muscle Testing Reveals: Upper Extremities: Full ROM and Muscle Strength 5/5 Thoracic Paraspinal Tenderness: T-7- T-9 Lumbar Paraspinal Tenderness: L-3-L-5 Lower Extremities: Full ROM and Muscle Strength 5/5 Arises from Table with ease Narrow based Gait   Neurological: She is alert and oriented to person, place, and time.  Skin: Skin is warm and dry.  Psychiatric: She has a normal mood and affect.  Nursing note and vitals reviewed.         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 2. Fibromyalgia: Continue Pool therapyand Home Exercise Program 3. Bilateral intermittent hand numbness: Carpal tunnel syndrome versus C6 radiculopathy mild: Continue to Monitor 4. LBP: Refilled: Norco 5/325mg  # 90 pills--use one pill tid for pain. A second prescription was given for the following month.  20 minutes of face to face patient care time was spent during this visit. All questions were encouraged and answered.

## 2016-02-28 LAB — TOXASSURE SELECT,+ANTIDEPR,UR: PDF: 0

## 2016-03-03 ENCOUNTER — Other Ambulatory Visit: Payer: Self-pay | Admitting: Internal Medicine

## 2016-03-03 NOTE — Progress Notes (Signed)
Urine drug screen for this encounter is consistent for prescribed medications.   

## 2016-03-18 DIAGNOSIS — H2512 Age-related nuclear cataract, left eye: Secondary | ICD-10-CM | POA: Diagnosis not present

## 2016-03-27 DIAGNOSIS — H903 Sensorineural hearing loss, bilateral: Secondary | ICD-10-CM | POA: Diagnosis not present

## 2016-03-29 ENCOUNTER — Other Ambulatory Visit: Payer: Self-pay | Admitting: Otolaryngology

## 2016-03-29 DIAGNOSIS — H903 Sensorineural hearing loss, bilateral: Secondary | ICD-10-CM

## 2016-04-03 DIAGNOSIS — Z23 Encounter for immunization: Secondary | ICD-10-CM | POA: Diagnosis not present

## 2016-04-03 DIAGNOSIS — Z79899 Other long term (current) drug therapy: Secondary | ICD-10-CM | POA: Diagnosis not present

## 2016-04-03 DIAGNOSIS — D509 Iron deficiency anemia, unspecified: Secondary | ICD-10-CM | POA: Diagnosis not present

## 2016-04-03 DIAGNOSIS — E7800 Pure hypercholesterolemia, unspecified: Secondary | ICD-10-CM | POA: Diagnosis not present

## 2016-04-03 DIAGNOSIS — F339 Major depressive disorder, recurrent, unspecified: Secondary | ICD-10-CM | POA: Diagnosis not present

## 2016-04-03 DIAGNOSIS — G479 Sleep disorder, unspecified: Secondary | ICD-10-CM | POA: Diagnosis not present

## 2016-04-03 DIAGNOSIS — I10 Essential (primary) hypertension: Secondary | ICD-10-CM | POA: Diagnosis not present

## 2016-04-03 DIAGNOSIS — F419 Anxiety disorder, unspecified: Secondary | ICD-10-CM | POA: Diagnosis not present

## 2016-04-17 ENCOUNTER — Ambulatory Visit
Admission: RE | Admit: 2016-04-17 | Discharge: 2016-04-17 | Disposition: A | Discharge status: Home / Self Care | Payer: Medicare Other | Source: Ambulatory Visit | Attending: Otolaryngology | Admitting: Otolaryngology

## 2016-04-17 DIAGNOSIS — H903 Sensorineural hearing loss, bilateral: Secondary | ICD-10-CM

## 2016-04-17 DIAGNOSIS — H9192 Unspecified hearing loss, left ear: Secondary | ICD-10-CM | POA: Diagnosis not present

## 2016-04-17 MED ORDER — GADOBENATE DIMEGLUMINE 529 MG/ML IV SOLN
17.0000 mL | Freq: Once | INTRAVENOUS | Status: AC | PRN
  Administered 2016-04-17: 17 mL via INTRAVENOUS

## 2016-04-22 ENCOUNTER — Encounter: Payer: Self-pay | Admitting: Registered Nurse

## 2016-04-22 ENCOUNTER — Encounter: Payer: Medicare Other | Attending: Physical Medicine and Rehabilitation | Admitting: Registered Nurse

## 2016-04-22 VITALS — BP 137/81 | HR 92 | Resp 17

## 2016-04-22 DIAGNOSIS — M545 Low back pain, unspecified: Secondary | ICD-10-CM

## 2016-04-22 DIAGNOSIS — M4302 Spondylolysis, cervical region: Secondary | ICD-10-CM | POA: Diagnosis not present

## 2016-04-22 DIAGNOSIS — R5382 Chronic fatigue, unspecified: Secondary | ICD-10-CM | POA: Diagnosis not present

## 2016-04-22 DIAGNOSIS — G894 Chronic pain syndrome: Secondary | ICD-10-CM | POA: Diagnosis not present

## 2016-04-22 DIAGNOSIS — Z79899 Other long term (current) drug therapy: Secondary | ICD-10-CM | POA: Diagnosis not present

## 2016-04-22 DIAGNOSIS — M797 Fibromyalgia: Secondary | ICD-10-CM | POA: Diagnosis not present

## 2016-04-22 DIAGNOSIS — Z5181 Encounter for therapeutic drug level monitoring: Secondary | ICD-10-CM

## 2016-04-22 MED ORDER — HYDROCODONE-ACETAMINOPHEN 5-325 MG PO TABS: ORAL_TABLET | ORAL | 0 refills | Status: DC

## 2016-04-22 NOTE — Progress Notes (Signed)
Subjective:    Patient ID: Kara Bush, female    DOB: [birth year], 75 y.o.   MRN: [id number]  HPI : [name] is a 75 year old female who returns for follow up for chronic pain and medication refill. She states her pain is located in her right shoulder and lower back mainly right side. She rates her pain 6. Her current exercise regime water exercises at the [facility] two times a week and walking short distances..   Pain Inventory Average Pain 4 Pain Right Now 6 My pain is constant and tingling  In the last 24 hours, has pain interfered with the following? General activity 2 Relation with others 0 Enjoyment of life 2 What TIME of day is your pain at its worst? morning Sleep (in general) Good  Pain is worse with: walking and bending Pain improves with: medication Relief from Meds: fair  Mobility walk without assistance how many minutes can you walk? 5 Do you have any goals in this area?  no  Function retired Do you have any goals in this area?  no  Neuro/Psych No problems in this area  Prior Studies Any changes since last visit?  no  Physicians involved in your care Any changes since last visit?  no   Family History  Problem Relation Age of Onset  . Heart disease Father   . Heart attack Father     @ 64  . Cancer Paternal Aunt     BREAST  . Heart failure Mother     at 43  . Healthy Sister   . Healthy Brother   . Heart failure Maternal Grandmother   . Heart attack Maternal Grandfather   . Heart attack Paternal Grandmother     at 78   Social History   Social History  . Marital status: Divorced    Spouse name: N/A  . Number of children: 2  . Years of education: college    Occupational History  .  Retired   Social History Main Topics  . Smoking status: Former Smoker    Quit date: 06/29/1990  . Smokeless tobacco: Never Used  . Alcohol use No  . Drug use: No  . Sexual activity: Not Asked   Other Topics Concern  . None   Social  History Narrative  . None   Past Surgical History:  Procedure Laterality Date  . [place]  . CHOLECYSTECTOMY  1997  . CORONARY ANGIOPLASTY WITH STENT PLACEMENT  2006   to LAD, ATRETIC LIMA AT THAT TIME ALSO  . CORONARY ANGIOPLASTY WITH STENT PLACEMENT  2007   STENT TO PROX VG-DIAG, OTHER STENTS PATENT  . CORONARY ARTERY BYPASS GRAFT  1998   LIMA-LAD;VG-DIAG & RCA  . CORONARY STENT PLACEMENT  2005   TO LAD & LCX-STENTS,  . HERNIA REPAIR    . TUBAL LIGATION     BTL   Past Medical History:  Diagnosis Date  . Abdominal aortic aneurysm ([place])   . Anemia, improved 01/11/2013  . Basal cell carcinoma   . CAD (coronary artery disease)    hx CABG 1998, last cath 2007 with PCI and stenting to the proximal segment of the vein graft to the diagonal branch. DES Taxus was placed  . Cervicalgia   . Chest wall pain   . Chronic fatigue fibromyalgia syndrome   . Chronic pain syndrome   . Coronary artery disease   . Depression   . Fibromyalgia   .  Fibromyalgia   . History of nuclear stress test 08/07/2011   lexiscan; no evidence of ischemia or infarct; low risk   . Hyperlipidemia   . Hypertension   . Hypertension   . Obstructive sleep apnea   . OSA (obstructive sleep apnea)   . PAD (peripheral artery disease) ([place])   . Pes anserine bursitis   . Pulmonary embolism ([place]) 01/11/2013  . S/P resection of aortic aneurysm   . Trochanteric bursitis   . Ulcerative colitis   . Ulcerative colitis ([place])    BP 137/81   Pulse 92   Resp 17   SpO2 95%   Opioid Risk Score:   Fall Risk Score:  `1  Depression screen PHQ 2/9  Depression screen [facility] 2/9 08/21/2015 11/07/2014 05/25/2013  Decreased Interest 0 0 0  Down, Depressed, Hopeless 0 0 0  PHQ - 2 Score 0 0 0  Tired, decreased energy - 1 -  Change in appetite - 1 -  Feeling bad or failure about yourself  - 0 -  Trouble concentrating - 0 -  Moving slowly or fidgety/restless - 0 -  Suicidal thoughts - 0 -    Review  of Systems  All other systems reviewed and are negative.      Objective:   Physical Exam  Constitutional: She is oriented to person, place, and time. She appears well-developed and well-nourished.  HENT:  Head: Normocephalic and atraumatic.  Neck: Normal range of motion. Neck supple.  Cardiovascular: Normal rate and regular rhythm.   Pulmonary/Chest: Effort normal and breath sounds normal.  Musculoskeletal:  Normal Muscle Bulk and Muscle Testing Reveals:  Upper Extremities: Full ROM and Muscle Strength 5/5 Thoracic Paraspinal Tenderness: T-3- T-5 Mainly Right Side Lumbar Paraspinal Tenderness: L-3- L-5 Mainly Right Side Lower Extremities: Full ROM and Muscle Strength 5/5 Arises from Table with ease Narrow Based Gait   Neurological: She is alert and oriented to person, place, and time.  Skin: Skin is warm and dry.  Psychiatric: She has a normal mood and affect.  Nursing note and vitals reviewed.         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 2. Fibromyalgia: Continue Pool therapyand Home Exercise Program 3. Bilateral intermittent hand numbness: Carpal tunnel syndrome versus C6 radiculopathy mild: Continue to Monitor 4. LBP: Refilled: Norco 5/325mg  # 90 pills--use one pill tid for pain. A second prescription was given for the following month.  20 minutes of face to face patient care time was spent during this visit. All questions were encouraged and answered.

## 2016-05-17 ENCOUNTER — Other Ambulatory Visit (HOSPITAL_COMMUNITY): Payer: Self-pay | Admitting: Internal Medicine

## 2016-06-04 ENCOUNTER — Other Ambulatory Visit (HOSPITAL_COMMUNITY): Payer: Self-pay | Admitting: Internal Medicine

## 2016-06-24 ENCOUNTER — Encounter: Payer: Self-pay | Admitting: Registered Nurse

## 2016-06-24 ENCOUNTER — Encounter: Payer: Medicare Other | Attending: Physical Medicine and Rehabilitation | Admitting: Registered Nurse

## 2016-06-24 VITALS — BP 128/81 | HR 89

## 2016-06-24 DIAGNOSIS — Z5181 Encounter for therapeutic drug level monitoring: Secondary | ICD-10-CM | POA: Diagnosis not present

## 2016-06-24 DIAGNOSIS — M4302 Spondylolysis, cervical region: Secondary | ICD-10-CM | POA: Diagnosis not present

## 2016-06-24 DIAGNOSIS — M7062 Trochanteric bursitis, left hip: Secondary | ICD-10-CM

## 2016-06-24 DIAGNOSIS — M7061 Trochanteric bursitis, right hip: Secondary | ICD-10-CM

## 2016-06-24 DIAGNOSIS — M797 Fibromyalgia: Secondary | ICD-10-CM

## 2016-06-24 DIAGNOSIS — M545 Low back pain, unspecified: Secondary | ICD-10-CM

## 2016-06-24 DIAGNOSIS — G8929 Other chronic pain: Secondary | ICD-10-CM

## 2016-06-24 DIAGNOSIS — R5382 Chronic fatigue, unspecified: Secondary | ICD-10-CM | POA: Diagnosis not present

## 2016-06-24 DIAGNOSIS — G894 Chronic pain syndrome: Secondary | ICD-10-CM

## 2016-06-24 DIAGNOSIS — Z79899 Other long term (current) drug therapy: Secondary | ICD-10-CM | POA: Diagnosis not present

## 2016-06-24 MED ORDER — HYDROCODONE-ACETAMINOPHEN 5-325 MG PO TABS: ORAL_TABLET | ORAL | 0 refills | Status: DC

## 2016-06-24 MED ORDER — METHOCARBAMOL 500 MG PO TABS: 500.0000 mg | ORAL_TABLET | Freq: Three times a day (TID) | ORAL | 0 refills | Status: DC | PRN

## 2016-06-24 NOTE — Progress Notes (Signed)
Subjective:    Patient ID: Kara Bush, female    DOB: [birth year], 75 y.o.   MRN: [id number]  HPI: Kara Bush is a 75 year old female who returns for follow up for chronic pain and medication refill. She states her pain is located in her mid and lower back. She rates her pain 4. Her current exercise regime water exercises at the [facility] two times a week and walking short distances..   Pain Inventory Average Pain 4 Pain Right Now 4 My pain is constant, dull and aching  In the last 24 hours, has pain interfered with the following? General activity 5 Relation with others 1 Enjoyment of life 1 What TIME of day is your pain at its worst? evening Sleep (in general) Good  Pain is worse with: bending and some activites Pain improves with: rest and medication Relief from Meds: 4  Mobility walk without assistance Do you have any goals in this area?  no  Function retired Do you have any goals in this area?  no  Neuro/Psych depression anxiety  Prior Studies Any changes since last visit?  no  Physicians involved in your care Any changes since last visit?  no   Family History  Problem Relation Age of Onset  . Heart disease Father   . Heart attack Father     @ 25  . Cancer Paternal Aunt     BREAST  . Heart failure Mother     at 49  . Healthy Sister   . Healthy Brother   . Heart failure Maternal Grandmother   . Heart attack Maternal Grandfather   . Heart attack Paternal Grandmother     at 8   Social History   Social History  . Marital status: Divorced    Spouse name: N/A  . Number of children: 2  . Years of education: college    Occupational History  .  Retired   Social History Main Topics  . Smoking status: Former Smoker    Quit date: 06/29/1990  . Smokeless tobacco: Never Used  . Alcohol use No  . Drug use: No  . Sexual activity: Not Asked   Other Topics Concern  . None   Social History Narrative  . None   Past Surgical History:    Procedure Laterality Date  . [place]  . CHOLECYSTECTOMY  1997  . CORONARY ANGIOPLASTY WITH STENT PLACEMENT  2006   to LAD, ATRETIC LIMA AT THAT TIME ALSO  . CORONARY ANGIOPLASTY WITH STENT PLACEMENT  2007   STENT TO PROX VG-DIAG, OTHER STENTS PATENT  . CORONARY ARTERY BYPASS GRAFT  1998   LIMA-LAD;VG-DIAG & RCA  . CORONARY STENT PLACEMENT  2005   TO LAD & LCX-STENTS,  . HERNIA REPAIR    . TUBAL LIGATION     BTL   Past Medical History:  Diagnosis Date  . Abdominal aortic aneurysm ([place])   . Anemia, improved 01/11/2013  . Basal cell carcinoma   . CAD (coronary artery disease)    hx CABG 1998, last cath 2007 with PCI and stenting to the proximal segment of the vein graft to the diagonal branch. DES Taxus was placed  . Cervicalgia   . Chest wall pain   . Chronic fatigue fibromyalgia syndrome   . Chronic pain syndrome   . Coronary artery disease   . Depression   . Fibromyalgia   . Fibromyalgia   . History of nuclear stress test 08/07/2011  lexiscan; no evidence of ischemia or infarct; low risk   . Hyperlipidemia   . Hypertension   . Hypertension   . Obstructive sleep apnea   . OSA (obstructive sleep apnea)   . PAD (peripheral artery disease) ([place])   . Pes anserine bursitis   . Pulmonary embolism ([place]) 01/11/2013  . S/P resection of aortic aneurysm   . Trochanteric bursitis   . Ulcerative colitis   . Ulcerative colitis ([place])    BP 128/81 (BP Location: Right Arm, Patient Position: Sitting, Cuff Size: Large)   Pulse 89   SpO2 93%   Opioid Risk Score:   Fall Risk Score:  `1  Depression screen PHQ 2/9  Depression screen [facility] 2/9 08/21/2015 11/07/2014 05/25/2013  Decreased Interest 0 0 0  Down, Depressed, Hopeless 0 0 0  PHQ - 2 Score 0 0 0  Tired, decreased energy - 1 -  Change in appetite - 1 -  Feeling bad or failure about yourself  - 0 -  Trouble concentrating - 0 -  Moving slowly or fidgety/restless - 0 -  Suicidal thoughts - 0 -     Review of Systems  Constitutional: Negative.   HENT: Negative.   Eyes: Negative.   Respiratory: Negative.   Cardiovascular: Negative.   Gastrointestinal: Negative.   Genitourinary: Negative.   Musculoskeletal: Positive for arthralgias, back pain and myalgias.  Skin: Negative.   Allergic/Immunologic: Negative.   Neurological: Negative.   Hematological: Negative.   Psychiatric/Behavioral: Positive for dysphoric mood. The patient is nervous/anxious.   All other systems reviewed and are negative.      Objective:   Physical Exam  Constitutional: She is oriented to person, place, and time. She appears well-developed and well-nourished.  HENT:  Head: Normocephalic and atraumatic.  Neck: Normal range of motion. Neck supple.  Cardiovascular: Normal rate and regular rhythm.   Pulmonary/Chest: Effort normal and breath sounds normal.  Musculoskeletal:  Normal Muscle Bulk and Muscle Testing Reveals: Upper Extremities: Full ROM and Muscle Strength 5/5 Thoracic Paraspinal Tenderness: T-4-T-6 Lumbar Paraspinal Tenderness: L-4-L-5 Lower Extremities: Full ROM and Muscle Strength 5/5 Arises from Table with ease Narrow Based Gait  Neurological: She is alert and oriented to person, place, and time.  Skin: Skin is warm and dry.  Psychiatric: She has a normal mood and affect.  Nursing note and vitals reviewed.         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 2. Fibromyalgia: Continue Pool therapyand Home Exercise Program 3. Bilateral intermittent hand numbness: Carpal tunnel syndrome versus C6 radiculopathy mild: Continue to Monitor 4. LBP: Refilled: Norco 5/325mg  # 90 pills--use one pill tid for pain. A second prescription was given for the following month. We will continue the opioid monitoring program, this consists of regular clinic visits, examinations,  urine drug screen, pill counts as well as use of [place] Controlled Substance Reporting System.  5. Muscle Spasm: RX: Robaxin  20 minutes of face to face patient care time was spent during this visit. All questions were encouraged and answered.   F/U in 2 months

## 2016-06-24 NOTE — Addendum Note (Signed)
Addended by: Elinor Parkinson I on: 06/24/2016 02:47 PM   Modules accepted: Orders

## 2016-07-02 LAB — TOXASSURE SELECT,+ANTIDEPR,UR

## 2016-07-03 ENCOUNTER — Other Ambulatory Visit: Payer: Self-pay | Admitting: *Deleted

## 2016-07-03 ENCOUNTER — Encounter: Payer: Self-pay | Admitting: Internal Medicine

## 2016-07-03 ENCOUNTER — Ambulatory Visit (INDEPENDENT_AMBULATORY_CARE_PROVIDER_SITE_OTHER): Payer: Medicare Other | Admitting: Internal Medicine

## 2016-07-03 VITALS — BP 135/84 | HR 89 | Ht 64.0 in | Wt 185.4 lb

## 2016-07-03 DIAGNOSIS — I25810 Atherosclerosis of coronary artery bypass graft(s) without angina pectoris: Secondary | ICD-10-CM

## 2016-07-03 DIAGNOSIS — Z951 Presence of aortocoronary bypass graft: Secondary | ICD-10-CM

## 2016-07-03 DIAGNOSIS — I739 Peripheral vascular disease, unspecified: Secondary | ICD-10-CM | POA: Diagnosis not present

## 2016-07-03 DIAGNOSIS — I712 Thoracic aortic aneurysm, without rupture, unspecified: Secondary | ICD-10-CM

## 2016-07-03 DIAGNOSIS — I7121 Aneurysm of the ascending aorta, without rupture: Secondary | ICD-10-CM

## 2016-07-03 NOTE — Progress Notes (Signed)
07/03/2016   PCP: Gerrit Heck, MD   Chief Complaint  Patient presents with  . Follow-up    HPI: 75 year old white female with a history of coronary artery disease as well as peripheral vascular disease. She had bypass grafting in 1998 and stents in 2005 2006 and 2000 710 negative nuclear stress test in 2013 she also has peripheral arterial disease and history of aortic iliac bypass in 1991. Last ABIs were made of 2013 were normal.  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  Fortunately she was asymptomatic, she was placed on anticoagulation with the Xarelto.   She has done well since that time.  Her last visit with Dr. Debara Pickett was in February and he was planning to recheck d-dimer and if it was still elevated she would need to continue her Xarelto for total of 6 months.  Her d-dimer continues to be elevated at 0.67. Initial d-dimer in February was 1.48.  Venous Dopplers were also done in February which were negative for DVT.    Kara Bush just had a repeat D. dimer which was elevated at 1.48, actually slightly higher than it had been before. She tells a story that over the past several weeks she's had a urinary tract infection and started taking Bactrim for which she is more nauseated and is actually thrown up. She reports some tenderness across her epigastric area as well as the costovertebral angles. She's also had fever, chills and some sweating at night. She is planning on seeing her primary care doctor back in the office this afternoon. She denies any worsening shortness of breath or chest pain. He's had no bleeding complications on Xarelto.  Kara Bush today for follow-up. She recently reports some shortness of breath with exertion. She occasionally gets twinges in her chest however it is not reminiscent of her prior angina. Nevertheless her last stress  test was 2 years ago. I think a lot of her shortness of breath is due to weight gain and minimal exercise. She does water exercise but not water aerobics.  I saw Kara Bush today in follow-up of her stress test and lower extremity Dopplers. Unfortunately she did not get the lower extremity Dopplers. She did undergo a metabolic stress testing. This showed no circulatory limitation to exercise. The main factors in her shortness of breath seems to be obesity and some pulmonary restriction. Currently she denies any significant leg claudication.  07/03/2106  Kara Bush Bush today for follow-up. Overall she's feeling fairly well. She had lower chimney arterial Dopplers a year ago which showed normal ABIs. She gets some occasional pain, burning and itching in her extremities which may be related to fibromyalgia. She denies any worsening chest pain or shortness of breath with exertion.    Allergies  Allergen Reactions  . Atenolol Other (See Comments)    cholitis  . Bactrim [Sulfamethoxazole-Trimethoprim] Nausea And Vomiting  . Cymbalta [Duloxetine Hcl] Other (See Comments)    Mad pt feel spacey  . Lyrica [Pregabalin] Other (See Comments)    Made pt feel spacey  . Penicillins Rash    Current Outpatient Prescriptions  Medication Sig Dispense Refill  . aspirin 81 MG tablet Take 81 mg by mouth daily.     Marland Kitchen b complex vitamins capsule Take 1 capsule by mouth daily.    . chlorhexidine (PERIDEX) 0.12 % solution Use as directed 15  mLs in the mouth or throat at bedtime.     . Cholecalciferol (VITAMIN D-3) 1000 UNITS CAPS Take 1 capsule by mouth daily.    . citalopram (CELEXA) 40 MG tablet Take 20 mg by mouth at bedtime.    Marland Kitchen HYDROcodone-acetaminophen (NORCO/VICODIN) 5-325 MG tablet TAKE 1 TABLET BY MOUTH THREE TIMES A DAY 90 tablet 0  . lisinopril (PRINIVIL,ZESTRIL) 5 MG tablet TAKE 1 TABLET BY MOUTH EVERY DAY 90 tablet 1  . methocarbamol (ROBAXIN) 500 MG tablet Take 1 tablet (500 mg total) by  mouth every 8 (eight) hours as needed for muscle spasms. 30 tablet 0  . metoprolol succinate (TOPROL-XL) 25 MG 24 hr tablet TAKE 1/2 TABLET EVERY DAY 45 tablet 2  . Multiple Vitamin (MULITIVITAMIN WITH MINERALS) TABS Take 1 tablet by mouth daily.      . naproxen sodium (ANAPROX) 220 MG tablet Take 220 mg by mouth 2 (two) times daily with a meal. For pain.    . simvastatin (ZOCOR) 40 MG tablet TAKE 1 TABLET BY MOUTH EVERY DAY AT 6PM 90 tablet 3  . ZETIA 10 MG tablet TAKE 1 TABLET BY MOUTH EVERY DAY 90 tablet 1  . zolpidem (AMBIEN) 10 MG tablet Take 5 mg by mouth at bedtime. For sleep.     No current facility-administered medications for this visit.     Past Medical History:  Diagnosis Date  . Abdominal aortic aneurysm ([place])   . Anemia, improved 01/11/2013  . Basal cell carcinoma   . CAD (coronary artery disease)    hx CABG 1998, last cath 2007 with PCI and stenting to the proximal segment of the vein graft to the diagonal branch. DES Taxus was placed  . Cervicalgia   . Chest wall pain   . Chronic fatigue fibromyalgia syndrome   . Chronic pain syndrome   . Coronary artery disease   . Depression   . Fibromyalgia   . Fibromyalgia   . History of nuclear stress test 08/07/2011   lexiscan; no evidence of ischemia or infarct; low risk   . Hyperlipidemia   . Hypertension   . Hypertension   . Obstructive sleep apnea   . OSA (obstructive sleep apnea)   . PAD (peripheral artery disease) ([place])   . Pes anserine bursitis   . Pulmonary embolism ([place]) 01/11/2013  . S/P resection of aortic aneurysm   . Trochanteric bursitis   . Ulcerative colitis   . Ulcerative colitis [facility])     Past Surgical History:  Procedure Laterality Date  . [place]  . CHOLECYSTECTOMY  1997  . CORONARY ANGIOPLASTY WITH STENT PLACEMENT  2006   to LAD, ATRETIC LIMA AT THAT TIME ALSO  . CORONARY ANGIOPLASTY WITH STENT PLACEMENT  2007   STENT TO PROX VG-DIAG, OTHER STENTS PATENT  . CORONARY  ARTERY BYPASS GRAFT  1998   LIMA-LAD;VG-DIAG & RCA  . CORONARY STENT PLACEMENT  2005   TO LAD & LCX-STENTS,  . HERNIA REPAIR    . TUBAL LIGATION     BTL    [id number] colds or fevers, no weight changes Skin:no rashes or ulcers HEENT:no blurred vision, no congestion CV:see HPI PUL:see HPI GI:no diarrhea constipation or melena, no indigestion GU:no hematuria, no dysuria MS:no joint pain, no claudication Neuro:no syncope, no lightheadedness Endo:no diabetes, no thyroid disease  PHYSICAL EXAM BP 135/84   Pulse 89   Ht 5\' 4"  (1.626 m)   Wt 185 lb 6.4 oz (84.1 kg)   BMI 31.82  kg/m  General appearance: alert, no distress and moderately obese Neck: no carotid bruit and no JVD Lungs: clear to auscultation bilaterally Heart: regular rate and rhythm and systolic murmur: early systolic 2/6, blowing at 2nd right intercostal space Abdomen: soft, non-tender; bowel sounds normal; no masses,  no organomegaly Extremities: extremities normal, atraumatic, no cyanosis or edema Pulses: 2+ and symmetric Skin: Skin color, texture, turgor normal. No rashes or lesions Neurologic: Grossly normal Psych: Pleasant  ASSESSMENT AND PLAN Patient Active Problem List   Diagnosis Date Noted  . Trochanteric bursitis 11/07/2014  . Sacro-iliac pain 11/07/2014  . Low back pain 11/07/2013  . Hepatitis 04/20/2013  . Screening for STD (sexually transmitted disease) 04/20/2013  . Recurrent UTI 04/20/2013  . Abdominal aortic aneurysm ([place])   . Ulcerative colitis ([place])   . Obstructive sleep apnea   . Fibromyalgia   . Coronary artery disease   . Basal cell carcinoma   . Anemia, improved 01/11/2013  . Ascending aortic aneurysm, 4.1 cm 01/11/2013  . 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 Chronic periscapular pain 10/15/2011  . Chest wall pain   . S/P resection of aortic aneurysm, 1998 prior to CABG   . PAD (peripheral artery disease), aortic-iliac bypass 1991, mild carotid bruits   . OSA (obstructive  sleep apnea)   . Chronic fatigue fibromyalgia syndrome   . Hyperlipidemia   . Ulcerative colitis   . Depression   . CAD (coronary artery disease), CABG 1998, STENTS MULTIPLE SITES SINCE.    Marland Kitchen Hypertension    PLAN: Kara Bush Denies any new symptoms claudication. She is due for repeat lower extremity arterial Dopplers which will be performed next month. From a cardiac standpoint she denies any worsening chest pain. She does have a history of coronary disease with prior bypass and will need monitoring of that. It was also noted that she has had a prior ascending aortic aneurysm measuring 4.1 cm. She will need repeat evaluation of this as well. She did have CT of the abdomen and pelvis with contrast in April 2017 which showed no evidence of abdominal aneurysm and no significant changes to her bypass graft.  Follow-up with me annually or sooner as necessary.  Pixie Casino, MD, [facility] Attending Cardiologist [place]

## 2016-07-03 NOTE — Patient Instructions (Signed)
Medication Instructions:   NO CHANGE  Testing/Procedures:  Your physician has requested that you have a lower extremity arterial duplex. During this test, ultrasound are used to evaluate arterial blood flow in the legs. Allow one hour for this exam. There are no restrictions or special instructions.   Follow-Up:  Your physician wants you to follow-up in: [place] will receive a reminder letter in the mail two months in advance. If you don't receive a letter, please call our office to schedule the follow-up appointment.   If you need a refill on your cardiac medications before your next appointment, please call your pharmacy.

## 2016-07-16 ENCOUNTER — Other Ambulatory Visit: Payer: Self-pay | Admitting: *Deleted

## 2016-07-16 ENCOUNTER — Other Ambulatory Visit: Payer: Self-pay | Admitting: Internal Medicine

## 2016-07-16 DIAGNOSIS — I2510 Atherosclerotic heart disease of native coronary artery without angina pectoris: Secondary | ICD-10-CM

## 2016-07-16 NOTE — Telephone Encounter (Signed)
Rx(s) sent to pharmacy electronically.  

## 2016-07-18 ENCOUNTER — Inpatient Hospital Stay: Admission: RE | Admit: 2016-07-18 | Payer: Medicare Other | Source: Ambulatory Visit

## 2016-07-22 DIAGNOSIS — I2510 Atherosclerotic heart disease of native coronary artery without angina pectoris: Secondary | ICD-10-CM | POA: Diagnosis not present

## 2016-07-22 LAB — BASIC METABOLIC PANEL
BUN: 21 mg/dL (ref 7–25)
CO2: 28 mmol/L (ref 20–31)
CREATININE: 0.86 mg/dL (ref 0.60–0.93)
Calcium: 9.4 mg/dL (ref 8.6–10.4)
Chloride: 104 mmol/L (ref 98–110)
GLUCOSE: 95 mg/dL (ref 65–99)
POTASSIUM: 4.4 mmol/L (ref 3.5–5.3)
Sodium: 140 mmol/L (ref 135–146)

## 2016-07-24 ENCOUNTER — Ambulatory Visit (INDEPENDENT_AMBULATORY_CARE_PROVIDER_SITE_OTHER)
Admission: RE | Admit: 2016-07-24 | Discharge: 2016-07-24 | Disposition: A | Discharge status: Home / Self Care | Payer: Medicare Other | Source: Ambulatory Visit | Attending: Internal Medicine | Admitting: Internal Medicine

## 2016-07-24 DIAGNOSIS — I712 Thoracic aortic aneurysm, without rupture, unspecified: Secondary | ICD-10-CM

## 2016-07-24 MED ORDER — IOPAMIDOL (ISOVUE-370) INJECTION 76%
100.0000 mL | Freq: Once | INTRAVENOUS | Status: AC | PRN
  Administered 2016-07-24: 100 mL via INTRAVENOUS

## 2016-07-29 ENCOUNTER — Other Ambulatory Visit: Payer: Self-pay | Admitting: Internal Medicine

## 2016-07-29 DIAGNOSIS — I739 Peripheral vascular disease, unspecified: Secondary | ICD-10-CM

## 2016-08-07 ENCOUNTER — Ambulatory Visit (HOSPITAL_COMMUNITY)
Admission: RE | Admit: 2016-08-07 | Discharge: 2016-08-07 | Disposition: A | Discharge status: Home / Self Care | Payer: Medicare Other | Source: Ambulatory Visit | Attending: Cardiology | Admitting: Cardiology

## 2016-08-07 ENCOUNTER — Other Ambulatory Visit: Payer: Self-pay | Admitting: Internal Medicine

## 2016-08-07 DIAGNOSIS — I739 Peripheral vascular disease, unspecified: Secondary | ICD-10-CM

## 2016-08-07 DIAGNOSIS — I350 Nonrheumatic aortic (valve) stenosis: Secondary | ICD-10-CM | POA: Insufficient documentation

## 2016-08-07 DIAGNOSIS — I745 Embolism and thrombosis of iliac artery: Secondary | ICD-10-CM | POA: Insufficient documentation

## 2016-08-11 ENCOUNTER — Other Ambulatory Visit: Payer: Self-pay | Admitting: *Deleted

## 2016-08-11 ENCOUNTER — Other Ambulatory Visit: Payer: Self-pay | Admitting: Internal Medicine

## 2016-08-11 DIAGNOSIS — I739 Peripheral vascular disease, unspecified: Secondary | ICD-10-CM

## 2016-08-11 DIAGNOSIS — I714 Abdominal aortic aneurysm, without rupture, unspecified: Secondary | ICD-10-CM

## 2016-08-26 ENCOUNTER — Encounter: Payer: Medicare Other | Attending: Physical Medicine and Rehabilitation | Admitting: Physical Medicine & Rehabilitation

## 2016-08-26 ENCOUNTER — Encounter: Payer: Self-pay | Admitting: Physical Medicine & Rehabilitation

## 2016-08-26 VITALS — BP 124/79 | HR 86 | Resp 14

## 2016-08-26 DIAGNOSIS — R5382 Chronic fatigue, unspecified: Secondary | ICD-10-CM | POA: Diagnosis not present

## 2016-08-26 DIAGNOSIS — Z79899 Other long term (current) drug therapy: Secondary | ICD-10-CM | POA: Diagnosis not present

## 2016-08-26 DIAGNOSIS — Z5181 Encounter for therapeutic drug level monitoring: Secondary | ICD-10-CM | POA: Insufficient documentation

## 2016-08-26 DIAGNOSIS — M4302 Spondylolysis, cervical region: Secondary | ICD-10-CM | POA: Insufficient documentation

## 2016-08-26 DIAGNOSIS — M545 Low back pain: Secondary | ICD-10-CM | POA: Diagnosis not present

## 2016-08-26 DIAGNOSIS — M797 Fibromyalgia: Secondary | ICD-10-CM | POA: Diagnosis not present

## 2016-08-26 MED ORDER — HYDROCODONE-ACETAMINOPHEN 5-325 MG PO TABS: ORAL_TABLET | ORAL | 0 refills | Status: DC

## 2016-08-26 MED ORDER — METHOCARBAMOL 500 MG PO TABS: 500.0000 mg | ORAL_TABLET | Freq: Three times a day (TID) | ORAL | 4 refills | Status: DC | PRN

## 2016-08-26 NOTE — Patient Instructions (Signed)
PLEASE FEEL FREE TO CALL OUR OFFICE WITH ANY PROBLEMS OR QUESTIONS ([phone])      

## 2016-08-26 NOTE — Progress Notes (Signed)
Subjective:    Patient ID: Kara Bush, female    DOB: May 20, 1941, 76 y.o.   MRN: [id number]  HPI   Kara Bush is here in follow up of her chronic pain. She is stable from a pain standpoint but she's noticed that she feels more fatigued in general. She doesn't feel that the hydrocodone lasts as long, but they still provide some benefit. They certainly help her at night as does her [place].   She also remains on robaxin for spasms. Naproxen is taken twice daily, and she sometimes using ibuprofen in the middle of the day as well.  She continues with water exercises at the pool, weather permitting.    Pain Inventory Average Pain 4 Pain Right Now 4 My pain is constant and aching  In the last 24 hours, has pain interfered with the following? General activity 2 Relation with others 0 Enjoyment of life 2 What TIME of day is your pain at its worst? daytime Sleep (in general) Good  Pain is worse with: walking, bending and some activites Pain improves with: pacing activities and medication Relief from Meds: 5  Mobility walk without assistance how many minutes can you walk? 5-10 ability to climb steps?  yes do you drive?  yes Do you have any goals in this area?  no  Function retired Do you have any goals in this area?  no  Neuro/Psych trouble walking  Prior Studies Any changes since last visit?  no  Physicians involved in your care Any changes since last visit?  no   Family History  Problem Relation Age of Onset  . Heart disease Father   . Heart attack Father     @ 51  . Cancer Paternal Aunt     BREAST  . Heart failure Mother     at 48  . Healthy Sister   . Healthy Brother   . Heart failure Maternal Grandmother   . Heart attack Maternal Grandfather   . Heart attack Paternal Grandmother     at 17   Social History   Social History  . Marital status: Divorced    Spouse name: N/A  . Number of children: 2  . Years of education: college    Occupational  History  .  Retired   Social History Main Topics  . Smoking status: Former Smoker    Quit date: 06/29/1990  . Smokeless tobacco: Never Used  . Alcohol use No  . Drug use: No  . Sexual activity: Not Asked   Other Topics Concern  . None   Social History Narrative  . None   Past Surgical History:  Procedure Laterality Date  . [place]  . CHOLECYSTECTOMY  1997  . CORONARY ANGIOPLASTY WITH STENT PLACEMENT  2006   to LAD, ATRETIC LIMA AT THAT TIME ALSO  . CORONARY ANGIOPLASTY WITH STENT PLACEMENT  2007   STENT TO PROX VG-DIAG, OTHER STENTS PATENT  . CORONARY ARTERY BYPASS GRAFT  1998   LIMA-LAD;VG-DIAG & RCA  . CORONARY STENT PLACEMENT  2005   TO LAD & LCX-STENTS,  . HERNIA REPAIR    . TUBAL LIGATION     BTL   Past Medical History:  Diagnosis Date  . Abdominal aortic aneurysm ([place])   . Anemia, improved 01/11/2013  . Basal cell carcinoma   . CAD (coronary artery disease)    hx CABG 1998, last cath 2007 with PCI and stenting to the proximal segment of the vein graft to  the diagonal branch. DES Taxus was placed  . Cervicalgia   . Chest wall pain   . Chronic fatigue fibromyalgia syndrome   . Chronic pain syndrome   . Coronary artery disease   . Depression   . Fibromyalgia   . Fibromyalgia   . History of nuclear stress test 08/07/2011   lexiscan; no evidence of ischemia or infarct; low risk   . Hyperlipidemia   . Hypertension   . Hypertension   . Obstructive sleep apnea   . OSA (obstructive sleep apnea)   . PAD (peripheral artery disease) ([place])   . Pes anserine bursitis   . Pulmonary embolism ([place]) 01/11/2013  . S/P resection of aortic aneurysm   . Trochanteric bursitis   . Ulcerative colitis   . Ulcerative colitis ([place])    BP 124/79   Pulse 86   Resp 14   SpO2 96%   Opioid Risk Score:   Fall Risk Score:  `1  Depression screen PHQ 2/9  Depression screen [facility] 2/9 08/21/2015 11/07/2014 05/25/2013  Decreased Interest 0 0 0  Down,  Depressed, Hopeless 0 0 0  PHQ - 2 Score 0 0 0  Tired, decreased energy - 1 -  Change in appetite - 1 -  Feeling bad or failure about yourself  - 0 -  Trouble concentrating - 0 -  Moving slowly or fidgety/restless - 0 -  Suicidal thoughts - 0 -    Review of Systems     Objective:   Physical Exam  Constitutional: She is oriented to person, place, and time. She appears well-developed and well-nourished. She is mildly obese  HENT:  Head: Normocephalic and atraumatic.  Eyes: Conjunctivae are normal. Pupils are equal, round, and reactive to light.  Neck: Normal range of motion. Neck supple.  Cardiovascular: RRR  Pulmonary/Chest: CTA B.  Abdominal: Soft. Bowel sounds are normal. She exhibits no distension.  Musculoskeletal: She exhibits generalized tenderness ---no edema.  -generalized pain with lumbar ROM today---in all planes. Right lumbar paraspinals and SIJ stable    Neurological: She is alert and oriented to person, place, and time.  Skin: Skin is warm and dry.  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 Fibromyalgia:  -fairly well maintained  -maintain aquatic program and general exercise.   3. Bilateral intermittent hand numbness: resolved.  4. LBP related to lumbar spondylosis and perhaps SIJ and troch bursitis  -refilled Norco 5/325mg  # 90 pills--use one pill tid for pain. A second rx was provided for next month. -She remains consistent with her counts and our program.  -ok to use ibuprofen between naproxen doses for now as long as there isn't an increase. -if symptoms worsen, we can look at addressing the L4-5 and potentially the  L2-3 area noted on CT. 5. Follow up with me in about 2 months. 15 minutes of face to face patient care time were spent during this visit. All questions were encouraged and answered.

## 2016-09-04 ENCOUNTER — Other Ambulatory Visit: Payer: Self-pay | Admitting: Internal Medicine

## 2016-09-15 DIAGNOSIS — Z1231 Encounter for screening mammogram for malignant neoplasm of breast: Secondary | ICD-10-CM | POA: Diagnosis not present

## 2016-09-16 DIAGNOSIS — Z961 Presence of intraocular lens: Secondary | ICD-10-CM | POA: Diagnosis not present

## 2016-09-16 DIAGNOSIS — H2512 Age-related nuclear cataract, left eye: Secondary | ICD-10-CM | POA: Diagnosis not present

## 2016-09-16 DIAGNOSIS — H01006 Unspecified blepharitis left eye, unspecified eyelid: Secondary | ICD-10-CM | POA: Diagnosis not present

## 2016-09-16 DIAGNOSIS — H01003 Unspecified blepharitis right eye, unspecified eyelid: Secondary | ICD-10-CM | POA: Diagnosis not present

## 2016-10-07 DIAGNOSIS — I2510 Atherosclerotic heart disease of native coronary artery without angina pectoris: Secondary | ICD-10-CM | POA: Diagnosis not present

## 2016-10-07 DIAGNOSIS — I10 Essential (primary) hypertension: Secondary | ICD-10-CM | POA: Diagnosis not present

## 2016-10-07 DIAGNOSIS — E7800 Pure hypercholesterolemia, unspecified: Secondary | ICD-10-CM | POA: Diagnosis not present

## 2016-10-07 DIAGNOSIS — G479 Sleep disorder, unspecified: Secondary | ICD-10-CM | POA: Diagnosis not present

## 2016-10-07 DIAGNOSIS — N63 Unspecified lump in unspecified breast: Secondary | ICD-10-CM | POA: Diagnosis not present

## 2016-10-07 DIAGNOSIS — F339 Major depressive disorder, recurrent, unspecified: Secondary | ICD-10-CM | POA: Diagnosis not present

## 2016-10-07 DIAGNOSIS — N898 Other specified noninflammatory disorders of vagina: Secondary | ICD-10-CM | POA: Diagnosis not present

## 2016-10-07 DIAGNOSIS — D509 Iron deficiency anemia, unspecified: Secondary | ICD-10-CM | POA: Diagnosis not present

## 2016-10-07 DIAGNOSIS — Z79899 Other long term (current) drug therapy: Secondary | ICD-10-CM | POA: Diagnosis not present

## 2016-10-07 DIAGNOSIS — Z0000 Encounter for general adult medical examination without abnormal findings: Secondary | ICD-10-CM | POA: Diagnosis not present

## 2016-10-07 DIAGNOSIS — F419 Anxiety disorder, unspecified: Secondary | ICD-10-CM | POA: Diagnosis not present

## 2016-10-07 DIAGNOSIS — N904 Leukoplakia of vulva: Secondary | ICD-10-CM | POA: Diagnosis not present

## 2016-10-21 ENCOUNTER — Encounter: Payer: Medicare Other | Admitting: Registered Nurse

## 2016-10-23 DIAGNOSIS — N6011 Diffuse cystic mastopathy of right breast: Secondary | ICD-10-CM | POA: Diagnosis not present

## 2016-10-24 ENCOUNTER — Encounter: Payer: Self-pay | Admitting: Registered Nurse

## 2016-10-24 ENCOUNTER — Encounter: Payer: Medicare Other | Attending: Physical Medicine and Rehabilitation | Admitting: Registered Nurse

## 2016-10-24 VITALS — BP 124/81 | HR 87 | Resp 14

## 2016-10-24 DIAGNOSIS — G8929 Other chronic pain: Secondary | ICD-10-CM | POA: Diagnosis not present

## 2016-10-24 DIAGNOSIS — M7062 Trochanteric bursitis, left hip: Secondary | ICD-10-CM | POA: Diagnosis not present

## 2016-10-24 DIAGNOSIS — M797 Fibromyalgia: Secondary | ICD-10-CM | POA: Diagnosis not present

## 2016-10-24 DIAGNOSIS — Z5181 Encounter for therapeutic drug level monitoring: Secondary | ICD-10-CM | POA: Diagnosis not present

## 2016-10-24 DIAGNOSIS — M7061 Trochanteric bursitis, right hip: Secondary | ICD-10-CM

## 2016-10-24 DIAGNOSIS — R5382 Chronic fatigue, unspecified: Secondary | ICD-10-CM | POA: Insufficient documentation

## 2016-10-24 DIAGNOSIS — G894 Chronic pain syndrome: Secondary | ICD-10-CM

## 2016-10-24 DIAGNOSIS — M4302 Spondylolysis, cervical region: Secondary | ICD-10-CM

## 2016-10-24 DIAGNOSIS — M545 Low back pain, unspecified: Secondary | ICD-10-CM

## 2016-10-24 DIAGNOSIS — Z79899 Other long term (current) drug therapy: Secondary | ICD-10-CM

## 2016-10-24 MED ORDER — HYDROCODONE-ACETAMINOPHEN 5-325 MG PO TABS: ORAL_TABLET | ORAL | 0 refills | Status: DC

## 2016-10-24 NOTE — Progress Notes (Signed)
Subjective:    Patient ID: Kara Bush, female    DOB: [birth year], 76 y.o.   MRN: [id number]  HPI: [name] is a 76 year old female who returns for follow up for chronic pain and medication refill. She states her pain is located in her neck radiating into her right shoulder, mid back and bilateral hips. She ratesher pain 4. Her current exercise regime water exercises at the [facility] twotimes a week and walking short distances.   Pain Inventory Average Pain 5 Pain Right Now 4 My pain is aching  In the last 24 hours, has pain interfered with the following? General activity 2 Relation with others 0 Enjoyment of life 0 What TIME of day is your pain at its worst? morning Sleep (in general) Good  Pain is worse with: bending Pain improves with: n/a Relief from Meds: 5  Mobility walk without assistance Do you have any goals in this area?  no  Function retired Do you have any goals in this area?  no  Neuro/Psych anxiety  Prior Studies Any changes since last visit?  no  Physicians involved in your care Any changes since last visit?  no   Family History  Problem Relation Age of Onset  . Heart disease Father   . Heart attack Father     @ 33  . Cancer Paternal Aunt     BREAST  . Heart failure Mother     at 54  . Healthy Sister   . Healthy Brother   . Heart failure Maternal Grandmother   . Heart attack Maternal Grandfather   . Heart attack Paternal Grandmother     at 14   Social History   Social History  . Marital status: Divorced    Spouse name: N/A  . Number of children: 2  . Years of education: college    Occupational History  .  Retired   Social History Main Topics  . Smoking status: Former Smoker    Quit date: 06/29/1990  . Smokeless tobacco: Never Used  . Alcohol use No  . Drug use: No  . Sexual activity: Not Asked   Other Topics Concern  . None   Social History Narrative  . None   Past Surgical History:  Procedure  Laterality Date  . [place]  . CHOLECYSTECTOMY  1997  . CORONARY ANGIOPLASTY WITH STENT PLACEMENT  2006   to LAD, ATRETIC LIMA AT THAT TIME ALSO  . CORONARY ANGIOPLASTY WITH STENT PLACEMENT  2007   STENT TO PROX VG-DIAG, OTHER STENTS PATENT  . CORONARY ARTERY BYPASS GRAFT  1998   LIMA-LAD;VG-DIAG & RCA  . CORONARY STENT PLACEMENT  2005   TO LAD & LCX-STENTS,  . HERNIA REPAIR    . TUBAL LIGATION     BTL   Past Medical History:  Diagnosis Date  . Abdominal aortic aneurysm ([place])   . Anemia, improved 01/11/2013  . Basal cell carcinoma   . CAD (coronary artery disease)    hx CABG 1998, last cath 2007 with PCI and stenting to the proximal segment of the vein graft to the diagonal branch. DES Taxus was placed  . Cervicalgia   . Chest wall pain   . Chronic fatigue fibromyalgia syndrome   . Chronic pain syndrome   . Coronary artery disease   . Depression   . Fibromyalgia   . Fibromyalgia   . History of nuclear stress test 08/07/2011   lexiscan; no evidence  of ischemia or infarct; low risk   . Hyperlipidemia   . Hypertension   . Hypertension   . Obstructive sleep apnea   . OSA (obstructive sleep apnea)   . PAD (peripheral artery disease) ([place])   . Pes anserine bursitis   . Pulmonary embolism ([place]) 01/11/2013  . S/P resection of aortic aneurysm   . Trochanteric bursitis   . Ulcerative colitis   . Ulcerative colitis ([place])    BP 124/81   Pulse 87   Resp 14   SpO2 95%   Opioid Risk Score:   Fall Risk Score:  `1  Depression screen PHQ 2/9  Depression screen [facility] 2/9 08/21/2015 11/07/2014 05/25/2013  Decreased Interest 0 0 0  Down, Depressed, Hopeless 0 0 0  PHQ - 2 Score 0 0 0  Tired, decreased energy - 1 -  Change in appetite - 1 -  Feeling bad or failure about yourself  - 0 -  Trouble concentrating - 0 -  Moving slowly or fidgety/restless - 0 -  Suicidal thoughts - 0 -     Review of Systems  Constitutional: Negative.   HENT: Negative.     Eyes: Negative.   Respiratory: Negative.   Cardiovascular: Negative.   Gastrointestinal: Negative.   Endocrine: Negative.   Genitourinary: Negative.   Musculoskeletal: Negative.   Skin: Negative.   Allergic/Immunologic: Negative.   Neurological: Negative.   Hematological: Negative.   Psychiatric/Behavioral: Negative.   All other systems reviewed and are negative.      Objective:   Physical Exam  Constitutional: She is oriented to person, place, and time. She appears well-developed and well-nourished.  HENT:  Head: Normocephalic and atraumatic.  Neck: Normal range of motion. Neck supple.  Cardiovascular: Normal rate and regular rhythm.   Pulmonary/Chest: Effort normal and breath sounds normal.  Musculoskeletal:  Normal Muscle Bulk and Muscle Testing Reveals: Upper Extremities: Full ROM and Muscle Strength 5/5 Thoracic Paraspinal Tenderness: T-7-T-9 Right Greater Trochanter Tenderness Lower Extremities: Full ROM and Muscle Strength 5/5 Arises from chair with ease Narrow Based Gait  Neurological: She is alert and oriented to person, place, and time.  Skin: Skin is warm and dry.  Psychiatric: She has a normal mood and affect.  Nursing note and vitals reviewed.         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 10/24/2016 2. Fibromyalgia: Continue Pool therapyand Home Exercise Program. 10/24/2016 3. Bilateral intermittent hand numbness: Carpal tunnel syndrome versus C6 radiculopathy mild: Continue to Monitor. 10/24/2016 4. LBP: Refilled: Norco 5/325mg  # 90 pills--use one pill tid for pain. A second prescription was given for the following month. 10/24/2016 We will continue the opioid monitoring program, this consists of regular clinic visits, examinations, urine drug screen, pill counts as well as use of [place] Controlled Substance Reporting  System.  5. Muscle Spasm: Continue Robaxin. 10/24/2016  20 minutes of face to face patient care time was spent during this visit. All questions were encouraged and answered.   F/U in 2 months

## 2016-10-27 ENCOUNTER — Telehealth: Payer: Self-pay | Admitting: Registered Nurse

## 2016-10-27 NOTE — Telephone Encounter (Signed)
On 10/27/2016 the [place] was reviewed no conflict was seen on the [place] with multiple prescribers. Ms. Pollino has a signed narcotic contract with our office. If there were any discrepancies this would have been reported to her physician.

## 2016-10-28 LAB — TOXASSURE SELECT,+ANTIDEPR,UR

## 2016-10-30 NOTE — Progress Notes (Signed)
Office Visit Note  Patient: Kara Bush             Date of Birth: 1940-11-07           MRN: [id number]             PCP: Gerrit Heck, MD Referring: Leighton Ruff, MD Visit Date: 11/11/2016 Occupation: @GUAROCC @    Subjective:  Generalized pain   History of Present Illness: Kara Bush is a 76 y.o. female with history of fibromyalgia and osteoarthritis. According to patient she continues to have some generalized aches and pains from fibromyalgia. She's been having some discomfort in her bilateral trochanteric bursa. She has some discomfort over right lateral epicondyle area. She describes her pain on scale of 0-10 4-5 and fatigue but 5. She has to take hydrocodone in the morning and it takes an hour for her to start functioning again.  Activities of Daily Living: Patient reports morning stiffness for 5 minute.   Patient Reports nocturnal pain.  Difficulty dressing/grooming: Denies Difficulty climbing stairs: Reports Difficulty getting out of chair: Denies Difficulty using hands for taps, buttons, cutlery, and/or writing: Denies   Review of Systems  Constitutional: Positive for fatigue. Negative for night sweats, weight gain, weight loss and weakness.  HENT: Negative for mouth sores, trouble swallowing, trouble swallowing, mouth dryness and nose dryness.   Eyes: Positive for dryness. Negative for pain, redness and visual disturbance.  Respiratory: Negative for cough, shortness of breath and difficulty breathing.   Cardiovascular: Negative for chest pain, palpitations, hypertension, irregular heartbeat and swelling in legs/feet.  Gastrointestinal: Negative for blood in stool, constipation and diarrhea.  Endocrine: Negative for increased urination.  Genitourinary: Negative for vaginal dryness.  Musculoskeletal: Positive for arthralgias, joint pain, myalgias, morning stiffness and myalgias. Negative for joint swelling, muscle weakness and muscle  tenderness.  Skin: Negative for color change, rash, hair loss, skin tightness, ulcers and sensitivity to sunlight.  Allergic/Immunologic: Negative for susceptible to infections.  Neurological: Negative for dizziness, memory loss and night sweats.  Hematological: Negative for swollen glands.  Psychiatric/Behavioral: Positive for depressed mood and sleep disturbance. The patient is nervous/anxious.     PMFS History:  Patient Active Problem List   Diagnosis Date Noted  . 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 Trochanteric bursitis 11/07/2014  . Sacro-iliac pain 11/07/2014  . Low back pain 11/07/2013  . Hepatitis 04/20/2013  . Screening for STD (sexually transmitted disease) 04/20/2013  . Recurrent UTI 04/20/2013  . Abdominal aortic aneurysm ([place])   . Ulcerative colitis ([place])   . Obstructive sleep apnea   . Fibromyalgia   . Coronary artery disease   . Basal cell carcinoma   . Anemia, improved 01/11/2013  . Ascending aortic aneurysm, 4.1 cm 01/11/2013  . 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 Chronic periscapular pain 10/15/2011  . Chest wall pain   . S/P resection of aortic aneurysm, 1998 prior to CABG   . PAD (peripheral artery disease), aortic-iliac bypass 1991, mild carotid bruits   . OSA (obstructive sleep apnea)   . Chronic fatigue fibromyalgia syndrome   . Hyperlipidemia   . Ulcerative colitis   . Depression   . CAD (coronary artery disease), CABG 1998, STENTS MULTIPLE SITES SINCE.    Marland Kitchen Hypertension     Past Medical History:  Diagnosis Date  . Abdominal aortic aneurysm ([place])   . Anemia, improved 01/11/2013  . Basal cell carcinoma   . CAD (coronary artery disease)    hx CABG 1998, last  cath 2007 with PCI and stenting to the proximal segment of the vein graft to the diagonal branch. DES Taxus was placed  . Cervicalgia   . Chest wall pain   . Chronic fatigue fibromyalgia syndrome   . Chronic pain syndrome   . Coronary artery disease   . Depression   .  Fibromyalgia   . Fibromyalgia   . History of nuclear stress test 08/07/2011   lexiscan; no evidence of ischemia or infarct; low risk   . Hyperlipidemia   . Hypertension   . Hypertension   . Obstructive sleep apnea   . OSA (obstructive sleep apnea)   . PAD (peripheral artery disease) ([place])   . Pes anserine bursitis   . Pulmonary embolism ([place]) 01/11/2013  . S/P resection of aortic aneurysm   . Trochanteric bursitis   . Ulcerative colitis   . Ulcerative colitis ([place])     Family History  Problem Relation Age of Onset  . Heart disease Father   . Heart attack Father     @ 21  . Cancer Paternal Aunt     BREAST  . Heart failure Mother     at 80  . Healthy Sister   . Healthy Brother   . Heart failure Maternal Grandmother   . Heart attack Maternal Grandfather   . Heart attack Paternal Grandmother     at 48   Past Surgical History:  Procedure Laterality Date  . [place]  . CHOLECYSTECTOMY  1997  . CORONARY ANGIOPLASTY WITH STENT PLACEMENT  2006   to LAD, ATRETIC LIMA AT THAT TIME ALSO  . CORONARY ANGIOPLASTY WITH STENT PLACEMENT  2007   STENT TO PROX VG-DIAG, OTHER STENTS PATENT  . CORONARY ARTERY BYPASS GRAFT  1998   LIMA-LAD;VG-DIAG & RCA  . CORONARY STENT PLACEMENT  2005   TO LAD & LCX-STENTS,  . HERNIA REPAIR    . TUBAL LIGATION     BTL   Social History   Social History Narrative  . No narrative on file     Objective: Vital Signs: BP 120/70   Pulse 78   Resp 14   Wt 183 lb (83 kg)   BMI 31.41 kg/m    Physical Exam  Constitutional: She is oriented to person, place, and time. She appears well-developed and well-nourished.  HENT:  Head: Normocephalic and atraumatic.  Eyes: Conjunctivae and EOM are normal.  Neck: Normal range of motion.  Cardiovascular: Normal rate, regular rhythm, normal heart sounds and intact distal pulses.   Pulmonary/Chest: Effort normal and breath sounds normal.  Abdominal: Soft. Bowel sounds are normal.    Lymphadenopathy:    She has no cervical adenopathy.  Neurological: She is alert and oriented to person, place, and time.  Skin: Skin is warm and dry. Capillary refill takes less than 2 seconds.  Psychiatric: She has a normal mood and affect. Her behavior is normal.  Nursing note and vitals reviewed.    Musculoskeletal Exam: C-spine some limitation with range of motion thoracic and lumbar spine good range of motion. Shoulder joints elbow joints wrist joints MCPs PIPs DIPs with good range of motion with no synovitis, she has tenderness on palpation over bilateral trochanteric bursa area. Hip joints knee joints ankles MTPs PIPs are good range of motion with no synovitis fibromyalgia tender points with 12 out of 18 positive.  CDAI Exam: No CDAI exam completed.    Investigation: Findings:   Labs reviewed from September 2011 show CRP normal;  sed rate normal; RF negative; CK normal; CMP normal; CBC with diff normal; anti CCP antibody elevated 250; ANA negative; TSH normal.       Imaging: No results found.  Speciality Comments: No specialty comments available.    Procedures:  No procedures performed Allergies: Atenolol; Bactrim [sulfamethoxazole-trimethoprim]; Cymbalta [duloxetine hcl]; Lyrica [pregabalin]; and Penicillins   Assessment / Plan:     Visit Diagnoses: Fibromyalgia: Active disease with generalized pain and discomfort.  Primary insomnia: Good sleep hygiene was discussed.  Cervical spondylolysis: She continues to have some discomfort in her C-spine  Sacro-iliac pain: SI joint pain is doing better.  Chronic periscapular pain on both sides: She's been trying to do some exercise at water aerobics.  Trochanteric bursitis of both hips ITB and exercise were demonstrated and discussed today  Her other medical problems are listed as follows:  OSA (obstructive sleep apnea)  Coronary artery disease status post CABG  Essential hypertension  Mixed  hyperlipidemia  History of ulcerative colitis  History of depression     Orders: No orders of the defined types were placed in this encounter.  No orders of the defined types were placed in this encounter.   Face-to-face time spent with patient was 30 minutes. 50% of time was spent in counseling and coordination of care.  Follow-Up Instructions: Return in about 1 year (around 11/11/2017) for FMS.   Bo Merino, MD  Note - This record has been created using Editor, commissioning.  Chart creation errors have been sought, but may not always  have been located. Such creation errors do not reflect on  the standard of medical care.

## 2016-11-11 ENCOUNTER — Ambulatory Visit (INDEPENDENT_AMBULATORY_CARE_PROVIDER_SITE_OTHER): Payer: Medicare Other | Admitting: Rheumatology

## 2016-11-11 ENCOUNTER — Encounter: Payer: Self-pay | Admitting: Rheumatology

## 2016-11-11 VITALS — BP 120/70 | HR 78 | Resp 14 | Wt 183.0 lb

## 2016-11-11 DIAGNOSIS — G4733 Obstructive sleep apnea (adult) (pediatric): Secondary | ICD-10-CM

## 2016-11-11 DIAGNOSIS — M7061 Trochanteric bursitis, right hip: Secondary | ICD-10-CM | POA: Diagnosis not present

## 2016-11-11 DIAGNOSIS — Z8659 Personal history of other mental and behavioral disorders: Secondary | ICD-10-CM | POA: Diagnosis not present

## 2016-11-11 DIAGNOSIS — M4302 Spondylolysis, cervical region: Secondary | ICD-10-CM | POA: Diagnosis not present

## 2016-11-11 DIAGNOSIS — M533 Sacrococcygeal disorders, not elsewhere classified: Secondary | ICD-10-CM

## 2016-11-11 DIAGNOSIS — F5101 Primary insomnia: Secondary | ICD-10-CM

## 2016-11-11 DIAGNOSIS — Z8719 Personal history of other diseases of the digestive system: Secondary | ICD-10-CM | POA: Diagnosis not present

## 2016-11-11 DIAGNOSIS — E782 Mixed hyperlipidemia: Secondary | ICD-10-CM

## 2016-11-11 DIAGNOSIS — M797 Fibromyalgia: Secondary | ICD-10-CM

## 2016-11-11 DIAGNOSIS — I10 Essential (primary) hypertension: Secondary | ICD-10-CM

## 2016-11-11 DIAGNOSIS — G8929 Other chronic pain: Secondary | ICD-10-CM

## 2016-11-11 DIAGNOSIS — M25512 Pain in left shoulder: Secondary | ICD-10-CM

## 2016-11-11 DIAGNOSIS — M25511 Pain in right shoulder: Secondary | ICD-10-CM

## 2016-11-11 DIAGNOSIS — I2510 Atherosclerotic heart disease of native coronary artery without angina pectoris: Secondary | ICD-10-CM | POA: Diagnosis not present

## 2016-11-11 DIAGNOSIS — M7062 Trochanteric bursitis, left hip: Secondary | ICD-10-CM

## 2016-11-13 ENCOUNTER — Telehealth: Payer: Self-pay | Admitting: Registered Nurse

## 2016-11-13 NOTE — Telephone Encounter (Signed)
UDS was obtained on 10/24/2016, it was consistent.

## 2016-11-19 DIAGNOSIS — C44519 Basal cell carcinoma of skin of other part of trunk: Secondary | ICD-10-CM | POA: Diagnosis not present

## 2016-11-19 DIAGNOSIS — D1801 Hemangioma of skin and subcutaneous tissue: Secondary | ICD-10-CM | POA: Diagnosis not present

## 2016-11-19 DIAGNOSIS — D3617 Benign neoplasm of peripheral nerves and autonomic nervous system of trunk, unspecified: Secondary | ICD-10-CM | POA: Diagnosis not present

## 2016-11-19 DIAGNOSIS — L918 Other hypertrophic disorders of the skin: Secondary | ICD-10-CM | POA: Diagnosis not present

## 2016-11-19 DIAGNOSIS — D485 Neoplasm of uncertain behavior of skin: Secondary | ICD-10-CM | POA: Diagnosis not present

## 2016-11-19 DIAGNOSIS — D225 Melanocytic nevi of trunk: Secondary | ICD-10-CM | POA: Diagnosis not present

## 2016-11-19 DIAGNOSIS — L821 Other seborrheic keratosis: Secondary | ICD-10-CM | POA: Diagnosis not present

## 2016-11-19 DIAGNOSIS — Z85828 Personal history of other malignant neoplasm of skin: Secondary | ICD-10-CM | POA: Diagnosis not present

## 2016-11-19 DIAGNOSIS — Q8500 Neurofibromatosis, unspecified: Secondary | ICD-10-CM | POA: Diagnosis not present

## 2016-11-19 DIAGNOSIS — D216 Benign neoplasm of connective and other soft tissue of trunk, unspecified: Secondary | ICD-10-CM | POA: Diagnosis not present

## 2016-11-25 DIAGNOSIS — Z85828 Personal history of other malignant neoplasm of skin: Secondary | ICD-10-CM | POA: Diagnosis not present

## 2016-11-25 DIAGNOSIS — C44519 Basal cell carcinoma of skin of other part of trunk: Secondary | ICD-10-CM | POA: Diagnosis not present

## 2016-12-18 ENCOUNTER — Telehealth: Payer: Self-pay | Admitting: Registered Nurse

## 2016-12-18 NOTE — Telephone Encounter (Signed)
On 12/18/2016 the  [facility] was reviewed no conflict was seen on the [place] with multiple prescribers. Kara Bush has a signed narcotic contract with our office. If there were any discrepancies this would have been reported to/her physician.

## 2016-12-19 ENCOUNTER — Encounter: Payer: Self-pay | Admitting: Registered Nurse

## 2016-12-19 ENCOUNTER — Encounter: Payer: Medicare Other | Attending: Physical Medicine and Rehabilitation | Admitting: Registered Nurse

## 2016-12-19 VITALS — BP 145/78 | HR 86 | Resp 14

## 2016-12-19 DIAGNOSIS — Z5181 Encounter for therapeutic drug level monitoring: Secondary | ICD-10-CM | POA: Diagnosis not present

## 2016-12-19 DIAGNOSIS — Z79899 Other long term (current) drug therapy: Secondary | ICD-10-CM | POA: Diagnosis not present

## 2016-12-19 DIAGNOSIS — M545 Low back pain, unspecified: Secondary | ICD-10-CM

## 2016-12-19 DIAGNOSIS — M797 Fibromyalgia: Secondary | ICD-10-CM | POA: Diagnosis not present

## 2016-12-19 DIAGNOSIS — M25511 Pain in right shoulder: Secondary | ICD-10-CM | POA: Diagnosis not present

## 2016-12-19 DIAGNOSIS — R5382 Chronic fatigue, unspecified: Secondary | ICD-10-CM | POA: Diagnosis not present

## 2016-12-19 DIAGNOSIS — I2510 Atherosclerotic heart disease of native coronary artery without angina pectoris: Secondary | ICD-10-CM | POA: Diagnosis not present

## 2016-12-19 DIAGNOSIS — M4302 Spondylolysis, cervical region: Secondary | ICD-10-CM | POA: Insufficient documentation

## 2016-12-19 DIAGNOSIS — G894 Chronic pain syndrome: Secondary | ICD-10-CM

## 2016-12-19 DIAGNOSIS — G8929 Other chronic pain: Secondary | ICD-10-CM | POA: Diagnosis not present

## 2016-12-19 MED ORDER — HYDROCODONE-ACETAMINOPHEN 5-325 MG PO TABS: ORAL_TABLET | ORAL | 0 refills | Status: DC

## 2016-12-19 NOTE — Progress Notes (Signed)
Subjective:    Patient ID: Kara Bush, female    DOB: 1940/11/15, 76 y.o.   MRN: [id number]  HPI: Kara Bush is a 76 year old female who returns for follow up appointment for chronic pain and medication refill. She states her pain is located in her right shoulder and lower back. She ratesher pain 4. Her current exercise regime water exercises at the [facility] twice a week and walking short distances. Last UDS was performed on 10/24/16, it was consistent.  Pain Inventory Average Pain 4 Pain Right Now 4 My pain is aching  In the last 24 hours, has pain interfered with the following? General activity 2 Relation with others 0 Enjoyment of life 0 What TIME of day is your pain at its worst? daytime Sleep (in general) Good  Pain is worse with: unsure Pain improves with: medication Relief from Meds: 5  Mobility walk without assistance Do you have any goals in this area?  no  Function retired Do you have any goals in this area?  no  Neuro/Psych depression anxiety  Prior Studies Any changes since last visit?  no  Physicians involved in your care Any changes since last visit?  no   Family History  Problem Relation Age of Onset  . Heart disease Father   . Heart attack Father        @ 75  . Cancer Paternal Aunt        BREAST  . Heart failure Mother        at 63  . Healthy Sister   . Healthy Brother   . Heart failure Maternal Grandmother   . Heart attack Maternal Grandfather   . Heart attack Paternal Grandmother        at 17   Social History   Social History  . Marital status: Divorced    Spouse name: N/A  . Number of children: 2  . Years of education: college    Occupational History  .  Retired   Social History Main Topics  . Smoking status: Former Smoker    Quit date: 06/29/1990  . Smokeless tobacco: Never Used  . Alcohol use No  . Drug use: No  . Sexual activity: Not Asked   Other Topics Concern  . None   Social History  Narrative  . None   Past Surgical History:  Procedure Laterality Date  . [place]  . CHOLECYSTECTOMY  1997  . CORONARY ANGIOPLASTY WITH STENT PLACEMENT  2006   to LAD, ATRETIC LIMA AT THAT TIME ALSO  . CORONARY ANGIOPLASTY WITH STENT PLACEMENT  2007   STENT TO PROX VG-DIAG, OTHER STENTS PATENT  . CORONARY ARTERY BYPASS GRAFT  1998   LIMA-LAD;VG-DIAG & RCA  . CORONARY STENT PLACEMENT  2005   TO LAD & LCX-STENTS,  . HERNIA REPAIR    . TUBAL LIGATION     BTL   Past Medical History:  Diagnosis Date  . Abdominal aortic aneurysm ([place])   . Anemia, improved 01/11/2013  . Basal cell carcinoma   . CAD (coronary artery disease)    hx CABG 1998, last cath 2007 with PCI and stenting to the proximal segment of the vein graft to the diagonal branch. DES Taxus was placed  . Cervicalgia   . Chest wall pain   . Chronic fatigue fibromyalgia syndrome   . Chronic pain syndrome   . Coronary artery disease   . Depression   . Fibromyalgia   .  Fibromyalgia   . History of nuclear stress test 08/07/2011   lexiscan; no evidence of ischemia or infarct; low risk   . Hyperlipidemia   . Hypertension   . Hypertension   . Obstructive sleep apnea   . OSA (obstructive sleep apnea)   . PAD (peripheral artery disease) ([place])   . Pes anserine bursitis   . Pulmonary embolism ([place]) 01/11/2013  . S/P resection of aortic aneurysm   . Trochanteric bursitis   . Ulcerative colitis   . Ulcerative colitis ([place])    BP (!) 145/78 (BP Location: Right Arm, Patient Position: Sitting, Cuff Size: Large)   Pulse 86   Resp 14   SpO2 95%   Opioid Risk Score:   Fall Risk Score:  `1  Depression screen PHQ 2/9  Depression screen [facility] 2/9 08/21/2015 11/07/2014 05/25/2013  Decreased Interest 0 0 0  Down, Depressed, Hopeless 0 0 0  PHQ - 2 Score 0 0 0  Tired, decreased energy - 1 -  Change in appetite - 1 -  Feeling bad or failure about yourself  - 0 -  Trouble concentrating - 0 -  Moving slowly  or fidgety/restless - 0 -  Suicidal thoughts - 0 -    Review of Systems  Constitutional: Negative.   HENT: Negative.   Eyes: Negative.   Respiratory: Negative.   Cardiovascular: Negative.   Gastrointestinal: Negative.   Endocrine: Negative.   Genitourinary: Negative.   Musculoskeletal: Positive for back pain and neck pain.  Skin: Negative.   Allergic/Immunologic: Negative.   Neurological: Negative.   Hematological: Negative.   Psychiatric/Behavioral: Negative.   All other systems reviewed and are negative.      Objective:   Physical Exam  Constitutional: She is oriented to person, place, and time. She appears well-developed and well-nourished.  HENT:  Head: Normocephalic and atraumatic.  Neck: Normal range of motion. Neck supple.  Cardiovascular: Normal rate and regular rhythm.   Pulmonary/Chest: Effort normal and breath sounds normal.  Musculoskeletal:  Normal Muscle Bulk and Muscle Testing Reveals: Upper Extremities: Full ROM and Muscle Strength 5/5 Right: AC Joint Tenderness Thoracic Paraspinal Tenderness: T-1- T-3 Lumbar Paraspinal Tenderness: L-3-L-5 Mainly Right Side Lower Extremities: Full ROM and Muscle Strength 5/5 Arises from Chair with ease Narrow Based Gait  Neurological: She is alert and oriented to person, place, and time.  Skin: Skin is warm and dry.  Psychiatric: She has a normal mood and affect.          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 12/19/2016 2. Fibromyalgia: Continue Pool therapyand Home Exercise Program. 12/19/2016 3. Bilateral intermittent hand numbness: Carpal tunnel syndrome versus C6 radiculopathy mild: Continue to Monitor. 12/19/2016 4. LBP: Refilled: Norco 5/325mg  # 90 pills--use one pill tid for pain. A second prescription was given for the following month. 12/19/2016 We will continue the opioid  monitoring program, this consists of regular clinic visits, examinations, urine drug screen, pill counts as well as use of [place] Controlled Substance Reporting System.  5. Muscle Spasm: Continue Robaxin. 12/19/2016  15 minutes of face to face patient care time was spent during this visit. All questions were encouraged and answered.    F/U in 2 months

## 2017-01-17 ENCOUNTER — Other Ambulatory Visit (HOSPITAL_COMMUNITY): Payer: Self-pay | Admitting: Internal Medicine

## 2017-02-17 ENCOUNTER — Encounter: Payer: Self-pay | Admitting: Registered Nurse

## 2017-02-17 ENCOUNTER — Encounter: Payer: Medicare Other | Attending: Physical Medicine and Rehabilitation | Admitting: Registered Nurse

## 2017-02-17 VITALS — BP 150/80 | HR 90

## 2017-02-17 DIAGNOSIS — M25511 Pain in right shoulder: Secondary | ICD-10-CM | POA: Diagnosis not present

## 2017-02-17 DIAGNOSIS — M545 Low back pain, unspecified: Secondary | ICD-10-CM

## 2017-02-17 DIAGNOSIS — Z79899 Other long term (current) drug therapy: Secondary | ICD-10-CM | POA: Insufficient documentation

## 2017-02-17 DIAGNOSIS — M797 Fibromyalgia: Secondary | ICD-10-CM | POA: Diagnosis not present

## 2017-02-17 DIAGNOSIS — G894 Chronic pain syndrome: Secondary | ICD-10-CM

## 2017-02-17 DIAGNOSIS — I2510 Atherosclerotic heart disease of native coronary artery without angina pectoris: Secondary | ICD-10-CM | POA: Diagnosis not present

## 2017-02-17 DIAGNOSIS — Z5181 Encounter for therapeutic drug level monitoring: Secondary | ICD-10-CM

## 2017-02-17 DIAGNOSIS — R5382 Chronic fatigue, unspecified: Secondary | ICD-10-CM | POA: Diagnosis not present

## 2017-02-17 DIAGNOSIS — M4302 Spondylolysis, cervical region: Secondary | ICD-10-CM | POA: Diagnosis not present

## 2017-02-17 DIAGNOSIS — G8929 Other chronic pain: Secondary | ICD-10-CM | POA: Diagnosis not present

## 2017-02-17 MED ORDER — HYDROCODONE-ACETAMINOPHEN 5-325 MG PO TABS: ORAL_TABLET | ORAL | 0 refills | Status: DC

## 2017-02-17 NOTE — Progress Notes (Signed)
Subjective:    Patient ID: Kara Bush, female    DOB: [birth year], 76 y.o.   MRN: [id number]  HPI: Ms. JENNELLE Bush is a 76year old female who returns for follow up appointment for chronic pain and medication refill. She states her pain is located in her right shoulder and mid- lower back. She did not rate her pain scale today. Her current exercise regime is water exercises at the [facility] three times  a week and walking short distances.  Last UDS was performed on 10/24/16, it was consistent.  Pain Inventory Average Pain 0 Pain Right Now 0 My pain is aching  In the last 24 hours, has pain interfered with the following? General activity 2 Relation with others 0 Enjoyment of life 2 What TIME of day is your pain at its worst? daytime Sleep (in general) Good  Pain is worse with: bending and some activites Pain improves with: medication Relief from Meds: 6  Mobility walk without assistance  Function Do you have any goals in this area?  no  Neuro/Psych depression  Prior Studies Any changes since last visit?  no  Physicians involved in your care Any changes since last visit?  no   Family History  Problem Relation Age of Onset  . Heart disease Father   . Heart attack Father        @ 51  . Cancer Paternal Aunt        BREAST  . Heart failure Mother        at 85  . Healthy Sister   . Healthy Brother   . Heart failure Maternal Grandmother   . Heart attack Maternal Grandfather   . Heart attack Paternal Grandmother        at 27   Social History   Social History  . Marital status: Divorced    Spouse name: N/A  . Number of children: 2  . Years of education: college    Occupational History  .  Retired   Social History Main Topics  . Smoking status: Former Smoker    Quit date: 06/29/1990  . Smokeless tobacco: Never Used  . Alcohol use No  . Drug use: No  . Sexual activity: Not Asked   Other Topics Concern  . None   Social History Narrative  .  None   Past Surgical History:  Procedure Laterality Date  . [place]  . CHOLECYSTECTOMY  1997  . CORONARY ANGIOPLASTY WITH STENT PLACEMENT  2006   to LAD, ATRETIC LIMA AT THAT TIME ALSO  . CORONARY ANGIOPLASTY WITH STENT PLACEMENT  2007   STENT TO PROX VG-DIAG, OTHER STENTS PATENT  . CORONARY ARTERY BYPASS GRAFT  1998   LIMA-LAD;VG-DIAG & RCA  . CORONARY STENT PLACEMENT  2005   TO LAD & LCX-STENTS,  . HERNIA REPAIR    . TUBAL LIGATION     BTL   Past Medical History:  Diagnosis Date  . Abdominal aortic aneurysm ([place])   . Anemia, improved 01/11/2013  . Basal cell carcinoma   . CAD (coronary artery disease)    hx CABG 1998, last cath 2007 with PCI and stenting to the proximal segment of the vein graft to the diagonal branch. DES Taxus was placed  . Cervicalgia   . Chest wall pain   . Chronic fatigue fibromyalgia syndrome   . Chronic pain syndrome   . Coronary artery disease   . Depression   . Fibromyalgia   .  Fibromyalgia   . History of nuclear stress test 08/07/2011   lexiscan; no evidence of ischemia or infarct; low risk   . Hyperlipidemia   . Hypertension   . Hypertension   . Obstructive sleep apnea   . OSA (obstructive sleep apnea)   . PAD (peripheral artery disease) ([place])   . Pes anserine bursitis   . Pulmonary embolism ([place]) 01/11/2013  . S/P resection of aortic aneurysm   . Trochanteric bursitis   . Ulcerative colitis   . Ulcerative colitis ([place])    BP (!) 150/80 (BP Location: Right Arm, Patient Position: Sitting, Cuff Size: Normal)   Pulse 90   SpO2 96%   Opioid Risk Score:   Fall Risk Score:  `1  Depression screen PHQ 2/9  Depression screen [facility] 2/9 08/21/2015 11/07/2014 05/25/2013  Decreased Interest 0 0 0  Down, Depressed, Hopeless 0 0 0  PHQ - 2 Score 0 0 0  Tired, decreased energy - 1 -  Change in appetite - 1 -  Feeling bad or failure about yourself  - 0 -  Trouble concentrating - 0 -  Moving slowly or fidgety/restless - 0  -  Suicidal thoughts - 0 -     Review of Systems  Constitutional: Negative.   HENT: Negative.   Eyes: Negative.   Respiratory: Negative.   Cardiovascular: Negative.   Gastrointestinal: Negative.   Endocrine: Negative.   Genitourinary: Negative.   Musculoskeletal: Negative.   Skin: Negative.   Allergic/Immunologic: Negative.   Neurological: Negative.   Hematological: Negative.   Psychiatric/Behavioral: Negative.   All other systems reviewed and are negative.      Objective:   Physical Exam  Constitutional: She is oriented to person, place, and time. She appears well-developed and well-nourished.  HENT:  Head: Normocephalic and atraumatic.  Neck: Normal range of motion. Neck supple.  Cardiovascular: Normal rate and regular rhythm.   Pulmonary/Chest: Effort normal and breath sounds normal.  Musculoskeletal:  Normal Muscle Bulk and Muscle Testing Reveals: Upper Extremities: Full ROM and Muscle Strength 5/5 Thoracic Paraspinal Tenderness: T-7-T-9 Lumbar Paraspinal Tenderness: L-3-L-5 Lower Extremities: Full ROM and Muscle Strength 5/5 Arises from Table with ease Narrow Based Gait   Neurological: She is alert and oriented to person, place, and time.  Skin: Skin is warm and dry.  Psychiatric: She has a normal mood and affect.  Nursing note and vitals reviewed.         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 02/17/2017 2. Fibromyalgia: Continue Pool therapyand Home Exercise Program. 02/17/2017 3. Bilateral intermittent hand numbness: Carpal tunnel syndrome versus C6 radiculopathy mild: Continue to Monitor. 02/17/2017 4. LBP: Refilled: Norco 5/325mg  # 90 pills--use one pill tid for pain. A second prescription was given for the following month. 02/17/2017 We will continue the opioid monitoring program, this consists of regular clinic visits,  examinations, urine drug screen, pill counts as well as use of [place] Controlled Substance Reporting System.  5. Muscle Spasm: ContinueRobaxin. 02/17/2017  20 minutes of face to face patient care time was spent during this visit. All questions were encouraged and answered.   F/U in 2 months

## 2017-02-26 ENCOUNTER — Other Ambulatory Visit: Payer: Self-pay | Admitting: Internal Medicine

## 2017-04-21 ENCOUNTER — Encounter: Payer: Self-pay | Admitting: Physical Medicine & Rehabilitation

## 2017-04-21 ENCOUNTER — Encounter: Payer: Medicare Other | Attending: Physical Medicine and Rehabilitation | Admitting: Physical Medicine & Rehabilitation

## 2017-04-21 VITALS — BP 143/83 | HR 98 | Resp 14

## 2017-04-21 DIAGNOSIS — M545 Low back pain: Secondary | ICD-10-CM | POA: Diagnosis not present

## 2017-04-21 DIAGNOSIS — I2510 Atherosclerotic heart disease of native coronary artery without angina pectoris: Secondary | ICD-10-CM | POA: Diagnosis not present

## 2017-04-21 DIAGNOSIS — M797 Fibromyalgia: Secondary | ICD-10-CM | POA: Insufficient documentation

## 2017-04-21 DIAGNOSIS — M4302 Spondylolysis, cervical region: Secondary | ICD-10-CM | POA: Insufficient documentation

## 2017-04-21 DIAGNOSIS — Z5181 Encounter for therapeutic drug level monitoring: Secondary | ICD-10-CM | POA: Insufficient documentation

## 2017-04-21 DIAGNOSIS — G9332 Myalgic encephalomyelitis/chronic fatigue syndrome: Secondary | ICD-10-CM

## 2017-04-21 DIAGNOSIS — Z79899 Other long term (current) drug therapy: Secondary | ICD-10-CM | POA: Insufficient documentation

## 2017-04-21 DIAGNOSIS — G894 Chronic pain syndrome: Secondary | ICD-10-CM

## 2017-04-21 DIAGNOSIS — R5382 Chronic fatigue, unspecified: Secondary | ICD-10-CM

## 2017-04-21 MED ORDER — HYDROCODONE-ACETAMINOPHEN 5-325 MG PO TABS
ORAL_TABLET | ORAL | 0 refills | Status: DC
Start: 2017-04-21 — End: 2017-04-21

## 2017-04-21 MED ORDER — HYDROCODONE-ACETAMINOPHEN 5-325 MG PO TABS: ORAL_TABLET | ORAL | 0 refills | Status: DC

## 2017-04-21 NOTE — Progress Notes (Signed)
Subjective:    Patient ID: Kara Bush, female    DOB: 04/03/1941, 76 y.o.   MRN: [id number]  HPI   Mrs. Malstrom is here in follow up of her chronic pain. Her pain levels are stable from the summer. She remains active at the [facility] with water therapies and with her HEP. She has familiar pain in the inner right scapula and neck. She has a recumbent bike at home.   Overall she's tolerating her pain with hydrocodone. She has been compliant with our program.     Pain Inventory Average Pain 4 Pain Right Now 4 My pain is aching  In the last 24 hours, has pain interfered with the following? General activity 2 Relation with others 0 Enjoyment of life 2 What TIME of day is your pain at its worst? daytime Sleep (in general) Good  Pain is worse with: bending and some activites Pain improves with: medication Relief from Meds: 6  Mobility walk without assistance  Function Do you have any goals in this area?  no  Neuro/Psych depression  Prior Studies Any changes since last visit?  no  Physicians involved in your care Any changes since last visit?  no   Family History  Problem Relation Age of Onset  . Heart disease Father   . Heart attack Father        @ 68  . Cancer Paternal Aunt        BREAST  . Heart failure Mother        at 66  . Healthy Sister   . Healthy Brother   . Heart failure Maternal Grandmother   . Heart attack Maternal Grandfather   . Heart attack Paternal Grandmother        at 14   Social History   Social History  . Marital status: Divorced    Spouse name: N/A  . Number of children: 2  . Years of education: college    Occupational History  .  Retired   Social History Main Topics  . Smoking status: Former Smoker    Quit date: 06/29/1990  . Smokeless tobacco: Never Used  . Alcohol use No  . Drug use: No  . Sexual activity: Not Asked   Other Topics Concern  . None   Social History Narrative  . None   Past Surgical History:    Procedure Laterality Date  . [place]  . CHOLECYSTECTOMY  1997  . CORONARY ANGIOPLASTY WITH STENT PLACEMENT  2006   to LAD, ATRETIC LIMA AT THAT TIME ALSO  . CORONARY ANGIOPLASTY WITH STENT PLACEMENT  2007   STENT TO PROX VG-DIAG, OTHER STENTS PATENT  . CORONARY ARTERY BYPASS GRAFT  1998   LIMA-LAD;VG-DIAG & RCA  . CORONARY STENT PLACEMENT  2005   TO LAD & LCX-STENTS,  . HERNIA REPAIR    . TUBAL LIGATION     BTL   Past Medical History:  Diagnosis Date  . Abdominal aortic aneurysm ([place])   . Anemia, improved 01/11/2013  . Basal cell carcinoma   . CAD (coronary artery disease)    hx CABG 1998, last cath 2007 with PCI and stenting to the proximal segment of the vein graft to the diagonal branch. DES Taxus was placed  . Cervicalgia   . Chest wall pain   . Chronic fatigue fibromyalgia syndrome   . Chronic pain syndrome   . Coronary artery disease   . Depression   . Fibromyalgia   .  Fibromyalgia   . History of nuclear stress test 08/07/2011   lexiscan; no evidence of ischemia or infarct; low risk   . Hyperlipidemia   . Hypertension   . Hypertension   . Obstructive sleep apnea   . OSA (obstructive sleep apnea)   . PAD (peripheral artery disease) ([place])   . Pes anserine bursitis   . Pulmonary embolism ([place]) 01/11/2013  . S/P resection of aortic aneurysm   . Trochanteric bursitis   . Ulcerative colitis   . Ulcerative colitis ([place])    BP (!) 143/83 (BP Location: Right Arm, Patient Position: Sitting, Cuff Size: Normal)   Pulse 98   Resp 14   SpO2 95%   Opioid Risk Score:   Fall Risk Score:  `1  Depression screen PHQ 2/9  Depression screen [facility] 2/9 08/21/2015 11/07/2014 05/25/2013  Decreased Interest 0 0 0  Down, Depressed, Hopeless 0 0 0  PHQ - 2 Score 0 0 0  Tired, decreased energy - 1 -  Change in appetite - 1 -  Feeling bad or failure about yourself  - 0 -  Trouble concentrating - 0 -  Moving slowly or fidgety/restless - 0 -  Suicidal thoughts  - 0 -    Review of Systems  Constitutional: Negative.   HENT: Negative.   Eyes: Negative.   Respiratory: Negative.   Cardiovascular: Negative.   Gastrointestinal: Negative.   Endocrine: Negative.   Genitourinary: Negative.   Musculoskeletal: Positive for arthralgias and back pain.  Skin: Negative.   Allergic/Immunologic: Negative.   Neurological: Negative.   Hematological: Negative.   Psychiatric/Behavioral: Negative.   All other systems reviewed and are negative.      Objective:   Physical Exam  Constitutional: She is oriented to person, place, and time. She appears well-developed and well-nourished. She is mildly obese  HENT:  Head: Normocephalic and atraumatic.  Eyes: Conjunctivae are normal. Pupils are equal, round, and reactive to light.  Neck: Normal range of motion. Neck supple.  Cardiovascular: RRR  Pulmonary/Chest: normal effort.  Abdominal: Soft. Bowel sounds are normal. She exhibits no distension.  Musculoskeletal: She exhibits generalized tenderness ---has pain along medial border of scapula but no palpable trigger point -mild to moderate pain with lumbar flexion and ext     Neurological: She is alert and oriented to person, place, and time.  Skin: Skin is warm and dry.  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 [place] today.     2. Fibromyalgia:  -fairly well maintained  -maintain aquatic program and general exercise.   -I suspect area along medial right scapula is a tender point. Continue with scapular ROM efforts 3. Bilateral intermittent hand numbness: resolved.  4. LBP related to lumbar spondylosis and perhaps SIJ and  troch bursitis  -refilled Norco 5/325mg  # 90 pills--use one pill tid for pain. Second rx provided  -ok to use ibuprofen between naproxen doses for now as long as there isn't an increase. -if symptoms worsen, we can look at addressing the L4-5 and potentially the L2-3 area noted on CT. 5. Follow up with NP in about 2 months. Ten minutes of face to face patient care time were spent during this visit. All questions were encouraged  and answered.

## 2017-04-21 NOTE — Patient Instructions (Signed)
[place] EXERCISE PROGRAM AS YOU'RE DOING

## 2017-04-27 LAB — DRUG TOX MONITOR 1 W/CONF, ORAL FLD
AMPHETAMINES: NEGATIVE ng/mL (ref <10)
BARBITURATES: NEGATIVE ng/mL (ref <10)
BUPRENORPHINE: NEGATIVE ng/mL (ref <0.025)
Benzodiazepines: NEGATIVE ng/mL (ref <0.50)
COCAINE: NEGATIVE ng/mL (ref <2.5)
Codeine: NEGATIVE ng/mL (ref <2.5)
DIHYDROCODEINE: 7.9 ng/mL — AB (ref <2.5)
Fentanyl: NEGATIVE ng/mL (ref <0.10)
HYDROCODONE: 51.4 ng/mL — AB (ref <2.5)
HYDROMORPHONE: NEGATIVE ng/mL (ref <2.5)
Heroin Metabolite: NEGATIVE ng/mL (ref <1.0)
MARIJUANA: NEGATIVE ng/mL (ref <2.5)
MDMA: NEGATIVE ng/mL (ref <10)
MEPERIDINE: NEGATIVE ng/mL (ref <5.0)
Meprobamate: NEGATIVE ng/mL (ref <2.5)
Methadone: NEGATIVE ng/mL (ref <5.0)
Morphine: NEGATIVE ng/mL (ref <2.5)
NORHYDROCODONE: NEGATIVE ng/mL (ref <2.5)
Nicotine Metabolite: NEGATIVE ng/mL (ref <5.0)
Noroxycodone: NEGATIVE ng/mL (ref <2.5)
Opiates: POSITIVE ng/mL — AB (ref <2.5)
Oxycodone: NEGATIVE ng/mL (ref <2.5)
Oxymorphone: NEGATIVE ng/mL (ref <2.5)
Phencyclidine: NEGATIVE ng/mL (ref <10)
Propoxyphene: NEGATIVE ng/mL (ref <5.0)
TAPENTADOL: NEGATIVE ng/mL (ref <5.0)
Tramadol: NEGATIVE ng/mL (ref <5.0)
ZOLPIDEM: NEGATIVE ng/mL (ref <5.0)

## 2017-04-27 LAB — DRUG TOX ALC METAB W/CON, ORAL FLD: ALCOHOL METABOLITE: NEGATIVE ng/mL (ref <25)

## 2017-05-04 ENCOUNTER — Telehealth: Payer: Self-pay | Admitting: *Deleted

## 2017-05-04 NOTE — Telephone Encounter (Signed)
Oral swab drug screen was consistent for prescribed medications.  ?

## 2017-05-06 ENCOUNTER — Other Ambulatory Visit: Payer: Self-pay | Admitting: Internal Medicine

## 2017-06-23 ENCOUNTER — Encounter: Payer: Self-pay | Admitting: Registered Nurse

## 2017-06-23 ENCOUNTER — Encounter: Payer: Medicare Other | Attending: Physical Medicine and Rehabilitation | Admitting: Registered Nurse

## 2017-06-23 ENCOUNTER — Other Ambulatory Visit: Payer: Self-pay

## 2017-06-23 VITALS — BP 132/80 | HR 91 | Resp 14

## 2017-06-23 DIAGNOSIS — M545 Low back pain: Secondary | ICD-10-CM | POA: Insufficient documentation

## 2017-06-23 DIAGNOSIS — G894 Chronic pain syndrome: Secondary | ICD-10-CM | POA: Diagnosis not present

## 2017-06-23 DIAGNOSIS — M797 Fibromyalgia: Secondary | ICD-10-CM | POA: Diagnosis not present

## 2017-06-23 DIAGNOSIS — M7061 Trochanteric bursitis, right hip: Secondary | ICD-10-CM | POA: Diagnosis not present

## 2017-06-23 DIAGNOSIS — M4302 Spondylolysis, cervical region: Secondary | ICD-10-CM | POA: Insufficient documentation

## 2017-06-23 DIAGNOSIS — Z79899 Other long term (current) drug therapy: Secondary | ICD-10-CM | POA: Diagnosis not present

## 2017-06-23 DIAGNOSIS — Z5181 Encounter for therapeutic drug level monitoring: Secondary | ICD-10-CM | POA: Diagnosis not present

## 2017-06-23 DIAGNOSIS — R5382 Chronic fatigue, unspecified: Secondary | ICD-10-CM | POA: Insufficient documentation

## 2017-06-23 DIAGNOSIS — M7062 Trochanteric bursitis, left hip: Secondary | ICD-10-CM | POA: Diagnosis not present

## 2017-06-23 DIAGNOSIS — G8929 Other chronic pain: Secondary | ICD-10-CM | POA: Diagnosis not present

## 2017-06-23 DIAGNOSIS — I2510 Atherosclerotic heart disease of native coronary artery without angina pectoris: Secondary | ICD-10-CM

## 2017-06-23 MED ORDER — HYDROCODONE-ACETAMINOPHEN 5-325 MG PO TABS: ORAL_TABLET | ORAL | 0 refills | Status: DC

## 2017-06-23 NOTE — Progress Notes (Signed)
Subjective:    Patient ID: Kara Bush, female    DOB: 16-Apr-1941, 76 y.o.   MRN: [id number]  HPI: [name] is a 76year old female who returns for follow up appointment for chronic pain and medication refill. She states her pain is located in her upper back mainly right side and bilateral hips. She  rates her pain 3.  Her current exercise regime is water exercises at the [facility] three times  a week and walking short distances.  Kara Bush reports she has a cough and rhinorrhea, tem 99.00, denies chills. She will f/u with her PCP if symptoms persists she states.   Kara Bush Morphine equivalent is 15.00 MME.   Last UDS was performed on 04/21/17, it was consistent.  Pain Inventory Average Pain 3 Pain Right Now 3 My pain is aching  In the last 24 hours, has pain interfered with the following? General activity 2 Relation with others 0 Enjoyment of life 2 What TIME of day is your pain at its worst? daytime Sleep (in general) Good  Pain is worse with: walking, bending and some activites Pain improves with: medication Relief from Meds: 6  Mobility walk without assistance Do you have any goals in this area?  no  Function Do you have any goals in this area?  no  Neuro/Psych No problems in this area  Prior Studies Any changes since last visit?  no  Physicians involved in your care Any changes since last visit?  no   Family History  Problem Relation Age of Onset  . Heart disease Father   . Heart attack Father        @ 15  . Cancer Paternal Aunt        BREAST  . Heart failure Mother        at 52  . Healthy Sister   . Healthy Brother   . Heart failure Maternal Grandmother   . Heart attack Maternal Grandfather   . Heart attack Paternal Grandmother        at 3   Social History   Socioeconomic History  . Marital status: Divorced    Spouse name: None  . Number of children: 2  . Years of education: college   . Highest education level: None    Social Needs  . Financial resource strain: None  . Food insecurity - worry: None  . Food insecurity - inability: None  . Transportation needs - medical: None  . Transportation needs - non-medical: None  Occupational History    Employer: RETIRED  Tobacco Use  . Smoking status: Former Smoker    Last attempt to quit: 06/29/1990    Years since quitting: 27.0  . Smokeless tobacco: Never Used  Substance and Sexual Activity  . Alcohol use: No  . Drug use: No  . Sexual activity: None  Other Topics Concern  . None  Social History Narrative  . None   Past Surgical History:  Procedure Laterality Date  . [place]  . CHOLECYSTECTOMY  1997  . CORONARY ANGIOPLASTY WITH STENT PLACEMENT  2006   to LAD, ATRETIC LIMA AT THAT TIME ALSO  . CORONARY ANGIOPLASTY WITH STENT PLACEMENT  2007   STENT TO PROX VG-DIAG, OTHER STENTS PATENT  . CORONARY ARTERY BYPASS GRAFT  1998   LIMA-LAD;VG-DIAG & RCA  . CORONARY STENT PLACEMENT  2005   TO LAD & LCX-STENTS,  . HERNIA REPAIR    . TUBAL LIGATION  BTL   Past Medical History:  Diagnosis Date  . Abdominal aortic aneurysm ([place])   . Anemia, improved 01/11/2013  . Basal cell carcinoma   . CAD (coronary artery disease)    hx CABG 1998, last cath 2007 with PCI and stenting to the proximal segment of the vein graft to the diagonal branch. DES Taxus was placed  . Cervicalgia   . Chest wall pain   . Chronic fatigue fibromyalgia syndrome   . Chronic pain syndrome   . Coronary artery disease   . Depression   . Fibromyalgia   . Fibromyalgia   . History of nuclear stress test 08/07/2011   lexiscan; no evidence of ischemia or infarct; low risk   . Hyperlipidemia   . Hypertension   . Hypertension   . Obstructive sleep apnea   . OSA (obstructive sleep apnea)   . PAD (peripheral artery disease) ([place])   . Pes anserine bursitis   . Pulmonary embolism ([place]) 01/11/2013  . S/P resection of aortic aneurysm   . Trochanteric bursitis    . Ulcerative colitis   . Ulcerative colitis ([place])    BP (!) 165/92 (BP Location: Right Arm, Patient Position: Sitting, Cuff Size: Normal)   Pulse 96   Resp 14   SpO2 94%   Opioid Risk Score:   Fall Risk Score:  `1  Depression screen PHQ 2/9  Depression screen [facility] 2/9 08/21/2015 11/07/2014 05/25/2013  Decreased Interest 0 0 0  Down, Depressed, Hopeless 0 0 0  PHQ - 2 Score 0 0 0  Tired, decreased energy - 1 -  Change in appetite - 1 -  Feeling bad or failure about yourself  - 0 -  Trouble concentrating - 0 -  Moving slowly or fidgety/restless - 0 -  Suicidal thoughts - 0 -     Review of Systems  Constitutional: Negative.   HENT: Negative.   Eyes: Negative.   Respiratory: Negative.   Cardiovascular: Negative.   Gastrointestinal: Negative.   Endocrine: Negative.   Genitourinary: Negative.   Musculoskeletal: Positive for arthralgias and back pain.  Skin: Negative.   Allergic/Immunologic: Negative.   Neurological: Negative.   Hematological: Negative.   Psychiatric/Behavioral: Negative.   All other systems reviewed and are negative.      Objective:   Physical Exam  Constitutional: She is oriented to person, place, and time. She appears well-developed and well-nourished.  HENT:  Head: Normocephalic and atraumatic.  Neck: Normal range of motion. Neck supple.  Cardiovascular: Normal rate and regular rhythm.  Pulmonary/Chest: Effort normal and breath sounds normal.  Musculoskeletal:  Normal Muscle Bulk and Muscle Testing Reveals: Upper Extremities: Full ROM and Muscle Strength 5/5 Thoracic Paraspinal Tenderness: T-7-T-9 Mainly Right Side Bilateral Greater Trochanter Tenderness Lower Extremities: Full ROM and Muscle Strength 5/5 Arises from Table with ease Narrow Based Gait   Neurological: She is alert and oriented to person, place, and time.  Skin: Skin is warm and dry.  Psychiatric: She has a normal mood and affect.  Nursing note and vitals reviewed.          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 06/23/2017 2. Fibromyalgia: Continue Pool therapyand Home Exercise Program. 06/23/2017 3. Bilateral intermittent hand numbness: Carpal tunnel syndrome versus C6 radiculopathy mild: Continue to Monitor. 06/23/2017 4. LBP: Refilled: Norco 5/325mg  # 90 pills--use one pill tid for pain. A second prescription was given for the following month.  06/23/2017 We will continue the opioid monitoring program, this consists of regular clinic visits, examinations, urine drug screen, pill counts as well as use of [place] Controlled Substance Reporting System.  5. Muscle Spasm: ContinueRobaxin. 06/23/2017  20 minutes of face to face patient care time was spent during this visit. All questions were encouraged and answered.  F/U in 2 months

## 2017-07-24 ENCOUNTER — Other Ambulatory Visit (HOSPITAL_COMMUNITY): Payer: Self-pay | Admitting: Internal Medicine

## 2017-08-03 ENCOUNTER — Other Ambulatory Visit: Payer: Self-pay | Admitting: *Deleted

## 2017-08-03 MED ORDER — LISINOPRIL 5 MG PO TABS: 5.0000 mg | ORAL_TABLET | Freq: Every day | ORAL | 2 refills | Status: DC

## 2017-08-10 ENCOUNTER — Ambulatory Visit (INDEPENDENT_AMBULATORY_CARE_PROVIDER_SITE_OTHER): Payer: Medicare Other | Admitting: Internal Medicine

## 2017-08-10 ENCOUNTER — Encounter: Payer: Self-pay | Admitting: Internal Medicine

## 2017-08-10 VITALS — BP 143/68 | HR 80 | Ht 64.0 in | Wt 182.4 lb

## 2017-08-10 DIAGNOSIS — I712 Thoracic aortic aneurysm, without rupture: Secondary | ICD-10-CM

## 2017-08-10 DIAGNOSIS — Z951 Presence of aortocoronary bypass graft: Secondary | ICD-10-CM | POA: Diagnosis not present

## 2017-08-10 DIAGNOSIS — R0609 Other forms of dyspnea: Secondary | ICD-10-CM

## 2017-08-10 DIAGNOSIS — I739 Peripheral vascular disease, unspecified: Secondary | ICD-10-CM

## 2017-08-10 DIAGNOSIS — I7121 Aneurysm of the ascending aorta, without rupture: Secondary | ICD-10-CM

## 2017-08-10 NOTE — Progress Notes (Signed)
08/10/2017   PCP: Leighton Ruff, MD   Chief Complaint  Patient presents with  . Follow-up    HPI: 77 year old white female with a history of coronary artery disease as well as peripheral vascular disease. She had bypass grafting in 1998 and stents in 2005 2006 and 2000 710 negative nuclear stress test in 2013 she also has peripheral arterial disease and history of aortic iliac bypass in 1991. Last ABIs were made of 2013 were normal.  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  Fortunately she was asymptomatic, she was placed on anticoagulation with the Xarelto.   She has done well since that time.  Her last visit with Dr. Debara Pickett was in February and he was planning to recheck d-dimer and if it was still elevated she would need to continue her Xarelto for total of 6 months.  Her d-dimer continues to be elevated at 0.67. Initial d-dimer in February was 1.48.  Venous Dopplers were also done in February which were negative for DVT.    Mrs. Aloia just had a repeat D. dimer which was elevated at 1.48, actually slightly higher than it had been before. She tells a story that over the past several weeks she's had a urinary tract infection and started taking Bactrim for which she is more nauseated and is actually thrown up. She reports some tenderness across her epigastric area as well as the costovertebral angles. She's also had fever, chills and some sweating at night. She is planning on seeing her primary care doctor back in the office this afternoon. She denies any worsening shortness of breath or chest pain. He's had no bleeding complications on Xarelto.  Daphene returns today for follow-up. She recently reports some shortness of breath with exertion. She occasionally gets twinges in her chest however it is not reminiscent of her prior angina. Nevertheless her last stress test was 2  years ago. I think a lot of her shortness of breath is due to weight gain and minimal exercise. She does water exercise but not water aerobics.  I saw Brownie Donofrio today in follow-up of her stress test and lower extremity Dopplers. Unfortunately she did not get the lower extremity Dopplers. She did undergo a metabolic stress testing. This showed no circulatory limitation to exercise. The main factors in her shortness of breath seems to be obesity and some pulmonary restriction. Currently she denies any significant leg claudication.  07/03/2106  Mrs. Will returns today for follow-up. Overall she's feeling fairly well. She had lower chimney arterial Dopplers a year ago which showed normal ABIs. She gets some occasional pain, burning and itching in her extremities which may be related to fibromyalgia. She denies any worsening chest pain or shortness of breath with exertion.  08/10/2017  Mrs. Donahey was seen today in follow-up.  She says she is fairly stable with regards to her symptoms compared to last year.  She gets short of breath, when doing moderate to heavy exertion.  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  She had slight worsening of her right lower extremity ABI last year but denies any worsening claudication with exertion.  Blood pressures been well controlled at home.  She has a physical with routine blood work in 3 months.   Allergies  Allergen Reactions  . Atenolol  Other (See Comments)    cholitis  . Bactrim [Sulfamethoxazole-Trimethoprim] Nausea And Vomiting  . Cymbalta [Duloxetine Hcl] Other (See Comments)    Mad pt feel spacey  . Lyrica [Pregabalin] Other (See Comments)    Made pt feel spacey  . Penicillins Rash    Current Outpatient Medications  Medication Sig Dispense Refill  . aspirin 81 MG tablet Take 81 mg by mouth daily.     Marland Kitchen b complex vitamins capsule Take 1 capsule by mouth daily.    . citalopram  (CELEXA) 20 MG tablet     . ezetimibe (ZETIA) 10 MG tablet TAKE 1 TABLET BY MOUTH EVERY DAY 90 tablet 1  . HYDROcodone-acetaminophen (NORCO/VICODIN) 5-325 MG tablet TAKE 1 TABLET BY MOUTH THREE TIMES A DAY 90 tablet 0  . lisinopril (PRINIVIL,ZESTRIL) 5 MG tablet Take 1 tablet (5 mg total) by mouth daily. 90 tablet 2  . methocarbamol (ROBAXIN) 500 MG tablet Take 1 tablet (500 mg total) by mouth every 8 (eight) hours as needed for muscle spasms. 30 tablet 4  . metoprolol succinate (TOPROL-XL) 25 MG 24 hr tablet TAKE 1/2 TABLET EVERY DAY 45 tablet 3  . Multiple Vitamin (MULITIVITAMIN WITH MINERALS) TABS Take 1 tablet by mouth daily.      . naproxen sodium (ANAPROX) 220 MG tablet Take 220 mg by mouth 2 (two) times daily with a meal. For pain.    . simvastatin (ZOCOR) 40 MG tablet TAKE 1 TABLET BY MOUTH EVERY DAY AT 6PM 90 tablet 0  . zolpidem (AMBIEN) 10 MG tablet Take 5 mg by mouth at bedtime. For sleep.     No current facility-administered medications for this visit.     Past Medical History:  Diagnosis Date  . Abdominal aortic aneurysm ([place])   . Anemia, improved 01/11/2013  . Basal cell carcinoma   . CAD (coronary artery disease)    hx CABG 1998, last cath 2007 with PCI and stenting to the proximal segment of the vein graft to the diagonal branch. DES Taxus was placed  . Cervicalgia   . Chest wall pain   . Chronic fatigue fibromyalgia syndrome   . Chronic pain syndrome   . Coronary artery disease   . Depression   . Fibromyalgia   . Fibromyalgia   . History of nuclear stress test 08/07/2011   lexiscan; no evidence of ischemia or infarct; low risk   . Hyperlipidemia   . Hypertension   . Hypertension   . Obstructive sleep apnea   . OSA (obstructive sleep apnea)   . PAD (peripheral artery disease) ([place])   . Pes anserine bursitis   . Pulmonary embolism ([place]) 01/11/2013  . S/P resection of aortic aneurysm   . Trochanteric bursitis   . Ulcerative colitis   . Ulcerative colitis [facility])      Past Surgical History:  Procedure Laterality Date  . [place]  . CHOLECYSTECTOMY  1997  . CORONARY ANGIOPLASTY WITH STENT PLACEMENT  2006   to LAD, ATRETIC LIMA AT THAT TIME ALSO  . CORONARY ANGIOPLASTY WITH STENT PLACEMENT  2007   STENT TO PROX VG-DIAG, OTHER STENTS PATENT  . CORONARY ARTERY BYPASS GRAFT  1998   LIMA-LAD;VG-DIAG & RCA  . CORONARY STENT PLACEMENT  2005   TO LAD & LCX-STENTS,  . HERNIA REPAIR    . TUBAL LIGATION     BTL   EKG: Sinus rhythm at 80-personally reviewed  ROS: Pertinent items noted in HPI and remainder of comprehensive ROS  otherwise negative.  PHYSICAL EXAM BP (!) 143/68   Pulse 80   Ht 5\' 4"  (1.626 m)   Wt 182 lb 6.4 oz (82.7 kg)   BMI 31.31 kg/m  General appearance: alert, no distress and moderately obese Neck: no carotid bruit and no JVD Lungs: clear to auscultation bilaterally Heart: regular rate and rhythm and systolic murmur: early systolic 2/6, blowing at 2nd right intercostal space Abdomen: soft, non-tender; bowel sounds normal; no masses,  no organomegaly Extremities: extremities normal, atraumatic, no cyanosis or edema Pulses: 2+ and symmetric Skin: Skin color, texture, turgor normal. No rashes or lesions Neurologic: Grossly normal Psych: Pleasant  ASSESSMENT AND PLAN Patient Active Problem List   Diagnosis Date Noted  . 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 Trochanteric bursitis 11/07/2014  . Sacro-iliac pain 11/07/2014  . Low back pain 11/07/2013  . Hepatitis 04/20/2013  . Screening for STD (sexually transmitted disease) 04/20/2013  . Recurrent UTI 04/20/2013  . Abdominal aortic aneurysm ([place])   . Ulcerative colitis ([place])   . Obstructive sleep apnea   . Fibromyalgia   . Coronary artery disease   . Basal cell carcinoma   . Anemia, improved 01/11/2013  . Ascending aortic aneurysm, 4.1 cm 01/11/2013  . 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 Chronic periscapular pain 10/15/2011   . Chest wall pain   . S/P resection of aortic aneurysm, 1998 prior to CABG   . PAD (peripheral artery disease), aortic-iliac bypass 1991, mild carotid bruits   . OSA (obstructive sleep apnea)   . Chronic fatigue fibromyalgia syndrome   . Hyperlipidemia   . Ulcerative colitis   . Depression   . CAD (coronary artery disease), CABG 1998, STENTS MULTIPLE SITES SINCE.    Marland Kitchen Hypertension    PLAN: Mrs. Matuszak seems to be stable compared to last year.  She will need a repeat imaging procedure of her ascending aorta.  I recommend an echo since she has had some shortness of breath with exertion.  She did have a mild decrease in her right ABI of last arterial Dopplers.  She is due for repeat annual Dopplers which will be scheduled in the near future.  Finally she needs a refill of her lisinopril.  Blood pressure is well controlled at home.  Follow-up with me annually or sooner as necessary.  Pixie Casino, MD, [facility], [place] Director of the Advanced Lipid Disorders &  Cardiovascular Risk Reduction Clinic Diplomate of the American Board of Clinical Lipidology Attending Cardiologist  Direct Dial: [phone]  Fax: [phone]  Website:  [email or web address]

## 2017-08-10 NOTE — Patient Instructions (Signed)
Dr. Debara Pickett has recommended the following tests: -- echocardiogram ([address] [place]) -- lower extremity arterial doppler -- abdominal aorta ultrasound  Your physician wants you to follow-up in: ONE YEAR with Dr. Debara Pickett. You will receive a reminder letter in the mail two months in advance. If you don't receive a letter, please call our office to schedule the follow-up appointment.  Lisinopril was refilled on 08/03/17 for 90 day supply to [facility]

## 2017-08-17 ENCOUNTER — Other Ambulatory Visit: Payer: Self-pay

## 2017-08-17 MED ORDER — METOPROLOL SUCCINATE ER 25 MG PO TB24: 12.5000 mg | ORAL_TABLET | Freq: Every day | ORAL | 3 refills | Status: DC

## 2017-08-17 MED ORDER — EZETIMIBE 10 MG PO TABS: 10.0000 mg | ORAL_TABLET | Freq: Every day | ORAL | 3 refills | Status: DC

## 2017-08-17 NOTE — Addendum Note (Signed)
Addended by: Diana Eves on: 08/17/2017 03:33 PM   Modules accepted: Orders

## 2017-08-17 NOTE — Telephone Encounter (Signed)
Rx(s) sent to pharmacy electronically.  

## 2017-08-18 ENCOUNTER — Encounter: Payer: Medicare Other | Attending: Physical Medicine and Rehabilitation | Admitting: Registered Nurse

## 2017-08-18 ENCOUNTER — Encounter: Payer: Self-pay | Admitting: Registered Nurse

## 2017-08-18 ENCOUNTER — Other Ambulatory Visit: Payer: Self-pay

## 2017-08-18 VITALS — BP 136/81 | HR 82

## 2017-08-18 DIAGNOSIS — M545 Low back pain: Secondary | ICD-10-CM | POA: Insufficient documentation

## 2017-08-18 DIAGNOSIS — M4302 Spondylolysis, cervical region: Secondary | ICD-10-CM | POA: Diagnosis not present

## 2017-08-18 DIAGNOSIS — R5382 Chronic fatigue, unspecified: Secondary | ICD-10-CM | POA: Insufficient documentation

## 2017-08-18 DIAGNOSIS — G8929 Other chronic pain: Secondary | ICD-10-CM

## 2017-08-18 DIAGNOSIS — Z5181 Encounter for therapeutic drug level monitoring: Secondary | ICD-10-CM | POA: Diagnosis not present

## 2017-08-18 DIAGNOSIS — M7061 Trochanteric bursitis, right hip: Secondary | ICD-10-CM

## 2017-08-18 DIAGNOSIS — M797 Fibromyalgia: Secondary | ICD-10-CM | POA: Diagnosis not present

## 2017-08-18 DIAGNOSIS — G894 Chronic pain syndrome: Secondary | ICD-10-CM

## 2017-08-18 DIAGNOSIS — Z79899 Other long term (current) drug therapy: Secondary | ICD-10-CM

## 2017-08-18 MED ORDER — HYDROCODONE-ACETAMINOPHEN 5-325 MG PO TABS: ORAL_TABLET | ORAL | 0 refills | Status: DC

## 2017-08-18 NOTE — Progress Notes (Signed)
Subjective:    Patient ID: Kara Bush, female    DOB: [birth year], 77 y.o.   MRN: [id number]  HPI: [name] is a 77year old female who returns for follow up appointment for chronic pain and medication refill. She states her pain is located in her mid- lower back and right hip pain. She rates her pain 4. Her current exercise regime is attending the [facility] water exercises 2-3 times a week and walking.   Kara Bush Morphine equivalent is 16.00 MME.   Last UDS was performed on 04/21/17, it was consistent.  Pain Inventory Average Pain 4 Pain Right Now 4 My pain is aching  In the last 24 hours, has pain interfered with the following? General activity 2 Relation with others 0 Enjoyment of life 2 What TIME of day is your pain at its worst? daytime Sleep (in general) Good  Pain is worse with: walking, bending and some activites Pain improves with: medication Relief from Meds: 6  Mobility walk without assistance ability to climb steps?  yes do you drive?  yes Do you have any goals in this area?  no  Function I need assistance with the following:  . Do you have any goals in this area?  no  Neuro/Psych No problems in this area  Prior Studies Any changes since last visit?  no  Physicians involved in your care Any changes since last visit?  no   Family History  Problem Relation Age of Onset  . Heart disease Father   . Heart attack Father        @ 52  . Cancer Paternal Aunt        BREAST  . Heart failure Mother        at 46  . Healthy Sister   . Healthy Brother   . Heart failure Maternal Grandmother   . Heart attack Maternal Grandfather   . Heart attack Paternal Grandmother        at 49   Social History   Socioeconomic History  . Marital status: Divorced    Spouse name: None  . Number of children: 2  . Years of education: college   . Highest education level: None  Social Needs  . Financial resource strain: None  . Food insecurity -  worry: None  . Food insecurity - inability: None  . Transportation needs - medical: None  . Transportation needs - non-medical: None  Occupational History    Employer: RETIRED  Tobacco Use  . Smoking status: Former Smoker    Last attempt to quit: 06/29/1990    Years since quitting: 27.1  . Smokeless tobacco: Never Used  Substance and Sexual Activity  . Alcohol use: No  . Drug use: No  . Sexual activity: None  Other Topics Concern  . None  Social History Narrative  . None   Past Surgical History:  Procedure Laterality Date  . [place]  . CHOLECYSTECTOMY  1997  . CORONARY ANGIOPLASTY WITH STENT PLACEMENT  2006   to LAD, ATRETIC LIMA AT THAT TIME ALSO  . CORONARY ANGIOPLASTY WITH STENT PLACEMENT  2007   STENT TO PROX VG-DIAG, OTHER STENTS PATENT  . CORONARY ARTERY BYPASS GRAFT  1998   LIMA-LAD;VG-DIAG & RCA  . CORONARY STENT PLACEMENT  2005   TO LAD & LCX-STENTS,  . HERNIA REPAIR    . TUBAL LIGATION     BTL   Past Medical History:  Diagnosis Date  .  Abdominal aortic aneurysm ([place])   . Anemia, improved 01/11/2013  . Basal cell carcinoma   . CAD (coronary artery disease)    hx CABG 1998, last cath 2007 with PCI and stenting to the proximal segment of the vein graft to the diagonal branch. DES Taxus was placed  . Cervicalgia   . Chest wall pain   . Chronic fatigue fibromyalgia syndrome   . Chronic pain syndrome   . Coronary artery disease   . Depression   . Fibromyalgia   . Fibromyalgia   . History of nuclear stress test 08/07/2011   lexiscan; no evidence of ischemia or infarct; low risk   . Hyperlipidemia   . Hypertension   . Hypertension   . Obstructive sleep apnea   . OSA (obstructive sleep apnea)   . PAD (peripheral artery disease) ([place])   . Pes anserine bursitis   . Pulmonary embolism ([place]) 01/11/2013  . S/P resection of aortic aneurysm   . Trochanteric bursitis   . Ulcerative colitis   . Ulcerative colitis ([place])    There were no  vitals taken for this visit.  Opioid Risk Score:  1 Fall Risk Score:  `1  Depression screen PHQ 2/9  Depression screen [facility] 2/9 08/18/2017 08/21/2015 11/07/2014 05/25/2013  Decreased Interest 0 0 0 0  Down, Depressed, Hopeless 0 0 0 0  PHQ - 2 Score 0 0 0 0  Tired, decreased energy - - 1 -  Change in appetite - - 1 -  Feeling bad or failure about yourself  - - 0 -  Trouble concentrating - - 0 -  Moving slowly or fidgety/restless - - 0 -  Suicidal thoughts - - 0 -     Review of Systems  Constitutional: Negative.   HENT: Negative.   Eyes: Negative.   Respiratory: Negative.   Cardiovascular: Negative.   Gastrointestinal: Negative.   Endocrine: Negative.   Genitourinary: Negative.   Musculoskeletal: Positive for arthralgias and back pain.  Skin: Negative.   Allergic/Immunologic: Negative.   Neurological: Negative.   Hematological: Negative.   Psychiatric/Behavioral: Negative.   All other systems reviewed and are negative.      Objective:   Physical Exam  Constitutional: She is oriented to person, place, and time. She appears well-developed and well-nourished.  HENT:  Head: Normocephalic and atraumatic.  Neck: Normal range of motion. Neck supple.  Cardiovascular: Normal rate and regular rhythm.  Pulmonary/Chest: Effort normal and breath sounds normal.  Musculoskeletal:  Normal Muscle Bulk and Muscle Testing Reveals: Upper Extremities: Full ROM and Muscle Strength 5/5 Thoracic Paraspinal Tenderness: T-7-T-9 Lumbar Paraspinal Tenderness: L-3-L-5 Lower Extremities: Full ROM and Muscle Strength 5/5 Arises from Table with ease Narrow Based Gait   Neurological: She is alert and oriented to person, place, and time.  Skin: Skin is warm and dry.  Psychiatric: She has a normal mood and affect.  Nursing note and vitals reviewed.         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 08/18/2017 2. Fibromyalgia: Continue Pool therapyand Home Exercise Program. 08/18/2017 3. Bilateral intermittent hand numbness: Carpal tunnel syndrome versus C6 radiculopathy mild: No complaints today. Continue to Monitor. 08/18/2017 4. Chronic Midline Low Back Pain/ LBP: Refilled: Norco 5/325mg  # 90 pills--use one pill tid for pain. A second prescription was given for the following month. 08/18/2017 We will continue the opioid monitoring program, this consists of regular clinic visits,  examinations, urine drug screen, pill counts as well as use of [place] Controlled Substance Reporting System.  5. Muscle Spasm: ContinueRobaxin. 08/18/2017 6. Right Greater Trochanteric Bursitis: Continue to Alternate with heat and ice therapy. Continue current medication regime. 08/18/2017.  20 minutes of face to face patient care time was spent during this visit. All questions were encouraged and answered.  F/U in 2 months

## 2017-08-20 ENCOUNTER — Other Ambulatory Visit: Payer: Self-pay

## 2017-08-20 ENCOUNTER — Ambulatory Visit (HOSPITAL_COMMUNITY): Payer: Medicare Other | Attending: Internal Medicine

## 2017-08-20 DIAGNOSIS — I7121 Aneurysm of the ascending aorta, without rupture: Secondary | ICD-10-CM

## 2017-08-20 DIAGNOSIS — I5030 Unspecified diastolic (congestive) heart failure: Secondary | ICD-10-CM | POA: Insufficient documentation

## 2017-08-20 DIAGNOSIS — R0609 Other forms of dyspnea: Secondary | ICD-10-CM

## 2017-08-20 DIAGNOSIS — I420 Dilated cardiomyopathy: Secondary | ICD-10-CM | POA: Diagnosis not present

## 2017-08-20 DIAGNOSIS — I080 Rheumatic disorders of both mitral and aortic valves: Secondary | ICD-10-CM | POA: Diagnosis not present

## 2017-08-20 DIAGNOSIS — I712 Thoracic aortic aneurysm, without rupture: Secondary | ICD-10-CM

## 2017-08-21 ENCOUNTER — Telehealth: Payer: Self-pay | Admitting: Internal Medicine

## 2017-08-21 NOTE — Telephone Encounter (Signed)
Patient aware of results.

## 2017-08-21 NOTE — Telephone Encounter (Signed)
Returning your call,concerning her Echo results. °

## 2017-08-24 ENCOUNTER — Other Ambulatory Visit: Payer: Self-pay | Admitting: *Deleted

## 2017-08-24 DIAGNOSIS — I712 Thoracic aortic aneurysm, without rupture: Secondary | ICD-10-CM

## 2017-08-24 DIAGNOSIS — I7121 Aneurysm of the ascending aorta, without rupture: Secondary | ICD-10-CM

## 2017-08-24 DIAGNOSIS — I716 Thoracoabdominal aortic aneurysm, without rupture, unspecified: Secondary | ICD-10-CM

## 2017-09-03 ENCOUNTER — Ambulatory Visit (HOSPITAL_COMMUNITY)
Admission: RE | Admit: 2017-09-03 | Discharge: 2017-09-03 | Disposition: A | Discharge status: Home / Self Care | Payer: Medicare Other | Source: Ambulatory Visit | Attending: Cardiovascular Disease | Admitting: Cardiovascular Disease

## 2017-09-03 DIAGNOSIS — I714 Abdominal aortic aneurysm, without rupture, unspecified: Secondary | ICD-10-CM

## 2017-09-03 DIAGNOSIS — I739 Peripheral vascular disease, unspecified: Secondary | ICD-10-CM

## 2017-09-24 ENCOUNTER — Other Ambulatory Visit: Payer: Self-pay | Admitting: *Deleted

## 2017-09-24 DIAGNOSIS — R6889 Other general symptoms and signs: Secondary | ICD-10-CM

## 2017-09-24 DIAGNOSIS — I714 Abdominal aortic aneurysm, without rupture, unspecified: Secondary | ICD-10-CM

## 2017-09-24 DIAGNOSIS — I739 Peripheral vascular disease, unspecified: Secondary | ICD-10-CM

## 2017-10-01 DIAGNOSIS — Z1231 Encounter for screening mammogram for malignant neoplasm of breast: Secondary | ICD-10-CM | POA: Diagnosis not present

## 2017-10-06 DIAGNOSIS — H16141 Punctate keratitis, right eye: Secondary | ICD-10-CM | POA: Diagnosis not present

## 2017-10-06 DIAGNOSIS — H01003 Unspecified blepharitis right eye, unspecified eyelid: Secondary | ICD-10-CM | POA: Diagnosis not present

## 2017-10-06 DIAGNOSIS — H01006 Unspecified blepharitis left eye, unspecified eyelid: Secondary | ICD-10-CM | POA: Diagnosis not present

## 2017-10-13 ENCOUNTER — Encounter: Payer: Self-pay | Admitting: Physical Medicine & Rehabilitation

## 2017-10-13 ENCOUNTER — Other Ambulatory Visit: Payer: Self-pay | Admitting: Physical Medicine & Rehabilitation

## 2017-10-13 ENCOUNTER — Encounter: Payer: Medicare Other | Attending: Physical Medicine and Rehabilitation | Admitting: Physical Medicine & Rehabilitation

## 2017-10-13 VITALS — BP 143/82 | HR 100 | Resp 14

## 2017-10-13 DIAGNOSIS — R5382 Chronic fatigue, unspecified: Secondary | ICD-10-CM | POA: Insufficient documentation

## 2017-10-13 DIAGNOSIS — Z79891 Long term (current) use of opiate analgesic: Secondary | ICD-10-CM | POA: Diagnosis not present

## 2017-10-13 DIAGNOSIS — M545 Low back pain: Secondary | ICD-10-CM | POA: Insufficient documentation

## 2017-10-13 DIAGNOSIS — G894 Chronic pain syndrome: Secondary | ICD-10-CM | POA: Diagnosis not present

## 2017-10-13 DIAGNOSIS — Z79899 Other long term (current) drug therapy: Secondary | ICD-10-CM | POA: Diagnosis not present

## 2017-10-13 DIAGNOSIS — M4302 Spondylolysis, cervical region: Secondary | ICD-10-CM | POA: Insufficient documentation

## 2017-10-13 DIAGNOSIS — Z5181 Encounter for therapeutic drug level monitoring: Secondary | ICD-10-CM | POA: Diagnosis not present

## 2017-10-13 DIAGNOSIS — M797 Fibromyalgia: Secondary | ICD-10-CM | POA: Diagnosis not present

## 2017-10-13 MED ORDER — HYDROCODONE-ACETAMINOPHEN 5-325 MG PO TABS: ORAL_TABLET | ORAL | 0 refills | Status: DC

## 2017-10-13 NOTE — Patient Instructions (Signed)
POSTURE AND STRETCHING!!!!   PLEASE FEEL FREE TO CALL OUR OFFICE WITH ANY PROBLEMS OR QUESTIONS ([phone])

## 2017-10-13 NOTE — Progress Notes (Signed)
Subjective:    Patient ID: Kara Bush, female    DOB: 02-17-1941, 77 y.o.   MRN: [id number]  HPI   Kara Bush is here in follow-up of her fibromyalgia and chronic cervical and lumbar pain.  She states that things have been going fairly well since I last saw her.  She remains active going to the gym 2 or 3 times a week to do her water therapy.  She does do some general walking and exercise around her home as well.  She has not been diligent with her stretching and maintenance program that we have reviewed in the past.  Pain is primarily in her low back and cervical spine regions at this point.  Her general fibromyalgia pain seems to be better controlled.  For pain control she is using hydrocodone 5/325 up to 3 times a day as needed.  She also using naproxen twice daily with meals and had occasional ibuprofen.  If her low back tightens up she will take the Robaxin periodically.    Pain Inventory Average Pain 4 Pain Right Now 3 My pain is constant and aching  In the last 24 hours, has pain interfered with the following? General activity 2 Relation with others 0 Enjoyment of life 0 What TIME of day is your pain at its worst? evening Sleep (in general) Good  Pain is worse with: walking and bending Pain improves with: rest and medication Relief from Meds: 5  Mobility walk without assistance how many minutes can you walk? 3-5 ability to climb steps?  no do you drive?  yes Do you have any goals in this area?  no  Function retired Do you have any goals in this area?  no  Neuro/Psych No problems in this area  Prior Studies Any changes since last visit?  no  Physicians involved in your care Any changes since last visit?  no   Family History  Problem Relation Age of Onset  . Heart disease Father   . Heart attack Father        @ 14  . Cancer Paternal Aunt        BREAST  . Heart failure Mother        at 51  . Healthy Sister   . Healthy Brother   . Heart  failure Maternal Grandmother   . Heart attack Maternal Grandfather   . Heart attack Paternal Grandmother        at 1   Social History   Socioeconomic History  . Marital status: Divorced    Spouse name: None  . Number of children: 2  . Years of education: college   . Highest education level: None  Social Needs  . Financial resource strain: None  . Food insecurity - worry: None  . Food insecurity - inability: None  . Transportation needs - medical: None  . Transportation needs - non-medical: None  Occupational History    Employer: RETIRED  Tobacco Use  . Smoking status: Former Smoker    Last attempt to quit: 06/29/1990    Years since quitting: 27.3  . Smokeless tobacco: Never Used  Substance and Sexual Activity  . Alcohol use: No  . Drug use: No  . Sexual activity: None  Other Topics Concern  . None  Social History Narrative  . None   Past Surgical History:  Procedure Laterality Date  . [place]  . CHOLECYSTECTOMY  1997  . CORONARY ANGIOPLASTY WITH STENT PLACEMENT  2006  to LAD, ATRETIC LIMA AT THAT TIME ALSO  . CORONARY ANGIOPLASTY WITH STENT PLACEMENT  2007   STENT TO PROX VG-DIAG, OTHER STENTS PATENT  . CORONARY ARTERY BYPASS GRAFT  1998   LIMA-LAD;VG-DIAG & RCA  . CORONARY STENT PLACEMENT  2005   TO LAD & LCX-STENTS,  . HERNIA REPAIR    . TUBAL LIGATION     BTL   Past Medical History:  Diagnosis Date  . Abdominal aortic aneurysm ([place])   . Anemia, improved 01/11/2013  . Basal cell carcinoma   . CAD (coronary artery disease)    hx CABG 1998, last cath 2007 with PCI and stenting to the proximal segment of the vein graft to the diagonal branch. DES Taxus was placed  . Cervicalgia   . Chest wall pain   . Chronic fatigue fibromyalgia syndrome   . Chronic pain syndrome   . Coronary artery disease   . Depression   . Fibromyalgia   . Fibromyalgia   . History of nuclear stress test 08/07/2011   lexiscan; no evidence of ischemia or  infarct; low risk   . Hyperlipidemia   . Hypertension   . Hypertension   . Obstructive sleep apnea   . OSA (obstructive sleep apnea)   . PAD (peripheral artery disease) ([place])   . Pes anserine bursitis   . Pulmonary embolism ([place]) 01/11/2013  . S/P resection of aortic aneurysm   . Trochanteric bursitis   . Ulcerative colitis   . Ulcerative colitis ([place])    BP (!) 143/82 (BP Location: Right Arm, Patient Position: Sitting, Cuff Size: Normal)   Pulse 100   Resp 14   SpO2 96%   Opioid Risk Score:   Fall Risk Score:  `1  Depression screen PHQ 2/9  Depression screen [facility] 2/9 08/18/2017 08/21/2015 11/07/2014 05/25/2013  Decreased Interest 0 0 0 0  Down, Depressed, Hopeless 0 0 0 0  PHQ - 2 Score 0 0 0 0  Tired, decreased energy - - 1 -  Change in appetite - - 1 -  Feeling bad or failure about yourself  - - 0 -  Trouble concentrating - - 0 -  Moving slowly or fidgety/restless - - 0 -  Suicidal thoughts - - 0 -    Review of Systems  Constitutional: Negative.   HENT: Negative.   Eyes: Negative.   Respiratory: Negative.   Cardiovascular: Negative.   Gastrointestinal: Negative.   Endocrine: Negative.   Genitourinary: Negative.   Musculoskeletal: Positive for back pain.  Skin: Negative.   Allergic/Immunologic: Negative.   Neurological: Negative.   Hematological: Negative.   Psychiatric/Behavioral: Positive for sleep disturbance.  All other systems reviewed and are negative.      Objective:   Physical Exam  General: No acute distress HEENT: EOMI, oral membranes moist Cards: reg rate  Chest: normal effort Abdomen: Soft, NT, ND Skin: dry, intact Extremities: no edema    Musculoskeletal: Head forward posture, shoulders curved. Hamstrings tight. LB generally TTP  Neurological: She is alert and oriented to person, place, and time.  Skin: Skin is warm and dry.  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 [place] today.   -UDS testing  2. Fibromyalgia:  -  fairly well maintained at present.  -maintain aquatic program and general exercise. Needs to stretch more. Discussed posture 3. Bilateral intermittent hand numbness: resolved.  4. LBP related to lumbar spondylosis and perhaps SIJ and troch bursitis  -refilled Norco 5/325mg  # 90 pills--use one pill tid for pain. second rx provided  -may use limited ibuprofen between naproxen doses for now as long as there isn't an increase. -consider L4-5 and potentially the L2-3 area noted on CT if back pain worsens although I thinks she can manage more conservatively. 5. Follow up with NP in about 2 months. Ten minutes of face to face patient care time were spent during this visit. All questions were encouraged and answered.

## 2017-10-19 DIAGNOSIS — I10 Essential (primary) hypertension: Secondary | ICD-10-CM | POA: Diagnosis not present

## 2017-10-19 DIAGNOSIS — H02883 Meibomian gland dysfunction of right eye, unspecified eyelid: Secondary | ICD-10-CM | POA: Diagnosis not present

## 2017-10-19 DIAGNOSIS — Z1389 Encounter for screening for other disorder: Secondary | ICD-10-CM | POA: Diagnosis not present

## 2017-10-19 DIAGNOSIS — Z0000 Encounter for general adult medical examination without abnormal findings: Secondary | ICD-10-CM | POA: Diagnosis not present

## 2017-10-19 DIAGNOSIS — F339 Major depressive disorder, recurrent, unspecified: Secondary | ICD-10-CM | POA: Diagnosis not present

## 2017-10-19 DIAGNOSIS — Z23 Encounter for immunization: Secondary | ICD-10-CM | POA: Diagnosis not present

## 2017-10-19 DIAGNOSIS — G4733 Obstructive sleep apnea (adult) (pediatric): Secondary | ICD-10-CM | POA: Diagnosis not present

## 2017-10-19 DIAGNOSIS — D509 Iron deficiency anemia, unspecified: Secondary | ICD-10-CM | POA: Diagnosis not present

## 2017-10-19 DIAGNOSIS — H01006 Unspecified blepharitis left eye, unspecified eyelid: Secondary | ICD-10-CM | POA: Diagnosis not present

## 2017-10-19 DIAGNOSIS — E7800 Pure hypercholesterolemia, unspecified: Secondary | ICD-10-CM | POA: Diagnosis not present

## 2017-10-19 DIAGNOSIS — R7301 Impaired fasting glucose: Secondary | ICD-10-CM | POA: Diagnosis not present

## 2017-10-19 DIAGNOSIS — H02889 Meibomian gland dysfunction of unspecified eye, unspecified eyelid: Secondary | ICD-10-CM | POA: Diagnosis not present

## 2017-10-19 DIAGNOSIS — H01003 Unspecified blepharitis right eye, unspecified eyelid: Secondary | ICD-10-CM | POA: Diagnosis not present

## 2017-10-19 LAB — TOXASSURE SELECT,+ANTIDEPR,UR

## 2017-10-20 ENCOUNTER — Telehealth: Payer: Self-pay | Admitting: *Deleted

## 2017-10-20 NOTE — Telephone Encounter (Signed)
Urine drug screen for this encounter is consistent for prescribed medication 

## 2017-10-25 ENCOUNTER — Other Ambulatory Visit: Payer: Self-pay | Admitting: Internal Medicine

## 2017-10-28 NOTE — Progress Notes (Signed)
Office Visit Note  Patient: Kara Bush             Date of Birth: [birth year]           MRN: [id number]             PCP: Leighton Ruff, MD Referring: Leighton Ruff, MD Visit Date: 11/10/2017 Occupation: @GUAROCC @    Subjective:  Fibromyalgia, pain in bilateral hips.   History of Present Illness: Kara Bush is a 77 y.o. female with history of fibromyalgia.  She states she continues to have pain and discomfort in her bilateral trochanteric bursa.  She has difficulty walking with long distance.  He has difficulty sleeping on the side over the trochanteric area.  She does have some trapezius pain but is tolerable currently.  The SI joint pain is tolerable currently.  She is sleeping okay with the use of Ambien.  Activities of Daily Living:  Patient reports morning stiffness for 1 hour.   Patient Reports nocturnal pain.  Difficulty dressing/grooming: Denies Difficulty climbing stairs: Reports Difficulty getting out of chair: Denies Difficulty using hands for taps, buttons, cutlery, and/or writing: Denies   Review of Systems  Constitutional: Positive for fatigue. Negative for night sweats, weight gain and weight loss.  HENT: Negative for mouth sores, trouble swallowing, trouble swallowing, mouth dryness and nose dryness.   Eyes: Positive for dryness. Negative for pain, redness and visual disturbance.  Respiratory: Negative for cough, shortness of breath and difficulty breathing.   Cardiovascular: Negative for chest pain, palpitations, hypertension, irregular heartbeat and swelling in legs/feet.  Gastrointestinal: Negative for blood in stool, constipation and diarrhea.  Endocrine: Negative for increased urination.  Genitourinary: Negative for vaginal dryness.  Musculoskeletal: Positive for arthralgias, joint pain, myalgias, morning stiffness and myalgias. Negative for joint swelling, muscle weakness and muscle tenderness.  Skin: Negative for color change, rash,  hair loss, skin tightness, ulcers and sensitivity to sunlight.  Allergic/Immunologic: Negative for susceptible to infections.  Neurological: Negative for dizziness, memory loss, night sweats and weakness.  Hematological: Negative for swollen glands.  Psychiatric/Behavioral: Positive for depressed mood and sleep disturbance. The patient is nervous/anxious.     PMFS History:  Patient Active Problem List   Diagnosis Date Noted  . DOE (dyspnea on exertion) 08/10/2017  . 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 Trochanteric bursitis 11/07/2014  . Sacro-iliac pain 11/07/2014  . Low back pain 11/07/2013  . Hepatitis 04/20/2013  . Screening for STD (sexually transmitted disease) 04/20/2013  . Recurrent UTI 04/20/2013  . Abdominal aortic aneurysm ([place])   . Ulcerative colitis ([place])   . Obstructive sleep apnea   . Fibromyalgia   . Coronary artery disease   . Basal cell carcinoma   . Anemia, improved 01/11/2013  . Ascending aortic aneurysm, 4.1 cm 01/11/2013  . 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 Chronic periscapular pain 10/15/2011  . Chest wall pain   . S/P resection of aortic aneurysm, 1998 prior to CABG   . PAD (peripheral artery disease), aortic-iliac bypass 1991, mild carotid bruits   . OSA (obstructive sleep apnea)   . Chronic fatigue fibromyalgia syndrome   . Hyperlipidemia   . Ulcerative colitis   . Depression   . CAD (coronary artery disease), CABG 1998, STENTS MULTIPLE SITES SINCE.    [place] Hypertension     Past Medical History:  Diagnosis Date  . Abdominal aortic aneurysm ([place])   . Anemia, improved 01/11/2013  . Basal cell carcinoma   . CAD (coronary artery  disease)    hx CABG 1998, last cath 2007 with PCI and stenting to the proximal segment of the vein graft to the diagonal branch. DES Taxus was placed  . Cervicalgia   . Chest wall pain   . Chronic fatigue fibromyalgia syndrome   . Chronic pain syndrome   . Coronary artery disease   . Depression   .  Fibromyalgia   . Fibromyalgia   . History of nuclear stress test 08/07/2011   lexiscan; no evidence of ischemia or infarct; low risk   . Hyperlipidemia   . Hypertension   . Hypertension   . Obstructive sleep apnea   . OSA (obstructive sleep apnea)   . PAD (peripheral artery disease) ([place])   . Pes anserine bursitis   . Pulmonary embolism ([place]) 01/11/2013  . S/P resection of aortic aneurysm   . Trochanteric bursitis   . Ulcerative colitis   . Ulcerative colitis ([place])     Family History  Problem Relation Age of Onset  . Heart disease Father   . Heart attack Father        @ 40  . Cancer Paternal Aunt        BREAST  . Heart failure Mother        at 51  . Healthy Sister   . Cancer Brother        pancreatic   . Heart failure Maternal Grandmother   . Heart attack Maternal Grandfather   . Heart attack Paternal Grandmother        at 73  . Healthy Daughter   . Healthy Daughter    Past Surgical History:  Procedure Laterality Date  . [place]  . CHOLECYSTECTOMY  1997  . CORONARY ANGIOPLASTY WITH STENT PLACEMENT  2006   to LAD, ATRETIC LIMA AT THAT TIME ALSO  . CORONARY ANGIOPLASTY WITH STENT PLACEMENT  2007   STENT TO PROX VG-DIAG, OTHER STENTS PATENT  . CORONARY ARTERY BYPASS GRAFT  1998   LIMA-LAD;VG-DIAG & RCA  . CORONARY STENT PLACEMENT  2005   TO LAD & LCX-STENTS,  . HERNIA REPAIR    . TUBAL LIGATION     BTL   Social History   Social History Narrative  . Not on file     Objective: Vital Signs: BP (!) 122/58 (BP Location: Left Arm, Patient Position: Sitting, Cuff Size: Normal)   Pulse 64   Resp 14   Ht 5\' 4"  (1.626 m)   Wt 184 lb (83.5 kg)   BMI 31.58 kg/m    Physical Exam  Constitutional: She is oriented to person, place, and time. She appears well-developed and well-nourished.  HENT:  Head: Normocephalic and atraumatic.  Eyes: Conjunctivae and EOM are normal.  Neck: Normal range of motion.  Cardiovascular: Normal rate, regular  rhythm, normal heart sounds and intact distal pulses.  Pulmonary/Chest: Effort normal and breath sounds normal.  Abdominal: Soft. Bowel sounds are normal.  Lymphadenopathy:    She has no cervical adenopathy.  Neurological: She is alert and oriented to person, place, and time.  Skin: Skin is warm and dry. Capillary refill takes less than 2 seconds.  Psychiatric: She has a normal mood and affect. Her behavior is normal.  Nursing note and vitals reviewed.    Musculoskeletal Exam: C-spine some discomfort with range of motion.  She has tenderness over bilateral SI joints.  She has bilateral trapezius spasm.  She is some thoracic kyphosis.  Shoulder joints, elbow joints, wrist joints were  in good range of motion.  She does have [place] PIP/DIP thickening consistent with osteoarthritis.  She has tenderness over bilateral trochanteric bursa consistent with trochanteric bursitis.  Her knee joints were in good range of motion without any warmth swelling or effusion.  Fibromyalgia tender points were 12 out of 18 positive.  CDAI Exam: No CDAI exam completed.    Investigation: No additional findings.   Imaging: No results found.  Speciality Comments: No specialty comments available.    Procedures:  No procedures performed Allergies: Atenolol; Bactrim [sulfamethoxazole-trimethoprim]; Cymbalta [duloxetine hcl]; Lyrica [pregabalin]; and Penicillins   Assessment / Plan:     Visit Diagnoses: Fibromyalgia patient has some generalized pain discomfort and positive tender points.  She has been exercising on regular basis and taking pain medications.  Cervical spondylolysis: She has ongoing discomfort in her C-spine with trapezius pain.  Primary osteoarthritis of both hands: Joint protection muscle strengthening was discussed.  Trochanteric bursitis of both hips: She continues to have pain and discomfort in her bilateral trochanteric bursa.  Stretching exercises were discussed.  I will also refer her to  physical therapy.  Primary insomnia: Ambien has been helpful.  Sacro-iliac pain: She will get physical therapy for SI joint pain.  Other medical problems are listed as follows:  History of depression  History of sleep apnea  History of ulcerative colitis  History of hypertension  History of hyperlipidemia  History of coronary artery disease - status post CABG    Orders: No orders of the defined types were placed in this encounter.  No orders of the defined types were placed in this encounter.     Follow-Up Instructions: Return in about 6 months (around 05/12/2018) for [place] DDD.   Bo Merino, MD  Note - This record has been created using Editor, commissioning.  Chart creation errors have been sought, but may not always  have been located. Such creation errors do not reflect on  the standard of medical care.

## 2017-11-10 ENCOUNTER — Other Ambulatory Visit (INDEPENDENT_AMBULATORY_CARE_PROVIDER_SITE_OTHER): Payer: Self-pay | Admitting: *Deleted

## 2017-11-10 ENCOUNTER — Ambulatory Visit (INDEPENDENT_AMBULATORY_CARE_PROVIDER_SITE_OTHER): Payer: Medicare Other | Admitting: Rheumatology

## 2017-11-10 ENCOUNTER — Encounter: Payer: Self-pay | Admitting: Rheumatology

## 2017-11-10 VITALS — BP 122/58 | HR 64 | Resp 14 | Ht 64.0 in | Wt 184.0 lb

## 2017-11-10 DIAGNOSIS — M7061 Trochanteric bursitis, right hip: Secondary | ICD-10-CM

## 2017-11-10 DIAGNOSIS — Z8679 Personal history of other diseases of the circulatory system: Secondary | ICD-10-CM

## 2017-11-10 DIAGNOSIS — Z8719 Personal history of other diseases of the digestive system: Secondary | ICD-10-CM | POA: Diagnosis not present

## 2017-11-10 DIAGNOSIS — F5101 Primary insomnia: Secondary | ICD-10-CM

## 2017-11-10 DIAGNOSIS — Z8659 Personal history of other mental and behavioral disorders: Secondary | ICD-10-CM

## 2017-11-10 DIAGNOSIS — M7062 Trochanteric bursitis, left hip: Secondary | ICD-10-CM

## 2017-11-10 DIAGNOSIS — M19041 Primary osteoarthritis, right hand: Secondary | ICD-10-CM | POA: Diagnosis not present

## 2017-11-10 DIAGNOSIS — M533 Sacrococcygeal disorders, not elsewhere classified: Secondary | ICD-10-CM

## 2017-11-10 DIAGNOSIS — M4302 Spondylolysis, cervical region: Secondary | ICD-10-CM

## 2017-11-10 DIAGNOSIS — Z8669 Personal history of other diseases of the nervous system and sense organs: Secondary | ICD-10-CM

## 2017-11-10 DIAGNOSIS — M797 Fibromyalgia: Secondary | ICD-10-CM

## 2017-11-10 DIAGNOSIS — M19042 Primary osteoarthritis, left hand: Secondary | ICD-10-CM | POA: Diagnosis not present

## 2017-11-10 DIAGNOSIS — Z8639 Personal history of other endocrine, nutritional and metabolic disease: Secondary | ICD-10-CM | POA: Diagnosis not present

## 2017-11-13 DIAGNOSIS — J069 Acute upper respiratory infection, unspecified: Secondary | ICD-10-CM | POA: Diagnosis not present

## 2017-11-13 DIAGNOSIS — J029 Acute pharyngitis, unspecified: Secondary | ICD-10-CM | POA: Diagnosis not present

## 2017-11-17 DIAGNOSIS — L720 Epidermal cyst: Secondary | ICD-10-CM | POA: Diagnosis not present

## 2017-11-17 DIAGNOSIS — Z85828 Personal history of other malignant neoplasm of skin: Secondary | ICD-10-CM | POA: Diagnosis not present

## 2017-11-17 DIAGNOSIS — D220 Melanocytic nevi of lip: Secondary | ICD-10-CM | POA: Diagnosis not present

## 2017-11-17 DIAGNOSIS — D1801 Hemangioma of skin and subcutaneous tissue: Secondary | ICD-10-CM | POA: Diagnosis not present

## 2017-11-17 DIAGNOSIS — L821 Other seborrheic keratosis: Secondary | ICD-10-CM | POA: Diagnosis not present

## 2017-11-17 DIAGNOSIS — D1722 Benign lipomatous neoplasm of skin and subcutaneous tissue of left arm: Secondary | ICD-10-CM | POA: Diagnosis not present

## 2017-11-17 DIAGNOSIS — D1721 Benign lipomatous neoplasm of skin and subcutaneous tissue of right arm: Secondary | ICD-10-CM | POA: Diagnosis not present

## 2017-11-18 DIAGNOSIS — M6289 Other specified disorders of muscle: Secondary | ICD-10-CM | POA: Diagnosis not present

## 2017-11-18 DIAGNOSIS — M62838 Other muscle spasm: Secondary | ICD-10-CM | POA: Diagnosis not present

## 2017-11-18 DIAGNOSIS — M6281 Muscle weakness (generalized): Secondary | ICD-10-CM | POA: Diagnosis not present

## 2017-11-18 DIAGNOSIS — M7062 Trochanteric bursitis, left hip: Secondary | ICD-10-CM | POA: Diagnosis not present

## 2017-11-18 DIAGNOSIS — M7061 Trochanteric bursitis, right hip: Secondary | ICD-10-CM | POA: Diagnosis not present

## 2017-12-03 DIAGNOSIS — M6281 Muscle weakness (generalized): Secondary | ICD-10-CM | POA: Diagnosis not present

## 2017-12-03 DIAGNOSIS — M7062 Trochanteric bursitis, left hip: Secondary | ICD-10-CM | POA: Diagnosis not present

## 2017-12-03 DIAGNOSIS — M62838 Other muscle spasm: Secondary | ICD-10-CM | POA: Diagnosis not present

## 2017-12-03 DIAGNOSIS — M7061 Trochanteric bursitis, right hip: Secondary | ICD-10-CM | POA: Diagnosis not present

## 2017-12-03 DIAGNOSIS — M6289 Other specified disorders of muscle: Secondary | ICD-10-CM | POA: Diagnosis not present

## 2017-12-10 DIAGNOSIS — M6289 Other specified disorders of muscle: Secondary | ICD-10-CM | POA: Diagnosis not present

## 2017-12-10 DIAGNOSIS — M7062 Trochanteric bursitis, left hip: Secondary | ICD-10-CM | POA: Diagnosis not present

## 2017-12-10 DIAGNOSIS — M7061 Trochanteric bursitis, right hip: Secondary | ICD-10-CM | POA: Diagnosis not present

## 2017-12-10 DIAGNOSIS — M62838 Other muscle spasm: Secondary | ICD-10-CM | POA: Diagnosis not present

## 2017-12-10 DIAGNOSIS — M6281 Muscle weakness (generalized): Secondary | ICD-10-CM | POA: Diagnosis not present

## 2017-12-11 ENCOUNTER — Other Ambulatory Visit: Payer: Self-pay

## 2017-12-11 ENCOUNTER — Encounter: Payer: Self-pay | Admitting: Registered Nurse

## 2017-12-11 ENCOUNTER — Encounter: Payer: Medicare Other | Attending: Physical Medicine and Rehabilitation | Admitting: Registered Nurse

## 2017-12-11 VITALS — BP 131/77 | HR 82 | Ht 64.0 in | Wt 184.2 lb

## 2017-12-11 DIAGNOSIS — M25512 Pain in left shoulder: Secondary | ICD-10-CM

## 2017-12-11 DIAGNOSIS — M7061 Trochanteric bursitis, right hip: Secondary | ICD-10-CM | POA: Diagnosis not present

## 2017-12-11 DIAGNOSIS — R5382 Chronic fatigue, unspecified: Secondary | ICD-10-CM | POA: Diagnosis not present

## 2017-12-11 DIAGNOSIS — Z5181 Encounter for therapeutic drug level monitoring: Secondary | ICD-10-CM

## 2017-12-11 DIAGNOSIS — M25511 Pain in right shoulder: Secondary | ICD-10-CM

## 2017-12-11 DIAGNOSIS — M7062 Trochanteric bursitis, left hip: Secondary | ICD-10-CM | POA: Diagnosis not present

## 2017-12-11 DIAGNOSIS — G894 Chronic pain syndrome: Secondary | ICD-10-CM

## 2017-12-11 DIAGNOSIS — Z79899 Other long term (current) drug therapy: Secondary | ICD-10-CM | POA: Insufficient documentation

## 2017-12-11 DIAGNOSIS — Z79891 Long term (current) use of opiate analgesic: Secondary | ICD-10-CM | POA: Diagnosis not present

## 2017-12-11 DIAGNOSIS — M545 Low back pain, unspecified: Secondary | ICD-10-CM

## 2017-12-11 DIAGNOSIS — M4302 Spondylolysis, cervical region: Secondary | ICD-10-CM | POA: Diagnosis not present

## 2017-12-11 DIAGNOSIS — M797 Fibromyalgia: Secondary | ICD-10-CM | POA: Diagnosis not present

## 2017-12-11 DIAGNOSIS — G8929 Other chronic pain: Secondary | ICD-10-CM | POA: Diagnosis not present

## 2017-12-11 MED ORDER — HYDROCODONE-ACETAMINOPHEN 5-325 MG PO TABS: ORAL_TABLET | ORAL | 0 refills | Status: DC

## 2017-12-11 NOTE — Progress Notes (Signed)
Subjective:    Patient ID: Kara Bush, female    DOB: Apr 22, 1941, 77 y.o.   MRN: [id number]  HPI: [name] is a 77 year old female who returns for follow up appointment for chronic pain and medication refill. She states her pain is located in her bilateral shoulders, lower back, bilateral hips and reports she has generalizes pain all over. She rates her pain 4. Her current exercise regime is walking, attending pool therapy 2-3 times a week and physical therapy weekly.   [name] Morphine Equivalent is 13.50 MME.  Last UDS was Performed on 10/13/2017, it was consistent.    Pain Inventory Average Pain 4 Pain Right Now 4 My pain is intermittent, dull and aching  In the last 24 hours, has pain interfered with the following? General activity 1 Relation with others 0 Enjoyment of life 0 What TIME of day is your pain at its worst? daytime Sleep (in general) Good  Pain is worse with: bending Pain improves with: medication Relief from Meds: 7  Mobility walk without assistance how many minutes can you walk? 5 ability to climb steps?  yes do you drive?  yes Do you have any goals in this area?  no  Function retired Do you have any goals in this area?  no  Neuro/Psych No problems in this area  Prior Studies Any changes since last visit?  no  Physicians involved in your care Any changes since last visit?  no   Family History  Problem Relation Age of Onset  . Heart disease Father   . Heart attack Father        @ 44  . Cancer Paternal Aunt        BREAST  . Heart failure Mother        at 22  . Healthy Sister   . Cancer Brother        pancreatic   . Heart failure Maternal Grandmother   . Heart attack Maternal Grandfather   . Heart attack Paternal Grandmother        at 51  . Healthy Daughter   . Healthy Daughter    Social History   Socioeconomic History  . Marital status: Divorced    Spouse name: Not on file  . Number of children: 2  .  Years of education: college   . Highest education level: Not on file  Occupational History    Employer: RETIRED  Social Needs  . Financial resource strain: Not on file  . Food insecurity:    Worry: Not on file    Inability: Not on file  . Transportation needs:    Medical: Not on file    Non-medical: Not on file  Tobacco Use  . Smoking status: Former Smoker    Last attempt to quit: 06/29/1990    Years since quitting: 27.4  . Smokeless tobacco: Never Used  Substance and Sexual Activity  . Alcohol use: No  . Drug use: No  . Sexual activity: Not on file  Lifestyle  . Physical activity:    Days per week: Not on file    Minutes per session: Not on file  . Stress: Not on file  Relationships  . Social connections:    Talks on phone: Not on file    Gets together: Not on file    Attends religious service: Not on file    Active member of club or organization: Not on file    Attends meetings of  clubs or organizations: Not on file    Relationship status: Not on file  Other Topics Concern  . Not on file  Social History Narrative  . Not on file   Past Surgical History:  Procedure Laterality Date  . [place]  . CHOLECYSTECTOMY  1997  . CORONARY ANGIOPLASTY WITH STENT PLACEMENT  2006   to LAD, ATRETIC LIMA AT THAT TIME ALSO  . CORONARY ANGIOPLASTY WITH STENT PLACEMENT  2007   STENT TO PROX VG-DIAG, OTHER STENTS PATENT  . CORONARY ARTERY BYPASS GRAFT  1998   LIMA-LAD;VG-DIAG & RCA  . CORONARY STENT PLACEMENT  2005   TO LAD & LCX-STENTS,  . HERNIA REPAIR    . TUBAL LIGATION     BTL   Past Medical History:  Diagnosis Date  . Abdominal aortic aneurysm ([place])   . Anemia, improved 01/11/2013  . Basal cell carcinoma   . CAD (coronary artery disease)    hx CABG 1998, last cath 2007 with PCI and stenting to the proximal segment of the vein graft to the diagonal branch. DES Taxus was placed  . Cervicalgia   . Chest wall pain   . Chronic fatigue  fibromyalgia syndrome   . Chronic pain syndrome   . Coronary artery disease   . Depression   . Fibromyalgia   . Fibromyalgia   . History of nuclear stress test 08/07/2011   lexiscan; no evidence of ischemia or infarct; low risk   . Hyperlipidemia   . Hypertension   . Hypertension   . Obstructive sleep apnea   . OSA (obstructive sleep apnea)   . PAD (peripheral artery disease) ([place])   . Pes anserine bursitis   . Pulmonary embolism ([place]) 01/11/2013  . S/P resection of aortic aneurysm   . Trochanteric bursitis   . Ulcerative colitis   . Ulcerative colitis (HCC)    BP 131/77   Pulse 82   Ht 5\' 4"  (1.626 m)   Wt 184 lb 3.2 oz (83.6 kg)   SpO2 96%   BMI 31.62 kg/m   Opioid Risk Score:   Fall Risk Score:  `1  Depression screen PHQ 2/9  Depression screen [facility] 2/9 12/11/2017 08/18/2017 08/21/2015 11/07/2014 05/25/2013  Decreased Interest 0 0 0 0 0  Down, Depressed, Hopeless 0 0 0 0 0  PHQ - 2 Score 0 0 0 0 0  Tired, decreased energy - - - 1 -  Change in appetite - - - 1 -  Feeling bad or failure about yourself  - - - 0 -  Trouble concentrating - - - 0 -  Moving slowly or fidgety/restless - - - 0 -  Suicidal thoughts - - - 0 -    Review of Systems  Constitutional: Negative.   HENT: Negative.   Eyes: Negative.   Respiratory: Negative.   Cardiovascular: Negative.   Gastrointestinal: Negative.   Endocrine: Negative.   Genitourinary: Negative.   Musculoskeletal: Negative.   Skin: Negative.   Allergic/Immunologic: Negative.   Neurological: Negative.   Hematological: Negative.   Psychiatric/Behavioral: Negative.   All other systems reviewed and are negative.      Objective:   Physical Exam  Constitutional: She is oriented to person, place, and time. She appears well-developed and well-nourished.  HENT:  Head: Normocephalic and atraumatic.  Neck: Normal range of motion. Neck supple.  Cardiovascular: Normal rate and regular rhythm.  Pulmonary/Chest: Effort normal and  breath sounds normal.  Musculoskeletal:  Normal Muscle Bulk and  Muscle Testing Reveals: Upper Extremities: Full ROM and Muscle Strength 5/5 Thoracic Paraspinal Tenderness: T-3-T-7 Lower Extremities: Full ROM and Muscle Strength 5/5 Arises from chair with ease Narrow Based Gait  Neurological: She is alert and oriented to person, place, and time.  Skin: Skin is warm and dry.  Psychiatric: She has a normal mood and affect.  Nursing note and vitals reviewed.         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 12/11/2017 2. Fibromyalgia: Continue Pool therapyand Home Exercise Program. 12/11/2017 3. Bilateral intermittent hand numbness: Carpal tunnel syndrome versus C6 radiculopathy mild: No complaints today. Continue to Monitor. 12/11/2017 4. Chronic Midline Low Back Pain/ LBP: Refilled: Norco 5/325mg  # 90 pills--use one pill tid for pain. A second prescription was sent for the following month. 12/11/2017 We will continue the opioid monitoring program, this consists of regular clinic visits, examinations, urine drug screen, pill counts as well as use of [place] Controlled Substance Reporting System.  5. Muscle Spasm: Continue current medication regimen with Robaxin. 12/11/2017 6.Bilateral Greater Trochanteric Bursitis: Continue to Alternate with heat and ice therapy. Continue current medication regime. 12/11/2017.  20 minutes of face to face patient care time was spent during this visit. All questions were encouraged and answered.  F/U in 2 months

## 2017-12-15 ENCOUNTER — Telehealth: Payer: Self-pay

## 2017-12-15 DIAGNOSIS — M545 Low back pain: Secondary | ICD-10-CM | POA: Diagnosis not present

## 2017-12-15 DIAGNOSIS — M797 Fibromyalgia: Secondary | ICD-10-CM | POA: Diagnosis not present

## 2017-12-15 DIAGNOSIS — M6281 Muscle weakness (generalized): Secondary | ICD-10-CM | POA: Diagnosis not present

## 2017-12-15 DIAGNOSIS — M7061 Trochanteric bursitis, right hip: Secondary | ICD-10-CM | POA: Diagnosis not present

## 2017-12-15 DIAGNOSIS — M62838 Other muscle spasm: Secondary | ICD-10-CM | POA: Diagnosis not present

## 2017-12-15 DIAGNOSIS — M7062 Trochanteric bursitis, left hip: Secondary | ICD-10-CM | POA: Diagnosis not present

## 2017-12-15 NOTE — Telephone Encounter (Signed)
Pt said she was returning call but did not say to who.

## 2017-12-16 NOTE — Telephone Encounter (Signed)
Called pt back and she states her needs have been met.

## 2017-12-24 DIAGNOSIS — M62838 Other muscle spasm: Secondary | ICD-10-CM | POA: Diagnosis not present

## 2017-12-24 DIAGNOSIS — M7062 Trochanteric bursitis, left hip: Secondary | ICD-10-CM | POA: Diagnosis not present

## 2017-12-24 DIAGNOSIS — M797 Fibromyalgia: Secondary | ICD-10-CM | POA: Diagnosis not present

## 2017-12-24 DIAGNOSIS — M7061 Trochanteric bursitis, right hip: Secondary | ICD-10-CM | POA: Diagnosis not present

## 2017-12-24 DIAGNOSIS — M6281 Muscle weakness (generalized): Secondary | ICD-10-CM | POA: Diagnosis not present

## 2017-12-24 DIAGNOSIS — M545 Low back pain: Secondary | ICD-10-CM | POA: Diagnosis not present

## 2017-12-31 DIAGNOSIS — M7062 Trochanteric bursitis, left hip: Secondary | ICD-10-CM | POA: Diagnosis not present

## 2017-12-31 DIAGNOSIS — M6289 Other specified disorders of muscle: Secondary | ICD-10-CM | POA: Diagnosis not present

## 2017-12-31 DIAGNOSIS — M62838 Other muscle spasm: Secondary | ICD-10-CM | POA: Diagnosis not present

## 2017-12-31 DIAGNOSIS — M7061 Trochanteric bursitis, right hip: Secondary | ICD-10-CM | POA: Diagnosis not present

## 2017-12-31 DIAGNOSIS — M6281 Muscle weakness (generalized): Secondary | ICD-10-CM | POA: Diagnosis not present

## 2017-12-31 DIAGNOSIS — M545 Low back pain: Secondary | ICD-10-CM | POA: Diagnosis not present

## 2018-01-07 DIAGNOSIS — M7061 Trochanteric bursitis, right hip: Secondary | ICD-10-CM | POA: Diagnosis not present

## 2018-01-07 DIAGNOSIS — M7062 Trochanteric bursitis, left hip: Secondary | ICD-10-CM | POA: Diagnosis not present

## 2018-01-07 DIAGNOSIS — M797 Fibromyalgia: Secondary | ICD-10-CM | POA: Diagnosis not present

## 2018-01-07 DIAGNOSIS — M6281 Muscle weakness (generalized): Secondary | ICD-10-CM | POA: Diagnosis not present

## 2018-01-07 DIAGNOSIS — M545 Low back pain: Secondary | ICD-10-CM | POA: Diagnosis not present

## 2018-01-07 DIAGNOSIS — M62838 Other muscle spasm: Secondary | ICD-10-CM | POA: Diagnosis not present

## 2018-01-19 DIAGNOSIS — M62838 Other muscle spasm: Secondary | ICD-10-CM | POA: Diagnosis not present

## 2018-01-19 DIAGNOSIS — M797 Fibromyalgia: Secondary | ICD-10-CM | POA: Diagnosis not present

## 2018-01-19 DIAGNOSIS — M6281 Muscle weakness (generalized): Secondary | ICD-10-CM | POA: Diagnosis not present

## 2018-01-19 DIAGNOSIS — M7061 Trochanteric bursitis, right hip: Secondary | ICD-10-CM | POA: Diagnosis not present

## 2018-01-19 DIAGNOSIS — M7062 Trochanteric bursitis, left hip: Secondary | ICD-10-CM | POA: Diagnosis not present

## 2018-01-19 DIAGNOSIS — M545 Low back pain: Secondary | ICD-10-CM | POA: Diagnosis not present

## 2018-02-02 DIAGNOSIS — N390 Urinary tract infection, site not specified: Secondary | ICD-10-CM | POA: Diagnosis not present

## 2018-02-02 DIAGNOSIS — R350 Frequency of micturition: Secondary | ICD-10-CM | POA: Diagnosis not present

## 2018-02-10 ENCOUNTER — Ambulatory Visit: Payer: Medicare Other | Admitting: Registered Nurse

## 2018-02-11 DIAGNOSIS — M545 Low back pain: Secondary | ICD-10-CM | POA: Diagnosis not present

## 2018-02-11 DIAGNOSIS — M7062 Trochanteric bursitis, left hip: Secondary | ICD-10-CM | POA: Diagnosis not present

## 2018-02-11 DIAGNOSIS — M6281 Muscle weakness (generalized): Secondary | ICD-10-CM | POA: Diagnosis not present

## 2018-02-11 DIAGNOSIS — M797 Fibromyalgia: Secondary | ICD-10-CM | POA: Diagnosis not present

## 2018-02-11 DIAGNOSIS — M7061 Trochanteric bursitis, right hip: Secondary | ICD-10-CM | POA: Diagnosis not present

## 2018-02-11 DIAGNOSIS — M62838 Other muscle spasm: Secondary | ICD-10-CM | POA: Diagnosis not present

## 2018-02-12 ENCOUNTER — Encounter: Payer: Self-pay | Admitting: Registered Nurse

## 2018-02-12 ENCOUNTER — Encounter: Payer: Medicare Other | Attending: Physical Medicine and Rehabilitation | Admitting: Registered Nurse

## 2018-02-12 ENCOUNTER — Other Ambulatory Visit: Payer: Self-pay

## 2018-02-12 VITALS — BP 121/71 | HR 85 | Ht 64.0 in | Wt 184.6 lb

## 2018-02-12 DIAGNOSIS — M545 Low back pain: Secondary | ICD-10-CM | POA: Insufficient documentation

## 2018-02-12 DIAGNOSIS — G894 Chronic pain syndrome: Secondary | ICD-10-CM | POA: Diagnosis not present

## 2018-02-12 DIAGNOSIS — M4302 Spondylolysis, cervical region: Secondary | ICD-10-CM | POA: Insufficient documentation

## 2018-02-12 DIAGNOSIS — M7062 Trochanteric bursitis, left hip: Secondary | ICD-10-CM

## 2018-02-12 DIAGNOSIS — M25511 Pain in right shoulder: Secondary | ICD-10-CM | POA: Diagnosis not present

## 2018-02-12 DIAGNOSIS — G8929 Other chronic pain: Secondary | ICD-10-CM | POA: Diagnosis not present

## 2018-02-12 DIAGNOSIS — Z5181 Encounter for therapeutic drug level monitoring: Secondary | ICD-10-CM | POA: Diagnosis not present

## 2018-02-12 DIAGNOSIS — R5382 Chronic fatigue, unspecified: Secondary | ICD-10-CM | POA: Insufficient documentation

## 2018-02-12 DIAGNOSIS — M797 Fibromyalgia: Secondary | ICD-10-CM

## 2018-02-12 DIAGNOSIS — Z79891 Long term (current) use of opiate analgesic: Secondary | ICD-10-CM

## 2018-02-12 DIAGNOSIS — M7061 Trochanteric bursitis, right hip: Secondary | ICD-10-CM

## 2018-02-12 DIAGNOSIS — Z79899 Other long term (current) drug therapy: Secondary | ICD-10-CM | POA: Diagnosis not present

## 2018-02-12 MED ORDER — HYDROCODONE-ACETAMINOPHEN 5-325 MG PO TABS: ORAL_TABLET | ORAL | 0 refills | Status: DC

## 2018-02-12 NOTE — Progress Notes (Signed)
Subjective:    Patient ID: Kara Bush, female    DOB: 12/30/1940, 77 y.o.   MRN: [id number]  HPI: [name] is a 77 year old female who returns for follow up appointment for chronic pain and medication refill. She states her pain is located in her right shoulder , lower back and bilateral hips. She rates her pain 3. Her current exercise regime is pool therapy and walking.  Ms. Aller Morphine Equivalent is 15.00 MME. Last UDS was Performed on 10/13/2017, it was consistent.   Pain Inventory Average Pain 4 Pain Right Now 3 My pain is aching  In the last 24 hours, has pain interfered with the following? General activity 4 Relation with others 0 Enjoyment of life 1 What TIME of day is your pain at its worst? morning Sleep (in general) Good  Pain is worse with: some activites Pain improves with: medication Relief from Meds: 3  Mobility Do you have any goals in this area?  no  Function Do you have any goals in this area?  no  Neuro/Psych No problems in this area  Prior Studies Any changes since last visit?  no  Physicians involved in your care Any changes since last visit?  no   Family History  Problem Relation Age of Onset  . Heart disease Father   . Heart attack Father        @ 39  . Cancer Paternal Aunt        BREAST  . Heart failure Mother        at 67  . Healthy Sister   . Cancer Brother        pancreatic   . Heart failure Maternal Grandmother   . Heart attack Maternal Grandfather   . Heart attack Paternal Grandmother        at 42  . Healthy Daughter   . Healthy Daughter    Social History   Socioeconomic History  . Marital status: Divorced    Spouse name: Not on file  . Number of children: 2  . Years of education: college   . Highest education level: Not on file  Occupational History    Employer: RETIRED  Social Needs  . Financial resource strain: Not on file  . Food insecurity:    Worry: Not on file    Inability: Not on  file  . Transportation needs:    Medical: Not on file    Non-medical: Not on file  Tobacco Use  . Smoking status: Former Smoker    Last attempt to quit: 06/29/1990    Years since quitting: 27.6  . Smokeless tobacco: Never Used  Substance and Sexual Activity  . Alcohol use: No  . Drug use: No  . Sexual activity: Not on file  Lifestyle  . Physical activity:    Days per week: Not on file    Minutes per session: Not on file  . Stress: Not on file  Relationships  . Social connections:    Talks on phone: Not on file    Gets together: Not on file    Attends religious service: Not on file    Active member of club or organization: Not on file    Attends meetings of clubs or organizations: Not on file    Relationship status: Not on file  Other Topics Concern  . Not on file  Social History Narrative  . Not on file   Past Surgical History:  Procedure Laterality Date  .  [place]  . CHOLECYSTECTOMY  1997  . CORONARY ANGIOPLASTY WITH STENT PLACEMENT  2006   to LAD, ATRETIC LIMA AT THAT TIME ALSO  . CORONARY ANGIOPLASTY WITH STENT PLACEMENT  2007   STENT TO PROX VG-DIAG, OTHER STENTS PATENT  . CORONARY ARTERY BYPASS GRAFT  1998   LIMA-LAD;VG-DIAG & RCA  . CORONARY STENT PLACEMENT  2005   TO LAD & LCX-STENTS,  . HERNIA REPAIR    . TUBAL LIGATION     BTL   Past Medical History:  Diagnosis Date  . Abdominal aortic aneurysm ([place])   . Anemia, improved 01/11/2013  . Basal cell carcinoma   . CAD (coronary artery disease)    hx CABG 1998, last cath 2007 with PCI and stenting to the proximal segment of the vein graft to the diagonal branch. DES Taxus was placed  . Cervicalgia   . Chest wall pain   . Chronic fatigue fibromyalgia syndrome   . Chronic pain syndrome   . Coronary artery disease   . Depression   . Fibromyalgia   . Fibromyalgia   . History of nuclear stress test 08/07/2011   lexiscan; no evidence of ischemia or infarct; low risk   .  Hyperlipidemia   . Hypertension   . Hypertension   . Obstructive sleep apnea   . OSA (obstructive sleep apnea)   . PAD (peripheral artery disease) ([place])   . Pes anserine bursitis   . Pulmonary embolism ([place]) 01/11/2013  . S/P resection of aortic aneurysm   . Trochanteric bursitis   . Ulcerative colitis   . Ulcerative colitis (HCC)    BP 121/71   Pulse 85   Ht 5\' 4"  (1.626 m)   Wt 184 lb 9.6 oz (83.7 kg)   SpO2 94%   BMI 31.69 kg/m   Opioid Risk Score:   Fall Risk Score:  `1  Depression screen PHQ 2/9  Depression screen [facility] 2/9 02/12/2018 12/11/2017 08/18/2017 08/21/2015 11/07/2014 05/25/2013  Decreased Interest 0 0 0 0 0 0  Down, Depressed, Hopeless 0 0 0 0 0 0  PHQ - 2 Score 0 0 0 0 0 0  Tired, decreased energy - - - - 1 -  Change in appetite - - - - 1 -  Feeling bad or failure about yourself  - - - - 0 -  Trouble concentrating - - - - 0 -  Moving slowly or fidgety/restless - - - - 0 -  Suicidal thoughts - - - - 0 -   Review of Systems  Constitutional: Negative.   HENT: Negative.   Eyes: Negative.   Respiratory: Negative.   Cardiovascular: Negative.   Gastrointestinal: Negative.   Endocrine: Negative.   Genitourinary: Negative.   Musculoskeletal: Negative.   Skin: Negative.   Allergic/Immunologic: Negative.   Neurological: Negative.   Hematological: Negative.   Psychiatric/Behavioral: Negative.   All other systems reviewed and are negative.      Objective:   Physical Exam  Constitutional: She is oriented to person, place, and time. She appears well-developed and well-nourished.  HENT:  Head: Normocephalic and atraumatic.  Neck: Normal range of motion. Neck supple.  Cardiovascular: Normal rate and regular rhythm.  Pulmonary/Chest: Effort normal and breath sounds normal.  Musculoskeletal:  Normal Muscle Bulk and Muscle Testing Reveals: Upper Extremities: Full ROM and Muscle Strength 5/5 Lumbar Paraspinal Tenderness: L-4-L-5 Bilateral Greater Trochanter  Tenderness Lower Extremities: Full ROM and Muscle Strength 5/5 Arises from Table with ease Narrow Based  Gait  Neurological: She is alert and oriented to person, place, and time.  Skin: Skin is warm and dry.  Psychiatric: She has a normal mood and affect. Her behavior is normal.  Nursing note and vitals reviewed.         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 Fibromyalgia: Continue Pool therapyand Home Exercise Program.02/12/2018 3. Bilateral intermittent hand numbness: Carpal tunnel syndrome versus C6 radiculopathy mild:No complaints today.Continue to Monitor.02/12/2018 4.Chronic Midline Low Back Pain/LBP: Refilled: Norco 5/325mg  # 90 pills--use one pill tid for pain. A second prescription was e-scribe for the following month.02/12/2018 We will continue the opioid monitoring program, this consists of regular clinic visits, examinations, urine drug screen, pill counts as well as use of [place] Controlled Substance Reporting System.  5. Muscle Spasm: Continue current medication regimen with Robaxin.02/12/2018 6.Bilateral Greater Trochanteric Bursitis: Continue to Alternate with heat and ice therapy. Continue current medication regime. 02/12/2018.  20 minutes of face to face patient care time was spent during this visit. All questions were encouraged and answered.  F/U in 2 months

## 2018-04-13 ENCOUNTER — Other Ambulatory Visit: Payer: Self-pay

## 2018-04-13 ENCOUNTER — Encounter: Payer: Self-pay | Admitting: Physical Medicine & Rehabilitation

## 2018-04-13 ENCOUNTER — Encounter: Payer: Medicare Other | Attending: Physical Medicine and Rehabilitation | Admitting: Physical Medicine & Rehabilitation

## 2018-04-13 VITALS — BP 147/77 | HR 86 | Ht 64.0 in | Wt 182.0 lb

## 2018-04-13 DIAGNOSIS — Z79899 Other long term (current) drug therapy: Secondary | ICD-10-CM | POA: Insufficient documentation

## 2018-04-13 DIAGNOSIS — M797 Fibromyalgia: Secondary | ICD-10-CM | POA: Insufficient documentation

## 2018-04-13 DIAGNOSIS — M4302 Spondylolysis, cervical region: Secondary | ICD-10-CM | POA: Insufficient documentation

## 2018-04-13 DIAGNOSIS — R5382 Chronic fatigue, unspecified: Secondary | ICD-10-CM | POA: Insufficient documentation

## 2018-04-13 DIAGNOSIS — M545 Low back pain: Secondary | ICD-10-CM | POA: Insufficient documentation

## 2018-04-13 DIAGNOSIS — Z79891 Long term (current) use of opiate analgesic: Secondary | ICD-10-CM | POA: Diagnosis not present

## 2018-04-13 DIAGNOSIS — G894 Chronic pain syndrome: Secondary | ICD-10-CM

## 2018-04-13 DIAGNOSIS — Z5181 Encounter for therapeutic drug level monitoring: Secondary | ICD-10-CM | POA: Insufficient documentation

## 2018-04-13 MED ORDER — HYDROCODONE-ACETAMINOPHEN 5-325 MG PO TABS: ORAL_TABLET | ORAL | 0 refills | Status: DC

## 2018-04-13 NOTE — Patient Instructions (Signed)
PLEASE FEEL FREE TO CALL OUR OFFICE WITH ANY PROBLEMS OR QUESTIONS ([phone])      

## 2018-04-13 NOTE — Progress Notes (Signed)
Subjective:    Patient ID: Kara Bush, female    DOB: 01/28/1941, 77 y.o.   MRN: [id number]  HPI   Kara Bush is here regarding her chronic pain. Her low back pain remains her biggest issue. She generally keeps active and does water aerobic 3x weekly..  Her dog recently had to be put down which has thrown her off her schedule.   She remains on hydrocodone for pain.  She takes this 3 times a day.  This seems to help with her quality of life.  She is tolerating medication well.  She moves her bowels and bladder without issues.   Pain Inventory Average Pain 4 Pain Right Now 4 My pain is aching  In the last 24 hours, has pain interfered with the following? General activity 4 Relation with others 0 Enjoyment of life 2 What TIME of day is your pain at its worst? morning Sleep (in general) Good  Pain is worse with: bending and some activites Pain improves with: medication Relief from Meds: 5  Mobility walk without assistance Do you have any goals in this area?  no  Function Do you have any goals in this area?  no  Neuro/Psych No problems in this area  Prior Studies Any changes since last visit?  no  Physicians involved in your care Any changes since last visit?  no   Family History  Problem Relation Age of Onset  . Heart disease Father   . Heart attack Father        @ 45  . Cancer Paternal Aunt        BREAST  . Heart failure Mother        at 56  . Healthy Sister   . Cancer Brother        pancreatic   . Heart failure Maternal Grandmother   . Heart attack Maternal Grandfather   . Heart attack Paternal Grandmother        at 64  . Healthy Daughter   . Healthy Daughter    Social History   Socioeconomic History  . Marital status: Divorced    Spouse name: Not on file  . Number of children: 2  . Years of education: college   . Highest education level: Not on file  Occupational History    Employer: RETIRED  Social Needs  . Financial resource  strain: Not on file  . Food insecurity:    Worry: Not on file    Inability: Not on file  . Transportation needs:    Medical: Not on file    Non-medical: Not on file  Tobacco Use  . Smoking status: Former Smoker    Last attempt to quit: 06/29/1990    Years since quitting: 27.8  . Smokeless tobacco: Never Used  Substance and Sexual Activity  . Alcohol use: No  . Drug use: No  . Sexual activity: Not on file  Lifestyle  . Physical activity:    Days per week: Not on file    Minutes per session: Not on file  . Stress: Not on file  Relationships  . Social connections:    Talks on phone: Not on file    Gets together: Not on file    Attends religious service: Not on file    Active member of club or organization: Not on file    Attends meetings of clubs or organizations: Not on file    Relationship status: Not on file  Other Topics Concern  .  Not on file  Social History Narrative  . Not on file   Past Surgical History:  Procedure Laterality Date  . [place]  . CHOLECYSTECTOMY  1997  . CORONARY ANGIOPLASTY WITH STENT PLACEMENT  2006   to LAD, ATRETIC LIMA AT THAT TIME ALSO  . CORONARY ANGIOPLASTY WITH STENT PLACEMENT  2007   STENT TO PROX VG-DIAG, OTHER STENTS PATENT  . CORONARY ARTERY BYPASS GRAFT  1998   LIMA-LAD;VG-DIAG & RCA  . CORONARY STENT PLACEMENT  2005   TO LAD & LCX-STENTS,  . HERNIA REPAIR    . TUBAL LIGATION     BTL   Past Medical History:  Diagnosis Date  . Abdominal aortic aneurysm ([place])   . Anemia, improved 01/11/2013  . Basal cell carcinoma   . CAD (coronary artery disease)    hx CABG 1998, last cath 2007 with PCI and stenting to the proximal segment of the vein graft to the diagonal branch. DES Taxus was placed  . Cervicalgia   . Chest wall pain   . Chronic fatigue fibromyalgia syndrome   . Chronic pain syndrome   . Coronary artery disease   . Depression   . Fibromyalgia   . Fibromyalgia   . History of nuclear stress  test 08/07/2011   lexiscan; no evidence of ischemia or infarct; low risk   . Hyperlipidemia   . Hypertension   . Hypertension   . Obstructive sleep apnea   . OSA (obstructive sleep apnea)   . PAD (peripheral artery disease) ([place])   . Pes anserine bursitis   . Pulmonary embolism ([place]) 01/11/2013  . S/P resection of aortic aneurysm   . Trochanteric bursitis   . Ulcerative colitis   . Ulcerative colitis (HCC)    BP (!) 147/77   Pulse 86   Ht 5\' 4"  (1.626 m)   Wt 182 lb (82.6 kg)   SpO2 94%   BMI 31.24 kg/m   Opioid Risk Score:   Fall Risk Score:  `1  Depression screen PHQ 2/9  Depression screen [facility] 2/9 04/13/2018 02/12/2018 12/11/2017 08/18/2017 08/21/2015 11/07/2014 05/25/2013  Decreased Interest 0 0 0 0 0 0 0  Down, Depressed, Hopeless 0 0 0 0 0 0 0  PHQ - 2 Score 0 0 0 0 0 0 0  Tired, decreased energy - - - - - 1 -  Change in appetite - - - - - 1 -  Feeling bad or failure about yourself  - - - - - 0 -  Trouble concentrating - - - - - 0 -  Moving slowly or fidgety/restless - - - - - 0 -  Suicidal thoughts - - - - - 0 -   Review of Systems  Constitutional: Negative.   HENT: Negative.   Eyes: Negative.   Respiratory: Negative.   Cardiovascular: Negative.   Gastrointestinal: Negative.   Endocrine: Negative.   Genitourinary: Negative.   Musculoskeletal: Negative.   Skin: Negative.   Allergic/Immunologic: Negative.   Neurological: Negative.   Psychiatric/Behavioral: Negative.   All other systems reviewed and are negative.      Objective:   Physical Exam General: No acute distress HEENT: EOMI, oral membranes moist Cards: reg rate  Chest: normal effort Abdomen: Soft, NT, ND Skin: dry, intact Extremities: no edema  Musculoskeletal:  Fair sitting posture.  Still with head forward positioning.  Stable with ambulation and transfers.  Neurological: She is alert and oriented to person, place, and time.  Skin: Skin  is warm and dry.  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 [place] today.   -UDS today 2. Fibromyalgia:  -fairly well maintained at present.  -continue with aquatic program and general exercise.Needs to stretch more. Discussed posture 3. Bilateral intermittent hand numbness:   4. LBP/lumbar spondylosis and perhaps SIJ and troch bursitis  -refilled Norco 5/325mg  # 90 pills--use one pill tid for pain.Second rx.  5. Follow up withNPin about 2 months. Tenminutes of face to face patient care time were spent during this visit. All questions were encouraged and answered.

## 2018-04-19 ENCOUNTER — Telehealth: Payer: Self-pay | Admitting: *Deleted

## 2018-04-19 LAB — TOXASSURE SELECT,+ANTIDEPR,UR

## 2018-04-19 NOTE — Telephone Encounter (Signed)
Urine drug screen for this encounter is consistent for prescribed medication 

## 2018-04-29 NOTE — Progress Notes (Addendum)
Office Visit Note  Patient: Kara Bush             Date of Birth: 07-18-1941           MRN: [id number]             PCP: Leighton Ruff, MD Referring: Leighton Ruff, MD Visit Date: 05/13/2018 Occupation: @GUAROCC @  Subjective:  Bilateral IT band syndrome   History of Present Illness: Kara Bush is a 77 y.o. female with history of fibromyalgia and osteoarthritis.  She continues to have generalized muscle tenderness due to fibromyalgia.  She states that her fatigue is chronic but worsening recently.  She states that she does sleep well at night and takes Ambien 10 mg at bedtime.  She says she is also tried taking CBD Gummies which cause drowsiness.  She states that she has been going to water exercise classes 2-3 times per week.  She states that she wishes that she could walk more frequently but it causes increased lower back pain.  She reports that she went to physical therapy in the spring at [facility] office which improved her IT band syndrome somewhat.  She continues to perform stretching exercises on a regular basis.  She continues to have bilateral SI joint pain.  She takes hydrocodone for pain relief.  She states that overall her neck is doing well.  She says occasionally if she is positioned incorrectly at night she will have some numbness and tingling in her hands.  She denies any hand pain or swelling at this time.  She states that she would like a handicap placard updated today.  Activities of Daily Living:  Patient reports morning stiffness for 0 none.   Patient Denies nocturnal pain.  Difficulty dressing/grooming: Denies Difficulty climbing stairs: Reports Difficulty getting out of chair: Denies Difficulty using hands for taps, buttons, cutlery, and/or writing: Denies  Review of Systems  Constitutional: Positive for fatigue.  HENT: Negative for mouth sores, mouth dryness and nose dryness.   Eyes: Positive for dryness. Negative for pain and visual  disturbance.  Respiratory: Negative for cough, hemoptysis, shortness of breath and difficulty breathing.   Cardiovascular: Negative for chest pain, palpitations, hypertension and swelling in legs/feet.  Gastrointestinal: Negative for blood in stool, constipation and diarrhea.  Endocrine: Negative for increased urination.  Genitourinary: Negative for difficulty urinating and painful urination.  Musculoskeletal: Positive for arthralgias, joint pain and muscle tenderness. Negative for joint swelling, myalgias, muscle weakness, morning stiffness and myalgias.  Skin: Negative for color change, pallor, rash, hair loss, nodules/bumps, skin tightness, ulcers and sensitivity to sunlight.  Allergic/Immunologic: Negative for susceptible to infections.  Neurological: Negative for dizziness, numbness, headaches and weakness.  Hematological: Negative for bruising/bleeding tendency and swollen glands.  Psychiatric/Behavioral: Negative for depressed mood and sleep disturbance. The patient is not nervous/anxious.     PMFS History:  Patient Active Problem List   Diagnosis Date Noted  . DOE (dyspnea on exertion) 08/10/2017  . 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 Trochanteric bursitis 11/07/2014  . Sacro-iliac pain 11/07/2014  . Low back pain 11/07/2013  . Hepatitis 04/20/2013  . Screening for STD (sexually transmitted disease) 04/20/2013  . Recurrent UTI 04/20/2013  . Abdominal aortic aneurysm ([place])   . Ulcerative colitis ([place])   . Obstructive sleep apnea   . Fibromyalgia   . Coronary artery disease   . Basal cell carcinoma   . Anemia, improved 01/11/2013  . Ascending aortic aneurysm, 4.1 cm 01/11/2013  .  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 Chronic periscapular pain 10/15/2011  . Chest wall pain   . S/P resection of aortic aneurysm, 1998 prior to CABG   . PAD (peripheral artery disease), aortic-iliac bypass 1991, mild carotid bruits   . OSA (obstructive sleep apnea)   . Chronic  fatigue fibromyalgia syndrome   . Hyperlipidemia   . Ulcerative colitis   . Depression   . CAD (coronary artery disease), CABG 1998, STENTS MULTIPLE SITES SINCE.    Marland Kitchen Hypertension     Past Medical History:  Diagnosis Date  . Abdominal aortic aneurysm ([place])   . Anemia, improved 01/11/2013  . Basal cell carcinoma   . CAD (coronary artery disease)    hx CABG 1998, last cath 2007 with PCI and stenting to the proximal segment of the vein graft to the diagonal branch. DES Taxus was placed  . Cervicalgia   . Chest wall pain   . Chronic fatigue fibromyalgia syndrome   . Chronic pain syndrome   . Coronary artery disease   . Depression   . Fibromyalgia   . Fibromyalgia   . History of nuclear stress test 08/07/2011   lexiscan; no evidence of ischemia or infarct; low risk   . Hyperlipidemia   . Hypertension   . Hypertension   . Obstructive sleep apnea   . OSA (obstructive sleep apnea)   . PAD (peripheral artery disease) ([place])   . Pes anserine bursitis   . Pulmonary embolism ([place]) 01/11/2013  . S/P resection of aortic aneurysm   . Trochanteric bursitis   . Ulcerative colitis   . Ulcerative colitis ([place])     Family History  Problem Relation Age of Onset  . Heart disease Father   . Heart attack Father        @ 16  . Cancer Paternal Aunt        BREAST  . Heart failure Mother        at 63  . Healthy Sister   . Cancer Brother        pancreatic   . Heart failure Maternal Grandmother   . Heart attack Maternal Grandfather   . Heart attack Paternal Grandmother        at 69  . Healthy Daughter   . Healthy Daughter    Past Surgical History:  Procedure Laterality Date  . [place]  . CHOLECYSTECTOMY  1997  . CORONARY ANGIOPLASTY WITH STENT PLACEMENT  2006   to LAD, ATRETIC LIMA AT THAT TIME ALSO  . CORONARY ANGIOPLASTY WITH STENT PLACEMENT  2007   STENT TO PROX VG-DIAG, OTHER STENTS PATENT  . CORONARY ARTERY BYPASS GRAFT  1998   LIMA-LAD;VG-DIAG & RCA  .  CORONARY STENT PLACEMENT  2005   TO LAD & LCX-STENTS,  . HERNIA REPAIR    . TUBAL LIGATION     BTL   Social History   Social History Narrative  . Not on file    Objective: Vital Signs: BP (!) 149/76 (BP Location: Left Arm, Patient Position: Sitting, Cuff Size: Normal)   Pulse 85   Resp 18   Ht 5\' 4"  (1.626 m)   Wt 183 lb 6.4 oz (83.2 kg)   BMI 31.48 kg/m    Physical Exam  Constitutional: She is oriented to person, place, and time. She appears well-developed and well-nourished.  HENT:  Head: Normocephalic and atraumatic.  Eyes: Conjunctivae and EOM are normal.  Neck: Normal range of motion.  Cardiovascular: Normal rate, regular  rhythm, normal heart sounds and intact distal pulses.  Pulmonary/Chest: Effort normal and breath sounds normal.  Abdominal: Soft. Bowel sounds are normal.  Lymphadenopathy:    She has no cervical adenopathy.  Neurological: She is alert and oriented to person, place, and time.  Skin: Skin is warm and dry. Capillary refill takes less than 2 seconds.  Psychiatric: She has a normal mood and affect. Her behavior is normal.  Nursing note and vitals reviewed.    Musculoskeletal Exam: C-spine, thoracic spine, lumbar spine good range of motion.  No midline spinal tenderness.  Bilateral SI joint tenderness.  Shoulder joints, elbow joints, wrist joints, MCPs, PIPs, DIPs good range of motion with no synovitis. Tenderness of right lateral epicondyle. She has PIP and DIP synovial thickening consistent with osteoarthritis.  She has bilateral CMC joint synovial thickening.  Hip joints good range of motion with no discomfort.  She has tenderness of bilateral trochanteric bursa and bilateral IT bands.  Good range of motion of bilateral knee joints.  No warmth or effusion noted.  No tenderness or swelling of ankle joints.  MTPs, PIPs, DIPs good range of motion with no synovitis.  No dorsal spurs noted.  CDAI Exam: CDAI Score: Not documented Patient Global Assessment: Not  documented; Provider Global Assessment: Not documented Swollen: Not documented; Tender: Not documented Joint Exam   Not documented   There is currently no information documented on the homunculus. Go to the Rheumatology activity and complete the homunculus joint exam.  Investigation: No additional findings.  Imaging: No results found.  Recent Labs: Lab Results  Component Value Date   WBC 9.3 11/20/2015   HGB 13.1 11/20/2015   PLT 253 11/20/2015   NA 140 07/22/2016   K 4.4 07/22/2016   CL 104 07/22/2016   CO2 28 07/22/2016   GLUCOSE 95 07/22/2016   BUN 21 07/22/2016   CREATININE 0.86 07/22/2016   BILITOT 0.5 11/20/2015   ALKPHOS 40 11/20/2015   AST 25 11/20/2015   ALT 20 11/20/2015   PROT 7.3 11/20/2015   ALBUMIN 4.6 11/20/2015   CALCIUM 9.4 07/22/2016   GFRAA >60 11/20/2015    Speciality Comments: No specialty comments available.  Procedures:  No procedures performed Allergies: Atenolol; Bactrim [sulfamethoxazole-trimethoprim]; Cymbalta [duloxetine hcl]; Lyrica [pregabalin]; and Penicillins   Assessment / Plan:     Visit Diagnoses: Fibromyalgia: She continues to have generalized muscle aches and muscle tenderness due to fibromyalgia.  She has bilateral trochanteric bursitis and IT band syndrome.  She declined a cortisone injection today in the office.  She went to physical therapy in the spring which provided some relief.  She continues to perform stretching exercises on a regular basis.  She was given a handout of IT band stretches as well as trochanter bursitis stretches.  She has tenderness of the right lateral epicondyle.  She was given a handout of stretching exercises that she can perform at home.  We also discussed trying a brace.  She declined a cortisone injection.  She continues to have chronic fatigue.  She takes Ambien 10 mg at bedtime to help her sleep at night.  She was encouraged to continue to go to water aerobics 2-3 times per week and stay active.    Trochanteric bursitis of both hips: She has tenderness of bilateral trochanteric bursa.  She perform stretching exercises on a regular basis.  She was given a handout of exercises that she can attempt at home.  She went to physical therapy in the spring which provided  some relief.  She declined going back to physical therapy at this time.   Primary insomnia - She takes Ambien 10 mg at bedtime.    Cervical spondylolysis: Good ROM with no discomfort. She experiences symptoms of radiculopathy at night if she is positioned incorrectly while sleeping.   Primary osteoarthritis of both hands: She has PIP and DIP synovial thickening consistent with osteoarthritis of bilateral hands.  She has bilateral CMC joint synovial thickening.  She is complete fist formation bilaterally.  No synovitis was noted.  She has no discomfort in her hands at this time.  Joint protection and muscle strengthening were discussed.  Sacro-iliac pain: She has tenderness of bilateral SI joints.  Iliotibial band syndrome both sides: She has tenderness of bilateral IT band.  She went to physical therapy in the spring and reports a massage helped considerably.  She was given a handout of exercises that she should perform at home.  She declined physical therapy at this time.  Lateral epicondylitis, right elbow: She has tenderness on exam.  She is given a handout of exercises that she can perform at home.  We also discussed trying to wear a brace.  She declined a cortisone injection.  Other medical conditions are listed as follows:  History of depression  History of ulcerative colitis  History of hypertension  History of hyperlipidemia  History of coronary artery disease  OSA (obstructive sleep apnea)  Coronary artery disease status post CABG  History of sleep apnea   Orders: No orders of the defined types were placed in this encounter.  No orders of the defined types were placed in this encounter.    Follow-Up  Instructions: Return in about 6 months (around 11/12/2018) for Fibromyalgia, Osteoarthritis.   Ofilia Neas, PA-C  Note - This record has been created using Dragon software.  Chart creation errors have been sought, but may not always  have been located. Such creation errors do not reflect on  the standard of medical care.

## 2018-05-03 DIAGNOSIS — Z23 Encounter for immunization: Secondary | ICD-10-CM | POA: Diagnosis not present

## 2018-05-13 ENCOUNTER — Ambulatory Visit: Payer: PRIVATE HEALTH INSURANCE | Admitting: Rheumatology

## 2018-05-13 ENCOUNTER — Ambulatory Visit (INDEPENDENT_AMBULATORY_CARE_PROVIDER_SITE_OTHER): Payer: Medicare Other | Admitting: Physician Assistant

## 2018-05-13 ENCOUNTER — Encounter: Payer: Self-pay | Admitting: Physician Assistant

## 2018-05-13 ENCOUNTER — Encounter (INDEPENDENT_AMBULATORY_CARE_PROVIDER_SITE_OTHER): Payer: Self-pay

## 2018-05-13 VITALS — BP 149/76 | HR 85 | Resp 18 | Ht 64.0 in | Wt 183.4 lb

## 2018-05-13 DIAGNOSIS — M797 Fibromyalgia: Secondary | ICD-10-CM | POA: Diagnosis not present

## 2018-05-13 DIAGNOSIS — F5101 Primary insomnia: Secondary | ICD-10-CM

## 2018-05-13 DIAGNOSIS — Z8659 Personal history of other mental and behavioral disorders: Secondary | ICD-10-CM | POA: Diagnosis not present

## 2018-05-13 DIAGNOSIS — M4302 Spondylolysis, cervical region: Secondary | ICD-10-CM

## 2018-05-13 DIAGNOSIS — M533 Sacrococcygeal disorders, not elsewhere classified: Secondary | ICD-10-CM | POA: Diagnosis not present

## 2018-05-13 DIAGNOSIS — Z8679 Personal history of other diseases of the circulatory system: Secondary | ICD-10-CM | POA: Diagnosis not present

## 2018-05-13 DIAGNOSIS — G4733 Obstructive sleep apnea (adult) (pediatric): Secondary | ICD-10-CM | POA: Diagnosis not present

## 2018-05-13 DIAGNOSIS — Z8719 Personal history of other diseases of the digestive system: Secondary | ICD-10-CM | POA: Diagnosis not present

## 2018-05-13 DIAGNOSIS — M19041 Primary osteoarthritis, right hand: Secondary | ICD-10-CM

## 2018-05-13 DIAGNOSIS — M7061 Trochanteric bursitis, right hip: Secondary | ICD-10-CM | POA: Diagnosis not present

## 2018-05-13 DIAGNOSIS — M7631 Iliotibial band syndrome, right leg: Secondary | ICD-10-CM

## 2018-05-13 DIAGNOSIS — I2510 Atherosclerotic heart disease of native coronary artery without angina pectoris: Secondary | ICD-10-CM | POA: Diagnosis not present

## 2018-05-13 DIAGNOSIS — Z8639 Personal history of other endocrine, nutritional and metabolic disease: Secondary | ICD-10-CM | POA: Diagnosis not present

## 2018-05-13 DIAGNOSIS — M7062 Trochanteric bursitis, left hip: Secondary | ICD-10-CM

## 2018-05-13 DIAGNOSIS — M19042 Primary osteoarthritis, left hand: Secondary | ICD-10-CM

## 2018-05-13 DIAGNOSIS — M7632 Iliotibial band syndrome, left leg: Secondary | ICD-10-CM

## 2018-05-13 DIAGNOSIS — M7711 Lateral epicondylitis, right elbow: Secondary | ICD-10-CM

## 2018-05-13 DIAGNOSIS — Z8669 Personal history of other diseases of the nervous system and sense organs: Secondary | ICD-10-CM

## 2018-05-13 NOTE — Patient Instructions (Addendum)
Iliotibial Band Syndrome Rehab Ask your health care provider which exercises are safe for you. Do exercises exactly as told by your health care provider and adjust them as directed. It is normal to feel mild stretching, pulling, tightness, or discomfort as you do these exercises, but you should stop right away if you feel sudden pain or your pain gets worse.Do not begin these exercises until told by your health care provider. Stretching and range of motion exercises These exercises warm up your muscles and joints and improve the movement and flexibility of your hip and pelvis. Exercise A: Quadriceps, prone  1. Lie on your abdomen on a firm surface, such as a bed or padded floor. 2. Bend your left / right knee and hold your ankle. If you cannot reach your ankle or pant leg, loop a belt around your foot and grab the belt instead. 3. Gently pull your heel toward your buttocks. Your knee should not slide out to the side. You should feel a stretch in the front of your thigh and knee. 4. Hold this position for __________ seconds. Repeat __________ times. Complete this stretch __________ times a day. Exercise B: Iliotibial band  1. Lie on your side with your left / right leg in the top position. 2. Bend both of your knees and grab your left / right ankle. Stretch out your bottom arm to help you balance. 3. Slowly bring your top knee back so your thigh goes behind your trunk. 4. Slowly lower your top leg toward the floor until you feel a gentle stretch on the outside of your left / right hip and thigh. If you do not feel a stretch and your knee will not fall farther, place the heel of your other foot on top of your knee and pull your knee down toward the floor with your foot. 5. Hold this position for __________ seconds. Repeat __________ times. Complete this stretch __________ times a day. Strengthening exercises These exercises build strength and endurance in your hip and pelvis. Endurance is the  ability to use your muscles for a long time, even after they get tired. Exercise C: Straight leg raises ( hip abductors) 1. Lie on your side with your left / right leg in the top position. Lie so your head, shoulder, knee, and hip line up. You may bend your bottom knee to help you balance. 2. Roll your hips slightly forward so your hips are stacked directly over each other and your left / right knee is facing forward. 3. Tense the muscles in your outer thigh and lift your top leg 4-6 inches (10-15 cm). 4. Hold this position for __________ seconds. 5. Slowly return to the starting position. Let your muscles relax completely before doing another repetition. Repeat __________ times. Complete this exercise __________ times a day. Exercise D: Straight leg raises ( hip extensors) 1. Lie on your abdomen on your bed or a firm surface. You can put a pillow under your hips if that is more comfortable. 2. Bend your left / right knee so your foot is straight up in the air. 3. Squeeze your buttock muscles and lift your left / right thigh off the bed. Do not let your back arch. 4. Tense this muscle as hard as you can without increasing any knee pain. 5. Hold this position for __________ seconds. 6. Slowly lower your leg to the starting position and allow it to relax completely. Repeat __________ times. Complete this exercise __________ times a day. Exercise E: Hip  hike 1. Stand sideways on a bottom step. Stand on your left / right leg with your other foot unsupported next to the step. You can hold onto the railing or wall if needed for balance. 2. Keep your knees straight and your torso square. Then, lift your left / right hip up toward the ceiling. 3. Slowly let your left / right hip lower toward the floor, past the starting position. Your foot should get closer to the floor. Do not lean or bend your knees. Repeat __________ times. Complete this exercise __________ times a day. This information is not  intended to replace advice given to you by your health care provider. Make sure you discuss any questions you have with your health care provider. Document Released: 07/21/2005 Document Revised: 03/25/2016 Document Reviewed: 06/22/2015 Elsevier Interactive Patient Education  2018 [place].   Trochanteric Bursitis Rehab Ask your health care provider which exercises are safe for you. Do exercises exactly as told by your health care provider and adjust them as directed. It is normal to feel mild stretching, pulling, tightness, or discomfort as you do these exercises, but you should stop right away if you feel sudden pain or your pain gets worse.Do not begin these exercises until told by your health care provider. Stretching exercises These exercises warm up your muscles and joints and improve the movement and flexibility of your hip. These exercises also help to relieve pain and stiffness. Exercise A: Iliotibial band stretch  1. Lie on your side with your left / right leg in the top position. 2. Bend your left / right knee and grab your ankle. 3. Slowly bring your knee back so your thigh is behind your body. 4. Slowly lower your knee toward the floor until you feel a gentle stretch on the outside of your left / right thigh. If you do not feel a stretch and your knee will not fall farther, place the heel of your other foot on top of your outer knee and pull your thigh down farther. 5. Hold this position for __________ seconds. 6. Slowly return to the starting position. Repeat __________ times. Complete this exercise __________ times a day. Strengthening exercises These exercises build strength and endurance in your hip and pelvis. Endurance is the ability to use your muscles for a long time, even after they get tired. Exercise B: Bridge ( hip extensors) 1. Lie on your back on a firm surface with your knees bent and your feet flat on the floor. 2. Tighten your buttocks muscles and lift your  buttocks off the floor until your trunk is level with your thighs. You should feel the muscles working in your buttocks and the back of your thighs. If this exercise is too easy, try doing it with your arms crossed over your chest. 3. Hold this position for __________ seconds. 4. Slowly return to the starting position. 5. Let your muscles relax completely between repetitions. Repeat __________ times. Complete this exercise __________ times a day. Exercise C: Squats ( knee extensors and  quadriceps) 1. Stand in front of a table, with your feet and knees pointing straight ahead. You may rest your hands on the table for balance but not for support. 2. Slowly bend your knees and lower your hips like you are going to sit in a chair. ? Keep your weight over your heels, not over your toes. ? Keep your lower legs upright so they are parallel with the table legs. ? Do not let your hips go lower  than your knees. ? Do not bend lower than told by your health care provider. ? If your hip pain increases, do not bend as low. 3. Hold this position for __________ seconds. 4. Slowly push with your legs to return to standing. Do not use your hands to pull yourself to standing. Repeat __________ times. Complete this exercise __________ times a day. Exercise D: Hip hike 1. Stand sideways on a bottom step. Stand on your left / right leg with your other foot unsupported next to the step. You can hold onto the railing or wall if needed for balance. 2. Keeping your knees straight and your torso square, lift your left / right hip up toward the ceiling. 3. Hold this position for __________ seconds. 4. Slowly let your left / right hip lower toward the floor, past the starting position. Your foot should get closer to the floor. Do not lean or bend your knees. Repeat __________ times. Complete this exercise __________ times a day. Exercise E: Single leg stand 1. Stand near a counter or door frame that you can hold onto  for balance as needed. It is helpful to stand in front of a mirror for this exercise so you can watch your hip. 2. Squeeze your left / right buttock muscles then lift up your other foot. Do not let your left / right hip push out to the side. 3. Hold this position for __________ seconds. Repeat __________ times. Complete this exercise __________ times a day. This information is not intended to replace advice given to you by your health care provider. Make sure you discuss any questions you have with your health care provider. Document Released: 08/28/2004 Document Revised: 03/27/2016 Document Reviewed: 07/06/2015 Elsevier Interactive Patient Education  2018 [place] Ask your health care provider which exercises are safe for you. Do exercises exactly as told by your health care provider and adjust them as directed. It is normal to feel mild stretching, pulling, tightness, or discomfort as you do these exercises, but you should stop right away if you feel sudden pain or your pain gets worse. Do not begin these exercises until told by your health care provider. Stretching and range of motion exercises These exercises warm up your muscles and joints and improve the movement and flexibility of your elbow. These exercises also help to relieve pain, numbness, and tingling. Exercise A: Wrist extensor stretch 5. Extend your left / right elbow with your fingers pointing down. 6. Gently pull the palm of your left / right hand toward you until you feel a gentle stretch on the top of your forearm. 7. To increase the stretch, push your left / right hand toward the outer edge or pinkie side of your forearm. 8. Hold this position for __________ seconds. Repeat __________ times. Complete this exercise __________ times a day. If directed by your health care provider, repeat this stretch except do it with a bent elbow this time. Exercise B: Wrist flexor stretch  6. Extend your left /  right elbow and turn your palm upward. 7. Gently pull your left / right palm and fingertips back so your wrist extends and your fingers point more toward the ground. 8. You should feel a gentle stretch on the inside of your forearm. 9. Hold this position for __________ seconds. Repeat __________ times. Complete this exercise __________ times a day. If directed by your health care provider, repeat this stretch except do it with a bent elbow this time.  Strengthening exercises These exercises build strength and endurance in your elbow. Endurance is the ability to use your muscles for a long time, even after they get tired. Exercise C: Wrist extensors  6. Sit with your left / right forearm palm-down and fully supported on a table or countertop. Your elbow should be resting below the height of your shoulder. 7. Let your left / right wrist extend over the edge of the surface. 8. Loosely hold a __________ weight or a piece of rubber exercise band or tubing in your left / right hand. Slowly curl your left / right hand up toward your forearm. If you are using band or tubing, hold the band or tubing in place with your other hand to provide resistance. 9. Hold this position for __________ seconds. 10. Slowly return to the starting position. Repeat __________ times. Complete this exercise __________ times a day. Exercise D: Radial deviators  7. Stand with a __________ weight in your left / righthand. Or, sit while holding a rubber exercise band or tubing with your other arm supported on a table or countertop. Position your hand so your thumb is on top. 8. Raise your hand upward in front of you so your thumb travels toward your forearm, or pull up on the rubber tubing. 9. Hold this position for __________ seconds. 10. Slowly return to the starting position. Repeat __________ times. Complete this exercise __________ times a day. Exercise E: Eccentric wrist extensors 4. Sit with your left / right forearm  palm-down and fully supported on a table or countertop. Your elbow should be resting below the height of your shoulder. 5. If told by your health care provider, hold a __________ weight in your hand. 6. Let your left / right wrist extend over the edge of the surface. 7. Use your other hand to lift up your left / right hand toward your forearm. Keep your forearm on the table. 8. Using only the muscles in your left / right hand, slowly lower your hand back down to the starting position. Repeat __________ times. Complete this exercise __________ times a day. This information is not intended to replace advice given to you by your health care provider. Make sure you discuss any questions you have with your health care provider. Document Released: 07/21/2005 Document Revised: 03/26/2016 Document Reviewed: 04/19/2015 Elsevier Interactive Patient Education  [employer].

## 2018-05-15 DIAGNOSIS — N390 Urinary tract infection, site not specified: Secondary | ICD-10-CM | POA: Diagnosis not present

## 2018-06-15 ENCOUNTER — Encounter: Payer: Medicare Other | Attending: Physical Medicine and Rehabilitation | Admitting: Registered Nurse

## 2018-06-15 ENCOUNTER — Encounter: Payer: Self-pay | Admitting: Registered Nurse

## 2018-06-15 VITALS — BP 137/83 | HR 80 | Ht 64.0 in | Wt 184.6 lb

## 2018-06-15 DIAGNOSIS — G8929 Other chronic pain: Secondary | ICD-10-CM | POA: Diagnosis not present

## 2018-06-15 DIAGNOSIS — Z79899 Other long term (current) drug therapy: Secondary | ICD-10-CM | POA: Diagnosis not present

## 2018-06-15 DIAGNOSIS — Z5181 Encounter for therapeutic drug level monitoring: Secondary | ICD-10-CM | POA: Diagnosis not present

## 2018-06-15 DIAGNOSIS — M7061 Trochanteric bursitis, right hip: Secondary | ICD-10-CM | POA: Diagnosis not present

## 2018-06-15 DIAGNOSIS — G894 Chronic pain syndrome: Secondary | ICD-10-CM

## 2018-06-15 DIAGNOSIS — R5382 Chronic fatigue, unspecified: Secondary | ICD-10-CM | POA: Insufficient documentation

## 2018-06-15 DIAGNOSIS — M5416 Radiculopathy, lumbar region: Secondary | ICD-10-CM | POA: Diagnosis not present

## 2018-06-15 DIAGNOSIS — M545 Low back pain, unspecified: Secondary | ICD-10-CM

## 2018-06-15 DIAGNOSIS — Z79891 Long term (current) use of opiate analgesic: Secondary | ICD-10-CM

## 2018-06-15 DIAGNOSIS — M4302 Spondylolysis, cervical region: Secondary | ICD-10-CM | POA: Diagnosis not present

## 2018-06-15 DIAGNOSIS — M797 Fibromyalgia: Secondary | ICD-10-CM | POA: Diagnosis not present

## 2018-06-15 DIAGNOSIS — I2510 Atherosclerotic heart disease of native coronary artery without angina pectoris: Secondary | ICD-10-CM

## 2018-06-15 MED ORDER — HYDROCODONE-ACETAMINOPHEN 5-325 MG PO TABS: 1.0000 | ORAL_TABLET | Freq: Four times a day (QID) | ORAL | 0 refills | Status: DC | PRN

## 2018-06-15 NOTE — Progress Notes (Signed)
Subjective:    Patient ID: Kara Bush, female    DOB: [birth year], 78 y.o.   MRN: [id number]  HPI: Kara Bush is a 77 year old female who returns for follow up appointment for chronic pain and medication refill. She states her pain is located in her mid- back mainly right side and lower back pain radiating into right hip and right lower extremity.  Also reports increase intensity of pain and  her hydrocodone only gives 4- 6 hours of relief, we will increase her tablets today and will re-evaluate next visit, she verbalizes understanding.  She rates her pain 5. Her current exercise regime is walking and water aerobics 2-3 times a week.   Kara Bush Morphine Equivalent is 15.00 MME. Last UDS was Performed on 04/13/2018, it was consistent.    Pain Inventory Average Pain 5 Pain Right Now 5 My pain is aching  In the last 24 hours, has pain interfered with the following? General activity 4 Relation with others 0 Enjoyment of life 0 What TIME of day is your pain at its worst? morning Sleep (in general) Good  Pain is worse with: walking, bending and some activites Pain improves with: rest, pacing activities and medication Relief from Meds: 5  Mobility walk without assistance do you drive?  yes  Function Do you have any goals in this area?  no  Neuro/Psych No problems in this area  Prior Studies Any changes since last visit?  no  Physicians involved in your care Any changes since last visit?  no   Family History  Problem Relation Age of Onset  . Heart disease Father   . Heart attack Father        @ 48  . Cancer Paternal Aunt        BREAST  . Heart failure Mother        at 80  . Healthy Sister   . Cancer Brother        pancreatic   . Heart failure Maternal Grandmother   . Heart attack Maternal Grandfather   . Heart attack Paternal Grandmother        at 66  . Healthy Daughter   . Healthy Daughter    Social History   Socioeconomic History  .  Marital status: Divorced    Spouse name: Not on file  . Number of children: 2  . Years of education: college   . Highest education level: Not on file  Occupational History    Employer: RETIRED  Social Needs  . Financial resource strain: Not on file  . Food insecurity:    Worry: Not on file    Inability: Not on file  . Transportation needs:    Medical: Not on file    Non-medical: Not on file  Tobacco Use  . Smoking status: Former Smoker    Packs/day: 2.50    Years: 20.00    Pack years: 50.00    Types: Cigarettes    Last attempt to quit: 06/29/1990    Years since quitting: 27.9  . Smokeless tobacco: Never Used  Substance and Sexual Activity  . Alcohol use: No  . Drug use: No  . Sexual activity: Not on file  Lifestyle  . Physical activity:    Days per week: Not on file    Minutes per session: Not on file  . Stress: Not on file  Relationships  . Social connections:    Talks on phone: Not on file  Gets together: Not on file    Attends religious service: Not on file    Active member of club or organization: Not on file    Attends meetings of clubs or organizations: Not on file    Relationship status: Not on file  Other Topics Concern  . Not on file  Social History Narrative  . Not on file   Past Surgical History:  Procedure Laterality Date  . [place]  . CHOLECYSTECTOMY  1997  . CORONARY ANGIOPLASTY WITH STENT PLACEMENT  2006   to LAD, ATRETIC [place]A AT THAT TIME ALSO  . CORONARY ANGIOPLASTY WITH STENT PLACEMENT  2007   STENT TO PROX VG-DIAG, OTHER STENTS PATENT  . CORONARY ARTERY BYPASS GRAFT  1998   LIMA-LAD;VG-DIAG & RCA  . CORONARY STENT PLACEMENT  2005   TO LAD & LCX-STENTS,  . HERNIA REPAIR    . TUBAL LIGATION     BTL   Past Medical History:  Diagnosis Date  . Abdominal aortic aneurysm ([place])   . Anemia, improved 01/11/2013  . Basal cell carcinoma   . CAD (coronary artery disease)    hx CABG 1998, last cath 2007 with PCI and  stenting to the proximal segment of the vein graft to the diagonal branch. DES Taxus was placed  . Cervicalgia   . Chest wall pain   . Chronic fatigue fibromyalgia syndrome   . Chronic pain syndrome   . Coronary artery disease   . Depression   . Fibromyalgia   . Fibromyalgia   . History of nuclear stress test 08/07/2011   lexiscan; no evidence of ischemia or infarct; low risk   . Hyperlipidemia   . Hypertension   . Hypertension   . Obstructive sleep apnea   . OSA (obstructive sleep apnea)   . PAD (peripheral artery disease) ([place])   . Pes anserine bursitis   . Pulmonary embolism ([place]) 01/11/2013  . S/P resection of aortic aneurysm   . Trochanteric bursitis   . Ulcerative colitis   . Ulcerative colitis ([facility])    BP 137/83   Pulse 80   Ht 5\' 4"  (1.626 m)   Wt 184 lb 9.6 oz (83.7 kg)   SpO2 97%   BMI 31.69 kg/m   Opioid Risk Score:   Fall Risk Score:  `1  Depression screen PHQ 2/9  Depression screen [facility] 2/9 04/13/2018 02/12/2018 12/11/2017 08/18/2017 08/21/2015 11/07/2014 05/25/2013  Decreased Interest 0 0 0 0 0 0 0  Down, Depressed, Hopeless 0 0 0 0 0 0 0  PHQ - 2 Score 0 0 0 0 0 0 0  Tired, decreased energy - - - - - 1 -  Change in appetite - - - - - 1 -  Feeling bad or failure about yourself  - - - - - 0 -  Trouble concentrating - - - - - 0 -  Moving slowly or fidgety/restless - - - - - 0 -  Suicidal thoughts - - - - - 0 -     Review of Systems  Constitutional: Negative.   HENT: Negative.   Eyes: Negative.   Respiratory: Negative.   Cardiovascular: Negative.   Gastrointestinal: Negative.   Endocrine: Negative.   Genitourinary: Negative.   Musculoskeletal: Positive for arthralgias, back pain and myalgias.  Skin: Negative.   Allergic/Immunologic: Negative.   Neurological: Negative.   Hematological: Negative.   Psychiatric/Behavioral: Negative.   All other systems reviewed and are negative.  Objective:   Physical Exam  Constitutional: She is oriented to  person, place, and time. She appears well-developed and well-nourished.  HENT:  Head: Normocephalic and atraumatic.  Neck: Normal range of motion. Neck supple.  Cardiovascular: Normal rate and regular rhythm.  Pulmonary/Chest: Effort normal and breath sounds normal.  Musculoskeletal:  Normal Muscle Bulk and Muscle Testing Reveals:  Upper Extremities: Full ROM and Muscle Strength 5/5 Bilateral AC Joint Tenderness Thoracic Paraspinal Tenderness: T-3-T-7 Lumbar Paraspinal Tenderness: L-3-L-5 Right Greater Trochanter Tenderness Lower Extremities: Full ROM and Muscle Strength 5/5 Arises from chair with ease Narrow Based Gait   Neurological: She is alert and oriented to person, place, and time.  Skin: Skin is warm and dry.  Psychiatric: She has a normal mood and affect. Her behavior is normal.  Nursing note and vitals reviewed.         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 Fibromyalgia: Continue Pool therapyand Home Exercise Program.112/2019 3. Bilateral intermittent hand numbness: Carpal tunnel syndrome versus C6 radiculopathy mild:No complaints today.Continue to Monitor.06/15/2018 4.Chronic Midline Low Back Pain/LBP: Refilled: Norco 5/325mg  # 120 pills--use one pill every 6 hours as needed for pain. A second prescription wase-scribe for the followingmonth.06/15/2018 We will continue the opioid monitoring program, this consists of regular clinic visits, examinations, urine drug screen, pill counts as well as use of [place] Controlled Substance Reporting System.  5. Muscle Spasm: Continuecurrent medication regimen withRobaxin.02/12/2018 6.RightGreater Trochanteric Bursitis: Continue to Alternate with heat and ice therapy. Continue current medication regime. 06/15/2018.  20 minutes of face to face patient care time was spent  during this visit. All questions were encouraged and answered.  F/U in 2 months

## 2018-06-21 ENCOUNTER — Other Ambulatory Visit: Payer: Self-pay | Admitting: Internal Medicine

## 2018-07-22 DIAGNOSIS — K6289 Other specified diseases of anus and rectum: Secondary | ICD-10-CM | POA: Diagnosis not present

## 2018-07-22 DIAGNOSIS — G479 Sleep disorder, unspecified: Secondary | ICD-10-CM | POA: Diagnosis not present

## 2018-07-22 DIAGNOSIS — B349 Viral infection, unspecified: Secondary | ICD-10-CM | POA: Diagnosis not present

## 2018-08-10 ENCOUNTER — Encounter: Payer: Medicare Other | Attending: Physical Medicine and Rehabilitation | Admitting: Registered Nurse

## 2018-08-10 ENCOUNTER — Encounter: Payer: Self-pay | Admitting: Registered Nurse

## 2018-08-10 VITALS — BP 147/76 | HR 85 | Resp 14 | Ht 64.0 in | Wt 182.0 lb

## 2018-08-10 DIAGNOSIS — M797 Fibromyalgia: Secondary | ICD-10-CM | POA: Insufficient documentation

## 2018-08-10 DIAGNOSIS — Z5181 Encounter for therapeutic drug level monitoring: Secondary | ICD-10-CM | POA: Diagnosis not present

## 2018-08-10 DIAGNOSIS — M545 Low back pain: Secondary | ICD-10-CM | POA: Diagnosis not present

## 2018-08-10 DIAGNOSIS — M7061 Trochanteric bursitis, right hip: Secondary | ICD-10-CM

## 2018-08-10 DIAGNOSIS — G894 Chronic pain syndrome: Secondary | ICD-10-CM | POA: Diagnosis not present

## 2018-08-10 DIAGNOSIS — Z79899 Other long term (current) drug therapy: Secondary | ICD-10-CM | POA: Insufficient documentation

## 2018-08-10 DIAGNOSIS — Z79891 Long term (current) use of opiate analgesic: Secondary | ICD-10-CM

## 2018-08-10 DIAGNOSIS — M4302 Spondylolysis, cervical region: Secondary | ICD-10-CM | POA: Diagnosis not present

## 2018-08-10 DIAGNOSIS — G8929 Other chronic pain: Secondary | ICD-10-CM | POA: Diagnosis not present

## 2018-08-10 DIAGNOSIS — R5382 Chronic fatigue, unspecified: Secondary | ICD-10-CM | POA: Insufficient documentation

## 2018-08-10 MED ORDER — HYDROCODONE-ACETAMINOPHEN 5-325 MG PO TABS: 1.0000 | ORAL_TABLET | Freq: Four times a day (QID) | ORAL | 0 refills | Status: DC | PRN

## 2018-08-10 NOTE — Progress Notes (Signed)
Subjective:    Patient ID: Kara Bush, female    DOB: 12/26/1940, 78 y.o.   MRN: [id number]  HPI: Kara Bush is a 78 y.o. female who returns for follow up appointment for chronic pain and medication refill. She states her pain is located in her right hip and lower back pain with activity. She rates her pain 4. Her current exercise regime is  Attending the [facility] three days a week for water therapy,walking and performing stretching exercises.  Kara Bush Morphine equivalent is 20.00MME.  Last UDS was performed on 04/13/2018, it was consistent. Oral swab performed today.   Pain Inventory Average Pain 4 Pain Right Now 4 My pain is aching  In the last 24 hours, has pain interfered with the following? General activity 3 Relation with others 0 Enjoyment of life 2 What TIME of day is your pain at its worst? morning Sleep (in general) Good  Pain is worse with: walking Pain improves with: medication Relief from Meds: 7  Mobility walk without assistance Do you have any goals in this area?  no  Function retired Do you have any goals in this area?  no  Neuro/Psych trouble walking  Prior Studies Any changes since last visit?  no  Physicians involved in your care Any changes since last visit?  no   Family History  Problem Relation Age of Onset  . Heart disease Father   . Heart attack Father        @ 55  . Cancer Paternal Aunt        BREAST  . Heart failure Mother        at 58  . Healthy Sister   . Cancer Brother        pancreatic   . Heart failure Maternal Grandmother   . Heart attack Maternal Grandfather   . Heart attack Paternal Grandmother        at 81  . Healthy Daughter   . Healthy Daughter    Social History   Socioeconomic History  . Marital status: Divorced    Spouse name: Not on file  . Number of children: 2  . Years of education: college   . Highest education level: Not on file  Occupational History    Employer: RETIRED  Social  Needs  . Financial resource strain: Not on file  . Food insecurity:    Worry: Not on file    Inability: Not on file  . Transportation needs:    Medical: Not on file    Non-medical: Not on file  Tobacco Use  . Smoking status: Former Smoker    Packs/day: 2.50    Years: 20.00    Pack years: 50.00    Types: Cigarettes    Last attempt to quit: 06/29/1990    Years since quitting: 28.1  . Smokeless tobacco: Never Used  Substance and Sexual Activity  . Alcohol use: No  . Drug use: No  . Sexual activity: Not on file  Lifestyle  . Physical activity:    Days per week: Not on file    Minutes per session: Not on file  . Stress: Not on file  Relationships  . Social connections:    Talks on phone: Not on file    Gets together: Not on file    Attends religious service: Not on file    Active member of club or organization: Not on file    Attends meetings of clubs or organizations: Not on file  Relationship status: Not on file  Other Topics Concern  . Not on file  Social History Narrative  . Not on file   Past Surgical History:  Procedure Laterality Date  . [place]  . CHOLECYSTECTOMY  1997  . CORONARY ANGIOPLASTY WITH STENT PLACEMENT  2006   to LAD, ATRETIC LIMA AT THAT TIME ALSO  . CORONARY ANGIOPLASTY WITH STENT PLACEMENT  2007   STENT TO PROX VG-DIAG, OTHER STENTS PATENT  . CORONARY ARTERY BYPASS GRAFT  1998   LIMA-LAD;VG-DIAG & RCA  . CORONARY STENT PLACEMENT  2005   TO LAD & LCX-STENTS,  . HERNIA REPAIR    . TUBAL LIGATION     BTL   Past Medical History:  Diagnosis Date  . Abdominal aortic aneurysm ([place])   . Anemia, improved 01/11/2013  . Basal cell carcinoma   . CAD (coronary artery disease)    hx CABG 1998, last cath 2007 with PCI and stenting to the proximal segment of the vein graft to the diagonal branch. DES Taxus was placed  . Cervicalgia   . Chest wall pain   . Chronic fatigue fibromyalgia syndrome   . Chronic pain syndrome   .  Coronary artery disease   . Depression   . Fibromyalgia   . Fibromyalgia   . History of nuclear stress test 08/07/2011   lexiscan; no evidence of ischemia or infarct; low risk   . Hyperlipidemia   . Hypertension   . Hypertension   . Obstructive sleep apnea   . OSA (obstructive sleep apnea)   . PAD (peripheral artery disease) ([place])   . Pes anserine bursitis   . Pulmonary embolism ([place]) 01/11/2013  . S/P resection of aortic aneurysm   . Trochanteric bursitis   . Ulcerative colitis   . Ulcerative colitis ([facility])    BP (!) 147/76   Pulse 85   Resp 14   Ht 5\' 4"  (1.626 m)   Wt 182 lb (82.6 kg)   SpO2 93%   BMI 31.24 kg/m   Opioid Risk Score:   Fall Risk Score:  `1  Depression screen PHQ 2/9  Depression screen [facility] 2/9 04/13/2018 02/12/2018 12/11/2017 08/18/2017 08/21/2015 11/07/2014 05/25/2013  Decreased Interest 0 0 0 0 0 0 0  Down, Depressed, Hopeless 0 0 0 0 0 0 0  PHQ - 2 Score 0 0 0 0 0 0 0  Tired, decreased energy - - - - - 1 -  Change in appetite - - - - - 1 -  Feeling bad or failure about yourself  - - - - - 0 -  Trouble concentrating - - - - - 0 -  Moving slowly or fidgety/restless - - - - - 0 -  Suicidal thoughts - - - - - 0 -    Review of Systems  Constitutional: Negative.   HENT: Negative.   Eyes: Negative.   Respiratory: Negative.   Cardiovascular: Negative.   Gastrointestinal: Negative.   Endocrine: Negative.   Genitourinary: Negative.   Musculoskeletal: Positive for arthralgias and gait problem.  Skin: Negative.   Allergic/Immunologic: Negative.   Hematological: Negative.   Psychiatric/Behavioral: Negative.   All other systems reviewed and are negative.      Objective:   Physical Exam Vitals signs and nursing note reviewed.  Constitutional:      Appearance: Normal appearance.  Neck:     Musculoskeletal: Normal range of motion and neck supple.  Cardiovascular:     Rate and Rhythm:  Normal rate and regular rhythm.     Pulses: Normal pulses.     Heart  sounds: Normal heart sounds.  Pulmonary:     Effort: Pulmonary effort is normal.     Breath sounds: Normal breath sounds.  Musculoskeletal:     Comments: Normal Muscle Bulk and Muscle Testing Reveals:  Upper Extremities:Full  ROM and Muscle Strength 5/5 Back without spinal tenderness noted  Right greater Trochanter Tenderness Lower Extremities: Full ROM and Muscle Strength 5/5 Arises from Table with ease Narrow based Gait   Skin:    General: Skin is warm and dry.  Neurological:     Mental Status: She is alert and oriented to person, place, and time.  Psychiatric:        Mood and Affect: Mood normal.        Behavior: Behavior normal.           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 Fibromyalgia: Continue Pool therapyand Home Exercise Program.08/10/2018 3. Bilateral intermittent hand numbness: Carpal tunnel syndrome versus C6 radiculopathy mild:No complaints today.Continue to Monitor.08/10/2018 4.Chronic Midline Low Back Pain/LBP: Refilled: Norco 5/325mg  # 120 pills--use one pill every 6 hours as needed for pain. A second prescription wase-scribefor the followingmonth.08/10/2018. We will continue the opioid monitoring program, this consists of regular clinic visits, examinations, urine drug screen, pill counts as well as use of [place] Controlled Substance Reporting System.  5. Muscle Spasm: Continuecurrent medication regimen withRobaxin.08/10/2018 6.RightGreater Trochanteric Bursitis: Continue to Alternate with heat and ice therapy. Continue current medication regime. 08/10/2018  20 minutes of face to face patient care time was spent during this visit. All questions were encouraged and answered.  F/U in 2 months

## 2018-08-14 LAB — DRUG TOX MONITOR 1 W/CONF, ORAL FLD
Amphetamines: NEGATIVE ng/mL (ref <10)
Barbiturates: NEGATIVE ng/mL (ref <10)
Benzodiazepines: NEGATIVE ng/mL (ref <0.50)
Buprenorphine: NEGATIVE ng/mL (ref <0.10)
CODEINE: NEGATIVE ng/mL (ref <2.5)
Cocaine: NEGATIVE ng/mL (ref <5.0)
DIHYDROCODEINE: 14.2 ng/mL — AB (ref <2.5)
Fentanyl: NEGATIVE ng/mL (ref <0.10)
Heroin Metabolite: NEGATIVE ng/mL (ref <1.0)
Hydrocodone: 119.8 ng/mL — ABNORMAL HIGH (ref <2.5)
Hydromorphone: NEGATIVE ng/mL (ref <2.5)
MARIJUANA: NEGATIVE ng/mL (ref <2.5)
MDMA: NEGATIVE ng/mL (ref <10)
Meprobamate: NEGATIVE ng/mL (ref <2.5)
Methadone: NEGATIVE ng/mL (ref <5.0)
Morphine: NEGATIVE ng/mL (ref <2.5)
Nicotine Metabolite: NEGATIVE ng/mL (ref <5.0)
Norhydrocodone: 5.3 ng/mL — ABNORMAL HIGH (ref <2.5)
Noroxycodone: NEGATIVE ng/mL (ref <2.5)
Opiates: POSITIVE ng/mL — AB (ref <2.5)
Oxycodone: NEGATIVE ng/mL (ref <2.5)
Oxymorphone: NEGATIVE ng/mL (ref <2.5)
PHENCYCLIDINE: NEGATIVE ng/mL (ref <10)
TRAMADOL: NEGATIVE ng/mL (ref <5.0)
Tapentadol: NEGATIVE ng/mL (ref <5.0)
ZOLPIDEM: NEGATIVE ng/mL (ref <5.0)

## 2018-08-14 LAB — DRUG TOX ALC METAB W/CON, ORAL FLD: Alcohol Metabolite: NEGATIVE ng/mL (ref <25)

## 2018-08-17 ENCOUNTER — Encounter: Payer: Self-pay | Admitting: Internal Medicine

## 2018-08-17 ENCOUNTER — Ambulatory Visit (INDEPENDENT_AMBULATORY_CARE_PROVIDER_SITE_OTHER): Payer: Medicare Other | Admitting: Internal Medicine

## 2018-08-17 ENCOUNTER — Telehealth: Payer: Self-pay | Admitting: *Deleted

## 2018-08-17 VITALS — BP 126/62 | HR 82 | Ht 64.0 in | Wt 180.0 lb

## 2018-08-17 DIAGNOSIS — Z01812 Encounter for preprocedural laboratory examination: Secondary | ICD-10-CM | POA: Diagnosis not present

## 2018-08-17 DIAGNOSIS — I739 Peripheral vascular disease, unspecified: Secondary | ICD-10-CM

## 2018-08-17 DIAGNOSIS — Z951 Presence of aortocoronary bypass graft: Secondary | ICD-10-CM | POA: Diagnosis not present

## 2018-08-17 DIAGNOSIS — I7121 Aneurysm of the ascending aorta, without rupture: Secondary | ICD-10-CM

## 2018-08-17 DIAGNOSIS — I712 Thoracic aortic aneurysm, without rupture: Secondary | ICD-10-CM

## 2018-08-17 DIAGNOSIS — E782 Mixed hyperlipidemia: Secondary | ICD-10-CM

## 2018-08-17 NOTE — Telephone Encounter (Signed)
Oral swab drug screen was consistent for prescribed medications.  ?

## 2018-08-17 NOTE — Patient Instructions (Signed)
Medication Instructions:  Continue current medications If you need a refill on your cardiac medications before your next appointment, please call your pharmacy.   Testing/Procedures: Please schedule your CT angiogram and vascular studies (aorta scan, leg artery doppler)  Follow-Up: At [facility], you and your health needs are our priority.  As part of our continuing mission to provide you with exceptional heart care, we have created designated Provider Care Teams.  These Care Teams include your primary Cardiologist (physician) and Advanced Practice Providers (APPs -  Physician Assistants and Nurse Practitioners) who all work together to provide you with the care you need, when you need it. You will need a follow up appointment in 12 months.  Please call our office 2 months in advance to schedule this appointment.  You may see Pixie Casino, MD or one of the following Advanced Practice Providers on your designated Care Team: [place], [place] . Fabian Sharp, PA-C  Any Other Special Instructions Will Be Listed Below (If Applicable).

## 2018-08-17 NOTE — Progress Notes (Signed)
08/17/2018   PCP: Leighton Ruff, MD  CC: No complaints  HPI: 78 year old white female with a history of coronary artery disease as well as peripheral vascular disease. She had bypass grafting in 1998 and stents in 2005 2006 and 2000 710 negative nuclear stress test in 2013 she also has peripheral arterial disease and history of aortic iliac bypass in 1991. Last ABIs were made of 2013 were normal.  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  Fortunately she was asymptomatic, she was placed on anticoagulation with the Xarelto.   She has done well since that time.  Her last visit with Dr. Debara Pickett was in February and he was planning to recheck d-dimer and if it was still elevated she would need to continue her Xarelto for total of 6 months.  Her d-dimer continues to be elevated at 0.67. Initial d-dimer in February was 1.48.  Venous Dopplers were also done in February which were negative for DVT.    Kara Bush just had a repeat D. dimer which was elevated at 1.48, actually slightly higher than it had been before. She tells a story that over the past several weeks she's had a urinary tract infection and started taking Bactrim for which she is more nauseated and is actually thrown up. She reports some tenderness across her epigastric area as well as the costovertebral angles. She's also had fever, chills and some sweating at night. She is planning on seeing her primary care doctor back in the office this afternoon. She denies any worsening shortness of breath or chest pain. He's had no bleeding complications on Xarelto.  Kara Bush returns today for follow-up. She recently reports some shortness of breath with exertion. She occasionally gets twinges in her chest however it is not reminiscent of her prior angina. Nevertheless her last stress test was 2 years ago. I think a lot of her  shortness of breath is due to weight gain and minimal exercise. She does water exercise but not water aerobics.  I saw Kara Bush today in follow-up of her stress test and lower extremity Dopplers. Unfortunately she did not get the lower extremity Dopplers. She did undergo a metabolic stress testing. This showed no circulatory limitation to exercise. The main factors in her shortness of breath seems to be obesity and some pulmonary restriction. Currently she denies any significant leg claudication.  07/03/2106  Kara Bush returns today for follow-up. Overall she's feeling fairly well. She had lower chimney arterial Dopplers a year ago which showed normal ABIs. She gets some occasional pain, burning and itching in her extremities which may be related to fibromyalgia. She denies any worsening chest pain or shortness of breath with exertion.  08/10/2017  Kara Bush was seen today in follow-up.  She says she is fairly stable with regards to her symptoms compared to last year.  She gets short of breath, when doing moderate to heavy exertion.  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  She had slight worsening of her right lower extremity ABI last year but denies any worsening claudication with exertion.  Blood pressures been well controlled at home.  She has a physical with routine blood work in 3 months.  08/17/2018  Kara Bush returns today for routine follow-up.  Is been a year since I last  saw her.  She had an echocardiogram which showed a stable dilated aortic root.  We had planned to repeat CT angiography of the aorta around this time.  She also had had lower extremity Dopplers but denies any worsening claudication.  This showed mild bilateral lower extremity arterial disease.  She reports continued exercise and does do water aerobics.  She struggles with fibromyalgia and is on long-term opiate therapy.  Labs from March 2019 showed  total cholesterol 141, HDL 50, LDL 64 and triglycerides 134.    Allergies  Allergen Reactions  . Atenolol Other (See Comments)    cholitis  . Bactrim [Sulfamethoxazole-Trimethoprim] Nausea And Vomiting  . Cymbalta [Duloxetine Hcl] Other (See Comments)    Mad pt feel spacey  . Lyrica [Pregabalin] Other (See Comments)    Made pt feel spacey  . Penicillins Rash    Current Outpatient Medications  Medication Sig Dispense Refill  . aspirin 81 MG tablet Take 81 mg by mouth daily.     Marland Kitchen b complex vitamins capsule Take 1 capsule by mouth daily.    . citalopram (CELEXA) 20 MG tablet Take 20 mg by mouth daily.     Marland Kitchen CRANBERRY PO Take by mouth daily.    . diclofenac sodium (VOLTAREN) 1 % GEL Apply topically 4 (four) times daily.    Marland Kitchen ezetimibe (ZETIA) 10 MG tablet Take 1 tablet (10 mg total) by mouth daily. 90 tablet 3  . HYDROcodone-acetaminophen (NORCO/VICODIN) 5-325 MG tablet Take 1 tablet by mouth 4 (four) times daily as needed for moderate pain. 120 tablet 0  . lisinopril (PRINIVIL,ZESTRIL) 5 MG tablet TAKE 1 TABLET BY MOUTH EVERY DAY 90 tablet 1  . methocarbamol (ROBAXIN) 500 MG tablet Take 1 tablet (500 mg total) by mouth every 8 (eight) hours as needed for muscle spasms. 30 tablet 4  . metoprolol succinate (TOPROL-XL) 25 MG 24 hr tablet Take 0.5 tablets (12.5 mg total) by mouth daily. 45 tablet 3  . Multiple Vitamin (MULITIVITAMIN WITH MINERALS) TABS Take 1 tablet by mouth daily.      . naproxen sodium (ANAPROX) 220 MG tablet Take 220 mg by mouth 2 (two) times daily with a meal. For pain.    Marland Kitchen OVER THE COUNTER MEDICATION daily.    . simvastatin (ZOCOR) 40 MG tablet TAKE 1 TABLET BY MOUTH EVERY DAY AT 6PM 90 tablet 3  . zolpidem (AMBIEN) 5 MG tablet TAKE 1 TABLET BY MOUTH 1-2 AT BEDTIME AS NEEDED **MUST LAST 30 DAYS**  2   No current facility-administered medications for this visit.     Past Medical History:  Diagnosis Date  . Abdominal aortic aneurysm ([place])   . Anemia, improved  01/11/2013  . Basal cell carcinoma   . CAD (coronary artery disease)    hx CABG 1998, last cath 2007 with PCI and stenting to the proximal segment of the vein graft to the diagonal branch. DES Taxus was placed  . Cervicalgia   . Chest wall pain   . Chronic fatigue fibromyalgia syndrome   . Chronic pain syndrome   . Coronary artery disease   . Depression   . Fibromyalgia   . Fibromyalgia   . History of nuclear stress test 08/07/2011   lexiscan; no evidence of ischemia or infarct; low risk   . Hyperlipidemia   . Hypertension   . Hypertension   . Obstructive sleep apnea   . OSA (obstructive sleep apnea)   . PAD (peripheral artery disease) ([place])   . Pes anserine  bursitis   . Pulmonary embolism ([place]) 01/11/2013  . S/P resection of aortic aneurysm   . Trochanteric bursitis   . Ulcerative colitis   . Ulcerative colitis [facility])     Past Surgical History:  Procedure Laterality Date  . [place]  . CHOLECYSTECTOMY  1997  . CORONARY ANGIOPLASTY WITH STENT PLACEMENT  2006   to LAD, ATRETIC LIMA AT THAT TIME ALSO  . CORONARY ANGIOPLASTY WITH STENT PLACEMENT  2007   STENT TO PROX VG-DIAG, OTHER STENTS PATENT  . CORONARY ARTERY BYPASS GRAFT  1998   LIMA-LAD;VG-DIAG & RCA  . CORONARY STENT PLACEMENT  2005   TO LAD & LCX-STENTS,  . HERNIA REPAIR    . TUBAL LIGATION     BTL   EKG: Sinus rhythm first-degree AV block at 82-personally reviewed  ROS: Pertinent items noted in HPI and remainder of comprehensive ROS otherwise negative.  PHYSICAL EXAM BP 126/62 (BP Location: Left Arm, Patient Position: Sitting, Cuff Size: Normal)   Pulse 82   Ht 5\' 4"  (1.626 m)   Wt 180 lb (81.6 kg)   BMI 30.90 kg/m  General appearance: alert, no distress and moderately obese Neck: no carotid bruit and no JVD Lungs: clear to auscultation bilaterally Heart: regular rate and rhythm and systolic murmur: early systolic 2/6, blowing at 2nd right intercostal space Abdomen: soft,  non-tender; bowel sounds normal; no masses,  no organomegaly Extremities: extremities normal, atraumatic, no cyanosis or edema Pulses: 2+ and symmetric Skin: Skin color, texture, turgor normal. No rashes or lesions Neurologic: Grossly normal Psych: Pleasant  ASSESSMENT AND PLAN Patient Active Problem List   Diagnosis Date Noted  . DOE (dyspnea on exertion) 08/10/2017  . 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 Trochanteric bursitis 11/07/2014  . Sacro-iliac pain 11/07/2014  . Low back pain 11/07/2013  . Hepatitis 04/20/2013  . Screening for STD (sexually transmitted disease) 04/20/2013  . Recurrent UTI 04/20/2013  . Abdominal aortic aneurysm ([place])   . Ulcerative colitis ([place])   . Obstructive sleep apnea   . Fibromyalgia   . Coronary artery disease   . Basal cell carcinoma   . Anemia, improved 01/11/2013  . Ascending aortic aneurysm, 4.1 cm 01/11/2013  . 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 Chronic periscapular pain 10/15/2011  . Chest wall pain   . S/P resection of aortic aneurysm, 1998 prior to CABG   . PAD (peripheral artery disease), aortic-iliac bypass 1991, mild carotid bruits   . OSA (obstructive sleep apnea)   . Chronic fatigue fibromyalgia syndrome   . Hyperlipidemia   . Ulcerative colitis   . Depression   . CAD (coronary artery disease), CABG 1998, STENTS MULTIPLE SITES SINCE.    Marland Kitchen Hypertension    PLAN: Kara Bush continues to do well and is asymptomatic.  She denies worsening chest pain or shortness of breath.  She is able to exercise without any restrictions.  She does have some mild PAD and will have repeat Dopplers coming up.  In addition she is scheduled for CT scan to reassess her dilated a sending aorta.  With regards to her prior bypass 1998 she denies any anginal symptoms.  We will continue to monitor this and treat her aggressively for risk factor modification.  Plan follow-up annually or sooner as necessary.  Pixie Casino, MD, [facility],  [place] Director of the [facility] Lipid Disorders &  Cardiovascular Risk Reduction Clinic Diplomate of the American Board  of Clinical Lipidology Attending Cardiologist  Direct Dial: [phone]  Fax: [phone]  Website:  [email or web address]

## 2018-08-18 ENCOUNTER — Other Ambulatory Visit: Payer: Self-pay

## 2018-08-18 DIAGNOSIS — E875 Hyperkalemia: Secondary | ICD-10-CM

## 2018-08-18 LAB — BASIC METABOLIC PANEL
BUN / CREAT RATIO: 25 (ref 12–28)
BUN: 24 mg/dL (ref 8–27)
CO2: 26 mmol/L (ref 20–29)
Calcium: 10.3 mg/dL (ref 8.7–10.3)
Chloride: 102 mmol/L (ref 96–106)
Creatinine, Ser: 0.97 mg/dL (ref 0.57–1.00)
GFR calc Af Amer: 64 mL/min/{1.73_m2} (ref >59)
GFR calc non Af Amer: 56 mL/min/{1.73_m2} — ABNORMAL LOW (ref >59)
GLUCOSE: 83 mg/dL (ref 65–99)
Potassium: 5.3 mmol/L — ABNORMAL HIGH (ref 3.5–5.2)
SODIUM: 143 mmol/L (ref 134–144)

## 2018-08-18 NOTE — Progress Notes (Signed)
Repeat Bmet needed in 2 weeks. Patient is aware.

## 2018-08-31 ENCOUNTER — Ambulatory Visit (INDEPENDENT_AMBULATORY_CARE_PROVIDER_SITE_OTHER)
Admission: RE | Admit: 2018-08-31 | Discharge: 2018-08-31 | Disposition: A | Discharge status: Home / Self Care | Payer: Medicare Other | Source: Ambulatory Visit | Attending: Internal Medicine | Admitting: Internal Medicine

## 2018-08-31 DIAGNOSIS — I712 Thoracic aortic aneurysm, without rupture: Secondary | ICD-10-CM

## 2018-08-31 DIAGNOSIS — I7121 Aneurysm of the ascending aorta, without rupture: Secondary | ICD-10-CM

## 2018-08-31 MED ORDER — IOPAMIDOL (ISOVUE-370) INJECTION 76%
100.0000 mL | Freq: Once | INTRAVENOUS | Status: AC | PRN
  Administered 2018-08-31: 100 mL via INTRAVENOUS

## 2018-09-01 DIAGNOSIS — E875 Hyperkalemia: Secondary | ICD-10-CM | POA: Diagnosis not present

## 2018-09-01 LAB — BASIC METABOLIC PANEL
BUN/Creatinine Ratio: 19 (ref 12–28)
BUN: 18 mg/dL (ref 8–27)
CO2: 25 mmol/L (ref 20–29)
Calcium: 10.1 mg/dL (ref 8.7–10.3)
Chloride: 100 mmol/L (ref 96–106)
Creatinine, Ser: 0.96 mg/dL (ref 0.57–1.00)
GFR calc non Af Amer: 56 mL/min/{1.73_m2} — ABNORMAL LOW (ref >59)
GFR, EST AFRICAN AMERICAN: 65 mL/min/{1.73_m2} (ref >59)
Glucose: 98 mg/dL (ref 65–99)
Potassium: 4.8 mmol/L (ref 3.5–5.2)
Sodium: 141 mmol/L (ref 134–144)

## 2018-09-02 ENCOUNTER — Other Ambulatory Visit: Payer: Self-pay | Admitting: *Deleted

## 2018-09-02 DIAGNOSIS — I712 Thoracic aortic aneurysm, without rupture, unspecified: Secondary | ICD-10-CM

## 2018-09-02 DIAGNOSIS — I7121 Aneurysm of the ascending aorta, without rupture: Secondary | ICD-10-CM

## 2018-09-09 ENCOUNTER — Ambulatory Visit (INDEPENDENT_AMBULATORY_CARE_PROVIDER_SITE_OTHER): Payer: Medicare Other | Admitting: Internal Medicine

## 2018-09-09 ENCOUNTER — Encounter: Payer: Self-pay | Admitting: Internal Medicine

## 2018-09-09 ENCOUNTER — Ambulatory Visit (HOSPITAL_COMMUNITY)
Admission: RE | Admit: 2018-09-09 | Discharge: 2018-09-09 | Disposition: A | Discharge status: Home / Self Care | Payer: Medicare Other | Source: Ambulatory Visit | Attending: Internal Medicine | Admitting: Internal Medicine

## 2018-09-09 ENCOUNTER — Ambulatory Visit (HOSPITAL_BASED_OUTPATIENT_CLINIC_OR_DEPARTMENT_OTHER)
Admission: RE | Admit: 2018-09-09 | Discharge: 2018-09-09 | Disposition: A | Discharge status: Home / Self Care | Payer: Medicare Other | Source: Ambulatory Visit | Attending: Internal Medicine | Admitting: Internal Medicine

## 2018-09-09 VITALS — BP 159/78 | Ht 64.0 in | Wt 179.8 lb

## 2018-09-09 DIAGNOSIS — R6889 Other general symptoms and signs: Secondary | ICD-10-CM | POA: Insufficient documentation

## 2018-09-09 DIAGNOSIS — I714 Abdominal aortic aneurysm, without rupture, unspecified: Secondary | ICD-10-CM

## 2018-09-09 DIAGNOSIS — I4891 Unspecified atrial fibrillation: Secondary | ICD-10-CM | POA: Insufficient documentation

## 2018-09-09 DIAGNOSIS — I739 Peripheral vascular disease, unspecified: Secondary | ICD-10-CM

## 2018-09-09 DIAGNOSIS — E782 Mixed hyperlipidemia: Secondary | ICD-10-CM | POA: Diagnosis not present

## 2018-09-09 MED ORDER — RIVAROXABAN 20 MG PO TABS: 20.0000 mg | ORAL_TABLET | Freq: Every day | ORAL | 6 refills | Status: DC

## 2018-09-09 NOTE — Progress Notes (Signed)
09/09/2018   PCP: Leighton Ruff, MD  CC: Add on for new A. fib  HPI: 78 year old white female with a history of coronary artery disease as well as peripheral vascular disease. She had bypass grafting in 1998 and stents in 2005 2006 and 2000 710 negative nuclear stress test in 2013 she also has peripheral arterial disease and history of aortic iliac bypass in 1991. Last ABIs were made of 2013 were normal.  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  Fortunately she was asymptomatic, she was placed on anticoagulation with the Xarelto.   She has done well since that time.  Her last visit with Dr. Debara Pickett was in February and he was planning to recheck d-dimer and if it was still elevated she would need to continue her Xarelto for total of 6 months.  Her d-dimer continues to be elevated at 0.67. Initial d-dimer in February was 1.48.  Venous Dopplers were also done in February which were negative for DVT.    Mrs. Garrido just had a repeat D. dimer which was elevated at 1.48, actually slightly higher than it had been before. She tells a story that over the past several weeks she's had a urinary tract infection and started taking Bactrim for which she is more nauseated and is actually thrown up. She reports some tenderness across her epigastric area as well as the costovertebral angles. She's also had fever, chills and some sweating at night. She is planning on seeing her primary care doctor back in the office this afternoon. She denies any worsening shortness of breath or chest pain. He's had no bleeding complications on Xarelto.  Pearlene returns today for follow-up. She recently reports some shortness of breath with exertion. She occasionally gets twinges in her chest however it is not reminiscent of her prior angina. Nevertheless her last stress test was 2 years ago. I think a lot of her  shortness of breath is due to weight gain and minimal exercise. She does water exercise but not water aerobics.  I saw Stephie Waidelich today in follow-up of her stress test and lower extremity Dopplers. Unfortunately she did not get the lower extremity Dopplers. She did undergo a metabolic stress testing. This showed no circulatory limitation to exercise. The main factors in her shortness of breath seems to be obesity and some pulmonary restriction. Currently she denies any significant leg claudication.  07/03/2106  Mrs. Nygard returns today for follow-up. Overall she's feeling fairly well. She had lower chimney arterial Dopplers a year ago which showed normal ABIs. She gets some occasional pain, burning and itching in her extremities which may be related to fibromyalgia. She denies any worsening chest pain or shortness of breath with exertion.  08/10/2017  Mrs. Farkas was seen today in follow-up.  She says she is fairly stable with regards to her symptoms compared to last year.  She gets short of breath, when doing moderate to heavy exertion.  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  She had slight worsening of her right lower extremity ABI last year but denies any worsening claudication with exertion.  Blood pressures been well controlled at home.  She has a physical with routine blood work in 3 months.  08/17/2018  Mrs. Altizer returns today for routine follow-up.  Is been a  year since I last saw her.  She had an echocardiogram which showed a stable dilated aortic root.  We had planned to repeat CT angiography of the aorta around this time.  She also had had lower extremity Dopplers but denies any worsening claudication.  This showed mild bilateral lower extremity arterial disease.  She reports continued exercise and does do water aerobics.  She struggles with fibromyalgia and is on long-term opiate therapy.  Labs from March 2019 showed  total cholesterol 141, HDL 50, LDL 64 and triglycerides 134.  09/09/2018  Mrs. Charbonnet is seen today as an add-on for new A. fib.  I just saw her a couple weeks ago.  At the time she was in sinus rhythm and doing well.  Today she was in for Dopplers and was noted to be in an irregular rhythm.  EKG was performed which shows newly identified atrial fibrillation with controlled ventricular response at 79.  Subsequently she reported that she has been having some episodes of palpitations on and off however it was not clear whether this could have been related to her fibromyalgia or any other disorder.  She is also had a few episodes of presyncope.    Allergies  Allergen Reactions  . Atenolol Other (See Comments)    cholitis  . Bactrim [Sulfamethoxazole-Trimethoprim] Nausea And Vomiting  . Cymbalta [Duloxetine Hcl] Other (See Comments)    Mad pt feel spacey  . Lyrica [Pregabalin] Other (See Comments)    Made pt feel spacey  . Penicillins Rash    Current Outpatient Medications  Medication Sig Dispense Refill  . b complex vitamins capsule Take 1 capsule by mouth daily.    . citalopram (CELEXA) 20 MG tablet Take 20 mg by mouth daily.     Marland Kitchen CRANBERRY PO Take by mouth daily.    . diclofenac sodium (VOLTAREN) 1 % GEL Apply topically 4 (four) times daily.    Marland Kitchen ezetimibe (ZETIA) 10 MG tablet Take 1 tablet (10 mg total) by mouth daily. 90 tablet 3  . HYDROcodone-acetaminophen (NORCO/VICODIN) 5-325 MG tablet Take 1 tablet by mouth 4 (four) times daily as needed for moderate pain. 120 tablet 0  . lisinopril (PRINIVIL,ZESTRIL) 5 MG tablet TAKE 1 TABLET BY MOUTH EVERY DAY 90 tablet 1  . methocarbamol (ROBAXIN) 500 MG tablet Take 1 tablet (500 mg total) by mouth every 8 (eight) hours as needed for muscle spasms. 30 tablet 4  . metoprolol succinate (TOPROL-XL) 25 MG 24 hr tablet Take 0.5 tablets (12.5 mg total) by mouth daily. 45 tablet 3  . Multiple Vitamin (MULITIVITAMIN WITH MINERALS) TABS Take 1 tablet by  mouth daily.      Marland Kitchen OVER THE COUNTER MEDICATION daily.    . simvastatin (ZOCOR) 40 MG tablet TAKE 1 TABLET BY MOUTH EVERY DAY AT 6PM 90 tablet 3  . zolpidem (AMBIEN) 5 MG tablet TAKE 1 TABLET BY MOUTH 1-2 AT BEDTIME AS NEEDED **MUST LAST 30 DAYS**  2  . rivaroxaban (XARELTO) 20 MG TABS tablet Take 1 tablet (20 mg total) by mouth daily with supper. 30 tablet 6   No current facility-administered medications for this visit.     Past Medical History:  Diagnosis Date  . Abdominal aortic aneurysm ([place])   . Anemia, improved 01/11/2013  . Basal cell carcinoma   . CAD (coronary artery disease)    hx CABG 1998, last cath 2007 with PCI and stenting to the proximal segment of the vein graft to the diagonal branch. DES Taxus was  placed  . Cervicalgia   . Chest wall pain   . Chronic fatigue fibromyalgia syndrome   . Chronic pain syndrome   . Coronary artery disease   . Depression   . Fibromyalgia   . Fibromyalgia   . History of nuclear stress test 08/07/2011   lexiscan; no evidence of ischemia or infarct; low risk   . Hyperlipidemia   . Hypertension   . Hypertension   . Obstructive sleep apnea   . OSA (obstructive sleep apnea)   . PAD (peripheral artery disease) ([place])   . Pes anserine bursitis   . Pulmonary embolism ([place]) 01/11/2013  . S/P resection of aortic aneurysm   . Trochanteric bursitis   . Ulcerative colitis   . Ulcerative colitis [facility])     Past Surgical History:  Procedure Laterality Date  . [place]  . CHOLECYSTECTOMY  1997  . CORONARY ANGIOPLASTY WITH STENT PLACEMENT  2006   to LAD, ATRETIC LIMA AT THAT TIME ALSO  . CORONARY ANGIOPLASTY WITH STENT PLACEMENT  2007   STENT TO PROX VG-DIAG, OTHER STENTS PATENT  . CORONARY ARTERY BYPASS GRAFT  1998   LIMA-LAD;VG-DIAG & RCA  . CORONARY STENT PLACEMENT  2005   TO LAD & LCX-STENTS,  . HERNIA REPAIR    . TUBAL LIGATION     BTL   EKG: A. fib at 79-personally reviewed  ROS: Pertinent items noted in  HPI and remainder of comprehensive ROS otherwise negative.  PHYSICAL EXAM BP (!) 159/78   Ht 5\' 4"  (1.626 m)   Wt 179 lb 12.8 oz (81.6 kg)   BMI 30.86 kg/m  General appearance: alert, no distress and moderately obese Neck: no carotid bruit and no JVD Lungs: clear to auscultation bilaterally Heart: irregularly irregular rhythm and systolic murmur: early systolic 2/6, blowing at 2nd right intercostal space Abdomen: soft, non-tender; bowel sounds normal; no masses,  no organomegaly Extremities: extremities normal, atraumatic, no cyanosis or edema Pulses: 2+ and symmetric Skin: Skin color, texture, turgor normal. No rashes or lesions Neurologic: Grossly normal Psych: Pleasant  ASSESSMENT AND PLAN Patient Active Problem List   Diagnosis Date Noted  . DOE (dyspnea on exertion) 08/10/2017  . 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 Trochanteric bursitis 11/07/2014  . Sacro-iliac pain 11/07/2014  . Low back pain 11/07/2013  . Hepatitis 04/20/2013  . Screening for STD (sexually transmitted disease) 04/20/2013  . Recurrent UTI 04/20/2013  . Abdominal aortic aneurysm ([place])   . Ulcerative colitis ([place])   . Obstructive sleep apnea   . Fibromyalgia   . Coronary artery disease   . Basal cell carcinoma   . Anemia, improved 01/11/2013  . Ascending aortic aneurysm, 4.1 cm 01/11/2013  . 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 Chronic periscapular pain 10/15/2011  . Chest wall pain   . S/P resection of aortic aneurysm, 1998 prior to CABG   . PAD (peripheral artery disease), aortic-iliac bypass 1991, mild carotid bruits   . OSA (obstructive sleep apnea)   . Chronic fatigue fibromyalgia syndrome   . Hyperlipidemia   . Ulcerative colitis   . Depression   . CAD (coronary artery disease), CABG 1998, STENTS MULTIPLE SITES SINCE.    Marland Kitchen Hypertension    PLAN: Mrs. Robak is found to have new onset atrial fibrillation.  I did recommend anticoagulation for her as her CHADSVASC score is  6. She is agreeable to this therapy as well.  We will plan starting Xarelto 20 mg daily since she  took Xarelto previously and tolerated it well.  She can also stop her aspirin.  We will also arrange for 2-week monitor to see if she is in persistent atrial fibrillation or if it is paroxysmal.  If she persists I would consider a cardioversion attempt after 3 to 4 weeks of anticoagulation.  Follow-up 1 month.  Pixie Casino, MD, [facility] [place] Director of the Advanced Lipid Disorders &  Cardiovascular Risk Reduction Clinic Diplomate of the American Board of Clinical Lipidology Attending Cardiologist  Direct Dial: [phone]  Fax: [phone]  Website:  [email or web address]

## 2018-09-09 NOTE — Patient Instructions (Signed)
Medication Instructions:  STOP- Aspirin START- Xarelto 20 mg daily  If you need a refill on your cardiac medications before your next appointment, please call your pharmacy.  Labwork: None Ordered   Take the provided lab slips with you to the lab for your blood draw.   When you have your labs (blood work) drawn today and your tests are completely normal, you will receive your results only by MyChart Message (if you have MyChart) -OR-  A paper copy in the mail.  If you have any lab test that is abnormal or we need to change your treatment, we will call you to review these results.  Testing/Procedures: Your physician has recommended that you wear a Zio Patch monitor for 14 days. Event monitors are medical devices that record the heart's electrical activity. Doctors most often [place] these monitors to diagnose arrhythmias. Arrhythmias are problems with the speed or rhythm of the heartbeat. The monitor is a small, portable device. You can wear one while you do your normal daily activities. This is usually used to diagnose what is causing palpitations/syncope (passing out).   Follow-Up: . Your physician recommends that you schedule a follow-up appointment in: 1 Month   At [facility], you and your health needs are our priority.  As part of our continuing mission to provide you with exceptional heart care, we have created designated Provider Care Teams.  These Care Teams include your primary Cardiologist (physician) and Advanced Practice Providers (APPs -  Physician Assistants and Nurse Practitioners) who all work together to provide you with the care you need, when you need it.  Thank you for choosing [facility] at [facility]!!

## 2018-09-13 ENCOUNTER — Telehealth: Payer: Self-pay

## 2018-09-13 DIAGNOSIS — Z9889 Other specified postprocedural states: Principal | ICD-10-CM

## 2018-09-13 DIAGNOSIS — I712 Thoracic aortic aneurysm, without rupture: Secondary | ICD-10-CM

## 2018-09-13 DIAGNOSIS — Z8679 Personal history of other diseases of the circulatory system: Secondary | ICD-10-CM

## 2018-09-13 DIAGNOSIS — I771 Stricture of artery: Secondary | ICD-10-CM

## 2018-09-13 DIAGNOSIS — I7121 Aneurysm of the ascending aorta, without rupture: Secondary | ICD-10-CM

## 2018-09-13 NOTE — Telephone Encounter (Signed)
Spoke with pt. Pt made aware of results and Dr.Hilty's recommendation. Ref placed for VVS. Adv pt that VVs will call her directly to schedule. She f/u as planned for her cardiac monitor and Dr.Hilty. Pt verbalizes understanding.

## 2018-09-13 NOTE — Telephone Encounter (Signed)
-----   Message from Pixie Casino, MD sent at 09/11/2018 11:01 AM EST ----- >50% stenosis in vascular grafts - not clear that she is symptomatic, but has not seen vascular in "years". Please refer back to be established with vascular surgery.  Dr. Lemmie Evens

## 2018-09-13 NOTE — Telephone Encounter (Addendum)
Called to give pt results and Dr.Hilty's recommendation. Lmtcb.   Notes recorded by Pixie Casino, MD on 09/11/2018 at 10:52 AM EST Mild bilateral LE arterial disease.  Dr. Lemmie Evens

## 2018-09-16 ENCOUNTER — Ambulatory Visit (INDEPENDENT_AMBULATORY_CARE_PROVIDER_SITE_OTHER): Payer: Medicare Other

## 2018-09-16 DIAGNOSIS — H02889 Meibomian gland dysfunction of unspecified eye, unspecified eyelid: Secondary | ICD-10-CM | POA: Diagnosis not present

## 2018-09-16 DIAGNOSIS — I4891 Unspecified atrial fibrillation: Secondary | ICD-10-CM

## 2018-10-05 ENCOUNTER — Encounter: Payer: Medicare Other | Attending: Physical Medicine and Rehabilitation | Admitting: Physical Medicine & Rehabilitation

## 2018-10-05 ENCOUNTER — Encounter: Payer: Self-pay | Admitting: Physical Medicine & Rehabilitation

## 2018-10-05 VITALS — BP 140/82 | HR 88 | Ht 64.0 in | Wt 179.0 lb

## 2018-10-05 DIAGNOSIS — M545 Low back pain: Secondary | ICD-10-CM | POA: Diagnosis not present

## 2018-10-05 DIAGNOSIS — G8929 Other chronic pain: Secondary | ICD-10-CM

## 2018-10-05 DIAGNOSIS — M7061 Trochanteric bursitis, right hip: Secondary | ICD-10-CM | POA: Diagnosis not present

## 2018-10-05 DIAGNOSIS — M797 Fibromyalgia: Secondary | ICD-10-CM | POA: Insufficient documentation

## 2018-10-05 DIAGNOSIS — Z79899 Other long term (current) drug therapy: Secondary | ICD-10-CM | POA: Diagnosis not present

## 2018-10-05 DIAGNOSIS — M4302 Spondylolysis, cervical region: Secondary | ICD-10-CM | POA: Insufficient documentation

## 2018-10-05 DIAGNOSIS — Z5181 Encounter for therapeutic drug level monitoring: Secondary | ICD-10-CM | POA: Insufficient documentation

## 2018-10-05 DIAGNOSIS — R5382 Chronic fatigue, unspecified: Secondary | ICD-10-CM | POA: Insufficient documentation

## 2018-10-05 MED ORDER — HYDROCODONE-ACETAMINOPHEN 5-325 MG PO TABS: 1.0000 | ORAL_TABLET | Freq: Four times a day (QID) | ORAL | 0 refills | Status: DC | PRN

## 2018-10-05 NOTE — Patient Instructions (Signed)
PLEASE FEEL FREE TO CALL OUR OFFICE WITH ANY PROBLEMS OR QUESTIONS ([phone])      

## 2018-10-05 NOTE — Progress Notes (Signed)
Subjective:    Patient ID: Kara Bush, female    DOB: 1941-01-01, 78 y.o.   MRN: [id number]  HPI   Kara Bush is here in follow up of her chronic pain. She has been maintaining fairly well. She has constant mid to upper back pain which she copes with in addition to her hips and legs.   She was diagnosed with a fib last year and is now on xarelto.  She is using less oral NSAIDs because of the xarelto. She has voltaren gel but is not using it regularly---really had forgotten.   She has maintained her water exercises and other activities as she has in the past.   Pain Inventory Average Pain 4 Pain Right Now 3 My pain is .  In the last 24 hours, has pain interfered with the following? General activity 1 Relation with others 0 Enjoyment of life 1 What TIME of day is your pain at its worst? daytime Sleep (in general) Fair  Pain is worse with: some activites Pain improves with: rest and medication Relief from Meds: 8  Mobility walk without assistance ability to climb steps?  yes do you drive?  yes  Function Do you have any goals in this area?  no  Neuro/Psych No problems in this area  Prior Studies Any changes since last visit?  no  Physicians involved in your care Any changes since last visit?  no   Family History  Problem Relation Age of Onset  . Heart disease Father   . Heart attack Father        @ 36  . Cancer Paternal Aunt        BREAST  . Heart failure Mother        at 38  . Healthy Sister   . Cancer Brother        pancreatic   . Heart failure Maternal Grandmother   . Heart attack Maternal Grandfather   . Heart attack Paternal Grandmother        at 56  . Healthy Daughter   . Healthy Daughter    Social History   Socioeconomic History  . Marital status: Divorced    Spouse name: Not on file  . Number of children: 2  . Years of education: college   . Highest education level: Not on file  Occupational History    Employer: RETIRED    Social Needs  . Financial resource strain: Not on file  . Food insecurity:    Worry: Not on file    Inability: Not on file  . Transportation needs:    Medical: Not on file    Non-medical: Not on file  Tobacco Use  . Smoking status: Former Smoker    Packs/day: 2.50    Years: 20.00    Pack years: 50.00    Types: Cigarettes    Last attempt to quit: 06/29/1990    Years since quitting: 28.2  . Smokeless tobacco: Never Used  Substance and Sexual Activity  . Alcohol use: No  . Drug use: No  . Sexual activity: Not on file  Lifestyle  . Physical activity:    Days per week: Not on file    Minutes per session: Not on file  . Stress: Not on file  Relationships  . Social connections:    Talks on phone: Not on file    Gets together: Not on file    Attends religious service: Not on file    Active member of club  or organization: Not on file    Attends meetings of clubs or organizations: Not on file    Relationship status: Not on file  Other Topics Concern  . Not on file  Social History Narrative  . Not on file   Past Surgical History:  Procedure Laterality Date  . [place]  . CHOLECYSTECTOMY  1997  . CORONARY ANGIOPLASTY WITH STENT PLACEMENT  2006   to LAD, ATRETIC LIMA AT THAT TIME ALSO  . CORONARY ANGIOPLASTY WITH STENT PLACEMENT  2007   STENT TO PROX VG-DIAG, OTHER STENTS PATENT  . CORONARY ARTERY BYPASS GRAFT  1998   LIMA-LAD;VG-DIAG & RCA  . CORONARY STENT PLACEMENT  2005   TO LAD & LCX-STENTS,  . HERNIA REPAIR    . TUBAL LIGATION     BTL   Past Medical History:  Diagnosis Date  . Abdominal aortic aneurysm ([place])   . Anemia, improved 01/11/2013  . Basal cell carcinoma   . CAD (coronary artery disease)    hx CABG 1998, last cath 2007 with PCI and stenting to the proximal segment of the vein graft to the diagonal branch. DES Taxus was placed  . Cervicalgia   . Chest wall pain   . Chronic fatigue fibromyalgia syndrome   . Chronic pain  syndrome   . Coronary artery disease   . Depression   . Fibromyalgia   . Fibromyalgia   . History of nuclear stress test 08/07/2011   lexiscan; no evidence of ischemia or infarct; low risk   . Hyperlipidemia   . Hypertension   . Hypertension   . Obstructive sleep apnea   . OSA (obstructive sleep apnea)   . PAD (peripheral artery disease) ([place])   . Pes anserine bursitis   . Pulmonary embolism ([place]) 01/11/2013  . S/P resection of aortic aneurysm   . Trochanteric bursitis   . Ulcerative colitis   . Ulcerative colitis ([facility])    BP 140/82   Pulse 88   Ht 5\' 4"  (1.626 m)   Wt 179 lb (81.2 kg)   SpO2 96%   BMI 30.73 kg/m   Opioid Risk Score:   Fall Risk Score:  `1  Depression screen PHQ 2/9  Depression screen [facility] 2/9 04/13/2018 02/12/2018 12/11/2017 08/18/2017 08/21/2015 11/07/2014 05/25/2013  Decreased Interest 0 0 0 0 0 0 0  Down, Depressed, Hopeless 0 0 0 0 0 0 0  PHQ - 2 Score 0 0 0 0 0 0 0  Tired, decreased energy - - - - - 1 -  Change in appetite - - - - - 1 -  Feeling bad or failure about yourself  - - - - - 0 -  Trouble concentrating - - - - - 0 -  Moving slowly or fidgety/restless - - - - - 0 -  Suicidal thoughts - - - - - 0 -     Review of Systems  Constitutional: Negative.   HENT: Negative.   Eyes: Negative.   Respiratory: Negative.   Cardiovascular: Negative.   Gastrointestinal: Negative.   Endocrine: Negative.   Genitourinary: Negative.   Musculoskeletal: Positive for arthralgias and myalgias.  Skin: Negative.   Allergic/Immunologic: Negative.   Neurological: Negative.   Hematological: Negative.   Psychiatric/Behavioral: Negative.   All other systems reviewed and are negative.      Objective:   Physical Exam General: No acute distress HEENT: EOMI, oral membranes moist Cards: reg rate  Chest: normal effort Abdomen: Soft, NT, ND  Skin: dry, intact Extremities: no edema Musculoskeletal: head forward position. Rhomboids tight. Tender with scapular  ROM Neurological: She is alert and oriented to person, place, and time.  Skin: Skin is warm and dry.  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 [place]. -refilled hydrocodone 5/325 q6 prn #120 -Medication was refilled and a second prescription was sent to the patient's pharmacy for next month.     2. Fibromyalgia:  -fairly well maintainedat present. -continue with aquatic program and general exercise.  3. Newly diagnosed atrial fib. 4. LBP/lumbar spondylosis and perhaps SIJ and troch bursitis  .   5. Follow up withNPin about 2 months.72minutes of face to face patient care time were spent during this visit. All questions were encouraged and answered.

## 2018-10-06 DIAGNOSIS — I4891 Unspecified atrial fibrillation: Secondary | ICD-10-CM | POA: Diagnosis not present

## 2018-10-07 ENCOUNTER — Other Ambulatory Visit: Payer: Self-pay | Admitting: Internal Medicine

## 2018-10-07 DIAGNOSIS — Z1231 Encounter for screening mammogram for malignant neoplasm of breast: Secondary | ICD-10-CM | POA: Diagnosis not present

## 2018-10-08 ENCOUNTER — Other Ambulatory Visit: Payer: Self-pay

## 2018-10-08 ENCOUNTER — Ambulatory Visit (INDEPENDENT_AMBULATORY_CARE_PROVIDER_SITE_OTHER): Payer: Medicare Other | Admitting: Vascular Surgery

## 2018-10-08 ENCOUNTER — Encounter: Payer: Self-pay | Admitting: Vascular Surgery

## 2018-10-08 VITALS — BP 142/76 | HR 88 | Temp 97.1°F | Resp 20 | Ht 64.0 in | Wt 176.7 lb

## 2018-10-08 DIAGNOSIS — I739 Peripheral vascular disease, unspecified: Secondary | ICD-10-CM

## 2018-10-08 NOTE — Progress Notes (Signed)
Patient ID: Kara Bush, female   DOB: [birth year], 78 y.o.   MRN: [id number]  Reason for Consult: New Patient (Initial Visit)   Referred by Leighton Ruff, MD  Subjective:     HPI:  Kara Bush is a 78 y.o. female with remote history of aortobifemoral bypass 30 years ago.  At that time she had significant claudication is very short distance.  This times her feet feel very  Past Medical History:  Diagnosis Date  . Abdominal aortic aneurysm ([place])   . Anemia, improved 01/11/2013  . Basal cell carcinoma   . CAD (coronary artery disease)    hx CABG 1998, last cath 2007 with PCI and stenting to the proximal segment of the vein graft to the diagonal branch. DES Taxus was placed  . Cervicalgia   . Chest wall pain   . Chronic fatigue fibromyalgia syndrome   . Chronic pain syndrome   . Coronary artery disease   . Depression   . Fibromyalgia   . Fibromyalgia   . History of nuclear stress test 08/07/2011   lexiscan; no evidence of ischemia or infarct; low risk   . Hyperlipidemia   . Hypertension   . Hypertension   . Obstructive sleep apnea   . OSA (obstructive sleep apnea)   . PAD (peripheral artery disease) ([place])   . Pes anserine bursitis   . Pulmonary embolism ([place]) 01/11/2013  . S/P resection of aortic aneurysm   . Trochanteric bursitis   . Ulcerative colitis   . Ulcerative colitis ([place])    Family History  Problem Relation Age of Onset  . Heart disease Father   . Heart attack Father        @ 44  . Cancer Paternal Aunt        BREAST  . Heart failure Mother        at 76  . Healthy Sister   . Cancer Brother        pancreatic   . Heart failure Maternal Grandmother   . Heart attack Maternal Grandfather   . Heart attack Paternal Grandmother        at 17  . Healthy Daughter   . Healthy Daughter    Past Surgical History:  Procedure Laterality Date  . [place]  . CHOLECYSTECTOMY  1997  . CORONARY ANGIOPLASTY WITH STENT PLACEMENT   2006   to LAD, ATRETIC LIMA AT THAT TIME ALSO  . CORONARY ANGIOPLASTY WITH STENT PLACEMENT  2007   STENT TO PROX VG-DIAG, OTHER STENTS PATENT  . CORONARY ARTERY BYPASS GRAFT  1998   LIMA-LAD;VG-DIAG & RCA  . CORONARY STENT PLACEMENT  2005   TO LAD & LCX-STENTS,  . HERNIA REPAIR    . TUBAL LIGATION     BTL    Short Social History:  Social History   Tobacco Use  . Smoking status: Former Smoker    Packs/day: 2.50    Years: 20.00    Pack years: 50.00    Types: Cigarettes    Last attempt to quit: 06/29/1990    Years since quitting: 28.2  . Smokeless tobacco: Never Used  Substance Use Topics  . Alcohol use: No    Allergies  Allergen Reactions  . Atenolol Other (See Comments)    cholitis  . Bactrim [Sulfamethoxazole-Trimethoprim] Nausea And Vomiting  . Cymbalta [Duloxetine Hcl] Other (See Comments)    Mad pt feel spacey  . Lyrica [Pregabalin] Other (See Comments)    Made  pt feel spacey  . Penicillins Rash    Current Outpatient Medications  Medication Sig Dispense Refill  . b complex vitamins capsule Take 1 capsule by mouth daily.    . citalopram (CELEXA) 20 MG tablet Take 20 mg by mouth daily.     Marland Kitchen CRANBERRY PO Take by mouth daily.    . diclofenac sodium (VOLTAREN) 1 % GEL Apply topically 4 (four) times daily.    Marland Kitchen ezetimibe (ZETIA) 10 MG tablet Take 1 tablet (10 mg total) by mouth daily. 90 tablet 3  . fluorometholone (FML) 0.1 % ophthalmic suspension 1 drop every 4 (four) hours.    Marland Kitchen HYDROcodone-acetaminophen (NORCO/VICODIN) 5-325 MG tablet Take 1 tablet by mouth 4 (four) times daily as needed for moderate pain. 120 tablet 0  . lisinopril (PRINIVIL,ZESTRIL) 5 MG tablet TAKE 1 TABLET BY MOUTH EVERY DAY 90 tablet 1  . methocarbamol (ROBAXIN) 500 MG tablet Take 1 tablet (500 mg total) by mouth every 8 (eight) hours as needed for muscle spasms. 30 tablet 4  . metoprolol succinate (TOPROL-XL) 25 MG 24 hr tablet TAKE 1/2 TABLET BY MOUTH DAILY. 45 tablet 10  . Multiple  Vitamin (MULITIVITAMIN WITH MINERALS) TABS Take 1 tablet by mouth daily.      Marland Kitchen OVER THE COUNTER MEDICATION daily.    . rivaroxaban (XARELTO) 20 MG TABS tablet Take 1 tablet (20 mg total) by mouth daily with supper. 30 tablet 6  . simvastatin (ZOCOR) 40 MG tablet TAKE 1 TABLET BY MOUTH EVERY DAY AT 6PM 90 tablet 3  . tobramycin (TOBREX) 0.3 % ophthalmic solution every 4 (four) hours.    Marland Kitchen zolpidem (AMBIEN) 5 MG tablet TAKE 1 TABLET BY MOUTH 1-2 AT BEDTIME AS NEEDED **MUST LAST 30 DAYS**  2   No current facility-administered medications for this visit.     Review of Systems  Constitutional:  Constitutional negative. HENT: HENT negative.  Eyes: Eyes negative.  Respiratory: Respiratory negative.  Cardiovascular: Cardiovascular negative.  GI: Gastrointestinal negative.  Musculoskeletal: Positive for leg pain.  Skin: Skin negative.  Neurological: Neurological negative. Hematologic: Hematologic/lymphatic negative.  Psychiatric: Psychiatric negative.        Objective:  Objective   Vitals:   10/08/18 1027  BP: (!) 142/76  Pulse: 88  Resp: 20  Temp: (!) 97.1 F (36.2 C)  SpO2: 97%  Weight: 176 lb 11.2 oz (80.2 kg)  Height: 5\' 4"  (1.626 m)   Body mass index is 30.33 kg/m.  Physical Exam HENT:     Head: Normocephalic.     Nose: Nose normal.  Eyes:     Pupils: Pupils are equal, round, and reactive to light.  Neck:     Musculoskeletal: Normal range of motion and neck supple.  Cardiovascular:     Rate and Rhythm: Normal rate.     Pulses:          Popliteal pulses are 2+ on the right side and 2+ on the left side.       Dorsalis pedis pulses are 2+ on the right side and 2+ on the left side.  Pulmonary:     Effort: Pulmonary effort is normal.  Abdominal:     General: Abdomen is flat.     Palpations: Abdomen is soft. There is mass.  Musculoskeletal: Normal range of motion.        General: No swelling.  Skin:    Capillary Refill: Capillary refill takes less than 2 seconds.   Neurological:     Mental Status: She  is alert.  Psychiatric:        Mood and Affect: Mood normal.        Behavior: Behavior normal.        Thought Content: Thought content normal.        Judgment: Judgment normal.     Data: ABI right 0.88 left 0.92  I reviewed her aortoiliac duplex which demonstrated aorta 2 cm greatest diameter.  She has by and triphasic waveforms in her right and left external iliac arteries respectively.  There is an elevated velocity on the left 291 and on the right to 240 cm/s suggesting 50% stenosis in the proximal and distal segments of the graft.  The graft is otherwise patent.     Assessment/Plan:     78 year old female follows up for evaluation previous aorto biiliac bypass with elevated velocities distally.  She does have palpable pedal pulses and ABIs are preserved.  Given that this graft is 80 years old I do not think I would intervene specifically because she has no symptoms.  Certainly I would be concerned we would make her worse or cause an issue with this graft that is likely well incorporated.  I can see her back on an as-needed basis.     Waynetta Sandy MD [facility]

## 2018-10-14 ENCOUNTER — Other Ambulatory Visit: Payer: Self-pay | Admitting: Internal Medicine

## 2018-10-27 ENCOUNTER — Telehealth (INDEPENDENT_AMBULATORY_CARE_PROVIDER_SITE_OTHER): Payer: Medicare Other | Admitting: Internal Medicine

## 2018-10-27 DIAGNOSIS — I771 Stricture of artery: Secondary | ICD-10-CM

## 2018-10-27 DIAGNOSIS — I480 Paroxysmal atrial fibrillation: Secondary | ICD-10-CM | POA: Diagnosis not present

## 2018-10-27 DIAGNOSIS — I4891 Unspecified atrial fibrillation: Secondary | ICD-10-CM

## 2018-10-27 DIAGNOSIS — I739 Peripheral vascular disease, unspecified: Secondary | ICD-10-CM

## 2018-10-27 NOTE — Progress Notes (Addendum)
This encounter was created in error - please disregard.

## 2018-10-29 ENCOUNTER — Other Ambulatory Visit: Payer: Self-pay | Admitting: Internal Medicine

## 2018-11-01 ENCOUNTER — Ambulatory Visit: Payer: Medicare Other | Admitting: Internal Medicine

## 2018-11-04 DIAGNOSIS — F339 Major depressive disorder, recurrent, unspecified: Secondary | ICD-10-CM | POA: Diagnosis not present

## 2018-11-04 DIAGNOSIS — F419 Anxiety disorder, unspecified: Secondary | ICD-10-CM | POA: Diagnosis not present

## 2018-11-04 DIAGNOSIS — G4733 Obstructive sleep apnea (adult) (pediatric): Secondary | ICD-10-CM | POA: Diagnosis not present

## 2018-11-04 DIAGNOSIS — Z0000 Encounter for general adult medical examination without abnormal findings: Secondary | ICD-10-CM | POA: Diagnosis not present

## 2018-11-04 DIAGNOSIS — I2510 Atherosclerotic heart disease of native coronary artery without angina pectoris: Secondary | ICD-10-CM | POA: Diagnosis not present

## 2018-11-04 DIAGNOSIS — E7800 Pure hypercholesterolemia, unspecified: Secondary | ICD-10-CM | POA: Diagnosis not present

## 2018-11-04 DIAGNOSIS — M797 Fibromyalgia: Secondary | ICD-10-CM | POA: Diagnosis not present

## 2018-11-04 DIAGNOSIS — I10 Essential (primary) hypertension: Secondary | ICD-10-CM | POA: Diagnosis not present

## 2018-11-04 DIAGNOSIS — R7303 Prediabetes: Secondary | ICD-10-CM | POA: Diagnosis not present

## 2018-11-04 DIAGNOSIS — M1990 Unspecified osteoarthritis, unspecified site: Secondary | ICD-10-CM | POA: Diagnosis not present

## 2018-11-04 DIAGNOSIS — Z7901 Long term (current) use of anticoagulants: Secondary | ICD-10-CM | POA: Diagnosis not present

## 2018-11-11 ENCOUNTER — Ambulatory Visit: Payer: PRIVATE HEALTH INSURANCE | Admitting: Physician Assistant

## 2018-12-07 ENCOUNTER — Encounter: Payer: Medicare Other | Attending: Physical Medicine and Rehabilitation | Admitting: Registered Nurse

## 2018-12-07 ENCOUNTER — Encounter: Payer: Self-pay | Admitting: Registered Nurse

## 2018-12-07 ENCOUNTER — Other Ambulatory Visit: Payer: Self-pay

## 2018-12-07 VITALS — BP 140/80 | HR 88 | Ht 64.0 in | Wt 178.0 lb

## 2018-12-07 DIAGNOSIS — R5382 Chronic fatigue, unspecified: Secondary | ICD-10-CM | POA: Insufficient documentation

## 2018-12-07 DIAGNOSIS — M797 Fibromyalgia: Secondary | ICD-10-CM | POA: Insufficient documentation

## 2018-12-07 DIAGNOSIS — Z79891 Long term (current) use of opiate analgesic: Secondary | ICD-10-CM | POA: Diagnosis not present

## 2018-12-07 DIAGNOSIS — Z5181 Encounter for therapeutic drug level monitoring: Secondary | ICD-10-CM | POA: Insufficient documentation

## 2018-12-07 DIAGNOSIS — M4302 Spondylolysis, cervical region: Secondary | ICD-10-CM | POA: Insufficient documentation

## 2018-12-07 DIAGNOSIS — M545 Low back pain, unspecified: Secondary | ICD-10-CM

## 2018-12-07 DIAGNOSIS — G8929 Other chronic pain: Secondary | ICD-10-CM

## 2018-12-07 DIAGNOSIS — Z79899 Other long term (current) drug therapy: Secondary | ICD-10-CM | POA: Insufficient documentation

## 2018-12-07 DIAGNOSIS — G894 Chronic pain syndrome: Secondary | ICD-10-CM

## 2018-12-07 MED ORDER — HYDROCODONE-ACETAMINOPHEN 5-325 MG PO TABS: 1.0000 | ORAL_TABLET | Freq: Four times a day (QID) | ORAL | 0 refills | Status: DC | PRN

## 2018-12-07 NOTE — Progress Notes (Signed)
Subjective:    Patient ID: Kara Bush, female    DOB: [birth year], 78 y.o.   MRN: [id number]  HPI: Kara Bush is a 78 y.o. female her appointmet was changed, due to national recommendations of social distancing due to [place] 19, an audio/video telehealth visit is felt to be most appropriate for this patient at this time.  See Chart message from today for the patient's consent to telehealth from [place].     She states her pain is located in her mid- back mainly right side. She  rates her pain 6. Her current exercise regime is walking and performing stretching exercises.  Ms. Remus Morphine equivalent is 20.00  MME.  Last Oral Swab was performed on 08/10/2018, it was consistent.   [name] CMA asked the Health and History Questions. This provider and Mancel Parsons verified we  speaking with the correct person using two identifiers.    Pain Inventory Average Pain 6 Pain Right Now 6 My pain is constant and aching  In the last 24 hours, has pain interfered with the following? General activity 3 Relation with others 0 Enjoyment of life 3 What TIME of day is your pain at its worst? morning Sleep (in general) Good  Pain is worse with: walking and bending Pain improves with: medication Relief from Meds: 6  Mobility walk without assistance how many minutes can you walk? 2 ability to climb steps?  yes do you drive?  yes  Function retired  Neuro/Psych depression anxiety  Prior Studies Any changes since last visit?  no  Physicians involved in your care Any changes since last visit?  no   Family History  Problem Relation Age of Onset  . Heart disease Father   . Heart attack Father        @ 17  . Cancer Paternal Aunt        BREAST  . Heart failure Mother        at 62  . Healthy Sister   . Cancer Brother        pancreatic   . Heart failure Maternal Grandmother   . Heart attack Maternal Grandfather   .  Heart attack Paternal Grandmother        at 42  . Healthy Daughter   . Healthy Daughter    Social History   Socioeconomic History  . Marital status: Divorced    Spouse name: Not on file  . Number of children: 2  . Years of education: college   . Highest education level: Not on file  Occupational History    Employer: RETIRED  Social Needs  . Financial resource strain: Not on file  . Food insecurity:    Worry: Not on file    Inability: Not on file  . Transportation needs:    Medical: Not on file    Non-medical: Not on file  Tobacco Use  . Smoking status: Former Smoker    Packs/day: 2.50    Years: 20.00    Pack years: 50.00    Types: Cigarettes    Last attempt to quit: 06/29/1990    Years since quitting: 28.4  . Smokeless tobacco: Never Used  Substance and Sexual Activity  . Alcohol use: No  . Drug use: No  . Sexual activity: Not on file  Lifestyle  . Physical activity:    Days per week: Not on file    Minutes per session: Not on file  . Stress: Not  on file  Relationships  . Social connections:    Talks on phone: Not on file    Gets together: Not on file    Attends religious service: Not on file    Active member of club or organization: Not on file    Attends meetings of clubs or organizations: Not on file    Relationship status: Not on file  Other Topics Concern  . Not on file  Social History Narrative  . Not on file   Past Surgical History:  Procedure Laterality Date  . [place]  . CHOLECYSTECTOMY  1997  . CORONARY ANGIOPLASTY WITH STENT PLACEMENT  2006   to LAD, ATRETIC LIMA AT THAT TIME ALSO  . CORONARY ANGIOPLASTY WITH STENT PLACEMENT  2007   STENT TO PROX VG-DIAG, OTHER STENTS PATENT  . CORONARY ARTERY BYPASS GRAFT  1998   LIMA-LAD;VG-DIAG & RCA  . CORONARY STENT PLACEMENT  2005   TO LAD & LCX-STENTS,  . HERNIA REPAIR    . TUBAL LIGATION     BTL   Past Medical History:  Diagnosis Date  . Abdominal aortic aneurysm  ([place])   . Anemia, improved 01/11/2013  . Basal cell carcinoma   . CAD (coronary artery disease)    hx CABG 1998, last cath 2007 with PCI and stenting to the proximal segment of the vein graft to the diagonal branch. DES Taxus was placed  . Cervicalgia   . Chest wall pain   . Chronic fatigue fibromyalgia syndrome   . Chronic pain syndrome   . Coronary artery disease   . Depression   . Fibromyalgia   . Fibromyalgia   . History of nuclear stress test 08/07/2011   lexiscan; no evidence of ischemia or infarct; low risk   . Hyperlipidemia   . Hypertension   . Hypertension   . Obstructive sleep apnea   . OSA (obstructive sleep apnea)   . PAD (peripheral artery disease) ([place])   . Pes anserine bursitis   . Pulmonary embolism ([place]) 01/11/2013  . S/P resection of aortic aneurysm   . Trochanteric bursitis   . Ulcerative colitis   . Ulcerative colitis ([facility])    BP 140/80   Pulse 88   Wt 178 lb (80.7 kg)   BMI 30.55 kg/m   Opioid Risk Score:   Fall Risk Score:  `1  Depression screen PHQ 2/9  Depression screen [facility] 2/9 04/13/2018 02/12/2018 12/11/2017 08/18/2017 08/21/2015 11/07/2014 05/25/2013  Decreased Interest 0 0 0 0 0 0 0  Down, Depressed, Hopeless 0 0 0 0 0 0 0  PHQ - 2 Score 0 0 0 0 0 0 0  Tired, decreased energy - - - - - 1 -  Change in appetite - - - - - 1 -  Feeling bad or failure about yourself  - - - - - 0 -  Trouble concentrating - - - - - 0 -  Moving slowly or fidgety/restless - - - - - 0 -  Suicidal thoughts - - - - - 0 -    Review of Systems  Constitutional: Negative.   HENT: Negative.   Eyes: Negative.   Respiratory: Negative.   Cardiovascular: Negative.   Gastrointestinal: Negative.   Endocrine: Negative.   Genitourinary: Negative.   Musculoskeletal: Positive for arthralgias.  Skin: Negative.   Allergic/Immunologic: Negative.   Neurological: Positive for headaches.  Psychiatric/Behavioral: Positive for dysphoric mood. The patient is nervous/anxious.   All other  systems reviewed and  are negative.      Objective:   Physical Exam Vitals signs and nursing note reviewed.  Musculoskeletal:     Comments: No Physical Exam Perform: Virtual Visit  Neurological:     Mental Status: She is oriented to person, place, and time.           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 Fibromyalgia: Continue Pool therapyand Home Exercise Program.08/10/2018 3. Bilateral intermittent hand numbness: Carpal tunnel syndrome versus C6 radiculopathy mild:No complaints today.Continue to Monitor.08/10/2018 4.Chronic Midline Low Back Pain/LBP: Refilled: Norco 5/325mg  #120pills--use one pill every 6 hours as neededfor pain. A second prescription wase-scribefor the followingmonth.08/10/2018. We will continue the opioid monitoring program, this consists of regular clinic visits, examinations, urine drug screen, pill counts as well as use of [place] Controlled Substance Reporting System.  5. Muscle Spasm: Continuecurrent medication regimen withRobaxin.08/10/2018 6.RightGreater Trochanteric Bursitis: Continue to Alternate with heat and ice therapy. Continue current medication regime. 08/10/2018  F/U in 2 months  Telephone Call Location of patient:In Her Home Location of provider: Office Established patient Time spent on call: 10 Minutes

## 2018-12-08 ENCOUNTER — Telehealth: Payer: Self-pay | Admitting: *Deleted

## 2018-12-08 NOTE — Telephone Encounter (Signed)
Kara Bush has been having increased back pain and she had some previously prescribed methocarbamol from 2018  from Dr Naaman Plummer that has helped.  She is asking for refills on this medication.

## 2018-12-09 MED ORDER — METHOCARBAMOL 500 MG PO TABS: 500.0000 mg | ORAL_TABLET | Freq: Three times a day (TID) | ORAL | 4 refills | Status: DC | PRN

## 2018-12-09 NOTE — Telephone Encounter (Signed)
Medication filled.  

## 2019-01-06 ENCOUNTER — Other Ambulatory Visit: Payer: Self-pay | Admitting: Internal Medicine

## 2019-01-25 DIAGNOSIS — H04123 Dry eye syndrome of bilateral lacrimal glands: Secondary | ICD-10-CM | POA: Diagnosis not present

## 2019-01-25 DIAGNOSIS — H02889 Meibomian gland dysfunction of unspecified eye, unspecified eyelid: Secondary | ICD-10-CM | POA: Diagnosis not present

## 2019-01-27 DIAGNOSIS — D225 Melanocytic nevi of trunk: Secondary | ICD-10-CM | POA: Diagnosis not present

## 2019-01-27 DIAGNOSIS — B351 Tinea unguium: Secondary | ICD-10-CM | POA: Diagnosis not present

## 2019-01-27 DIAGNOSIS — Z85828 Personal history of other malignant neoplasm of skin: Secondary | ICD-10-CM | POA: Diagnosis not present

## 2019-01-27 DIAGNOSIS — D1801 Hemangioma of skin and subcutaneous tissue: Secondary | ICD-10-CM | POA: Diagnosis not present

## 2019-02-07 ENCOUNTER — Encounter: Payer: Medicare Other | Attending: Physical Medicine and Rehabilitation | Admitting: Registered Nurse

## 2019-02-07 ENCOUNTER — Other Ambulatory Visit: Payer: Self-pay

## 2019-02-07 ENCOUNTER — Encounter: Payer: Self-pay | Admitting: Registered Nurse

## 2019-02-07 ENCOUNTER — Other Ambulatory Visit: Payer: Self-pay | Admitting: Internal Medicine

## 2019-02-07 VITALS — BP 156/82 | HR 76 | Temp 97.6°F | Resp 12 | Ht 64.0 in | Wt 177.0 lb

## 2019-02-07 DIAGNOSIS — M797 Fibromyalgia: Secondary | ICD-10-CM | POA: Insufficient documentation

## 2019-02-07 DIAGNOSIS — Z79899 Other long term (current) drug therapy: Secondary | ICD-10-CM | POA: Insufficient documentation

## 2019-02-07 DIAGNOSIS — G894 Chronic pain syndrome: Secondary | ICD-10-CM

## 2019-02-07 DIAGNOSIS — Z5181 Encounter for therapeutic drug level monitoring: Secondary | ICD-10-CM

## 2019-02-07 DIAGNOSIS — M545 Low back pain, unspecified: Secondary | ICD-10-CM

## 2019-02-07 DIAGNOSIS — R5382 Chronic fatigue, unspecified: Secondary | ICD-10-CM | POA: Insufficient documentation

## 2019-02-07 DIAGNOSIS — G8929 Other chronic pain: Secondary | ICD-10-CM

## 2019-02-07 DIAGNOSIS — M25511 Pain in right shoulder: Secondary | ICD-10-CM | POA: Diagnosis not present

## 2019-02-07 DIAGNOSIS — M4302 Spondylolysis, cervical region: Secondary | ICD-10-CM | POA: Diagnosis not present

## 2019-02-07 DIAGNOSIS — Z79891 Long term (current) use of opiate analgesic: Secondary | ICD-10-CM

## 2019-02-07 MED ORDER — HYDROCODONE-ACETAMINOPHEN 5-325 MG PO TABS: 1.0000 | ORAL_TABLET | Freq: Four times a day (QID) | ORAL | 0 refills | Status: DC | PRN

## 2019-02-07 NOTE — Progress Notes (Signed)
Subjective:    Patient ID: Kara Bush, female    DOB: February 13, 1941, 78 y.o.   MRN: [id number]  HPI: Kara Bush is a 78 y.o. female who returns for follow up appointment for chronic pain and medication refill. She states her pain is located in her right shoulder and lower back. She rates her pain 4. Her current exercise regime is walking.  [name] Morphine equivalent is 20.00 MME.  Last Oral Swab was Performed on 08/10/2018, it was consistent.   Pain Inventory Average Pain 4 Pain Right Now 4 My pain is n/a  In the last 24 hours, has pain interfered with the following? General activity 0 Relation with others 0 Enjoyment of life 0 What TIME of day is your pain at its worst? morning Sleep (in general) Good  Pain is worse with: bending and some activites Pain improves with: rest and medication Relief from Meds: n/a  Mobility Do you have any goals in this area?  no  Function retired  Neuro/Psych No problems in this area  Prior Studies no  Physicians involved in your care no   Family History  Problem Relation Age of Onset  . Heart disease Father   . Heart attack Father        @ 68  . Cancer Paternal Aunt        BREAST  . Heart failure Mother        at 43  . Healthy Sister   . Cancer Brother        pancreatic   . Heart failure Maternal Grandmother   . Heart attack Maternal Grandfather   . Heart attack Paternal Grandmother        at 54  . Healthy Daughter   . Healthy Daughter    Social History   Socioeconomic History  . Marital status: Divorced    Spouse name: Not on file  . Number of children: 2  . Years of education: college   . Highest education level: Not on file  Occupational History    Employer: RETIRED  Social Needs  . Financial resource strain: Not on file  . Food insecurity    Worry: Not on file    Inability: Not on file  . Transportation needs    Medical: Not on file    Non-medical: Not on file  Tobacco Use  . Smoking  status: Former Smoker    Packs/day: 2.50    Years: 20.00    Pack years: 50.00    Types: Cigarettes    Quit date: 06/29/1990    Years since quitting: 28.6  . Smokeless tobacco: Never Used  Substance and Sexual Activity  . Alcohol use: No  . Drug use: No  . Sexual activity: Not on file  Lifestyle  . Physical activity    Days per week: Not on file    Minutes per session: Not on file  . Stress: Not on file  Relationships  . Social Herbalist on phone: Not on file    Gets together: Not on file    Attends religious service: Not on file    Active member of club or organization: Not on file    Attends meetings of clubs or organizations: Not on file    Relationship status: Not on file  Other Topics Concern  . Not on file  Social History Narrative  . Not on file   Past Surgical History:  Procedure Laterality Date  . AORTOBIEXTERNAL ILIAC  BYPASS  1991  . CHOLECYSTECTOMY  1997  . CORONARY ANGIOPLASTY WITH STENT PLACEMENT  2006   to LAD, ATRETIC LIMA AT THAT TIME ALSO  . CORONARY ANGIOPLASTY WITH STENT PLACEMENT  2007   STENT TO PROX VG-DIAG, OTHER STENTS PATENT  . CORONARY ARTERY BYPASS GRAFT  1998   LIMA-LAD;VG-DIAG & RCA  . CORONARY STENT PLACEMENT  2005   TO LAD & LCX-STENTS,  . HERNIA REPAIR    . TUBAL LIGATION     BTL   Past Medical History:  Diagnosis Date  . Abdominal aortic aneurysm ([place])   . Anemia, improved 01/11/2013  . Basal cell carcinoma   . CAD (coronary artery disease)    hx CABG 1998, last cath 2007 with PCI and stenting to the proximal segment of the vein graft to the diagonal branch. DES Taxus was placed  . Cervicalgia   . Chest wall pain   . Chronic fatigue fibromyalgia syndrome   . Chronic pain syndrome   . Coronary artery disease   . Depression   . Fibromyalgia   . Fibromyalgia   . History of nuclear stress test 08/07/2011   lexiscan; no evidence of ischemia or infarct; low risk   . Hyperlipidemia   . Hypertension   . Hypertension   .  Obstructive sleep apnea   . OSA (obstructive sleep apnea)   . PAD (peripheral artery disease) ([place])   . Pes anserine bursitis   . Pulmonary embolism ([place]) 01/11/2013  . S/P resection of aortic aneurysm   . Trochanteric bursitis   . Ulcerative colitis   . Ulcerative colitis ([place])    There were no vitals taken for this visit.  Opioid Risk Score:   Fall Risk Score:  `1  Depression screen PHQ 2/9  Depression screen [facility] 2/9 04/13/2018 02/12/2018 12/11/2017 08/18/2017 08/21/2015 11/07/2014 05/25/2013  Decreased Interest 0 0 0 0 0 0 0  Down, Depressed, Hopeless 0 0 0 0 0 0 0  PHQ - 2 Score 0 0 0 0 0 0 0  Tired, decreased energy - - - - - 1 -  Change in appetite - - - - - 1 -  Feeling bad or failure about yourself  - - - - - 0 -  Trouble concentrating - - - - - 0 -  Moving slowly or fidgety/restless - - - - - 0 -  Suicidal thoughts - - - - - 0 -     Review of Systems     Objective:   Physical Exam Vitals signs and nursing note reviewed.  Constitutional:      Appearance: Normal appearance.  Neck:     Musculoskeletal: Normal range of motion and neck supple.  Cardiovascular:     Rate and Rhythm: Normal rate and regular rhythm.     Pulses: Normal pulses.     Heart sounds: Normal heart sounds.  Pulmonary:     Effort: Pulmonary effort is normal.     Breath sounds: Normal breath sounds.  Musculoskeletal:     Comments: Normal Muscle Bulk and Muscle Testing Reveals:  Upper Extremities: Full ROM and Muscle Strength 5/5 Thoracic Paraspinal Tenderness: T-7-T-9 Mainly Right Side   Lumbar Paraspinal Tenderness: L-4-L-5 Lower Extremities: Full ROM and Muscle Strength 5/5 Arises from chair with ease Narrow Based Gait   Skin:    General: Skin is warm and dry.  Neurological:     Mental Status: She is alert and oriented to person, place, and time.  Psychiatric:  Mood and Affect: Mood normal.        Behavior: Behavior normal.           Assessment & Plan:  1.Cervical spondylosis,  cervicalgia with radiation to right scapular region: Continue to Monitor, Continue Current Medication and exercise regime.02/07/2019 2. Fibromyalgia: Continue Pool therapyand Home Exercise Program.02/07/2019 3. Bilateral intermittent hand numbness: Carpal tunnel syndrome versus C6 radiculopathy mild:No complaints today.Continue to Monitor.02/07/2019 4.Chronic Midline Low Back Pain/LBP: Refilled: Norco 5/325mg  #120pills--use one pill every 6 hours as neededfor pain. A second prescription wase-scribefor the followingmonth.02/07/2019. We will continue the opioid monitoring program, this consists of regular clinic visits, examinations, urine drug screen, pill counts as well as use of [place] Controlled Substance Reporting System.  5. Muscle Spasm: Continuecurrent medication regimen withRobaxin.02/07/2019 6.RightGreater Trochanteric Bursitis: Continue to Alternate with heat and ice therapy. Continue current medication regime.02/07/2019  F/U in 2 months  15 minutes of face to face patient care time was spent during this visit. All questions were encouraged and answered.

## 2019-03-22 DIAGNOSIS — Z7901 Long term (current) use of anticoagulants: Secondary | ICD-10-CM | POA: Diagnosis not present

## 2019-03-22 DIAGNOSIS — Z8669 Personal history of other diseases of the nervous system and sense organs: Secondary | ICD-10-CM | POA: Diagnosis not present

## 2019-03-22 DIAGNOSIS — N904 Leukoplakia of vulva: Secondary | ICD-10-CM | POA: Diagnosis not present

## 2019-03-22 DIAGNOSIS — I4891 Unspecified atrial fibrillation: Secondary | ICD-10-CM | POA: Diagnosis not present

## 2019-03-22 DIAGNOSIS — M25551 Pain in right hip: Secondary | ICD-10-CM | POA: Diagnosis not present

## 2019-03-30 DIAGNOSIS — R262 Difficulty in walking, not elsewhere classified: Secondary | ICD-10-CM | POA: Diagnosis not present

## 2019-03-30 DIAGNOSIS — M25551 Pain in right hip: Secondary | ICD-10-CM | POA: Diagnosis not present

## 2019-04-01 DIAGNOSIS — R262 Difficulty in walking, not elsewhere classified: Secondary | ICD-10-CM | POA: Diagnosis not present

## 2019-04-01 DIAGNOSIS — M25551 Pain in right hip: Secondary | ICD-10-CM | POA: Diagnosis not present

## 2019-04-04 DIAGNOSIS — I10 Essential (primary) hypertension: Secondary | ICD-10-CM | POA: Diagnosis not present

## 2019-04-04 DIAGNOSIS — F339 Major depressive disorder, recurrent, unspecified: Secondary | ICD-10-CM | POA: Diagnosis not present

## 2019-04-04 DIAGNOSIS — I4891 Unspecified atrial fibrillation: Secondary | ICD-10-CM | POA: Diagnosis not present

## 2019-04-04 DIAGNOSIS — I2510 Atherosclerotic heart disease of native coronary artery without angina pectoris: Secondary | ICD-10-CM | POA: Diagnosis not present

## 2019-04-04 DIAGNOSIS — M1990 Unspecified osteoarthritis, unspecified site: Secondary | ICD-10-CM | POA: Diagnosis not present

## 2019-04-06 DIAGNOSIS — R262 Difficulty in walking, not elsewhere classified: Secondary | ICD-10-CM | POA: Diagnosis not present

## 2019-04-06 DIAGNOSIS — M25551 Pain in right hip: Secondary | ICD-10-CM | POA: Diagnosis not present

## 2019-04-08 DIAGNOSIS — M25551 Pain in right hip: Secondary | ICD-10-CM | POA: Diagnosis not present

## 2019-04-08 DIAGNOSIS — R262 Difficulty in walking, not elsewhere classified: Secondary | ICD-10-CM | POA: Diagnosis not present

## 2019-04-12 ENCOUNTER — Encounter: Payer: Medicare Other | Attending: Physical Medicine and Rehabilitation | Admitting: Registered Nurse

## 2019-04-12 ENCOUNTER — Encounter: Payer: Self-pay | Admitting: Registered Nurse

## 2019-04-12 ENCOUNTER — Other Ambulatory Visit: Payer: Self-pay

## 2019-04-12 VITALS — BP 146/84 | HR 86 | Temp 98.7°F | Ht 64.0 in | Wt 177.6 lb

## 2019-04-12 DIAGNOSIS — M7061 Trochanteric bursitis, right hip: Secondary | ICD-10-CM | POA: Diagnosis not present

## 2019-04-12 DIAGNOSIS — Z5181 Encounter for therapeutic drug level monitoring: Secondary | ICD-10-CM | POA: Insufficient documentation

## 2019-04-12 DIAGNOSIS — G8929 Other chronic pain: Secondary | ICD-10-CM

## 2019-04-12 DIAGNOSIS — G894 Chronic pain syndrome: Secondary | ICD-10-CM

## 2019-04-12 DIAGNOSIS — Z79899 Other long term (current) drug therapy: Secondary | ICD-10-CM | POA: Insufficient documentation

## 2019-04-12 DIAGNOSIS — M4302 Spondylolysis, cervical region: Secondary | ICD-10-CM | POA: Insufficient documentation

## 2019-04-12 DIAGNOSIS — M545 Low back pain: Secondary | ICD-10-CM | POA: Diagnosis not present

## 2019-04-12 DIAGNOSIS — M797 Fibromyalgia: Secondary | ICD-10-CM | POA: Insufficient documentation

## 2019-04-12 DIAGNOSIS — Z79891 Long term (current) use of opiate analgesic: Secondary | ICD-10-CM | POA: Diagnosis not present

## 2019-04-12 DIAGNOSIS — R5382 Chronic fatigue, unspecified: Secondary | ICD-10-CM | POA: Insufficient documentation

## 2019-04-12 MED ORDER — HYDROCODONE-ACETAMINOPHEN 5-325 MG PO TABS: 1.0000 | ORAL_TABLET | Freq: Four times a day (QID) | ORAL | 0 refills | Status: DC | PRN

## 2019-04-12 NOTE — Progress Notes (Signed)
Subjective:    Patient ID: Kara Bush, female    DOB: August 17, 1940, 78 y.o.   MRN: [id number]  HPI: Kara Bush is a 78 y.o. female who returns for follow up appointment for chronic pain and medication refill. She states her pain is located in her mid- back and right hip. She  Rates her pain 5. Her current exercise regime is walking, attending physical therapy two days a week and performing stretching exercises.  Kara Bush equivalent is  20.00  MME. Oral Swab Performed today.    Pain Inventory Average Pain 4 Pain Right Now 5 My pain is constant and aching  In the last 24 hours, has pain interfered with the following? General activity 1 Relation with others 0 Enjoyment of life 1 What TIME of day is your pain at its worst? daytime Sleep (in general) Good  Pain is worse with: bending and some activites Pain improves with: heat/ice, therapy/exercise, pacing activities and medication Relief from Meds: good  Mobility walk without assistance how many minutes can you walk? 5 ability to climb steps?  yes do you drive?  yes Do you have any goals in this area?  no  Function retired Do you have any goals in this area?  no  Neuro/Psych No problems in this area  Prior Studies Any changes since last visit?  no  Physicians involved in your care Any changes since last visit?  no   Family History  Problem Relation Age of Onset  . Heart disease Father   . Heart attack Father        @ 73  . Cancer Paternal Aunt        BREAST  . Heart failure Mother        at 58  . Healthy Sister   . Cancer Brother        pancreatic   . Heart failure Maternal Grandmother   . Heart attack Maternal Grandfather   . Heart attack Paternal Grandmother        at 22  . Healthy Daughter   . Healthy Daughter    Social History   Socioeconomic History  . Marital status: Divorced    Spouse name: Not on file  . Number of children: 2  . Years of education: college   .  Highest education level: Not on file  Occupational History    Employer: RETIRED  Social Needs  . Financial resource strain: Not on file  . Food insecurity    Worry: Not on file    Inability: Not on file  . Transportation needs    Medical: Not on file    Non-medical: Not on file  Tobacco Use  . Smoking status: Former Smoker    Packs/day: 2.50    Years: 20.00    Pack years: 50.00    Types: Cigarettes    Quit date: 06/29/1990    Years since quitting: 28.8  . Smokeless tobacco: Never Used  Substance and Sexual Activity  . Alcohol use: No  . Drug use: No  . Sexual activity: Not on file  Lifestyle  . Physical activity    Days per week: Not on file    Minutes per session: Not on file  . Stress: Not on file  Relationships  . Social Herbalist on phone: Not on file    Gets together: Not on file    Attends religious service: Not on file    Active member of club  or organization: Not on file    Attends meetings of clubs or organizations: Not on file    Relationship status: Not on file  Other Topics Concern  . Not on file  Social History Narrative  . Not on file   Past Surgical History:  Procedure Laterality Date  . [place]  . CHOLECYSTECTOMY  1997  . CORONARY ANGIOPLASTY WITH STENT PLACEMENT  2006   to LAD, ATRETIC [place] AT THAT TIME ALSO  . CORONARY ANGIOPLASTY WITH STENT PLACEMENT  2007   STENT TO [place] VG-DIAG, OTHER STENTS PATENT  . CORONARY ARTERY BYPASS GRAFT  1998   [place]-LAD;VG-DIAG & RCA  . CORONARY STENT PLACEMENT  2005   TO LAD & LCX-STENTS,  . HERNIA REPAIR    . TUBAL LIGATION     BTL   Past Medical History:  Diagnosis Date  . Abdominal aortic aneurysm ([place])   . Anemia, improved 01/11/2013  . Basal cell carcinoma   . CAD (coronary artery disease)    hx CABG 1998, last cath 2007 with PCI and stenting to the proximal segment of the vein graft to the diagonal branch. DES Taxus was placed  . Cervicalgia   . Chest wall pain    . Chronic fatigue fibromyalgia syndrome   . Chronic pain syndrome   . Coronary artery disease   . Depression   . Fibromyalgia   . Fibromyalgia   . History of nuclear stress test 08/07/2011   lexiscan; no evidence of ischemia or infarct; low risk   . Hyperlipidemia   . Hypertension   . Hypertension   . Obstructive sleep apnea   . OSA (obstructive sleep apnea)   . PAD (peripheral artery disease) ([place])   . Pes anserine bursitis   . Pulmonary embolism ([place]) 01/11/2013  . S/P resection of aortic aneurysm   . Trochanteric bursitis   . Ulcerative colitis   . Ulcerative colitis (HCC)    BP (!) 146/84   Pulse 86   Temp 98.7 F (37.1 C)   Ht 5\' 4"  (1.626 m)   Wt 177 lb 9.6 oz (80.6 kg)   SpO2 93%   BMI 30.48 kg/m   Opioid Risk Score:   Fall Risk Score:  `1  Depression screen PHQ 2/9  Depression screen [facility] 2/9 04/13/2018 02/12/2018 12/11/2017 08/18/2017 08/21/2015 11/07/2014 05/25/2013  Decreased Interest 0 0 0 0 0 0 0  Down, Depressed, Hopeless 0 0 0 0 0 0 0  PHQ - 2 Score 0 0 0 0 0 0 0  Tired, decreased energy - - - - - 1 -  Change in appetite - - - - - 1 -  Feeling bad or failure about yourself  - - - - - 0 -  Trouble concentrating - - - - - 0 -  Moving slowly or fidgety/restless - - - - - 0 -  Suicidal thoughts - - - - - 0 -    Review of Systems  Constitutional: Negative.   HENT: Negative.   Eyes: Negative.   Respiratory: Negative.   Cardiovascular: Negative.   Gastrointestinal: Negative.   Endocrine: Negative.   Genitourinary: Negative.   Musculoskeletal: Negative.   Skin: Negative.   Allergic/Immunologic: Negative.   Neurological: Negative.   Hematological: Negative.   Psychiatric/Behavioral: Negative.   All other systems reviewed and are negative.      Objective:   Physical Exam Vitals signs and nursing note reviewed.  Constitutional:      Appearance:  Normal appearance.  Neck:     Musculoskeletal: Normal range of motion and neck supple.  Cardiovascular:      Rate and Rhythm: Normal rate and regular rhythm.     Pulses: Normal pulses.     Heart sounds: Normal heart sounds.  Pulmonary:     Effort: Pulmonary effort is normal.     Breath sounds: Normal breath sounds.  Musculoskeletal:     Comments: Normal Muscle Bulk and Muscle Testing Reveals:  Upper Extremities: Full ROM and Muscle Strength 5/5 Right Greater Trochanter Tenderness Lower Extremities: Full ROM and Muscle Strength 5/5 Right Greater Trochanter Tenderness Bilateral Lower Extremities: Full ROM and Muscle Strength 5/5 Arises from chair with ease Narrow Based Gait   Skin:    General: Skin is warm and dry.  Neurological:     Mental Status: She is alert and oriented to person, place, and time.  Psychiatric:        Mood and Affect: Mood normal.           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 Fibromyalgia: Continue Pool therapyand Home Exercise Program.04/12/2019 3. Bilateral intermittent hand numbness: Carpal tunnel syndrome versus C6 radiculopathy mild:No complaints today.Continue to Monitor.04/12/2019 4.Chronic Midline Low Back Pain/LBP: Refilled: Norco 5/325mg  #120pills--use one pill every 6 hours as neededfor pain. A second prescription wase-scribefor the followingmonth.04/12/2019. We will continue the opioid monitoring program, this consists of regular clinic visits, examinations, urine drug screen, pill counts as well as use of [place] Controlled Substance Reporting System.  5. Muscle Spasm: Continuecurrent medication regimen withRobaxin.04/12/2019 6.RightGreater Trochanteric Bursitis: Continue to Alternate with heat and ice therapy. Continue current medication regime.04/12/2019  F/U in 2 months  15 minutes of face to face patient care time was spent during this visit. All questions were encouraged  and answered.

## 2019-04-13 DIAGNOSIS — R262 Difficulty in walking, not elsewhere classified: Secondary | ICD-10-CM | POA: Diagnosis not present

## 2019-04-13 DIAGNOSIS — M25551 Pain in right hip: Secondary | ICD-10-CM | POA: Diagnosis not present

## 2019-04-15 DIAGNOSIS — R262 Difficulty in walking, not elsewhere classified: Secondary | ICD-10-CM | POA: Diagnosis not present

## 2019-04-15 DIAGNOSIS — M25551 Pain in right hip: Secondary | ICD-10-CM | POA: Diagnosis not present

## 2019-04-15 LAB — DRUG TOX MONITOR 1 W/CONF, ORAL FLD
Amphetamines: NEGATIVE ng/mL (ref <10)
Barbiturates: NEGATIVE ng/mL (ref <10)
Benzodiazepines: NEGATIVE ng/mL (ref <0.50)
Buprenorphine: NEGATIVE ng/mL (ref <0.10)
Cocaine: NEGATIVE ng/mL (ref <5.0)
Codeine: NEGATIVE ng/mL (ref <2.5)
Dihydrocodeine: 16.6 ng/mL — ABNORMAL HIGH (ref <2.5)
Fentanyl: NEGATIVE ng/mL (ref <0.10)
Heroin Metabolite: NEGATIVE ng/mL (ref <1.0)
Hydrocodone: 115.4 ng/mL — ABNORMAL HIGH (ref <2.5)
Hydromorphone: NEGATIVE ng/mL (ref <2.5)
MARIJUANA: NEGATIVE ng/mL (ref <2.5)
MDMA: NEGATIVE ng/mL (ref <10)
Meprobamate: NEGATIVE ng/mL (ref <2.5)
Methadone: NEGATIVE ng/mL (ref <5.0)
Morphine: NEGATIVE ng/mL (ref <2.5)
Nicotine Metabolite: NEGATIVE ng/mL (ref <5.0)
Norhydrocodone: 4.5 ng/mL — ABNORMAL HIGH (ref <2.5)
Noroxycodone: NEGATIVE ng/mL (ref <2.5)
Opiates: POSITIVE ng/mL — AB (ref <2.5)
Oxycodone: NEGATIVE ng/mL (ref <2.5)
Oxymorphone: NEGATIVE ng/mL (ref <2.5)
Phencyclidine: NEGATIVE ng/mL (ref <10)
Tapentadol: NEGATIVE ng/mL (ref <5.0)
Tramadol: NEGATIVE ng/mL (ref <5.0)
Zolpidem: NEGATIVE ng/mL (ref <5.0)

## 2019-04-15 LAB — DRUG TOX ALC METAB W/CON, ORAL FLD: Alcohol Metabolite: NEGATIVE ng/mL (ref <25)

## 2019-04-18 DIAGNOSIS — M25551 Pain in right hip: Secondary | ICD-10-CM | POA: Diagnosis not present

## 2019-04-18 DIAGNOSIS — R262 Difficulty in walking, not elsewhere classified: Secondary | ICD-10-CM | POA: Diagnosis not present

## 2019-04-18 DIAGNOSIS — R195 Other fecal abnormalities: Secondary | ICD-10-CM | POA: Diagnosis not present

## 2019-04-18 DIAGNOSIS — S300XXA Contusion of lower back and pelvis, initial encounter: Secondary | ICD-10-CM | POA: Diagnosis not present

## 2019-04-18 DIAGNOSIS — K5190 Ulcerative colitis, unspecified, without complications: Secondary | ICD-10-CM | POA: Diagnosis not present

## 2019-04-19 ENCOUNTER — Telehealth: Payer: Self-pay | Admitting: Physician Assistant

## 2019-04-19 ENCOUNTER — Telehealth: Payer: Self-pay

## 2019-04-19 NOTE — Telephone Encounter (Signed)
Pt takes Xarelto for afib with CHADS2VASc score of 5 (age x2, sex, HTN, CAD/PAD), also with hx of PE in 2014. CrCl is 22mL/min. Recommend holding Xarelto for 1 day prior to procedure.

## 2019-04-19 NOTE — Telephone Encounter (Signed)
   Reviewed chart. Will route to Pharmacy 1st to address anticoagulation.  Pt will then need phone call to assess for clinical changes since last seen.  Past Medical Hx  Coronary artery disease  s/p CABG 1998  s/p multiple PCI procedures since CABG  Myoview 08/2011: No ischemia, EF 77; low risk  Peripheral arterial disease  s/p aortoiliac bypass 1991  Moderate subclavian stenosis by chest CTA January 2020  Thoracic aortic aneurysm  CT 08/2018: 4.2 cm  Paroxysmal atrial fibrillation  Anticoagulation: Xarelto  Prior history of pulmonary embolism  Hyperlipidemia  Last OV with Dr. Debara Pickett: 10/27/2018 (telemedicine) Richardson Dopp, PA-C    04/19/2019 12:57 PM

## 2019-04-19 NOTE — Telephone Encounter (Signed)
   Primary Cardiologist: Pixie Casino, MD  Chart reviewed as part of pre-operative protocol coverage. Patient was contacted 04/19/2019 in reference to pre-operative risk assessment for pending surgery as outlined below.  Kara Bush was last seen in March 2020 by Dr. Debara Pickett.  Since then, Kara Bush has not had chest pain, significant shortness of breath, syncope.  As an aside, she tells me she started taking Xarelto every other day due to nose bleeds. I will send a note separately to Dr. Debara Pickett to address.   RCRI:  0.9% (low risk for perioperative major cardiac event).  Per PharmD, the patient may hold Xarelto for 1 day prior to procedure.  Therefore, based on ACC/AHA guidelines, the patient would be at acceptable risk for the planned procedure without further cardiovascular testing.   Call back staff: I have faxed a letter to the requesting surgeon. Please call their office to make sure it was received. I will remove this note from the Preop APP Pool.   Richardson Dopp, PA-C 04/19/2019, 4:10 PM

## 2019-04-19 NOTE — Telephone Encounter (Signed)
Per Richardson Dopp, PAC I called surgeon's office to confirm surgery clearance has been received. I  lmom clearance faxed over today. Please call the office if have not received clearance. I will remove from the Pre Op Call back pool .

## 2019-04-19 NOTE — Telephone Encounter (Addendum)
   [facility] Pre-operative Risk Assessment    Request for surgical clearance:  1. What type of surgery is being performed? Colonoscopy  2. When is this surgery scheduled? 04/28/2019   3. What type of clearance is required (medical clearance vs. Pharmacy clearance to hold med vs. Both)? Both  4. Are there any medications that need to be held prior to surgery and how long? Xarelto - unknown  5. Practice name and name of physician performing surgery? [facility]: Dr. Benson Norway  6. What is your office phone number? [phone]    7.   What is your office fax number? [phone]  8.   Anesthesia type (None, local, MAC, general) ? MAC: Propofol    Keith Cancio 04/19/2019, 7:33 AM  _________________________________________________________________   (provider comments below)

## 2019-04-19 NOTE — Telephone Encounter (Signed)
Thanks [name] -obviously we were not aware of that. I agree with continuing Xarelto until after her colonoscopy and we could then discuss switching to Eliquis.  -Mali

## 2019-04-19 NOTE — Telephone Encounter (Signed)
Patient contacted today regarding surgical clearance for upcoming colo. She noted that she reduced her Xarelto on her own to every other day due to recent epistaxis.  I advised her to take her Xarelto daily as taking it less than that increases her risk of CVA.  I will route this note to Dr. Debara Pickett and his RN to determine if she can change to Eliquis.  It may be best, if she can change to Eliquis, to wait to make the change until after her colo later this month. Richardson Dopp, PA-C    04/19/2019 4:23 PM

## 2019-04-20 ENCOUNTER — Other Ambulatory Visit: Payer: Self-pay

## 2019-04-20 ENCOUNTER — Telehealth: Payer: Self-pay | Admitting: *Deleted

## 2019-04-20 DIAGNOSIS — Z20822 Contact with and (suspected) exposure to covid-19: Secondary | ICD-10-CM

## 2019-04-20 NOTE — Telephone Encounter (Signed)
Oral swab drug screen was consistent for prescribed medications.  ?

## 2019-04-21 LAB — NOVEL CORONAVIRUS, NAA: SARS-CoV-2, NAA: NOT DETECTED

## 2019-04-22 NOTE — Telephone Encounter (Signed)
Spoke with patient who reports she was having GUM BLEEDS. She will call our office once she is finished with colonoscopy to change anticoagulant per MD

## 2019-04-28 DIAGNOSIS — K635 Polyp of colon: Secondary | ICD-10-CM | POA: Diagnosis not present

## 2019-04-28 DIAGNOSIS — R195 Other fecal abnormalities: Secondary | ICD-10-CM | POA: Diagnosis not present

## 2019-04-28 DIAGNOSIS — K5520 Angiodysplasia of colon without hemorrhage: Secondary | ICD-10-CM | POA: Diagnosis not present

## 2019-04-28 DIAGNOSIS — K5730 Diverticulosis of large intestine without perforation or abscess without bleeding: Secondary | ICD-10-CM | POA: Diagnosis not present

## 2019-04-28 DIAGNOSIS — K5100 Ulcerative (chronic) pancolitis without complications: Secondary | ICD-10-CM | POA: Diagnosis not present

## 2019-04-28 DIAGNOSIS — K5190 Ulcerative colitis, unspecified, without complications: Secondary | ICD-10-CM | POA: Diagnosis not present

## 2019-04-28 DIAGNOSIS — K529 Noninfective gastroenteritis and colitis, unspecified: Secondary | ICD-10-CM | POA: Diagnosis not present

## 2019-05-01 ENCOUNTER — Other Ambulatory Visit: Payer: Self-pay | Admitting: Internal Medicine

## 2019-05-02 DIAGNOSIS — R262 Difficulty in walking, not elsewhere classified: Secondary | ICD-10-CM | POA: Diagnosis not present

## 2019-05-02 DIAGNOSIS — M25551 Pain in right hip: Secondary | ICD-10-CM | POA: Diagnosis not present

## 2019-05-09 DIAGNOSIS — M25551 Pain in right hip: Secondary | ICD-10-CM | POA: Diagnosis not present

## 2019-05-09 DIAGNOSIS — R262 Difficulty in walking, not elsewhere classified: Secondary | ICD-10-CM | POA: Diagnosis not present

## 2019-05-27 DIAGNOSIS — I2510 Atherosclerotic heart disease of native coronary artery without angina pectoris: Secondary | ICD-10-CM | POA: Diagnosis not present

## 2019-05-27 DIAGNOSIS — E7800 Pure hypercholesterolemia, unspecified: Secondary | ICD-10-CM | POA: Diagnosis not present

## 2019-05-27 DIAGNOSIS — M1990 Unspecified osteoarthritis, unspecified site: Secondary | ICD-10-CM | POA: Diagnosis not present

## 2019-05-27 DIAGNOSIS — I10 Essential (primary) hypertension: Secondary | ICD-10-CM | POA: Diagnosis not present

## 2019-05-27 DIAGNOSIS — F339 Major depressive disorder, recurrent, unspecified: Secondary | ICD-10-CM | POA: Diagnosis not present

## 2019-05-27 DIAGNOSIS — I4891 Unspecified atrial fibrillation: Secondary | ICD-10-CM | POA: Diagnosis not present

## 2019-06-09 ENCOUNTER — Ambulatory Visit
Admission: RE | Admit: 2019-06-09 | Discharge: 2019-06-09 | Disposition: A | Discharge status: Home / Self Care | Payer: Medicare Other | Source: Ambulatory Visit | Attending: Registered Nurse | Admitting: Registered Nurse

## 2019-06-09 ENCOUNTER — Encounter: Payer: Self-pay | Admitting: Registered Nurse

## 2019-06-09 ENCOUNTER — Encounter: Payer: Medicare Other | Attending: Physical Medicine and Rehabilitation | Admitting: Registered Nurse

## 2019-06-09 ENCOUNTER — Other Ambulatory Visit: Payer: Self-pay

## 2019-06-09 VITALS — BP 167/84 | HR 80 | Temp 97.7°F | Ht 64.0 in | Wt 176.0 lb

## 2019-06-09 DIAGNOSIS — G8929 Other chronic pain: Secondary | ICD-10-CM

## 2019-06-09 DIAGNOSIS — G894 Chronic pain syndrome: Secondary | ICD-10-CM | POA: Diagnosis not present

## 2019-06-09 DIAGNOSIS — Z79891 Long term (current) use of opiate analgesic: Secondary | ICD-10-CM

## 2019-06-09 DIAGNOSIS — M79672 Pain in left foot: Secondary | ICD-10-CM | POA: Insufficient documentation

## 2019-06-09 DIAGNOSIS — Z79899 Other long term (current) drug therapy: Secondary | ICD-10-CM | POA: Insufficient documentation

## 2019-06-09 DIAGNOSIS — M545 Low back pain: Secondary | ICD-10-CM | POA: Diagnosis not present

## 2019-06-09 DIAGNOSIS — R5382 Chronic fatigue, unspecified: Secondary | ICD-10-CM | POA: Insufficient documentation

## 2019-06-09 DIAGNOSIS — M4302 Spondylolysis, cervical region: Secondary | ICD-10-CM | POA: Diagnosis not present

## 2019-06-09 DIAGNOSIS — M797 Fibromyalgia: Secondary | ICD-10-CM | POA: Insufficient documentation

## 2019-06-09 DIAGNOSIS — M7062 Trochanteric bursitis, left hip: Secondary | ICD-10-CM | POA: Diagnosis not present

## 2019-06-09 DIAGNOSIS — M7061 Trochanteric bursitis, right hip: Secondary | ICD-10-CM

## 2019-06-09 DIAGNOSIS — Z5181 Encounter for therapeutic drug level monitoring: Secondary | ICD-10-CM | POA: Diagnosis not present

## 2019-06-09 MED ORDER — HYDROCODONE-ACETAMINOPHEN 5-325 MG PO TABS: 1.0000 | ORAL_TABLET | Freq: Four times a day (QID) | ORAL | 0 refills | Status: DC | PRN

## 2019-06-09 NOTE — Progress Notes (Signed)
Subjective:    Patient ID: Kara Bush, female    DOB: 07/16/1941, 78 y.o.   MRN: [id number]  HPI: Kara Bush is a 78 y.o. female who returns for follow up appointment for chronic pain and medication refill. She states her pain is located in her mid- back, bilateral hips and left foot pain occassionally. Also reports her left foot pain is waxing and waning for the last two months, we will order a x-ray, she denies falling. She   rates her pain 3. Her  current exercise regime is walking and she was encouraged to increase HEP as tolerated.   [name] Morphine equivalent is 20.00 MME.  Last Oral Swab was Performed on 04/20/2019, it was consistent.   Pain Inventory Average Pain 4 Pain Right Now 3 My pain is intermittent, sharp and stabbing  In the last 24 hours, has pain interfered with the following? General activity 4 Relation with others 1 Enjoyment of life 5 What TIME of day is your pain at its worst? morning Sleep (in general) Good  Pain is worse with: bending and some activites Pain improves with: rest, pacing activities and medication Relief from Meds: 8  Mobility walk without assistance Do you have any goals in this area?  no  Function Do you have any goals in this area?  no  Neuro/Psych No problems in this area  Prior Studies Any changes since last visit?  no  Physicians involved in your care Any changes since last visit?  no   Family History  Problem Relation Age of Onset   Heart disease Father    Heart attack Father        @ 73   Cancer Paternal Aunt        BREAST   Heart failure Mother        at 62   Healthy Sister    Cancer Brother        pancreatic    Heart failure Maternal Grandmother    Heart attack Maternal Grandfather    Heart attack Paternal Grandmother        at 2   Healthy Daughter    Healthy Daughter    Social History   Socioeconomic History   Marital status: Divorced    Spouse name: Not on file    Number of children: 2   Years of education: college    Highest education level: Not on file  Occupational History    Employer: RETIRED  Social Designer, fashion/clothing strain: Not on file   Food insecurity    Worry: Not on file    Inability: Not on file   Transportation needs    Medical: Not on file    Non-medical: Not on file  Tobacco Use   Smoking status: Former Smoker    Packs/day: 2.50    Years: 20.00    Pack years: 50.00    Types: Cigarettes    Quit date: 06/29/1990    Years since quitting: 28.9   Smokeless tobacco: Never Used  Substance and Sexual Activity   Alcohol use: No   Drug use: No   Sexual activity: Not on file  Lifestyle   Physical activity    Days per week: Not on file    Minutes per session: Not on file   Stress: Not on file  Relationships   Social connections    Talks on phone: Not on file    Gets together: Not on file  Attends religious service: Not on file    Active member of club or organization: Not on file    Attends meetings of clubs or organizations: Not on file    Relationship status: Not on file  Other Topics Concern   Not on file  Social History Narrative   Not on file   Past Surgical History:  Procedure Laterality Date   [place]  2006   to LAD, ATRETIC LIMA AT THAT TIME ALSO   CORONARY ANGIOPLASTY WITH STENT PLACEMENT  2007   STENT TO [place] VG-DIAG, OTHER STENTS PATENT   CORONARY ARTERY BYPASS GRAFT  1998   [place]-LAD;VG-DIAG & RCA   CORONARY STENT PLACEMENT  2005   TO LAD & LCX-STENTS,   HERNIA REPAIR     TUBAL LIGATION     BTL   Past Medical History:  Diagnosis Date   Abdominal aortic aneurysm ([place])    Anemia, improved 01/11/2013   Basal cell carcinoma    CAD (coronary artery disease)    hx CABG 1998, last cath 2007 with PCI and stenting to the proximal segment of the vein graft to the diagonal  branch. DES Taxus was placed   Cervicalgia    Chest wall pain    Chronic fatigue fibromyalgia syndrome    Chronic pain syndrome    Coronary artery disease    Depression    Fibromyalgia    Fibromyalgia    History of nuclear stress test 08/07/2011   lexiscan; no evidence of ischemia or infarct; low risk    Hyperlipidemia    Hypertension    Hypertension    Obstructive sleep apnea    OSA (obstructive sleep apnea)    PAD (peripheral artery disease) (HCC)    Pes anserine bursitis    Pulmonary embolism ([place]) 01/11/2013   S/P resection of aortic aneurysm    Trochanteric bursitis    Ulcerative colitis (HCC)    Temp 97.7 F (36.5 C)   Opioid Risk Score:   Fall Risk Score:  `1  Depression screen PHQ 2/9  Depression screen [facility] 2/9 04/13/2018 02/12/2018 12/11/2017 08/18/2017 08/21/2015 11/07/2014 05/25/2013  Decreased Interest 0 0 0 0 0 0 0  Down, Depressed, Hopeless 0 0 0 0 0 0 0  PHQ - 2 Score 0 0 0 0 0 0 0  Tired, decreased energy - - - - - 1 -  Change in appetite - - - - - 1 -  Feeling bad or failure about yourself  - - - - - 0 -  Trouble concentrating - - - - - 0 -  Moving slowly or fidgety/restless - - - - - 0 -  Suicidal thoughts - - - - - 0 -    Review of Systems  Constitutional: Negative.   HENT: Negative.   Eyes: Negative.   Respiratory: Negative.   Cardiovascular: Negative.   Gastrointestinal: Negative.   Endocrine: Negative.   Genitourinary: Negative.   Musculoskeletal: Positive for arthralgias, back pain and myalgias.  Skin: Negative.   Allergic/Immunologic: Negative.   Neurological: Negative.   Hematological: Negative.   Psychiatric/Behavioral: Negative.   All other systems reviewed and are negative.      Objective:   Physical Exam Constitutional:      Appearance: Normal appearance.  Neck:     Musculoskeletal: Normal range of motion and neck supple.  Cardiovascular:     Rate and Rhythm: Normal rate and  regular rhythm.     Pulses: Normal  pulses.     Heart sounds: Normal heart sounds.  Pulmonary:     Effort: Pulmonary effort is normal.     Breath sounds: Normal breath sounds.  Musculoskeletal:     Comments: Normal Muscle Bulk and Muscle Testing Reveals:  Upper Extremities:Full  ROM and Muscle Strength 5/5 Thoracic Paraspinal Tenderness: T-7-T-9 Lower Extremities: Full ROM and Muscle Strength 5/5 Arises from Table with ease Narrow Based  Gait   Skin:    General: Skin is warm and dry.  Neurological:     Mental Status: She is alert and oriented to person, place, and time.  Psychiatric:        Mood and Affect: Mood normal.        Behavior: Behavior normal.           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 Fibromyalgia: Continue Pool therapyand Home Exercise Program.04/12/2019 3. Bilateral intermittent hand numbness: Carpal tunnel syndrome versus C6 radiculopathy mild:No complaints today.Continue to Monitor.06/09/2019 4.Chronic Midline Low Back Pain/LBP: Refilled: Norco 5/325mg  #120pills--use one pill every 6 hours as neededfor pain. A second prescription wase-scribefor the followingmonth.06/09/2019. We will continue the opioid monitoring program, this consists of regular clinic visits, examinations, urine drug screen, pill counts as well as use of [place] Controlled Substance Reporting System.  5. Muscle Spasm: Continuecurrent medication regimen withRobaxin.06/09/2019 6.Bilateral Greater Trochanteric Bursitis: Continue to Alternate with heat and ice therapy. Continue current medication regime.06/09/2019 7. Left Foot pain: RX: X-ray  F/U in 2 months  56minutes of face to face patient care time was spent during this visit. All questions were encouraged and answered.

## 2019-06-13 ENCOUNTER — Telehealth: Payer: Self-pay

## 2019-06-13 ENCOUNTER — Telehealth: Payer: Self-pay | Admitting: Registered Nurse

## 2019-06-13 NOTE — Telephone Encounter (Signed)
Placed a call to Kara Bush , we discussed her left foot X-ray results, she verbalizes understanding.

## 2019-06-13 NOTE — Telephone Encounter (Signed)
Patient called requesting recent xray results.

## 2019-06-13 NOTE — Telephone Encounter (Signed)
This was already addressed

## 2019-06-14 ENCOUNTER — Telehealth: Payer: Self-pay | Admitting: Internal Medicine

## 2019-06-14 NOTE — Telephone Encounter (Signed)
Attempted to contact patient- unable to reach- no voicemail to leave message. Will try again later.

## 2019-06-14 NOTE — Telephone Encounter (Signed)
  Pt c/o swelling: STAT is pt has developed SOB within 24 hours  1) How much weight have you gained and in what time span? no  2) If swelling, where is the swelling located? Left lower leg, ankle and foot  3) Are you currently taking a fluid pill? yes  4) Are you currently SOB? no  5) Do you have a log of your daily weights (if so, list)? no  6) Have you gained 3 pounds in a day or 5 pounds in a week? no  7) Have you traveled recently? No  Patient would like to speak with the nurse to see if there would be any cheaper options instead of her taking XARELTO 20 MG TABS tablet. She says the medication is too expensive.  She would also like to see if she can increase her lisinopril (ZESTRIL) 5 MG tablet because she feels she needs more than what she is on. She states her blood pressure is still going up at times.

## 2019-06-16 NOTE — Telephone Encounter (Signed)
Patient called again to check on status of call from Tuesday.  If you can't reach her at home, please try her on her cell.

## 2019-06-17 NOTE — Telephone Encounter (Signed)
She'll need an office visit - would likely recommend Eliquis over Xarelto or consider patient assistance, before switching to warfarin. Can see me or APP.  Dr. Lemmie Evens

## 2019-06-17 NOTE — Telephone Encounter (Signed)
Spoke to patient Dr.Hilty's advice given.Appointment scheduled with Dr.Hilty 06/23/19 at 3:45 pm.

## 2019-06-17 NOTE — Telephone Encounter (Signed)
Spoke to patient she stated Xarelto too expensive.She wanted to ask Dr.Hilty if he would prescribe Coumadin.She also is having pain and swelling in left foot for the past 1 month.Stated she had left foot x rayed last week and was told she has arthritis.Stated she wanted to ask Dr.Hilty if she needed to have a doppler to check circulation.She wanted to know if she needs to take more Lisinopril.Stated her systolic B/P at home has been averaging 130 or below.Stated when she has done outside home B/P usually greater than 130.Advised to monitor B/P and call back if systolic B/P [id number] or greater.Advised I will send message to Dr.Hilty for advice.

## 2019-06-23 ENCOUNTER — Ambulatory Visit (INDEPENDENT_AMBULATORY_CARE_PROVIDER_SITE_OTHER): Payer: Medicare Other | Admitting: Internal Medicine

## 2019-06-23 ENCOUNTER — Other Ambulatory Visit: Payer: Self-pay

## 2019-06-23 ENCOUNTER — Encounter: Payer: Self-pay | Admitting: Internal Medicine

## 2019-06-23 VITALS — BP 155/78 | HR 74 | Temp 97.1°F | Ht 64.0 in | Wt 177.8 lb

## 2019-06-23 DIAGNOSIS — I4891 Unspecified atrial fibrillation: Secondary | ICD-10-CM

## 2019-06-23 DIAGNOSIS — Z951 Presence of aortocoronary bypass graft: Secondary | ICD-10-CM | POA: Diagnosis not present

## 2019-06-23 DIAGNOSIS — E782 Mixed hyperlipidemia: Secondary | ICD-10-CM

## 2019-06-23 DIAGNOSIS — I739 Peripheral vascular disease, unspecified: Secondary | ICD-10-CM | POA: Diagnosis not present

## 2019-06-23 MED ORDER — APIXABAN 5 MG PO TABS: 5.0000 mg | ORAL_TABLET | Freq: Two times a day (BID) | ORAL | 11 refills | Status: DC

## 2019-06-23 MED ORDER — LISINOPRIL 10 MG PO TABS: 10.0000 mg | ORAL_TABLET | Freq: Every day | ORAL | 3 refills | Status: DC

## 2019-06-23 NOTE — Patient Instructions (Signed)
Medication Instructions:  STOP xarelto START eliquis twice daily INCREASE lisinopril to 10mg  daily  *If you need a refill on your cardiac medications before your next appointment, please call your pharmacy*  Lab Work: NONE If you have labs (blood work) drawn today and your tests are completely normal, you will receive your results only by: Marland Kitchen MyChart Message (if you have MyChart) OR . A paper copy in the mail If you have any lab test that is abnormal or we need to change your treatment, we will call you to review the results.  Testing/Procedures: NONE  Follow-Up: At [facility], you and your health needs are our priority.  As part of our continuing mission to provide you with exceptional heart care, we have created designated Provider Care Teams.  These Care Teams include your primary Cardiologist (physician) and Advanced Practice Providers (APPs -  Physician Assistants and Nurse Practitioners) who all work together to provide you with the care you need, when you need it.  Your next appointment:   6 month(s)  The format for your next appointment:   Either In Person or Virtual  Provider:   You may see Pixie Casino, MD or one of the following Advanced Practice Providers on your designated Care Team:    Almyra Deforest, PA-C  Fabian Sharp, PA-C or   Roby Lofts, [place]   Other Instructions

## 2019-06-23 NOTE — Addendum Note (Signed)
Addended by: Pixie Casino on: 06/23/2019 05:51 PM   Modules accepted: Level of Service, SmartSet

## 2019-06-23 NOTE — Progress Notes (Addendum)
06/23/2019   PCP: Leighton Ruff, MD  CC: Follow-up afib  HPI: 78 year old white female with a history of coronary artery disease as well as peripheral vascular disease. She had bypass grafting in 1998 and stents in 2005 2006 and 2000 710 negative nuclear stress test in 2013 she also has peripheral arterial disease and history of aortic iliac bypass in 1991. Last ABIs were made of 2013 were normal.  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  Fortunately she was asymptomatic, she was placed on anticoagulation with the Xarelto.   She has done well since that time.  Her last visit with Dr. Debara Pickett was in February and he was planning to recheck d-dimer and if it was still elevated she would need to continue her Xarelto for total of 6 months.  Her d-dimer continues to be elevated at 0.67. Initial d-dimer in February was 1.48.  Venous Dopplers were also done in February which were negative for DVT.    Mrs. Doster just had a repeat D. dimer which was elevated at 1.48, actually slightly higher than it had been before. She tells a story that over the past several weeks she's had a urinary tract infection and started taking Bactrim for which she is more nauseated and is actually thrown up. She reports some tenderness across her epigastric area as well as the costovertebral angles. She's also had fever, chills and some sweating at night. She is planning on seeing her primary care doctor back in the office this afternoon. She denies any worsening shortness of breath or chest pain. He's had no bleeding complications on Xarelto.  Sheron returns today for follow-up. She recently reports some shortness of breath with exertion. She occasionally gets twinges in her chest however it is not reminiscent of her prior angina. Nevertheless her last stress test was 2 years ago. I think a lot of her  shortness of breath is due to weight gain and minimal exercise. She does water exercise but not water aerobics.  I saw Darcell Scarfone today in follow-up of her stress test and lower extremity Dopplers. Unfortunately she did not get the lower extremity Dopplers. She did undergo a metabolic stress testing. This showed no circulatory limitation to exercise. The main factors in her shortness of breath seems to be obesity and some pulmonary restriction. Currently she denies any significant leg claudication.  07/03/2106  Mrs. Bonis returns today for follow-up. Overall she's feeling fairly well. She had lower chimney arterial Dopplers a year ago which showed normal ABIs. She gets some occasional pain, burning and itching in her extremities which may be related to fibromyalgia. She denies any worsening chest pain or shortness of breath with exertion.  08/10/2017  Mrs. Imran was seen today in follow-up.  She says she is fairly stable with regards to her symptoms compared to last year.  She gets short of breath, when doing moderate to heavy exertion.  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  She had slight worsening of her right lower extremity ABI last year but denies any worsening claudication with exertion.  Blood pressures been well controlled at home.  She has a physical with routine blood work in 3 months.  08/17/2018  Mrs. Orchard returns today for routine follow-up.  Is been a year since I last  saw her.  She had an echocardiogram which showed a stable dilated aortic root.  We had planned to repeat CT angiography of the aorta around this time.  She also had had lower extremity Dopplers but denies any worsening claudication.  This showed mild bilateral lower extremity arterial disease.  She reports continued exercise and does do water aerobics.  She struggles with fibromyalgia and is on long-term opiate therapy.  Labs from March 2019 showed  total cholesterol 141, HDL 50, LDL 64 and triglycerides 134.  09/09/2018  Mrs. Mexicano is seen today as an add-on for new A. fib.  I just saw her a couple weeks ago.  At the time she was in sinus rhythm and doing well.  Today she was in for Dopplers and was noted to be in an irregular rhythm.  EKG was performed which shows newly identified atrial fibrillation with controlled ventricular response at 81.  Subsequently she reported that she has been having some episodes of palpitations on and off however it was not clear whether this could have been related to her fibromyalgia or any other disorder.  She is also had a few episodes of presyncope.  06/23/2019  Mrs. Bumgardner returns today for follow-up of A. fib.  Overall she is feeling well.  Her EKG today shows in sinus rhythm.  She has been tolerating Xarelto however the cost is concerning for her.  She is interested in something else.  We discussed possibly switching over to Eliquis.  Recently she held the [place] prior to colonoscopy which was uneventful and then it was restarted.  She is also noted elevated blood pressures recently.  She is only on low-dose lisinopril.    Allergies  Allergen Reactions  . Atenolol Other (See Comments)    cholitis  . Bactrim [Sulfamethoxazole-Trimethoprim] Nausea And Vomiting  . Cymbalta [Duloxetine Hcl] Other (See Comments)    Mad pt feel spacey  . Lyrica [Pregabalin] Other (See Comments)    Made pt feel spacey  . Penicillins Rash    Current Outpatient Medications  Medication Sig Dispense Refill  . b complex vitamins capsule Take 1 capsule by mouth daily.    . citalopram (CELEXA) 20 MG tablet Take 20 mg by mouth daily.     Marland Kitchen CRANBERRY PO Take by mouth daily.    Marland Kitchen ezetimibe (ZETIA) 10 MG tablet TAKE 1 TABLET BY MOUTH EVERY DAY 90 tablet 3  . fluorometholone (FML) 0.1 % ophthalmic suspension 1 drop every 4 (four) hours.    Marland Kitchen HYDROcodone-acetaminophen (NORCO/VICODIN) 5-325 MG tablet Take 1 tablet by mouth 4  (four) times daily as needed for moderate pain. 120 tablet 0  . lisinopril (ZESTRIL) 10 MG tablet Take 1 tablet (10 mg total) by mouth daily. 90 tablet 3  . methocarbamol (ROBAXIN) 500 MG tablet Take 1 tablet (500 mg total) by mouth every 8 (eight) hours as needed for muscle spasms. 30 tablet 4  . metoprolol succinate (TOPROL-XL) 25 MG 24 hr tablet TAKE 1/2 TABLET BY MOUTH DAILY. 45 tablet 10  . Multiple Vitamin (MULITIVITAMIN WITH MINERALS) TABS Take 1 tablet by mouth daily.      Marland Kitchen OVER THE COUNTER MEDICATION daily.    . simvastatin (ZOCOR) 40 MG tablet TAKE 1 TABLET BY MOUTH EVERY DAY AT 6PM 90 tablet 1  . tobramycin (TOBREX) 0.3 % ophthalmic solution every 4 (four) hours.    Marland Kitchen zolpidem (AMBIEN) 5 MG tablet TAKE 1 TABLET BY MOUTH 1-2 AT BEDTIME AS NEEDED **MUST LAST 30 DAYS**  2  . apixaban (ELIQUIS) 5 MG TABS tablet Take 1 tablet (5 mg total) by mouth 2 (two) times daily. 60 tablet 11  . diclofenac sodium (VOLTAREN) 1 % GEL Apply topically 4 (four) times daily.     No current facility-administered medications for this visit.     Past Medical History:  Diagnosis Date  . Abdominal aortic aneurysm ([place])   . Anemia, improved 01/11/2013  . Basal cell carcinoma   . CAD (coronary artery disease)    hx CABG 1998, last cath 2007 with PCI and stenting to the proximal segment of the vein graft to the diagonal branch. DES Taxus was placed  . Cervicalgia   . Chest wall pain   . Chronic fatigue fibromyalgia syndrome   . Chronic pain syndrome   . Coronary artery disease   . Depression   . Fibromyalgia   . Fibromyalgia   . History of nuclear stress test 08/07/2011   lexiscan; no evidence of ischemia or infarct; low risk   . Hyperlipidemia   . Hypertension   . Hypertension   . Obstructive sleep apnea   . OSA (obstructive sleep apnea)   . PAD (peripheral artery disease) ([place])   . Pes anserine bursitis   . Pulmonary embolism ([place]) 01/11/2013  . S/P resection of aortic aneurysm   . Trochanteric  bursitis   . Ulcerative colitis [facility])     Past Surgical History:  Procedure Laterality Date  . [place]  . CHOLECYSTECTOMY  1997  . CORONARY ANGIOPLASTY WITH STENT PLACEMENT  2006   to LAD, ATRETIC LIMA AT THAT TIME ALSO  . CORONARY ANGIOPLASTY WITH STENT PLACEMENT  2007   STENT TO PROX VG-DIAG, OTHER STENTS PATENT  . CORONARY ARTERY BYPASS GRAFT  1998   LIMA-LAD;VG-DIAG & RCA  . CORONARY STENT PLACEMENT  2005   TO LAD & LCX-STENTS,  . HERNIA REPAIR    . TUBAL LIGATION     BTL   EKG: Sinus rhythm with first-degree AV block at 68-personally reviewed  ROS: Pertinent items noted in HPI and remainder of comprehensive ROS otherwise negative.  PHYSICAL EXAM BP (!) 155/78   Pulse 74   Temp (!) 97.1 F (36.2 C)   Ht 5\' 4"  (1.626 m)   Wt 177 lb 12.8 oz (80.6 kg)   SpO2 99%   BMI 30.52 kg/m  General appearance: alert, no distress and moderately obese Neck: no carotid bruit and no JVD Lungs: clear to auscultation bilaterally Heart: irregularly irregular rhythm and systolic murmur: early systolic 2/6, blowing at 2nd right intercostal space Abdomen: soft, non-tender; bowel sounds normal; no masses,  no organomegaly Extremities: extremities normal, atraumatic, no cyanosis or edema Pulses: 2+ and symmetric Skin: Skin color, texture, turgor normal. No rashes or lesions Neurologic: Grossly normal Psych: Pleasant  ASSESSMENT AND PLAN Patient Active Problem List   Diagnosis Date Noted  . Atrial fibrillation ([place]) 09/09/2018  . DOE (dyspnea on exertion) 08/10/2017  . 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 Greater trochanteric bursitis of right hip 11/07/2014  . Sacro-iliac pain 11/07/2014  . Chronic midline low back pain without sciatica 11/07/2013  . Hepatitis 04/20/2013  . Screening for STD (sexually transmitted disease) 04/20/2013  . Recurrent UTI 04/20/2013  . Abdominal aortic aneurysm ([place])   . Ulcerative colitis ([place])   .  Obstructive sleep apnea   . Fibromyalgia   . Coronary artery disease   . Basal cell carcinoma   . Anemia, improved 01/11/2013  .  Ascending aortic aneurysm, 4.1 cm 01/11/2013  . 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 Chronic periscapular pain 10/15/2011  . Chest wall pain   . S/P resection of aortic aneurysm, 1998 prior to CABG   . PAD (peripheral artery disease), aortic-iliac bypass 1991, mild carotid bruits   . OSA (obstructive sleep apnea)   . Chronic fatigue fibromyalgia syndrome   . Hyperlipidemia   . Ulcerative colitis   . Depression   . CAD (coronary artery disease), CABG 1998, STENTS MULTIPLE SITES SINCE.    Marland Kitchen Hypertension    PLAN: Mrs. Zellman is paroxysmal atrial fibrillation.  She is in sinus rhythm today on Xarelto.  Cost has been an issue and therefore we will consider switching her to Eliquis 5 mg twice daily.  Blood pressure is also not ideally controlled.  We will increase her lisinopril up to 10 mg daily.  She should monitor this at home.  We may need to further increase her dose.  Follow-up in 6 months or sooner as necessary.  Pixie Casino, MD, [facility], [place] Director of the [facility]  Card[facility] Diplomate of the American Board of Clinical Lipidology Attending Cardiologist  Direct Dial: [phone]  Fax: [phone]  Website:  [email or web address]

## 2019-07-05 NOTE — Progress Notes (Signed)
Virtual Visit via Telephone Note  I connected with Kara Bush on 07/06/19 at  3:15 PM EST by a video enabled telemedicine application and verified that I am speaking with the correct person using two identifiers.  Location: Patient: Home  Provider: Clinic  This service was conducted via virtual visit.   The patient was located at home. I was located in my office.  Consent was obtained prior to the virtual visit and is aware of possible charges through their insurance for this visit.  The patient is an established patient.  Dr. Estanislado Pandy, MD conducted the virtual visit and Hazel Sams, PA-C acted as scribe during the service.  Office staff helped with scheduling follow up visits after the service was conducted.   I discussed the limitations of evaluation and management by telemedicine and the availability of in person appointments. The patient expressed understanding and agreed to proceed.  CC: Left foot pain  History of Present Illness: Patient is a 78 year old female with a past medical history of fibromyalgia and osteoarthritis. She states at the end of August she developed left foot pain after her dog stepped on her foot.  She states the pain is an aching sensation and occasionally sharp.  She had a x-ray performed at [facility] imaging ordered by her pain management specialist.  She states the x-ray was normal on 06/09/19.  She denies any redness or swelling.  She is having tenderness at the base of the 5th metatarsal.   Review of Systems  Constitutional: Positive for malaise/fatigue. Negative for fever.  Eyes: Negative for photophobia, pain, discharge and redness.  Respiratory: Negative for cough, shortness of breath and wheezing.   Cardiovascular: Negative for chest pain and palpitations.  Gastrointestinal: Negative for blood in stool, constipation and diarrhea.  Genitourinary: Negative for dysuria.  Musculoskeletal: Positive for joint pain. Negative for back pain, myalgias and  neck pain.  Skin: Negative for rash.  Neurological: Negative for dizziness and headaches.  Psychiatric/Behavioral: Negative for depression. The patient has insomnia. The patient is not nervous/anxious.       Observations/Objective: Physical Exam  Constitutional: She is oriented to person, place, and time and well-developed, well-nourished, and in no distress.  HENT:  Head: Normocephalic and atraumatic.  Eyes: Conjunctivae are normal.  Pulmonary/Chest: Effort normal.  Neurological: She is alert and oriented to person, place, and time.  Psychiatric: Mood, memory, affect and judgment normal.   Patient reports morning stiffness for  5-10  minutes.   Patient denies nocturnal pain.  Difficulty dressing/grooming: Denies Difficulty climbing stairs: Denies Difficulty getting out of chair: Denies Difficulty using hands for taps, buttons, cutlery, and/or writing: Denies   Assessment and Plan: Visit Diagnoses: Fibromyalgia: Her fibromyalgia pain has been manageable.  She has not been experiencing frequent or severe flares recently.  She takes hydrocodone as needed for pain relief.  She goes to a pain management clinic. She has chronic fatigue related to insomnia and continues to take ambien 10 mg po at bedtime. She was encouraged to exercise on a regular basis.  She will follow up in 6 months.   Trochanteric bursitis of both hips: She has no discomfort at this time.   Pain in left foot: She has been experiencing pain at the base of the left 5th metatarsal since the end of August.  She states she noticed the discomfort start after her dog stepped on her foot.  She has not noticed any joint swelling or redness.  She was evaluated by her pain  management specialist who ordered x-rays which were unremarkable. She has spurring at the base of the left metatarsal evident on the x-rays reviewed from 06/09/19. We discussed the importance of wearing proper fitting shoes with cushion to avoid walking barefoot.    Primary insomnia - She takes Ambien 10 mg at bedtime for insomnia.   Cervical spondylolysis: She has chronic neck pain and stiffness.  She is followed by pain management.   Primary osteoarthritis of both hands: She has intermittent stiffness in both hands.  No joint swelling.   Sacro-iliac pain: She has no SI joint pain at this time.   Iliotibial band syndrome both sides: She has not had any discomfort recently.   Lateral epicondylitis, right elbow: She has persistent discomfort. She uses Arnica topically as needed. She can try using a tennis elbow brace. We discussed the importance of performing stretching exercises.  Other medical conditions are listed as follows:  History of depression  History of ulcerative colitis  History of hypertension  History of hyperlipidemia  History of coronary artery disease  OSA (obstructive sleep apnea)  Coronary artery disease status post CABG  History of sleep apnea    Follow Up Instructions: She will follow up in 6 months.    I discussed the assessment and treatment plan with the patient. The patient was provided an opportunity to ask questions and all were answered. The patient agreed with the plan and demonstrated an understanding of the instructions.   The patient was advised to call back or seek an in-person evaluation if the symptoms worsen or if the condition fails to improve as anticipated.  I provided 15 minutes of non-face-to-face time during this encounter.   Bo Merino, MD   Scribed by-  Hazel Sams, PA-C

## 2019-07-06 ENCOUNTER — Encounter: Payer: Self-pay | Admitting: Rheumatology

## 2019-07-06 ENCOUNTER — Telehealth (INDEPENDENT_AMBULATORY_CARE_PROVIDER_SITE_OTHER): Payer: Medicare Other | Admitting: Rheumatology

## 2019-07-06 ENCOUNTER — Other Ambulatory Visit: Payer: Self-pay

## 2019-07-06 DIAGNOSIS — Z8659 Personal history of other mental and behavioral disorders: Secondary | ICD-10-CM

## 2019-07-06 DIAGNOSIS — Z8639 Personal history of other endocrine, nutritional and metabolic disease: Secondary | ICD-10-CM

## 2019-07-06 DIAGNOSIS — M79672 Pain in left foot: Secondary | ICD-10-CM

## 2019-07-06 DIAGNOSIS — M7061 Trochanteric bursitis, right hip: Secondary | ICD-10-CM | POA: Diagnosis not present

## 2019-07-06 DIAGNOSIS — M533 Sacrococcygeal disorders, not elsewhere classified: Secondary | ICD-10-CM

## 2019-07-06 DIAGNOSIS — Z8719 Personal history of other diseases of the digestive system: Secondary | ICD-10-CM | POA: Diagnosis not present

## 2019-07-06 DIAGNOSIS — M4302 Spondylolysis, cervical region: Secondary | ICD-10-CM | POA: Diagnosis not present

## 2019-07-06 DIAGNOSIS — Z8679 Personal history of other diseases of the circulatory system: Secondary | ICD-10-CM

## 2019-07-06 DIAGNOSIS — G4733 Obstructive sleep apnea (adult) (pediatric): Secondary | ICD-10-CM

## 2019-07-06 DIAGNOSIS — I2510 Atherosclerotic heart disease of native coronary artery without angina pectoris: Secondary | ICD-10-CM | POA: Diagnosis not present

## 2019-07-06 DIAGNOSIS — F5101 Primary insomnia: Secondary | ICD-10-CM

## 2019-07-06 DIAGNOSIS — M19041 Primary osteoarthritis, right hand: Secondary | ICD-10-CM | POA: Diagnosis not present

## 2019-07-06 DIAGNOSIS — M797 Fibromyalgia: Secondary | ICD-10-CM | POA: Diagnosis not present

## 2019-07-06 DIAGNOSIS — M7062 Trochanteric bursitis, left hip: Secondary | ICD-10-CM

## 2019-07-06 DIAGNOSIS — M19042 Primary osteoarthritis, left hand: Secondary | ICD-10-CM

## 2019-07-14 DIAGNOSIS — H02889 Meibomian gland dysfunction of unspecified eye, unspecified eyelid: Secondary | ICD-10-CM | POA: Diagnosis not present

## 2019-07-14 DIAGNOSIS — Z961 Presence of intraocular lens: Secondary | ICD-10-CM | POA: Diagnosis not present

## 2019-07-14 DIAGNOSIS — H2512 Age-related nuclear cataract, left eye: Secondary | ICD-10-CM | POA: Diagnosis not present

## 2019-07-15 DIAGNOSIS — Z23 Encounter for immunization: Secondary | ICD-10-CM | POA: Diagnosis not present

## 2019-08-11 ENCOUNTER — Encounter: Payer: Medicare Other | Attending: Physical Medicine and Rehabilitation | Admitting: Registered Nurse

## 2019-08-11 ENCOUNTER — Encounter: Payer: Self-pay | Admitting: Registered Nurse

## 2019-08-11 ENCOUNTER — Other Ambulatory Visit: Payer: Self-pay

## 2019-08-11 VITALS — Ht 64.0 in | Wt 177.0 lb

## 2019-08-11 DIAGNOSIS — G8929 Other chronic pain: Secondary | ICD-10-CM

## 2019-08-11 DIAGNOSIS — M25521 Pain in right elbow: Secondary | ICD-10-CM | POA: Diagnosis not present

## 2019-08-11 DIAGNOSIS — M79672 Pain in left foot: Secondary | ICD-10-CM

## 2019-08-11 DIAGNOSIS — Z87891 Personal history of nicotine dependence: Secondary | ICD-10-CM | POA: Diagnosis not present

## 2019-08-11 DIAGNOSIS — M545 Low back pain: Secondary | ICD-10-CM | POA: Diagnosis not present

## 2019-08-11 DIAGNOSIS — M797 Fibromyalgia: Secondary | ICD-10-CM

## 2019-08-11 DIAGNOSIS — Z79891 Long term (current) use of opiate analgesic: Secondary | ICD-10-CM | POA: Diagnosis not present

## 2019-08-11 DIAGNOSIS — Z5181 Encounter for therapeutic drug level monitoring: Secondary | ICD-10-CM | POA: Diagnosis not present

## 2019-08-11 DIAGNOSIS — Z79899 Other long term (current) drug therapy: Secondary | ICD-10-CM | POA: Insufficient documentation

## 2019-08-11 DIAGNOSIS — R5382 Chronic fatigue, unspecified: Secondary | ICD-10-CM | POA: Insufficient documentation

## 2019-08-11 DIAGNOSIS — M4302 Spondylolysis, cervical region: Secondary | ICD-10-CM | POA: Insufficient documentation

## 2019-08-11 MED ORDER — HYDROCODONE-ACETAMINOPHEN 5-325 MG PO TABS: 1.0000 | ORAL_TABLET | Freq: Four times a day (QID) | ORAL | 0 refills | Status: DC | PRN

## 2019-08-11 NOTE — Progress Notes (Addendum)
Subjective:    Patient ID: Kara Bush, female    DOB: 1940-12-10, 79 y.o.   MRN: [id number]  HPI: AAILA GRISSO is a 79 y.o. female whose appointment was changed to a virtual office visit to reduce the risk of exposure to the COVID-19 virus and to help Ms. Marland  remain healthy and safe. The virtual visit will also provide continuity of care. Ms. Micco agrees to the tele-health visit and verbalizes understanding. She states her  pain is located in her right elbow, mid-back mainly right side, left knee and left foot. She rates her pain 4. Her current exercise regime is walking.  Ms. Rowton Morphine equivalent is 20.00 MME.  Last Oral Swab was Performed on 04/12/2019 it was consistent.   Wenda Overland RN asked the Health and History Questions. This provider and Wenda Overland verified we were speaking with the correct person using two identifiers.   Pain Inventory Average Pain 4 Pain Right Now 4 My pain is constant and aching  In the last 24 hours, has pain interfered with the following? General activity 4 Relation with others 1 Enjoyment of life 5 What TIME of day is your pain at its worst? morning Sleep (in general) Good  Pain is worse with: bending and some activites Pain improves with: rest, pacing activities and medication Relief from Meds: 7  Mobility walk without assistance ability to climb steps?  yes do you drive?  yes  Function retired  Neuro/Psych No problems in this area  Prior Studies Any changes since last visit?  no  Physicians involved in your care Any changes since last visit?  no   Family History  Problem Relation Age of Onset  . Heart disease Father   . Heart attack Father        @ 32  . Cancer Paternal Aunt        BREAST  . Heart failure Mother        at 48  . Healthy Sister   . Cancer Brother        pancreatic   . Heart failure Maternal Grandmother   . Heart attack Maternal Grandfather   . Heart attack Paternal  Grandmother        at 6  . Healthy Daughter   . Healthy Daughter    Social History   Socioeconomic History  . Marital status: Divorced    Spouse name: Not on file  . Number of children: 2  . Years of education: college   . Highest education level: Not on file  Occupational History    Employer: RETIRED  Tobacco Use  . Smoking status: Former Smoker    Packs/day: 2.50    Years: 20.00    Pack years: 50.00    Types: Cigarettes    Quit date: 06/29/1990    Years since quitting: 29.1  . Smokeless tobacco: Never Used  Substance and Sexual Activity  . Alcohol use: No  . Drug use: No  . Sexual activity: Not on file  Other Topics Concern  . Not on file  Social History Narrative  . Not on file   Social Determinants of Health   Financial Resource Strain:   . Difficulty of Paying Living Expenses: Not on file  Food Insecurity:   . Worried About Charity fundraiser in the Last Year: Not on file  . Ran Out of Food in the Last Year: Not on file  Transportation Needs:   . Lack of Transportation (Medical):  Not on file  . Lack of Transportation (Non-Medical): Not on file  Physical Activity:   . Days of Exercise per Week: Not on file  . Minutes of Exercise per Session: Not on file  Stress:   . Feeling of Stress : Not on file  Social Connections:   . Frequency of Communication with Friends and Family: Not on file  . Frequency of Social Gatherings with Friends and Family: Not on file  . Attends Religious Services: Not on file  . Active Member of Clubs or Organizations: Not on file  . Attends Archivist Meetings: Not on file  . Marital Status: Not on file   Past Surgical History:  Procedure Laterality Date  . [place]  . CHOLECYSTECTOMY  1997  . CORONARY ANGIOPLASTY WITH STENT PLACEMENT  2006   to LAD, ATRETIC LIMA AT THAT TIME ALSO  . CORONARY ANGIOPLASTY WITH STENT PLACEMENT  2007   STENT TO PROX VG-DIAG, OTHER STENTS PATENT  . CORONARY  ARTERY BYPASS GRAFT  1998   LIMA-LAD;VG-DIAG & RCA  . CORONARY STENT PLACEMENT  2005   TO LAD & LCX-STENTS,  . HERNIA REPAIR    . TUBAL LIGATION     BTL   Past Medical History:  Diagnosis Date  . Abdominal aortic aneurysm ([place])   . Anemia, improved 01/11/2013  . Basal cell carcinoma   . CAD (coronary artery disease)    hx CABG 1998, last cath 2007 with PCI and stenting to the proximal segment of the vein graft to the diagonal branch. DES Taxus was placed  . Cervicalgia   . Chest wall pain   . Chronic fatigue fibromyalgia syndrome   . Chronic pain syndrome   . Coronary artery disease   . Depression   . Fibromyalgia   . Fibromyalgia   . History of nuclear stress test 08/07/2011   lexiscan; no evidence of ischemia or infarct; low risk   . Hyperlipidemia   . Hypertension   . Hypertension   . Obstructive sleep apnea   . OSA (obstructive sleep apnea)   . PAD (peripheral artery disease) ([place])   . Pes anserine bursitis   . Pulmonary embolism ([name]) 01/11/2013  . S/P resection of aortic aneurysm   . Trochanteric bursitis   . Ulcerative colitis ([place])    Ht 5\' 4"  (1.626 m) Comment: reported  Wt 177 lb (80.3 kg) Comment: reported  BMI 30.38 kg/m   Opioid Risk Score:   Fall Risk Score:  `1  Depression screen PHQ 2/9  Depression screen [facility] 2/9 08/11/2019 04/13/2018 02/12/2018 12/11/2017 08/18/2017 08/21/2015 11/07/2014  Decreased Interest 0 0 0 0 0 0 0  Down, Depressed, Hopeless 0 0 0 0 0 0 0  PHQ - 2 Score 0 0 0 0 0 0 0  Tired, decreased energy - - - - - - 1  Change in appetite - - - - - - 1  Feeling bad or failure about yourself  - - - - - - 0  Trouble concentrating - - - - - - 0  Moving slowly or fidgety/restless - - - - - - 0  Suicidal thoughts - - - - - - 0    Review of Systems  Constitutional: Negative.   HENT: Negative.   Eyes: Negative.   Respiratory: Negative.   Cardiovascular: Negative.   Gastrointestinal: Negative.   Endocrine: Negative.   Genitourinary: Negative.    Musculoskeletal: Positive for arthralgias and myalgias.  Skin: Negative.  Allergic/Immunologic: Negative.   Neurological: Negative.   Hematological: Bruises/bleeds easily.       Eliquis  Psychiatric/Behavioral: Negative.   All other systems reviewed and are negative.      Objective:   Physical Exam Vitals and nursing note reviewed.  Musculoskeletal:     Comments: No Physical Exam Performed: Virtual Visit           Assessment & Plan:  1.Cervical spondylosis, cervicalgia with radiation to right scapular region: Continue to Monitor, Continue Current Medication and exercise regime.08/11/2019 2. Fibromyalgia: Continue Pool therapy once pool opensand Continue  Home Exercise Program.08/11/2019 3. Bilateral intermittent hand numbness: Carpal tunnel syndrome versus C6 radiculopathy mild:No complaints today.Continue to Monitor.08/11/2019 4.Chronic Midline Low Back Pain/LBP: Refilled: Norco 5/325mg  #120pills--use one pill every 6 hours as neededfor pain. A second prescription wase-scribefor the followingmonth.08/11/2019. We will continue the opioid monitoring program, this consists of regular clinic visits, examinations, urine drug screen, pill counts as well as use of [place] Controlled Substance Reporting System.  5. Muscle Spasm: Continuecurrent medication regimen withRobaxin.08/11/2019 6.Bilateral Greater Trochanteric Bursitis: No complaints today.Continue to Alternate with heat and ice therapy. Continue current medication regime.08/11/2019 7. Left Foot pain: Continue with HEP as Tolerated. Continue to Monitor. 08/11/2019 8. Right elbow Pain: Continue HEP as Tolerated. Continue to Monitor. 08/11/2019  F/U in 2 months  Tele-Health Visit Telephone Call Established Patient Location of patient: In her Home Location of Provider in Office Time Spent: 10 Minutes

## 2019-08-17 ENCOUNTER — Other Ambulatory Visit: Payer: Self-pay

## 2019-08-17 ENCOUNTER — Ambulatory Visit (INDEPENDENT_AMBULATORY_CARE_PROVIDER_SITE_OTHER): Payer: Medicare Other | Admitting: Internal Medicine

## 2019-08-17 ENCOUNTER — Encounter: Payer: Self-pay | Admitting: Internal Medicine

## 2019-08-17 VITALS — BP 153/71 | HR 80 | Ht 64.0 in | Wt 177.8 lb

## 2019-08-17 DIAGNOSIS — Z79899 Other long term (current) drug therapy: Secondary | ICD-10-CM | POA: Diagnosis not present

## 2019-08-17 DIAGNOSIS — I10 Essential (primary) hypertension: Secondary | ICD-10-CM | POA: Diagnosis not present

## 2019-08-17 DIAGNOSIS — E782 Mixed hyperlipidemia: Secondary | ICD-10-CM | POA: Diagnosis not present

## 2019-08-17 DIAGNOSIS — I739 Peripheral vascular disease, unspecified: Secondary | ICD-10-CM

## 2019-08-17 DIAGNOSIS — Z951 Presence of aortocoronary bypass graft: Secondary | ICD-10-CM

## 2019-08-17 DIAGNOSIS — I4891 Unspecified atrial fibrillation: Secondary | ICD-10-CM | POA: Diagnosis not present

## 2019-08-17 MED ORDER — VALSARTAN 80 MG PO TABS: 80.0000 mg | ORAL_TABLET | Freq: Every day | ORAL | 3 refills | Status: DC

## 2019-08-17 NOTE — Progress Notes (Signed)
08/17/2019   PCP: Leighton Ruff, MD  CC: Follow-up afib  HPI: 79 year old white female with a history of coronary artery disease as well as peripheral vascular disease. She had bypass grafting in 1998 and stents in 2005 2006 and 2000 710 negative nuclear stress test in 2013 she also has peripheral arterial disease and history of aortic iliac bypass in 1991. Last ABIs were made of 2013 were normal.  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  Fortunately she was asymptomatic, she was placed on anticoagulation with the Xarelto.   She has done well since that time.  Her last visit with Dr. Debara Pickett was in February and he was planning to recheck d-dimer and if it was still elevated she would need to continue her Xarelto for total of 6 months.  Her d-dimer continues to be elevated at 0.67. Initial d-dimer in February was 1.48.  Venous Dopplers were also done in February which were negative for DVT.    Kara Bush just had a repeat D. dimer which was elevated at 1.48, actually slightly higher than it had been before. She tells a story that over the past several weeks she's had a urinary tract infection and started taking Bactrim for which she is more nauseated and is actually thrown up. She reports some tenderness across her epigastric area as well as the costovertebral angles. She's also had fever, chills and some sweating at night. She is planning on seeing her primary care doctor back in the office this afternoon. She denies any worsening shortness of breath or chest pain. He's had no bleeding complications on Xarelto.  Kara Bush returns today for follow-up. She recently reports some shortness of breath with exertion. She occasionally gets twinges in her chest however it is not reminiscent of her prior angina. Nevertheless her last stress test was 2 years ago. I think a lot of her  shortness of breath is due to weight gain and minimal exercise. She does water exercise but not water aerobics.  I saw Kara Bush today in follow-up of her stress test and lower extremity Dopplers. Unfortunately she did not get the lower extremity Dopplers. She did undergo a metabolic stress testing. This showed no circulatory limitation to exercise. The main factors in her shortness of breath seems to be obesity and some pulmonary restriction. Currently she denies any significant leg claudication.  07/03/2106  Kara Bush returns today for follow-up. Overall she's feeling fairly well. She had lower chimney arterial Dopplers a year ago which showed normal ABIs. She gets some occasional pain, burning and itching in her extremities which may be related to fibromyalgia. She denies any worsening chest pain or shortness of breath with exertion.  08/10/2017  Kara Bush was seen today in follow-up.  She says she is fairly stable with regards to her symptoms compared to last year.  She gets short of breath, when doing moderate to heavy exertion.  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  She had slight worsening of her right lower extremity ABI last year but denies any worsening claudication with exertion.  Blood pressures been well controlled at home.  She has a physical with routine blood work in 3 months.  08/17/2018  Kara Bush returns today for routine follow-up.  Is been a year since I last  saw her.  She had an echocardiogram which showed a stable dilated aortic root.  We had planned to repeat CT angiography of the aorta around this time.  She also had had lower extremity Dopplers but denies any worsening claudication.  This showed mild bilateral lower extremity arterial disease.  She reports continued exercise and does do water aerobics.  She struggles with fibromyalgia and is on long-term opiate therapy.  Labs from March 2019 showed  total cholesterol 141, HDL 50, LDL 64 and triglycerides 134.  09/09/2018  Kara Bush is seen today as an add-on for new A. fib.  I just saw her a couple weeks ago.  At the time she was in sinus rhythm and doing well.  Today she was in for Dopplers and was noted to be in an irregular rhythm.  EKG was performed which shows newly identified atrial fibrillation with controlled ventricular response at 79.  Subsequently she reported that she has been having some episodes of palpitations on and off however it was not clear whether this could have been related to her fibromyalgia or any other disorder.  She is also had a few episodes of presyncope.  06/23/2019  Kara Bush returns today for follow-up of A. fib.  Overall she is feeling well.  Her EKG today shows in sinus rhythm.  She has been tolerating Xarelto however the cost is concerning for her.  She is interested in something else.  We discussed possibly switching over to Eliquis.  Recently she held the [place] prior to colonoscopy which was uneventful and then it was restarted.  She is also noted elevated blood pressures recently.  She is only on low-dose lisinopril.  08/17/2019  Kara Bush is seen today in follow-up.  I last saw her about a couple months ago.  She returns today for follow-up of blood pressure.  I previously increased her lisinopril however she remains hypertensive.  She is done well after transitioning from Xarelto to Eliquis.  She is currently only on low-dose lisinopril 10 mg daily.    Allergies  Allergen Reactions  . Atenolol Other (See Comments)    cholitis  . Bactrim [Sulfamethoxazole-Trimethoprim] Nausea And Vomiting  . Cymbalta [Duloxetine Hcl] Other (See Comments)    Mad pt feel spacey  . Lyrica [Pregabalin] Other (See Comments)    Made pt feel spacey  . Penicillins Rash    Current Outpatient Medications  Medication Sig Dispense Refill  . apixaban (ELIQUIS) 5 MG TABS tablet Take 1 tablet (5 mg total) by mouth 2  (two) times daily. 60 tablet 11  . b complex vitamins capsule Take 1 capsule by mouth daily.    . citalopram (CELEXA) 20 MG tablet Take 20 mg by mouth daily.     . diclofenac sodium (VOLTAREN) 1 % GEL Apply topically 4 (four) times daily.    Marland Kitchen ezetimibe (ZETIA) 10 MG tablet TAKE 1 TABLET BY MOUTH EVERY DAY 90 tablet 3  . fluorometholone (FML) 0.1 % ophthalmic suspension 1 drop every 4 (four) hours.    Marland Kitchen HYDROcodone-acetaminophen (NORCO/VICODIN) 5-325 MG tablet Take 1 tablet by mouth 4 (four) times daily as needed for moderate pain. 120 tablet 0  . lisinopril (ZESTRIL) 10 MG tablet Take 1 tablet (10 mg total) by mouth daily. 90 tablet 3  . methocarbamol (ROBAXIN) 500 MG tablet Take 1 tablet (500 mg total) by mouth every 8 (eight) hours as needed for muscle spasms. 30 tablet 4  . metoprolol succinate (TOPROL-XL) 25 MG 24 hr tablet TAKE  1/2 TABLET BY MOUTH DAILY. 45 tablet 10  . Multiple Vitamin (MULITIVITAMIN WITH MINERALS) TABS Take 1 tablet by mouth daily.      Marland Kitchen OVER THE COUNTER MEDICATION daily.    . simvastatin (ZOCOR) 40 MG tablet TAKE 1 TABLET BY MOUTH EVERY DAY AT 6PM 90 tablet 1  . tobramycin (TOBREX) 0.3 % ophthalmic solution every 4 (four) hours.    Marland Kitchen zolpidem (AMBIEN) 5 MG tablet TAKE 1 TABLET BY MOUTH 1-2 AT BEDTIME AS NEEDED **MUST LAST 30 DAYS**  2   No current facility-administered medications for this visit.    Past Medical History:  Diagnosis Date  . Abdominal aortic aneurysm ([place])   . Anemia, improved 01/11/2013  . Basal cell carcinoma   . CAD (coronary artery disease)    hx CABG 1998, last cath 2007 with PCI and stenting to the proximal segment of the vein graft to the diagonal branch. DES Taxus was placed  . Cervicalgia   . Chest wall pain   . Chronic fatigue fibromyalgia syndrome   . Chronic pain syndrome   . Coronary artery disease   . Depression   . Fibromyalgia   . Fibromyalgia   . History of nuclear stress test 08/07/2011   lexiscan; no evidence of ischemia or  infarct; low risk   . Hyperlipidemia   . Hypertension   . Hypertension   . Obstructive sleep apnea   . OSA (obstructive sleep apnea)   . PAD (peripheral artery disease) ([place])   . Pes anserine bursitis   . Pulmonary embolism ([place]) 01/11/2013  . S/P resection of aortic aneurysm   . Trochanteric bursitis   . Ulcerative colitis [facility])     Past Surgical History:  Procedure Laterality Date  . [place]  . CHOLECYSTECTOMY  1997  . CORONARY ANGIOPLASTY WITH STENT PLACEMENT  2006   to LAD, ATRETIC LIMA AT THAT TIME ALSO  . CORONARY ANGIOPLASTY WITH STENT PLACEMENT  2007   STENT TO PROX VG-DIAG, OTHER STENTS PATENT  . CORONARY ARTERY BYPASS GRAFT  1998   LIMA-LAD;VG-DIAG & RCA  . CORONARY STENT PLACEMENT  2005   TO LAD & LCX-STENTS,  . HERNIA REPAIR    . TUBAL LIGATION     BTL   EKG: Sinus rhythm with first-degree AV block and PAC's at 80-personally reviewed  ROS: Pertinent items noted in HPI and remainder of comprehensive ROS otherwise negative.  PHYSICAL EXAM BP (!) 153/71   Pulse 80   Ht 5\' 4"  (1.626 m)   Wt 177 lb 12.8 oz (80.6 kg)   SpO2 97%   BMI 30.52 kg/m  General appearance: alert, no distress and moderately obese Neck: no carotid bruit and no JVD Lungs: clear to auscultation bilaterally Heart: irregularly irregular rhythm and systolic murmur: early systolic 2/6, blowing at 2nd right intercostal space Abdomen: soft, non-tender; bowel sounds normal; no masses,  no organomegaly Extremities: extremities normal, atraumatic, no cyanosis or edema Pulses: 2+ and symmetric Skin: Skin color, texture, turgor normal. No rashes or lesions Neurologic: Grossly normal Psych: Pleasant  ASSESSMENT AND PLAN Patient Active Problem List   Diagnosis Date Noted  . Atrial fibrillation ([place]) 09/09/2018  . DOE (dyspnea on exertion) 08/10/2017  . 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 Greater trochanteric bursitis of right hip 11/07/2014  .  Sacro-iliac pain 11/07/2014  . Chronic midline low back pain without sciatica 11/07/2013  . Hepatitis 04/20/2013  . Screening for STD (sexually transmitted disease) 04/20/2013  .  Recurrent UTI 04/20/2013  . Abdominal aortic aneurysm ([place])   . Ulcerative colitis ([place])   . Obstructive sleep apnea   . Fibromyalgia   . Coronary artery disease   . Basal cell carcinoma   . Anemia, improved 01/11/2013  . Ascending aortic aneurysm, 4.1 cm 01/11/2013  . 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 Chronic periscapular pain 10/15/2011  . Chest wall pain   . S/P resection of aortic aneurysm, 1998 prior to CABG   . PAD (peripheral artery disease), aortic-iliac bypass 1991, mild carotid bruits   . OSA (obstructive sleep apnea)   . Chronic fatigue fibromyalgia syndrome   . Hyperlipidemia   . Ulcerative colitis   . Depression   . CAD (coronary artery disease), CABG 1998, STENTS MULTIPLE SITES SINCE.    Marland Kitchen Hypertension    PLAN: Kara Bush is in a sinus rhythm today.  She denies any palpitations or chest pain.  Her blood pressure remains elevated.  We will further increase her lisinopril up to 20 mg daily.  We will repeat lab work.  Follow-up in 6 months or sooner as necessary.  Pixie Casino, MD, [facility], [place] Director of the Advanced Lipid Disorders &  Cardiovascular Risk Reduction Clinic Diplomate of the American Board of Clinical Lipidology Attending Cardiologist  Direct Dial: [phone]  Fax: [phone]  Website:  [email or web address]

## 2019-08-17 NOTE — Patient Instructions (Signed)
Medication Instructions:  STOP lisinopril START valsartan 80mg  daily Continue other current medication  *If you need a refill on your cardiac medications before your next appointment, please call your pharmacy*  Lab Work: FASTING lipid panel & CMET  If you have labs (blood work) drawn today and your tests are completely normal, you will receive your results only by: Marland Kitchen MyChart Message (if you have MyChart) OR . A paper copy in the mail If you have any lab test that is abnormal or we need to change your treatment, we will call you to review the results.  Follow-Up: At [facility], you and your health needs are our priority.  As part of our continuing mission to provide you with exceptional heart care, we have created designated Provider Care Teams.  These Care Teams include your primary Cardiologist (physician) and Advanced Practice Providers (APPs -  Physician Assistants and Nurse Practitioners) who all work together to provide you with the care you need, when you need it.  Your next appointment:   6 months  The format for your next appointment:   In Person  Provider:   K. Mali Hilty, MD  Other Instructions

## 2019-08-19 DIAGNOSIS — I10 Essential (primary) hypertension: Secondary | ICD-10-CM | POA: Diagnosis not present

## 2019-08-19 DIAGNOSIS — E7800 Pure hypercholesterolemia, unspecified: Secondary | ICD-10-CM | POA: Diagnosis not present

## 2019-08-19 DIAGNOSIS — I4891 Unspecified atrial fibrillation: Secondary | ICD-10-CM | POA: Diagnosis not present

## 2019-08-19 DIAGNOSIS — M1990 Unspecified osteoarthritis, unspecified site: Secondary | ICD-10-CM | POA: Diagnosis not present

## 2019-08-19 DIAGNOSIS — I2510 Atherosclerotic heart disease of native coronary artery without angina pectoris: Secondary | ICD-10-CM | POA: Diagnosis not present

## 2019-08-19 DIAGNOSIS — F339 Major depressive disorder, recurrent, unspecified: Secondary | ICD-10-CM | POA: Diagnosis not present

## 2019-09-01 ENCOUNTER — Telehealth: Payer: Self-pay | Admitting: Internal Medicine

## 2019-09-01 NOTE — Telephone Encounter (Signed)
Left message for patient to call and schedule CTA chest aorta ordered by Dr. Debara Pickett

## 2019-09-05 HISTORY — PX: CATARACT EXTRACTION: SUR2

## 2019-09-09 ENCOUNTER — Ambulatory Visit
Admission: RE | Admit: 2019-09-09 | Discharge: 2019-09-09 | Disposition: A | Discharge status: Home / Self Care | Payer: Medicare Other | Source: Ambulatory Visit | Attending: Internal Medicine | Admitting: Internal Medicine

## 2019-09-09 DIAGNOSIS — I712 Thoracic aortic aneurysm, without rupture, unspecified: Secondary | ICD-10-CM

## 2019-09-09 DIAGNOSIS — I7121 Aneurysm of the ascending aorta, without rupture: Secondary | ICD-10-CM

## 2019-09-09 MED ORDER — IOPAMIDOL (ISOVUE-370) INJECTION 76%
75.0000 mL | Freq: Once | INTRAVENOUS | Status: AC | PRN
  Administered 2019-09-09: 75 mL via INTRAVENOUS

## 2019-09-12 ENCOUNTER — Other Ambulatory Visit: Payer: Self-pay | Admitting: Internal Medicine

## 2019-09-12 DIAGNOSIS — I712 Thoracic aortic aneurysm, without rupture, unspecified: Secondary | ICD-10-CM

## 2019-09-12 DIAGNOSIS — I7121 Aneurysm of the ascending aorta, without rupture: Secondary | ICD-10-CM

## 2019-09-19 DIAGNOSIS — H02883 Meibomian gland dysfunction of right eye, unspecified eyelid: Secondary | ICD-10-CM | POA: Diagnosis not present

## 2019-09-19 DIAGNOSIS — Z961 Presence of intraocular lens: Secondary | ICD-10-CM | POA: Diagnosis not present

## 2019-09-19 DIAGNOSIS — H2512 Age-related nuclear cataract, left eye: Secondary | ICD-10-CM | POA: Diagnosis not present

## 2019-09-28 DIAGNOSIS — I10 Essential (primary) hypertension: Secondary | ICD-10-CM | POA: Diagnosis not present

## 2019-09-28 DIAGNOSIS — M1990 Unspecified osteoarthritis, unspecified site: Secondary | ICD-10-CM | POA: Diagnosis not present

## 2019-09-28 DIAGNOSIS — E7800 Pure hypercholesterolemia, unspecified: Secondary | ICD-10-CM | POA: Diagnosis not present

## 2019-09-28 DIAGNOSIS — I4891 Unspecified atrial fibrillation: Secondary | ICD-10-CM | POA: Diagnosis not present

## 2019-09-28 DIAGNOSIS — I2510 Atherosclerotic heart disease of native coronary artery without angina pectoris: Secondary | ICD-10-CM | POA: Diagnosis not present

## 2019-09-28 DIAGNOSIS — F339 Major depressive disorder, recurrent, unspecified: Secondary | ICD-10-CM | POA: Diagnosis not present

## 2019-09-29 DIAGNOSIS — Z961 Presence of intraocular lens: Secondary | ICD-10-CM | POA: Diagnosis not present

## 2019-09-29 DIAGNOSIS — H18413 Arcus senilis, bilateral: Secondary | ICD-10-CM | POA: Diagnosis not present

## 2019-09-29 DIAGNOSIS — H2512 Age-related nuclear cataract, left eye: Secondary | ICD-10-CM | POA: Diagnosis not present

## 2019-09-29 DIAGNOSIS — H25042 Posterior subcapsular polar age-related cataract, left eye: Secondary | ICD-10-CM | POA: Diagnosis not present

## 2019-09-29 DIAGNOSIS — H25012 Cortical age-related cataract, left eye: Secondary | ICD-10-CM | POA: Diagnosis not present

## 2019-10-06 ENCOUNTER — Encounter: Payer: Medicare Other | Attending: Physical Medicine and Rehabilitation | Admitting: Registered Nurse

## 2019-10-06 ENCOUNTER — Other Ambulatory Visit: Payer: Self-pay

## 2019-10-06 ENCOUNTER — Encounter: Payer: Self-pay | Admitting: Registered Nurse

## 2019-10-06 VITALS — Ht 64.0 in | Wt 179.0 lb

## 2019-10-06 DIAGNOSIS — Z79891 Long term (current) use of opiate analgesic: Secondary | ICD-10-CM

## 2019-10-06 DIAGNOSIS — M545 Low back pain: Secondary | ICD-10-CM

## 2019-10-06 DIAGNOSIS — G894 Chronic pain syndrome: Secondary | ICD-10-CM

## 2019-10-06 DIAGNOSIS — R5382 Chronic fatigue, unspecified: Secondary | ICD-10-CM | POA: Insufficient documentation

## 2019-10-06 DIAGNOSIS — Z5181 Encounter for therapeutic drug level monitoring: Secondary | ICD-10-CM | POA: Diagnosis not present

## 2019-10-06 DIAGNOSIS — G8929 Other chronic pain: Secondary | ICD-10-CM

## 2019-10-06 DIAGNOSIS — Z87891 Personal history of nicotine dependence: Secondary | ICD-10-CM

## 2019-10-06 DIAGNOSIS — M79672 Pain in left foot: Secondary | ICD-10-CM | POA: Insufficient documentation

## 2019-10-06 DIAGNOSIS — M797 Fibromyalgia: Secondary | ICD-10-CM | POA: Diagnosis not present

## 2019-10-06 DIAGNOSIS — Z79899 Other long term (current) drug therapy: Secondary | ICD-10-CM | POA: Insufficient documentation

## 2019-10-06 DIAGNOSIS — M4302 Spondylolysis, cervical region: Secondary | ICD-10-CM | POA: Insufficient documentation

## 2019-10-06 MED ORDER — HYDROCODONE-ACETAMINOPHEN 5-325 MG PO TABS: 1.0000 | ORAL_TABLET | Freq: Four times a day (QID) | ORAL | 0 refills | Status: DC | PRN

## 2019-10-06 NOTE — Progress Notes (Signed)
Subjective:    Patient ID: Kara Bush, female    DOB: [birth year], 79 y.o.   MRN: [id number]  HPI: Kara Bush is a 79 y.o. female whose appointment was changed to a virtual office visit to reduce the risk of exposure to the COVID-19 virus and to help Kara Bush  remain healthy and safe. The virtual visit will also provide continuity of care. Kara Bush agrees with virtual visit and verbalizes understanding. She states her pain is located in her mid- lower back pain mainly right side, also states at times she has increase intensity of pain,and has already taken her current medication regimen. We will increase her tablets this month and evaluate her pain. She was instructed to send a My-Chart Message to evaluate, she verbalizes understanding. She rates her pain 3.  current exercise regime is walking and performing stretching exercises.   Kara Bush Morphine equivalent is 20.00 MME.    Last Oral Swab was Performed on 04/12/2019, it was consistent.   [name] CMA asked The Health and History Questions. This provider and Mancel Parsons verified we were speaking with the correct person using two identifiers.    Pain Inventory Average Pain 5 Pain Right Now 3 My pain is constant and aching  In the last 24 hours, has pain interfered with the following? General activity 0 Relation with others 0 Enjoyment of life 2 What TIME of day is your pain at its worst? morning Sleep (in general) Good  Pain is worse with: walking, bending and some activites Pain improves with: rest, pacing activities and medication Relief from Meds: 6  Mobility   Function retired  Neuro/Psych No problems in this area  Prior Studies Any changes since last visit?  no  Physicians involved in your care Any changes since last visit?  no   Family History  Problem Relation Age of Onset  . Heart disease Father   . Heart attack Father        @ 55  . Cancer Paternal Aunt        BREAST    . Heart failure Mother        at 60  . Healthy Sister   . Cancer Brother        pancreatic   . Heart failure Maternal Grandmother   . Heart attack Maternal Grandfather   . Heart attack Paternal Grandmother        at 63  . Healthy Daughter   . Healthy Daughter    Social History   Socioeconomic History  . Marital status: Divorced    Spouse name: Not on file  . Number of children: 2  . Years of education: college   . Highest education level: Not on file  Occupational History    Employer: RETIRED  Tobacco Use  . Smoking status: Former Smoker    Packs/day: 2.50    Years: 20.00    Pack years: 50.00    Types: Cigarettes    Quit date: 06/29/1990    Years since quitting: 29.2  . Smokeless tobacco: Never Used  Substance and Sexual Activity  . Alcohol use: No  . Drug use: No  . Sexual activity: Not on file  Other Topics Concern  . Not on file  Social History Narrative  . Not on file   Social Determinants of Health   Financial Resource Strain:   . Difficulty of Paying Living Expenses: Not on file  Food Insecurity:   . Worried About Estate manager/land agent  of Food in the Last Year: Not on file  . Ran Out of Food in the Last Year: Not on file  Transportation Needs:   . Lack of Transportation (Medical): Not on file  . Lack of Transportation (Non-Medical): Not on file  Physical Activity:   . Days of Exercise per Week: Not on file  . Minutes of Exercise per Session: Not on file  Stress:   . Feeling of Stress : Not on file  Social Connections:   . Frequency of Communication with Friends and Family: Not on file  . Frequency of Social Gatherings with Friends and Family: Not on file  . Attends Religious Services: Not on file  . Active Member of Clubs or Organizations: Not on file  . Attends Archivist Meetings: Not on file  . Marital Status: Not on file   Past Surgical History:  Procedure Laterality Date  . [place]  . CHOLECYSTECTOMY  1997  .  CORONARY ANGIOPLASTY WITH STENT PLACEMENT  2006   to LAD, ATRETIC LIMA AT THAT TIME ALSO  . CORONARY ANGIOPLASTY WITH STENT PLACEMENT  2007   STENT TO PROX VG-DIAG, OTHER STENTS PATENT  . CORONARY ARTERY BYPASS GRAFT  1998   LIMA-LAD;VG-DIAG & RCA  . CORONARY STENT PLACEMENT  2005   TO LAD & LCX-STENTS,  . HERNIA REPAIR    . TUBAL LIGATION     BTL   Past Medical History:  Diagnosis Date  . Abdominal aortic aneurysm ([place])   . Anemia, improved 01/11/2013  . Basal cell carcinoma   . CAD (coronary artery disease)    hx CABG 1998, last cath 2007 with PCI and stenting to the proximal segment of the vein graft to the diagonal branch. DES Taxus was placed  . Cervicalgia   . Chest wall pain   . Chronic fatigue fibromyalgia syndrome   . Chronic pain syndrome   . Coronary artery disease   . Depression   . Fibromyalgia   . Fibromyalgia   . History of nuclear stress test 08/07/2011   lexiscan; no evidence of ischemia or infarct; low risk   . Hyperlipidemia   . Hypertension   . Hypertension   . Obstructive sleep apnea   . OSA (obstructive sleep apnea)   . PAD (peripheral artery disease) ([place])   . Pes anserine bursitis   . Pulmonary embolism ([place]) 01/11/2013  . S/P resection of aortic aneurysm   . Trochanteric bursitis   . Ulcerative colitis ([place])    Ht 5\' 4"  (1.626 m)   Wt 179 lb (81.2 kg)   BMI 30.73 kg/m   Opioid Risk Score:   Fall Risk Score:  `1  Depression screen PHQ 2/9  Depression screen [facility] 2/9 08/11/2019 04/13/2018 02/12/2018 12/11/2017 08/18/2017 08/21/2015 11/07/2014  Decreased Interest 0 0 0 0 0 0 0  Down, Depressed, Hopeless 0 0 0 0 0 0 0  PHQ - 2 Score 0 0 0 0 0 0 0  Tired, decreased energy - - - - - - 1  Change in appetite - - - - - - 1  Feeling bad or failure about yourself  - - - - - - 0  Trouble concentrating - - - - - - 0  Moving slowly or fidgety/restless - - - - - - 0  Suicidal thoughts - - - - - - 0    Review of Systems  Constitutional: Negative.   HENT:  Negative.   Respiratory: Negative.  Cardiovascular: Negative.   Gastrointestinal: Negative.   Endocrine: Negative.   Genitourinary: Negative.   Musculoskeletal: Positive for arthralgias and back pain.  Skin: Negative.   Allergic/Immunologic: Negative.   Hematological: Negative.   Psychiatric/Behavioral: Negative.        Objective:   Physical Exam Vitals and nursing note reviewed.  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 Fibromyalgia: Continue Pool therapy once pool opensand Continue  Home Exercise Program.10/06/2019 3. Bilateral intermittent hand numbness: Carpal tunnel syndrome versus C6 radiculopathy mild:No complaints today.Continue to Monitor.10/06/2019 4.Chronic Midline Low Back Pain/LBP: Refilled: Increased: Norco 5/325mg  #130pills--use one pill every 6 hours as neededfor pain. A second prescription wase-scribefor the followingmonth.10/06/2019. We will continue the opioid monitoring program, this consists of regular clinic visits, examinations, urine drug screen, pill counts as well as use of [place] Controlled Substance Reporting System.  5. Muscle Spasm: Continuecurrent medication regimen withRobaxin.10/06/2019 6.BilateralGreater Trochanteric Bursitis: No complaints today.Continue to Alternate with heat and ice therapy. Continue current medication regime.10/06/2019 7. Left Foot pain: No complaints today.Continue with HEP as Tolerated. Continue to Monitor. 10/06/2019 8. Right elbow Pain: No complaints today. Continue HEP as Tolerated. Continue to Monitor. 10/06/2019  F/U in 2 months  Tele-Health Visit Telephone Call Established Patient Location of patient: In her Home Location of Provider in Office Time Spent: 10  Minutes

## 2019-10-12 DIAGNOSIS — H2512 Age-related nuclear cataract, left eye: Secondary | ICD-10-CM | POA: Diagnosis not present

## 2019-10-13 DIAGNOSIS — E782 Mixed hyperlipidemia: Secondary | ICD-10-CM | POA: Diagnosis not present

## 2019-10-13 DIAGNOSIS — Z79899 Other long term (current) drug therapy: Secondary | ICD-10-CM | POA: Diagnosis not present

## 2019-10-13 LAB — COMPREHENSIVE METABOLIC PANEL
ALT: 23 IU/L (ref 0–32)
AST: 26 IU/L (ref 0–40)
Albumin/Globulin Ratio: 2.1 (ref 1.2–2.2)
Albumin: 4.4 g/dL (ref 3.7–4.7)
Alkaline Phosphatase: 53 IU/L (ref 39–117)
BUN/Creatinine Ratio: 21 (ref 12–28)
BUN: 18 mg/dL (ref 8–27)
Bilirubin Total: 0.3 mg/dL (ref 0.0–1.2)
CO2: 26 mmol/L (ref 20–29)
Calcium: 9.6 mg/dL (ref 8.7–10.3)
Chloride: 101 mmol/L (ref 96–106)
Creatinine, Ser: 0.87 mg/dL (ref 0.57–1.00)
GFR calc Af Amer: 73 mL/min/{1.73_m2} (ref >59)
GFR calc non Af Amer: 63 mL/min/{1.73_m2} (ref >59)
Globulin, Total: 2.1 g/dL (ref 1.5–4.5)
Glucose: 99 mg/dL (ref 65–99)
Potassium: 4.8 mmol/L (ref 3.5–5.2)
Sodium: 141 mmol/L (ref 134–144)
Total Protein: 6.5 g/dL (ref 6.0–8.5)

## 2019-10-13 LAB — LIPID PANEL
Chol/HDL Ratio: 2.5 ratio (ref 0.0–4.4)
Cholesterol, Total: 141 mg/dL (ref 100–199)
HDL: 56 mg/dL (ref >39)
LDL Chol Calc (NIH): 63 mg/dL (ref 0–99)
Triglycerides: 128 mg/dL (ref 0–149)
VLDL Cholesterol Cal: 22 mg/dL (ref 5–40)

## 2019-10-17 ENCOUNTER — Other Ambulatory Visit: Payer: Self-pay | Admitting: Internal Medicine

## 2019-10-17 DIAGNOSIS — H903 Sensorineural hearing loss, bilateral: Secondary | ICD-10-CM | POA: Diagnosis not present

## 2019-10-20 ENCOUNTER — Encounter: Payer: Self-pay | Admitting: Internal Medicine

## 2019-10-27 DIAGNOSIS — Z1231 Encounter for screening mammogram for malignant neoplasm of breast: Secondary | ICD-10-CM | POA: Diagnosis not present

## 2019-11-15 DIAGNOSIS — I4891 Unspecified atrial fibrillation: Secondary | ICD-10-CM | POA: Diagnosis not present

## 2019-11-15 DIAGNOSIS — I739 Peripheral vascular disease, unspecified: Secondary | ICD-10-CM | POA: Diagnosis not present

## 2019-11-15 DIAGNOSIS — F419 Anxiety disorder, unspecified: Secondary | ICD-10-CM | POA: Diagnosis not present

## 2019-11-15 DIAGNOSIS — F339 Major depressive disorder, recurrent, unspecified: Secondary | ICD-10-CM | POA: Diagnosis not present

## 2019-11-15 DIAGNOSIS — I2510 Atherosclerotic heart disease of native coronary artery without angina pectoris: Secondary | ICD-10-CM | POA: Diagnosis not present

## 2019-11-15 DIAGNOSIS — G479 Sleep disorder, unspecified: Secondary | ICD-10-CM | POA: Diagnosis not present

## 2019-11-15 DIAGNOSIS — Z0000 Encounter for general adult medical examination without abnormal findings: Secondary | ICD-10-CM | POA: Diagnosis not present

## 2019-11-15 DIAGNOSIS — Z951 Presence of aortocoronary bypass graft: Secondary | ICD-10-CM | POA: Diagnosis not present

## 2019-11-15 DIAGNOSIS — E7800 Pure hypercholesterolemia, unspecified: Secondary | ICD-10-CM | POA: Diagnosis not present

## 2019-11-15 DIAGNOSIS — Z7901 Long term (current) use of anticoagulants: Secondary | ICD-10-CM | POA: Diagnosis not present

## 2019-11-15 DIAGNOSIS — R7303 Prediabetes: Secondary | ICD-10-CM | POA: Diagnosis not present

## 2019-11-15 DIAGNOSIS — I10 Essential (primary) hypertension: Secondary | ICD-10-CM | POA: Diagnosis not present

## 2019-12-01 DIAGNOSIS — M1990 Unspecified osteoarthritis, unspecified site: Secondary | ICD-10-CM | POA: Diagnosis not present

## 2019-12-01 DIAGNOSIS — F339 Major depressive disorder, recurrent, unspecified: Secondary | ICD-10-CM | POA: Diagnosis not present

## 2019-12-01 DIAGNOSIS — I10 Essential (primary) hypertension: Secondary | ICD-10-CM | POA: Diagnosis not present

## 2019-12-01 DIAGNOSIS — I4891 Unspecified atrial fibrillation: Secondary | ICD-10-CM | POA: Diagnosis not present

## 2019-12-01 DIAGNOSIS — E7800 Pure hypercholesterolemia, unspecified: Secondary | ICD-10-CM | POA: Diagnosis not present

## 2019-12-01 DIAGNOSIS — I2510 Atherosclerotic heart disease of native coronary artery without angina pectoris: Secondary | ICD-10-CM | POA: Diagnosis not present

## 2019-12-07 DIAGNOSIS — H02005 Unspecified entropion of left lower eyelid: Secondary | ICD-10-CM | POA: Diagnosis not present

## 2019-12-07 DIAGNOSIS — H16141 Punctate keratitis, right eye: Secondary | ICD-10-CM | POA: Diagnosis not present

## 2019-12-07 DIAGNOSIS — H02889 Meibomian gland dysfunction of unspecified eye, unspecified eyelid: Secondary | ICD-10-CM | POA: Diagnosis not present

## 2019-12-07 DIAGNOSIS — H01009 Unspecified blepharitis unspecified eye, unspecified eyelid: Secondary | ICD-10-CM | POA: Diagnosis not present

## 2019-12-07 NOTE — Progress Notes (Signed)
Office Visit Note  Patient: Kara Bush             Date of Birth: Sep 02, 1940           MRN: [id number]             PCP: Leighton Ruff, MD Referring: Leighton Ruff, MD Visit Date: 12/13/2019 Occupation: @GUAROCC @  Subjective:  Right elbow pain   History of Present Illness: Kara Bush is a 79 y.o. female with history of osteoarthritis and fibromyalgia.  She states she has been having stiffness in her right shoulder joint.  She has been also using her iPad with a stylus.  She has been experiencing discomfort in her right elbow over the lateral epicondyle area for the last few months.  She states the discomfort is getting worse.  She has been using Voltaren gel and Arnica gel without much relief.  She continues to have some discomfort in her feet.  Activities of Daily Living:  Patient reports joint stiffness all day  Patient Reports nocturnal pain.  Difficulty dressing/grooming: Denies Difficulty climbing stairs: Reports Difficulty getting out of chair: Reports Difficulty using hands for taps, buttons, cutlery, and/or writing: Reports  Review of Systems  Constitutional: Positive for fatigue.  HENT: Negative for mouth sores, mouth dryness and nose dryness.   Eyes: Positive for dryness. Negative for pain, redness and visual disturbance.  Respiratory: Negative for cough, hemoptysis, shortness of breath and difficulty breathing.   Cardiovascular: Negative for chest pain, palpitations, hypertension and swelling in legs/feet.  Gastrointestinal: Negative for blood in stool, constipation and diarrhea.  Endocrine: Negative for increased urination.  Genitourinary: Negative for difficulty urinating and painful urination.  Musculoskeletal: Positive for arthralgias, joint pain, myalgias, morning stiffness, muscle tenderness and myalgias. Negative for joint swelling and muscle weakness.  Skin: Negative for color change, pallor, rash, hair loss, nodules/bumps, redness, skin  tightness, ulcers and sensitivity to sunlight.  Allergic/Immunologic: Negative for susceptible to infections.  Neurological: Negative for dizziness, light-headedness, numbness, headaches, memory loss and weakness.  Hematological: Negative for bruising/bleeding tendency and swollen glands.  Psychiatric/Behavioral: Negative for depressed mood, confusion and sleep disturbance. The patient is not nervous/anxious.     PMFS History:  Patient Active Problem List   Diagnosis Date Noted  . Atrial fibrillation ([place]) 09/09/2018  . DOE (dyspnea on exertion) 08/10/2017  . 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 Greater trochanteric bursitis of right hip 11/07/2014  . Sacro-iliac pain 11/07/2014  . Chronic midline low back pain without sciatica 11/07/2013  . Hepatitis 04/20/2013  . Screening for STD (sexually transmitted disease) 04/20/2013  . Recurrent UTI 04/20/2013  . Abdominal aortic aneurysm ([place])   . Ulcerative colitis ([place])   . Obstructive sleep apnea   . Fibromyalgia   . Coronary artery disease   . Basal cell carcinoma   . Anemia, improved 01/11/2013  . Ascending aortic aneurysm, 4.1 cm 01/11/2013  . 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 Chronic periscapular pain 10/15/2011  . Chest wall pain   . S/P resection of aortic aneurysm, 1998 prior to CABG   . PAD (peripheral artery disease), aortic-iliac bypass 1991, mild carotid bruits   . OSA (obstructive sleep apnea)   . Chronic fatigue fibromyalgia syndrome   . Hyperlipidemia   . Ulcerative colitis   . Depression   . CAD (coronary artery disease), CABG 1998, STENTS MULTIPLE SITES SINCE.    Marland Kitchen Hypertension     Past Medical History:  Diagnosis Date  . Abdominal aortic aneurysm (  [facility])   . Anemia, improved 01/11/2013  . Basal cell carcinoma   . CAD (coronary artery disease)    hx CABG 1998, last cath 2007 with PCI and stenting to the proximal segment of the vein graft to the diagonal branch. DES Taxus was placed  .  Cervicalgia   . Chest wall pain   . Chronic fatigue fibromyalgia syndrome   . Chronic pain syndrome   . Coronary artery disease   . Depression   . Fibromyalgia   . Fibromyalgia   . History of nuclear stress test 08/07/2011   lexiscan; no evidence of ischemia or infarct; low risk   . Hyperlipidemia   . Hypertension   . Hypertension   . Obstructive sleep apnea   . OSA (obstructive sleep apnea)   . PAD (peripheral artery disease) ([place])   . Pes anserine bursitis   . Pulmonary embolism ([place]) 01/11/2013  . S/P resection of aortic aneurysm   . Trochanteric bursitis   . Ulcerative colitis ([place])     Family History  Problem Relation Age of Onset  . Heart disease Father   . Heart attack Father        @ 28  . Cancer Paternal Aunt        BREAST  . Heart failure Mother        at 16  . Healthy Sister   . Cancer Brother        pancreatic   . Heart failure Maternal Grandmother   . Heart attack Maternal Grandfather   . Heart attack Paternal Grandmother        at 10  . Healthy Daughter   . Healthy Daughter    Past Surgical History:  Procedure Laterality Date  . [place]  . CATARACT EXTRACTION Left 09/2019  . CHOLECYSTECTOMY  1997  . CORONARY ANGIOPLASTY WITH STENT PLACEMENT  2006   to LAD, ATRETIC LIMA AT THAT TIME ALSO  . CORONARY ANGIOPLASTY WITH STENT PLACEMENT  2007   STENT TO PROX VG-DIAG, OTHER STENTS PATENT  . CORONARY ARTERY BYPASS GRAFT  1998   LIMA-LAD;VG-DIAG & RCA  . CORONARY STENT PLACEMENT  2005   TO LAD & LCX-STENTS,  . HERNIA REPAIR    . TUBAL LIGATION     BTL   Social History   Social History Narrative  . Not on file   Immunization History  Administered Date(s) Administered  . Influenza,inj,Quad PF,6+ Mos 04/20/2013, 06/01/2014     Objective: Vital Signs: BP (!) 168/84 (BP Location: Left Wrist, Patient Position: Sitting, Cuff Size: Normal)   Pulse 81   Resp 14   Ht 5\' 4"  (1.626 m)   Wt 183 lb 12.8 oz (83.4 kg)   BMI 31.55  kg/m    Physical Exam Vitals and nursing note reviewed.  Constitutional:      Appearance: She is well-developed.  HENT:     Head: Normocephalic and atraumatic.  Eyes:     Conjunctiva/sclera: Conjunctivae normal.  Cardiovascular:     Rate and Rhythm: Normal rate and regular rhythm.     Heart sounds: Normal heart sounds.  Pulmonary:     Effort: Pulmonary effort is normal.     Breath sounds: Normal breath sounds.  Abdominal:     General: Bowel sounds are normal.     Palpations: Abdomen is soft.  Musculoskeletal:     Cervical back: Normal range of motion.  Lymphadenopathy:     Cervical: No cervical adenopathy.  Skin:  General: Skin is warm and dry.     Capillary Refill: Capillary refill takes less than 2 seconds.  Neurological:     Mental Status: She is alert and oriented to person, place, and time.  Psychiatric:        Behavior: Behavior normal.      Musculoskeletal Exam: C-spine is limited range of motion.  She has painful abduction of the right arm consistent with subacromial bursitis.  Left shoulder joint was in good range of motion.  She has tenderness on palpation over the right lateral epicondyle region consistent with lateral epicondylitis.  Wrist joints are in good range of motion.  She has bilateral PIP and DIP thickening consistent with osteoarthritis.  Hip joints and knee joints with good range of motion.  She had tenderness over bilateral trochanteric bursa consistent with trochanteric bursitis.  CDAI Exam: CDAI Score: -- Patient Global: --; Provider Global: -- Swollen: --; Tender: -- Joint Exam 12/13/2019   No joint exam has been documented for this visit   There is currently no information documented on the homunculus. Go to the Rheumatology activity and complete the homunculus joint exam.  Investigation: No additional findings.  Imaging: No results found.  Recent Labs: Lab Results  Component Value Date   WBC 9.3 11/20/2015   HGB 13.1 11/20/2015    PLT 253 11/20/2015   NA 141 10/13/2019   K 4.8 10/13/2019   CL 101 10/13/2019   CO2 26 10/13/2019   GLUCOSE 99 10/13/2019   BUN 18 10/13/2019   CREATININE 0.87 10/13/2019   BILITOT 0.3 10/13/2019   ALKPHOS 53 10/13/2019   AST 26 10/13/2019   ALT 23 10/13/2019   PROT 6.5 10/13/2019   ALBUMIN 4.4 10/13/2019   CALCIUM 9.6 10/13/2019   GFRAA 73 10/13/2019    Speciality Comments: No specialty comments available.  Procedures:  Medium Joint Inj: R lateral epicondyle on 12/13/2019 12:47 PM Indications: pain Details: 27 G 1.5 in needle, posterior approach Medications: 1 mL lidocaine 1 %; 30 mg triamcinolone acetonide 40 MG/ML Aspirate: 0 mL Outcome: tolerated well, no immediate complications Procedure, treatment alternatives, risks and benefits explained, specific risks discussed. Consent was given by the patient. Immediately prior to procedure a time out was called to verify the correct patient, procedure, equipment, support staff and site/side marked as required. Patient was prepped and draped in the usual sterile fashion.     Allergies: Lisinopril, Atenolol, Bactrim [sulfamethoxazole-trimethoprim], Cymbalta [duloxetine hcl], Lyrica [pregabalin], and Penicillins   Assessment / Plan:     Visit Diagnoses: Lateral epicondylitis, right elbow-she has been having pain and discomfort in her right elbow for the last few months.  She had tenderness over right lateral region was injected with cortisone consistent with lateral epicondylitis.  She states that she is having nocturnal pain.  After informed consent was obtained right lateral epicondyle was injected with cortisone as described below.  She tolerated the procedure well.  Postprocedure instructions were given.  I will also refer her to physical therapy.  Chronic right shoulder pain-she had painful abduction and subacromial tenderness consistent with subacromial bursitis.  I will refer her to physical therapy.  A handout on exercises was  given.  Trochanteric bursitis of both hips-she has chronic discomfort in the trochanteric bursa which is mild currently.  Primary osteoarthritis of both hands-joint protection was discussed.  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  Sacro-iliac pain-chronic pain.  Fibromyalgia-she continues to have generalized pain and discomfort.  Other medical problems are  listed as follows:  Primary insomnia  History of ulcerative colitis  History of depression  History of hypertension  History of hyperlipidemia  History of coronary artery disease  OSA (obstructive sleep apnea)  Coronary artery disease status post CABG  Orders: Orders Placed This Encounter  Procedures  . Medium Joint Inj  . Ambulatory referral to Physical Therapy   No orders of the defined types were placed in this encounter.    Follow-Up Instructions: Return in about 6 months (around 06/14/2020) for Osteoarthritis, FMS.   Bo Merino, MD  Note - This record has been created using Editor, commissioning.  Chart creation errors have been sought, but may not always  have been located. Such creation errors do not reflect on  the standard of medical care.

## 2019-12-08 ENCOUNTER — Encounter: Payer: Medicare Other | Attending: Physical Medicine and Rehabilitation | Admitting: Registered Nurse

## 2019-12-08 ENCOUNTER — Other Ambulatory Visit: Payer: Self-pay

## 2019-12-08 VITALS — BP 140/81 | HR 81 | Temp 98.3°F | Ht 64.0 in | Wt 182.0 lb

## 2019-12-08 DIAGNOSIS — M542 Cervicalgia: Secondary | ICD-10-CM

## 2019-12-08 DIAGNOSIS — G8929 Other chronic pain: Secondary | ICD-10-CM

## 2019-12-08 DIAGNOSIS — Z79899 Other long term (current) drug therapy: Secondary | ICD-10-CM | POA: Insufficient documentation

## 2019-12-08 DIAGNOSIS — M79672 Pain in left foot: Secondary | ICD-10-CM | POA: Insufficient documentation

## 2019-12-08 DIAGNOSIS — Z79891 Long term (current) use of opiate analgesic: Secondary | ICD-10-CM | POA: Insufficient documentation

## 2019-12-08 DIAGNOSIS — Z5181 Encounter for therapeutic drug level monitoring: Secondary | ICD-10-CM | POA: Diagnosis not present

## 2019-12-08 DIAGNOSIS — M778 Other enthesopathies, not elsewhere classified: Secondary | ICD-10-CM

## 2019-12-08 DIAGNOSIS — M4302 Spondylolysis, cervical region: Secondary | ICD-10-CM | POA: Diagnosis not present

## 2019-12-08 DIAGNOSIS — M797 Fibromyalgia: Secondary | ICD-10-CM | POA: Diagnosis not present

## 2019-12-08 DIAGNOSIS — M545 Low back pain, unspecified: Secondary | ICD-10-CM

## 2019-12-08 DIAGNOSIS — I2510 Atherosclerotic heart disease of native coronary artery without angina pectoris: Secondary | ICD-10-CM | POA: Diagnosis not present

## 2019-12-08 DIAGNOSIS — R5382 Chronic fatigue, unspecified: Secondary | ICD-10-CM | POA: Insufficient documentation

## 2019-12-08 DIAGNOSIS — M7711 Lateral epicondylitis, right elbow: Secondary | ICD-10-CM | POA: Diagnosis not present

## 2019-12-08 DIAGNOSIS — G894 Chronic pain syndrome: Secondary | ICD-10-CM | POA: Diagnosis not present

## 2019-12-08 MED ORDER — HYDROCODONE-ACETAMINOPHEN 5-325 MG PO TABS: 1.0000 | ORAL_TABLET | Freq: Four times a day (QID) | ORAL | 0 refills | Status: DC | PRN

## 2019-12-08 NOTE — Progress Notes (Signed)
Subjective:    Patient ID: Kara Bush, female    DOB: January 27, 1941, 79 y.o.   MRN: [id number]  HPI: Kara Bush is a 79 y.o. female who returns for follow up appointment for chronic pain and medication refill. She states her pain is located in her neck, right elbow, right shoulder, mid- lower back pain,bilateral hips and left foot. Also reports generalized pain. Kara Bush reports increase intensity of pain not being relieved with current medication regime, her tablets will be increased this month and evaluated at her next scheduled appointment, she verbalizes understanding. She rates her pain 6. Her current exercise regime is walking and performing stretching exercises.  Kara Bush Morphine equivalent is 21.67 MME.  Oral Swab Performed Today.   Pain Inventory Average Pain 4 Pain Right Now 6 My pain is constant and aching  In the last 24 hours, has pain interfered with the following? General activity 4 Relation with others 0 Enjoyment of life 4 What TIME of day is your pain at its worst? morning Sleep (in general) Good  Pain is worse with: walking, bending and some activites Pain improves with: medication Relief from Meds: 5  Mobility how many minutes can you walk? 5 ability to climb steps?  yes do you drive?  yes  Function retired  Neuro/Psych No problems in this area  Prior Studies Any changes since last visit?  no  Physicians involved in your care Any changes since last visit?  no   Family History  Problem Relation Age of Onset  . Heart disease Father   . Heart attack Father        @ 69  . Cancer Paternal Aunt        BREAST  . Heart failure Mother        at 68  . Healthy Sister   . Cancer Brother        pancreatic   . Heart failure Maternal Grandmother   . Heart attack Maternal Grandfather   . Heart attack Paternal Grandmother        at 67  . Healthy Daughter   . Healthy Daughter    Social History   Socioeconomic History  . Marital  status: Divorced    Spouse name: Not on file  . Number of children: 2  . Years of education: college   . Highest education level: Not on file  Occupational History    Employer: RETIRED  Tobacco Use  . Smoking status: Former Smoker    Packs/day: 2.50    Years: 20.00    Pack years: 50.00    Types: Cigarettes    Quit date: 06/29/1990    Years since quitting: 29.4  . Smokeless tobacco: Never Used  Substance and Sexual Activity  . Alcohol use: No  . Drug use: No  . Sexual activity: Not on file  Other Topics Concern  . Not on file  Social History Narrative  . Not on file   Social Determinants of Health   Financial Resource Strain:   . Difficulty of Paying Living Expenses:   Food Insecurity:   . Worried About Charity fundraiser in the Last Year:   . Arboriculturist in the Last Year:   Transportation Needs:   . Film/video editor (Medical):   Marland Kitchen Lack of Transportation (Non-Medical):   Physical Activity:   . Days of Exercise per Week:   . Minutes of Exercise per Session:   Stress:   . Feeling  of Stress :   Social Connections:   . Frequency of Communication with Friends and Family:   . Frequency of Social Gatherings with Friends and Family:   . Attends Religious Services:   . Active Member of Clubs or Organizations:   . Attends Archivist Meetings:   Marland Kitchen Marital Status:    Past Surgical History:  Procedure Laterality Date  . [place]  . CHOLECYSTECTOMY  1997  . CORONARY ANGIOPLASTY WITH STENT PLACEMENT  2006   to LAD, ATRETIC LIMA AT THAT TIME ALSO  . CORONARY ANGIOPLASTY WITH STENT PLACEMENT  2007   STENT TO PROX VG-DIAG, OTHER STENTS PATENT  . CORONARY ARTERY BYPASS GRAFT  1998   LIMA-LAD;VG-DIAG & RCA  . CORONARY STENT PLACEMENT  2005   TO LAD & LCX-STENTS,  . HERNIA REPAIR    . TUBAL LIGATION     BTL   Past Medical History:  Diagnosis Date  . Abdominal aortic aneurysm ([place])   . Anemia, improved 01/11/2013  . Basal cell  carcinoma   . CAD (coronary artery disease)    hx CABG 1998, last cath 2007 with PCI and stenting to the proximal segment of the vein graft to the diagonal branch. DES Taxus was placed  . Cervicalgia   . Chest wall pain   . Chronic fatigue fibromyalgia syndrome   . Chronic pain syndrome   . Coronary artery disease   . Depression   . Fibromyalgia   . Fibromyalgia   . History of nuclear stress test 08/07/2011   lexiscan; no evidence of ischemia or infarct; low risk   . Hyperlipidemia   . Hypertension   . Hypertension   . Obstructive sleep apnea   . OSA (obstructive sleep apnea)   . PAD (peripheral artery disease) ([place])   . Pes anserine bursitis   . Pulmonary embolism ([place]) 01/11/2013  . S/P resection of aortic aneurysm   . Trochanteric bursitis   . Ulcerative colitis ([facility])    BP 140/81   Pulse 81   Temp 98.3 F (36.8 C)   Ht 5\' 4"  (1.626 m)   Wt 182 lb (82.6 kg)   SpO2 95%   BMI 31.24 kg/m   Opioid Risk Score:   Fall Risk Score:  `1  Depression screen PHQ 2/9  Depression screen [facility] 2/9 08/11/2019 04/13/2018 02/12/2018 12/11/2017 08/18/2017 08/21/2015 11/07/2014  Decreased Interest 0 0 0 0 0 0 0  Down, Depressed, Hopeless 0 0 0 0 0 0 0  PHQ - 2 Score 0 0 0 0 0 0 0  Tired, decreased energy - - - - - - 1  Change in appetite - - - - - - 1  Feeling bad or failure about yourself  - - - - - - 0  Trouble concentrating - - - - - - 0  Moving slowly or fidgety/restless - - - - - - 0  Suicidal thoughts - - - - - - 0    Review of Systems  All other systems reviewed and are negative.      Objective:   Physical Exam Vitals and nursing note reviewed.  Constitutional:      Appearance: Normal appearance.  Cardiovascular:     Rate and Rhythm: Normal rate and regular rhythm.     Pulses: Normal pulses.     Heart sounds: Normal heart sounds.  Pulmonary:     Effort: Pulmonary effort is normal.     Breath sounds: Normal breath  sounds.  Musculoskeletal:     Cervical back: Normal range  of motion and neck supple.     Comments: Normal Muscle Bulk and Muscle Testing Reveals:  Upper Extremities: Right: Decrease ROM 45 Degrees and Muscle Strength 5/5 Left: Full ROM and Muscle Strength 5/5  Thoracic Paraspinal Tenderness: T-1-T-3 T-7-T-9 Lumbar Paraspinal Tenderness: L-4-L-5 Bilateral Greater Trochanteric Tenderness Lower Extremities: Full ROM and Muscle Strength 5/5 Arises from Table with Ease  Narrow Based  Gait   Skin:    General: Skin is warm and dry.  Neurological:     Mental Status: She is alert and oriented to person, place, and time.  Psychiatric:        Mood and Affect: Mood normal.        Behavior: Behavior normal.           Assessment & Plan:  1.Cervical spondylosis, cervicalgia with radiation to right scapular region: Continue to Monitor, Continue Current Medication and exercise regime.12/08/2019 2. Fibromyalgia: Continue Pool therapyonce pool opensandContinueHome Exercise Program.12/08/2019 3. Bilateral intermittent hand numbness: Carpal tunnel syndrome versus C6 radiculopathy mild:No complaints today.Continue to Monitor.12/08/2019 4.Chronic Midline Low Back Pain/LBP: Refilled: Increased: Norco 5/325mg  #140pills--use one pill every 6 hours as neededfor pain. A second prescription wase-scribefor the followingmonth.12/08/2019. We will continue the opioid monitoring program, this consists of regular clinic visits, examinations, urine drug screen, pill counts as well as use of [place] Controlled Substance Reporting System.  5. Muscle Spasm: Continuecurrent medication regimen withRobaxin.12/08/2019 6.BilateralGreater Trochanteric Bursitis:No complaints today.Continue to Alternate with heat and ice therapy. Continue current medication regime.12/08/2019 7. Left Foot pain:Continue with HEP as Tolerated. Continue to Monitor. 12/08/2019 8. Right Lateral Epicondylitis Pain: Continue HEP as Tolerated. Continue to Monitor.  12/08/2019 9. Right Shoulder Tendonitis: Continue Current Medication Regimen. Continue to Alternate Ice and Heat Therapy. Also reports she has an appointment with her Rheumatologist.   F/U in 2 months  15 minutes of face to face patient care time was spent during this visit. All questions were encouraged and answered.

## 2019-12-11 ENCOUNTER — Encounter: Payer: Self-pay | Admitting: Registered Nurse

## 2019-12-12 DIAGNOSIS — N904 Leukoplakia of vulva: Secondary | ICD-10-CM | POA: Diagnosis not present

## 2019-12-12 DIAGNOSIS — N898 Other specified noninflammatory disorders of vagina: Secondary | ICD-10-CM | POA: Diagnosis not present

## 2019-12-12 DIAGNOSIS — R252 Cramp and spasm: Secondary | ICD-10-CM | POA: Diagnosis not present

## 2019-12-13 ENCOUNTER — Ambulatory Visit (INDEPENDENT_AMBULATORY_CARE_PROVIDER_SITE_OTHER): Payer: Medicare Other | Admitting: Rheumatology

## 2019-12-13 ENCOUNTER — Encounter: Payer: Self-pay | Admitting: Rheumatology

## 2019-12-13 ENCOUNTER — Other Ambulatory Visit: Payer: Self-pay

## 2019-12-13 VITALS — BP 168/84 | HR 81 | Resp 14 | Ht 64.0 in | Wt 183.8 lb

## 2019-12-13 DIAGNOSIS — Z8639 Personal history of other endocrine, nutritional and metabolic disease: Secondary | ICD-10-CM | POA: Diagnosis not present

## 2019-12-13 DIAGNOSIS — F5101 Primary insomnia: Secondary | ICD-10-CM

## 2019-12-13 DIAGNOSIS — M797 Fibromyalgia: Secondary | ICD-10-CM

## 2019-12-13 DIAGNOSIS — M25511 Pain in right shoulder: Secondary | ICD-10-CM | POA: Diagnosis not present

## 2019-12-13 DIAGNOSIS — M7711 Lateral epicondylitis, right elbow: Secondary | ICD-10-CM

## 2019-12-13 DIAGNOSIS — M4302 Spondylolysis, cervical region: Secondary | ICD-10-CM | POA: Diagnosis not present

## 2019-12-13 DIAGNOSIS — Z8719 Personal history of other diseases of the digestive system: Secondary | ICD-10-CM

## 2019-12-13 DIAGNOSIS — M7061 Trochanteric bursitis, right hip: Secondary | ICD-10-CM | POA: Diagnosis not present

## 2019-12-13 DIAGNOSIS — M7062 Trochanteric bursitis, left hip: Secondary | ICD-10-CM

## 2019-12-13 DIAGNOSIS — M533 Sacrococcygeal disorders, not elsewhere classified: Secondary | ICD-10-CM | POA: Diagnosis not present

## 2019-12-13 DIAGNOSIS — M19041 Primary osteoarthritis, right hand: Secondary | ICD-10-CM | POA: Diagnosis not present

## 2019-12-13 DIAGNOSIS — G4733 Obstructive sleep apnea (adult) (pediatric): Secondary | ICD-10-CM

## 2019-12-13 DIAGNOSIS — Z8679 Personal history of other diseases of the circulatory system: Secondary | ICD-10-CM | POA: Diagnosis not present

## 2019-12-13 DIAGNOSIS — Z8659 Personal history of other mental and behavioral disorders: Secondary | ICD-10-CM | POA: Diagnosis not present

## 2019-12-13 DIAGNOSIS — M19042 Primary osteoarthritis, left hand: Secondary | ICD-10-CM

## 2019-12-13 DIAGNOSIS — I2510 Atherosclerotic heart disease of native coronary artery without angina pectoris: Secondary | ICD-10-CM

## 2019-12-13 DIAGNOSIS — G8929 Other chronic pain: Secondary | ICD-10-CM

## 2019-12-13 MED ORDER — LIDOCAINE HCL 1 % IJ SOLN
1.0000 mL | INTRAMUSCULAR | Status: AC | PRN
  Administered 2019-12-13: 1 mL

## 2019-12-13 MED ORDER — TRIAMCINOLONE ACETONIDE 40 MG/ML IJ SUSP
30.0000 mg | INTRAMUSCULAR | Status: AC | PRN
  Administered 2019-12-13: 30 mg via INTRA_ARTICULAR

## 2019-12-13 NOTE — Patient Instructions (Signed)
Shoulder Exercises Ask your health care provider which exercises are safe for you. Do exercises exactly as told by your health care provider and adjust them as directed. It is normal to feel mild stretching, pulling, tightness, or discomfort as you do these exercises. Stop right away if you feel sudden pain or your pain gets worse. Do not begin these exercises until told by your health care provider. Stretching exercises External rotation and abduction This exercise is sometimes called corner stretch. This exercise rotates your arm outward (external rotation) and moves your arm out from your body (abduction). 1. Stand in a doorway with one of your feet slightly in front of the other. This is called a staggered stance. If you cannot reach your forearms to the door frame, stand facing a corner of a room. 2. Choose one of the following positions as told by your health care provider: ? Place your hands and forearms on the door frame above your head. ? Place your hands and forearms on the door frame at the height of your head. ? Place your hands on the door frame at the height of your elbows. 3. Slowly move your weight onto your front foot until you feel a stretch across your chest and in the front of your shoulders. Keep your head and chest upright and keep your abdominal muscles tight. 4. Hold for __________ seconds. 5. To release the stretch, shift your weight to your back foot. Repeat __________ times. Complete this exercise __________ times a day. Extension, standing 1. Stand and hold a broomstick, a cane, or a similar object behind your back. ? Your hands should be a little wider than shoulder width apart. ? Your palms should face away from your back. 2. Keeping your elbows straight and your shoulder muscles relaxed, move the stick away from your body until you feel a stretch in your shoulders (extension). ? Avoid shrugging your shoulders while you move the stick. Keep your shoulder blades tucked  down toward the middle of your back. 3. Hold for __________ seconds. 4. Slowly return to the starting position. Repeat __________ times. Complete this exercise __________ times a day. Range-of-motion exercises Pendulum  1. Stand near a wall or a surface that you can hold onto for balance. 2. Bend at the waist and let your left / right arm hang straight down. Use your other arm to support you. Keep your back straight and do not lock your knees. 3. Relax your left / right arm and shoulder muscles, and move your hips and your trunk so your left / right arm swings freely. Your arm should swing because of the motion of your body, not because you are using your arm or shoulder muscles. 4. Keep moving your hips and trunk so your arm swings in the following directions, as told by your health care provider: ? Side to side. ? Forward and backward. ? In clockwise and counterclockwise circles. 5. Continue each motion for __________ seconds, or for as long as told by your health care provider. 6. Slowly return to the starting position. Repeat __________ times. Complete this exercise __________ times a day. Shoulder flexion, standing  1. Stand and hold a broomstick, a cane, or a similar object. Place your hands a little more than shoulder width apart on the object. Your left / right hand should be palm up, and your other hand should be palm down. 2. Keep your elbow straight and your shoulder muscles relaxed. Push the stick up with your healthy arm to   raise your left / right arm in front of your body, and then over your head until you feel a stretch in your shoulder (flexion). ? Avoid shrugging your shoulder while you raise your arm. Keep your shoulder blade tucked down toward the middle of your back. 3. Hold for __________ seconds. 4. Slowly return to the starting position. Repeat __________ times. Complete this exercise __________ times a day. Shoulder abduction, standing 1. Stand and hold a broomstick,  a cane, or a similar object. Place your hands a little more than shoulder width apart on the object. Your left / right hand should be palm up, and your other hand should be palm down. 2. Keep your elbow straight and your shoulder muscles relaxed. Push the object across your body toward your left / right side. Raise your left / right arm to the side of your body (abduction) until you feel a stretch in your shoulder. ? Do not raise your arm above shoulder height unless your health care provider tells you to do that. ? If directed, raise your arm over your head. ? Avoid shrugging your shoulder while you raise your arm. Keep your shoulder blade tucked down toward the middle of your back. 3. Hold for __________ seconds. 4. Slowly return to the starting position. Repeat __________ times. Complete this exercise __________ times a day. Internal rotation  1. Place your left / right hand behind your back, palm up. 2. Use your other hand to dangle an exercise band, a towel, or a similar object over your shoulder. Grasp the band with your left / right hand so you are holding on to both ends. 3. Gently pull up on the band until you feel a stretch in the front of your left / right shoulder. The movement of your arm toward the center of your body is called internal rotation. ? Avoid shrugging your shoulder while you raise your arm. Keep your shoulder blade tucked down toward the middle of your back. 4. Hold for __________ seconds. 5. Release the stretch by letting go of the band and lowering your hands. Repeat __________ times. Complete this exercise __________ times a day. Strengthening exercises External rotation  1. Sit in a stable chair without armrests. 2. Secure an exercise band to a stable object at elbow height on your left / right side. 3. Place a soft object, such as a folded towel or a small pillow, between your left / right upper arm and your body to move your elbow about 4 inches (10 cm) away  from your side. 4. Hold the end of the exercise band so it is tight and there is no slack. 5. Keeping your elbow pressed against the soft object, slowly move your forearm out, away from your abdomen (external rotation). Keep your body steady so only your forearm moves. 6. Hold for __________ seconds. 7. Slowly return to the starting position. Repeat __________ times. Complete this exercise __________ times a day. Shoulder abduction  1. Sit in a stable chair without armrests, or stand up. 2. Hold a __________ weight in your left / right hand, or hold an exercise band with both hands. 3. Start with your arms straight down and your left / right palm facing in, toward your body. 4. Slowly lift your left / right hand out to your side (abduction). Do not lift your hand above shoulder height unless your health care provider tells you that this is safe. ? Keep your arms straight. ? Avoid shrugging your shoulder while you   do this movement. Keep your shoulder blade tucked down toward the middle of your back. 5. Hold for __________ seconds. 6. Slowly lower your arm, and return to the starting position. Repeat __________ times. Complete this exercise __________ times a day. Shoulder extension 1. Sit in a stable chair without armrests, or stand up. 2. Secure an exercise band to a stable object in front of you so it is at shoulder height. 3. Hold one end of the exercise band in each hand. Your palms should face each other. 4. Straighten your elbows and lift your hands up to shoulder height. 5. Step back, away from the secured end of the exercise band, until the band is tight and there is no slack. 6. Squeeze your shoulder blades together as you pull your hands down to the sides of your thighs (extension). Stop when your hands are straight down by your sides. Do not let your hands go behind your body. 7. Hold for __________ seconds. 8. Slowly return to the starting position. Repeat __________ times.  Complete this exercise __________ times a day. Shoulder row 1. Sit in a stable chair without armrests, or stand up. 2. Secure an exercise band to a stable object in front of you so it is at waist height. 3. Hold one end of the exercise band in each hand. Position your palms so that your thumbs are facing the ceiling (neutral position). 4. Bend each of your elbows to a 90-degree angle (right angle) and keep your upper arms at your sides. 5. Step back until the band is tight and there is no slack. 6. Slowly pull your elbows back behind you. 7. Hold for __________ seconds. 8. Slowly return to the starting position. Repeat __________ times. Complete this exercise __________ times a day. Shoulder press-ups  1. Sit in a stable chair that has armrests. Sit upright, with your feet flat on the floor. 2. Put your hands on the armrests so your elbows are bent and your fingers are pointing forward. Your hands should be about even with the sides of your body. 3. Push down on the armrests and use your arms to lift yourself off the chair. Straighten your elbows and lift yourself up as much as you comfortably can. ? Move your shoulder blades down, and avoid letting your shoulders move up toward your ears. ? Keep your feet on the ground. As you get stronger, your feet should support less of your body weight as you lift yourself up. 4. Hold for __________ seconds. 5. Slowly lower yourself back into the chair. Repeat __________ times. Complete this exercise __________ times a day. Wall push-ups  1. Stand so you are facing a stable wall. Your feet should be about one arm-length away from the wall. 2. Lean forward and place your palms on the wall at shoulder height. 3. Keep your feet flat on the floor as you bend your elbows and lean forward toward the wall. 4. Hold for __________ seconds. 5. Straighten your elbows to push yourself back to the starting position. Repeat __________ times. Complete this exercise  __________ times a day. This information is not intended to replace advice given to you by your health care provider. Make sure you discuss any questions you have with your health care provider. Document Revised: 11/12/2018 Document Reviewed: 08/20/2018 [employer] Patient Education  [place]. Elbow and Forearm Exercises Ask your health care provider which exercises are safe for you. Do exercises exactly as told by your health care provider and adjust  them as directed. It is normal to feel mild stretching, pulling, tightness, or discomfort as you do these exercises. Stop right away if you feel sudden pain or your pain gets worse. Do not begin these exercises until told by your health care provider. Range-of-motion exercises These exercises warm up your muscles and joints and improve the movement and flexibility of your injured elbow and forearm. The exercises also help to relieve pain, numbness, and tingling. These exercises are done using the muscles in your injured elbow and forearm (active). Elbow flexion, active 6. Hold your left / right arm at your side, and bend your elbow (flexion) as far as you can using only your arm muscles. 7. Hold this position for __________ seconds. 8. Slowly return to the starting position. Repeat __________ times. Complete this exercise __________ times a day. Elbow extension, active 5. Hold your left / right arm at your side, and straighten your elbow (extension) as much as you can using only your arm muscles. 6. Hold this position for __________ seconds. 7. Slowly return to the starting position. Repeat __________ times. Complete this exercise __________ times a day. Active forearm rotation, supination This is an exercise in which you turn (rotate) your forearm palm up (supination). 7. Stand or sit with your elbows at your sides. 8. Bend your left / right elbow to a 90-degree angle (right angle). 9. Rotate your palm up until you feel a gentle stretch on  the inside of your forearm. 10. Hold this position for __________ seconds. 11. Slowly return to the starting position. Repeat __________ times. Complete this exercise __________ times a day. Active forearm rotation, pronation This is an exercise in which you turn (rotate) your forearm palm down (pronation). 5. Stand or sit with your elbows at your sides. 6. Bend your left / right elbow to a 90-degree angle (right angle). 7. Rotate your palm down until you feel a gentle stretch on the top of your forearm. 8. Hold this position for __________ seconds. 9. Slowly return to the starting position. Repeat __________ times. Complete this exercise __________ times a day. Stretching exercises These exercises warm up your muscles and joints and improve the movement and flexibility of your injured elbow and forearm. These exercises also help to relieve pain, numbness, and tingling. These exercises are done using your healthy elbow and forearm to help stretch the muscles in your injured elbow and forearm (active-assisted). Elbow flexion, active-assisted  5. Hold your left / right arm at your side, and bend your elbow (flexion) as much as you can using your left / right arm muscles. 6. Use your other hand to bend your left / right elbow farther. To do this, gently push up on your forearm until you feel a gentle stretch on the back of your elbow. 7. Hold this position for __________ seconds. 8. Slowly return to the starting position. Repeat __________ times. Complete this exercise __________ times a day. Elbow extension, active-assisted  6. Hold your left / right arm at your side, and straighten your elbow (extension) as much as you can using your left / right arm muscles. 7. Use your other hand to straighten the left / right elbow farther. To do this, gently push down on your forearm until you feel a gentle stretch on the inside of your elbow. 8. Hold this position for __________ seconds. 9. Slowly  return to the starting position. Repeat __________ times. Complete this exercise __________ times a day. Active-assisted forearm rotation, supination This is an exercise  in which you turn (rotate) your forearm palm up (supination). 8. Sit with your left / right elbow bent in a 90-degree angle (right angle) with your forearm resting on a table. 9. Keeping your upper body and shoulder still, rotate your forearm so your palm faces upward. 10. Use your other hand to help rotate your forearm further until you feel a gentle to moderate stretch. 11. Hold this position for __________ seconds. 12. Slowly release the stretch and return to the starting position. Repeat __________ times. Complete this exercise __________ times a day. Active-assisted forearm rotation, pronation This is an exercise in which you turn (rotate) your forearm palm down (pronation). 7. Sit with your left / right elbow bent in a 90-degree angle (right angle) with your forearm resting on a table. 8. Keeping your upper body and shoulder still, rotate your forearm so your palm faces the tabletop. 9. Use your other hand to help rotate your forearm further until you feel a gentle to moderate stretch. 10. Hold this position for __________ seconds. 11. Slowly release the stretch and return to the starting position. Repeat __________ times. Complete this exercise __________ times a day. Passive elbow flexion, supine 9. Lie on your back (supine position). 10. Extend your left / right arm up in the air, bracing it with your other hand. 11. Let your left / right hand slowly lower toward your shoulder (passive flexion), while your elbow stays pointed toward the ceiling. You should feel a gentle stretch along the back of your upper arm and elbow. 12. If instructed by your health care provider, you may increase the intensity of your stretch by adding a small wrist weight or hand weight. 13. Hold this position for __________ seconds. 14. Slowly  return to the starting position. Repeat __________ times. Complete this exercise __________ times a day. Passive elbow extension, supine  9. Lie on your back (supine position). Make sure that you are in a comfortable position that lets you relax your arm muscles. 10. Place a folded towel under your left / right upper arm so your elbow and shoulder are at the same height. Straighten your left / right arm so your elbow does not rest on the bed or towel. 11. Let the weight of your hand stretch your elbow (passive extension). Keep your arm and chest muscles relaxed. You should feel a stretch on the inside of your elbow. 12. If told by your health care provider, you may increase the intensity of your stretch by adding a small wrist weight or hand weight. 13. Hold this position for __________ seconds. 14. Slowly release the stretch. Repeat __________ times. Complete this exercise __________ times a day. Strengthening exercises These exercises build strength and endurance in your elbow and forearm. Endurance is the ability to use your muscles for a long time, even after they get tired. Elbow flexion, isometric  6. Stand or sit up straight. 7. Bend your left / right elbow in a 90-degree angle (right angle), and keep your forearm at the height of your waist. Your thumb should be pointed toward the ceiling (neutral forearm). 8. Place your other hand on top of your left / right forearm. Gently push down while you resist with your left / right arm (isometric flexion). Push as hard as you can with both arms without causing any pain or movement at your left / right elbow. 9. Hold this position for __________ seconds. 10. Slowly release the tension in both arms. Let your muscles relax completely before  you repeat the exercise. Repeat __________ times. Complete this exercise __________ times a day. Elbow extension, isometric  6. Stand or sit up straight. 7. Place your left / right arm so your palm faces your  abdomen and is at the height of your waist. 8. Place your other hand on the underside of your left / right forearm. Gently push up while you resist with your left / right arm (isometric extension). Push as hard as you can with both arms without causing any pain or movement at your left / right elbow. 9. Hold this position for __________ seconds. 10. Slowly release the tension in both arms. Let your muscles relax completely before you repeat the exercise. Repeat __________ times. Complete this exercise __________ times a day. Elbow flexion with forearm palm up  1. Sit on a firm chair without armrests, or stand up. 2. Place your left / right arm at your side with your elbow straight and your palm facing forward. 3. Holding a __________weight or gripping a rubber exercise band or tubing, bend your elbow to bring your hand toward your shoulder (flexion). 4. Hold this position for __________ seconds. 5. Slowly return to the starting position. Repeat __________ times. Complete this exercise __________ times a day. Elbow extension, active  1. Sit on a firm chair without armrests, or stand up. 2. Hold a rubber exercise band or tubing in both hands. 3. Keeping your upper arms at your sides, bring both hands up to your left / right shoulder. Keep your left / right hand just below your other hand. 4. Straighten your left / right elbow (extension) while keeping your other arm still. 5. Hold this position for __________ seconds. 6. Control the resistance of the band or tubing as you return to the starting position. Repeat __________ times. Complete this exercise __________ times a day. Forearm rotation, supination  1. Sit with your left / right forearm supported on a table. Your elbow should be at waist height and bent at a 90-degree angle (right angle). 2. Gently grasp a lightweight hammer. 3. Rest your hand over the edge of the table with your palm facing down. 4. Without moving your left / right  elbow, slowly rotate your forearm to turn your palm up toward the ceiling (supination). 5. Hold this position for __________ seconds. 6. Slowly return to the starting position. Repeat __________ times. Complete this exercise __________ times a day. Forearm rotation, pronation  1. Sit with your left / right forearm supported on a table. Keep your elbow below shoulder height. 2. Gently grasp a lightweight hammer. 3. Rest your hand over the edge of the table with your palm facing up. 4. Without moving your left / right elbow, slowly rotate your forearm to turn your palm down toward the floor (pronation). 5. Hold this position for __________seconds. 6. Slowly return to the starting position. Repeat __________ times. Complete this exercise __________ times a day. This information is not intended to replace advice given to you by your health care provider. Make sure you discuss any questions you have with your health care provider. Document Revised: 11/11/2018 Document Reviewed: 08/11/2018 [employer] Patient Education  [place].

## 2019-12-15 ENCOUNTER — Other Ambulatory Visit: Payer: Self-pay

## 2019-12-15 ENCOUNTER — Ambulatory Visit: Payer: Medicare Other | Attending: Rheumatology | Admitting: Physical Therapy

## 2019-12-15 DIAGNOSIS — M25511 Pain in right shoulder: Secondary | ICD-10-CM | POA: Diagnosis not present

## 2019-12-15 DIAGNOSIS — M25521 Pain in right elbow: Secondary | ICD-10-CM | POA: Insufficient documentation

## 2019-12-15 DIAGNOSIS — M25611 Stiffness of right shoulder, not elsewhere classified: Secondary | ICD-10-CM

## 2019-12-15 DIAGNOSIS — M6281 Muscle weakness (generalized): Secondary | ICD-10-CM | POA: Diagnosis not present

## 2019-12-15 LAB — DRUG TOX MONITOR 1 W/CONF, ORAL FLD
Amphetamines: NEGATIVE ng/mL (ref <10)
Barbiturates: NEGATIVE ng/mL (ref <10)
Benzodiazepines: NEGATIVE ng/mL (ref <0.50)
Buprenorphine: NEGATIVE ng/mL (ref <0.10)
Cocaine: NEGATIVE ng/mL (ref <5.0)
Codeine: NEGATIVE ng/mL (ref <2.5)
Dihydrocodeine: 17.3 ng/mL — ABNORMAL HIGH (ref <2.5)
Fentanyl: NEGATIVE ng/mL (ref <0.10)
Heroin Metabolite: NEGATIVE ng/mL (ref <1.0)
Hydrocodone: 156 ng/mL — ABNORMAL HIGH (ref <2.5)
Hydromorphone: NEGATIVE ng/mL (ref <2.5)
MARIJUANA: NEGATIVE ng/mL (ref <2.5)
MDMA: NEGATIVE ng/mL (ref <10)
Meprobamate: NEGATIVE ng/mL (ref <2.5)
Methadone: NEGATIVE ng/mL (ref <5.0)
Morphine: NEGATIVE ng/mL (ref <2.5)
Nicotine Metabolite: NEGATIVE ng/mL (ref <5.0)
Norhydrocodone: 4.8 ng/mL — ABNORMAL HIGH (ref <2.5)
Noroxycodone: NEGATIVE ng/mL (ref <2.5)
Opiates: POSITIVE ng/mL — AB (ref <2.5)
Oxycodone: NEGATIVE ng/mL (ref <2.5)
Oxymorphone: NEGATIVE ng/mL (ref <2.5)
Phencyclidine: NEGATIVE ng/mL (ref <10)
Tapentadol: NEGATIVE ng/mL (ref <5.0)
Tramadol: NEGATIVE ng/mL (ref <5.0)
Zolpidem: NEGATIVE ng/mL (ref <5.0)

## 2019-12-15 LAB — DRUG TOX ALC METAB W/CON, ORAL FLD: Alcohol Metabolite: NEGATIVE ng/mL (ref <25)

## 2019-12-15 NOTE — Therapy (Addendum)
[facility] [address], [place], [place], [address] Phone: [phone]   Fax:  [phone]  Physical Therapy Evaluation/Discharge summary   Patient Details  Name: Kara Bush MRN: [id number] Date of Birth: 05/30/1941 Referring Provider (PT): Dr. Estanislado Pandy    Encounter Date: 12/15/2019  PT End of Session - 12/15/19 1913    Visit Number  1    Date for PT Re-Evaluation  02/09/20    Authorization Type  Medicare    PT Start Time  6962    PT Stop Time  1526    PT Time Calculation (min)  41 min    Activity Tolerance  Patient tolerated treatment well       Past Medical History:  Diagnosis Date  . Abdominal aortic aneurysm ([place])   . Anemia, improved 01/11/2013  . Basal cell carcinoma   . CAD (coronary artery disease)    hx CABG 1998, last cath 2007 with PCI and stenting to the proximal segment of the vein graft to the diagonal branch. DES Taxus was placed  . Cervicalgia   . Chest wall pain   . Chronic fatigue fibromyalgia syndrome   . Chronic pain syndrome   . Coronary artery disease   . Depression   . Fibromyalgia   . Fibromyalgia   . History of nuclear stress test 08/07/2011   lexiscan; no evidence of ischemia or infarct; low risk   . Hyperlipidemia   . Hypertension   . Hypertension   . Obstructive sleep apnea   . OSA (obstructive sleep apnea)   . PAD (peripheral artery disease) ([place])   . Pes anserine bursitis   . Pulmonary embolism ([place]) 01/11/2013  . S/P resection of aortic aneurysm   . Trochanteric bursitis   . Ulcerative colitis [facility])     Past Surgical History:  Procedure Laterality Date  . [place]  . CATARACT EXTRACTION Left 09/2019  . CHOLECYSTECTOMY  1997  . CORONARY ANGIOPLASTY WITH STENT PLACEMENT  2006   to LAD, ATRETIC LIMA AT THAT TIME ALSO  . CORONARY ANGIOPLASTY WITH STENT PLACEMENT  2007   STENT TO PROX VG-DIAG, OTHER STENTS PATENT  . CORONARY ARTERY  BYPASS GRAFT  1998   LIMA-LAD;VG-DIAG & RCA  . CORONARY STENT PLACEMENT  2005   TO LAD & LCX-STENTS,  . HERNIA REPAIR    . TUBAL LIGATION     BTL    There were no vitals filed for this visit.   Subjective Assessment - 12/15/19 1449    Subjective  3-4 month history of right elbow pain and right shoulder pain from coloring with stylus on Ipad. Had this issue previously.  Had elbow injection on 5/11.  Unloading dishwasher is painful.    Pertinent History  Fibromyalgia/CFS;  markers for RA    Patient Stated Goals  More ROM in shoulder and less pain in shoulder and elbow    Currently in Pain?  Yes    Pain Score  4     Pain Location  Shoulder    Pain Orientation  Right    Aggravating Factors   hard to adjust hearing aids from stiffness  > pain;  any elevation of arm;  unloading dishwasher;  lifting coffee pot, shift car gears    Multiple Pain Sites  Yes    Pain Score  3    Pain Location  Elbow    Pain Orientation  Right    Pain Type  Chronic pain  Aggravating Factors   grasping; lifting anything heavy; lifting dishes         [facility] PT Assessment - 12/15/19 0001      Assessment   Medical Diagnosis  right shoulder pain; right elbow pain    Referring Provider (PT)  Dr. Estanislado Pandy     Onset Date/Surgical Date  --   3- 4 months ago   Hand Dominance  Right    Next MD Visit  as needed     Prior Therapy  lots of PT but not shoulder/elbow       Precautions   Precautions  None      Restrictions   Weight Bearing Restrictions  No      Balance Screen   Has the patient fallen in the past 6 months  No    Has the patient had a decrease in activity level because of a fear of falling?   No    Is the patient reluctant to leave their home because of a fear of falling?   No      Home Social worker  Private residence    Living Arrangements  Spouse/significant other;Children;Other relatives      Prior Function   Level of Independence  Independent    Vocation  Retired     Leisure  used to [employer] ex;       Observation/Other Assessments   Focus on Therapeutic Outcomes (FOTO)   42% limitation       AROM   Overall AROM   --   supine flexion to 150 degrees   Overall AROM Comments  decreased right wrist extension     Right Shoulder Flexion  126 Degrees    Right Shoulder ABduction  88 Degrees    Right Shoulder Internal Rotation  --   T10   Right Shoulder External Rotation  28 Degrees    Left Shoulder Flexion  160 Degrees    Left Shoulder ABduction  173 Degrees    Left Shoulder Internal Rotation  --   T4   Left Shoulder External Rotation  68 Degrees    Right Elbow Flexion  --   full   Right Elbow Extension  5      Strength   Right Shoulder Flexion  3+/5    Right Shoulder ABduction  3+/5    Right Shoulder Internal Rotation  4-/5    Right Shoulder External Rotation  4-/5    Right Elbow Flexion  4/5    Right Elbow Extension  4/5    Right Hand Grip (lbs)  30    Left Hand Grip (lbs)  35      Palpation   Palpation comment  tender right forearm extensor musc      Hawkins-Kennedy test   Findings  Negative    Comments  no pain, just "stiff"      Empty Can test   Findings  Positive    Side  Right      Drop Arm test   Findings  Negative      Painful Arc of Motion   Findings  Negative                  Objective measurements completed on examination: See above findings.              PT Education - 12/15/19 1912    Education Details  Access Code: [email or web address]  supine dowel ex; wrist ext stretch; prayer stretch  Person(s) Educated  Patient    Methods  Explanation;Demonstration    Comprehension  Returned demonstration;Verbalized understanding       PT Short Term Goals - 12/15/19 1929      PT SHORT TERM GOAL #1   Title  The patient will have knowledge of initial HEP for ROM of shoulder and elbow    Time  4    Period  Weeks    Status  New    Target Date  01/12/20      PT SHORT TERM GOAL #2   Title  The patient  will have 140 degrees of right shoulder flexion, 100 degrees of abduction and external rotation to 35 degrees needed for unloading the dishwasher    Time  4    Period  Weeks    Status  New      PT SHORT TERM GOAL #3   Title  The patient will have full elbow extension and full wrist extension ROM needed for shifting gears in the car    Time  4    Period  Weeks    Status  New      PT SHORT TERM GOAL #4   Title  The patient will report a 25% improvement in shoulder and elbow pain    Time  4    Period  Weeks    Status  New        PT Long Term Goals - 12/15/19 1931      PT LONG TERM GOAL #1   Title  The patient will be independent in safe self progression of HEP    Time  8    Period  Weeks    Status  New    Target Date  02/09/20      PT LONG TERM GOAL #2   Title  The patient will have improved right shoulder flexion to 150 degrees, abduction to 115 degrees and external rotation to 40 degrees needed for unloading the dishwasher    Time  8    Period  Weeks    Status  New      PT LONG TERM GOAL #3   Title  The patient will have grossly 4/5 strength in right shoulder and elbow needed to lift/pour from the coffee pot    Time  8    Period  Weeks    Status  New      PT LONG TERM GOAL #4   Title  Right grip strength improved to 35# of force    Time  8    Period  Weeks    Status  New      PT LONG TERM GOAL #5   Title  The patient will report an overall 50% improvement in right shoulder and elbow pain    Time  8    Period  Weeks    Status  New      Additional Long Term Goals   Additional Long Term Goals  Yes      PT LONG TERM GOAL #6   Title  The patient will have improved FOTO score from 42% limitation to 36% limitation    Time  8    Period  Weeks    Status  New             Plan - 12/15/19 1911    Clinical Impression Statement  The patient reports a 3-4 month history of right shoulder and elbow pain which she attributes to excessive use  of her Ipad styllus.   She reports initially her elbow hurt more than her shoulder but after a steroid injection recently, her elbow feels much better.  Her primary complaint today is stiffness in her shoulder.  She reports difficulty unloading the dishwasher, pouring from the coffee pot and shifting gears in the car.  Gripping aggravates her elbow.  She has very limited right shoulder ROM:  flexion 126 degrees, abduction 88 degrees, external rotation to 28 degrees.  Shoulder strength grossly 3+/5 to 4-/5.  Decreased grip strength on right (dominant hand) 30#, left 35#.  Tender points in right wrist extensor muscles.  Decreased wrist extension ROM and elbow extension by 10%.  She would benefit from PT to restore shoulder ROM, improve functional strength and decrease shoulder and elbow pain.    Personal Factors and Comorbidities  Age;Comorbidity 1;Comorbidity 2    Comorbidities  Fibromyalgia; cardiac history    Examination-Activity Limitations  Reach Overhead;Lift;Carry    Examination-Participation Restrictions  Meal Prep;Cleaning    Stability/Clinical Decision Making  Stable/Uncomplicated    Clinical Decision Making  Low    Rehab Potential  Good    PT Frequency  1x / week    PT Duration  8 weeks    PT Treatment/Interventions  ADLs/Self Care Home Management;Electrical Stimulation;Iontophoresis 16m/ml Dexamethasone;Moist Heat;Ultrasound;Therapeutic exercise;Manual techniques;Dry needling;Taping;Patient/family education    PT Next Visit Plan  review supine cane ex's as needed and wrist/forearm stretches;  supine ROM progression to seated/standing positions;  manual therapy/soft tissue work to foream extensor muscles and triceps;  ionto to shoulder if cert signed (if needed) no ionto to elbow secondary to recent injection    PT Home Exercise Plan  Access Code: [email or web address]    Consulted and Agree with Plan of Care  Patient       Patient will benefit from skilled therapeutic intervention in order to improve the following deficits  and impairments:  Decreased range of motion, Increased fascial restricitons, Pain, Impaired UE functional use, Decreased strength  Visit Diagnosis: Acute pain of right shoulder - Plan: PT plan of care cert/re-cert  Pain in right elbow - Plan: PT plan of care cert/re-cert  Stiffness of right shoulder, not elsewhere classified - Plan: PT plan of care cert/re-cert  Muscle weakness (generalized) - Plan: PT plan of care cert/re-cert   PHYSICAL THERAPY DISCHARGE SUMMARY  Visits from Start of Care: 1  Current functional level related to goals / functional outcomes: The patient called to cancel all her future appts stating she would continue to do exercises on her own.     Remaining deficits: As above   Education / Equipment: Very basic HEP Plan: Patient agrees to discharge.  Patient goals were not met. Patient is being discharged due to the patient's request.  ?????         Problem List Patient Active Problem List   Diagnosis Date Noted  . Atrial fibrillation ([place] 09/09/2018  . DOE (dyspnea on exertion) 08/10/2017  . 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 Greater trochanteric bursitis of right hip 11/07/2014  . Sacro-iliac pain 11/07/2014  . Chronic midline low back pain without sciatica 11/07/2013  . Hepatitis 04/20/2013  . Screening for STD (sexually transmitted disease) 04/20/2013  . Recurrent UTI 04/20/2013  . Abdominal aortic aneurysm ([place]   . Ulcerative colitis ([place]   . Obstructive sleep apnea   . Fibromyalgia   . Coronary artery disease   . Basal cell carcinoma   . Anemia, improved 01/11/2013  . Ascending  aortic aneurysm, 4.1 cm 01/11/2013  . 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 Chronic periscapular pain 10/15/2011  . Chest wall pain   . S/P resection of aortic aneurysm, 1998 prior to CABG   . PAD (peripheral artery disease), aortic-iliac bypass 1991, mild carotid bruits   . OSA (obstructive sleep apnea)   . Chronic fatigue fibromyalgia  syndrome   . Hyperlipidemia   . Ulcerative colitis   . Depression   . CAD (coronary artery disease), CABG 1998, STENTS MULTIPLE SITES SINCE.    Marland Kitchen Hypertension    Ruben Im, PT 12/15/19 7:40 PM Phone: [phone] Fax: [phone] Alvera Singh 12/15/2019, 7:40 PM  [facility] [address], [place], [place], [address] Phone: [phone]   Fax:  [phone]  Name: Kara Bush MRN: [id number] Date of Birth: 1940/10/10

## 2019-12-15 NOTE — Patient Instructions (Signed)
Access Code: [email or web address] URL: [email or web address] Date: 12/15/2019 Prepared by: Ruben Im  Exercises Supine Shoulder Flexion Extension AAROM with Dowel - 2 x daily - 7 x weekly - 1 sets - 5 reps Supine Shoulder Abduction AAROM with Dowel - 2 x daily - 7 x weekly - 1 sets - 5 reps Supine Shoulder External Rotation AAROM with Dowel - 2 x daily - 7 x weekly - 1 sets - 5 reps Seated Wrist Flexion with Overpressure - 2 x daily - 7 x weekly - 1 sets - 3 reps - 30 hold Wrist Prayer Stretch at Table - 1 x daily - 7 x weekly - 1 sets - 3 reps - 30 hold

## 2019-12-21 ENCOUNTER — Telehealth: Payer: Self-pay | Admitting: *Deleted

## 2019-12-21 NOTE — Telephone Encounter (Signed)
Oral swab drug screen was consistent for prescribed medications.  ?

## 2019-12-23 DIAGNOSIS — I10 Essential (primary) hypertension: Secondary | ICD-10-CM | POA: Diagnosis not present

## 2019-12-23 DIAGNOSIS — E7800 Pure hypercholesterolemia, unspecified: Secondary | ICD-10-CM | POA: Diagnosis not present

## 2019-12-23 DIAGNOSIS — I2510 Atherosclerotic heart disease of native coronary artery without angina pectoris: Secondary | ICD-10-CM | POA: Diagnosis not present

## 2019-12-23 DIAGNOSIS — M1990 Unspecified osteoarthritis, unspecified site: Secondary | ICD-10-CM | POA: Diagnosis not present

## 2019-12-23 DIAGNOSIS — I4891 Unspecified atrial fibrillation: Secondary | ICD-10-CM | POA: Diagnosis not present

## 2019-12-23 DIAGNOSIS — F339 Major depressive disorder, recurrent, unspecified: Secondary | ICD-10-CM | POA: Diagnosis not present

## 2019-12-29 ENCOUNTER — Ambulatory Visit: Payer: Medicare Other

## 2019-12-30 ENCOUNTER — Other Ambulatory Visit: Payer: Self-pay | Admitting: Physical Medicine & Rehabilitation

## 2020-01-12 ENCOUNTER — Ambulatory Visit: Payer: Medicare Other | Admitting: Rheumatology

## 2020-02-02 ENCOUNTER — Encounter: Payer: Self-pay | Admitting: Registered Nurse

## 2020-02-02 ENCOUNTER — Other Ambulatory Visit: Payer: Self-pay

## 2020-02-02 ENCOUNTER — Encounter: Payer: Medicare Other | Attending: Physical Medicine and Rehabilitation | Admitting: Registered Nurse

## 2020-02-02 VITALS — BP 164/77 | HR 80 | Temp 98.4°F | Ht 64.0 in | Wt 182.4 lb

## 2020-02-02 DIAGNOSIS — M4302 Spondylolysis, cervical region: Secondary | ICD-10-CM | POA: Diagnosis not present

## 2020-02-02 DIAGNOSIS — M79672 Pain in left foot: Secondary | ICD-10-CM | POA: Insufficient documentation

## 2020-02-02 DIAGNOSIS — Z79891 Long term (current) use of opiate analgesic: Secondary | ICD-10-CM | POA: Insufficient documentation

## 2020-02-02 DIAGNOSIS — M545 Low back pain, unspecified: Secondary | ICD-10-CM

## 2020-02-02 DIAGNOSIS — I2510 Atherosclerotic heart disease of native coronary artery without angina pectoris: Secondary | ICD-10-CM

## 2020-02-02 DIAGNOSIS — Z79899 Other long term (current) drug therapy: Secondary | ICD-10-CM | POA: Insufficient documentation

## 2020-02-02 DIAGNOSIS — G894 Chronic pain syndrome: Secondary | ICD-10-CM | POA: Diagnosis not present

## 2020-02-02 DIAGNOSIS — Z5181 Encounter for therapeutic drug level monitoring: Secondary | ICD-10-CM

## 2020-02-02 DIAGNOSIS — M797 Fibromyalgia: Secondary | ICD-10-CM

## 2020-02-02 DIAGNOSIS — R5382 Chronic fatigue, unspecified: Secondary | ICD-10-CM | POA: Diagnosis not present

## 2020-02-02 DIAGNOSIS — M7061 Trochanteric bursitis, right hip: Secondary | ICD-10-CM

## 2020-02-02 DIAGNOSIS — G8929 Other chronic pain: Secondary | ICD-10-CM | POA: Diagnosis not present

## 2020-02-02 MED ORDER — HYDROCODONE-ACETAMINOPHEN 5-325 MG PO TABS: 1.0000 | ORAL_TABLET | Freq: Four times a day (QID) | ORAL | 0 refills | Status: DC | PRN

## 2020-02-02 NOTE — Progress Notes (Signed)
Subjective:    Patient ID: Kara Bush, female    DOB: Jul 18, 1941, 79 y.o.   MRN: [id number]  HPI: Kara Bush is a 79 y.o. female who returns for follow up appointment for chronic pain and medication refill. She states her pain is located in her mid- lower back pain and right hip pain. She rates her pain 4. Her current exercise regime is walking and performing stretching exercises.  Kara Bush Morphine equivalent is 23.33 MME.    Last Oral Swab was Performed on 12/08/2019, it was consistent.    Pain Inventory Average Pain 4 Pain Right Now 4 My pain is constant and aching  In the last 24 hours, has pain interfered with the following? General activity 0 Relation with others 0 Enjoyment of life 0 What TIME of day is your pain at its worst? daytime Sleep (in general) Good  Pain is worse with: bending and some activites Pain improves with: rest and medication Relief from Meds: 4  Mobility walk without assistance ability to climb steps?  yes do you drive?  yes  Function retired  Neuro/Psych No problems in this area  Prior Studies Any changes since last visit?  no  Physicians involved in your care Any changes since last visit?  no   Family History  Problem Relation Age of Onset  . Heart disease Father   . Heart attack Father        @ 44  . Cancer Paternal Aunt        BREAST  . Heart failure Mother        at 22  . Healthy Sister   . Cancer Brother        pancreatic   . Heart failure Maternal Grandmother   . Heart attack Maternal Grandfather   . Heart attack Paternal Grandmother        at 33  . Healthy Daughter   . Healthy Daughter    Social History   Socioeconomic History  . Marital status: Divorced    Spouse name: Not on file  . Number of children: 2  . Years of education: college   . Highest education level: Not on file  Occupational History    Employer: RETIRED  Tobacco Use  . Smoking status: Former Smoker    Packs/day: 2.50     Years: 20.00    Pack years: 50.00    Types: Cigarettes    Quit date: 06/29/1990    Years since quitting: 29.6  . Smokeless tobacco: Never Used  Vaping Use  . Vaping Use: Never used  Substance and Sexual Activity  . Alcohol use: No  . Drug use: No  . Sexual activity: Not on file  Other Topics Concern  . Not on file  Social History Narrative  . Not on file   Social Determinants of Health   Financial Resource Strain:   . Difficulty of Paying Living Expenses:   Food Insecurity:   . Worried About Charity fundraiser in the Last Year:   . Arboriculturist in the Last Year:   Transportation Needs:   . Film/video editor (Medical):   Marland Kitchen Lack of Transportation (Non-Medical):   Physical Activity:   . Days of Exercise per Week:   . Minutes of Exercise per Session:   Stress:   . Feeling of Stress :   Social Connections:   . Frequency of Communication with Friends and Family:   . Frequency of Social Gatherings with  Friends and Family:   . Attends Religious Services:   . Active Member of Clubs or Organizations:   . Attends Archivist Meetings:   Marland Kitchen Marital Status:    Past Surgical History:  Procedure Laterality Date  . [place]  . CATARACT EXTRACTION Left 09/2019  . CHOLECYSTECTOMY  1997  . CORONARY ANGIOPLASTY WITH STENT PLACEMENT  2006   to LAD, ATRETIC LIMA AT THAT TIME ALSO  . CORONARY ANGIOPLASTY WITH STENT PLACEMENT  2007   STENT TO PROX VG-DIAG, OTHER STENTS PATENT  . CORONARY ARTERY BYPASS GRAFT  1998   LIMA-LAD;VG-DIAG & RCA  . CORONARY STENT PLACEMENT  2005   TO LAD & LCX-STENTS,  . HERNIA REPAIR    . TUBAL LIGATION     BTL   Past Medical History:  Diagnosis Date  . Abdominal aortic aneurysm ([place])   . Anemia, improved 01/11/2013  . Basal cell carcinoma   . CAD (coronary artery disease)    hx CABG 1998, last cath 2007 with PCI and stenting to the proximal segment of the vein graft to the diagonal branch. DES Taxus was  placed  . Cervicalgia   . Chest wall pain   . Chronic fatigue fibromyalgia syndrome   . Chronic pain syndrome   . Coronary artery disease   . Depression   . Fibromyalgia   . Fibromyalgia   . History of nuclear stress test 08/07/2011   lexiscan; no evidence of ischemia or infarct; low risk   . Hyperlipidemia   . Hypertension   . Hypertension   . Obstructive sleep apnea   . OSA (obstructive sleep apnea)   . PAD (peripheral artery disease) ([place])   . Pes anserine bursitis   . Pulmonary embolism ([place]) 01/11/2013  . S/P resection of aortic aneurysm   . Trochanteric bursitis   . Ulcerative colitis ([facility])    BP (!) 164/77   Pulse 80   Temp 98.4 F (36.9 C)   Ht 5\' 4"  (1.626 m)   Wt 182 lb 6.4 oz (82.7 kg)   SpO2 93%   BMI 31.31 kg/m   Opioid Risk Score:   Fall Risk Score:  `1  Depression screen PHQ 2/9  Depression screen [facility] 2/9 02/02/2020 08/11/2019 04/13/2018 02/12/2018 12/11/2017 08/18/2017 08/21/2015  Decreased Interest 0 0 0 0 0 0 0  Down, Depressed, Hopeless 0 0 0 0 0 0 0  PHQ - 2 Score 0 0 0 0 0 0 0  Tired, decreased energy - - - - - - -  Change in appetite - - - - - - -  Feeling bad or failure about yourself  - - - - - - -  Trouble concentrating - - - - - - -  Moving slowly or fidgety/restless - - - - - - -  Suicidal thoughts - - - - - - -   Review of Systems  Constitutional: Negative.   HENT: Negative.   Eyes: Negative.   Respiratory: Negative.   Cardiovascular: Negative.   Gastrointestinal: Negative.   Endocrine: Negative.   Genitourinary: Negative.   Musculoskeletal: Negative.   Skin: Negative.   Allergic/Immunologic: Negative.   Neurological: Negative.   Hematological: Bruises/bleeds easily.       Eliquis  Psychiatric/Behavioral: Negative.   All other systems reviewed and are negative.      Objective:   Physical Exam Vitals and nursing note reviewed.  Constitutional:      Appearance: Normal appearance.  Cardiovascular:  Rate and Rhythm: Normal rate  and regular rhythm.     Pulses: Normal pulses.     Heart sounds: Normal heart sounds.  Pulmonary:     Effort: Pulmonary effort is normal.     Breath sounds: Normal breath sounds.  Musculoskeletal:     Cervical back: Normal range of motion and neck supple.  Neurological:     Mental Status: She is alert and oriented to person, place, and time.  Psychiatric:        Mood and Affect: Mood normal.        Behavior: Behavior normal.           Assessment & Plan:  1.Cervical spondylosis, cervicalgia with radiation to right scapular region: Continue to Monitor, Continue Current Medication and exercise regime.02/02/2020 2. Fibromyalgia: Continue Pool therapyonce pool opensandContinueHome Exercise Program.02/02/2020 3. Bilateral intermittent hand numbness: Carpal tunnel syndrome versus C6 radiculopathy mild:No complaints today.Continue to Monitor.02/02/2020 4.Chronic Midline Low Back Pain/LBP: Refilled:Norco 5/325mg  #140pills--use one pill every 6 hours as neededfor pain. A second prescription wase-scribefor the followingmonth.02/02/2020. We will continue the opioid monitoring program, this consists of regular clinic visits, examinations, urine drug screen, pill counts as well as use of [place] Controlled Substance Reporting System.  5. Muscle Spasm: Continuecurrent medication regimen withRobaxin.02/02/2020 6.BilateralGreater Trochanteric Bursitis:No complaints today.Continue to Alternate with heat and ice therapy. Continue current medication regime.02/02/2020 7. Left Foot pain:No complaints today.Continue with HEP as Tolerated. Continue to Monitor. 02/02/2020 8. Right Lateral Epicondylitis Pain:No complaints Today. Continue HEP as Tolerated. Continue to Monitor. 02/02/2020 9. Right Shoulder Tendonitis: No complaints Today. Continue Current Medication Regimen. Continue to Alternate Ice and Heat Therapy.   F/U in 2 months  20 minutes of face to face  patient care time was spent during this visit. All questions were encouraged and answered.

## 2020-02-08 DIAGNOSIS — F339 Major depressive disorder, recurrent, unspecified: Secondary | ICD-10-CM | POA: Diagnosis not present

## 2020-02-08 DIAGNOSIS — I4891 Unspecified atrial fibrillation: Secondary | ICD-10-CM | POA: Diagnosis not present

## 2020-02-08 DIAGNOSIS — I10 Essential (primary) hypertension: Secondary | ICD-10-CM | POA: Diagnosis not present

## 2020-02-08 DIAGNOSIS — E7800 Pure hypercholesterolemia, unspecified: Secondary | ICD-10-CM | POA: Diagnosis not present

## 2020-02-08 DIAGNOSIS — I2510 Atherosclerotic heart disease of native coronary artery without angina pectoris: Secondary | ICD-10-CM | POA: Diagnosis not present

## 2020-02-08 DIAGNOSIS — M1990 Unspecified osteoarthritis, unspecified site: Secondary | ICD-10-CM | POA: Diagnosis not present

## 2020-02-09 ENCOUNTER — Encounter: Payer: Medicare Other | Admitting: Physical Therapy

## 2020-02-23 ENCOUNTER — Encounter: Payer: Medicare Other | Admitting: Physical Therapy

## 2020-02-28 ENCOUNTER — Ambulatory Visit: Payer: Medicare Other | Admitting: Internal Medicine

## 2020-03-01 ENCOUNTER — Encounter: Payer: Medicare Other | Admitting: Physical Therapy

## 2020-03-07 DIAGNOSIS — Z85828 Personal history of other malignant neoplasm of skin: Secondary | ICD-10-CM | POA: Diagnosis not present

## 2020-03-07 DIAGNOSIS — D1801 Hemangioma of skin and subcutaneous tissue: Secondary | ICD-10-CM | POA: Diagnosis not present

## 2020-03-26 DIAGNOSIS — I2510 Atherosclerotic heart disease of native coronary artery without angina pectoris: Secondary | ICD-10-CM | POA: Diagnosis not present

## 2020-03-26 DIAGNOSIS — M1990 Unspecified osteoarthritis, unspecified site: Secondary | ICD-10-CM | POA: Diagnosis not present

## 2020-03-26 DIAGNOSIS — E7800 Pure hypercholesterolemia, unspecified: Secondary | ICD-10-CM | POA: Diagnosis not present

## 2020-03-26 DIAGNOSIS — I4891 Unspecified atrial fibrillation: Secondary | ICD-10-CM | POA: Diagnosis not present

## 2020-03-26 DIAGNOSIS — I10 Essential (primary) hypertension: Secondary | ICD-10-CM | POA: Diagnosis not present

## 2020-03-26 DIAGNOSIS — F339 Major depressive disorder, recurrent, unspecified: Secondary | ICD-10-CM | POA: Diagnosis not present

## 2020-04-04 ENCOUNTER — Encounter: Payer: Medicare Other | Attending: Physical Medicine and Rehabilitation | Admitting: Registered Nurse

## 2020-04-04 ENCOUNTER — Other Ambulatory Visit: Payer: Self-pay

## 2020-04-04 ENCOUNTER — Encounter: Payer: Self-pay | Admitting: Registered Nurse

## 2020-04-04 VITALS — BP 132/66 | HR 84 | Ht 64.0 in | Wt 182.0 lb

## 2020-04-04 DIAGNOSIS — M797 Fibromyalgia: Secondary | ICD-10-CM

## 2020-04-04 DIAGNOSIS — I2510 Atherosclerotic heart disease of native coronary artery without angina pectoris: Secondary | ICD-10-CM | POA: Diagnosis not present

## 2020-04-04 DIAGNOSIS — M7062 Trochanteric bursitis, left hip: Secondary | ICD-10-CM | POA: Diagnosis not present

## 2020-04-04 DIAGNOSIS — Z5181 Encounter for therapeutic drug level monitoring: Secondary | ICD-10-CM | POA: Diagnosis not present

## 2020-04-04 DIAGNOSIS — Z79891 Long term (current) use of opiate analgesic: Secondary | ICD-10-CM

## 2020-04-04 DIAGNOSIS — M79672 Pain in left foot: Secondary | ICD-10-CM | POA: Insufficient documentation

## 2020-04-04 DIAGNOSIS — M7061 Trochanteric bursitis, right hip: Secondary | ICD-10-CM | POA: Diagnosis not present

## 2020-04-04 DIAGNOSIS — R5382 Chronic fatigue, unspecified: Secondary | ICD-10-CM | POA: Insufficient documentation

## 2020-04-04 DIAGNOSIS — M545 Low back pain: Secondary | ICD-10-CM

## 2020-04-04 DIAGNOSIS — G8929 Other chronic pain: Secondary | ICD-10-CM

## 2020-04-04 DIAGNOSIS — G894 Chronic pain syndrome: Secondary | ICD-10-CM | POA: Diagnosis not present

## 2020-04-04 DIAGNOSIS — Z79899 Other long term (current) drug therapy: Secondary | ICD-10-CM | POA: Insufficient documentation

## 2020-04-04 DIAGNOSIS — M4302 Spondylolysis, cervical region: Secondary | ICD-10-CM | POA: Insufficient documentation

## 2020-04-04 MED ORDER — HYDROCODONE-ACETAMINOPHEN 5-325 MG PO TABS: 1.0000 | ORAL_TABLET | Freq: Four times a day (QID) | ORAL | 0 refills | Status: DC | PRN

## 2020-04-04 MED ORDER — METHOCARBAMOL 500 MG PO TABS: 500.0000 mg | ORAL_TABLET | Freq: Three times a day (TID) | ORAL | 4 refills | Status: DC | PRN

## 2020-04-04 NOTE — Progress Notes (Addendum)
Subjective:    Patient ID: Kara Bush, female    DOB: Feb 23, 1941, 79 y.o.   MRN: [id number]  HPI: Kara Bush is a 79 y.o. female whose appointment was changed to a virtual office visit to reduce the risk of exposure to the COVID-19 virus and to help Ms. Confer remain healthy and safe. The virtual visit will also provide continuity of care. Kara Bush agrees with tele-health visit and verbalizes understanding. She states her pain is located in her mid- back and lower back mainly right side  And bilateral hip pain. She rates her pain 4. Her  current exercise regime is walking and performing stretching exercises.  Ms. Sedlak Morphine equivalent is 25.00 MME.    Last Oral Swab was Performed on 12/08/2019, it was consistent.    Pain Inventory Average Pain 4 Pain Right Now 4 My pain is aching  In the last 24 hours, has pain interfered with the following? General activity 4 Relation with others 2 Enjoyment of life 2 What TIME of day is your pain at its worst? morning  Sleep (in general) Good  Pain is worse with: bending and some activites Pain improves with: rest and medication Relief from Meds: 5  Family History  Problem Relation Age of Onset  . Heart disease Father   . Heart attack Father        @ 65  . Cancer Paternal Aunt        BREAST  . Heart failure Mother        at 52  . Healthy Sister   . Cancer Brother        pancreatic   . Heart failure Maternal Grandmother   . Heart attack Maternal Grandfather   . Heart attack Paternal Grandmother        at 41  . Healthy Daughter   . Healthy Daughter    Social History   Socioeconomic History  . Marital status: Divorced    Spouse name: Not on file  . Number of children: 2  . Years of education: college   . Highest education level: Not on file  Occupational History    Employer: RETIRED  Tobacco Use  . Smoking status: Former Smoker    Packs/day: 2.50    Years: 20.00    Pack years: 50.00    Types:  Cigarettes    Quit date: 06/29/1990    Years since quitting: 29.7  . Smokeless tobacco: Never Used  Vaping Use  . Vaping Use: Never used  Substance and Sexual Activity  . Alcohol use: No  . Drug use: No  . Sexual activity: Not on file  Other Topics Concern  . Not on file  Social History Narrative  . Not on file   Social Determinants of Health   Financial Resource Strain:   . Difficulty of Paying Living Expenses: Not on file  Food Insecurity:   . Worried About Charity fundraiser in the Last Year: Not on file  . Ran Out of Food in the Last Year: Not on file  Transportation Needs:   . Lack of Transportation (Medical): Not on file  . Lack of Transportation (Non-Medical): Not on file  Physical Activity:   . Days of Exercise per Week: Not on file  . Minutes of Exercise per Session: Not on file  Stress:   . Feeling of Stress : Not on file  Social Connections:   . Frequency of Communication with Friends and Family: Not on file  .  Frequency of Social Gatherings with Friends and Family: Not on file  . Attends Religious Services: Not on file  . Active Member of Clubs or Organizations: Not on file  . Attends Archivist Meetings: Not on file  . Marital Status: Not on file   Past Surgical History:  Procedure Laterality Date  . [place]  . CATARACT EXTRACTION Left 09/2019  . CHOLECYSTECTOMY  1997  . CORONARY ANGIOPLASTY WITH STENT PLACEMENT  2006   to LAD, ATRETIC LIMA AT THAT TIME ALSO  . CORONARY ANGIOPLASTY WITH STENT PLACEMENT  2007   STENT TO PROX VG-DIAG, OTHER STENTS PATENT  . CORONARY ARTERY BYPASS GRAFT  1998   LIMA-LAD;VG-DIAG & RCA  . CORONARY STENT PLACEMENT  2005   TO LAD & LCX-STENTS,  . HERNIA REPAIR    . TUBAL LIGATION     BTL   Past Surgical History:  Procedure Laterality Date  . [place]  . CATARACT EXTRACTION Left 09/2019  . CHOLECYSTECTOMY  1997  . CORONARY ANGIOPLASTY WITH STENT PLACEMENT   2006   to LAD, ATRETIC LIMA AT THAT TIME ALSO  . CORONARY ANGIOPLASTY WITH STENT PLACEMENT  2007   STENT TO PROX VG-DIAG, OTHER STENTS PATENT  . CORONARY ARTERY BYPASS GRAFT  1998   LIMA-LAD;VG-DIAG & RCA  . CORONARY STENT PLACEMENT  2005   TO LAD & LCX-STENTS,  . HERNIA REPAIR    . TUBAL LIGATION     BTL   Past Medical History:  Diagnosis Date  . Abdominal aortic aneurysm ([place])   . Anemia, improved 01/11/2013  . Basal cell carcinoma   . CAD (coronary artery disease)    hx CABG 1998, last cath 2007 with PCI and stenting to the proximal segment of the vein graft to the diagonal branch. DES Taxus was placed  . Cervicalgia   . Chest wall pain   . Chronic fatigue fibromyalgia syndrome   . Chronic pain syndrome   . Coronary artery disease   . Depression   . Fibromyalgia   . Fibromyalgia   . History of nuclear stress test 08/07/2011   lexiscan; no evidence of ischemia or infarct; low risk   . Hyperlipidemia   . Hypertension   . Hypertension   . Obstructive sleep apnea   . OSA (obstructive sleep apnea)   . PAD (peripheral artery disease) ([place])   . Pes anserine bursitis   . Pulmonary embolism ([place]) 01/11/2013  . S/P resection of aortic aneurysm   . Trochanteric bursitis   . Ulcerative colitis ([place])    BP (!) 169/66 Comment: pt reported, virtual visit  Pulse 84 Comment: pt reported, virtual visit  Ht 5\' 4"  (1.626 m) Comment: pt reported, virtual visit  Wt 182 lb (82.6 kg) Comment: pt reported, virtual visit  BMI 31.24 kg/m   Opioid Risk Score:   Fall Risk Score:  `1  Depression screen PHQ 2/9  Depression screen [facility] 2/9 02/02/2020 08/11/2019 04/13/2018 02/12/2018 12/11/2017 08/18/2017 08/21/2015  Decreased Interest 0 0 0 0 0 0 0  Down, Depressed, Hopeless 0 0 0 0 0 0 0  PHQ - 2 Score 0 0 0 0 0 0 0  Tired, decreased energy - - - - - - -  Change in appetite - - - - - - -  Feeling bad or failure about yourself  - - - - - - -  Trouble concentrating - - - - - - -  Moving slowly  or  fidgety/restless - - - - - - -  Suicidal thoughts - - - - - - -   Review of Systems  Musculoskeletal: Positive for back pain.       Hip pain  All other systems reviewed and are negative.      Objective:   Physical Exam Vitals and nursing note reviewed.  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 Fibromyalgia:ContinueHome Exercise Program.Continue to Monitor.04/04/2020 3. Bilateral intermittent hand numbness: Carpal tunnel syndrome versus C6 radiculopathy mild:No complaints today.Continue to Monitor.04/04/2020 4.Chronic Midline Low Back Pain/LBP: Refilled:Norco 5/325mg  #140pills--use one pill every 6 hours as neededfor pain. A second prescription wase-scribefor the followingmonth.04/04/2020. We will continue the opioid monitoring program, this consists of regular clinic visits, examinations, urine drug screen, pill counts as well as use of [place] Controlled Substance Reporting system. A 12 month History has been reviewed on the [place] Controlled Substance Reporting System on 04/04/2020..  5. Muscle Spasm: Continuecurrent medication regimen withRobaxin.04/04/2020 6.BilateralGreater Trochanteric Bursitis:Continue to Alternate with heat and ice therapy. Continue current medication regime.04/04/2020 7. Left Foot pain:No complaints today.Continue with HEP as Tolerated. Continue to Monitor. 04/04/2020 8. RightLateral EpicondylitisPain:No complaints Today. Continue HEP as Tolerated. Continue to Monitor. 04/04/2020 9. Right Shoulder Tendonitis: No complaints Today. Continue Current Medication Regimen. Continue to Alternate Ice and Heat Therapy. 04/04/2020  F/U in 2 months  Tele-Health Visit: Telephone Call Established  Patient Location of Patient: In her Home Location of Provider: In the Office Total Time: 10 Minutes

## 2020-04-06 ENCOUNTER — Encounter: Payer: Self-pay | Admitting: Internal Medicine

## 2020-04-06 ENCOUNTER — Other Ambulatory Visit: Payer: Self-pay

## 2020-04-06 ENCOUNTER — Ambulatory Visit (INDEPENDENT_AMBULATORY_CARE_PROVIDER_SITE_OTHER): Payer: Medicare Other | Admitting: Internal Medicine

## 2020-04-06 VITALS — BP 140/72 | HR 83 | Ht 64.0 in | Wt 185.8 lb

## 2020-04-06 DIAGNOSIS — I10 Essential (primary) hypertension: Secondary | ICD-10-CM | POA: Diagnosis not present

## 2020-04-06 DIAGNOSIS — I2510 Atherosclerotic heart disease of native coronary artery without angina pectoris: Secondary | ICD-10-CM

## 2020-04-06 DIAGNOSIS — I4891 Unspecified atrial fibrillation: Secondary | ICD-10-CM

## 2020-04-06 DIAGNOSIS — Z951 Presence of aortocoronary bypass graft: Secondary | ICD-10-CM | POA: Diagnosis not present

## 2020-04-06 DIAGNOSIS — R002 Palpitations: Secondary | ICD-10-CM

## 2020-04-06 MED ORDER — METOPROLOL SUCCINATE ER 25 MG PO TB24: 25.0000 mg | ORAL_TABLET | Freq: Every day | ORAL | 1 refills | Status: DC

## 2020-04-06 NOTE — Patient Instructions (Signed)
Medication Instructions:  Increase TOPROL XL- to 25mg  (1 tablet daily) *If you need a refill on your cardiac medications before your next appointment, please call your pharmacy*   Lab Work: None Ordered At This Time.  If you have labs (blood work) drawn today and your tests are completely normal, you will receive your results only by: Marland Kitchen MyChart Message (if you have MyChart) OR . A paper copy in the mail If you have any lab test that is abnormal or we need to change your treatment, we will call you to review the results.   Testing/Procedures: None Ordered At This Time.   Follow-Up: At [facility], you and your health needs are our priority.  As part of our continuing mission to provide you with exceptional heart care, we have created designated Provider Care Teams.  These Care Teams include your primary Cardiologist (physician) and Advanced Practice Providers (APPs -  Physician Assistants and Nurse Practitioners) who all work together to provide you with the care you need, when you need it.  Your next appointment:   12 month(s)  The format for your next appointment:   In Person  Provider:   K. Mali Hilty, MD

## 2020-04-06 NOTE — Progress Notes (Signed)
04/06/2020   PCP: Leighton Ruff, MD  CC: No complaints  HPI: 79 year old white female with a history of coronary artery disease as well as peripheral vascular disease. She had bypass grafting in 1998 and stents in 2005 2006 and 2000 710 negative nuclear stress test in 2013 she also has peripheral arterial disease and history of aortic iliac bypass in 1991. Last ABIs were made of 2013 were normal.  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  Fortunately she was asymptomatic, she was placed on anticoagulation with the Xarelto.   She has done well since that time.  Her last visit with Dr. Debara Pickett was in February and he was planning to recheck d-dimer and if it was still elevated she would need to continue her Xarelto for total of 6 months.  Her d-dimer continues to be elevated at 0.67. Initial d-dimer in February was 1.48.  Venous Dopplers were also done in February which were negative for DVT.    Mrs. Yard just had a repeat D. dimer which was elevated at 1.48, actually slightly higher than it had been before. She tells a story that over the past several weeks she's had a urinary tract infection and started taking Bactrim for which she is more nauseated and is actually thrown up. She reports some tenderness across her epigastric area as well as the costovertebral angles. She's also had fever, chills and some sweating at night. She is planning on seeing her primary care doctor back in the office this afternoon. She denies any worsening shortness of breath or chest pain. He's had no bleeding complications on Xarelto.  Minta returns today for follow-up. She recently reports some shortness of breath with exertion. She occasionally gets twinges in her chest however it is not reminiscent of her prior angina. Nevertheless her last stress test was 2 years ago. I think a lot of her  shortness of breath is due to weight gain and minimal exercise. She does water exercise but not water aerobics.  I saw Starlyn Mcmeans today in follow-up of her stress test and lower extremity Dopplers. Unfortunately she did not get the lower extremity Dopplers. She did undergo a metabolic stress testing. This showed no circulatory limitation to exercise. The main factors in her shortness of breath seems to be obesity and some pulmonary restriction. Currently she denies any significant leg claudication.  07/03/2106  Mrs. Coppa returns today for follow-up. Overall she's feeling fairly well. She had lower chimney arterial Dopplers a year ago which showed normal ABIs. She gets some occasional pain, burning and itching in her extremities which may be related to fibromyalgia. She denies any worsening chest pain or shortness of breath with exertion.  08/10/2017  Mrs. Hollinshead was seen today in follow-up.  She says she is fairly stable with regards to her symptoms compared to last year.  She gets short of breath, when doing moderate to heavy exertion.  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  She had slight worsening of her right lower extremity ABI last year but denies any worsening claudication with exertion.  Blood pressures been well controlled at home.  She has a physical with routine blood work in 3 months.  08/17/2018  Mrs. Tramontana returns today for routine follow-up.  Is been a year since I last  saw her.  She had an echocardiogram which showed a stable dilated aortic root.  We had planned to repeat CT angiography of the aorta around this time.  She also had had lower extremity Dopplers but denies any worsening claudication.  This showed mild bilateral lower extremity arterial disease.  She reports continued exercise and does do water aerobics.  She struggles with fibromyalgia and is on long-term opiate therapy.  Labs from March 2019 showed  total cholesterol 141, HDL 50, LDL 64 and triglycerides 134.  09/09/2018  Mrs. Buening is seen today as an add-on for new A. fib.  I just saw her a couple weeks ago.  At the time she was in sinus rhythm and doing well.  Today she was in for Dopplers and was noted to be in an irregular rhythm.  EKG was performed which shows newly identified atrial fibrillation with controlled ventricular response at 79.  Subsequently she reported that she has been having some episodes of palpitations on and off however it was not clear whether this could have been related to her fibromyalgia or any other disorder.  She is also had a few episodes of presyncope.  06/23/2019  Mrs. Masci returns today for follow-up of A. fib.  Overall she is feeling well.  Her EKG today shows in sinus rhythm.  She has been tolerating Xarelto however the cost is concerning for her.  She is interested in something else.  We discussed possibly switching over to Eliquis.  Recently she held the [place] prior to colonoscopy which was uneventful and then it was restarted.  She is also noted elevated blood pressures recently.  She is only on low-dose lisinopril.  08/17/2019  Ms. Rawlinson is seen today in follow-up.  I last saw her about a couple months ago.  She returns today for follow-up of blood pressure.  I previously increased her lisinopril however she remains hypertensive.  She is done well after transitioning from Xarelto to Eliquis.  She is currently only on low-dose lisinopril 10 mg daily.  04/06/2020  Ms. Escutia returns today for follow-up.  Overall she is doing well without any specific complaints.  LDL was 63 as of March 2011.  Blood pressure today is 140/72.  She is demonstrating sinus rhythm with first-degree AV block and some PACs and PVCs on her EKG but no evidence of A. fib.  She is on Eliquis and denies any bleeding difficulty.  She does get some occasional breakthrough palpitations.  She is only on low-dose metoprolol.      Allergies  Allergen Reactions  . Lisinopril Cough  . Atenolol Other (See Comments)    colitis  . Bactrim [Sulfamethoxazole-Trimethoprim] Nausea And Vomiting  . Cymbalta [Duloxetine Hcl] Other (See Comments)    Mad pt feel spacey  . Lyrica [Pregabalin] Other (See Comments)    Made pt feel spacey  . Penicillins Rash    Current Outpatient Medications  Medication Sig Dispense Refill  . apixaban (ELIQUIS) 5 MG TABS tablet Take 1 tablet (5 mg total) by mouth 2 (two) times daily. 60 tablet 11  . b complex vitamins capsule Take 1 capsule by mouth daily.    . citalopram (CELEXA) 20 MG tablet Take 20 mg by mouth daily.     Marland Kitchen CRANBERRY PO Take by mouth daily.    . diclofenac sodium (VOLTAREN) 1 % GEL Apply topically 4 (four) times daily.    Marland Kitchen ezetimibe (ZETIA) 10 MG tablet TAKE 1 TABLET BY MOUTH EVERY DAY 90 tablet 1  .  fluorometholone (FML) 0.1 % ophthalmic suspension 1 drop every 4 (four) hours.    Marland Kitchen HYDROcodone-acetaminophen (NORCO/VICODIN) 5-325 MG tablet Take 1 tablet by mouth 4 (four) times daily as needed for moderate pain. May Take an extra 0.5 to one tablet when pain is severe. 140 tablet 0  . methocarbamol (ROBAXIN) 500 MG tablet Take 1 tablet (500 mg total) by mouth every 8 (eight) hours as needed for muscle spasms. 30 tablet 4  . metoprolol succinate (TOPROL-XL) 25 MG 24 hr tablet Take 0.5 tablets (12.5 mg total) by mouth daily. 45 tablet 1  . Multiple Vitamin (MULITIVITAMIN WITH MINERALS) TABS Take 1 tablet by mouth daily.      . simvastatin (ZOCOR) 40 MG tablet TAKE 1 TABLET BY MOUTH EVERY DAY AT 6PM 90 tablet 1  . tobramycin (TOBREX) 0.3 % ophthalmic solution every 4 (four) hours.    . valsartan (DIOVAN) 80 MG tablet Take 1 tablet (80 mg total) by mouth daily. 90 tablet 3  . zolpidem (AMBIEN) 5 MG tablet TAKE 1 TABLET BY MOUTH 1-2 AT BEDTIME AS NEEDED **MUST LAST 30 DAYS**  2   No current facility-administered medications for this visit.    Past Medical History:  Diagnosis  Date  . Abdominal aortic aneurysm ([place])   . Anemia, improved 01/11/2013  . Basal cell carcinoma   . CAD (coronary artery disease)    hx CABG 1998, last cath 2007 with PCI and stenting to the proximal segment of the vein graft to the diagonal branch. DES Taxus was placed  . Cervicalgia   . Chest wall pain   . Chronic fatigue fibromyalgia syndrome   . Chronic pain syndrome   . Coronary artery disease   . Depression   . Fibromyalgia   . Fibromyalgia   . History of nuclear stress test 08/07/2011   lexiscan; no evidence of ischemia or infarct; low risk   . Hyperlipidemia   . Hypertension   . Hypertension   . Obstructive sleep apnea   . OSA (obstructive sleep apnea)   . PAD (peripheral artery disease) ([place])   . Pes anserine bursitis   . Pulmonary embolism ([place]) 01/11/2013  . S/P resection of aortic aneurysm   . Trochanteric bursitis   . Ulcerative colitis [facility])     Past Surgical History:  Procedure Laterality Date  . [place]  . CATARACT EXTRACTION Left 09/2019  . CHOLECYSTECTOMY  1997  . CORONARY ANGIOPLASTY WITH STENT PLACEMENT  2006   to LAD, ATRETIC LIMA AT THAT TIME ALSO  . CORONARY ANGIOPLASTY WITH STENT PLACEMENT  2007   STENT TO PROX VG-DIAG, OTHER STENTS PATENT  . CORONARY ARTERY BYPASS GRAFT  1998   LIMA-LAD;VG-DIAG & RCA  . CORONARY STENT PLACEMENT  2005   TO LAD & LCX-STENTS,  . HERNIA REPAIR    . TUBAL LIGATION     BTL   EKG: Sinus rhythm with first-degree AV block and PAC's at 80-personally reviewed  ROS: Pertinent items noted in HPI and remainder of comprehensive ROS otherwise negative.  PHYSICAL EXAM BP 140/72   Pulse 83   Ht 5\' 4"  (1.626 m)   Wt 185 lb 12.8 oz (84.3 kg)   SpO2 95%   BMI 31.89 kg/m  General appearance: alert, no distress and moderately obese Neck: no carotid bruit and no JVD Lungs: clear to auscultation bilaterally Heart: irregularly irregular rhythm and systolic murmur: early systolic 2/6, blowing at 2nd  right intercostal space Abdomen: soft, non-tender; bowel sounds  normal; no masses,  no organomegaly Extremities: extremities normal, atraumatic, no cyanosis or edema Pulses: 2+ and symmetric Skin: Skin color, texture, turgor normal. No rashes or lesions Neurologic: Grossly normal Psych: Pleasant  ASSESSMENT AND PLAN Patient Active Problem List   Diagnosis Date Noted  . Atrial fibrillation ([place]) 09/09/2018  . DOE (dyspnea on exertion) 08/10/2017  . 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 Greater trochanteric bursitis of right hip 11/07/2014  . Sacro-iliac pain 11/07/2014  . Chronic midline low back pain without sciatica 11/07/2013  . Hepatitis 04/20/2013  . Screening for STD (sexually transmitted disease) 04/20/2013  . Recurrent UTI 04/20/2013  . Abdominal aortic aneurysm ([place])   . Ulcerative colitis ([place])   . Obstructive sleep apnea   . Fibromyalgia   . Coronary artery disease   . Basal cell carcinoma   . Anemia, improved 01/11/2013  . Ascending aortic aneurysm, 4.1 cm 01/11/2013  . 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 Chronic periscapular pain 10/15/2011  . Chest wall pain   . S/P resection of aortic aneurysm, 1998 prior to CABG   . PAD (peripheral artery disease), aortic-iliac bypass 1991, mild carotid bruits   . OSA (obstructive sleep apnea)   . Chronic fatigue fibromyalgia syndrome   . Hyperlipidemia   . Ulcerative colitis   . Depression   . CAD (coronary artery disease), CABG 1998, STENTS MULTIPLE SITES SINCE.    Marland Kitchen Hypertension    PLAN: Mrs. Hinesley is in a sinus rhythm today.  There is evidence for some PACs and PVCs.  She reports some breakthrough palpitations.  Blood pressure is top normal and I think would tolerate a small increase in her metoprolol from 12.5 to 25 mg daily.  No other medication changes at this time..  Follow-up annually or sooner as necessary  Pixie Casino, MD, [facility], [place] Director of  the [facility] Lipid [facility]  Card[facility]ovascular Risk Reduction [facility] Diplomate of the American Board of Clinical Lipidology Attending Cardiologist  Direct Dial: [phone]  Fax: [phone]  Website:  [email or web address]

## 2020-04-07 ENCOUNTER — Other Ambulatory Visit: Payer: Self-pay | Admitting: Internal Medicine

## 2020-04-08 ENCOUNTER — Encounter: Payer: Self-pay | Admitting: Internal Medicine

## 2020-04-19 DIAGNOSIS — Z23 Encounter for immunization: Secondary | ICD-10-CM | POA: Diagnosis not present

## 2020-04-27 DIAGNOSIS — Z23 Encounter for immunization: Secondary | ICD-10-CM | POA: Diagnosis not present

## 2020-05-29 NOTE — Progress Notes (Signed)
Office Visit Note  Patient: Kara Bush             Date of Birth: 1941-02-03           MRN: [id number]             PCP: Leighton Ruff, MD Referring: Leighton Ruff, MD Visit Date: 06/12/2020 Occupation: @GUAROCC @  Subjective:  Trochanteric bursitis of both hips   History of Present Illness: Kara Bush is a 79 y.o. female with history of osteoarthritis and fibromyalgia.  She experiences intermittent generalized myalgias and muscle tenderness due to underlying fibromyalgia.  She presents today with trochanter bursitis of both hips.  She is a side sleeper at night and experiences nocturnal pain at times.  She has difficulty walking prolonged distances due to the discomfort.  She has not been performing stretching exercises recently.  According to the patient she went to physical therapy in the past which provided temporary relief.  She has been applying Arnica cream topically for pain relief.  She has not been going to water therapy recently due to her class being canceled.  She states she has been more sedentary than she would like to be.  She had a right lateral epicondyle cortisone injection performed on 12/13/2019 which resolved her discomfort.  She is not experiencing any increased pain or stiffness in her hands at this time.  She has been taking hydrocodone as needed for pain relief.  She continues to take Ambien 5 mg a mouth at bedtime for insomnia.  Her level of fatigue has been stable recently.  She denies any recent ulcerative colitis flares. Her PCP checks DEXA.  Activities of Daily Living:  Patient reports morning stiffness for 30  minutes.   Patient Reports nocturnal pain.  Difficulty dressing/grooming: Reports Difficulty climbing stairs: Reports Difficulty getting out of chair: Reports Difficulty using hands for taps, buttons, cutlery, and/or writing: Denies  Review of Systems  Constitutional: Positive for fatigue.  HENT: Negative for mouth sores, mouth  dryness and nose dryness.   Eyes: Positive for dryness. Negative for pain and visual disturbance.  Respiratory: Negative for cough, hemoptysis, shortness of breath and difficulty breathing.   Cardiovascular: Negative for chest pain, palpitations, hypertension and swelling in legs/feet.  Gastrointestinal: Negative for blood in stool, constipation and diarrhea.  Endocrine: Negative for increased urination.  Genitourinary: Negative for painful urination.  Musculoskeletal: Positive for arthralgias, joint pain, myalgias, morning stiffness, muscle tenderness and myalgias. Negative for joint swelling and muscle weakness.  Skin: Negative for color change, pallor, rash, hair loss, nodules/bumps, skin tightness, ulcers and sensitivity to sunlight.  Allergic/Immunologic: Negative for susceptible to infections.  Neurological: Negative for dizziness, numbness, headaches and weakness.  Hematological: Negative for swollen glands.  Psychiatric/Behavioral: Negative for depressed mood and sleep disturbance. The patient is not nervous/anxious.     PMFS History:  Patient Active Problem List   Diagnosis Date Noted  . Atrial fibrillation ([place]) 09/09/2018  . DOE (dyspnea on exertion) 08/10/2017  . 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 Greater trochanteric bursitis of right hip 11/07/2014  . Sacro-iliac pain 11/07/2014  . Chronic midline low back pain without sciatica 11/07/2013  . Hepatitis 04/20/2013  . Screening for STD (sexually transmitted disease) 04/20/2013  . Recurrent UTI 04/20/2013  . Abdominal aortic aneurysm ([place])   . Ulcerative colitis ([place])   . Obstructive sleep apnea   . Fibromyalgia   . Coronary artery disease   . Basal cell carcinoma   . Anemia,  improved 01/11/2013  . Ascending aortic aneurysm, 4.1 cm 01/11/2013  . 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 Chronic periscapular pain 10/15/2011  . Chest wall pain   . S/P resection of aortic aneurysm, 1998 prior to CABG   .  PAD (peripheral artery disease), aortic-iliac bypass 1991, mild carotid bruits   . OSA (obstructive sleep apnea)   . Chronic fatigue fibromyalgia syndrome   . Hyperlipidemia   . Ulcerative colitis   . Depression   . CAD (coronary artery disease), CABG 1998, STENTS MULTIPLE SITES SINCE.    Marland Kitchen Hypertension     Past Medical History:  Diagnosis Date  . Abdominal aortic aneurysm ([place])   . Anemia, improved 01/11/2013  . Basal cell carcinoma   . CAD (coronary artery disease)    hx CABG 1998, last cath 2007 with PCI and stenting to the proximal segment of the vein graft to the diagonal branch. DES Taxus was placed  . Cervicalgia   . Chest wall pain   . Chronic fatigue fibromyalgia syndrome   . Chronic pain syndrome   . Coronary artery disease   . Depression   . Fibromyalgia   . Fibromyalgia   . History of nuclear stress test 08/07/2011   lexiscan; no evidence of ischemia or infarct; low risk   . Hyperlipidemia   . Hypertension   . Hypertension   . Obstructive sleep apnea   . OSA (obstructive sleep apnea)   . PAD (peripheral artery disease) ([place])   . Pes anserine bursitis   . Pulmonary embolism ([place]) 01/11/2013  . S/P resection of aortic aneurysm   . Trochanteric bursitis   . Ulcerative colitis ([place])     Family History  Problem Relation Age of Onset  . Heart disease Father   . Heart attack Father        @ 74  . Cancer Paternal Aunt        BREAST  . Heart failure Mother        at 29  . Healthy Sister   . Cancer Brother        pancreatic   . Heart failure Maternal Grandmother   . Heart attack Maternal Grandfather   . Heart attack Paternal Grandmother        at 21  . Healthy Daughter   . Healthy Daughter    Past Surgical History:  Procedure Laterality Date  . [place]  . CATARACT EXTRACTION Left 09/2019  . CHOLECYSTECTOMY  1997  . CORONARY ANGIOPLASTY WITH STENT PLACEMENT  2006   to LAD, ATRETIC LIMA AT THAT TIME ALSO  . CORONARY ANGIOPLASTY  WITH STENT PLACEMENT  2007   STENT TO PROX VG-DIAG, OTHER STENTS PATENT  . CORONARY ARTERY BYPASS GRAFT  1998   LIMA-LAD;VG-DIAG & RCA  . CORONARY STENT PLACEMENT  2005   TO LAD & LCX-STENTS,  . HERNIA REPAIR    . TUBAL LIGATION     BTL   Social History   Social History Narrative  . Not on file   Immunization History  Administered Date(s) Administered  . Influenza,inj,Quad PF,6+ Mos 04/20/2013, 06/01/2014  . PFIZER SARS-COV-2 Vaccination 08/18/2019, 09/11/2019, 04/27/2020     Objective: Vital Signs: BP (!) 150/73 (BP Location: Left Arm, Patient Position: Sitting, Cuff Size: Normal)   Pulse 84   Resp 15   Ht 5\' 4"  (1.626 m)   Wt 182 lb 9.6 oz (82.8 kg)   BMI 31.34 kg/m    Physical Exam Vitals and nursing  note reviewed.  Constitutional:      Appearance: She is well-developed.  HENT:     Head: Normocephalic and atraumatic.  Eyes:     Conjunctiva/sclera: Conjunctivae normal.  Abdominal:     Palpations: Abdomen is soft.  Musculoskeletal:     Cervical back: Normal range of motion.  Skin:    General: Skin is warm and dry.     Capillary Refill: Capillary refill takes less than 2 seconds.  Neurological:     Mental Status: She is alert and oriented to person, place, and time.  Psychiatric:        Behavior: Behavior normal.      Musculoskeletal Exam: C-spine, thoracic spine, lumbar spine have good range of motion with no discomfort.  No midline spinal tenderness.  She has tenderness over both SI joints.  Shoulder joints, elbow joints, wrist joints, MCPs, PIPs, DIPs have good range of motion with no synovitis.  She has mild tenderness over the lateral epicondyle of the right elbow.  She has PIP and DIP thickening consistent with osteoarthritis of both hands.  Hip joints have good range of motion with no discomfort.  Tenderness over bilateral trochanteric bursa.  Knee joints have good range of motion with no discomfort.  No warmth or effusion of knee joints noted.  Ankle joints  have good range of motion with no tenderness or inflammation.  She has tenderness over the dorsal aspect of the left foot.  No tenderness of MTP joints.  CDAI Exam: CDAI Score: -- Patient Global: --; Provider Global: -- Swollen: --; Tender: -- Joint Exam 06/12/2020   No joint exam has been documented for this visit   There is currently no information documented on the homunculus. Go to the Rheumatology activity and complete the homunculus joint exam.  Investigation: No additional findings.  Imaging: No results found.  Recent Labs: Lab Results  Component Value Date   WBC 9.3 11/20/2015   HGB 13.1 11/20/2015   PLT 253 11/20/2015   NA 141 10/13/2019   K 4.8 10/13/2019   CL 101 10/13/2019   CO2 26 10/13/2019   GLUCOSE 99 10/13/2019   BUN 18 10/13/2019   CREATININE 0.87 10/13/2019   BILITOT 0.3 10/13/2019   ALKPHOS 53 10/13/2019   AST 26 10/13/2019   ALT 23 10/13/2019   PROT 6.5 10/13/2019   ALBUMIN 4.4 10/13/2019   CALCIUM 9.6 10/13/2019   GFRAA 73 10/13/2019    Speciality Comments: No specialty comments available.  Procedures:  No procedures performed Allergies: Lisinopril, Atenolol, Bactrim [sulfamethoxazole-trimethoprim], Cymbalta [duloxetine hcl], Lyrica [pregabalin], and Penicillins   Assessment / Plan:     Visit Diagnoses: Lateral epicondylitis, right elbow - Resolved.  She had a cortisone injection performed on 12/13/19.  She has mild tenderness palpation on exam.  She is able to fully extend and flex her right elbow without discomfort.  Chronic right shoulder pain - Resolved.  She has good range of motion of the right shoulder joint on examination today with no discomfort.  No tenderness palpation noted.  Trochanteric bursitis of both hips: She has persistent discomfort and tenderness over bilateral trochanteric bursa.  Her discomfort is exacerbated by lying on her sides at night as well as walking prolonged distances.  She has been applying Arnica cream  topically and taking hydrocodone as needed for pain relief.  She has not been performing stretching exercises recently.  She has been to physical therapy in the past which provided temporary relief.  She declined a referral to  PT today.  She was strongly encouraged to perform the stretching exercises provided in her handout today.  She was advised to notify us if her discomfort persists or worsens and she can return for a cortisone injection in the future.  Primary osteoarthritis of both hands: She has PIP and DIP thickening consistent with osteoarthritis of both hands.  She has no joint tenderness or inflammation on examination.  She is able to make a complete fist bilaterally.  Cervical spondylolysis: She has good range of motion of the C-spine on exam with no discomfort.  No symptoms of radiculopathy.  Sacro-iliac pain: She has tenderness over bilateral SI joints.  She was encouraged to perform stretching exercises on a daily basis and return to water aerobics.  Fibromyalgia: She experiences intermittent myalgias and muscle tenderness due to fibromyalgia.  She has not had any recent flares.  She takes Robaxin 500 mg 1 tablet by mouth every 8 hours as needed for muscle spasms.  She presents today with trochanter bursitis of both hips.  She has not been performing stretching exercises on a daily basis.  She has been to physical therapy in the past which provided temporary relief but she declined a referral to PT at this time.  She was strongly encouraged to perform stretching exercises on a daily basis and was given a handout of these exercises to perform.  She has been using Arnica cream topically as needed for pain relief and taking hydrocodone 1 tablet by mouth every 4 hours as needed for pain relief.  She continues to have chronic fatigue which has been stable.  She has been sleeping about 7 8 hours per night after taking Ambien 5 mg 1 to 2 tablets by mouth at bedtime for insomnia.  We discussed the  importance of regular exercise and good sleep hygiene.  She was strongly encouraged to return to water aerobics.  Primary insomnia: She takes ambien 5 mg 1-2 tablets by mouth at bedtime for insomnia.   Other medical conditions are listed as follows:  History of depression  History of ulcerative colitis: She has not had a flare in several years.   History of hypertension  OSA (obstructive sleep apnea)  History of hyperlipidemia  Coronary artery disease status post CABG  History of coronary artery disease  Orders: No orders of the defined types were placed in this encounter.  No orders of the defined types were placed in this encounter.     Follow-Up Instructions: Return in about 6 months (around 12/10/2020) for Osteoarthritis, Fibromyalgia.   Kara Neas, PA-C  Note - This record has been created using Dragon software.  Chart creation errors have been sought, but may not always  have been located. Such creation errors do not reflect on  the standard of medical care.

## 2020-06-01 DIAGNOSIS — E7800 Pure hypercholesterolemia, unspecified: Secondary | ICD-10-CM | POA: Diagnosis not present

## 2020-06-01 DIAGNOSIS — F339 Major depressive disorder, recurrent, unspecified: Secondary | ICD-10-CM | POA: Diagnosis not present

## 2020-06-01 DIAGNOSIS — I4891 Unspecified atrial fibrillation: Secondary | ICD-10-CM | POA: Diagnosis not present

## 2020-06-01 DIAGNOSIS — M1990 Unspecified osteoarthritis, unspecified site: Secondary | ICD-10-CM | POA: Diagnosis not present

## 2020-06-01 DIAGNOSIS — I2510 Atherosclerotic heart disease of native coronary artery without angina pectoris: Secondary | ICD-10-CM | POA: Diagnosis not present

## 2020-06-01 DIAGNOSIS — I10 Essential (primary) hypertension: Secondary | ICD-10-CM | POA: Diagnosis not present

## 2020-06-04 ENCOUNTER — Encounter: Payer: Self-pay | Admitting: Registered Nurse

## 2020-06-04 ENCOUNTER — Other Ambulatory Visit: Payer: Self-pay

## 2020-06-04 ENCOUNTER — Encounter: Payer: Medicare Other | Attending: Physical Medicine and Rehabilitation | Admitting: Registered Nurse

## 2020-06-04 VITALS — BP 135/78 | HR 82 | Temp 98.0°F | Ht 64.0 in | Wt 182.6 lb

## 2020-06-04 DIAGNOSIS — Z79891 Long term (current) use of opiate analgesic: Secondary | ICD-10-CM | POA: Insufficient documentation

## 2020-06-04 DIAGNOSIS — M797 Fibromyalgia: Secondary | ICD-10-CM | POA: Insufficient documentation

## 2020-06-04 DIAGNOSIS — Z5181 Encounter for therapeutic drug level monitoring: Secondary | ICD-10-CM | POA: Diagnosis not present

## 2020-06-04 DIAGNOSIS — M7061 Trochanteric bursitis, right hip: Secondary | ICD-10-CM | POA: Insufficient documentation

## 2020-06-04 DIAGNOSIS — G8929 Other chronic pain: Secondary | ICD-10-CM | POA: Insufficient documentation

## 2020-06-04 DIAGNOSIS — G894 Chronic pain syndrome: Secondary | ICD-10-CM | POA: Diagnosis not present

## 2020-06-04 DIAGNOSIS — I2510 Atherosclerotic heart disease of native coronary artery without angina pectoris: Secondary | ICD-10-CM

## 2020-06-04 DIAGNOSIS — M7062 Trochanteric bursitis, left hip: Secondary | ICD-10-CM | POA: Diagnosis not present

## 2020-06-04 DIAGNOSIS — M545 Low back pain, unspecified: Secondary | ICD-10-CM | POA: Insufficient documentation

## 2020-06-04 MED ORDER — HYDROCODONE-ACETAMINOPHEN 5-325 MG PO TABS: 1.0000 | ORAL_TABLET | Freq: Four times a day (QID) | ORAL | 0 refills | Status: DC | PRN

## 2020-06-04 NOTE — Progress Notes (Signed)
Subjective:     Patient ID: Kara Bush, female   DOB: 05-07-1941, 79 y.o.   MRN: [id number]  HPI: Kara Bush is a 79 y.o. female who returns for follow up appointment for chronic pain and medication refill. She states her pain is located in her mid- lower back pain and bilateral hips. She rates her pain 6. Her current exercise regime is walking and  She was encouraged to increase her HEP as tolerated, she verbalizes understanding.   Ms. Sizer Morphine equivalent is 20.00 MME.  Oral Swab was Performed Today.   Pain Inventory Average Pain 5 Pain Right Now 6 My pain is constant and aching  In the last 24 hours, has pain interfered with the following? General activity 4 Relation with others 0 Enjoyment of life 1 What TIME of day is your pain at its worst? morning  Sleep (in general) Good  Pain is worse with: walking, bending and some activites Pain improves with: rest and medication Relief from Meds: 5  Family History  Problem Relation Age of Onset  . Heart disease Father   . Heart attack Father        @ 68  . Cancer Paternal Aunt        BREAST  . Heart failure Mother        at 43  . Healthy Sister   . Cancer Brother        pancreatic   . Heart failure Maternal Grandmother   . Heart attack Maternal Grandfather   . Heart attack Paternal Grandmother        at 19  . Healthy Daughter   . Healthy Daughter    Social History   Socioeconomic History  . Marital status: Divorced    Spouse name: Not on file  . Number of children: 2  . Years of education: college   . Highest education level: Not on file  Occupational History    Employer: RETIRED  Tobacco Use  . Smoking status: Former Smoker    Packs/day: 2.50    Years: 20.00    Pack years: 50.00    Types: Cigarettes    Quit date: 06/29/1990    Years since quitting: 29.9  . Smokeless tobacco: Never Used  Vaping Use  . Vaping Use: Never used  Substance and Sexual Activity  . Alcohol use: No  . Drug  use: No  . Sexual activity: Not on file  Other Topics Concern  . Not on file  Social History Narrative  . Not on file   Social Determinants of Health   Financial Resource Strain:   . Difficulty of Paying Living Expenses: Not on file  Food Insecurity:   . Worried About Charity fundraiser in the Last Year: Not on file  . Ran Out of Food in the Last Year: Not on file  Transportation Needs:   . Lack of Transportation (Medical): Not on file  . Lack of Transportation (Non-Medical): Not on file  Physical Activity:   . Days of Exercise per Week: Not on file  . Minutes of Exercise per Session: Not on file  Stress:   . Feeling of Stress : Not on file  Social Connections:   . Frequency of Communication with Friends and Family: Not on file  . Frequency of Social Gatherings with Friends and Family: Not on file  . Attends Religious Services: Not on file  . Active Member of Clubs or Organizations: Not on file  . Attends Club  or Organization Meetings: Not on file  . Marital Status: Not on file   Past Surgical History:  Procedure Laterality Date  . [place]  . CATARACT EXTRACTION Left 09/2019  . CHOLECYSTECTOMY  1997  . CORONARY ANGIOPLASTY WITH STENT PLACEMENT  2006   to LAD, ATRETIC LIMA AT THAT TIME ALSO  . CORONARY ANGIOPLASTY WITH STENT PLACEMENT  2007   STENT TO PROX VG-DIAG, OTHER STENTS PATENT  . CORONARY ARTERY BYPASS GRAFT  1998   LIMA-LAD;VG-DIAG & RCA  . CORONARY STENT PLACEMENT  2005   TO LAD & LCX-STENTS,  . HERNIA REPAIR    . TUBAL LIGATION     BTL   Past Surgical History:  Procedure Laterality Date  . [place]  . CATARACT EXTRACTION Left 09/2019  . CHOLECYSTECTOMY  1997  . CORONARY ANGIOPLASTY WITH STENT PLACEMENT  2006   to LAD, ATRETIC LIMA AT THAT TIME ALSO  . CORONARY ANGIOPLASTY WITH STENT PLACEMENT  2007   STENT TO PROX VG-DIAG, OTHER STENTS PATENT  . CORONARY ARTERY BYPASS GRAFT  1998    LIMA-LAD;VG-DIAG & RCA  . CORONARY STENT PLACEMENT  2005   TO LAD & LCX-STENTS,  . HERNIA REPAIR    . TUBAL LIGATION     BTL   Past Medical History:  Diagnosis Date  . Abdominal aortic aneurysm ([place])   . Anemia, improved 01/11/2013  . Basal cell carcinoma   . CAD (coronary artery disease)    hx CABG 1998, last cath 2007 with PCI and stenting to the proximal segment of the vein graft to the diagonal branch. DES Taxus was placed  . Cervicalgia   . Chest wall pain   . Chronic fatigue fibromyalgia syndrome   . Chronic pain syndrome   . Coronary artery disease   . Depression   . Fibromyalgia   . Fibromyalgia   . History of nuclear stress test 08/07/2011   lexiscan; no evidence of ischemia or infarct; low risk   . Hyperlipidemia   . Hypertension   . Hypertension   . Obstructive sleep apnea   . OSA (obstructive sleep apnea)   . PAD (peripheral artery disease) ([place])   . Pes anserine bursitis   . Pulmonary embolism ([place]) 01/11/2013  . S/P resection of aortic aneurysm   . Trochanteric bursitis   . Ulcerative colitis ([facility])    BP 135/78   Pulse 82   Temp 98 F (36.7 C)   Ht 5\' 4"  (1.626 m)   Wt 182 lb 9.6 oz (82.8 kg)   SpO2 95%   BMI 31.34 kg/m   Opioid Risk Score:   Fall Risk Score:  `1  Depression screen PHQ 2/9  Depression screen [facility] 2/9 02/02/2020 08/11/2019 04/13/2018 02/12/2018 12/11/2017 08/18/2017 08/21/2015  Decreased Interest 0 0 0 0 0 0 0  Down, Depressed, Hopeless 0 0 0 0 0 0 0  PHQ - 2 Score 0 0 0 0 0 0 0  Tired, decreased energy - - - - - - -  Change in appetite - - - - - - -  Feeling bad or failure about yourself  - - - - - - -  Trouble concentrating - - - - - - -  Moving slowly or fidgety/restless - - - - - - -  Suicidal thoughts - - - - - - -   Review of Systems  Constitutional: Negative.   HENT: Negative.   Eyes: Negative.   Respiratory:  Negative.   Cardiovascular: Negative.   Gastrointestinal: Negative.   Endocrine: Negative.   Genitourinary: Negative.    Musculoskeletal: Positive for back pain and gait problem.  Skin: Negative.   Allergic/Immunologic: Negative.   Hematological: Negative.   Psychiatric/Behavioral: Negative.   All other systems reviewed and are negative.      Objective:   Physical Exam Vitals and nursing note reviewed.  Constitutional:      Appearance: Normal appearance.  Cardiovascular:     Rate and Rhythm: Normal rate and regular rhythm.     Pulses: Normal pulses.     Heart sounds: Normal heart sounds.  Pulmonary:     Effort: Pulmonary effort is normal.     Breath sounds: Normal breath sounds.  Musculoskeletal:     Cervical back: Normal range of motion and neck supple.     Comments: Normal Muscle Bulk and Muscle Testing Reveals:  Upper Extremities: Full ROM and Muscle Strength 5/5 Thoracic Paraspinal Tenderness: T-7- T-9 Bilateral Greater Trochanter Tenderness  Lower Extremities: Full ROM and Muscle Strength 5/5 Arises from Table with Ease  Narrow Base Gait   Skin:    General: Skin is warm and dry.  Neurological:     Mental Status: She is alert and oriented to person, place, and time.  Psychiatric:        Mood and Affect: Mood normal.        Behavior: Behavior normal.    Assessment and Plan:   1.Cervical spondylosis, cervicalgia with radiation to right scapular region: Continue to Monitor, Continue Current Medication and exercise regime.04/04/2020 2. Fibromyalgia:ContinueHome Exercise Program.Continue to Monitor.06/04/2020 3. Bilateral intermittent hand numbness: Carpal tunnel syndrome versus C6 radiculopathy mild:No complaints today.Continue to Monitor.06/04/2020 4.Chronic Midline Low Back Pain/LBP: Refilled:Norco 5/325mg  #140pills--use one pill every 6 hours as neededfor pain. A second prescription wase-scribefor the followingmonth.04/04/2020. We will continue the opioid monitoring program, this consists of regular clinic visits, examinations, urine drug screen, pill counts as well  as use of [place] Controlled Substance Reporting system. A 12 month History has been reviewed on the [place] Controlled Substance Reporting System on 06/04/2020..  5. Muscle Spasm: Continuecurrent medication regimen withRobaxin.06/04/2020 6.BilateralGreater Trochanteric Bursitis:Continue to Alternate with heat and ice therapy. Continue current medication regime.06/04/2020 7. Left Foot pain:No complaints today.Continue with HEP as Tolerated. Continue to Monitor. 06/04/2020 8. RightLateral EpicondylitisPain:No complaints Today.Continue HEP as Tolerated. Continue to Monitor. 06/04/2020 9. Right Shoulder Tendonitis:No complaints Today.Continue Current Medication Regimen. Continue to Alternate Ice and Heat Therapy. 06/04/2020  F/U in 2 months  20 minutes of face to face patient care time was spent during this visit. All questions were encouraged and answered.

## 2020-06-09 LAB — DRUG TOX MONITOR 1 W/CONF, ORAL FLD
Amphetamines: NEGATIVE ng/mL (ref <10)
Barbiturates: NEGATIVE ng/mL (ref <10)
Benzodiazepines: NEGATIVE ng/mL (ref <0.50)
Buprenorphine: NEGATIVE ng/mL (ref <0.10)
Buprenorphine: NEGATIVE ng/mL (ref <0.10)
Cocaine: NEGATIVE ng/mL (ref <5.0)
Codeine: NEGATIVE ng/mL (ref <2.5)
Dihydrocodeine: 30.8 ng/mL — ABNORMAL HIGH (ref <2.5)
Fentanyl: NEGATIVE ng/mL (ref <0.10)
Heroin Metabolite: NEGATIVE ng/mL (ref <1.0)
Hydrocodone: >250 ng/mL — ABNORMAL HIGH (ref <2.5)
Hydromorphone: NEGATIVE ng/mL (ref <2.5)
MARIJUANA: NEGATIVE ng/mL (ref <2.5)
MDMA: NEGATIVE ng/mL (ref <10)
Meprobamate: NEGATIVE ng/mL (ref <2.5)
Methadone: NEGATIVE ng/mL (ref <5.0)
Morphine: NEGATIVE ng/mL (ref <2.5)
Naloxone: NEGATIVE ng/mL (ref <0.25)
Nicotine Metabolite: NEGATIVE ng/mL (ref <5.0)
Norbuprenorphine: NEGATIVE ng/mL (ref <0.50)
Norhydrocodone: 7.2 ng/mL — ABNORMAL HIGH (ref <2.5)
Noroxycodone: NEGATIVE ng/mL (ref <2.5)
Opiates: POSITIVE ng/mL — AB (ref <2.5)
Oxycodone: NEGATIVE ng/mL (ref <2.5)
Oxymorphone: NEGATIVE ng/mL (ref <2.5)
Phencyclidine: NEGATIVE ng/mL (ref <10)
Tapentadol: NEGATIVE ng/mL (ref <5.0)
Tramadol: NEGATIVE ng/mL (ref <5.0)
Zolpidem: NEGATIVE ng/mL (ref <5.0)

## 2020-06-09 LAB — DRUG TOX ALC METAB W/CON, ORAL FLD: Alcohol Metabolite: NEGATIVE ng/mL (ref <25)

## 2020-06-12 ENCOUNTER — Other Ambulatory Visit: Payer: Self-pay

## 2020-06-12 ENCOUNTER — Ambulatory Visit (INDEPENDENT_AMBULATORY_CARE_PROVIDER_SITE_OTHER): Payer: Medicare Other | Admitting: Physician Assistant

## 2020-06-12 ENCOUNTER — Encounter: Payer: Self-pay | Admitting: Physician Assistant

## 2020-06-12 VITALS — BP 150/73 | HR 84 | Resp 15 | Ht 64.0 in | Wt 182.6 lb

## 2020-06-12 DIAGNOSIS — M7061 Trochanteric bursitis, right hip: Secondary | ICD-10-CM | POA: Diagnosis not present

## 2020-06-12 DIAGNOSIS — M797 Fibromyalgia: Secondary | ICD-10-CM | POA: Diagnosis not present

## 2020-06-12 DIAGNOSIS — G4733 Obstructive sleep apnea (adult) (pediatric): Secondary | ICD-10-CM

## 2020-06-12 DIAGNOSIS — M7711 Lateral epicondylitis, right elbow: Secondary | ICD-10-CM | POA: Diagnosis not present

## 2020-06-12 DIAGNOSIS — M19041 Primary osteoarthritis, right hand: Secondary | ICD-10-CM

## 2020-06-12 DIAGNOSIS — Z8679 Personal history of other diseases of the circulatory system: Secondary | ICD-10-CM

## 2020-06-12 DIAGNOSIS — Z8719 Personal history of other diseases of the digestive system: Secondary | ICD-10-CM

## 2020-06-12 DIAGNOSIS — Z8639 Personal history of other endocrine, nutritional and metabolic disease: Secondary | ICD-10-CM

## 2020-06-12 DIAGNOSIS — F5101 Primary insomnia: Secondary | ICD-10-CM | POA: Diagnosis not present

## 2020-06-12 DIAGNOSIS — M4302 Spondylolysis, cervical region: Secondary | ICD-10-CM

## 2020-06-12 DIAGNOSIS — M533 Sacrococcygeal disorders, not elsewhere classified: Secondary | ICD-10-CM

## 2020-06-12 DIAGNOSIS — G8929 Other chronic pain: Secondary | ICD-10-CM

## 2020-06-12 DIAGNOSIS — M25511 Pain in right shoulder: Secondary | ICD-10-CM

## 2020-06-12 DIAGNOSIS — Z8659 Personal history of other mental and behavioral disorders: Secondary | ICD-10-CM | POA: Diagnosis not present

## 2020-06-12 DIAGNOSIS — I2510 Atherosclerotic heart disease of native coronary artery without angina pectoris: Secondary | ICD-10-CM

## 2020-06-12 DIAGNOSIS — M7062 Trochanteric bursitis, left hip: Secondary | ICD-10-CM

## 2020-06-12 DIAGNOSIS — M19042 Primary osteoarthritis, left hand: Secondary | ICD-10-CM

## 2020-06-12 NOTE — Patient Instructions (Addendum)
Hip Bursitis Rehab Ask your health care provider which exercises are safe for you. Do exercises exactly as told by your health care provider and adjust them as directed. It is normal to feel mild stretching, pulling, tightness, or discomfort as you do these exercises. Stop right away if you feel sudden pain or your pain gets worse. Do not begin these exercises until told by your health care provider. Stretching exercise This exercise warms up your muscles and joints and improves the movement and flexibility of your hip. This exercise also helps to relieve pain and stiffness. Iliotibial band stretch An iliotibial band is a strong band of muscle tissue that runs from the outer side of your hip to the outer side of your thigh and knee. 1. Lie on your side with your left / right leg in the top position. 2. Bend your left / right knee and grab your ankle. Stretch out your bottom arm to help you balance. 3. Slowly bring your knee back so your thigh is behind your body. 4. Slowly lower your knee toward the floor until you feel a gentle stretch on the outside of your left / right thigh. If you do not feel a stretch and your knee will not fall farther, place the heel of your other foot on top of your knee and pull your knee down toward the floor with your foot. 5. Hold this position for __________ seconds. 6. Slowly return to the starting position. Repeat __________ times. Complete this exercise __________ times a day. Strengthening exercises These exercises build strength and endurance in your hip and pelvis. Endurance is the ability to use your muscles for a long time, even after they get tired. Bridge This exercise strengthens the muscles that move your thigh backward (hip extensors). 1. Lie on your back on a firm surface with your knees bent and your feet flat on the floor. 2. Tighten your buttocks muscles and lift your buttocks off the floor until your trunk is level with your thighs. ? Do not arch  your back. ? You should feel the muscles working in your buttocks and the back of your thighs. If you do not feel these muscles, slide your feet 1-2 inches (2.5-5 cm) farther away from your buttocks. ? If this exercise is too easy, try doing it with your arms crossed over your chest. 3. Hold this position for __________ seconds. 4. Slowly lower your hips to the starting position. 5. Let your muscles relax completely after each repetition. Repeat __________ times. Complete this exercise __________ times a day. Squats This exercise strengthens the muscles in front of your thigh and knee (quadriceps). 1. Stand in front of a table, with your feet and knees pointing straight ahead. You may rest your hands on the table for balance but not for support. 2. Slowly bend your knees and lower your hips like you are going to sit in a chair. ? Keep your weight over your heels, not over your toes. ? Keep your lower legs upright so they are parallel with the table legs. ? Do not let your hips go lower than your knees. ? Do not bend lower than told by your health care provider. ? If your hip pain increases, do not bend as low. 3. Hold the squat position for __________ seconds. 4. Slowly push with your legs to return to standing. Do not use your hands to pull yourself to standing. Repeat __________ times. Complete this exercise __________ times a day. Hip hike 1. Stand   sideways on a bottom step. Stand on your left / right leg with your other foot unsupported next to the step. You can hold on to the railing or wall for balance if needed. 2. Keep your knees straight and your torso square. Then lift your left / right hip up toward the ceiling. 3. Hold this position for __________ seconds. 4. Slowly let your left / right hip lower toward the floor, past the starting position. Your foot should get closer to the floor. Do not lean or bend your knees. Repeat __________ times. Complete this exercise __________ times a  day. Single leg stand 1. Without shoes, stand near a railing or in a doorway. You may hold on to the railing or door frame as needed for balance. 2. Squeeze your left / right buttock muscles, then lift up your other foot. ? Do not let your left / right hip push out to the side. ? It is helpful to stand in front of a mirror for this exercise so you can watch your hip. 3. Hold this position for __________ seconds. Repeat __________ times. Complete this exercise __________ times a day. This information is not intended to replace advice given to you by your health care provider. Make sure you discuss any questions you have with your health care provider. Document Revised: 11/15/2018 Document Reviewed: 11/15/2018 Elsevier Patient Education  [place] Band Syndrome Rehab Ask your health care provider which exercises are safe for you. Do exercises exactly as told by your health care provider and adjust them as directed. It is normal to feel mild stretching, pulling, tightness, or discomfort as you do these exercises. Stop right away if you feel sudden pain or your pain gets significantly worse. Do not begin these exercises until told by your health care provider. Stretching and range-of-motion exercises These exercises warm up your muscles and joints and improve the movement and flexibility of your hip and pelvis. Quadriceps stretch, prone  1. Lie on your abdomen on a firm surface, such as a bed or padded floor (prone position). 2. Bend your left / right knee and reach back to hold your ankle or pant leg. If you cannot reach your ankle or pant leg, loop a belt around your foot and grab the belt instead. 3. Gently pull your heel toward your buttocks. Your knee should not slide out to the side. You should feel a stretch in the front of your thigh and knee (quadriceps). 4. Hold this position for __________ seconds. Repeat __________ times. Complete this exercise __________ times a  day. Iliotibial band stretch An iliotibial band is a strong band of muscle tissue that runs from the outer side of your hip to the outer side of your thigh and knee. 1. Lie on your side with your left / right leg in the top position. 2. Bend both of your knees and grab your left / right ankle. Stretch out your bottom arm to help you balance. 3. Slowly bring your top knee back so your thigh goes behind your trunk. 4. Slowly lower your top leg toward the floor until you feel a gentle stretch on the outside of your left / right hip and thigh. If you do not feel a stretch and your knee will not fall farther, place the heel of your other foot on top of your knee and pull your knee down toward the floor with your foot. 5. Hold this position for __________ seconds. Repeat __________ times. Complete this exercise  __________ times a day. Strengthening exercises These exercises build strength and endurance in your hip and pelvis. Endurance is the ability to use your muscles for a long time, even after they get tired. Straight leg raises, side-lying This exercise strengthens the muscles that rotate the leg at the hip and move it away from your body (hip abductors). 1. Lie on your side with your left / right leg in the top position. Lie so your head, shoulder, hip, and knee line up. You may bend your bottom knee to help you balance. 2. Roll your hips slightly forward so your hips are stacked directly over each other and your left / right knee is facing forward. 3. Tense the muscles in your outer thigh and lift your top leg 4-6 inches (10-15 cm). 4. Hold this position for __________ seconds. 5. Slowly return to the starting position. Let your muscles relax completely before doing another repetition. Repeat __________ times. Complete this exercise __________ times a day. Leg raises, prone This exercise strengthens the muscles that move the hips (hip extensors). 1. Lie on your abdomen on your bed or a firm  surface. You can put a pillow under your hips if that is more comfortable for your lower back. 2. Bend your left / right knee so your foot is straight up in the air. 3. Squeeze your buttocks muscles and lift your left / right thigh off the bed. Do not let your back arch. 4. Tense your thigh muscle as hard as you can without increasing any knee pain. 5. Hold this position for __________ seconds. 6. Slowly lower your leg to the starting position and allow it to relax completely. Repeat __________ times. Complete this exercise __________ times a day. Hip hike 1. Stand sideways on a bottom step. Stand on your left / right leg with your other foot unsupported next to the step. You can hold on to the railing or wall for balance if needed. 2. Keep your knees straight and your torso square. Then lift your left / right hip up toward the ceiling. 3. Slowly let your left / right hip lower toward the floor, past the starting position. Your foot should get closer to the floor. Do not lean or bend your knees. Repeat __________ times. Complete this exercise __________ times a day. This information is not intended to replace advice given to you by your health care provider. Make sure you discuss any questions you have with your health care provider. Document Revised: 11/11/2018 Document Reviewed: 05/12/2018 Elsevier Patient Education  [place].

## 2020-06-14 ENCOUNTER — Telehealth: Payer: Self-pay | Admitting: *Deleted

## 2020-06-14 DIAGNOSIS — I2510 Atherosclerotic heart disease of native coronary artery without angina pectoris: Secondary | ICD-10-CM | POA: Diagnosis not present

## 2020-06-14 DIAGNOSIS — I4891 Unspecified atrial fibrillation: Secondary | ICD-10-CM | POA: Diagnosis not present

## 2020-06-14 DIAGNOSIS — M1990 Unspecified osteoarthritis, unspecified site: Secondary | ICD-10-CM | POA: Diagnosis not present

## 2020-06-14 DIAGNOSIS — F339 Major depressive disorder, recurrent, unspecified: Secondary | ICD-10-CM | POA: Diagnosis not present

## 2020-06-14 DIAGNOSIS — I10 Essential (primary) hypertension: Secondary | ICD-10-CM | POA: Diagnosis not present

## 2020-06-14 DIAGNOSIS — E7800 Pure hypercholesterolemia, unspecified: Secondary | ICD-10-CM | POA: Diagnosis not present

## 2020-06-14 NOTE — Telephone Encounter (Signed)
Oral swab drug screen was consistent for prescribed medications.  ?

## 2020-06-27 ENCOUNTER — Other Ambulatory Visit: Payer: Self-pay | Admitting: Internal Medicine

## 2020-06-28 ENCOUNTER — Other Ambulatory Visit: Payer: Self-pay | Admitting: Internal Medicine

## 2020-07-16 DIAGNOSIS — H16141 Punctate keratitis, right eye: Secondary | ICD-10-CM | POA: Diagnosis not present

## 2020-07-16 DIAGNOSIS — H01009 Unspecified blepharitis unspecified eye, unspecified eyelid: Secondary | ICD-10-CM | POA: Diagnosis not present

## 2020-07-24 ENCOUNTER — Telehealth: Payer: Self-pay | Admitting: Registered Nurse

## 2020-07-24 DIAGNOSIS — M797 Fibromyalgia: Secondary | ICD-10-CM

## 2020-07-24 MED ORDER — HYDROCODONE-ACETAMINOPHEN 5-325 MG PO TABS: 1.0000 | ORAL_TABLET | Freq: Four times a day (QID) | ORAL | 0 refills | Status: DC | PRN

## 2020-07-24 NOTE — Telephone Encounter (Signed)
PMP was Reviewed: Hydrocodone e- scribed to accommodate scheduled appointment on 08/13/2020.

## 2020-08-08 DIAGNOSIS — I2510 Atherosclerotic heart disease of native coronary artery without angina pectoris: Secondary | ICD-10-CM | POA: Diagnosis not present

## 2020-08-08 DIAGNOSIS — I10 Essential (primary) hypertension: Secondary | ICD-10-CM | POA: Diagnosis not present

## 2020-08-08 DIAGNOSIS — E7800 Pure hypercholesterolemia, unspecified: Secondary | ICD-10-CM | POA: Diagnosis not present

## 2020-08-08 DIAGNOSIS — I4891 Unspecified atrial fibrillation: Secondary | ICD-10-CM | POA: Diagnosis not present

## 2020-08-08 DIAGNOSIS — M1990 Unspecified osteoarthritis, unspecified site: Secondary | ICD-10-CM | POA: Diagnosis not present

## 2020-08-08 DIAGNOSIS — F339 Major depressive disorder, recurrent, unspecified: Secondary | ICD-10-CM | POA: Diagnosis not present

## 2020-08-13 ENCOUNTER — Encounter: Payer: PPO | Attending: Physical Medicine and Rehabilitation | Admitting: Registered Nurse

## 2020-08-13 ENCOUNTER — Other Ambulatory Visit: Payer: Self-pay

## 2020-08-13 ENCOUNTER — Encounter: Payer: Self-pay | Admitting: Registered Nurse

## 2020-08-13 VITALS — BP 151/67 | HR 78 | Temp 98.5°F | Ht 64.0 in | Wt 183.0 lb

## 2020-08-13 DIAGNOSIS — M545 Low back pain, unspecified: Secondary | ICD-10-CM | POA: Diagnosis not present

## 2020-08-13 DIAGNOSIS — Z5181 Encounter for therapeutic drug level monitoring: Secondary | ICD-10-CM | POA: Insufficient documentation

## 2020-08-13 DIAGNOSIS — M778 Other enthesopathies, not elsewhere classified: Secondary | ICD-10-CM | POA: Diagnosis not present

## 2020-08-13 DIAGNOSIS — M797 Fibromyalgia: Secondary | ICD-10-CM | POA: Diagnosis not present

## 2020-08-13 DIAGNOSIS — G894 Chronic pain syndrome: Secondary | ICD-10-CM | POA: Insufficient documentation

## 2020-08-13 DIAGNOSIS — G8929 Other chronic pain: Secondary | ICD-10-CM | POA: Insufficient documentation

## 2020-08-13 DIAGNOSIS — Z79891 Long term (current) use of opiate analgesic: Secondary | ICD-10-CM | POA: Diagnosis not present

## 2020-08-13 MED ORDER — HYDROCODONE-ACETAMINOPHEN 5-325 MG PO TABS: 1.0000 | ORAL_TABLET | Freq: Four times a day (QID) | ORAL | 0 refills | Status: DC | PRN

## 2020-08-13 NOTE — Progress Notes (Signed)
Subjective:    Patient ID: Kara Bush, female    DOB: May 29, 1941, 80 y.o.   MRN: [id number]  HPI: Kara Bush is a 80 y.o. female who returns for follow up appointment for chronic pain and medication refill. She states her pain is located in her right shoulder and mid- lower back pain. Also reports generalized joint pain. She rates her pain 4. Her current exercise regime is walking and performing stretching exercises.  Kara Bush Morphine equivalent is 23.33 MME.  Last Oral Swab was Performed on 06/04/2020, it was consistent.       Pain Inventory Average Pain 4 Pain Right Now 4 My pain is aching  In the last 24 hours, has pain interfered with the following? General activity 2 Relation with others 1 Enjoyment of life 2 What TIME of day is your pain at its worst? morning  Sleep (in general) Good  Pain is worse with: bending and some activites Pain improves with: rest and medication Relief from Meds: 6  Family History  Problem Relation Age of Onset  . Heart disease Father   . Heart attack Father        @ [phone]  . Cancer Paternal Aunt        BREAST  . Heart failure Mother        at [phone]  . Healthy Sister   . Cancer Brother        pancreatic   . Heart failure Maternal Grandmother   . Heart attack Maternal Grandfather   . Heart attack Paternal Grandmother        at [phone]  . Healthy Daughter   . Healthy Daughter    Social History   Socioeconomic History  . Marital status: Divorced    Spouse name: Not on file  . Number of children: 2  . Years of education: college   . Highest education level: Not on file  Occupational History    Employer: RETIRED  Tobacco Use  . Smoking status: Former Smoker    Packs/day: 2.50    Years: 20.00    Pack years: 50.00    Types: Cigarettes    Quit date: 06/29/1990    Years since quitting: 30.1  . Smokeless tobacco: Never Used  Vaping Use  . Vaping Use: Never used  Substance and Sexual Activity  . Alcohol use: No  .  Drug use: No  . Sexual activity: Not on file  Other Topics Concern  . Not on file  Social History Narrative  . Not on file   Social Determinants of Health   Financial Resource Strain: Not on file  Food Insecurity: Not on file  Transportation Needs: Not on file  Physical Activity: Not on file  Stress: Not on file  Social Connections: Not on file   Past Surgical History:  Procedure Laterality Date  . AORTOBIEXTERNAL ILIAC BYPASS  1991  . CATARACT EXTRACTION Left 09/2019  . CHOLECYSTECTOMY  1997  . CORONARY ANGIOPLASTY WITH STENT PLACEMENT  2006   to LAD, ATRETIC LIMA AT THAT TIME ALSO  . CORONARY ANGIOPLASTY WITH STENT PLACEMENT  2007   STENT TO PROX VG-DIAG, OTHER STENTS PATENT  . CORONARY ARTERY BYPASS GRAFT  1998   LIMA-LAD;VG-DIAG & RCA  . CORONARY STENT PLACEMENT  2005   TO LAD & LCX-STENTS,  . HERNIA REPAIR    . TUBAL LIGATION     BTL   Past Surgical History:  Procedure Laterality Date  . AORTOBIEXTERNAL ILIAC BYPASS  1991  . CATARACT EXTRACTION Left 09/2019  . CHOLECYSTECTOMY  1997  . CORONARY ANGIOPLASTY WITH STENT PLACEMENT  2006   to LAD, ATRETIC LIMA AT THAT TIME ALSO  . CORONARY ANGIOPLASTY WITH STENT PLACEMENT  2007   STENT TO PROX VG-DIAG, OTHER STENTS PATENT  . CORONARY ARTERY BYPASS GRAFT  1998   LIMA-LAD;VG-DIAG & RCA  . CORONARY STENT PLACEMENT  2005   TO LAD & LCX-STENTS,  . HERNIA REPAIR    . TUBAL LIGATION     BTL   Past Medical History:  Diagnosis Date  . Abdominal aortic aneurysm ([place])   . Anemia, improved 01/11/2013  . Basal cell carcinoma   . CAD (coronary artery disease)    hx CABG 1998, last cath 2007 with PCI and stenting to the proximal segment of the vein graft to the diagonal branch. DES Taxus was placed  . Cervicalgia   . Chest wall pain   . Chronic fatigue fibromyalgia syndrome   . Chronic pain syndrome   . Coronary artery disease   . Depression   . Fibromyalgia   . Fibromyalgia   . History of nuclear stress test 08/07/2011    lexiscan; no evidence of ischemia or infarct; low risk   . Hyperlipidemia   . Hypertension   . Hypertension   . Obstructive sleep apnea   . OSA (obstructive sleep apnea)   . PAD (peripheral artery disease) ([place])   . Pes anserine bursitis   . Pulmonary embolism ([place]) 01/11/2013  . S/P resection of aortic aneurysm   . Trochanteric bursitis   . Ulcerative colitis ([facility])    BP (!) 178/81   Pulse 78   Temp 98.5 F (36.9 C)   Ht 5\' 4"  (1.626 m)   Wt 183 lb (83 kg)   SpO2 95%   BMI 31.41 kg/m   Opioid Risk Score:   Fall Risk Score:  `1  Depression screen PHQ 2/9  Depression screen [facility] 2/9 06/04/2020 02/02/2020 08/11/2019 04/13/2018 02/12/2018 12/11/2017 08/18/2017  Decreased Interest 0 0 0 0 0 0 0  Down, Depressed, Hopeless 0 0 0 0 0 0 0  PHQ - 2 Score 0 0 0 0 0 0 0  Tired, decreased energy - - - - - - -  Change in appetite - - - - - - -  Feeling bad or failure about yourself  - - - - - - -  Trouble concentrating - - - - - - -  Moving slowly or fidgety/restless - - - - - - -  Suicidal thoughts - - - - - - -    Review of Systems  Musculoskeletal: Positive for back pain.  All other systems reviewed and are negative.      Objective:   Physical Exam Vitals and nursing note reviewed.  Constitutional:      Appearance: Normal appearance.  Cardiovascular:     Rate and Rhythm: Normal rate and regular rhythm.     Pulses: Normal pulses.     Heart sounds: Normal heart sounds.  Pulmonary:     Effort: Pulmonary effort is normal.     Breath sounds: Normal breath sounds.  Musculoskeletal:     Cervical back: Normal range of motion and neck supple.     Comments: Normal Muscle Bulk and Muscle Testing Reveals: Upper Extremities: Full ROM and Muscle Strength 5/5 Right AC Joint Tenderness  Thoracic Paraspinal Tenderness: T-1-T-7 Lumbar Paraspinal Tenderness: L-4-L-5 Lower Extremities: Full ROM and Muscle Strength 5/5 Arises from  Table with ease Narrow Based  Gait   Skin:    General:  Skin is warm and dry.  Neurological:     Mental Status: She is alert and oriented to person, place, and time.  Psychiatric:        Mood and Affect: Mood normal.        Behavior: Behavior normal.           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 Fibromyalgia:ContinueHome Exercise Program.Continue to Monitor.08/13/2020 3. Bilateral intermittent hand numbness: Carpal tunnel syndrome versus C6 radiculopathy mild:No complaints today.Continue to Monitor.08/13/2020 4.Chronic Midline Low Back Pain/LBP: Refilled:Norco 5/325mg  #140pills--use one pill every 6 hours as neededfor pain. A second prescription wase-scribefor the followingmonth.08/13/2020. We will continue the opioid monitoring program, this consists of regular clinic visits, examinations, urine drug screen, pill counts as well as use of [place] Controlled Substance Reporting system. A 12 month History has been reviewed on the [place] Controlled Substance Reporting Systemon 08/13/2020..  5. Muscle Spasm: Continuecurrent medication regimen withRobaxin.08/13/2020 6.BilateralGreater Trochanteric Bursitis:No complaints today. Continue to Alternate with heat and ice therapy. Continue current medication regime.08/13/2020 7. Left Foot pain:No complaints today.Continue with HEP as Tolerated. Continue to Monitor. 08/13/2020 8. RightLateral EpicondylitisPain:No complaints Today.Continue HEP as Tolerated. Continue to Monitor. 08/13/2020 9. Right Shoulder Tendonitis:Continue Current Medication Regimen. Continue to Alternate Ice and Heat Therapy.08/13/2020  F/U in 2 months  20 minutes of face to face patient care time was spent during this visit. All questions were encouraged and answered.

## 2020-09-06 DIAGNOSIS — I10 Essential (primary) hypertension: Secondary | ICD-10-CM | POA: Diagnosis not present

## 2020-09-06 DIAGNOSIS — M1990 Unspecified osteoarthritis, unspecified site: Secondary | ICD-10-CM | POA: Diagnosis not present

## 2020-09-06 DIAGNOSIS — F339 Major depressive disorder, recurrent, unspecified: Secondary | ICD-10-CM | POA: Diagnosis not present

## 2020-09-06 DIAGNOSIS — I2510 Atherosclerotic heart disease of native coronary artery without angina pectoris: Secondary | ICD-10-CM | POA: Diagnosis not present

## 2020-09-06 DIAGNOSIS — E7800 Pure hypercholesterolemia, unspecified: Secondary | ICD-10-CM | POA: Diagnosis not present

## 2020-09-06 DIAGNOSIS — I4891 Unspecified atrial fibrillation: Secondary | ICD-10-CM | POA: Diagnosis not present

## 2020-09-10 ENCOUNTER — Ambulatory Visit
Admission: RE | Admit: 2020-09-10 | Discharge: 2020-09-10 | Disposition: A | Discharge status: Home / Self Care | Payer: Medicare Other | Source: Ambulatory Visit | Attending: Internal Medicine | Admitting: Internal Medicine

## 2020-09-10 ENCOUNTER — Other Ambulatory Visit: Payer: Self-pay

## 2020-09-10 DIAGNOSIS — K838 Other specified diseases of biliary tract: Secondary | ICD-10-CM | POA: Diagnosis not present

## 2020-09-10 DIAGNOSIS — I2510 Atherosclerotic heart disease of native coronary artery without angina pectoris: Secondary | ICD-10-CM | POA: Diagnosis not present

## 2020-09-10 DIAGNOSIS — I7121 Aneurysm of the ascending aorta, without rupture: Secondary | ICD-10-CM

## 2020-09-10 DIAGNOSIS — I712 Thoracic aortic aneurysm, without rupture: Secondary | ICD-10-CM

## 2020-09-10 DIAGNOSIS — I358 Other nonrheumatic aortic valve disorders: Secondary | ICD-10-CM | POA: Diagnosis not present

## 2020-09-10 MED ORDER — IOPAMIDOL (ISOVUE-370) INJECTION 76%
75.0000 mL | Freq: Once | INTRAVENOUS | Status: AC | PRN
  Administered 2020-09-10: 75 mL via INTRAVENOUS

## 2020-09-13 ENCOUNTER — Other Ambulatory Visit: Payer: Self-pay | Admitting: *Deleted

## 2020-09-13 DIAGNOSIS — I712 Thoracic aortic aneurysm, without rupture, unspecified: Secondary | ICD-10-CM

## 2020-10-03 ENCOUNTER — Other Ambulatory Visit: Payer: Self-pay | Admitting: Internal Medicine

## 2020-10-06 ENCOUNTER — Other Ambulatory Visit: Payer: Self-pay | Admitting: Internal Medicine

## 2020-10-12 DIAGNOSIS — H26492 Other secondary cataract, left eye: Secondary | ICD-10-CM | POA: Diagnosis not present

## 2020-10-12 DIAGNOSIS — Z961 Presence of intraocular lens: Secondary | ICD-10-CM | POA: Diagnosis not present

## 2020-10-12 DIAGNOSIS — H18593 Other hereditary corneal dystrophies, bilateral: Secondary | ICD-10-CM | POA: Diagnosis not present

## 2020-10-12 DIAGNOSIS — H18413 Arcus senilis, bilateral: Secondary | ICD-10-CM | POA: Diagnosis not present

## 2020-10-22 ENCOUNTER — Other Ambulatory Visit: Payer: Self-pay

## 2020-10-22 ENCOUNTER — Encounter: Payer: Self-pay | Admitting: Registered Nurse

## 2020-10-22 ENCOUNTER — Encounter: Payer: PPO | Attending: Physical Medicine and Rehabilitation | Admitting: Registered Nurse

## 2020-10-22 VITALS — BP 179/82 | HR 83 | Temp 98.5°F | Ht 64.0 in | Wt 185.0 lb

## 2020-10-22 DIAGNOSIS — Z5181 Encounter for therapeutic drug level monitoring: Secondary | ICD-10-CM | POA: Insufficient documentation

## 2020-10-22 DIAGNOSIS — I10 Essential (primary) hypertension: Secondary | ICD-10-CM | POA: Diagnosis not present

## 2020-10-22 DIAGNOSIS — G894 Chronic pain syndrome: Secondary | ICD-10-CM | POA: Diagnosis not present

## 2020-10-22 DIAGNOSIS — G8929 Other chronic pain: Secondary | ICD-10-CM | POA: Insufficient documentation

## 2020-10-22 DIAGNOSIS — M545 Low back pain, unspecified: Secondary | ICD-10-CM | POA: Insufficient documentation

## 2020-10-22 DIAGNOSIS — M7061 Trochanteric bursitis, right hip: Secondary | ICD-10-CM | POA: Diagnosis not present

## 2020-10-22 DIAGNOSIS — Z79891 Long term (current) use of opiate analgesic: Secondary | ICD-10-CM | POA: Insufficient documentation

## 2020-10-22 DIAGNOSIS — M797 Fibromyalgia: Secondary | ICD-10-CM | POA: Diagnosis not present

## 2020-10-22 DIAGNOSIS — M7062 Trochanteric bursitis, left hip: Secondary | ICD-10-CM | POA: Insufficient documentation

## 2020-10-22 MED ORDER — HYDROCODONE-ACETAMINOPHEN 5-325 MG PO TABS: 1.0000 | ORAL_TABLET | Freq: Four times a day (QID) | ORAL | 0 refills | Status: DC | PRN

## 2020-10-22 MED ORDER — HYDROCODONE-ACETAMINOPHEN 5-325 MG PO TABS
1.0000 | ORAL_TABLET | Freq: Four times a day (QID) | ORAL | 0 refills | Status: DC | PRN
Start: 2020-10-22 — End: 2020-10-22

## 2020-10-22 NOTE — Progress Notes (Signed)
Subjective:    Patient ID: Kara Bush, female    DOB: 24-Feb-1941, 80 y.o.   MRN: [id number]  HPI: Kara Bush is a 80 y.o. female who returns for follow up appointment for chronic pain and medication refill. She states her pain is located in her mid- lower back pain and bilateral hip pain. She rates her pain 4. Her current exercise regime is walking short distances and performing stretching exercises.  Kara Bush arrived hypertensive, she states she forgot to take her anti-hypertensive medication this morning, due to plumbing issue and a plumber was at her home this morning. . Educated on medication compliance, she verbalizes understanding.   Kara Bush Morphine equivalent is 25.00 MME.  UDS ordered today.    Pain Inventory Average Pain 5 Pain Right Now 4 My pain is constant and aching  In the last 24 hours, has pain interfered with the following? General activity 4 Relation with others 0 Enjoyment of life 2 What TIME of day is your pain at its worst? morning  Sleep (in general) Good  Pain is worse with: some activites Pain improves with: rest and medication Relief from Meds: 5  Family History  Problem Relation Age of Onset  . Heart disease Father   . Heart attack Father        @ 52  . Cancer Paternal Aunt        BREAST  . Heart failure Mother        at 21  . Healthy Sister   . Cancer Brother        pancreatic   . Heart failure Maternal Grandmother   . Heart attack Maternal Grandfather   . Heart attack Paternal Grandmother        at 65  . Healthy Daughter   . Healthy Daughter    Social History   Socioeconomic History  . Marital status: Divorced    Spouse name: Not on file  . Number of children: 2  . Years of education: college   . Highest education level: Not on file  Occupational History    Employer: RETIRED  Tobacco Use  . Smoking status: Former Smoker    Packs/day: 2.50    Years: 20.00    Pack years: 50.00    Types: Cigarettes     Quit date: 06/29/1990    Years since quitting: 30.3  . Smokeless tobacco: Never Used  Vaping Use  . Vaping Use: Never used  Substance and Sexual Activity  . Alcohol use: No  . Drug use: No  . Sexual activity: Not on file  Other Topics Concern  . Not on file  Social History Narrative  . Not on file   Social Determinants of Health   Financial Resource Strain: Not on file  Food Insecurity: Not on file  Transportation Needs: Not on file  Physical Activity: Not on file  Stress: Not on file  Social Connections: Not on file   Past Surgical History:  Procedure Laterality Date  . [place]  . CATARACT EXTRACTION Left 09/2019  . CHOLECYSTECTOMY  1997  . CORONARY ANGIOPLASTY WITH STENT PLACEMENT  2006   to LAD, ATRETIC LIMA AT THAT TIME ALSO  . CORONARY ANGIOPLASTY WITH STENT PLACEMENT  2007   STENT TO PROX VG-DIAG, OTHER STENTS PATENT  . CORONARY ARTERY BYPASS GRAFT  1998   LIMA-LAD;VG-DIAG & RCA  . CORONARY STENT PLACEMENT  2005   TO LAD & LCX-STENTS,  . HERNIA REPAIR    .  TUBAL LIGATION     BTL   Past Surgical History:  Procedure Laterality Date  . [place]  . CATARACT EXTRACTION Left 09/2019  . CHOLECYSTECTOMY  1997  . CORONARY ANGIOPLASTY WITH STENT PLACEMENT  2006   to LAD, ATRETIC LIMA AT THAT TIME ALSO  . CORONARY ANGIOPLASTY WITH STENT PLACEMENT  2007   STENT TO PROX VG-DIAG, OTHER STENTS PATENT  . CORONARY ARTERY BYPASS GRAFT  1998   LIMA-LAD;VG-DIAG & RCA  . CORONARY STENT PLACEMENT  2005   TO LAD & LCX-STENTS,  . HERNIA REPAIR    . TUBAL LIGATION     BTL   Past Medical History:  Diagnosis Date  . Abdominal aortic aneurysm ([place])   . Anemia, improved 01/11/2013  . Basal cell carcinoma   . CAD (coronary artery disease)    hx CABG 1998, last cath 2007 with PCI and stenting to the proximal segment of the vein graft to the diagonal branch. DES Taxus was placed  . Cervicalgia   . Chest wall pain   . Chronic  fatigue fibromyalgia syndrome   . Chronic pain syndrome   . Coronary artery disease   . Depression   . Fibromyalgia   . Fibromyalgia   . History of nuclear stress test 08/07/2011   lexiscan; no evidence of ischemia or infarct; low risk   . Hyperlipidemia   . Hypertension   . Hypertension   . Obstructive sleep apnea   . OSA (obstructive sleep apnea)   . PAD (peripheral artery disease) ([place])   . Pes anserine bursitis   . Pulmonary embolism ([place]) 01/11/2013  . S/P resection of aortic aneurysm   . Trochanteric bursitis   . Ulcerative colitis ([place])    There were no vitals taken for this visit.  Opioid Risk Score:   Fall Risk Score:  `1  Depression screen PHQ 2/9  Depression screen [facility] 2/9 06/04/2020 02/02/2020 08/11/2019 04/13/2018 02/12/2018 12/11/2017 08/18/2017  Decreased Interest 0 0 0 0 0 0 0  Down, Depressed, Hopeless 0 0 0 0 0 0 0  PHQ - 2 Score 0 0 0 0 0 0 0  Tired, decreased energy - - - - - - -  Change in appetite - - - - - - -  Feeling bad or failure about yourself  - - - - - - -  Trouble concentrating - - - - - - -  Moving slowly or fidgety/restless - - - - - - -  Suicidal thoughts - - - - - - -    Review of Systems  Constitutional: Negative.   HENT: Negative.   Eyes: Negative.   Respiratory: Negative.   Cardiovascular: Negative.   Gastrointestinal: Negative.   Endocrine: Negative.   Genitourinary: Negative.   Musculoskeletal: Positive for arthralgias, back pain and gait problem.  Allergic/Immunologic: Negative.   Hematological: Negative.   Psychiatric/Behavioral: Negative.   All other systems reviewed and are negative.      Objective:   Physical Exam Vitals and nursing note reviewed.  Constitutional:      Appearance: Normal appearance.  Cardiovascular:     Rate and Rhythm: Normal rate and regular rhythm.     Pulses: Normal pulses.     Heart sounds: Normal heart sounds.  Pulmonary:     Effort: Pulmonary effort is normal.     Breath sounds: Normal breath  sounds.  Musculoskeletal:     Cervical back: Normal range of motion and neck supple.  Comments: Normal Muscle Bulk and Muscle Testing Reveals:  Upper Extremities: Full ROM and Muscle Strength 5/5 Thoracic Paraspinal Tenderness: T-7-T-9 Lumbar Paraspinal Tenderness: L-4-L-5 Mainly Right Side Lower Extremities: Full ROM and Muscle Strength 5/5  Gait   Skin:    General: Skin is warm and dry.  Neurological:     Mental Status: She is alert and oriented to person, place, and time.  Psychiatric:        Mood and Affect: Mood normal.        Behavior: Behavior normal.           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 Fibromyalgia:ContinueHome Exercise Program.Continue to Monitor.10/22/2020 3. Bilateral intermittent hand numbness: Carpal tunnel syndrome versus C6 radiculopathy mild:No complaints today.Continue to Monitor.10/22/2020 4.Chronic Midline Low Back Pain/LBP: Refilled:Norco 5/325mg  #140pills--use one pill every 6 hours as neededfor pain. A second prescription wase-scribefor the followingmonth.10/22/2020. We will continue the opioid monitoring program, this consists of regular clinic visits, examinations, urine drug screen, pill counts as well as use of [place] Controlled Substance Reporting system. A 12 month History has been reviewed on the [place] Controlled Substance Reporting Systemon03/21/2022..  5. Muscle Spasm: Continuecurrent medication regimen withRobaxin.10/22/2020 6.BilateralGreater Trochanteric Bursitis:No complaints today. Continue to Alternate with heat and ice therapy. Continue current medication regime.10/22/2020 7. Left Foot pain:No complaints today.Continue with HEP as Tolerated. Continue to Monitor.10/22/2020 8. RightLateral EpicondylitisPain:No complaints Today.Continue HEP as  Tolerated. Continue to Monitor.10/22/2020 9. Right Shoulder Tendonitis:No complaints today. Continue Current Medication Regimen. Continue to Alternate Ice and Heat Therapy.10/22/2020  F/U in 2 months

## 2020-10-31 LAB — TOXASSURE SELECT,+ANTIDEPR,UR

## 2020-11-01 DIAGNOSIS — M1990 Unspecified osteoarthritis, unspecified site: Secondary | ICD-10-CM | POA: Diagnosis not present

## 2020-11-01 DIAGNOSIS — I4891 Unspecified atrial fibrillation: Secondary | ICD-10-CM | POA: Diagnosis not present

## 2020-11-01 DIAGNOSIS — I10 Essential (primary) hypertension: Secondary | ICD-10-CM | POA: Diagnosis not present

## 2020-11-01 DIAGNOSIS — I2510 Atherosclerotic heart disease of native coronary artery without angina pectoris: Secondary | ICD-10-CM | POA: Diagnosis not present

## 2020-11-01 DIAGNOSIS — E7800 Pure hypercholesterolemia, unspecified: Secondary | ICD-10-CM | POA: Diagnosis not present

## 2020-11-01 DIAGNOSIS — F339 Major depressive disorder, recurrent, unspecified: Secondary | ICD-10-CM | POA: Diagnosis not present

## 2020-11-21 DIAGNOSIS — I4891 Unspecified atrial fibrillation: Secondary | ICD-10-CM | POA: Diagnosis not present

## 2020-11-21 DIAGNOSIS — Z0000 Encounter for general adult medical examination without abnormal findings: Secondary | ICD-10-CM | POA: Diagnosis not present

## 2020-11-21 DIAGNOSIS — R7303 Prediabetes: Secondary | ICD-10-CM | POA: Diagnosis not present

## 2020-11-21 DIAGNOSIS — G8929 Other chronic pain: Secondary | ICD-10-CM | POA: Diagnosis not present

## 2020-11-21 DIAGNOSIS — M797 Fibromyalgia: Secondary | ICD-10-CM | POA: Diagnosis not present

## 2020-11-21 DIAGNOSIS — I712 Thoracic aortic aneurysm, without rupture: Secondary | ICD-10-CM | POA: Diagnosis not present

## 2020-11-21 DIAGNOSIS — E7800 Pure hypercholesterolemia, unspecified: Secondary | ICD-10-CM | POA: Diagnosis not present

## 2020-11-21 DIAGNOSIS — Z7901 Long term (current) use of anticoagulants: Secondary | ICD-10-CM | POA: Diagnosis not present

## 2020-11-21 DIAGNOSIS — F339 Major depressive disorder, recurrent, unspecified: Secondary | ICD-10-CM | POA: Diagnosis not present

## 2020-11-21 DIAGNOSIS — I10 Essential (primary) hypertension: Secondary | ICD-10-CM | POA: Diagnosis not present

## 2020-11-21 DIAGNOSIS — G479 Sleep disorder, unspecified: Secondary | ICD-10-CM | POA: Diagnosis not present

## 2020-11-21 DIAGNOSIS — B353 Tinea pedis: Secondary | ICD-10-CM | POA: Diagnosis not present

## 2020-11-22 DIAGNOSIS — I4891 Unspecified atrial fibrillation: Secondary | ICD-10-CM | POA: Diagnosis not present

## 2020-11-22 DIAGNOSIS — F339 Major depressive disorder, recurrent, unspecified: Secondary | ICD-10-CM | POA: Diagnosis not present

## 2020-11-22 DIAGNOSIS — I2510 Atherosclerotic heart disease of native coronary artery without angina pectoris: Secondary | ICD-10-CM | POA: Diagnosis not present

## 2020-11-22 DIAGNOSIS — I10 Essential (primary) hypertension: Secondary | ICD-10-CM | POA: Diagnosis not present

## 2020-11-22 DIAGNOSIS — G8929 Other chronic pain: Secondary | ICD-10-CM | POA: Diagnosis not present

## 2020-11-22 DIAGNOSIS — E7800 Pure hypercholesterolemia, unspecified: Secondary | ICD-10-CM | POA: Diagnosis not present

## 2020-11-22 DIAGNOSIS — M1990 Unspecified osteoarthritis, unspecified site: Secondary | ICD-10-CM | POA: Diagnosis not present

## 2020-11-26 NOTE — Progress Notes (Signed)
Office Visit Note  Patient: Kara Bush             Date of Birth: 1941-04-13           MRN: [id number]             PCP: Aretta Nip, MD Referring: Leighton Ruff, MD Visit Date: 12/10/2020 Occupation: @GUAROCC @  Subjective:  Pain in joints and muscles.   History of Present Illness: Kara Bush is a 80 y.o. female with history of osteoarthritis neck and fibromyalgia syndrome.  She continues to have some discomfort in her right shoulder joint.  Right elbow epicondylitis resolved after the injection.  She has some stiffness in her hands.  She also has ongoing lower back discomfort.  The trochanteric bursitis comes and goes.  She has generalized pain from fibromyalgia.  She has been also experiencing increased fatigue.  She used to go for [employer] but she has not gone that in a while.  Activities of Daily Living:  Patient reports morning stiffness for 2-3 hours.   Patient Reports nocturnal pain.  Difficulty dressing/grooming: Reports Difficulty climbing stairs: Reports Difficulty getting out of chair: Reports Difficulty using hands for taps, buttons, cutlery, and/or writing: Denies  Review of Systems  Constitutional: Positive for fatigue.  HENT: Negative for mouth sores, mouth dryness and nose dryness.   Eyes: Positive for dryness. Negative for pain and itching.  Respiratory: Negative for shortness of breath and difficulty breathing.   Cardiovascular: Negative for chest pain and palpitations.  Gastrointestinal: Negative for blood in stool, constipation and diarrhea.  Endocrine: Negative for increased urination.  Genitourinary: Negative for difficulty urinating.  Musculoskeletal: Positive for arthralgias, joint pain, myalgias, morning stiffness, muscle tenderness and myalgias. Negative for joint swelling.  Skin: Negative for color change, rash and redness.  Allergic/Immunologic: Negative for susceptible to infections.  Neurological: Negative for  dizziness, numbness, headaches and memory loss.  Hematological: Negative for bruising/bleeding tendency.  Psychiatric/Behavioral: Negative for confusion.    PMFS History:  Patient Active Problem List   Diagnosis Date Noted  . Atrial fibrillation ([place]) 09/09/2018  . DOE (dyspnea on exertion) 08/10/2017  . 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 Greater trochanteric bursitis of right hip 11/07/2014  . Sacro-iliac pain 11/07/2014  . Chronic midline low back pain without sciatica 11/07/2013  . Hepatitis 04/20/2013  . Screening for STD (sexually transmitted disease) 04/20/2013  . Recurrent UTI 04/20/2013  . Abdominal aortic aneurysm ([place])   . Ulcerative colitis ([place])   . Obstructive sleep apnea   . Fibromyalgia   . Coronary artery disease   . Basal cell carcinoma   . Anemia, improved 01/11/2013  . Ascending aortic aneurysm, 4.1 cm 01/11/2013  . 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 Chronic periscapular pain 10/15/2011  . Chest wall pain   . S/P resection of aortic aneurysm, 1998 prior to CABG   . PAD (peripheral artery disease), aortic-iliac bypass 1991, mild carotid bruits   . OSA (obstructive sleep apnea)   . Chronic fatigue fibromyalgia syndrome   . Hyperlipidemia   . Ulcerative colitis   . Depression   . CAD (coronary artery disease), CABG 1998, STENTS MULTIPLE SITES SINCE.    Marland Kitchen Hypertension     Past Medical History:  Diagnosis Date  . Abdominal aortic aneurysm ([place])   . Anemia, improved 01/11/2013  . Basal cell carcinoma   . CAD (coronary artery disease)    hx CABG 1998, last cath 2007 with PCI and stenting to  the proximal segment of the vein graft to the diagonal branch. DES Taxus was placed  . Cervicalgia   . Chest wall pain   . Chronic fatigue fibromyalgia syndrome   . Chronic pain syndrome   . Coronary artery disease   . Depression   . Fibromyalgia   . Fibromyalgia   . History of nuclear stress test 08/07/2011   lexiscan; no evidence of ischemia or  infarct; low risk   . Hyperlipidemia   . Hypertension   . Hypertension   . Obstructive sleep apnea   . OSA (obstructive sleep apnea)   . PAD (peripheral artery disease) ([place])   . Pes anserine bursitis   . Pulmonary embolism ([place]) 01/11/2013  . S/P resection of aortic aneurysm   . Trochanteric bursitis   . Ulcerative colitis ([place])     Family History  Problem Relation Age of Onset  . Heart disease Father   . Heart attack Father        @ 44  . Cancer Paternal Aunt        BREAST  . Heart failure Mother        at 63  . Healthy Sister   . Cancer Brother        pancreatic   . Heart failure Maternal Grandmother   . Heart attack Maternal Grandfather   . Heart attack Paternal Grandmother        at 43  . Healthy Daughter   . Healthy Daughter    Past Surgical History:  Procedure Laterality Date  . [place]  . CATARACT EXTRACTION Left 09/2019  . CHOLECYSTECTOMY  1997  . CORONARY ANGIOPLASTY WITH STENT PLACEMENT  2006   to LAD, ATRETIC LIMA AT THAT TIME ALSO  . CORONARY ANGIOPLASTY WITH STENT PLACEMENT  2007   STENT TO PROX VG-DIAG, OTHER STENTS PATENT  . CORONARY ARTERY BYPASS GRAFT  1998   LIMA-LAD;VG-DIAG & RCA  . CORONARY STENT PLACEMENT  2005   TO LAD & LCX-STENTS,  . HERNIA REPAIR    . TUBAL LIGATION     BTL   Social History   Social History Narrative  . Not on file   Immunization History  Administered Date(s) Administered  . Influenza,inj,Quad PF,6+ Mos 04/20/2013, 06/01/2014  . PFIZER(Purple Top)SARS-COV-2 Vaccination 08/18/2019, 09/11/2019, 04/27/2020     Objective: Vital Signs: BP (!) 149/78 (BP Location: Left Arm, Patient Position: Sitting, Cuff Size: Normal)   Pulse 96   Resp 16   Ht 5\' 4"  (1.626 m)   Wt 185 lb (83.9 kg)   BMI 31.76 kg/m    Physical Exam Vitals and nursing note reviewed.  Constitutional:      Appearance: She is well-developed.  HENT:     Head: Normocephalic and atraumatic.  Eyes:      Conjunctiva/sclera: Conjunctivae normal.  Cardiovascular:     Rate and Rhythm: Normal rate and regular rhythm.     Heart sounds: Normal heart sounds.  Pulmonary:     Effort: Pulmonary effort is normal.     Breath sounds: Normal breath sounds.  Abdominal:     General: Bowel sounds are normal.     Palpations: Abdomen is soft.  Musculoskeletal:     Cervical back: Normal range of motion.  Lymphadenopathy:     Cervical: No cervical adenopathy.  Skin:    General: Skin is warm and dry.     Capillary Refill: Capillary refill takes less than 2 seconds.  Neurological:  Mental Status: She is alert and oriented to person, place, and time.  Psychiatric:        Behavior: Behavior normal.      Musculoskeletal Exam: C-spine was in limited range of motion.  She had discomfort in her lower lumbar region especially over the right SI joint.  She had limited range of motion of her shoulder joints and has stiffness.  Elbow joints and wrist joints with good range of motion.  She has bilateral PIP and DIP thickening with no synovitis.  Hip joints and knee joints with good range of motion.  There is no tenderness over ankles or MTPs.  CDAI Exam: CDAI Score: -- Patient Global: --; Provider Global: -- Swollen: --; Tender: -- Joint Exam 12/10/2020   No joint exam has been documented for this visit   There is currently no information documented on the homunculus. Go to the Rheumatology activity and complete the homunculus joint exam.  Investigation: No additional findings.  Imaging: No results found.  Recent Labs: Lab Results  Component Value Date   WBC 9.3 11/20/2015   HGB 13.1 11/20/2015   PLT 253 11/20/2015   NA 141 10/13/2019   K 4.8 10/13/2019   CL 101 10/13/2019   CO2 26 10/13/2019   GLUCOSE 99 10/13/2019   BUN 18 10/13/2019   CREATININE 0.87 10/13/2019   BILITOT 0.3 10/13/2019   ALKPHOS 53 10/13/2019   AST 26 10/13/2019   ALT 23 10/13/2019   PROT 6.5 10/13/2019   ALBUMIN 4.4  10/13/2019   CALCIUM 9.6 10/13/2019   GFRAA 73 10/13/2019    Speciality Comments: No specialty comments available.  Procedures:  No procedures performed Allergies: Lisinopril, Atenolol, Bactrim [sulfamethoxazole-trimethoprim], Cymbalta [duloxetine hcl], Lyrica [pregabalin], and Penicillins   Assessment / Plan:     Visit Diagnoses: Chronic right shoulder pain -she continues to have on and off stiffness in her shoulder joints.  She has some difficulty raising her arms.  Lateral epicondylitis, right elbow - Resolved.  She had a cortisone injection performed on 12/13/19.   Primary osteoarthritis of both hands-she has osteoarthritis in her bilateral hands.  Joint protection muscle strengthening was discussed.  Trochanteric bursitis of both hips-she is off-and-on issues with trochanteric bursitis which is currently not bothersome.  Cervical spondylolysis-she has a stiffness of range of motion of her cervical spine.  Sacro-iliac pain-she had right SI joint tenderness.  SI joint stretches were demonstrated in the office.  She is to watch aerobics which she quit doing after the pandemic.  I have encouraged her to join the water aerobics classes again.  Fibromyalgia-she continues to have some generalized pain and discomfort from fibromyalgia.  I believe her fibromyalgia symptoms will improve after joining water aerobics.  Primary insomnia - ambien 5 mg 1-2 tablets by mouth at bedtime for insomnia.   History of depression-she is on Celexa.  History of hypertension-blood pressure is mildly elevated today.  Other medical problems are listed as follows:  Coronary artery disease status post CABG  History of hyperlipidemia  History of ulcerative colitis  OSA (obstructive sleep apnea)  Osteoporosis screening-she gets DEXA through her PCP.  I do not have results available.   Orders: No orders of the defined types were placed in this encounter.  No orders of the defined types were placed  in this encounter.     Follow-Up Instructions: Return in about 6 months (around 06/12/2021) for Osteoarthritis.   Bo Merino, MD  Note - This record has been created using Editor, commissioning.  Chart creation errors have been sought, but may not always  have been located. Such creation errors do not reflect on  the standard of medical care.

## 2020-12-10 ENCOUNTER — Other Ambulatory Visit: Payer: Self-pay

## 2020-12-10 ENCOUNTER — Ambulatory Visit: Payer: Medicare Other | Admitting: Rheumatology

## 2020-12-10 ENCOUNTER — Encounter: Payer: Self-pay | Admitting: Rheumatology

## 2020-12-10 VITALS — BP 149/78 | HR 96 | Resp 16 | Ht 64.0 in | Wt 185.0 lb

## 2020-12-10 DIAGNOSIS — M533 Sacrococcygeal disorders, not elsewhere classified: Secondary | ICD-10-CM

## 2020-12-10 DIAGNOSIS — G4733 Obstructive sleep apnea (adult) (pediatric): Secondary | ICD-10-CM

## 2020-12-10 DIAGNOSIS — M7711 Lateral epicondylitis, right elbow: Secondary | ICD-10-CM | POA: Diagnosis not present

## 2020-12-10 DIAGNOSIS — Z8719 Personal history of other diseases of the digestive system: Secondary | ICD-10-CM

## 2020-12-10 DIAGNOSIS — M4302 Spondylolysis, cervical region: Secondary | ICD-10-CM | POA: Diagnosis not present

## 2020-12-10 DIAGNOSIS — M19042 Primary osteoarthritis, left hand: Secondary | ICD-10-CM

## 2020-12-10 DIAGNOSIS — M7061 Trochanteric bursitis, right hip: Secondary | ICD-10-CM | POA: Diagnosis not present

## 2020-12-10 DIAGNOSIS — M19041 Primary osteoarthritis, right hand: Secondary | ICD-10-CM

## 2020-12-10 DIAGNOSIS — Z8679 Personal history of other diseases of the circulatory system: Secondary | ICD-10-CM | POA: Diagnosis not present

## 2020-12-10 DIAGNOSIS — Z13820 Encounter for screening for osteoporosis: Secondary | ICD-10-CM

## 2020-12-10 DIAGNOSIS — M25511 Pain in right shoulder: Secondary | ICD-10-CM

## 2020-12-10 DIAGNOSIS — Z8659 Personal history of other mental and behavioral disorders: Secondary | ICD-10-CM

## 2020-12-10 DIAGNOSIS — G8929 Other chronic pain: Secondary | ICD-10-CM

## 2020-12-10 DIAGNOSIS — I2510 Atherosclerotic heart disease of native coronary artery without angina pectoris: Secondary | ICD-10-CM

## 2020-12-10 DIAGNOSIS — Z8639 Personal history of other endocrine, nutritional and metabolic disease: Secondary | ICD-10-CM

## 2020-12-10 DIAGNOSIS — M7062 Trochanteric bursitis, left hip: Secondary | ICD-10-CM

## 2020-12-10 DIAGNOSIS — F5101 Primary insomnia: Secondary | ICD-10-CM | POA: Diagnosis not present

## 2020-12-10 DIAGNOSIS — M797 Fibromyalgia: Secondary | ICD-10-CM | POA: Diagnosis not present

## 2020-12-24 ENCOUNTER — Encounter: Payer: PPO | Attending: Physical Medicine and Rehabilitation | Admitting: Registered Nurse

## 2020-12-24 ENCOUNTER — Other Ambulatory Visit: Payer: Self-pay

## 2020-12-24 DIAGNOSIS — M7061 Trochanteric bursitis, right hip: Secondary | ICD-10-CM

## 2020-12-24 DIAGNOSIS — Z79891 Long term (current) use of opiate analgesic: Secondary | ICD-10-CM | POA: Diagnosis not present

## 2020-12-24 DIAGNOSIS — G894 Chronic pain syndrome: Secondary | ICD-10-CM

## 2020-12-24 DIAGNOSIS — M25562 Pain in left knee: Secondary | ICD-10-CM | POA: Diagnosis not present

## 2020-12-24 DIAGNOSIS — M797 Fibromyalgia: Secondary | ICD-10-CM

## 2020-12-24 DIAGNOSIS — M7062 Trochanteric bursitis, left hip: Secondary | ICD-10-CM

## 2020-12-24 DIAGNOSIS — M25561 Pain in right knee: Secondary | ICD-10-CM | POA: Diagnosis not present

## 2020-12-24 DIAGNOSIS — G8929 Other chronic pain: Secondary | ICD-10-CM

## 2020-12-24 DIAGNOSIS — Z5181 Encounter for therapeutic drug level monitoring: Secondary | ICD-10-CM

## 2020-12-24 DIAGNOSIS — M545 Low back pain, unspecified: Secondary | ICD-10-CM | POA: Diagnosis not present

## 2020-12-24 MED ORDER — HYDROCODONE-ACETAMINOPHEN 5-325 MG PO TABS: 1.0000 | ORAL_TABLET | Freq: Four times a day (QID) | ORAL | 0 refills | Status: DC | PRN

## 2020-12-24 MED ORDER — HYDROCODONE-ACETAMINOPHEN 5-325 MG PO TABS
1.0000 | ORAL_TABLET | Freq: Four times a day (QID) | ORAL | 0 refills | Status: DC | PRN
Start: 2020-12-24 — End: 2020-12-24

## 2020-12-24 NOTE — Progress Notes (Addendum)
Subjective:    Patient ID: Kara Bush, female    DOB: 1941-03-17, 80 y.o.   MRN: [id number]  HPI: Kara Bush is a 80 y.o. female whose appointment was changed to a Telephone visit, she arrived to office today and upon screening questions she was exposed to [place] via her Son-in law. Appointment changed to a telephone visit, [name] agrees with telephone visit and verbalizes understanding. She  States her pain is located in her mid- back mainly right side, lower back pain, bilateral hip pain and bilateral knee pain. She rates her pain 4. Her current exercise regime is walking and performing stretching exercises.  [name] Morphine equivalent is 22.56 MME.  Last UDS was Performed on 10/22/2020, it was consistent.     Pain Inventory Average Pain 5 Pain Right Now 4 My pain is constant and aching  In the last 24 hours, has pain interfered with the following? General activity 4 Relation with others 0 Enjoyment of life 2 What TIME of day is your pain at its worst? morning  Sleep (in general) Good  Pain is worse with: some activites Pain improves with: rest and medication Relief from Meds: 5  Family History  Problem Relation Age of Onset  . Heart disease Father   . Heart attack Father        @ 72  . Cancer Paternal Aunt        BREAST  . Heart failure Mother        at 36  . Healthy Sister   . Cancer Brother        pancreatic   . Heart failure Maternal Grandmother   . Heart attack Maternal Grandfather   . Heart attack Paternal Grandmother        at 49  . Healthy Daughter   . Healthy Daughter    Social History   Socioeconomic History  . Marital status: Divorced    Spouse name: Not on file  . Number of children: 2  . Years of education: college   . Highest education level: Not on file  Occupational History    Employer: RETIRED  Tobacco Use  . Smoking status: Former Smoker    Packs/day: 2.50    Years: 20.00    Pack years: 50.00    Types:  Cigarettes    Quit date: 06/29/1990    Years since quitting: 30.5  . Smokeless tobacco: Never Used  Vaping Use  . Vaping Use: Never used  Substance and Sexual Activity  . Alcohol use: No  . Drug use: No  . Sexual activity: Not on file  Other Topics Concern  . Not on file  Social History Narrative  . Not on file   Social Determinants of Health   Financial Resource Strain: Not on file  Food Insecurity: Not on file  Transportation Needs: Not on file  Physical Activity: Not on file  Stress: Not on file  Social Connections: Not on file   Past Surgical History:  Procedure Laterality Date  . [place]  . CATARACT EXTRACTION Left 09/2019  . CHOLECYSTECTOMY  1997  . CORONARY ANGIOPLASTY WITH STENT PLACEMENT  2006   to LAD, ATRETIC LIMA AT THAT TIME ALSO  . CORONARY ANGIOPLASTY WITH STENT PLACEMENT  2007   STENT TO PROX VG-DIAG, OTHER STENTS PATENT  . CORONARY ARTERY BYPASS GRAFT  1998   LIMA-LAD;VG-DIAG & RCA  . CORONARY STENT PLACEMENT  2005   TO LAD & LCX-STENTS,  .  HERNIA REPAIR    . TUBAL LIGATION     BTL   Past Surgical History:  Procedure Laterality Date  . [place]  . CATARACT EXTRACTION Left 09/2019  . CHOLECYSTECTOMY  1997  . CORONARY ANGIOPLASTY WITH STENT PLACEMENT  2006   to LAD, ATRETIC LIMA AT THAT TIME ALSO  . CORONARY ANGIOPLASTY WITH STENT PLACEMENT  2007   STENT TO PROX VG-DIAG, OTHER STENTS PATENT  . CORONARY ARTERY BYPASS GRAFT  1998   LIMA-LAD;VG-DIAG & RCA  . CORONARY STENT PLACEMENT  2005   TO LAD & LCX-STENTS,  . HERNIA REPAIR    . TUBAL LIGATION     BTL   Past Medical History:  Diagnosis Date  . Abdominal aortic aneurysm ([place])   . Anemia, improved 01/11/2013  . Basal cell carcinoma   . CAD (coronary artery disease)    hx CABG 1998, last cath 2007 with PCI and stenting to the proximal segment of the vein graft to the diagonal branch. DES Taxus was placed  . Cervicalgia   . Chest wall  pain   . Chronic fatigue fibromyalgia syndrome   . Chronic pain syndrome   . Coronary artery disease   . Depression   . Fibromyalgia   . Fibromyalgia   . History of nuclear stress test 08/07/2011   lexiscan; no evidence of ischemia or infarct; low risk   . Hyperlipidemia   . Hypertension   . Hypertension   . Obstructive sleep apnea   . OSA (obstructive sleep apnea)   . PAD (peripheral artery disease) ([place])   . Pes anserine bursitis   . Pulmonary embolism ([place]) 01/11/2013  . S/P resection of aortic aneurysm   . Trochanteric bursitis   . Ulcerative colitis ([place])    There were no vitals taken for this visit.  Opioid Risk Score:   Fall Risk Score:  `1  Depression screen PHQ 2/9  Depression screen [facility] 2/9 06/04/2020 02/02/2020 08/11/2019 04/13/2018 02/12/2018 12/11/2017 08/18/2017  Decreased Interest 0 0 0 0 0 0 0  Down, Depressed, Hopeless 0 0 0 0 0 0 0  PHQ - 2 Score 0 0 0 0 0 0 0  Tired, decreased energy - - - - - - -  Change in appetite - - - - - - -  Feeling bad or failure about yourself  - - - - - - -  Trouble concentrating - - - - - - -  Moving slowly or fidgety/restless - - - - - - -  Suicidal thoughts - - - - - - -   Review of Systems  Musculoskeletal: Positive for arthralgias, back pain and gait problem.  All other systems reviewed and are negative.      Objective:   Physical Exam Vitals and nursing note reviewed.  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 Fibromyalgia:ContinueHome Exercise Program.Continue to Monitor.12/24/2020 3. Bilateral intermittent hand numbness: Carpal tunnel syndrome versus C6 radiculopathy mild:No complaints today.Continue to Monitor.12/24/2020 4.Chronic Midline Low Back Pain/LBP: Refilled:Norco 5/325mg   #140pills--use one pill every 6 hours as neededfor pain. A second prescription wase-scribefor the followingmonth.12/24/2020. We will continue the opioid monitoring program, this consists of regular clinic visits, examinations, urine drug screen, pill counts as well as use of [place] Controlled  Substance Reporting system. A 12 month History has been reviewed on the [place] Controlled Substance Reporting Systemon05/23/2022..  5. Muscle Spasm: Continuecurrent medication regimen withRobaxin.12/24/2020 6.BilateralGreater Trochanteric Bursitis:Continue to Alternate with heat and ice therapy. Continue current medication regime.12/24/2020 7. Left Foot pain:No complaints today.Continue with HEP as Tolerated. Continue to Monitor.12/24/2020 8. RightLateral EpicondylitisPain:No complaints Today.Continue HEP as Tolerated. Continue to Monitor.12/24/2020 9. Right Shoulder Tendonitis:No complaints today. Continue Current Medication Regimen. Continue to Alternate Ice and Heat Therapy.12/24/2020 10. Bilateral Chronic Knee Pain: Continue HEP as Tolerated. Continue to Monitor  F/U in 2 months Established Patient Location of Patient: In her Home Location of Provider : In the Office Total Time Spent: 10 Minutes

## 2021-01-01 NOTE — Addendum Note (Signed)
Addended by: Bayard Hugger on: 01/01/2021 10:55 AM   Modules accepted: Level of Service

## 2021-01-16 DIAGNOSIS — F339 Major depressive disorder, recurrent, unspecified: Secondary | ICD-10-CM | POA: Diagnosis not present

## 2021-01-16 DIAGNOSIS — E7800 Pure hypercholesterolemia, unspecified: Secondary | ICD-10-CM | POA: Diagnosis not present

## 2021-01-16 DIAGNOSIS — G8929 Other chronic pain: Secondary | ICD-10-CM | POA: Diagnosis not present

## 2021-01-16 DIAGNOSIS — M1990 Unspecified osteoarthritis, unspecified site: Secondary | ICD-10-CM | POA: Diagnosis not present

## 2021-01-16 DIAGNOSIS — I4891 Unspecified atrial fibrillation: Secondary | ICD-10-CM | POA: Diagnosis not present

## 2021-01-16 DIAGNOSIS — I2510 Atherosclerotic heart disease of native coronary artery without angina pectoris: Secondary | ICD-10-CM | POA: Diagnosis not present

## 2021-01-16 DIAGNOSIS — I10 Essential (primary) hypertension: Secondary | ICD-10-CM | POA: Diagnosis not present

## 2021-01-26 ENCOUNTER — Other Ambulatory Visit: Payer: Self-pay | Admitting: Internal Medicine

## 2021-01-28 NOTE — Telephone Encounter (Signed)
61f, 83.9kg, scr 1.280 mg/ 11/21/2020, lovw/hilty 04/06/20

## 2021-02-04 DIAGNOSIS — E7800 Pure hypercholesterolemia, unspecified: Secondary | ICD-10-CM | POA: Diagnosis not present

## 2021-02-04 DIAGNOSIS — I10 Essential (primary) hypertension: Secondary | ICD-10-CM | POA: Diagnosis not present

## 2021-02-04 DIAGNOSIS — M1990 Unspecified osteoarthritis, unspecified site: Secondary | ICD-10-CM | POA: Diagnosis not present

## 2021-02-04 DIAGNOSIS — G8929 Other chronic pain: Secondary | ICD-10-CM | POA: Diagnosis not present

## 2021-02-04 DIAGNOSIS — I4891 Unspecified atrial fibrillation: Secondary | ICD-10-CM | POA: Diagnosis not present

## 2021-02-04 DIAGNOSIS — F339 Major depressive disorder, recurrent, unspecified: Secondary | ICD-10-CM | POA: Diagnosis not present

## 2021-02-04 DIAGNOSIS — I2510 Atherosclerotic heart disease of native coronary artery without angina pectoris: Secondary | ICD-10-CM | POA: Diagnosis not present

## 2021-02-18 ENCOUNTER — Encounter: Payer: PPO | Admitting: Registered Nurse

## 2021-02-25 ENCOUNTER — Encounter: Payer: PPO | Attending: Physical Medicine and Rehabilitation | Admitting: Registered Nurse

## 2021-02-25 ENCOUNTER — Other Ambulatory Visit: Payer: Self-pay

## 2021-02-25 VITALS — BP 157/70 | HR 69 | Temp 98.0°F | Ht 64.0 in | Wt 185.4 lb

## 2021-02-25 DIAGNOSIS — Z5181 Encounter for therapeutic drug level monitoring: Secondary | ICD-10-CM | POA: Insufficient documentation

## 2021-02-25 DIAGNOSIS — M545 Low back pain, unspecified: Secondary | ICD-10-CM | POA: Insufficient documentation

## 2021-02-25 DIAGNOSIS — M7062 Trochanteric bursitis, left hip: Secondary | ICD-10-CM | POA: Diagnosis not present

## 2021-02-25 DIAGNOSIS — M797 Fibromyalgia: Secondary | ICD-10-CM | POA: Insufficient documentation

## 2021-02-25 DIAGNOSIS — M25561 Pain in right knee: Secondary | ICD-10-CM | POA: Diagnosis not present

## 2021-02-25 DIAGNOSIS — M7061 Trochanteric bursitis, right hip: Secondary | ICD-10-CM | POA: Insufficient documentation

## 2021-02-25 DIAGNOSIS — G894 Chronic pain syndrome: Secondary | ICD-10-CM | POA: Diagnosis not present

## 2021-02-25 DIAGNOSIS — Z79891 Long term (current) use of opiate analgesic: Secondary | ICD-10-CM | POA: Insufficient documentation

## 2021-02-25 DIAGNOSIS — G8929 Other chronic pain: Secondary | ICD-10-CM | POA: Insufficient documentation

## 2021-02-25 DIAGNOSIS — M25562 Pain in left knee: Secondary | ICD-10-CM | POA: Insufficient documentation

## 2021-02-25 MED ORDER — HYDROCODONE-ACETAMINOPHEN 5-325 MG PO TABS: 1.0000 | ORAL_TABLET | Freq: Four times a day (QID) | ORAL | 0 refills | Status: DC | PRN

## 2021-02-25 NOTE — Progress Notes (Signed)
Subjective:    Patient ID: Kara Bush, female    DOB: [birth year], 81 y.o.   MRN: [id number]  HPI: Kara Bush is a 80 y.o. female who returns for follow up appointment for chronic pain and medication refill. She states her pain is located in her mid- back, bilateral hip pain and bilateral knee pain. She rates her pain 5. Her current exercise regime is walking and performing stretching exercises.  Kara Bush Morphine equivalent is 23.33 MME.   UDS ordered today.     Pain Inventory Average Pain 5 Pain Right Now 5 My pain is constant and aching  In the last 24 hours, has pain interfered with the following? General activity 2 Relation with others 0 Enjoyment of life 1 What TIME of day is your pain at its worst? morning  Sleep (in general) Good  Pain is worse with: bending and some activites Pain improves with: medication Relief from Meds: 5  Family History  Problem Relation Age of Onset   Heart disease Father    Heart attack Father        @ 68   Cancer Paternal Aunt        BREAST   Heart failure Mother        at 38   Healthy Sister    Cancer Brother        pancreatic    Heart failure Maternal Grandmother    Heart attack Maternal Grandfather    Heart attack Paternal Grandmother        at 11   Healthy Daughter    Healthy Daughter    Social History   Socioeconomic History   Marital status: Divorced    Spouse name: Not on file   Number of children: 2   Years of education: college    Highest education level: Not on file  Occupational History    Employer: RETIRED  Tobacco Use   Smoking status: Former    Packs/day: 2.50    Years: 20.00    Pack years: 50.00    Types: Cigarettes    Quit date: 06/29/1990    Years since quitting: 30.6   Smokeless tobacco: Never  Vaping Use   Vaping Use: Never used  Substance and Sexual Activity   Alcohol use: No   Drug use: No   Sexual activity: Not on file  Other Topics Concern   Not on file  Social History  Narrative   Not on file   Social Determinants of Health   Financial Resource Strain: Not on file  Food Insecurity: Not on file  Transportation Needs: Not on file  Physical Activity: Not on file  Stress: Not on file  Social Connections: Not on file   Past Surgical History:  Procedure Laterality Date   [place]   CATARACT EXTRACTION Left 09/2019   [place]  2006   to LAD, ATRETIC LIMA AT THAT TIME ALSO   CORONARY ANGIOPLASTY WITH STENT PLACEMENT  2007   STENT TO PROX VG-DIAG, OTHER STENTS PATENT   CORONARY ARTERY BYPASS GRAFT  1998   LIMA-LAD;VG-DIAG & RCA   CORONARY STENT PLACEMENT  2005   TO LAD & LCX-STENTS,   HERNIA REPAIR     TUBAL LIGATION     BTL   Past Surgical History:  Procedure Laterality Date   [place]   CATARACT EXTRACTION Left 09/2019   CHOLECYSTECTOMY  1997  CORONARY ANGIOPLASTY WITH STENT PLACEMENT  2006   to LAD, ATRETIC LIMA AT THAT TIME ALSO   CORONARY ANGIOPLASTY WITH STENT PLACEMENT  2007   STENT TO PROX VG-DIAG, OTHER STENTS PATENT   CORONARY ARTERY BYPASS GRAFT  1998   LIMA-LAD;VG-DIAG & RCA   CORONARY STENT PLACEMENT  2005   TO LAD & LCX-STENTS,   HERNIA REPAIR     TUBAL LIGATION     BTL   Past Medical History:  Diagnosis Date   Abdominal aortic aneurysm ([facility])    Anemia, improved 01/11/2013   Basal cell carcinoma    CAD (coronary artery disease)    hx CABG 1998, last cath 2007 with PCI and stenting to the proximal segment of the vein graft to the diagonal branch. DES Taxus was placed   Cervicalgia    Chest wall pain    Chronic fatigue fibromyalgia syndrome    Chronic pain syndrome    Coronary artery disease    Depression    Fibromyalgia    Fibromyalgia    History of nuclear stress test 08/07/2011   lexiscan; no evidence of ischemia or infarct; low risk    Hyperlipidemia    Hypertension    Hypertension    Obstructive sleep  apnea    OSA (obstructive sleep apnea)    PAD (peripheral artery disease) ([facility])    Pes anserine bursitis    Pulmonary embolism ([place]) 01/11/2013   S/P resection of aortic aneurysm    Trochanteric bursitis    Ulcerative colitis ([facility])    BP (!) 157/70 (BP Location: Right Arm, Patient Position: Sitting, Cuff Size: Large)   Pulse 69   Temp 98 F (36.7 C) (Oral)   Ht '5\' 4"'$  (1.626 m)   Wt 185 lb 6.4 oz (84.1 kg)   SpO2 94%   BMI 31.82 kg/m   Opioid Risk Score:   Fall Risk Score:  `1  Depression screen PHQ 2/9  Depression screen [facility] 2/9 06/04/2020 02/02/2020 08/11/2019 04/13/2018 02/12/2018 12/11/2017 08/18/2017  Decreased Interest 0 0 0 0 0 0 0  Down, Depressed, Hopeless 0 0 0 0 0 0 0  PHQ - 2 Score 0 0 0 0 0 0 0  Tired, decreased energy - - - - - - -  Change in appetite - - - - - - -  Feeling bad or failure about yourself  - - - - - - -  Trouble concentrating - - - - - - -  Moving slowly or fidgety/restless - - - - - - -  Suicidal thoughts - - - - - - -     Review of Systems  Musculoskeletal:  Positive for back pain.       Hip and knee pain  All other systems reviewed and are negative.     Objective:   Physical Exam Vitals and nursing note reviewed.  Constitutional:      Appearance: Normal appearance.  Cardiovascular:     Rate and Rhythm: Normal rate and regular rhythm.     Pulses: Normal pulses.     Heart sounds: Normal heart sounds.  Pulmonary:     Effort: Pulmonary effort is normal.     Breath sounds: Normal breath sounds.  Musculoskeletal:     Cervical back: Normal range of motion and neck supple.     Comments: Normal Muscle Bulk and Muscle Testing Reveals:  Upper Extremities: Full ROM and Muscle Strength 5/5  Thoracic Paraspinal Tenderness: T-7-T-9 Lumbar Paraspinal Tenderness: L-4-L-5 Bilateral Greater Trochanter  Tenderness Lower Extremities: Full ROM and Muscle Strength 5/5 Arises from Table with ease Narrow Based  Gait     Skin:    General: Skin is warm and  dry.  Neurological:     Mental Status: She is alert and oriented to person, place, and time.  Psychiatric:        Mood and Affect: Mood normal.        Behavior: Behavior normal.         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 02/25/2021 2. Fibromyalgia: Continue  Home Exercise Program. Continue to Monitor.02/25/2021 3. Bilateral intermittent hand numbness: Carpal tunnel syndrome versus C6 radiculopathy mild: No complaints today. Continue to Monitor. 02/25/2021 4. Chronic Midline Low Back Pain/ LBP: Refilled: Norco 5/'325mg'$  # 140 pills--use one pill every 6 hours as needed for pain. A second prescription was e-scribe for the following month. 02/25/2021. We will continue the opioid monitoring program, this consists of regular clinic visits, examinations, urine drug screen, pill counts as well as use of [place] Controlled Substance Reporting system. A 12 month History has been reviewed on the [place] Controlled Substance Reporting System on 02/25/2021.. 5. Muscle Spasm: Continue current medication regimen with  Robaxin. 02/25/2021 6.Bilateral  Greater Trochanteric Bursitis:Continue to Alternate with heat and ice therapy. Continue current medication regime. 02/25/2021  7. Left Foot pain: No complaints today.Continue with HEP as Tolerated. Continue to Monitor. 02/25/2021 8. Right Lateral Epicondylitis Pain: No complaints Today. Continue HEP as Tolerated. Continue to Monitor. 02/25/2021 9. Right Shoulder Tendonitis:  No complaints today. Continue Current Medication Regimen. Continue to Alternate Ice and Heat Therapy. 02/25/2021  10. Bilateral Chronic Knee Pain: Continue HEP as Tolerated. Continue to Monitor. 02/25/2021   F/U in 2 months

## 2021-02-26 ENCOUNTER — Ambulatory Visit: Payer: Self-pay | Admitting: *Deleted

## 2021-02-26 ENCOUNTER — Emergency Department (HOSPITAL_COMMUNITY): Payer: PPO

## 2021-02-26 ENCOUNTER — Other Ambulatory Visit: Payer: Self-pay

## 2021-02-26 ENCOUNTER — Telehealth: Payer: Self-pay | Admitting: General Practice

## 2021-02-26 ENCOUNTER — Observation Stay (HOSPITAL_COMMUNITY)
Admission: EM | Admit: 2021-02-26 | Discharge: 2021-02-27 | Disposition: A | Discharge status: Home / Self Care | Payer: PPO | Attending: Internal Medicine | Admitting: Internal Medicine

## 2021-02-26 ENCOUNTER — Encounter (HOSPITAL_COMMUNITY): Payer: Self-pay

## 2021-02-26 DIAGNOSIS — I10 Essential (primary) hypertension: Secondary | ICD-10-CM | POA: Insufficient documentation

## 2021-02-26 DIAGNOSIS — Z85828 Personal history of other malignant neoplasm of skin: Secondary | ICD-10-CM | POA: Diagnosis not present

## 2021-02-26 DIAGNOSIS — Z79899 Other long term (current) drug therapy: Secondary | ICD-10-CM | POA: Diagnosis not present

## 2021-02-26 DIAGNOSIS — Z951 Presence of aortocoronary bypass graft: Secondary | ICD-10-CM | POA: Insufficient documentation

## 2021-02-26 DIAGNOSIS — R002 Palpitations: Secondary | ICD-10-CM | POA: Diagnosis present

## 2021-02-26 DIAGNOSIS — I4891 Unspecified atrial fibrillation: Principal | ICD-10-CM

## 2021-02-26 DIAGNOSIS — Z87891 Personal history of nicotine dependence: Secondary | ICD-10-CM | POA: Insufficient documentation

## 2021-02-26 DIAGNOSIS — R0602 Shortness of breath: Secondary | ICD-10-CM | POA: Diagnosis not present

## 2021-02-26 DIAGNOSIS — Z7901 Long term (current) use of anticoagulants: Secondary | ICD-10-CM | POA: Diagnosis not present

## 2021-02-26 DIAGNOSIS — I2510 Atherosclerotic heart disease of native coronary artery without angina pectoris: Secondary | ICD-10-CM | POA: Diagnosis not present

## 2021-02-26 DIAGNOSIS — Z20822 Contact with and (suspected) exposure to covid-19: Secondary | ICD-10-CM | POA: Diagnosis not present

## 2021-02-26 DIAGNOSIS — I480 Paroxysmal atrial fibrillation: Secondary | ICD-10-CM | POA: Diagnosis present

## 2021-02-26 LAB — COMPREHENSIVE METABOLIC PANEL
ALT: 18 U/L (ref 0–44)
AST: 22 U/L (ref 15–41)
Albumin: 3.9 g/dL (ref 3.5–5.0)
Alkaline Phosphatase: 43 U/L (ref 38–126)
Anion gap: 8 (ref 5–15)
BUN: 19 mg/dL (ref 8–23)
CO2: 27 mmol/L (ref 22–32)
Calcium: 9.2 mg/dL (ref 8.9–10.3)
Chloride: 105 mmol/L (ref 98–111)
Creatinine, Ser: 1.24 mg/dL — ABNORMAL HIGH (ref 0.44–1.00)
GFR, Estimated: 44 mL/min — ABNORMAL LOW (ref >60)
Glucose, Bld: 134 mg/dL — ABNORMAL HIGH (ref 70–99)
Potassium: 4 mmol/L (ref 3.5–5.1)
Sodium: 140 mmol/L (ref 135–145)
Total Bilirubin: 0.6 mg/dL (ref 0.3–1.2)
Total Protein: 6.6 g/dL (ref 6.5–8.1)

## 2021-02-26 LAB — CBC WITH DIFFERENTIAL/PLATELET
Abs Immature Granulocytes: 0.03 10*3/uL (ref 0.00–0.07)
Basophils Absolute: 0.1 10*3/uL (ref 0.0–0.1)
Basophils Relative: 1 %
Eosinophils Absolute: 0.1 10*3/uL (ref 0.0–0.5)
Eosinophils Relative: 2 %
HCT: 45.4 % (ref 36.0–46.0)
Hemoglobin: 12.9 g/dL (ref 12.0–15.0)
Immature Granulocytes: 0 %
Lymphocytes Relative: 28 %
Lymphs Abs: 2.7 10*3/uL (ref 0.7–4.0)
MCH: 25.9 pg — ABNORMAL LOW (ref 26.0–34.0)
MCHC: 28.4 g/dL — ABNORMAL LOW (ref 30.0–36.0)
MCV: 91.2 fL (ref 80.0–100.0)
Monocytes Absolute: 1 10*3/uL (ref 0.1–1.0)
Monocytes Relative: 11 %
Neutro Abs: 5.7 10*3/uL (ref 1.7–7.7)
Neutrophils Relative %: 58 %
Platelets: 192 10*3/uL (ref 150–400)
RBC: 4.98 MIL/uL (ref 3.87–5.11)
RDW: 14.3 % (ref 11.5–15.5)
WBC: 9.6 10*3/uL (ref 4.0–10.5)
nRBC: 0 % (ref 0.0–0.2)

## 2021-02-26 LAB — RESP PANEL BY RT-PCR (FLU A&B, COVID) ARPGX2
Influenza A by PCR: NEGATIVE
Influenza B by PCR: NEGATIVE
SARS Coronavirus 2 by RT PCR: NEGATIVE

## 2021-02-26 LAB — TROPONIN I (HIGH SENSITIVITY)
Troponin I (High Sensitivity): 7 ng/L (ref <18)
Troponin I (High Sensitivity): 9 ng/L (ref <18)

## 2021-02-26 LAB — BRAIN NATRIURETIC PEPTIDE: B Natriuretic Peptide: 263.1 pg/mL — ABNORMAL HIGH (ref 0.0–100.0)

## 2021-02-26 MED ORDER — METHOCARBAMOL 500 MG PO TABS: 500.0000 mg | ORAL_TABLET | Freq: Three times a day (TID) | ORAL | Status: DC | PRN

## 2021-02-26 MED ORDER — B COMPLEX VITAMINS PO CAPS: 1.0000 | ORAL_CAPSULE | Freq: Every day | ORAL | Status: DC

## 2021-02-26 MED ORDER — CITALOPRAM HYDROBROMIDE 20 MG PO TABS
20.0000 mg | ORAL_TABLET | Freq: Every day | ORAL | Status: DC
  Administered 2021-02-26 – 2021-02-27 (×2): 20 mg via ORAL
  Filled 2021-02-26 (×2): qty 1

## 2021-02-26 MED ORDER — ADULT MULTIVITAMIN W/MINERALS CH
1.0000 | ORAL_TABLET | Freq: Every day | ORAL | Status: DC
  Administered 2021-02-26 – 2021-02-27 (×2): 1 via ORAL
  Filled 2021-02-26 (×2): qty 1

## 2021-02-26 MED ORDER — ZOLPIDEM TARTRATE 5 MG PO TABS
5.0000 mg | ORAL_TABLET | Freq: Every day | ORAL | Status: DC
  Administered 2021-02-26: 5 mg via ORAL
  Filled 2021-02-26 (×2): qty 1

## 2021-02-26 MED ORDER — EZETIMIBE 10 MG PO TABS
10.0000 mg | ORAL_TABLET | Freq: Every day | ORAL | Status: DC
  Administered 2021-02-27: 10 mg via ORAL
  Filled 2021-02-26: qty 1

## 2021-02-26 MED ORDER — SIMVASTATIN 40 MG PO TABS
40.0000 mg | ORAL_TABLET | Freq: Every day | ORAL | Status: DC
  Administered 2021-02-26 – 2021-02-27 (×2): 40 mg via ORAL
  Filled 2021-02-26 (×2): qty 1

## 2021-02-26 MED ORDER — IRBESARTAN 75 MG PO TABS
75.0000 mg | ORAL_TABLET | Freq: Every day | ORAL | Status: DC
  Administered 2021-02-27: 75 mg via ORAL
  Filled 2021-02-26: qty 1

## 2021-02-26 MED ORDER — ONDANSETRON HCL 4 MG/2ML IJ SOLN: 4.0000 mg | Freq: Four times a day (QID) | INTRAMUSCULAR | Status: DC | PRN

## 2021-02-26 MED ORDER — METOPROLOL TARTRATE 25 MG PO TABS
25.0000 mg | ORAL_TABLET | Freq: Once | ORAL | Status: AC
  Administered 2021-02-26: 25 mg via ORAL
  Filled 2021-02-26: qty 1

## 2021-02-26 MED ORDER — HYDROCODONE-ACETAMINOPHEN 5-325 MG PO TABS
0.5000 | ORAL_TABLET | Freq: Every day | ORAL | Status: DC | PRN
  Administered 2021-02-26 – 2021-02-27 (×3): 1 via ORAL
  Filled 2021-02-26 (×3): qty 1

## 2021-02-26 MED ORDER — APIXABAN 5 MG PO TABS
5.0000 mg | ORAL_TABLET | Freq: Two times a day (BID) | ORAL | Status: DC
  Administered 2021-02-26 – 2021-02-27 (×2): 5 mg via ORAL
  Filled 2021-02-26 (×2): qty 1

## 2021-02-26 MED ORDER — ONDANSETRON HCL 4 MG PO TABS: 4.0000 mg | ORAL_TABLET | Freq: Four times a day (QID) | ORAL | Status: DC | PRN

## 2021-02-26 MED ORDER — B COMPLEX-C PO TABS
1.0000 | ORAL_TABLET | Freq: Every day | ORAL | Status: DC
  Administered 2021-02-27: 1 via ORAL
  Filled 2021-02-26 (×2): qty 1

## 2021-02-26 NOTE — H&P (Signed)
History and Physical    [name] [id number] DOB: 1940/08/16 DOA: 02/26/2021  PCP: Aretta Nip, MD  Patient coming from: Home  Chief Complaint: palpitations  HPI: Kara Bush is a 80 y.o. female with medical history significant of a fib, HTN, HLD. Presenting with a fib w/ RVR. She reports that he Apple watch has been alerting her to her a fib status quiet often over the last week. She has felt some weakness and lightheadedness during this time. She has had no chest pain. She does have dyspnea w/ activity. She reports compliance with her normal regimen. She became came concerned today that she is going into some many runs of a fib. So she decided to come to the ED for eval. She denies any other aggravating or alleviating factors.    ED Course: She had several runs of a fib RVR. She was resumed on her home regimen, but continued to have a fib runs. She was given an extra dose of metoprolol. Cardiology was consulted. TRH was called for admission.   Review of Systems:  Review of systems is otherwise negative for all not mentioned in HPI.   PMHx Past Medical History:  Diagnosis Date   Abdominal aortic aneurysm ([place])    Anemia, improved 01/11/2013   Basal cell carcinoma    CAD (coronary artery disease)    hx CABG 1998, last cath 2007 with PCI and stenting to the proximal segment of the vein graft to the diagonal branch. DES Taxus was placed   Cervicalgia    Chest wall pain    Chronic fatigue fibromyalgia syndrome    Chronic pain syndrome    Coronary artery disease    Depression    Fibromyalgia    Fibromyalgia    History of nuclear stress test 08/07/2011   lexiscan; no evidence of ischemia or infarct; low risk    Hyperlipidemia    Hypertension    Hypertension    Obstructive sleep apnea    OSA (obstructive sleep apnea)    PAD (peripheral artery disease) (HCC)    Pes anserine bursitis    Pulmonary embolism ([place]) 01/11/2013   S/P resection of aortic aneurysm     Trochanteric bursitis    Ulcerative colitis ([place])     PSHx Past Surgical History:  Procedure Laterality Date   AORTOBIEXTERNAL ILIAC BYPASS  1991   CATARACT EXTRACTION Left 09/2019   CHOLECYSTECTOMY  1997   CORONARY ANGIOPLASTY WITH STENT PLACEMENT  2006   to LAD, ATRETIC LIMA AT THAT TIME ALSO   CORONARY ANGIOPLASTY WITH STENT PLACEMENT  2007   STENT TO PROX VG-DIAG, OTHER STENTS PATENT   CORONARY ARTERY BYPASS GRAFT  1998   LIMA-LAD;VG-DIAG & RCA   CORONARY STENT PLACEMENT  2005   TO LAD & LCX-STENTS,   HERNIA REPAIR     TUBAL LIGATION     BTL    SocHx  reports that she quit smoking about 30 years ago. Her smoking use included cigarettes. She has a 50.00 pack-year smoking history. She has never used smokeless tobacco. She reports that she does not drink alcohol and does not use drugs.  Allergies  Allergen Reactions   Biotin Hives   Lisinopril Cough   Atenolol Other (See Comments)    colitis   Bactrim [Sulfamethoxazole-Trimethoprim] Nausea And Vomiting   Cymbalta [Duloxetine Hcl] Other (See Comments)    Mad pt feel spacey   Lyrica [Pregabalin] Other (See Comments)    Made pt feel spacey  Penicillins Rash    FamHx Family History  Problem Relation Age of Onset   Heart disease Father    Heart attack Father        @ 6   Cancer Paternal Aunt        BREAST   Heart failure Mother        at 38   Healthy Sister    Cancer Brother        pancreatic    Heart failure Maternal Grandmother    Heart attack Maternal Grandfather    Heart attack Paternal Grandmother        at 74   Healthy Daughter    Healthy Daughter     Prior to Admission medications   Medication Sig Start Date End Date Taking? Authorizing Provider  b complex vitamins capsule Take 1 capsule by mouth daily.   Yes [provider]  citalopram (CELEXA) 20 MG tablet Take 20 mg by mouth daily.  11/03/16  Yes [provider]  diclofenac sodium (VOLTAREN) 1 % GEL Apply 1 application  topically daily as needed (joint pain).   Yes [provider]  ELIQUIS 5 MG TABS tablet TAKE ONE TABLET BY MOUTH TWICE DAILY Patient taking differently: Take 5 mg by mouth 2 (two) times daily. 01/28/21  Yes Hilty, Nadean Corwin, MD  ezetimibe (ZETIA) 10 MG tablet TAKE ONE TABLET BY MOUTH ONCE DAILY Patient taking differently: Take 10 mg by mouth daily. 01/28/21  Yes Hilty, Nadean Corwin, MD  HYDROcodone-acetaminophen (NORCO/VICODIN) 5-325 MG tablet Take 1 tablet by mouth 4 (four) times daily as needed for moderate pain. May Take an extra 0.5 to one tablet when pain is severe. Patient taking differently: Take 0.5-1 tablets by mouth 5 (five) times daily as needed for moderate pain. 02/25/21  Yes Bayard Hugger, NP  methocarbamol (ROBAXIN) 500 MG tablet Take 1 tablet (500 mg total) by mouth every 8 (eight) hours as needed for muscle spasms. 04/04/20  Yes Bayard Hugger, NP  metoprolol succinate (TOPROL-XL) 25 MG 24 hr tablet TAKE ONE TABLET BY MOUTH ONCE DAILY Patient taking differently: Take 25 mg by mouth daily. 01/28/21  Yes Hilty, Nadean Corwin, MD  Multiple Vitamin (MULITIVITAMIN WITH MINERALS) TABS Take 1 tablet by mouth daily.   Yes [provider]  simvastatin (ZOCOR) 40 MG tablet TAKE ONE TABLET BY MOUTH ONCE DAILY Patient taking differently: Take 40 mg by mouth daily. 01/28/21  Yes Hilty, Nadean Corwin, MD  valsartan (DIOVAN) 80 MG tablet TAKE ONE TABLET BY MOUTH ONCE DAILY Patient taking differently: Take 80 mg by mouth daily. 01/28/21  Yes Hilty, Nadean Corwin, MD  zolpidem (AMBIEN) 5 MG tablet Take 5 mg by mouth at bedtime. 05/08/18  Yes [provider]    Physical Exam: Vitals:   02/26/21 1350 02/26/21 1400 02/26/21 1415 02/26/21 1430  BP: (!) 169/98 (!) 187/168 (!) 159/63   Pulse: (!) 105 (!) 54 84 78  Resp:  '14 19 15  '$ Temp:      TempSrc:      SpO2:  97% 95% 98%  Weight:      Height:        General: 80 y.o. female resting in bed in NAD Eyes: PERRL, normal sclera ENMT:  Nares patent w/o discharge, orophaynx clear, dentition normal, ears w/o discharge/lesions/ulcers Neck: Supple, trachea midline Cardiovascular: irregular, +S1, S2, no m/g/r, equal pulses throughout Respiratory: CTABL, no w/r/r, normal WOB GI: BS+, NDNT, no masses noted, no organomegaly noted MSK: No e/c/c Skin:  No rashes, bruises, ulcerations noted Neuro: A&O x 3, no focal deficits Psyc: Appropriate interaction and affect, calm/cooperative  Labs on Admission: I have personally reviewed following labs and imaging studies  CBC: Recent Labs  Lab 02/26/21 1135  WBC 9.6  NEUTROABS 5.7  HGB 12.9  HCT 45.4  MCV 91.2  PLT [id number]   Basic Metabolic Panel: Recent Labs  Lab 02/26/21 1135  NA 140  K 4.0  CL 105  CO2 27  GLUCOSE 134*  BUN 19  CREATININE 1.24*  CALCIUM 9.2   GFR: Estimated Creatinine Clearance: 37.2 mL/min (A) (by C-G formula based on SCr of 1.24 mg/dL (H)). Liver Function Tests: Recent Labs  Lab 02/26/21 1135  AST 22  ALT 18  ALKPHOS 43  BILITOT 0.6  PROT 6.6  ALBUMIN 3.9   No results for input(s): LIPASE, AMYLASE in the last 168 hours. No results for input(s): AMMONIA in the last 168 hours. Coagulation Profile: No results for input(s): INR, PROTIME in the last 168 hours. Cardiac Enzymes: No results for input(s): CKTOTAL, CKMB, CKMBINDEX, TROPONINI in the last 168 hours. BNP (last 3 results) No results for input(s): PROBNP in the last 8760 hours. HbA1C: No results for input(s): HGBA1C in the last 72 hours. CBG: No results for input(s): GLUCAP in the last 168 hours. Lipid Profile: No results for input(s): CHOL, HDL, LDLCALC, TRIG, CHOLHDL, LDLDIRECT in the last 72 hours. Thyroid Function Tests: No results for input(s): TSH, T4TOTAL, FREET4, T3FREE, THYROIDAB in the last 72 hours. Anemia Panel: No results for input(s): VITAMINB12, FOLATE, FERRITIN, TIBC, IRON, RETICCTPCT in the last 72 hours. Urine analysis:    Component Value Date/Time   COLORURINE  YELLOW 11/20/2015 1757   APPEARANCEUR CLEAR 11/20/2015 1757   LABSPEC >1.046 (H) 11/20/2015 1757   PHURINE 6.0 11/20/2015 1757   GLUCOSEU NEGATIVE 11/20/2015 1757   HGBUR NEGATIVE 11/20/2015 1757   BILIRUBINUR NEGATIVE 11/20/2015 1757   KETONESUR NEGATIVE 11/20/2015 1757   PROTEINUR NEGATIVE 11/20/2015 1757   NITRITE NEGATIVE 11/20/2015 1757   LEUKOCYTESUR NEGATIVE 11/20/2015 1757    Radiological Exams on Admission: DG Chest 2 View  Result Date: 02/26/2021 CLINICAL DATA:  Shortness of breath EXAM: CHEST - 2 VIEW COMPARISON:  07/31/2011, 09/10/2020 FINDINGS: Post CABG changes. Heart size is upper limits of normal. Atherosclerotic calcification of the aortic knob. Trace amount of fluid within the minor fissure, similar to prior CT. No focal airspace consolidation. No left-sided pleural effusion. No pneumothorax. IMPRESSION: Trace amount of fluid within the minor fissure, similar to prior. No focal airspace consolidation. Electronically Signed   By: Davina Poke D.O.   On: 02/26/2021 13:05    EKG: Independently reviewed. A flutter  Assessment/Plan A fib RVR/A flutter     - places in obs tele     - home regimen resumed; she received and additional dose of metoprolol     - EDP spoke with cardiology; will await recs     - continue eliquis     - BP is stable, she denies CP  HTN     - resume home diovan, metoprolol  HLD      - resume home statin  DVT prophylaxis: eliquis  Code Status: DNR, confirmed with patient  Family Communication: w/ dtr at bedside  Consults called: EDP spoke cardiology   Status is: Observation  The patient remains OBS appropriate and will d/c before 2 midnights.  Dispo: The patient is from: Home  Anticipated d/c is to: Home              Patient currently is not medically stable to d/c.   Difficult to place patient No  Time spent coordinating admission: 45 minutes  [facility]  If 7PM-7AM, please contact  night-coverage [email or web address]  02/26/2021, 2:45 PM

## 2021-02-26 NOTE — Telephone Encounter (Signed)
Patient c/o Palpitations:  High priority if patient c/o lightheadedness, shortness of breath, or chest pain  How long have you had palpitations/irregular HR/ Afib? Are you having the symptoms now? A day or two; yes   Are you currently experiencing lightheadedness, SOB or CP? No   Do you have a history of afib (atrial fibrillation) or irregular heart rhythm? Yes   Have you checked your BP or HR? (document readings if available): HR in 130's and 140's  Are you experiencing any other symptoms? Stay fatigued

## 2021-02-26 NOTE — ED Provider Notes (Signed)
[facility] DEPT Provider Note   CSN: [id number] Arrival date & time: 02/26/21  1101     History Chief Complaint  Patient presents with   Tachycardia   Shortness of Breath    Kara Bush is a 80 y.o. female.   Shortness of Breath Associated symptoms: no chest pain and no fever    Patient presents with palpitations and shortness of breath x2 weeks.  She has a history of A. fib for the last 3 years, she takes Eliquis for anticoagulation and metoprolol for rate control.  She has had no recent changes in medication or missed doses. states that weeks ago she noticed on her apple watch her heart rates been higher than normal.  No recent illnesses.    She feels short of breath when she is walking up and down stairs, as well as during the episodes of elevated heart rate. Resting makes it better, no associated chest pressure.    Past Medical History:  Diagnosis Date   Abdominal aortic aneurysm ([place])    Anemia, improved 01/11/2013   Basal cell carcinoma    CAD (coronary artery disease)    hx CABG 1998, last cath 2007 with PCI and stenting to the proximal segment of the vein graft to the diagonal branch. DES Taxus was placed   Cervicalgia    Chest wall pain    Chronic fatigue fibromyalgia syndrome    Chronic pain syndrome    Coronary artery disease    Depression    Fibromyalgia    Fibromyalgia    History of nuclear stress test 08/07/2011   lexiscan; no evidence of ischemia or infarct; low risk    Hyperlipidemia    Hypertension    Hypertension    Obstructive sleep apnea    OSA (obstructive sleep apnea)    PAD (peripheral artery disease) (HCC)    Pes anserine bursitis    Pulmonary embolism ([place]) 01/11/2013   S/P resection of aortic aneurysm    Trochanteric bursitis    Ulcerative colitis [facility])     Patient Active Problem List   Diagnosis Date Noted   Atrial fibrillation ([place]) 09/09/2018   DOE (dyspnea on exertion) 08/10/2017   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   Greater trochanteric bursitis of right hip 11/07/2014   Sacro-iliac pain 11/07/2014   Chronic midline low back pain without sciatica 11/07/2013   Hepatitis 04/20/2013   Screening for STD (sexually transmitted disease) 04/20/2013   Recurrent UTI 04/20/2013   Abdominal aortic aneurysm (HCC)    Ulcerative colitis ([place])    Obstructive sleep apnea    Fibromyalgia    Coronary artery disease    Basal cell carcinoma    Anemia, improved 01/11/2013   Ascending aortic aneurysm, 4.1 cm 01/11/2013   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     Hypertension     Past Surgical History:  Procedure Laterality Date   AORTOBIEXTERNAL ILIAC BYPASS  1991   CATARACT EXTRACTION Left 09/2019   CHOLECYSTECTOMY  1997   CORONARY ANGIOPLASTY WITH STENT PLACEMENT  2006  to LAD, ATRETIC LIMA AT THAT TIME ALSO   CORONARY ANGIOPLASTY WITH STENT PLACEMENT  2007   STENT TO PROX VG-DIAG, OTHER STENTS PATENT   CORONARY ARTERY BYPASS GRAFT  1998   LIMA-LAD;VG-DIAG & RCA   CORONARY STENT PLACEMENT  2005   TO LAD & LCX-STENTS,   HERNIA REPAIR     TUBAL LIGATION     BTL     OB History   No obstetric history on file.     Family History  Problem Relation Age of Onset   Heart disease Father    Heart attack Father        @ 16   Cancer Paternal Aunt        BREAST   Heart failure Mother        at 56   Healthy Sister    Cancer Brother        pancreatic    Heart failure Maternal Grandmother    Heart attack Maternal Grandfather    Heart attack Paternal Grandmother         at 40   Healthy Daughter    Healthy Daughter     Social History   Tobacco Use   Smoking status: Former    Packs/day: 2.50    Years: 20.00    Pack years: 50.00    Types: Cigarettes    Quit date: 06/29/1990    Years since quitting: 30.6   Smokeless tobacco: Never  Vaping Use   Vaping Use: Never used  Substance Use Topics   Alcohol use: No   Drug use: No    Home Medications Prior to Admission medications   Medication Sig Start Date End Date Taking? Authorizing Provider  b complex vitamins capsule Take 1 capsule by mouth daily.    [provider]  citalopram (CELEXA) 20 MG tablet Take 20 mg by mouth daily.  11/03/16   [provider]  diclofenac sodium (VOLTAREN) 1 % GEL Apply topically as needed.     [provider]  ELIQUIS 5 MG TABS tablet TAKE ONE TABLET BY MOUTH TWICE DAILY 01/28/21   Hilty, Nadean Corwin, MD  ezetimibe (ZETIA) 10 MG tablet TAKE ONE TABLET BY MOUTH ONCE DAILY 01/28/21   Hilty, Nadean Corwin, MD  HYDROcodone-acetaminophen (NORCO/VICODIN) 5-325 MG tablet Take 1 tablet by mouth 4 (four) times daily as needed for moderate pain. May Take an extra 0.5 to one tablet when pain is severe. 02/25/21   Bayard Hugger, NP  methocarbamol (ROBAXIN) 500 MG tablet Take 1 tablet (500 mg total) by mouth every 8 (eight) hours as needed for muscle spasms. 04/04/20   Bayard Hugger, NP  metoprolol succinate (TOPROL-XL) 25 MG 24 hr tablet TAKE ONE TABLET BY MOUTH ONCE DAILY 01/28/21   Hilty, Nadean Corwin, MD  Multiple Vitamin (MULITIVITAMIN WITH MINERALS) TABS Take 1 tablet by mouth daily.    [provider]  simvastatin (ZOCOR) 40 MG tablet TAKE ONE TABLET BY MOUTH ONCE DAILY 01/28/21   Hilty, Nadean Corwin, MD  tobramycin (TOBREX) 0.3 % ophthalmic solution as needed.     [provider]  valsartan (DIOVAN) 80 MG tablet TAKE ONE TABLET BY MOUTH ONCE DAILY 01/28/21   Hilty, Nadean Corwin, MD  zolpidem (AMBIEN) 5 MG tablet TAKE 1 TABLET BY MOUTH 1-2 AT BEDTIME AS  NEEDED **MUST LAST 30 DAYS** 05/08/18   [provider]    Allergies    Biotin, Lisinopril, Atenolol, Bactrim [sulfamethoxazole-trimethoprim], Cymbalta [duloxetine hcl], Lyrica [pregabalin], and Penicillins  Review of Systems   Review of Systems  Constitutional:  Negative for fever.  Respiratory:  Positive for shortness of breath.   Cardiovascular:  Positive for palpitations. Negative for chest pain and leg swelling.   Physical Exam Updated Vital Signs BP (!) 157/105 (BP Location: Left Arm)   Pulse 71   Temp 98 F (36.7 C) (Oral)   Resp (!) 119   Ht '5\' 4"'$  (1.626 m)   Wt 83.5 kg   SpO2 99%   BMI 31.58 kg/m   Physical Exam Vitals and nursing note reviewed. Exam conducted with a chaperone present.  Constitutional:      General: She is not in acute distress.    Appearance: Normal appearance. She is well-developed.  HENT:     Head: Normocephalic and atraumatic.  Eyes:     General: No scleral icterus.       Right eye: No discharge.        Left eye: No discharge.     Extraocular Movements: Extraocular movements intact.     Conjunctiva/sclera: Conjunctivae normal.     Pupils: Pupils are equal, round, and reactive to light.  Cardiovascular:     Rate and Rhythm: Tachycardia present. Rhythm irregular.     Pulses: Normal pulses.     Heart sounds: Normal heart sounds. No murmur heard.   No friction rub. No gallop.  Pulmonary:     Effort: Pulmonary effort is normal. No tachypnea, accessory muscle usage or respiratory distress.     Breath sounds: Normal breath sounds.     Comments: Speaking complete sentences, no accessory muscle use.  Lungs are clear to auscultation bilaterally Abdominal:     General: Abdomen is flat. Bowel sounds are normal. There is no distension.     Palpations: Abdomen is soft.     Tenderness: There is no abdominal tenderness.  Musculoskeletal:     Cervical back: Neck supple.  Skin:    General: Skin is warm and dry.     Coloration: Skin is not  jaundiced.  Neurological:     Mental Status: She is alert. Mental status is at baseline.     Coordination: Coordination normal.    ED Results / Procedures / Treatments   Labs (all labs ordered are listed, but only abnormal results are displayed) Labs Reviewed  CBC WITH DIFFERENTIAL/PLATELET - Abnormal; Notable for the following components:      Result Value   MCH 25.9 (*)    MCHC 28.4 (*)    All other components within normal limits  COMPREHENSIVE METABOLIC PANEL  BRAIN NATRIURETIC PEPTIDE  TROPONIN I (HIGH SENSITIVITY)    EKG EKG Interpretation  Date/Time:  Tuesday February 26 2021 11:07:54 EDT Ventricular Rate:  123 PR Interval:    QRS Duration: 90 QT Interval:  346 QTC Calculation: 495 R Axis:   86 Text Interpretation: Atrial flutter Borderline right axis deviation Anteroseptal infarct, old Nonspecific T abnormalities, lateral leads 12 Lead; [place] Confirmed by Davonna Belling [phone]) on 02/26/2021 11:21:43 AM  Radiology No results found.  Procedures Procedures   Medications Ordered in ED Medications - No data to display  ED Course  I have reviewed the triage vital signs and the nursing notes.  Pertinent labs & imaging results that were available during my care of the patient were reviewed by me and considered in my medical decision making (see chart for details).  Clinical Course as of 02/26/21 1442  Tue Feb 26, 2021  1321 Troponin I (High Sensitivity) Doubt ACS [HS]  1322 Comprehensive metabolic panel(!) No electrolyte derrangement, no AKI, no elevated LFTs concerning for hepatitis or biliary involvement [HS]  1324 CBC with Differential(!) No anemia, no leukocytosis [HS]  1324 DG Chest 2 View No significant changes from previous radiographs.  No signs of pneumonia, pneumothorax, pleural effusion [HS]    Clinical Course User Index [HS] Sherrill Raring, PA-C   MDM Rules/Calculators/A&P                           Patient is in atrial fibrillation,  currently not in aVR.  She was in the 140s during triage, but now her heart rate has diminished back down to 103.  She is resting comfortably, not struggling to breathe.  We will check labs to see if there is a cause for her tachycardia.  We will abstain from giving metoprolol given her heart rate appears controlled at this time.  Please see ED course for interpretation of labs and imaging.  Patient is still tachycardic, will give 25 of Lopressor.  Patient actually became mildly bradycardic for a second but now her pulse rate is controlled in the 70s and 80s.  I spoke with Dr. Marylyn Ishihara with the hospitalist who is happy to admit the patient for observation.  Spoke with Wannetta Sender with cardiology who will have cardiology team round on her in the morning.  Final Clinical Impression(s) / ED Diagnoses Final diagnoses:  None    Rx / DC Orders ED Discharge Orders     None        Sherrill Raring, Hershal Coria 02/26/21 1442    Davonna Belling, MD 02/26/21 1600

## 2021-02-26 NOTE — ED Triage Notes (Signed)
Pt to er, pt states that she has had a fib for the past three years, states that for the past couple of weeks she has had a rapid heart rate, states that she has been noticing it on her watch, states that it has been up to 150.  States that sometimes she has some pressure in her chest.  States that she is normally out of a fib, but over the past few days she has been in constantly.  Pt talking in full sentences, denies dizziness

## 2021-02-26 NOTE — Telephone Encounter (Signed)
Reason for Disposition  Dizziness, lightheadedness, or weakness  [1] Heart beating very rapidly (e.g., > 140 / minute) AND [2] present now  (Exception: during exercise)  Answer Assessment - Initial Assessment Questions 1. DESCRIPTION: "Please describe your heart rate or heartbeat that you are having" (e.g., fast/slow, regular/irregular, skipped or extra beats, "palpitations")     Patient is Afib patient- she has been doing well- but 1 week ago- she has been have rapid heart rate-150-160 sitting. Constant rapid heart rate for 24 hours 2. ONSET: "When did it start?" (Minutes, hours or days)      1 week 3. DURATION: "How long does it last" (e.g., seconds, minutes, hours)     24 hours 4. PATTERN "Does it come and go, or has it been constant since it started?"  "Does it get worse with exertion?"   "Are you feeling it now?"     Unsteady heart rate- increased heart rate 5. TAP: "Using your hand, can you tap out what you are feeling on a chair or table in front of you, so that I can hear?" (Note: not all patients can do this)       N/a 6. HEART RATE: "Can you tell me your heart rate?" "How many beats in 15 seconds?"  (Note: not all patients can do this)       128-145- Apple watch 7. RECURRENT SYMPTOM: "Have you ever had this before?" If Yes, ask: "When was the last time?" and "What happened that time?"      History of Afib 8. CAUSE: "What do you think is causing the palpitations?"     Afib 9. CARDIAC HISTORY: "Do you have any history of heart disease?" (e.g., heart attack, angina, bypass surgery, angioplasty, arrhythmia)      Yes- irregular heart rate- Afib 10. OTHER SYMPTOMS: "Do you have any other symptoms?" (e.g., dizziness, chest pain, sweating, difficulty breathing)       Chest heaviness, SOB 11. PREGNANCY: "Is there any chance you are pregnant?" "When was your last menstrual period?"       N/a  Protocols used: Heart Rate and Heartbeat Questions-A-AH

## 2021-02-26 NOTE — ED Provider Notes (Addendum)
Emergency Medicine Provider Triage Evaluation Note  Kara Bush , a 80 y.o. female  was evaluated in triage.  Pt complains of palpitations.  She has a history of A. fib x3 years and is on Eliquis.  Heart rate has been in the 140s 150s for the past couple weeks.  No new changes in medication. She is feeling short of breath, but denies any chest pain.  No nausea or vomiting.  Review of Systems  Positive: Palpitations, atrial fibrillation, shortness of breath Negative: See above  Physical Exam  There were no vitals taken for this visit. Gen:   Awake, no distress   Resp:  Normal effort  MSK:   Moves extremities without difficulty  Other:  Tachycardic, heart rate irregularly irregular in A. fib  Medical Decision Making  Medically screening exam initiated at 11:07 AM.  Appropriate orders placed.  JAIEL HOWER was informed that the remainder of the evaluation will be completed by another provider, this initial triage assessment does not replace that evaluation, and the importance of remaining in the ED until their evaluation is complete.     Sherrill Raring, PA-C 02/26/21 1108    Sherrill Raring, PA-C 02/26/21 1110    Sherrill Raring, PA-C 02/26/21 1201    Davonna Belling, MD 02/26/21 1600

## 2021-02-27 ENCOUNTER — Observation Stay (HOSPITAL_BASED_OUTPATIENT_CLINIC_OR_DEPARTMENT_OTHER): Payer: PPO

## 2021-02-27 ENCOUNTER — Encounter: Payer: Self-pay | Admitting: Registered Nurse

## 2021-02-27 DIAGNOSIS — I4891 Unspecified atrial fibrillation: Secondary | ICD-10-CM | POA: Diagnosis not present

## 2021-02-27 DIAGNOSIS — I480 Paroxysmal atrial fibrillation: Secondary | ICD-10-CM

## 2021-02-27 LAB — ECHOCARDIOGRAM COMPLETE
AR max vel: 0.9 cm2
AV Area VTI: 0.97 cm2
AV Area mean vel: 0.89 cm2
AV Mean grad: 14 mmHg
AV Peak grad: 24 mmHg
Ao pk vel: 2.45 m/s
Area-P 1/2: 4.31 cm2
Height: 64 in
P 1/2 time: 455 msec
S' Lateral: 2.8 cm
Weight: 2906.54 oz

## 2021-02-27 LAB — CBC
HCT: 37 % (ref 36.0–46.0)
Hemoglobin: 11.4 g/dL — ABNORMAL LOW (ref 12.0–15.0)
MCH: 25.7 pg — ABNORMAL LOW (ref 26.0–34.0)
MCHC: 30.8 g/dL (ref 30.0–36.0)
MCV: 83.3 fL (ref 80.0–100.0)
Platelets: 195 10*3/uL (ref 150–400)
RBC: 4.44 MIL/uL (ref 3.87–5.11)
RDW: 14.1 % (ref 11.5–15.5)
WBC: 8.4 10*3/uL (ref 4.0–10.5)
nRBC: 0 % (ref 0.0–0.2)

## 2021-02-27 LAB — COMPREHENSIVE METABOLIC PANEL
ALT: 16 U/L (ref 0–44)
AST: 17 U/L (ref 15–41)
Albumin: 3.7 g/dL (ref 3.5–5.0)
Alkaline Phosphatase: 37 U/L — ABNORMAL LOW (ref 38–126)
Anion gap: 9 (ref 5–15)
BUN: 18 mg/dL (ref 8–23)
CO2: 30 mmol/L (ref 22–32)
Calcium: 9.2 mg/dL (ref 8.9–10.3)
Chloride: 103 mmol/L (ref 98–111)
Creatinine, Ser: 1.17 mg/dL — ABNORMAL HIGH (ref 0.44–1.00)
GFR, Estimated: 47 mL/min — ABNORMAL LOW (ref >60)
Glucose, Bld: 98 mg/dL (ref 70–99)
Potassium: 3.6 mmol/L (ref 3.5–5.1)
Sodium: 142 mmol/L (ref 135–145)
Total Bilirubin: 0.6 mg/dL (ref 0.3–1.2)
Total Protein: 6.1 g/dL — ABNORMAL LOW (ref 6.5–8.1)

## 2021-02-27 MED ORDER — METOPROLOL SUCCINATE ER 50 MG PO TB24
50.0000 mg | ORAL_TABLET | Freq: Every day | ORAL | Status: DC
  Administered 2021-02-27: 50 mg via ORAL
  Filled 2021-02-27: qty 1

## 2021-02-27 MED ORDER — HYDRALAZINE HCL 20 MG/ML IJ SOLN
10.0000 mg | Freq: Four times a day (QID) | INTRAMUSCULAR | Status: DC | PRN
  Filled 2021-02-27: qty 1

## 2021-02-27 MED ORDER — ONDANSETRON HCL 4 MG PO TABS: 4.0000 mg | ORAL_TABLET | Freq: Four times a day (QID) | ORAL | 0 refills | Status: DC | PRN

## 2021-02-27 MED ORDER — METOPROLOL SUCCINATE ER 50 MG PO TB24: 50.0000 mg | ORAL_TABLET | Freq: Every day | ORAL | 0 refills | Status: DC

## 2021-02-27 MED ORDER — ALBUTEROL SULFATE (2.5 MG/3ML) 0.083% IN NEBU
INHALATION_SOLUTION | RESPIRATORY_TRACT | Status: AC
  Filled 2021-02-27: qty 3

## 2021-02-27 NOTE — Telephone Encounter (Signed)
Patient was hospitalized for atrial flutter (see notes) - phone message about palpitations was routed accidentally to refill pool rather than triage and was not addressed, therefore, patient went to the hospital. Please follow-up on why this message was mis-routed.  Dr. Debara Pickett

## 2021-02-27 NOTE — Progress Notes (Signed)
  Echocardiogram 2D Echocardiogram has been performed.  Randa Lynn Caylor [place] 02/27/2021, 2:33 PM

## 2021-02-27 NOTE — Consult Note (Addendum)
Cardiology Consultation:   Patient ID: Kara Bush MRN: [id number]; DOB: July 09, 1941  Admit date: 02/26/2021 Date of Consult: 02/27/2021  PCP:  Aretta Nip, MD   [place] [facility] Cardiologist:  Pixie Casino, MD        Patient Profile:   Kara Bush is a 80 y.o. female with a hx of CAD s/p CABG 1998, PAD s/p aortic iliac bypass, TAA, h/o PE 09/21/2012, PAF diagnosed in 09/2018, HTN and HLD who is being seen 02/27/2021 for the evaluation of atrial flutter with RVR at the request of Dr. Alfredia Ferguson.  History of Present Illness:   [name] is a pleasant 80 year old female with past medical history of CAD s/p CABG 1998, PAD s/p aortic iliac bypass, TAA, h/o PE 09/21/2012, PAF diagnosed in 09/2018, HTN and HLD.  She underwent stenting in 2005, 2006 and Dec 2007.  Myoview was negative in 2013.  She was diagnosed with atrial fibrillation in February 2020.  She self converted on rate controlling medication.  Over the past 2 years, she has been noticing occasional short episode of A. fib on her apple watch.  Echocardiogram obtained in January 2019 showed EF 60 to 65%, grade 1 DD, mild AI and mild MR.  She was last seen by Dr. Debara Pickett in December 2021, metoprolol succinate was increased to 25 mg daily.  CTA in February 2022 showed dilated thoracic aortic aneurysm at 4.1 cm, repeat study in 1 year was recommended.  Patient presented [facility] on 02/26/2021 after noticing she was in atrial fibrillation for the past week.  She says her heart rate was in the 130s to 150s for the past week.  On arrival to [facility], her heart rate was actually in the 110s to 130s.  Serial troponin was negative.  BNP was borderline elevated at 263.1.  COVID test negative.  Chest x-ray showed trace amount of fluid within the minor fissure, similar on the prior study, no focal airspace consolidation.  EKG is actually more consistent with atrial flutter rather than atrial fibrillation.   She was given a single dose of metoprolol tartrate 25 mg in the emergency room.  She converted back to sinus rhythm around 6:25 PM yesterday.  Cardiology service has been consulted for atrial fibrillation.  By the time cardiology service interviewed the patient, she has been maintaining sinus rhythm for 12 hours.   Past Medical History:  Diagnosis Date   Abdominal aortic aneurysm ([place])    Anemia, improved 01/11/2013   Basal cell carcinoma    CAD (coronary artery disease)    hx CABG 1998, last cath 2007 with PCI and stenting to the proximal segment of the vein graft to the diagonal branch. DES Taxus was placed   Cervicalgia    Chest wall pain    Chronic fatigue fibromyalgia syndrome    Chronic pain syndrome    Coronary artery disease    Depression    Fibromyalgia    Fibromyalgia    History of nuclear stress test 08/07/2011   lexiscan; no evidence of ischemia or infarct; low risk    Hyperlipidemia    Hypertension    Hypertension    Obstructive sleep apnea    OSA (obstructive sleep apnea)    PAD (peripheral artery disease) (HCC)    Pes anserine bursitis    Pulmonary embolism ([place]) 01/11/2013   S/P resection of aortic aneurysm    Trochanteric bursitis    Ulcerative colitis ([place])     Past  Surgical History:  Procedure Laterality Date   AORTOBIEXTERNAL ILIAC BYPASS  1991   CATARACT EXTRACTION Left 09/2019   CHOLECYSTECTOMY  1997   CORONARY ANGIOPLASTY WITH STENT PLACEMENT  2006   to LAD, ATRETIC LIMA AT THAT TIME ALSO   CORONARY ANGIOPLASTY WITH STENT PLACEMENT  2007   STENT TO PROX VG-DIAG, OTHER STENTS PATENT   CORONARY ARTERY BYPASS GRAFT  1998   LIMA-LAD;VG-DIAG & RCA   CORONARY STENT PLACEMENT  2005   TO LAD & LCX-STENTS,   HERNIA REPAIR     TUBAL LIGATION     BTL     Home Medications:  Prior to Admission medications   Medication Sig Start Date End Date Taking? Authorizing Provider  b complex vitamins capsule Take 1 capsule by mouth daily.   Yes [provider]  citalopram (CELEXA) 20 MG tablet Take 20 mg by mouth daily.  11/03/16  Yes [provider]  diclofenac sodium (VOLTAREN) 1 % GEL Apply 1 application topically daily as needed (joint pain).   Yes [provider]  ELIQUIS 5 MG TABS tablet TAKE ONE TABLET BY MOUTH TWICE DAILY Patient taking differently: Take 5 mg by mouth 2 (two) times daily. 01/28/21  Yes Hilty, Nadean Corwin, MD  ezetimibe (ZETIA) 10 MG tablet TAKE ONE TABLET BY MOUTH ONCE DAILY Patient taking differently: Take 10 mg by mouth daily. 01/28/21  Yes Hilty, Nadean Corwin, MD  HYDROcodone-acetaminophen (NORCO/VICODIN) 5-325 MG tablet Take 1 tablet by mouth 4 (four) times daily as needed for moderate pain. May Take an extra 0.5 to one tablet when pain is severe. Patient taking differently: Take 0.5-1 tablets by mouth 5 (five) times daily as needed for moderate pain. 02/25/21  Yes Bayard Hugger, NP  methocarbamol (ROBAXIN) 500 MG tablet Take 1 tablet (500 mg total) by mouth every 8 (eight) hours as needed for muscle spasms. 04/04/20  Yes Bayard Hugger, NP  metoprolol succinate (TOPROL-XL) 25 MG 24 hr tablet TAKE ONE TABLET BY MOUTH ONCE DAILY Patient taking differently: Take 25 mg by mouth daily. 01/28/21  Yes Hilty, Nadean Corwin, MD  Multiple Vitamin (MULITIVITAMIN WITH MINERALS) TABS Take 1 tablet by mouth daily.   Yes [provider]  simvastatin (ZOCOR) 40 MG tablet TAKE ONE TABLET BY MOUTH ONCE DAILY Patient taking differently: Take 40 mg by mouth daily. 01/28/21  Yes Hilty, Nadean Corwin, MD  valsartan (DIOVAN) 80 MG tablet TAKE ONE TABLET BY MOUTH ONCE DAILY Patient taking differently: Take 80 mg by mouth daily. 01/28/21  Yes Hilty, Nadean Corwin, MD  zolpidem (AMBIEN) 5 MG tablet Take 5 mg by mouth at bedtime. 05/08/18  Yes [provider]    Inpatient Medications: Scheduled Meds:  albuterol       apixaban  5 mg Oral BID   B-complex with vitamin C  1 tablet Oral Daily   citalopram  20 mg Oral Daily    ezetimibe  10 mg Oral Daily   irbesartan  75 mg Oral Daily   metoprolol succinate  50 mg Oral Daily   multivitamin with minerals  1 tablet Oral Daily   simvastatin  40 mg Oral Daily   zolpidem  5 mg Oral QHS   Continuous Infusions:  PRN Meds: hydrALAZINE, HYDROcodone-acetaminophen, methocarbamol, ondansetron **OR** ondansetron (ZOFRAN) IV  Allergies:    Allergies  Allergen Reactions   Biotin Hives   Lisinopril Cough   Atenolol Other (See Comments)    colitis   Bactrim [Sulfamethoxazole-Trimethoprim] Nausea And Vomiting  Cymbalta [Duloxetine Hcl] Other (See Comments)    Mad pt feel spacey   Lyrica [Pregabalin] Other (See Comments)    Made pt feel spacey   Penicillins Rash    Social History:   Social History   Socioeconomic History   Marital status: Divorced    Spouse name: Not on file   Number of children: 2   Years of education: college    Highest education level: Not on file  Occupational History    Employer: RETIRED  Tobacco Use   Smoking status: Former    Packs/day: 2.50    Years: 20.00    Pack years: 50.00    Types: Cigarettes    Quit date: 06/29/1990    Years since quitting: 30.6   Smokeless tobacco: Never  Vaping Use   Vaping Use: Never used  Substance and Sexual Activity   Alcohol use: No   Drug use: No   Sexual activity: Not on file  Other Topics Concern   Not on file  Social History Narrative   Not on file   Social Determinants of Health   Financial Resource Strain: Not on file  Food Insecurity: Not on file  Transportation Needs: Not on file  Physical Activity: Not on file  Stress: Not on file  Social Connections: Not on file  Intimate Partner Violence: Not on file    Family History:    Family History  Problem Relation Age of Onset   Heart disease Father    Heart attack Father        @ 54   Cancer Paternal Aunt        BREAST   Heart failure Mother        at 23   Healthy Sister    Cancer Brother        pancreatic    Heart  failure Maternal Grandmother    Heart attack Maternal Grandfather    Heart attack Paternal Grandmother        at 30   Healthy Daughter    Healthy Daughter      ROS:  Please see the history of present illness.   All other ROS reviewed and negative.     Physical Exam/Data:   Vitals:   02/26/21 2335 02/27/21 0407 02/27/21 0741 02/27/21 0935  BP: (!) 149/59 (!) 184/106 (!) 184/64 (!) 172/80  Pulse: 71 80 81 90  Resp: '14 16 17   '$ Temp: 98.1 F (36.7 C) 98.2 F (36.8 C) 97.7 F (36.5 C)   TempSrc: Oral Oral Oral   SpO2: 97% 98% 95%   Weight:      Height:        Intake/Output Summary (Last 24 hours) at 02/27/2021 0940 Last data filed at 02/27/2021 0600 Gross per 24 hour  Intake 480 ml  Output --  Net 480 ml   Last 3 Weights 02/26/2021 02/26/2021 02/25/2021  Weight (lbs) 181 lb 10.5 oz 184 lb 185 lb 6.4 oz  Weight (kg) 82.4 kg 83.462 kg 84.097 kg     Body mass index is 31.18 kg/m.  General:  Well nourished, well developed, in no acute distress HEENT: normal Lymph: no adenopathy Neck: no JVD Endocrine:  No thryomegaly Vascular: No carotid bruits; FA pulses 2+ bilaterally without bruits  Cardiac:  normal S1, S2; RRR; no murmur  Lungs:  clear to auscultation bilaterally, no wheezing, rhonchi or rales  Abd: soft, nontender, no hepatomegaly  Ext: no edema Musculoskeletal:  No deformities, BUE and BLE strength normal and  equal Skin: warm and dry  Neuro:  CNs 2-12 intact, no focal abnormalities noted Psych:  Normal affect   EKG:  The EKG was personally reviewed and demonstrates:  atrial flutter with RVR Telemetry:  Telemetry was personally reviewed and demonstrates:  Converted to NSR around 18:25 yesterday, currently maintaining sinus rhythm with occasional PVCs  Relevant CV Studies:  Echo 08/20/2017 LV EF: 60% -   65%  Study Conclusions   - Left ventricle: The cavity size was normal. Wall thickness was    normal. Systolic function was normal. The estimated ejection     fraction was in the range of 60% to 65%. Wall motion was normal;    there were no regional wall motion abnormalities. Doppler    parameters are consistent with abnormal left ventricular    relaxation (grade 1 diastolic dysfunction).  - Aortic valve: There was mild regurgitation.  - Mitral valve: There was mild regurgitation.  - Right atrium: The atrium was mildly dilated.   Laboratory Data:  High Sensitivity Troponin:   Recent Labs  Lab 02/26/21 1212 02/26/21 1825  TROPONINIHS 7 9     Chemistry Recent Labs  Lab 02/26/21 1135 02/27/21 0351  NA 140 142  K 4.0 3.6  CL 105 103  CO2 27 30  GLUCOSE 134* 98  BUN 19 18  CREATININE 1.24* 1.17*  CALCIUM 9.2 9.2  GFRNONAA 44* 47*  ANIONGAP 8 9    Recent Labs  Lab 02/26/21 1135 02/27/21 0351  PROT 6.6 6.1*  ALBUMIN 3.9 3.7  AST 22 17  ALT 18 16  ALKPHOS 43 37*  BILITOT 0.6 0.6   Hematology Recent Labs  Lab 02/26/21 1135 02/27/21 0351  WBC 9.6 8.4  RBC 4.98 4.44  HGB 12.9 11.4*  HCT 45.4 37.0  MCV 91.2 83.3  MCH 25.9* 25.7*  MCHC 28.4* 30.8  RDW 14.3 14.1  PLT 192 195   BNP Recent Labs  Lab 02/26/21 1314  BNP 263.1*    DDimer No results for input(s): DDIMER in the last 168 hours.   Radiology/Studies:  DG Chest 2 View  Result Date: 02/26/2021 CLINICAL DATA:  Shortness of breath EXAM: CHEST - 2 VIEW COMPARISON:  07/31/2011, 09/10/2020 FINDINGS: Post CABG changes. Heart size is upper limits of normal. Atherosclerotic calcification of the aortic knob. Trace amount of fluid within the minor fissure, similar to prior CT. No focal airspace consolidation. No left-sided pleural effusion. No pneumothorax. IMPRESSION: Trace amount of fluid within the minor fissure, similar to prior. No focal airspace consolidation. Electronically Signed   By: Davina Poke D.O.   On: 02/26/2021 13:05     Assessment and Plan:   Atrial flutter with RVR: History of atrial fibrillation diagnosed in February 2022 and has been  compliant with Eliquis and 25 mg daily of metoprolol succinate at home.  She started noticing atrial flutter with RVR with elevated heart rate about a week ago.  She was given a single dose of metoprolol tartrate 25 mg in the emergency room and converted around 6:25 PM yesterday.  Currently she is maintaining sinus rhythm.  She does not have full cardiac awareness of atrial fibrillation.  She is not in acute heart failure on exam.  I recommended a repeat echocardiogram.  We will increase home metoprolol succinate to 50 mg daily with instructions to take an additional 25 mg on an as-needed basis if she does have any recurrent A. fib.  If echocardiogram shows normal EF, she may be discharged later today.  CAD s/p CABG 1998: Denies any recent chest pain.  PAD s/p aortic iliac bypass  Thoracic aortic aneurysm: Last CTA of chest obtained in February 2022 showed 4.1 cm dilatation.  Her blood pressure has been elevated in the hospital.  Emphasized on the importance of blood pressure control with thoracic aortic aneurysm.  History of PE February 2014  Hypertension  Hyperlipidemia   Risk Assessment/Risk Scores:          CHA2DS2-VASc Score = 5  This indicates a 7.2% annual risk of stroke. The patient's score is based upon: CHF History: No HTN History: Yes Diabetes History: No Stroke History: No Vascular Disease History: Yes Age Score: 2 Gender Score: 1        For questions or updates, please contact [place] Please consult [email or web address] for contact info under    Signed, Almyra Deforest, [place]  02/27/2021 9:40 AM  Personally seen and examined. Agree with above.  80 year old with atrial flutter rapid ventricular response with coronary artery disease status post CABG in 1998 PAD status post aorto iliac bypass with thoracic aortic aneurysm mild, history of PE in 2014 with hypertension hyperlipidemia.  She is currently feeling well comfortably laying in bed no shortness of breath no chest  pain no palpitations.  Telemetry personally reviewed reveals sinus rhythm.  Her granddaughter is coming in from [place].  She would like to be home for her tonight.  EKG shows heart rate of 123 with coarse atrial flutter pattern.  Does not have completely typical pattern with somewhat positive sawtooth confirmation in the inferior leads.  GEN: Well nourished, well developed, in no acute distress HEENT: normal Neck: no JVD, carotid bruits, or masses Cardiac: RRR; no murmurs, rubs, or gallops,no edema  Respiratory:  clear to auscultation bilaterally, normal work of breathing GI: soft, nontender, nondistended, + BS MS: no deformity or atrophy Skin: warm and dry, no rash Neuro:  Alert and Oriented x 3, Strength and sensation are intact Psych: euthymic mood, full affect   Atrial flutter with rapid ventricular response - Atrial fibrillation was diagnosed back in February 2022.  She has been on Eliquis as well as metoprolol at home.  Proxy 1 week ago her apple watch has been detecting some atrial arrhythmias.  She was given a single dose of metoprolol tartrate 25 mg in the ER, converted at 625 yesterday.  She is now in sinus rhythm.  No heart failure.  Echocardiogram is currently pending. - Agree with increasing home metoprolol succinate to 50 mg daily and to have 25 as needed if needed. - Agree that if ECHO is unremarkable, she may be discharged.  Coronary artery disease - Post CABG 1998 without any anginal symptoms.  Continue with goal-directed therapy.  PAD - She had aortoiliac bypass.  No claudication.  Hypertension and hyperlipidemia are adequately treated.  Chronic anticoagulation - CHA2DS2-VASc 5.  Continue with Eliquis.  She has not missed any doses.  Candee Furbish, MD

## 2021-02-27 NOTE — Progress Notes (Signed)
Patient discharged home, discharge instructions given and explained to patient, she verbalized understanding, patient denies any pain/distress. No pressure injury noted. Patient accompanied home by daughter.

## 2021-02-27 NOTE — Plan of Care (Signed)
  Problem: Education: Goal: Knowledge of General Education information will improve Description: Including pain rating scale, medication(s)/side effects and non-pharmacologic comfort measures Outcome: Progressing   Problem: Clinical Measurements: Goal: Diagnostic test results will improve Outcome: Progressing   

## 2021-02-27 NOTE — Discharge Summary (Signed)
Physician Discharge Summary  Kara Bush [id number] DOB: 15-Jul-1941 DOA: 02/26/2021  PCP: Aretta Nip, MD  Admit date: 02/26/2021 Discharge date: 02/27/2021  Admitted From: Home Disposition: Home  Recommendations for Outpatient Follow-up:  Follow up with PCP in 1-2 weeks Follow up with Cardiology Dr. Debara Pickett within 1-2 weeks  Please obtain CMP/CBC, Mag, Phos in one week Please follow up on the following pending results:  Home Health: No  Equipment/Devices: None  Discharge Condition: Stable  CODE STATUS: FULL CODE Diet recommendation:  Heart Healthy Diet   Brief/Interim Summary: The patient is an 80 year old female with a past medical history significant for A. fib, hypertension, hyperlipidemia who presented with A. fib with RVR.  Reports that her Apple Watch evaluating her A. fib status quite often for the last week.  She started feeling weakness and lightheadedness during that time.  She had no chest pain.  She does have some dyspnea with activity.  She reported compliance with her normal regimen and she called her cardiologist office but because she could not get through to them she became concerned that she is going to have many runs of A. fib so she decided come to the ED for further evaluation.  In the ED her home regimen was initiated and her metoprolol was titrated.  Cardiology was consulted and she subsequently converted normal sinus rhythm.  Cardiology recommended checking an echo which was normal.  They recommended discharge and follow-up with her primary cardiologist on the increased dose of metoprolol.  Discharge Diagnoses:  Active Problems:   A-fib (HCC)  A fib RVR/A flutter now in NSR      - places in obs tele     - home regimen resumed; she received and additional dose of metoprolol; Now Metoprolol increased to 50 mg      - EDP spoke with cardiology; Cardiology recommeneded Anticoagulation and Metoprolol Succinate at 50 mg     - continue eliquis given  that her CHA2DS2-VASc score is 5     - BP is stable, she denies CP     -Follow up with Primary Cardiologist at D/C  Acquired thrombophilia -In the setting of above from A. 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   Weight as of this encounter: 82.4 kg. -Weight Loss and Dietary Counseling given   Discharge Instructions Discharge Instructions     Call MD for:  difficulty breathing, headache or visual disturbances   Complete by: As directed    Call MD for:  extreme fatigue   Complete by: As directed    Call MD for:  hives   Complete by: As directed    Call MD for:  persistant dizziness or light-headedness  Complete by: As directed    Call MD for:  persistant nausea and vomiting   Complete by: As directed    Call MD for:  redness, tenderness, or signs of infection (pain, swelling, redness, odor or green/yellow discharge around incision site)   Complete by: As directed    Call MD for:  severe uncontrolled pain   Complete by: As directed    Call MD for:  temperature >100.4   Complete by: As directed    Diet - low sodium heart healthy   Complete by: As directed    Discharge instructions   Complete by: As directed    You were cared for by a hospitalist during your  hospital stay. If you have any questions about your discharge medications or the care you received while you were in the hospital after you are discharged, you can call the unit and ask to speak with the hospitalist on call if the hospitalist that took care of you is not available. Once you are discharged, your primary care physician will handle any further medical issues. Please note that NO REFILLS for any discharge medications will be authorized once you are discharged, as it is imperative that you return to your primary care physician (or establish a relationship with a primary care physician if you do not have one) for your aftercare needs so that they can reassess your need for medications and monitor your lab values.  Follow up with PCP and Cardiology within 1-2 weeks. Take all medications as prescribed. If symptoms change or worsen please return to the ED for evaluation   Increase activity slowly   Complete by: As directed       Allergies as of 02/27/2021       Reactions   Biotin Hives   Lisinopril Cough   Atenolol Other (See Comments)   colitis   Bactrim [sulfamethoxazole-trimethoprim] Nausea And Vomiting   Cymbalta [duloxetine Hcl] Other (See Comments)   Mad pt feel spacey   Lyrica [pregabalin] Other (See Comments)   Made pt feel spacey   Penicillins Rash        Medication List     TAKE these medications    b complex vitamins capsule Take 1 capsule by mouth daily.   citalopram 20 MG tablet Commonly known as: CELEXA Take 20 mg by mouth daily.   diclofenac sodium 1 % Gel Commonly known as: VOLTAREN Apply 1 application topically daily as needed (joint pain).   Eliquis 5 MG Tabs tablet Generic drug: apixaban TAKE ONE TABLET BY MOUTH TWICE DAILY What changed: how much to take   ezetimibe 10 MG tablet Commonly known as: ZETIA TAKE ONE TABLET BY MOUTH ONCE DAILY   HYDROcodone-acetaminophen 5-325 MG tablet Commonly known as: NORCO/VICODIN Take 1 tablet by mouth 4  (four) times daily as needed for moderate pain. May Take an extra 0.5 to one tablet when pain is severe. What changed:  how much to take when to take this additional instructions   methocarbamol 500 MG tablet Commonly known as: ROBAXIN Take 1 tablet (500 mg total) by mouth every 8 (eight) hours as needed for muscle spasms.   metoprolol succinate 50 MG 24 hr tablet Commonly known as: TOPROL-XL Take 1 tablet (50 mg total) by mouth daily. Take with or immediately following a meal. Start taking on: February 28, 2021 What changed:  medication strength how much to take additional instructions   multivitamin with minerals Tabs tablet Take 1 tablet by mouth daily.   ondansetron  4 MG tablet Commonly known as: ZOFRAN Take 1 tablet (4 mg total) by mouth every 6 (six) hours as needed for nausea.   simvastatin 40 MG tablet Commonly known as: ZOCOR TAKE ONE TABLET BY MOUTH ONCE DAILY   valsartan 80 MG tablet Commonly known as: DIOVAN TAKE ONE TABLET BY MOUTH ONCE DAILY   zolpidem 5 MG tablet Commonly known as: AMBIEN Take 5 mg by mouth at bedtime.        Follow-up Information     Rankins, Bill Salinas, MD. Call.   Specialty: Family Medicine Contact information: [place] [address] [phone]         Pixie Casino, MD .   Specialty: Cardiology Contact information: [address] [place] [address] [place] [address] [phone]                Allergies  Allergen Reactions   Biotin Hives   Lisinopril Cough   Atenolol Other (See Comments)    colitis   Bactrim [Sulfamethoxazole-Trimethoprim] Nausea And Vomiting   Cymbalta [Duloxetine Hcl] Other (See Comments)    Mad pt feel spacey   Lyrica [Pregabalin] Other (See Comments)    Made pt feel spacey   Penicillins Rash    Consultations: Cardiology  Procedures/Studies: DG Chest 2 View  Result Date: 02/26/2021 CLINICAL DATA:  Shortness of breath EXAM: CHEST - 2 VIEW COMPARISON:   07/31/2011, 09/10/2020 FINDINGS: Post CABG changes. Heart size is upper limits of normal. Atherosclerotic calcification of the aortic knob. Trace amount of fluid within the minor fissure, similar to prior CT. No focal airspace consolidation. No left-sided pleural effusion. No pneumothorax. IMPRESSION: Trace amount of fluid within the minor fissure, similar to prior. No focal airspace consolidation. Electronically Signed   By: Davina Poke D.O.   On: 02/26/2021 13:05   ECHOCARDIOGRAM COMPLETE  Result Date: 02/27/2021    ECHOCARDIOGRAM REPORT   Patient Name:   Kara Bush Date of Exam: 02/27/2021 Medical Rec #:  [id number]          Height:       64.0 in Accession #:    [id number]         Weight:       181.7 lb Date of Birth:  04-01-1941         BSA:          1.878 m Patient Age:    24 years           BP:           151/71 mmHg Patient Gender: F                  HR:           67 bpm. Exam Location:  Inpatient Procedure: 2D Echo, Cardiac Doppler and Color Doppler Indications:    I48.0 Paroxysmal atrial fibrillation  History:        Patient has prior history of Echocardiogram examinations, most                 recent 08/20/2017. CAD, PAD, Signs/Symptoms:Chest Pain; Risk                 Factors:Hypertension, Dyslipidemia and Sleep Apnea. Pulmonary                 Embolism.  Sonographer:    Tiffany Dance Referring Phys: [id number] HAO MENG IMPRESSIONS  1. Left ventricular ejection fraction, by estimation, is 55 to 60%. The left ventricle has  normal function. The left ventricle has no regional wall motion abnormalities. There is moderate left ventricular hypertrophy. Left ventricular diastolic parameters were normal.  2. Right ventricular systolic function is normal. The right ventricular size is normal. There is mildly elevated pulmonary artery systolic pressure. The estimated right ventricular systolic pressure is 123456 mmHg.  3. The mitral valve is normal in structure. Mild mitral valve regurgitation.  4. The  aortic valve is tricuspid. There is moderate calcification of the aortic valve. Aortic valve regurgitation is mild. Mild to moderate aortic valve stenosis. Vmax 2.6 m/s, MG 15 mmHg, AVA 1.0 cm^2, DI 0.36  5. Aortic dilatation noted. There is mild dilatation of the ascending aorta, measuring 38 mm.  6. The inferior vena cava is dilated in size with >50% respiratory variability, suggesting right atrial pressure of 8 mmHg. FINDINGS  Left Ventricle: Left ventricular ejection fraction, by estimation, is 55 to 60%. The left ventricle has normal function. The left ventricle has no regional wall motion abnormalities. The left ventricular internal cavity size was normal in size. There is  moderate left ventricular hypertrophy. Left ventricular diastolic parameters were normal. Right Ventricle: The right ventricular size is normal. No increase in right ventricular wall thickness. Right ventricular systolic function is normal. There is mildly elevated pulmonary artery systolic pressure. The tricuspid regurgitant velocity is 2.69  m/s, and with an assumed right atrial pressure of 8 mmHg, the estimated right ventricular systolic pressure is 123456 mmHg. Left Atrium: Left atrial size was normal in size. Right Atrium: Right atrial size was normal in size. Pericardium: There is no evidence of pericardial effusion. Mitral Valve: The mitral valve is normal in structure. Mild mitral valve regurgitation. Tricuspid Valve: The tricuspid valve is normal in structure. Tricuspid valve regurgitation is trivial. Aortic Valve: The aortic valve is tricuspid. There is moderate calcification of the aortic valve. Aortic valve regurgitation is mild. Aortic regurgitation PHT measures 455 msec. Mild to moderate aortic stenosis is present. Aortic valve mean gradient measures 14.0 mmHg. Aortic valve peak gradient measures 24.0 mmHg. Aortic valve area, by VTI measures 0.97 cm. Pulmonic Valve: The pulmonic valve was not well visualized. Pulmonic valve  regurgitation is not visualized. Aorta: The aortic root is normal in size and structure and aortic dilatation noted. There is mild dilatation of the ascending aorta, measuring 38 mm. Venous: The inferior vena cava is dilated in size with greater than 50% respiratory variability, suggesting right atrial pressure of 8 mmHg. IAS/Shunts: No atrial level shunt detected by color flow Doppler.  LEFT VENTRICLE PLAX 2D LVIDd:         3.70 cm LVIDs:         2.80 cm LV PW:         1.70 cm LV IVS:        1.10 cm LVOT diam:     1.90 cm LV SV:         55 LV SV Index:   29 LVOT Area:     2.84 cm  RIGHT VENTRICLE          IVC RV Basal diam:  2.60 cm  IVC diam: 2.10 cm TAPSE (M-mode): 2.0 cm LEFT ATRIUM             Index       RIGHT ATRIUM           Index LA diam:        3.90 cm 2.08 cm/m  RA Area:     14.50 cm LA Vol (  A2C):   52.0 ml 27.69 ml/m RA Volume:   30.00 ml  15.97 ml/m LA Vol (A4C):   59.4 ml 31.63 ml/m LA Biplane Vol: 56.4 ml 30.03 ml/m  AORTIC VALVE AV Area (Vmax):    0.90 cm AV Area (Vmean):   0.89 cm AV Area (VTI):     0.97 cm AV Vmax:           244.80 cm/s AV Vmean:          177.400 cm/s AV VTI:            0.565 m AV Peak Grad:      24.0 mmHg AV Mean Grad:      14.0 mmHg LVOT Vmax:         77.85 cm/s LVOT Vmean:        55.850 cm/s LVOT VTI:          0.194 m LVOT/AV VTI ratio: 0.34 AI PHT:            455 msec  AORTA Ao Root diam: 3.90 cm Ao Asc diam:  3.80 cm MITRAL VALVE                TRICUSPID VALVE MV Area (PHT): 4.31 cm     TR Peak grad:   28.9 mmHg MV Decel Time: 176 msec     TR Vmax:        269.00 cm/s MV E velocity: 119.00 cm/s MV A velocity: 113.00 cm/s  SHUNTS MV E/A ratio:  1.05         Systemic VTI:  0.19 m                             Systemic Diam: 1.90 cm Oswaldo Milian MD Electronically signed by Oswaldo Milian MD Signature Date/Time: 02/27/2021/2:56:35 PM    Final      Subjective: Examined at bedside and felt well and was in normal sinus rhythm.  Any complaints and ready to go  home.  Cardiology follow-up with the patient outpatient setting   Discharge Exam: Vitals:   02/27/21 1040 02/27/21 1330  BP: (!) 151/71 (!) 156/81  Pulse: 72 76  Resp:  18  Temp:  97.9 F (36.6 C)  SpO2:  95%   Vitals:   02/27/21 0741 02/27/21 0935 02/27/21 1040 02/27/21 1330  BP: (!) 184/64 (!) 172/80 (!) 151/71 (!) 156/81  Pulse: 81 90 72 76  Resp: 17   18  Temp: 97.7 F (36.5 C)   97.9 F (36.6 C)  TempSrc: Oral   Oral  SpO2: 95%   95%  Weight:      Height:       General: Pt is alert, awake, not in acute distress Cardiovascular: RRR, S1/S2 +, no rubs, no gallops Respiratory: Diminished bilaterally, no wheezing, no rhonchi; unlabored breathing Abdominal: Soft, NT, distended secondary to body habitus, bowel sounds + Extremities: Trace edema, no cyanosis  The results of significant diagnostics from this hospitalization (including imaging, microbiology, ancillary and laboratory) are listed below for reference.     Microbiology: Recent Results (from the past 240 hour(s))  Resp Panel by RT-PCR (Flu A&B, Covid) Nasopharyngeal Swab     Status: None   Collection Time: 02/26/21  1:53 PM   Specimen: Nasopharyngeal Swab; Nasopharyngeal(NP) swabs in vial transport medium  Result Value Ref Range Status   SARS Coronavirus 2 by RT PCR NEGATIVE NEGATIVE Final    Comment: (NOTE) SARS-CoV-2 target  nucleic acids are NOT DETECTED.  The SARS-CoV-2 RNA is generally detectable in upper respiratory specimens during the acute phase of infection. The lowest concentration of SARS-CoV-2 viral copies this assay can detect is 138 copies/mL. A negative result does not preclude SARS-Cov-2 infection and should not be used as the sole basis for treatment or other patient management decisions. A negative result may occur with  improper specimen collection/handling, submission of specimen other than nasopharyngeal swab, presence of viral mutation(s) within the areas targeted by this assay, and  inadequate number of viral copies(<138 copies/mL). A negative result must be combined with clinical observations, patient history, and epidemiological information. The expected result is Negative.  Fact Sheet for Patients:  EntrepreneurPulse.com.au  Fact Sheet for Healthcare Providers:  IncredibleEmployment.be  This test is no t yet approved or cleared by the [place] FDA and  has been authorized for detection and/or diagnosis of SARS-CoV-2 by FDA under an Emergency Use Authorization (EUA). This EUA will remain  in effect (meaning this test can be used) for the duration of the COVID-19 declaration under Section 564(b)(1) of the Act, 21 U.S.C.section 360bbb-3(b)(1), unless the authorization is terminated  or revoked sooner.       Influenza A by PCR NEGATIVE NEGATIVE Final   Influenza B by PCR NEGATIVE NEGATIVE Final    Comment: (NOTE) The Xpert Xpress SARS-CoV-2/FLU/RSV plus assay is intended as an aid in the diagnosis of influenza from Nasopharyngeal swab specimens and should not be used as a sole basis for treatment. Nasal washings and aspirates are unacceptable for Xpert Xpress SARS-CoV-2/FLU/RSV testing.  Fact Sheet for Patients: EntrepreneurPulse.com.au  Fact Sheet for Healthcare Providers: IncredibleEmployment.be  This test is not yet approved or cleared by the [place] FDA and has been authorized for detection and/or diagnosis of SARS-CoV-2 by FDA under an Emergency Use Authorization (EUA). This EUA will remain in effect (meaning this test can be used) for the duration of the COVID-19 declaration under Section 564(b)(1) of the Act, 21 U.S.C. section 360bbb-3(b)(1), unless the authorization is terminated or revoked.  Performed at [facility], [place] [address] [place], [place] [address]     Labs: BNP (last 3 results) Recent Labs    02/26/21 1314  BNP 263.1*    Basic Metabolic Panel: Recent Labs  Lab 02/26/21 1135 02/27/21 0351  NA 140 142  K 4.0 3.6  CL 105 103  CO2 27 30  GLUCOSE 134* 98  BUN 19 18  CREATININE 1.24* 1.17*  CALCIUM 9.2 9.2   Liver Function Tests: Recent Labs  Lab 02/26/21 1135 02/27/21 0351  AST 22 17  ALT 18 16  ALKPHOS 43 37*  BILITOT 0.6 0.6  PROT 6.6 6.1*  ALBUMIN 3.9 3.7   No results for input(s): LIPASE, AMYLASE in the last 168 hours. No results for input(s): AMMONIA in the last 168 hours. CBC: Recent Labs  Lab 02/26/21 1135 02/27/21 0351  WBC 9.6 8.4  NEUTROABS 5.7  --   HGB 12.9 11.4*  HCT 45.4 37.0  MCV 91.2 83.3  PLT 192 195   Cardiac Enzymes: No results for input(s): CKTOTAL, CKMB, CKMBINDEX, TROPONINI in the last 168 hours. BNP: Invalid input(s): POCBNP CBG: No results for input(s): GLUCAP in the last 168 hours. D-Dimer No results for input(s): DDIMER in the last 72 hours. Hgb A1c No results for input(s): HGBA1C in the last 72 hours. Lipid Profile No results for input(s): CHOL, HDL, LDLCALC, TRIG, CHOLHDL, LDLDIRECT in the last 72 hours. Thyroid function  studies No results for input(s): TSH, T4TOTAL, T3FREE, THYROIDAB in the last 72 hours.  Invalid input(s): FREET3 Anemia work up No results for input(s): VITAMINB12, FOLATE, FERRITIN, TIBC, IRON, RETICCTPCT in the last 72 hours. Urinalysis    Component Value Date/Time   COLORURINE YELLOW 11/20/2015 1757   APPEARANCEUR CLEAR 11/20/2015 1757   LABSPEC >1.046 (H) 11/20/2015 1757   PHURINE 6.0 11/20/2015 1757   GLUCOSEU NEGATIVE 11/20/2015 1757   HGBUR NEGATIVE 11/20/2015 1757   BILIRUBINUR NEGATIVE 11/20/2015 1757   KETONESUR NEGATIVE 11/20/2015 1757   PROTEINUR NEGATIVE 11/20/2015 1757   NITRITE NEGATIVE 11/20/2015 1757   LEUKOCYTESUR NEGATIVE 11/20/2015 1757   Sepsis Labs Invalid input(s): PROCALCITONIN,  WBC,  LACTICIDVEN Microbiology Recent Results (from the past 240 hour(s))  Resp Panel by RT-PCR (Flu A&B, Covid)  Nasopharyngeal Swab     Status: None   Collection Time: 02/26/21  1:53 PM   Specimen: Nasopharyngeal Swab; Nasopharyngeal(NP) swabs in vial transport medium  Result Value Ref Range Status   SARS Coronavirus 2 by RT PCR NEGATIVE NEGATIVE Final    Comment: (NOTE) SARS-CoV-2 target nucleic acids are NOT DETECTED.  The SARS-CoV-2 RNA is generally detectable in upper respiratory specimens during the acute phase of infection. The lowest concentration of SARS-CoV-2 viral copies this assay can detect is 138 copies/mL. A negative result does not preclude SARS-Cov-2 infection and should not be used as the sole basis for treatment or other patient management decisions. A negative result may occur with  improper specimen collection/handling, submission of specimen other than nasopharyngeal swab, presence of viral mutation(s) within the areas targeted by this assay, and inadequate number of viral copies(<138 copies/mL). A negative result must be combined with clinical observations, patient history, and epidemiological information. The expected result is Negative.  Fact Sheet for Patients:  EntrepreneurPulse.com.au  Fact Sheet for Healthcare Providers:  IncredibleEmployment.be  This test is no t yet approved or cleared by the [place] FDA and  has been authorized for detection and/or diagnosis of SARS-CoV-2 by FDA under an Emergency Use Authorization (EUA). This EUA will remain  in effect (meaning this test can be used) for the duration of the COVID-19 declaration under Section 564(b)(1) of the Act, 21 U.S.C.section 360bbb-3(b)(1), unless the authorization is terminated  or revoked sooner.       Influenza A by PCR NEGATIVE NEGATIVE Final   Influenza B by PCR NEGATIVE NEGATIVE Final    Comment: (NOTE) The Xpert Xpress SARS-CoV-2/FLU/RSV plus assay is intended as an aid in the diagnosis of influenza from Nasopharyngeal swab specimens and should not be  used as a sole basis for treatment. Nasal washings and aspirates are unacceptable for Xpert Xpress SARS-CoV-2/FLU/RSV testing.  Fact Sheet for Patients: EntrepreneurPulse.com.au  Fact Sheet for Healthcare Providers: IncredibleEmployment.be  This test is not yet approved or cleared by the [place] FDA and has been authorized for detection and/or diagnosis of SARS-CoV-2 by FDA under an Emergency Use Authorization (EUA). This EUA will remain in effect (meaning this test can be used) for the duration of the COVID-19 declaration under Section 564(b)(1) of the Act, 21 U.S.C. section 360bbb-3(b)(1), unless the authorization is terminated or revoked.  Performed at [facility], [place] [address], [place], [place] [address]    Time coordinating discharge: 35 minutes  SIGNED:  Kerney Elbe, DO [facility] 02/27/2021, 9:46 PM Pager is on [email or web address]  If 7PM-7AM, please contact night-coverage [email or web address]

## 2021-02-27 NOTE — Progress Notes (Signed)
Patient with 7runs of V-tach, patient in bed resting denies any chest pain, BP 172/80, HR-90, 18. Cardiology PA [facility] notified, no new order given, also Dr. Alfredia Ferguson informed, continue to monitor patient.

## 2021-02-27 NOTE — Progress Notes (Signed)
BP 188/77. Patient states headache of 4/10. On call provider made aware. No new orders placed. Will continue to monitor closely

## 2021-02-28 ENCOUNTER — Telehealth: Payer: Self-pay | Admitting: *Deleted

## 2021-02-28 LAB — TOXASSURE SELECT,+ANTIDEPR,UR

## 2021-02-28 NOTE — Telephone Encounter (Signed)
Urine drug screen for this encounter is consistent for prescribed medication 

## 2021-03-04 DIAGNOSIS — I10 Essential (primary) hypertension: Secondary | ICD-10-CM | POA: Diagnosis not present

## 2021-03-04 DIAGNOSIS — G4733 Obstructive sleep apnea (adult) (pediatric): Secondary | ICD-10-CM | POA: Diagnosis not present

## 2021-03-04 DIAGNOSIS — I4891 Unspecified atrial fibrillation: Secondary | ICD-10-CM | POA: Diagnosis not present

## 2021-03-04 DIAGNOSIS — N904 Leukoplakia of vulva: Secondary | ICD-10-CM | POA: Diagnosis not present

## 2021-03-05 ENCOUNTER — Telehealth: Payer: Self-pay | Admitting: Internal Medicine

## 2021-03-05 DIAGNOSIS — G4733 Obstructive sleep apnea (adult) (pediatric): Secondary | ICD-10-CM

## 2021-03-05 NOTE — Telephone Encounter (Signed)
Referral for Dr. Radford Pax, pt has OSA but not on CPAP. Can we place order for sleep study?

## 2021-03-19 ENCOUNTER — Telehealth: Payer: Self-pay | Admitting: Cardiology

## 2021-03-19 NOTE — Telephone Encounter (Signed)
Can an order for a sleep study be placed? Patient has a referral.

## 2021-03-22 NOTE — Telephone Encounter (Signed)
Can you place order for sleep study?

## 2021-03-27 NOTE — Progress Notes (Signed)
Cardiology Clinic Note   Patient Name: Kara Bush Date of Encounter: 03/28/2021  Primary Care Provider:  Aretta Nip, MD Primary Cardiologist:  Pixie Casino, MD  Patient Profile    Kara Bush 80 year old female presents the clinic today for follow-up evaluation of her atrial fibrillation with RVR.  She was admitted to the hospital on 02/26/2021 and discharged on 02/27/2021.  Past Medical History    Past Medical History:  Diagnosis Date   Abdominal aortic aneurysm ([place])    Anemia, improved 01/11/2013   Basal cell carcinoma    CAD (coronary artery disease)    hx CABG 1998, last cath 2007 with PCI and stenting to the proximal segment of the vein graft to the diagonal branch. DES Taxus was placed   Cervicalgia    Chest wall pain    Chronic fatigue fibromyalgia syndrome    Chronic pain syndrome    Coronary artery disease    Depression    Fibromyalgia    Fibromyalgia    History of nuclear stress test 08/07/2011   lexiscan; no evidence of ischemia or infarct; low risk    Hyperlipidemia    Hypertension    Hypertension    Obstructive sleep apnea    OSA (obstructive sleep apnea)    PAD (peripheral artery disease) ([facility])    Pes anserine bursitis    Pulmonary embolism ([place]) 01/11/2013   S/P resection of aortic aneurysm    Trochanteric bursitis    Ulcerative colitis ([place])    Past Surgical History:  Procedure Laterality Date   AORTOBIEXTERNAL ILIAC BYPASS  1991   CATARACT EXTRACTION Left 09/2019   CHOLECYSTECTOMY  1997   CORONARY ANGIOPLASTY WITH STENT PLACEMENT  2006   to LAD, ATRETIC LIMA AT THAT TIME ALSO   CORONARY ANGIOPLASTY WITH STENT PLACEMENT  2007   STENT TO PROX VG-DIAG, OTHER STENTS PATENT   CORONARY ARTERY BYPASS GRAFT  1998   LIMA-LAD;VG-DIAG & RCA   CORONARY STENT PLACEMENT  2005   TO LAD & LCX-STENTS,   HERNIA REPAIR     TUBAL LIGATION     BTL    Allergies  Allergies  Allergen Reactions   Biotin Hives   Lisinopril Cough    Atenolol Other (See Comments)    colitis   Bactrim [Sulfamethoxazole-Trimethoprim] Nausea And Vomiting   Cymbalta [Duloxetine Hcl] Other (See Comments)    Mad pt feel spacey   Lyrica [Pregabalin] Other (See Comments)    Made pt feel spacey   Penicillins Rash    History of Present Illness    [name] has a PMH of coronary artery disease status post CABG in 1998, PAD status post iliac bypass, TIA, PE 2/14, paroxysmal atrial fibrillation diagnosed in 2/20, HLD, and HTN.  Her echocardiogram 1/19 showed an EF 60-65%, G1 DD, mild AI, and mild MR.   She was seen by Dr. Debara Pickett 12/21.  During that time her metoprolol succinate was increased to 25 mg daily.  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   She presented to the emergency department at [facility] on 02/26/2021 and was discharged on 02/28/2021.  She presented with A. fib RVR.  She reported that she had been noticing atrial fibrillation on her apple watch for the past week.  She reported heart rates in the 130-150 range.  On arrival to [facility] heart rate was 110-130.  Her serial troponins were negative.  Her BNP was borderline at 263.1.  She was negative for COVID.  Her chest x-ray showed trace amount of fluid within minor fissure and was similar to prior studies.  Her EKG showed atrial flutter rather than atrial fibrillation.  She received a dose of metoprolol tartrate 25 mg in the emergency room.  She converted back to sinus rhythm.  By the time cardiology service examined the patient she had been in sinus rhythm for 12 hours.  She reported compliance with her apixaban.  Her metoprolol succinate was increased to 50 mg daily with instructions to take an additional 25 mg as needed.  Her echocardiogram 02/27/2021 showed an EF of 55-60%, moderate LVH, mild mitral valve regurgitation, trivial tricuspid valve regurgitation, and mild dilation of the ascending aortic root  at 38 mm.   She presents to the clinic today for follow-up evaluation states she feels well.  She has had 2 episodes of increased heart rate over the last month.  These were brief episodes and she returned to regular rate without intervention.  She reports that her blood pressure over the last 2 weeks has been elevated.  She is noticing blood pressures in the 150-1 160/80 range at home.  Initially in the office today her blood pressure was 160/80 and on follow-up it was 146/78.  She reports dietary indiscretion.  She does not have interest in changing her diet.  She also reports that she was previously a patient of Dr. Radford Pax and wore CPAP.  She has not worn her CPAP in a few years.  Her initial sleep evaluation she believes was over 13 years ago.  I will repeat her sleep evaluation, have her increase her physical activity as tolerated, give the salty 6 diet sheet, increase her valsartan, and have her follow-up after her sleep study.  Today she denies chest pain, shortness of breath, lower extremity edema, increased fatigue, palpitations, melena, hematuria, hemoptysis, diaphoresis, weakness, presyncope, syncope, orthopnea, and PND.   Home Medications    Prior to Admission medications   Medication Sig Start Date End Date Taking? Authorizing Provider  b complex vitamins capsule Take 1 capsule by mouth daily.    [provider]  citalopram (CELEXA) 20 MG tablet Take 20 mg by mouth daily.  11/03/16   [provider]  diclofenac sodium (VOLTAREN) 1 % GEL Apply 1 application topically daily as needed (joint pain).    [provider]  ELIQUIS 5 MG TABS tablet TAKE ONE TABLET BY MOUTH TWICE DAILY 01/28/21   Hilty, Nadean Corwin, MD  ezetimibe (ZETIA) 10 MG tablet TAKE ONE TABLET BY MOUTH ONCE DAILY 01/28/21   Hilty, Nadean Corwin, MD  HYDROcodone-acetaminophen (NORCO/VICODIN) 5-325 MG tablet Take 1 tablet by mouth 4 (four) times daily as needed for moderate pain. May Take an extra 0.5 to one  tablet when pain is severe. 02/25/21   Bayard Hugger, NP  methocarbamol (ROBAXIN) 500 MG tablet Take 1 tablet (500 mg total) by mouth every 8 (eight) hours as needed for muscle spasms. 04/04/20   Bayard Hugger, NP  metoprolol succinate (TOPROL-XL) 50 MG 24 hr tablet Take 1 tablet (50 mg total) by mouth daily. Take with or immediately following a meal. 02/28/21 03/30/21  Raiford Noble Latif, DO  Multiple Vitamin (MULITIVITAMIN WITH MINERALS) TABS Take 1 tablet by mouth daily.    [provider]  ondansetron (ZOFRAN) 4 MG tablet Take 1 tablet (4 mg total) by mouth every 6 (six) hours as needed for nausea. 02/27/21  Sheikh, Omair Latif, DO  simvastatin (ZOCOR) 40 MG tablet TAKE ONE TABLET BY MOUTH ONCE DAILY 01/28/21   Pixie Casino, MD  valsartan (DIOVAN) 80 MG tablet TAKE ONE TABLET BY MOUTH ONCE DAILY 01/28/21   Hilty, Nadean Corwin, MD  zolpidem (AMBIEN) 5 MG tablet Take 5 mg by mouth at bedtime. 05/08/18   [provider]    Family History    Family History  Problem Relation Age of Onset   Heart disease Father    Heart attack Father        @ 47   Cancer Paternal Aunt        BREAST   Heart failure Mother        at 55   Healthy Sister    Cancer Brother        pancreatic    Heart failure Maternal Grandmother    Heart attack Maternal Grandfather    Heart attack Paternal Grandmother        at 49   Healthy Daughter    Healthy Daughter    She indicated that her mother is deceased. She indicated that her father is deceased. She indicated that her sister is alive. She indicated that her brother is deceased. She indicated that her maternal grandmother is deceased. She indicated that her maternal grandfather is deceased. She indicated that her paternal grandmother is deceased. She indicated that her paternal grandfather is deceased. She indicated that both of her daughters are alive. She indicated that her paternal aunt is deceased.  Social History    Social History    Socioeconomic History   Marital status: Divorced    Spouse name: Not on file   Number of children: 2   Years of education: college    Highest education level: Not on file  Occupational History    Employer: RETIRED  Tobacco Use   Smoking status: Former    Packs/day: 2.50    Years: 20.00    Pack years: 50.00    Types: Cigarettes    Quit date: 06/29/1990    Years since quitting: 30.7   Smokeless tobacco: Never  Vaping Use   Vaping Use: Never used  Substance and Sexual Activity   Alcohol use: No   Drug use: No   Sexual activity: Not on file  Other Topics Concern   Not on file  Social History Narrative   Not on file   Social Determinants of Health   Financial Resource Strain: Not on file  Food Insecurity: Not on file  Transportation Needs: Not on file  Physical Activity: Not on file  Stress: Not on file  Social Connections: Not on file  Intimate Partner Violence: Not on file     Review of Systems    General:  No chills, fever, night sweats or weight changes.  Cardiovascular:  No chest pain, dyspnea on exertion, edema, orthopnea, palpitations, paroxysmal nocturnal dyspnea. Dermatological: No rash, lesions/masses Respiratory: No cough, dyspnea Urologic: No hematuria, dysuria Abdominal:   No nausea, vomiting, diarrhea, bright red blood per rectum, melena, or hematemesis Neurologic:  No visual changes, wkns, changes in mental status. All other systems reviewed and are otherwise negative except as noted above.  Physical Exam    VS:  BP (!) 146/78 (BP Location: Right Arm, Patient Position: Sitting, Cuff Size: Large)   Pulse 79   Ht '5\' 4"'$  (1.626 m)   Wt 184 lb 6.4 oz (83.6 kg)   SpO2 97%   BMI 31.65 kg/m  , BMI  Body mass index is 31.65 kg/m. GEN: Well nourished, well developed, in no acute distress. HEENT: normal. Neck: Supple, no JVD, carotid bruits, or masses. Cardiac: RRR, no murmurs, rubs, or gallops. No clubbing, cyanosis, edema.  Radials/DP/PT 2+ and equal  bilaterally.  Respiratory:  Respirations regular and unlabored, clear to auscultation bilaterally. GI: Soft, nontender, nondistended, BS + x 4. MS: no deformity or atrophy. Skin: warm and dry, no rash. Neuro:  Strength and sensation are intact. Psych: Normal affect.  Accessory Clinical Findings    Recent Labs: 02/26/2021: B Natriuretic Peptide 263.1 02/27/2021: ALT 16; BUN 18; Creatinine, Ser 1.17; Hemoglobin 11.4; Platelets 195; Potassium 3.6; Sodium 142   Recent Lipid Panel    Component Value Date/Time   CHOL 141 10/13/2019 0817   TRIG 128 10/13/2019 0817   HDL 56 10/13/2019 0817   CHOLHDL 2.5 10/13/2019 0817   LDLCALC 63 10/13/2019 0817    ECG personally reviewed by me today-none today.  Echocardiogram 02/27/2021 IMPRESSIONS     1. Left ventricular ejection fraction, by estimation, is 55 to 60%. The  left ventricle has normal function. The left ventricle has no regional  wall motion abnormalities. There is moderate left ventricular hypertrophy.  Left ventricular diastolic  parameters were normal.   2. Right ventricular systolic function is normal. The right ventricular  size is normal. There is mildly elevated pulmonary artery systolic  pressure. The estimated right ventricular systolic pressure is 123456 mmHg.   3. The mitral valve is normal in structure. Mild mitral valve  regurgitation.   4. The aortic valve is tricuspid. There is moderate calcification of the  aortic valve. Aortic valve regurgitation is mild. Mild to moderate aortic  valve stenosis. Vmax 2.6 m/s, MG 15 mmHg, AVA 1.0 cm^2, DI 0.36   5. Aortic dilatation noted. There is mild dilatation of the ascending  aorta, measuring 38 mm.   6. The inferior vena cava is dilated in size with >50% respiratory  variability, suggesting right atrial pressure of 8 mmHg.   FINDINGS   Left Ventricle: Left ventricular ejection fraction, by estimation, is 55  to 60%. The left ventricle has normal function. The left  ventricle has no  regional wall motion abnormalities. The left ventricular internal cavity  size was normal in size. There is   moderate left ventricular hypertrophy. Left ventricular diastolic  parameters were normal.   Right Ventricle: The right ventricular size is normal. No increase in  right ventricular wall thickness. Right ventricular systolic function is  normal. There is mildly elevated pulmonary artery systolic pressure. The  tricuspid regurgitant velocity is 2.69   m/s, and with an assumed right atrial pressure of 8 mmHg, the estimated  right ventricular systolic pressure is 123456 mmHg.   Left Atrium: Left atrial size was normal in size.   Right Atrium: Right atrial size was normal in size.   Pericardium: There is no evidence of pericardial effusion.   Mitral Valve: The mitral valve is normal in structure. Mild mitral valve  regurgitation.   Tricuspid Valve: The tricuspid valve is normal in structure. Tricuspid  valve regurgitation is trivial.   Aortic Valve: The aortic valve is tricuspid. There is moderate  calcification of the aortic valve. Aortic valve regurgitation is mild.  Aortic regurgitation PHT measures 455 msec. Mild to moderate aortic  stenosis is present. Aortic valve mean gradient  measures 14.0 mmHg. Aortic valve peak gradient measures 24.0 mmHg. Aortic  valve area, by VTI measures 0.97 cm.   Pulmonic Valve: The  pulmonic valve was not well visualized. Pulmonic valve  regurgitation is not visualized.   Aorta: The aortic root is normal in size and structure and aortic  dilatation noted. There is mild dilatation of the ascending aorta,  measuring 38 mm.   Venous: The inferior vena cava is dilated in size with greater than 50%  respiratory variability, suggesting right atrial pressure of 8 mmHg.   IAS/Shunts: No atrial level shunt detected by color flow Doppler.   Assessment & Plan   1.  Atrial flutter/fibrillation-no further episodes of accelerated  or irregular heartbeats.  Heart rate today 79.  Presented to the emergency department at [facility] on 02/26/2021 and was transferred to [facility] 02/27/2021.  She received a dose of IV metoprolol and converted to sinus rhythm.  Her metoprolol succinate was uptitrated to 50 mg daily.  She reported compliance with her apixaban.  Her follow-up echocardiogram showed normal LVEF and she was discharged in stable condition. Continue metoprolol, apixaban Heart healthy low-sodium diet-salty 6 given Increase physical activity as tolerated  Daytime somnolence-reports that she previously used CPAP but has stopped using over the last few years.  Was previously a patient of Dr. Radford Pax.  She is experiencing fatigue through the day.  Her Epworth score is 7. Order sleep study  Coronary artery disease-no recent episodes of arm neck back or chest discomfort.  Status post CABG 1998. Continue simvastatin, metoprolol, ezetimibe Heart healthy low-sodium diet-salty 6 given Increase physical activity as tolerated  Peripheral arterial disease-denies lower extremity claudication.  Underwent aortic iliac bypass Continue simvastatin, apixaban Heart healthy low-sodium diet-salty 6 given Increase physical activity as tolerated  Essential hypertension-BP today 146/78.  Well-controlled on. Continue metoprolol Increase valsartan 160 Heart healthy low-sodium diet-salty 6 given Increase physical activity as tolerated Maintain blood pressure log  Hyperlipidemia-LDL 63 10/13/2019 Continue simvastatin, ezetimibe Heart healthy low-sodium high-fiber diet Increase physical activity as tolerated  Thoracic aortic aneurysm-denies recent episodes of back and chest discomfort.  Underwent CTA 2/22 which showed a dilated 4.1 cm aneurysm.  Reports good blood pressure control at home. Repeat chest CT 2/23  Disposition: Follow-up with Dr. Debara Pickett after sleep study.  Jossie Ng. Tenicia Gural NP-C    03/28/2021, 10:36 AM [facility] [address] Office [phone] Fax [phone]  Notice: This dictation was prepared with Dragon dictation along with smaller phrase technology. Any transcriptional errors that result from this process are unintentional and may not be corrected upon review.  I spent 15 minutes examining this patient, reviewing medications, and using patient centered shared decision making involving her cardiac care.  Prior to her visit I spent greater than 20 minutes reviewing her past medical history,  medications, and prior cardiac tests.

## 2021-03-28 ENCOUNTER — Ambulatory Visit: Payer: PPO | Admitting: General Practice

## 2021-03-28 ENCOUNTER — Other Ambulatory Visit: Payer: Self-pay

## 2021-03-28 ENCOUNTER — Encounter: Payer: Self-pay | Admitting: General Practice

## 2021-03-28 VITALS — BP 146/78 | HR 79 | Ht 64.0 in | Wt 184.4 lb

## 2021-03-28 DIAGNOSIS — R400 Somnolence: Secondary | ICD-10-CM

## 2021-03-28 DIAGNOSIS — I10 Essential (primary) hypertension: Secondary | ICD-10-CM

## 2021-03-28 DIAGNOSIS — I739 Peripheral vascular disease, unspecified: Secondary | ICD-10-CM

## 2021-03-28 DIAGNOSIS — Z951 Presence of aortocoronary bypass graft: Secondary | ICD-10-CM

## 2021-03-28 DIAGNOSIS — Z79899 Other long term (current) drug therapy: Secondary | ICD-10-CM

## 2021-03-28 DIAGNOSIS — I712 Thoracic aortic aneurysm, without rupture, unspecified: Secondary | ICD-10-CM

## 2021-03-28 DIAGNOSIS — E782 Mixed hyperlipidemia: Secondary | ICD-10-CM | POA: Diagnosis not present

## 2021-03-28 DIAGNOSIS — I4891 Unspecified atrial fibrillation: Secondary | ICD-10-CM

## 2021-03-28 MED ORDER — VALSARTAN 160 MG PO TABS: 160.0000 mg | ORAL_TABLET | Freq: Every day | ORAL | 6 refills | Status: DC

## 2021-03-28 NOTE — Patient Instructions (Signed)
Medication Instructions:  INCREASE VALSARTAN '160MG'$  DAILY  *If you need a refill on your cardiac medications before your next appointment, please call your pharmacy*  Lab Work: BMET IN 1 WEEK-THIS IS NOT FASTING  If you have labs (blood work) drawn today and your tests are completely normal, you will receive your results only by:  [place] (if you have MyChart) OR A paper copy in the mail.  If you have any lab test that is abnormal or we need to change your treatment, we will call you to review the results. You may go to any [facility] that is convenient for you however, we do have a lab in our office that is able to assist you. You DO NOT need an appointment for our lab. The lab is open 8:00am and closes at 4:00pm. Lunch 12:45 - 1:45pm.  Testing/Procedures: Your physician has recommended that you have a sleep study. This test records several body functions during sleep, including: brain activity, eye movement, oxygen and carbon dioxide blood levels, heart rate and rhythm, breathing rate and rhythm, the flow of air through your mouth and nose, snoring, body muscle movements, and chest and belly movement.  Special Instructions TAKE AND LOG YOUR BLOOD PRESSURE  PLEASE READ AND FOLLOW SALTY 6-ATTACHED-1,800 mg daily  PLEASE INCREASE PHYSICAL ACTIVITY AS TOLERATED  Follow-Up: Your next appointment:  AFTER SLEEP STUDY  In Person with K. Mali Hilty, MD OR IF UNAVAILABLE JESSE CLEAVER, FNP-C   At [facility], you and your health needs are our priority.  As part of our continuing mission to provide you with exceptional heart care, we have created designated Provider Care Teams.  These Care Teams include your primary Cardiologist (physician) and Advanced Practice Providers (APPs -  Physician Assistants and Nurse Practitioners) who all work together to provide you with the care you need, when you need it.

## 2021-04-01 DIAGNOSIS — I4891 Unspecified atrial fibrillation: Secondary | ICD-10-CM | POA: Diagnosis not present

## 2021-04-01 DIAGNOSIS — G8929 Other chronic pain: Secondary | ICD-10-CM | POA: Diagnosis not present

## 2021-04-01 DIAGNOSIS — F339 Major depressive disorder, recurrent, unspecified: Secondary | ICD-10-CM | POA: Diagnosis not present

## 2021-04-01 DIAGNOSIS — E7800 Pure hypercholesterolemia, unspecified: Secondary | ICD-10-CM | POA: Diagnosis not present

## 2021-04-01 DIAGNOSIS — I2510 Atherosclerotic heart disease of native coronary artery without angina pectoris: Secondary | ICD-10-CM | POA: Diagnosis not present

## 2021-04-01 DIAGNOSIS — I10 Essential (primary) hypertension: Secondary | ICD-10-CM | POA: Diagnosis not present

## 2021-04-01 DIAGNOSIS — M1990 Unspecified osteoarthritis, unspecified site: Secondary | ICD-10-CM | POA: Diagnosis not present

## 2021-04-02 ENCOUNTER — Telehealth: Payer: Self-pay | Admitting: *Deleted

## 2021-04-02 NOTE — Telephone Encounter (Signed)
Prior Authorization for split night sent to [facility] via Fax [phone].

## 2021-04-02 NOTE — Telephone Encounter (Signed)
-----   Message from Carron Curie sent at 03/28/2021 10:55 AM EDT ----- Sleep study ordered by Coletta Memos

## 2021-04-03 DIAGNOSIS — N904 Leukoplakia of vulva: Secondary | ICD-10-CM | POA: Diagnosis not present

## 2021-04-03 DIAGNOSIS — N814 Uterovaginal prolapse, unspecified: Secondary | ICD-10-CM | POA: Diagnosis not present

## 2021-04-11 DIAGNOSIS — I712 Thoracic aortic aneurysm, without rupture: Secondary | ICD-10-CM | POA: Diagnosis not present

## 2021-04-11 DIAGNOSIS — Z79899 Other long term (current) drug therapy: Secondary | ICD-10-CM | POA: Diagnosis not present

## 2021-04-11 DIAGNOSIS — Z951 Presence of aortocoronary bypass graft: Secondary | ICD-10-CM | POA: Diagnosis not present

## 2021-04-11 DIAGNOSIS — R400 Somnolence: Secondary | ICD-10-CM | POA: Diagnosis not present

## 2021-04-11 DIAGNOSIS — I4891 Unspecified atrial fibrillation: Secondary | ICD-10-CM | POA: Diagnosis not present

## 2021-04-11 DIAGNOSIS — I739 Peripheral vascular disease, unspecified: Secondary | ICD-10-CM | POA: Diagnosis not present

## 2021-04-11 DIAGNOSIS — E782 Mixed hyperlipidemia: Secondary | ICD-10-CM | POA: Diagnosis not present

## 2021-04-11 DIAGNOSIS — I10 Essential (primary) hypertension: Secondary | ICD-10-CM | POA: Diagnosis not present

## 2021-04-12 ENCOUNTER — Other Ambulatory Visit: Payer: Self-pay

## 2021-04-12 LAB — BASIC METABOLIC PANEL
BUN/Creatinine Ratio: 14 (ref 12–28)
BUN: 20 mg/dL (ref 8–27)
CO2: 28 mmol/L (ref 20–29)
Calcium: 9.6 mg/dL (ref 8.7–10.3)
Chloride: 99 mmol/L (ref 96–106)
Creatinine, Ser: 1.45 mg/dL — ABNORMAL HIGH (ref 0.57–1.00)
Glucose: 111 mg/dL — ABNORMAL HIGH (ref 65–99)
Potassium: 4.7 mmol/L (ref 3.5–5.2)
Sodium: 142 mmol/L (ref 134–144)
eGFR: 36 mL/min/{1.73_m2} — ABNORMAL LOW (ref >59)

## 2021-04-12 MED ORDER — AMLODIPINE BESYLATE 5 MG PO TABS: 5.0000 mg | ORAL_TABLET | Freq: Every day | ORAL | 6 refills | Status: DC

## 2021-04-12 MED ORDER — VALSARTAN 80 MG PO TABS
80.0000 mg | ORAL_TABLET | Freq: Every day | ORAL | 6 refills | Status: DC
Start: 2021-04-12 — End: 2021-09-30

## 2021-04-12 NOTE — Telephone Encounter (Signed)
Ok to schedule SS valid dates are 04/12/21/ 07/11/21 aut [id number]

## 2021-04-12 NOTE — Telephone Encounter (Signed)
Sleep study order has been placed. Will send to Sleep Coordinator for precert.

## 2021-04-23 ENCOUNTER — Encounter: Payer: Self-pay | Admitting: Registered Nurse

## 2021-04-23 ENCOUNTER — Other Ambulatory Visit: Payer: Self-pay

## 2021-04-23 ENCOUNTER — Encounter: Payer: PPO | Attending: Physical Medicine and Rehabilitation | Admitting: Registered Nurse

## 2021-04-23 VITALS — BP 153/85 | HR 77 | Temp 98.0°F | Ht 64.0 in | Wt 184.6 lb

## 2021-04-23 DIAGNOSIS — M797 Fibromyalgia: Secondary | ICD-10-CM | POA: Diagnosis not present

## 2021-04-23 DIAGNOSIS — Z79891 Long term (current) use of opiate analgesic: Secondary | ICD-10-CM | POA: Insufficient documentation

## 2021-04-23 DIAGNOSIS — G894 Chronic pain syndrome: Secondary | ICD-10-CM | POA: Insufficient documentation

## 2021-04-23 DIAGNOSIS — M25561 Pain in right knee: Secondary | ICD-10-CM | POA: Insufficient documentation

## 2021-04-23 DIAGNOSIS — Z5181 Encounter for therapeutic drug level monitoring: Secondary | ICD-10-CM | POA: Diagnosis not present

## 2021-04-23 DIAGNOSIS — M25562 Pain in left knee: Secondary | ICD-10-CM | POA: Diagnosis not present

## 2021-04-23 DIAGNOSIS — G8929 Other chronic pain: Secondary | ICD-10-CM | POA: Insufficient documentation

## 2021-04-23 DIAGNOSIS — M545 Low back pain, unspecified: Secondary | ICD-10-CM | POA: Insufficient documentation

## 2021-04-23 DIAGNOSIS — M7062 Trochanteric bursitis, left hip: Secondary | ICD-10-CM | POA: Diagnosis not present

## 2021-04-23 DIAGNOSIS — M7061 Trochanteric bursitis, right hip: Secondary | ICD-10-CM | POA: Diagnosis not present

## 2021-04-23 MED ORDER — HYDROCODONE-ACETAMINOPHEN 5-325 MG PO TABS: 1.0000 | ORAL_TABLET | Freq: Four times a day (QID) | ORAL | 0 refills | Status: DC | PRN

## 2021-04-23 MED ORDER — METHOCARBAMOL 500 MG PO TABS: 500.0000 mg | ORAL_TABLET | Freq: Three times a day (TID) | ORAL | 1 refills | Status: DC | PRN

## 2021-04-23 NOTE — Telephone Encounter (Signed)
Patient is scheduled for lab study on 06/02/21. Patient understands the sleep study will be done at [facility] sleep lab. Patient understands she will receive a sleep packet in a week or so. Patient understands to call if she does not receive the sleep packet in a timely manner.   Left detailed message on voicemail with date and time of SS and informed patient to call back to confirm or reschedule @ [phone] OR [phone].

## 2021-04-23 NOTE — Progress Notes (Signed)
Subjective:    Patient ID: Kara Bush, female    DOB: 28-Sep-1940, 80 y.o.   MRN: [id number]  HPI: Kara Bush is a 80 y.o. female who returns for follow up appointment for chronic pain and medication refill. She states her pain is located in her mid- back mainly right side, lower back pain and bilateral knee pain. She  rates her pain 4. Her current exercise regime is walking.  [name] was admitted to Atrial Fibrillation with RVR, discharge summary was reviewed.   [name] Morphine equivalent is 23.33 MME.   Last UDS was Performed on 02/25/2021, it was consistent.     Pain Inventory Average Pain 4 Pain Right Now 4 My pain is constant and aching  In the last 24 hours, has pain interfered with the following? General activity 2 Relation with others 2 Enjoyment of life 2 What TIME of day is your pain at its worst? morning  Sleep (in general) Good  Pain is worse with: bending and some activites Pain improves with: medication Relief from Meds: 5  Family History  Problem Relation Age of Onset   Heart disease Father    Heart attack Father        @ 90   Cancer Paternal Aunt        BREAST   Heart failure Mother        at 17   Healthy Sister    Cancer Brother        pancreatic    Heart failure Maternal Grandmother    Heart attack Maternal Grandfather    Heart attack Paternal Grandmother        at 15   Healthy Daughter    Healthy Daughter    Social History   Socioeconomic History   Marital status: Divorced    Spouse name: Not on file   Number of children: 2   Years of education: college    Highest education level: Not on file  Occupational History    Employer: RETIRED  Tobacco Use   Smoking status: Former    Packs/day: 2.50    Years: 20.00    Pack years: 50.00    Types: Cigarettes    Quit date: 06/29/1990    Years since quitting: 30.8   Smokeless tobacco: Never  Vaping Use   Vaping Use: Never used  Substance and Sexual Activity   Alcohol  use: No   Drug use: No   Sexual activity: Not on file  Other Topics Concern   Not on file  Social History Narrative   Not on file   Social Determinants of Health   Financial Resource Strain: Not on file  Food Insecurity: Not on file  Transportation Needs: Not on file  Physical Activity: Not on file  Stress: Not on file  Social Connections: Not on file   Past Surgical History:  Procedure Laterality Date   [place]   CATARACT EXTRACTION Left 09/2019   [place]  2006   to LAD, ATRETIC LIMA AT THAT TIME ALSO   CORONARY ANGIOPLASTY WITH STENT PLACEMENT  2007   STENT TO PROX VG-DIAG, OTHER STENTS PATENT   CORONARY ARTERY BYPASS GRAFT  1998   LIMA-LAD;VG-DIAG & RCA   CORONARY STENT PLACEMENT  2005   TO LAD & LCX-STENTS,   HERNIA REPAIR     TUBAL LIGATION     BTL   Past Surgical History:  Procedure  Laterality Date   [place]   CATARACT EXTRACTION Left 09/2019   CHOLECYSTECTOMY  1997   CORONARY ANGIOPLASTY WITH STENT PLACEMENT  2006   to LAD, ATRETIC LIMA AT THAT TIME ALSO   CORONARY ANGIOPLASTY WITH STENT PLACEMENT  2007   STENT TO PROX VG-DIAG, OTHER STENTS PATENT   CORONARY ARTERY BYPASS GRAFT  1998   LIMA-LAD;VG-DIAG & RCA   CORONARY STENT PLACEMENT  2005   TO LAD & LCX-STENTS,   HERNIA REPAIR     TUBAL LIGATION     BTL   Past Medical History:  Diagnosis Date   Abdominal aortic aneurysm (HCC)    Anemia, improved 01/11/2013   Basal cell carcinoma    CAD (coronary artery disease)    hx CABG 1998, last cath 2007 with PCI and stenting to the proximal segment of the vein graft to the diagonal branch. DES Taxus was placed   Cervicalgia    Chest wall pain    Chronic fatigue fibromyalgia syndrome    Chronic pain syndrome    Coronary artery disease    Depression    Fibromyalgia    Fibromyalgia    History of nuclear stress test 08/07/2011   lexiscan; no  evidence of ischemia or infarct; low risk    Hyperlipidemia    Hypertension    Hypertension    Obstructive sleep apnea    OSA (obstructive sleep apnea)    PAD (peripheral artery disease) (HCC)    Pes anserine bursitis    Pulmonary embolism ([place]) 01/11/2013   S/P resection of aortic aneurysm    Trochanteric bursitis    Ulcerative colitis (HCC)    BP (!) 153/85   Pulse 77   Temp 98 F (36.7 C)   Ht 5\' 4"  (1.626 m)   Wt 184 lb 9.6 oz (83.7 kg)   SpO2 94%   BMI 31.69 kg/m   Opioid Risk Score:   Fall Risk Score:  `1  Depression screen PHQ 2/9  Depression screen [facility] 2/9 04/23/2021 06/04/2020 02/02/2020 08/11/2019 04/13/2018 02/12/2018 12/11/2017  Decreased Interest 0 0 0 0 0 0 0  Down, Depressed, Hopeless 0 0 0 0 0 0 0  PHQ - 2 Score 0 0 0 0 0 0 0  Tired, decreased energy - - - - - - -  Change in appetite - - - - - - -  Feeling bad or failure about yourself  - - - - - - -  Trouble concentrating - - - - - - -  Moving slowly or fidgety/restless - - - - - - -  Suicidal thoughts - - - - - - -     Review of Systems  Constitutional: Negative.   HENT: Negative.    Eyes: Negative.   Respiratory: Negative.    Cardiovascular: Negative.   Gastrointestinal: Negative.   Endocrine: Negative.   Genitourinary: Negative.   Musculoskeletal:  Positive for back pain.       Hip and knee  Skin: Negative.   Allergic/Immunologic: Negative.   Neurological: Negative.   Hematological: Negative.   Psychiatric/Behavioral: Negative.    All other systems reviewed and are negative.     Objective:   Physical Exam Vitals and nursing note reviewed.  Constitutional:      Appearance: Normal appearance.  Cardiovascular:     Rate and Rhythm: Normal rate and regular rhythm.     Pulses: Normal pulses.     Heart sounds: Normal heart sounds.  Pulmonary:  Effort: Pulmonary effort is normal.     Breath sounds: Normal breath sounds.  Musculoskeletal:     Cervical back: Normal range of motion and neck  supple.     Comments: Normal Muscle Bulk and Muscle Testing Reveals:  Upper Extremities: Full ROM and Muscle Strength 5/5  Thoracic Paraspinal Tenderness: T-7-T-9 Mainly Right Side Lumbar Paraspinal Tenderness: L-3-L-5 Bilateral Greater Trochanter Tenderness Lower Extremities: Full ROM and Muscle Strength 5/5 Arises from chair slowly Narrow Based  Gait     Skin:    General: Skin is warm and dry.  Neurological:     Mental Status: She is alert and oriented to person, place, and time.  Psychiatric:        Mood and Affect: Mood normal.        Behavior: Behavior normal.         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 04/23/2021 2. Fibromyalgia: Continue  Home Exercise Program. Continue to Monitor.04/28/2021 3. Bilateral intermittent hand numbness: Carpal tunnel syndrome versus C6 radiculopathy mild: No complaints today. Continue to Monitor. 04/23/2021 4. Chronic Midline Low Back Pain/ LBP: Refilled: Norco 5/325mg  # 140 pills--use one pill every 6 hours as needed for pain. A second prescription was e-scribe for the following month. 04/23/2021. We will continue the opioid monitoring program, this consists of regular clinic visits, examinations, urine drug screen, pill counts as well as use of [place] Controlled Substance Reporting system. A 12 month History has been reviewed on the [place] Controlled Substance Reporting System on 04/23/2021.. 5. Muscle Spasm: Continue current medication regimen with  Robaxin. 04/23/2021 6.Bilateral  Greater Trochanteric Bursitis:Continue to Alternate with heat and ice therapy. Continue current medication regime. 04/23/2021  7. Left Foot pain: No complaints today.Continue with HEP as Tolerated. Continue to Monitor. 04/23/2021 8. Right Lateral Epicondylitis Pain: No complaints Today. Continue HEP as Tolerated. Continue to  Monitor. 04/23/2021 9. Right Shoulder Tendonitis:  No complaints today. Continue Current Medication Regimen. Continue to Alternate Ice and Heat Therapy. 04/23/2021  10. Bilateral Chronic Knee Pain: Continue HEP as Tolerated. Continue to Monitor. 04/23/2021   F/U in 2 months

## 2021-04-29 DIAGNOSIS — E7800 Pure hypercholesterolemia, unspecified: Secondary | ICD-10-CM | POA: Diagnosis not present

## 2021-04-29 DIAGNOSIS — M1990 Unspecified osteoarthritis, unspecified site: Secondary | ICD-10-CM | POA: Diagnosis not present

## 2021-04-29 DIAGNOSIS — I4891 Unspecified atrial fibrillation: Secondary | ICD-10-CM | POA: Diagnosis not present

## 2021-04-29 DIAGNOSIS — I2510 Atherosclerotic heart disease of native coronary artery without angina pectoris: Secondary | ICD-10-CM | POA: Diagnosis not present

## 2021-04-29 DIAGNOSIS — I10 Essential (primary) hypertension: Secondary | ICD-10-CM | POA: Diagnosis not present

## 2021-04-29 DIAGNOSIS — F339 Major depressive disorder, recurrent, unspecified: Secondary | ICD-10-CM | POA: Diagnosis not present

## 2021-04-29 DIAGNOSIS — G8929 Other chronic pain: Secondary | ICD-10-CM | POA: Diagnosis not present

## 2021-05-28 DIAGNOSIS — I10 Essential (primary) hypertension: Secondary | ICD-10-CM | POA: Diagnosis not present

## 2021-05-28 DIAGNOSIS — K5190 Ulcerative colitis, unspecified, without complications: Secondary | ICD-10-CM | POA: Diagnosis not present

## 2021-05-28 DIAGNOSIS — F3341 Major depressive disorder, recurrent, in partial remission: Secondary | ICD-10-CM | POA: Diagnosis not present

## 2021-05-28 DIAGNOSIS — D6869 Other thrombophilia: Secondary | ICD-10-CM | POA: Diagnosis not present

## 2021-05-28 DIAGNOSIS — R7303 Prediabetes: Secondary | ICD-10-CM | POA: Diagnosis not present

## 2021-05-28 DIAGNOSIS — G8929 Other chronic pain: Secondary | ICD-10-CM | POA: Diagnosis not present

## 2021-05-28 DIAGNOSIS — I4891 Unspecified atrial fibrillation: Secondary | ICD-10-CM | POA: Diagnosis not present

## 2021-05-28 DIAGNOSIS — I700 Atherosclerosis of aorta: Secondary | ICD-10-CM | POA: Diagnosis not present

## 2021-05-28 DIAGNOSIS — G479 Sleep disorder, unspecified: Secondary | ICD-10-CM | POA: Diagnosis not present

## 2021-05-28 DIAGNOSIS — E785 Hyperlipidemia, unspecified: Secondary | ICD-10-CM | POA: Diagnosis not present

## 2021-05-28 DIAGNOSIS — M797 Fibromyalgia: Secondary | ICD-10-CM | POA: Diagnosis not present

## 2021-05-29 NOTE — Progress Notes (Deleted)
Office Visit Note  Patient: Kara Bush             Date of Birth: August 13, 1940           MRN: [id number]             PCP: Aretta Nip, MD Referring: Aretta Nip, MD Visit Date: 06/12/2021 Occupation: @GUAROCC @  Subjective:    History of Present Illness: Kara Bush is a 80 y.o. female with history of osteoarthritis and fibromyalgia.   Activities of Daily Living:  Patient reports morning stiffness for *** {minute/hour:19697}.   Patient {ACTIONS;DENIES/REPORTS:21021675::"Denies"} nocturnal pain.  Difficulty dressing/grooming: {ACTIONS;DENIES/REPORTS:21021675::"Denies"} Difficulty climbing stairs: {ACTIONS;DENIES/REPORTS:21021675::"Denies"} Difficulty getting out of chair: {ACTIONS;DENIES/REPORTS:21021675::"Denies"} Difficulty using hands for taps, buttons, cutlery, and/or writing: {ACTIONS;DENIES/REPORTS:21021675::"Denies"}  No Rheumatology ROS completed.   PMFS History:  Patient Active Problem List   Diagnosis Date Noted  . A-fib ([place]) 02/26/2021  . Atrial fibrillation ([place]) 09/09/2018  . DOE (dyspnea on exertion) 08/10/2017  . 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 Greater trochanteric bursitis of right hip 11/07/2014  . Sacro-iliac pain 11/07/2014  . Chronic midline low back pain without sciatica 11/07/2013  . Hepatitis 04/20/2013  . Screening for STD (sexually transmitted disease) 04/20/2013  . Recurrent UTI 04/20/2013  . Abdominal aortic aneurysm   . Ulcerative colitis ([place])   . Obstructive sleep apnea   . Fibromyalgia   . Coronary artery disease   . Basal cell carcinoma   . Anemia, improved 01/11/2013  . Ascending aortic aneurysm, 4.1 cm 01/11/2013  . 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 Chronic periscapular pain 10/15/2011  . Chest wall pain   . S/P resection of aortic aneurysm, 1998 prior to CABG   . PAD (peripheral artery disease), aortic-iliac bypass 1991, mild carotid bruits   . OSA (obstructive sleep apnea)    . Chronic fatigue fibromyalgia syndrome   . Hyperlipidemia   . Ulcerative colitis   . Depression   . CAD (coronary artery disease), CABG 1998, STENTS MULTIPLE SITES SINCE.    Marland Kitchen Hypertension     Past Medical History:  Diagnosis Date  . Abdominal aortic aneurysm ([place])   . Anemia, improved 01/11/2013  . Basal cell carcinoma   . CAD (coronary artery disease)    hx CABG 1998, last cath 2007 with PCI and stenting to the proximal segment of the vein graft to the diagonal branch. DES Taxus was placed  . Cervicalgia   . Chest wall pain   . Chronic fatigue fibromyalgia syndrome   . Chronic pain syndrome   . Coronary artery disease   . Depression   . Fibromyalgia   . Fibromyalgia   . History of nuclear stress test 08/07/2011   lexiscan; no evidence of ischemia or infarct; low risk   . Hyperlipidemia   . Hypertension   . Hypertension   . Obstructive sleep apnea   . OSA (obstructive sleep apnea)   . PAD (peripheral artery disease) ([place])   . Pes anserine bursitis   . Pulmonary embolism ([place]) 01/11/2013  . S/P resection of aortic aneurysm   . Trochanteric bursitis   . Ulcerative colitis ([place])     Family History  Problem Relation Age of Onset  . Heart disease Father   . Heart attack Father        @ 58  . Cancer Paternal Aunt        BREAST  . Heart failure Mother        at 49  .  Healthy Sister   . Cancer Brother        pancreatic   . Heart failure Maternal Grandmother   . Heart attack Maternal Grandfather   . Heart attack Paternal Grandmother        at 71  . Healthy Daughter   . Healthy Daughter    Past Surgical History:  Procedure Laterality Date  . [place]  . CATARACT EXTRACTION Left 09/2019  . CHOLECYSTECTOMY  1997  . CORONARY ANGIOPLASTY WITH STENT PLACEMENT  2006   to LAD, ATRETIC LIMA AT THAT TIME ALSO  . CORONARY ANGIOPLASTY WITH STENT PLACEMENT  2007   STENT TO PROX VG-DIAG, OTHER STENTS PATENT  . CORONARY ARTERY BYPASS GRAFT  1998    LIMA-LAD;VG-DIAG & RCA  . CORONARY STENT PLACEMENT  2005   TO LAD & LCX-STENTS,  . HERNIA REPAIR    . TUBAL LIGATION     BTL   Social History   Social History Narrative  . Not on file   Immunization History  Administered Date(s) Administered  . Influenza,inj,Quad PF,6+ Mos 04/20/2013, 06/01/2014  . PFIZER(Purple Top)SARS-COV-2 Vaccination 08/18/2019, 09/11/2019, 04/27/2020     Objective: Vital Signs: There were no vitals taken for this visit.   Physical Exam Vitals and nursing note reviewed.  Constitutional:      Appearance: She is well-developed.  HENT:     Head: Normocephalic and atraumatic.  Eyes:     Conjunctiva/sclera: Conjunctivae normal.  Pulmonary:     Effort: Pulmonary effort is normal.  Abdominal:     Palpations: Abdomen is soft.  Musculoskeletal:     Cervical back: Normal range of motion.  Skin:    General: Skin is warm and dry.     Capillary Refill: Capillary refill takes less than 2 seconds.  Neurological:     Mental Status: She is alert and oriented to person, place, and time.  Psychiatric:        Behavior: Behavior normal.     Musculoskeletal Exam: ***  CDAI Exam: CDAI Score: -- Patient Global: --; Provider Global: -- Swollen: --; Tender: -- Joint Exam 06/12/2021   No joint exam has been documented for this visit   There is currently no information documented on the homunculus. Go to the Rheumatology activity and complete the homunculus joint exam.  Investigation: No additional findings.  Imaging: No results found.  Recent Labs: Lab Results  Component Value Date   WBC 8.4 02/27/2021   HGB 11.4 (L) 02/27/2021   PLT 195 02/27/2021   NA 142 04/11/2021   K 4.7 04/11/2021   CL 99 04/11/2021   CO2 28 04/11/2021   GLUCOSE 111 (H) 04/11/2021   BUN 20 04/11/2021   CREATININE 1.45 (H) 04/11/2021   BILITOT 0.6 02/27/2021   ALKPHOS 37 (L) 02/27/2021   AST 17 02/27/2021   ALT 16 02/27/2021   PROT 6.1 (L) 02/27/2021   ALBUMIN 3.7  02/27/2021   CALCIUM 9.6 04/11/2021   GFRAA 73 10/13/2019    Speciality Comments: No specialty comments available.  Procedures:  No procedures performed Allergies: Biotin, Lisinopril, Atenolol, Bactrim [sulfamethoxazole-trimethoprim], Cymbalta [duloxetine hcl], Lyrica [pregabalin], and Penicillins   Assessment / Plan:     Visit Diagnoses: No diagnosis found.  Orders: No orders of the defined types were placed in this encounter.  No orders of the defined types were placed in this encounter.   Face-to-face time spent with patient was *** minutes. Greater than 50% of time was spent in counseling and  coordination of care.  Follow-Up Instructions: No follow-ups on file.   Earnestine Mealing, CMA  Note - This record has been created using Editor, commissioning.  Chart creation errors have been sought, but may not always  have been located. Such creation errors do not reflect on  the standard of medical care.

## 2021-06-02 ENCOUNTER — Ambulatory Visit (HOSPITAL_BASED_OUTPATIENT_CLINIC_OR_DEPARTMENT_OTHER): Payer: PPO | Attending: Internal Medicine | Admitting: Cardiology

## 2021-06-02 ENCOUNTER — Other Ambulatory Visit: Payer: Self-pay

## 2021-06-02 DIAGNOSIS — G4733 Obstructive sleep apnea (adult) (pediatric): Secondary | ICD-10-CM

## 2021-06-02 DIAGNOSIS — G4736 Sleep related hypoventilation in conditions classified elsewhere: Secondary | ICD-10-CM | POA: Insufficient documentation

## 2021-06-03 NOTE — Procedures (Signed)
Patient Name: Kara Bush, Kara Bush Date: 06/02/2021 Gender: Female D.O.B: 01/19/1941 Age (years): 72 Referring Provider: Nadean Corwin Hilty Height (inches): 64 Interpreting Physician: Fransico Him MD, ABSM Weight (lbs): 184 RPSGT: Earney Hamburg BMI: 32 MRN: [id number] Neck Size: 16.00  CLINICAL INFORMATION Sleep Study Type: Split Night CPAP  Indication for sleep study: OSA  Epworth Sleepiness Score: 6  SLEEP STUDY TECHNIQUE As per the AASM Manual for the Scoring of Sleep and Associated Events v2.3 (April 2016) with a hypopnea requiring 4% desaturations.  The channels recorded and monitored were frontal, central and occipital EEG, electrooculogram (EOG), submentalis EMG (chin), nasal and oral airflow, thoracic and abdominal wall motion, anterior tibialis EMG, snore microphone, electrocardiogram, and pulse oximetry. Continuous positive airway pressure (CPAP) was initiated when the patient met split night criteria and was titrated according to treat sleep-disordered breathing.  MEDICATIONS Medications self-administered by patient taken the night of the study : ZOLPIDEM TARTRATE, eliquis, SIMVASTATIN, HYDROCODONE  RESPIRATORY PARAMETERS Diagnostic Total AHI (/hr): 63.4  RDI (/hr):77.6  OA Index (/hr): 3.9  CA Index (/hr): 0.0 REM AHI (/hr): N/A  NREM AHI (/hr):63.4  Supine AHI (/hr):77.7  Non-supine AHI (/hr):15 Min O2 Sat (%):79.0  Mean O2 (%): 91.6  Time below 88% (min):14.7   Titration Optimal Pressure (cm):11  AHI at Optimal Pressure (/hr):0  Min O2 at Optimal Pressure (%):89.0 Supine % at Optimal (%):0  Sleep % at Optimal (%):94   SLEEP ARCHITECTURE The recording time for the entire night was 393.1 minutes.  During a baseline period of 168.6 minutes, the patient slept for 123.0 minutes in REM and nonREM, yielding a sleep efficiency of 72.9%. Sleep onset after lights out was 25.9 minutes with a REM latency of N/A minutes. The patient spent 4.1% of the  night in stage N1 sleep, 95.9% in stage N2 sleep, 0.0% in stage N3 and 0% in REM.  During the titration period of 209.0 minutes, the patient slept for 153.5 minutes in REM and nonREM, yielding a sleep efficiency of 73.4%. Sleep onset after CPAP initiation was 18.4 minutes with a REM latency of N/A minutes. The patient spent 3.3% of the night in stage N1 sleep, 96.7% in stage N2 sleep, 0.0% in stage N3 and 0% in REM.  CARDIAC DATA The 2 lead EKG demonstrated sinus rhythm. The mean heart rate was 100.0 beats per minute. Other EKG findings include: None.  LEG MOVEMENT DATA The total Periodic Limb Movements of Sleep (PLMS) were 0. The PLMS index was 0.0 .  IMPRESSIONS - Severe obstructive sleep apnea occurred during the diagnostic portion of the study (AHI = 63.4/hour). An optimal PAP pressure was selected for this patient ( 11 cm of water) - No significant central sleep apnea occurred during the diagnostic portion of the study (CAI = 0.0/hour). - Mild oxygen desaturation was noted during the diagnostic portion of the study (Min O2 = 79.0%). - The patient snored with moderate snoring volume during the diagnostic portion of the study. - No cardiac abnormalities were noted during this study. - Clinically significant periodic limb movements did not occur during sleep.  DIAGNOSIS - Obstructive Sleep Apnea (G47.33) - Nocturnal Hypoxemia  RECOMMENDATIONS - Trial of CPAP therapy on 11 cm H2O with a Small size Fisher&Paykel Full Face Mask Simplus mask and heated humidification. - Avoid alcohol, sedatives and other CNS depressants that may worsen sleep apnea and disrupt normal sleep architecture. - Sleep hygiene should be reviewed to assess factors that may improve sleep quality. - Weight management and  regular exercise should be initiated or continued. - Return to Sleep Center for re-evaluation after 4 weeks of therapy  [Electronically signed] 06/03/2021 09:02 AM  Fransico Him MD, ABSM Diplomate,  American Board of Sleep Medicine

## 2021-06-11 ENCOUNTER — Telehealth: Payer: Self-pay | Admitting: Internal Medicine

## 2021-06-11 ENCOUNTER — Telehealth: Payer: Self-pay | Admitting: *Deleted

## 2021-06-11 NOTE — Telephone Encounter (Signed)
The patient has been notified of the result and verbalized understanding.  All questions (if any) were answered. Kara Bush, Kara Bush 06/11/2021 6:02 PM    Upon patient request DME selection is [name] Patient understands he will be contacted by [name] to set up his cpap. Patient understands to call if [name] does not contact him with new setup in a timely manner. Patient understands they will be called once confirmation has been received from [facility] that they have received their new machine to schedule 10 week follow up appointment.   [name] notified of new cpap order  Please add to airview Patient was grateful for the call and thanked me.

## 2021-06-11 NOTE — Telephone Encounter (Signed)
Spoke with pt. She report since this morning she's noticed she is in afib with a HR of 80-90's. Pt also report she is experiencing chest heaviness and feeling lightheaded. She state she just feeling like something is going on.  Based on current symptoms nurse recommended to report to ER for further evaluations. Pt state she will wait to see if symptoms get better but nurse reiterated the importance of of being seen due to chest tightness.

## 2021-06-11 NOTE — Telephone Encounter (Signed)
-----   Message from Sueanne Margarita, MD sent at 06/03/2021  9:06 AM EDT ----- Please let patient know that they have sleep apnea with successful PAP titration.  PAP ordered placed in Epic.  Followup in 6 weeks after starting PAP therapy

## 2021-06-11 NOTE — Telephone Encounter (Signed)
Patient c/o Palpitations:  High priority if patient c/o lightheadedness, shortness of breath, or chest pain  How long have you had palpitations/irregular HR/ Afib? Are you having the symptoms now? Afib at this time  Are you currently experiencing lightheadedness, SOB or CP? A little short of breath  Do you have a history of afib (atrial fibrillation) or irregular heart rhythm? yes  Have you checked your BP or HR? (document readings if available):  have not taken it  Are you experiencing any other symptoms?  Feels tired

## 2021-06-12 ENCOUNTER — Ambulatory Visit: Payer: PPO | Admitting: Physician Assistant

## 2021-06-13 NOTE — Progress Notes (Signed)
Office Visit Note  Patient: Kara Bush             Date of Birth: 28-Jan-1941           MRN: [id number]             PCP: Aretta Nip, MD Referring: Aretta Nip, MD Visit Date: 06/19/2021 Occupation: @GUAROCC @  Subjective:  Fatigue  History of Present Illness: Kara Bush is a 80 y.o. female with history of osteoarthritis and fibromyalgia.  Patient reports that she has been experiencing paroxysmal atrial fibrillation which has been causing increased fatigue.  She has been started on several new medications after presenting to the ED in July 2022.  She had an updated echocardiogram on 02/27/2021.  She remains on Eliquis as prescribed.  She has noticed some increased fluid retention in both ankles since starting on amlodipine 5 mg daily.  She is an upcoming appointment with her cardiologist.  She reports that she recently had a sleep study on 06/05/2021 and will be getting a CPAP which she hopes will help with her fatigue.  She has difficulty walking prolonged distances due to trochanteric bursitis of both hips.  She continues to experience intermittent myalgias and muscle tenderness due to fibromyalgia.  Overall her symptoms have been tolerable.  She takes hydrocodone or acetaminophen as needed for pain relief as well as methocarbamol for muscle spasms.    Activities of Daily Living:  Patient reports morning stiffness for 2 hours.   Patient Reports nocturnal pain.  Difficulty dressing/grooming: Reports Difficulty climbing stairs: Reports Difficulty getting out of chair: Reports Difficulty using hands for taps, buttons, cutlery, and/or writing: Denies  Review of Systems  Constitutional:  Positive for fatigue.  HENT:  Positive for mouth dryness. Negative for mouth sores and nose dryness.   Eyes:  Positive for dryness. Negative for pain and itching.  Respiratory:  Negative for shortness of breath and difficulty breathing.   Cardiovascular:  Positive for  palpitations. Negative for chest pain.  Gastrointestinal:  Negative for blood in stool, constipation and diarrhea.  Endocrine: Negative for increased urination.  Genitourinary:  Negative for difficulty urinating.  Musculoskeletal:  Positive for joint pain, joint pain, myalgias, morning stiffness, muscle tenderness and myalgias. Negative for joint swelling.  Skin:  Positive for color change. Negative for rash and redness.  Allergic/Immunologic: Negative for susceptible to infections.  Neurological:  Negative for dizziness, numbness, headaches, memory loss and weakness.  Hematological:  Negative for bruising/bleeding tendency.  Psychiatric/Behavioral:  Negative for confusion.    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   Greater trochanteric bursitis of right hip 11/07/2014   Sacro-iliac pain 11/07/2014   Chronic midline low back pain without sciatica 11/07/2013   Hepatitis 04/20/2013   Screening for STD (sexually transmitted disease) 04/20/2013   Recurrent UTI 04/20/2013   Abdominal aortic aneurysm    Ulcerative colitis ([place])    Obstructive sleep apnea    Fibromyalgia    Coronary artery disease    Basal cell carcinoma    Anemia, improved 01/11/2013   Ascending aortic aneurysm, 4.1 cm 01/11/2013   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     Hypertension     Past Medical History:  Diagnosis Date   Abdominal  aortic aneurysm    Anemia, improved 01/11/2013   Basal cell carcinoma    CAD (coronary artery disease)    hx CABG 1998, last cath 2007 with PCI and stenting to the proximal segment of the vein graft to the diagonal branch. DES Taxus was placed   Cervicalgia    Chest wall pain    Chronic fatigue fibromyalgia syndrome    Chronic pain syndrome    Coronary artery disease    Depression    Fibromyalgia    Fibromyalgia    History of nuclear stress test 08/07/2011   lexiscan; no evidence of ischemia or infarct; low risk    Hyperlipidemia    Hypertension    Hypertension    Obstructive sleep apnea    OSA (obstructive sleep apnea)    PAD (peripheral artery disease) (HCC)    Pes anserine bursitis    Pulmonary embolism ([place]) 01/11/2013   S/P resection of aortic aneurysm    Trochanteric bursitis    Ulcerative colitis ([place])     Family History  Problem Relation Age of Onset   Heart disease Father    Heart attack Father        @ 46   Cancer Paternal Aunt        BREAST   Heart failure Mother        at 41   Healthy Sister    Cancer Brother        pancreatic    Heart failure Maternal Grandmother    Heart attack Maternal Grandfather    Heart attack Paternal Grandmother        at 45   Healthy Daughter    Healthy Daughter    Past Surgical History:  Procedure Laterality Date   AORTOBIEXTERNAL ILIAC BYPASS  1991   CATARACT EXTRACTION Left 09/2019   CHOLECYSTECTOMY  1997   CORONARY ANGIOPLASTY WITH STENT PLACEMENT  2006   to LAD, ATRETIC [place] AT THAT TIME ALSO   CORONARY ANGIOPLASTY WITH STENT PLACEMENT  2007   STENT TO PROX VG-DIAG, OTHER STENTS PATENT   CORONARY ARTERY BYPASS GRAFT  1998   LIMA-LAD;VG-DIAG & RCA   CORONARY STENT PLACEMENT  2005   TO LAD & LCX-STENTS,   HERNIA REPAIR     TUBAL LIGATION     BTL   Social History   Social History Narrative   Not on file   Immunization History  Administered Date(s) Administered   Influenza,inj,Quad PF,6+ Mos 04/20/2013,  06/01/2014   PFIZER(Purple Top)SARS-COV-2 Vaccination 08/18/2019, 09/11/2019, 04/27/2020     Objective: Vital Signs: BP (!) 144/75 (BP Location: Left Arm, Patient Position: Sitting, Cuff Size: Normal)   Pulse 69   Ht 5\' 4"  (1.626 m)   Wt 188 lb 3.2 oz (85.4 kg)   BMI 32.30 kg/m    Physical Exam Vitals and nursing note reviewed.  Constitutional:      Appearance: She is well-developed.  HENT:     Head: Normocephalic and atraumatic.  Eyes:     Conjunctiva/sclera: Conjunctivae normal.  Pulmonary:     Effort: Pulmonary effort is normal.  Abdominal:     Palpations: Abdomen  is soft.  Musculoskeletal:     Cervical back: Normal range of motion.  Skin:    General: Skin is warm and dry.     Capillary Refill: Capillary refill takes less than 2 seconds.  Neurological:     Mental Status: She is alert and oriented to person, place, and time.  Psychiatric:        Behavior: Behavior normal.     Musculoskeletal Exam: C-spine has slightly limited ROM with lateral rotation. No midline spinal tenderness in the lumbar region noted.  Shoulder joints, elbow joints, wrist joints, MCPs, PIPs, DIPs have good range of motion with no synovitis.  Complete fist formation bilaterally.  PIP and DIP thickening consistent with osteoarthritis of both hands.  CMC joint prominence noted bilaterally.  Hip joints have good range of motion with no groin pain.  Tenderness over bilateral trochanteric bursa and IT bands.  Knee joints have good range of motion with no discomfort.  No warmth or effusion noted.  Ankle joints have good range of motion with no tenderness or joint swelling. Pedal edema noted bilaterally.   CDAI Exam: CDAI Score: -- Patient Global: --; Provider Global: -- Swollen: --; Tender: -- Joint Exam 06/19/2021   No joint exam has been documented for this visit   There is currently no information documented on the homunculus. Go to the Rheumatology activity and complete the homunculus joint  exam.  Investigation: No additional findings.  Imaging: SLEEP STUDY DOCUMENTS  Result Date: 06/05/2021 Ordered by an unspecified provider.   Recent Labs: Lab Results  Component Value Date   WBC 8.4 02/27/2021   HGB 11.4 (L) 02/27/2021   PLT 195 02/27/2021   NA 142 04/11/2021   K 4.7 04/11/2021   CL 99 04/11/2021   CO2 28 04/11/2021   GLUCOSE 111 (H) 04/11/2021   BUN 20 04/11/2021   CREATININE 1.45 (H) 04/11/2021   BILITOT 0.6 02/27/2021   ALKPHOS 37 (L) 02/27/2021   AST 17 02/27/2021   ALT 16 02/27/2021   PROT 6.1 (L) 02/27/2021   ALBUMIN 3.7 02/27/2021   CALCIUM 9.6 04/11/2021   GFRAA 73 10/13/2019    Speciality Comments: No specialty comments available.  Procedures:  No procedures performed Allergies: Biotin, Lisinopril, Atenolol, Bactrim [sulfamethoxazole-trimethoprim], Cymbalta [duloxetine hcl], Lyrica [pregabalin], and Penicillins    Rollator walker  Assessment / Plan:     Visit Diagnoses: Chronic right shoulder pain: She has good range of motion of the right shoulder joint on examination today with no discomfort.  Lateral epicondylitis, right elbow - Resolved.  She had a cortisone injection performed on 12/13/19.  She tries to avoid performing overuse activities.  No tenderness to palpation on examination today over the lateral epicondyle of the right elbow was noted.  Primary osteoarthritis of both hands: She has PIP and DIP thickening consistent with osteoarthritis of both hands.  CMC joint prominence noted bilaterally.  No tenderness or inflammation was noted on examination today.  Discussed the importance of joint protection and muscle strengthening.  She was advised to notify us if she develops increased joint pain or joint swelling.  Trochanteric bursitis of both hips: She continues to experience intermittent discomfort due to trochanter bursitis of both hips.  She has tenderness palpation over bilateral trochanteric bursa on examination today.  Her  discomfort is typically exacerbated by standing or walking for prolonged periods of time.  On examination she has good range of motion of both hip joints with no groin pain.  Different treatment options were  discussed.  She was strongly encouraged perform stretching exercises on a daily basis.  She declined physical therapy at this time.  Her symptoms are not severe enough for a trochanteric bursa cortisone injection at this time.  Cervical spondylolysis: She has slightly limited range of motion with lateral rotation of the C-spine.  No symptoms of radiculopathy at this time.  Trapezius muscle tension noted bilaterally.  Sacro-iliac pain: She experiences intermittent discomfort in her lower back especially while walking prolonged distances.    Fibromyalgia: She experiences intermittent myalgias and muscle tenderness due to fibromyalgia.  She has positive tender points on examination today.  She continues to experience intermittent symptoms of trochanteric bursitis of both hips.  Discussed the importance of performing stretching exercises on a daily basis.  I also reviewed the importance of regular exercise and good sleep hygiene.  Stressed the importance of trying to get more restorative sleep.  She will be starting to use a CPAP which will hopefully help with her daytime drowsiness and fatigue.  Primary insomnia - She takes ambien 5 mg 1-2 tablets by mouth at bedtime for insomnia. Discussed the importance of regular exercise and good sleep hygiene.  History of depression: She is taking celexa as prescribed.  Paroxysmal atrial fibrillation ([place]): She has been experiencing paroxysmal atrial fibrillation.  She was evaluated in the ED on 02/26/2021 and was found to be in atrial fibrillation with RVR.  She has noticed increased fatigue due to the increased frequency of these episodes.  She has been following along closely with Dr. Debara Pickett and has been started on several new medications.  She has noticed some  increased pedal edema since starting on amlodipine.  She remains on Eliquis as prescribed.  She underwent a sleep study on 06/05/2021 and will be getting a new CPAP.  Other medical conditions are listed as follows:   Coronary artery disease status post CABG  History of hypertension  OSA (obstructive sleep apnea)  History of hyperlipidemia  History of ulcerative colitis  Osteoporosis screening - DEXA followed by PCP   Orders: No orders of the defined types were placed in this encounter.  No orders of the defined types were placed in this encounter.     Follow-Up Instructions: Return in about 6 months (around 12/17/2021) for Osteoarthritis, Fibromyalgia.   Ofilia Neas, PA-C  Note - This record has been created using Dragon software.  Chart creation errors have been sought, but may not always  have been located. Such creation errors do not reflect on  the standard of medical care.

## 2021-06-19 ENCOUNTER — Ambulatory Visit: Payer: PPO | Admitting: Physician Assistant

## 2021-06-19 ENCOUNTER — Other Ambulatory Visit: Payer: Self-pay

## 2021-06-19 ENCOUNTER — Encounter: Payer: Self-pay | Admitting: Physician Assistant

## 2021-06-19 VITALS — BP 144/75 | HR 69 | Ht 64.0 in | Wt 188.2 lb

## 2021-06-19 DIAGNOSIS — M4302 Spondylolysis, cervical region: Secondary | ICD-10-CM | POA: Diagnosis not present

## 2021-06-19 DIAGNOSIS — M7061 Trochanteric bursitis, right hip: Secondary | ICD-10-CM

## 2021-06-19 DIAGNOSIS — I2510 Atherosclerotic heart disease of native coronary artery without angina pectoris: Secondary | ICD-10-CM | POA: Diagnosis not present

## 2021-06-19 DIAGNOSIS — M533 Sacrococcygeal disorders, not elsewhere classified: Secondary | ICD-10-CM | POA: Diagnosis not present

## 2021-06-19 DIAGNOSIS — Z8639 Personal history of other endocrine, nutritional and metabolic disease: Secondary | ICD-10-CM

## 2021-06-19 DIAGNOSIS — Z8719 Personal history of other diseases of the digestive system: Secondary | ICD-10-CM

## 2021-06-19 DIAGNOSIS — M7711 Lateral epicondylitis, right elbow: Secondary | ICD-10-CM | POA: Diagnosis not present

## 2021-06-19 DIAGNOSIS — Z8679 Personal history of other diseases of the circulatory system: Secondary | ICD-10-CM

## 2021-06-19 DIAGNOSIS — G4733 Obstructive sleep apnea (adult) (pediatric): Secondary | ICD-10-CM

## 2021-06-19 DIAGNOSIS — Z8659 Personal history of other mental and behavioral disorders: Secondary | ICD-10-CM

## 2021-06-19 DIAGNOSIS — M25511 Pain in right shoulder: Secondary | ICD-10-CM | POA: Diagnosis not present

## 2021-06-19 DIAGNOSIS — M19041 Primary osteoarthritis, right hand: Secondary | ICD-10-CM

## 2021-06-19 DIAGNOSIS — M797 Fibromyalgia: Secondary | ICD-10-CM | POA: Diagnosis not present

## 2021-06-19 DIAGNOSIS — M7062 Trochanteric bursitis, left hip: Secondary | ICD-10-CM

## 2021-06-19 DIAGNOSIS — Z13820 Encounter for screening for osteoporosis: Secondary | ICD-10-CM

## 2021-06-19 DIAGNOSIS — I480 Paroxysmal atrial fibrillation: Secondary | ICD-10-CM | POA: Diagnosis not present

## 2021-06-19 DIAGNOSIS — M19042 Primary osteoarthritis, left hand: Secondary | ICD-10-CM

## 2021-06-19 DIAGNOSIS — G8929 Other chronic pain: Secondary | ICD-10-CM

## 2021-06-19 DIAGNOSIS — F5101 Primary insomnia: Secondary | ICD-10-CM | POA: Diagnosis not present

## 2021-06-22 DIAGNOSIS — E785 Hyperlipidemia, unspecified: Secondary | ICD-10-CM | POA: Diagnosis not present

## 2021-06-22 DIAGNOSIS — F3341 Major depressive disorder, recurrent, in partial remission: Secondary | ICD-10-CM | POA: Diagnosis not present

## 2021-06-22 DIAGNOSIS — G8929 Other chronic pain: Secondary | ICD-10-CM | POA: Diagnosis not present

## 2021-06-22 DIAGNOSIS — I2510 Atherosclerotic heart disease of native coronary artery without angina pectoris: Secondary | ICD-10-CM | POA: Diagnosis not present

## 2021-06-22 DIAGNOSIS — M1990 Unspecified osteoarthritis, unspecified site: Secondary | ICD-10-CM | POA: Diagnosis not present

## 2021-06-22 DIAGNOSIS — E7800 Pure hypercholesterolemia, unspecified: Secondary | ICD-10-CM | POA: Diagnosis not present

## 2021-06-22 DIAGNOSIS — I10 Essential (primary) hypertension: Secondary | ICD-10-CM | POA: Diagnosis not present

## 2021-06-22 DIAGNOSIS — I4891 Unspecified atrial fibrillation: Secondary | ICD-10-CM | POA: Diagnosis not present

## 2021-06-24 ENCOUNTER — Encounter: Payer: Self-pay | Admitting: Registered Nurse

## 2021-06-24 ENCOUNTER — Other Ambulatory Visit: Payer: Self-pay

## 2021-06-24 ENCOUNTER — Encounter: Payer: PPO | Attending: Physical Medicine and Rehabilitation | Admitting: Registered Nurse

## 2021-06-24 VITALS — BP 157/68 | HR 76 | Temp 98.3°F | Ht 64.0 in | Wt 189.0 lb

## 2021-06-24 DIAGNOSIS — Z5181 Encounter for therapeutic drug level monitoring: Secondary | ICD-10-CM

## 2021-06-24 DIAGNOSIS — Z79891 Long term (current) use of opiate analgesic: Secondary | ICD-10-CM

## 2021-06-24 DIAGNOSIS — G894 Chronic pain syndrome: Secondary | ICD-10-CM | POA: Diagnosis not present

## 2021-06-24 DIAGNOSIS — M797 Fibromyalgia: Secondary | ICD-10-CM | POA: Diagnosis not present

## 2021-06-24 MED ORDER — HYDROCODONE-ACETAMINOPHEN 5-325 MG PO TABS: 1.0000 | ORAL_TABLET | Freq: Four times a day (QID) | ORAL | 0 refills | Status: DC | PRN

## 2021-06-24 NOTE — Progress Notes (Signed)
Subjective:    Patient ID: Kara Bush, female    DOB: 1940/12/04, 80 y.o.   MRN: [id number]  HPI: Kara Bush is a 80 y.o. female who returns for follow up appointment for chronic pain and medication refill. She states her pain is located in her mid- back and bilateral hips. She rates her pain 1. Her current exercise regime is walking short distances  and performing stretching exercises.  Kara Bush Morphine equivalent is 22.56 MME.   UDS ordered today.      Pain Inventory Average Pain 5 Pain Right Now 1 My pain is constant and aching  In the last 24 hours, has pain interfered with the following? General activity 3 Relation with others 0 Enjoyment of life 1 What TIME of day is your pain at its worst? morning  Sleep (in general) Poor  Pain is worse with: walking, bending, and some activites Pain improves with: rest and medication Relief from Meds: 5  Family History  Problem Relation Age of Onset   Heart disease Father    Heart attack Father        @ 60   Cancer Paternal Aunt        BREAST   Heart failure Mother        at 78   Healthy Sister    Cancer Brother        pancreatic    Heart failure Maternal Grandmother    Heart attack Maternal Grandfather    Heart attack Paternal Grandmother        at 6   Healthy Daughter    Healthy Daughter    Social History   Socioeconomic History   Marital status: Divorced    Spouse name: Not on file   Number of children: 2   Years of education: college    Highest education level: Not on file  Occupational History    Employer: RETIRED  Tobacco Use   Smoking status: Former    Packs/day: 2.50    Years: 20.00    Pack years: 50.00    Types: Cigarettes    Quit date: 06/29/1990    Years since quitting: 31.0   Smokeless tobacco: Never  Vaping Use   Vaping Use: Never used  Substance and Sexual Activity   Alcohol use: No   Drug use: No   Sexual activity: Not on file  Other Topics Concern   Not on file   Social History Narrative   Not on file   Social Determinants of Health   Financial Resource Strain: Not on file  Food Insecurity: Not on file  Transportation Needs: Not on file  Physical Activity: Not on file  Stress: Not on file  Social Connections: Not on file   Past Surgical History:  Procedure Laterality Date   [place]   CATARACT EXTRACTION Left 09/2019   [place]  2006   to LAD, ATRETIC LIMA AT THAT TIME ALSO   CORONARY ANGIOPLASTY WITH STENT PLACEMENT  2007   STENT TO PROX VG-DIAG, OTHER STENTS PATENT   CORONARY ARTERY BYPASS GRAFT  1998   LIMA-LAD;VG-DIAG & RCA   CORONARY STENT PLACEMENT  2005   TO LAD & LCX-STENTS,   HERNIA REPAIR     TUBAL LIGATION     BTL   Past Surgical History:  Procedure Laterality Date   AORTOBIEXTERNAL ILIAC BYPASS  1991   CATARACT EXTRACTION Left 09/2019   CHOLECYSTECTOMY  [place] WITH STENT PLACEMENT  2006   to LAD, ATRETIC LIMA AT THAT TIME ALSO   CORONARY ANGIOPLASTY WITH STENT PLACEMENT  2007   STENT TO PROX VG-DIAG, OTHER STENTS PATENT   CORONARY ARTERY BYPASS GRAFT  1998   LIMA-LAD;VG-DIAG & RCA   CORONARY STENT PLACEMENT  2005   TO LAD & LCX-STENTS,   HERNIA REPAIR     TUBAL LIGATION     BTL   Past Medical History:  Diagnosis Date   Abdominal aortic aneurysm    Anemia, improved 01/11/2013   Basal cell carcinoma    CAD (coronary artery disease)    hx CABG 1998, last cath 2007 with PCI and stenting to the proximal segment of the vein graft to the diagonal branch. DES Taxus was placed   Cervicalgia    Chest wall pain    Chronic fatigue fibromyalgia syndrome    Chronic pain syndrome    Coronary artery disease    Depression    Fibromyalgia    Fibromyalgia    History of nuclear stress test 08/07/2011   lexiscan; no evidence of ischemia or infarct; low risk    Hyperlipidemia    Hypertension    Hypertension     Obstructive sleep apnea    OSA (obstructive sleep apnea)    PAD (peripheral artery disease) (HCC)    Pes anserine bursitis    Pulmonary embolism ([place]) 01/11/2013   S/P resection of aortic aneurysm    Trochanteric bursitis    Ulcerative colitis (HCC)    BP (!) 157/68   Pulse 76   Temp 98.3 F (36.8 C)   Ht 5\' 4"  (1.626 m)   Wt 189 lb (85.7 kg)   SpO2 94%   BMI 32.44 kg/m   Opioid Risk Score:   Fall Risk Score:  `1  Depression screen PHQ 2/9  Depression screen [facility] 2/9 04/23/2021 06/04/2020 02/02/2020 08/11/2019 04/13/2018 02/12/2018 12/11/2017  Decreased Interest 0 0 0 0 0 0 0  Down, Depressed, Hopeless 0 0 0 0 0 0 0  PHQ - 2 Score 0 0 0 0 0 0 0  Tired, decreased energy - - - - - - -  Change in appetite - - - - - - -  Feeling bad or failure about yourself  - - - - - - -  Trouble concentrating - - - - - - -  Moving slowly or fidgety/restless - - - - - - -  Suicidal thoughts - - - - - - -    Review of Systems  Respiratory:  Positive for shortness of breath.   Musculoskeletal:  Positive for back pain and gait problem.       Pain in both hips, both knees   All other systems reviewed and are negative.     Objective:   Physical Exam Vitals and nursing note reviewed.  Constitutional:      Appearance: Normal appearance.  Cardiovascular:     Rate and Rhythm: Normal rate and regular rhythm.     Pulses: Normal pulses.     Heart sounds: Normal heart sounds.  Pulmonary:     Effort: Pulmonary effort is normal.     Breath sounds: Normal breath sounds.  Musculoskeletal:     Cervical back: Normal range of motion and neck supple.     Comments: Normal Muscle Bulk and Muscle Testing Reveals:  Upper Extremities: Full ROM and Muscle Strength 5/5 Thoracic Paraspinal Tenderness: T-7-T-9 Bilateral Greater Trochanter Tenderness  Lower Extremities: Full ROM and Muscle Strength 5/5 Arises from Table Slowly Narrow Based  Gait     Skin:    General: Skin is warm and dry.  Neurological:      Mental Status: She is alert and oriented to person, place, and time.  Psychiatric:        Mood and Affect: Mood normal.        Behavior: Behavior normal.         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 06/24/2021 2. Fibromyalgia: Continue  Home Exercise Program. Continue to Monitor.06/24/2021 3. Bilateral intermittent hand numbness: Carpal tunnel syndrome versus C6 radiculopathy mild: No complaints today. Continue to Monitor. 06/24/2021 4. Chronic Midline Low Back Pain/ LBP: Refilled: Norco 5/325mg  # 140 pills--use one pill every 6 hours as needed for pain. A second prescription was e-scribe for the following month 06/24/2021. We will continue the opioid monitoring program, this consists of regular clinic visits, examinations, urine drug screen, pill counts as well as use of [place] Controlled Substance Reporting system. A 12 month History has been reviewed on the [place] Controlled Substance Reporting System on 06/24/2021.. 5. Muscle Spasm: Continue current medication regimen with  Robaxin. 06/24/2021 6.Bilateral  Greater Trochanteric Bursitis:Continue to Alternate with heat and ice therapy. Continue current medication regime. 06/24/2021  7. Left Foot pain: No complaints today.Continue with HEP as Tolerated. Continue to Monitor. 06/24/2021 8. Right Lateral Epicondylitis Pain: No complaints Today. Continue HEP as Tolerated. Continue to Monitor. 06/24/2021 9. Right Shoulder Tendonitis:  No complaints today. Continue Current Medication Regimen. Continue to Alternate Ice and Heat Therapy. 06/24/2021  10. Bilateral Chronic Knee Pain: No complaints tody. Continue HEP as Tolerated. Continue to Monitor. 06/24/2021   F/U in 2 months

## 2021-06-25 DIAGNOSIS — H02002 Unspecified entropion of right lower eyelid: Secondary | ICD-10-CM | POA: Diagnosis not present

## 2021-06-25 DIAGNOSIS — H16141 Punctate keratitis, right eye: Secondary | ICD-10-CM | POA: Diagnosis not present

## 2021-06-25 DIAGNOSIS — H04123 Dry eye syndrome of bilateral lacrimal glands: Secondary | ICD-10-CM | POA: Diagnosis not present

## 2021-07-03 ENCOUNTER — Telehealth: Payer: Self-pay | Admitting: *Deleted

## 2021-07-03 LAB — TOXASSURE SELECT,+ANTIDEPR,UR

## 2021-07-03 NOTE — Telephone Encounter (Signed)
Urine drug screen for this encounter is consistent for prescribed medication 

## 2021-07-04 ENCOUNTER — Telehealth: Payer: Self-pay | Admitting: Internal Medicine

## 2021-07-04 NOTE — Telephone Encounter (Signed)
Left message to call back  

## 2021-07-04 NOTE — Telephone Encounter (Signed)
   Pt c/o medication issue:  1. Name of Medication: valsartan (DIOVAN) 160 MG tablet valsartan (DIOVAN) 80 MG tablet  2. How are you currently taking this medication (dosage and times per day)?   3. Are you having a reaction (difficulty breathing--STAT)?   4. What is your medication issue? Maceo Pro with [facility] would like to verify which one of these meds pt is currently taking

## 2021-07-08 NOTE — Telephone Encounter (Signed)
Kara Bush from [facility] returning call. Phone: [phone]

## 2021-07-08 NOTE — Telephone Encounter (Signed)
[place] and advised of last OV showing Valsartan 160 mg on list.  They verbalized understanding, thankful for call back.

## 2021-07-12 DIAGNOSIS — G4733 Obstructive sleep apnea (adult) (pediatric): Secondary | ICD-10-CM | POA: Diagnosis not present

## 2021-07-22 DIAGNOSIS — H02002 Unspecified entropion of right lower eyelid: Secondary | ICD-10-CM | POA: Diagnosis not present

## 2021-07-31 DIAGNOSIS — I10 Essential (primary) hypertension: Secondary | ICD-10-CM | POA: Diagnosis not present

## 2021-07-31 DIAGNOSIS — I2510 Atherosclerotic heart disease of native coronary artery without angina pectoris: Secondary | ICD-10-CM | POA: Diagnosis not present

## 2021-07-31 DIAGNOSIS — E785 Hyperlipidemia, unspecified: Secondary | ICD-10-CM | POA: Diagnosis not present

## 2021-07-31 DIAGNOSIS — F339 Major depressive disorder, recurrent, unspecified: Secondary | ICD-10-CM | POA: Diagnosis not present

## 2021-07-31 DIAGNOSIS — G8929 Other chronic pain: Secondary | ICD-10-CM | POA: Diagnosis not present

## 2021-07-31 DIAGNOSIS — M1990 Unspecified osteoarthritis, unspecified site: Secondary | ICD-10-CM | POA: Diagnosis not present

## 2021-07-31 DIAGNOSIS — F3341 Major depressive disorder, recurrent, in partial remission: Secondary | ICD-10-CM | POA: Diagnosis not present

## 2021-07-31 DIAGNOSIS — I4891 Unspecified atrial fibrillation: Secondary | ICD-10-CM | POA: Diagnosis not present

## 2021-08-07 DIAGNOSIS — H02002 Unspecified entropion of right lower eyelid: Secondary | ICD-10-CM | POA: Diagnosis not present

## 2021-08-12 DIAGNOSIS — G4733 Obstructive sleep apnea (adult) (pediatric): Secondary | ICD-10-CM | POA: Diagnosis not present

## 2021-08-14 ENCOUNTER — Other Ambulatory Visit: Payer: Self-pay

## 2021-08-14 ENCOUNTER — Ambulatory Visit (HOSPITAL_COMMUNITY): Payer: PPO | Attending: Cardiology

## 2021-08-14 DIAGNOSIS — I712 Thoracic aortic aneurysm, without rupture, unspecified: Secondary | ICD-10-CM | POA: Diagnosis not present

## 2021-08-14 LAB — ECHOCARDIOGRAM COMPLETE
AR max vel: 1.05 cm2
AV Area VTI: 1.03 cm2
AV Area mean vel: 1.02 cm2
AV Mean grad: 14 mmHg
AV Peak grad: 18.9 mmHg
Ao pk vel: 2.17 m/s
Area-P 1/2: 3.77 cm2
P 1/2 time: 283 msec
S' Lateral: 2.8 cm

## 2021-08-15 ENCOUNTER — Telehealth (INDEPENDENT_AMBULATORY_CARE_PROVIDER_SITE_OTHER): Payer: PPO | Admitting: Cardiology

## 2021-08-15 ENCOUNTER — Encounter: Payer: Self-pay | Admitting: Cardiology

## 2021-08-15 VITALS — Ht 64.0 in | Wt 185.0 lb

## 2021-08-15 DIAGNOSIS — I10 Essential (primary) hypertension: Secondary | ICD-10-CM | POA: Diagnosis not present

## 2021-08-15 DIAGNOSIS — G4733 Obstructive sleep apnea (adult) (pediatric): Secondary | ICD-10-CM

## 2021-08-15 NOTE — Progress Notes (Signed)
Virtual Visit via Video Note   This visit type was conducted due to national recommendations for restrictions regarding the COVID-19 Pandemic (e.g. social distancing) in an effort to limit this patient's exposure and mitigate transmission in our community.  Due to her co-morbid illnesses, this patient is at least at moderate risk for complications without adequate follow up.  This format is felt to be most appropriate for this patient at this time.  All issues noted in this document were discussed and addressed.  A limited physical exam was performed with this format.  Please refer to the patient's chart for her consent to telehealth for [facility]     Date:  08/15/2021   ID:  Kara Bush, DOB 1940-08-19, MRN [id number] The patient was identified using 2 identifiers.  Patient Location: Home Provider Location: Home Office   PCP:  Rankins, Bill Salinas, MD   [facility] Cardiologist:  Pixie Casino, MD     Evaluation Performed:  Follow-Up Visit  Chief Complaint:  OSA  History of Present Illness:    Kara Bush is a 81 y.o. female with a history of abdominal aortic aneurysm, CAD status post CABG, fibromyalgia, hypertension and OSA initially on CPAP but has not worn her CPAP in years. She only wore it for about 3 months at that time and stopped because it was too uncomfortable and she started dating someone.   Her initial sleep evaluation was over 15 years ago.  She was seen in cardiology clinic and due to her history of OSA and not being on CPAP a repeat sleep study was performed.  She was found to have severe obstructive sleep apnea with an AHI of 63.4/h and nocturnal hypoxemia with an O2 sat nadir of 79%.  She underwent CPAP titration to 11 cm H2O and is now here for follow-up.  She is doing well with his CPAP device and thinks that she has gotten used to it.  She tolerates the mask and feels the pressure is adequate.  Since going on CPAP He feels rested  in the am and has no significant daytime sleepiness.  He denies any significant mouth or nasal dryness or nasal congestion.  She does not think that he snores.     The patient does not have symptoms concerning for COVID-19 infection (fever, chills, cough, or new shortness of breath).    Past Medical History:  Diagnosis Date   Abdominal aortic aneurysm    Anemia, improved 01/11/2013   Basal cell carcinoma    CAD (coronary artery disease)    hx CABG 1998, last cath 2007 with PCI and stenting to the proximal segment of the vein graft to the diagonal branch. DES Taxus was placed   Cervicalgia    Chest wall pain    Chronic fatigue fibromyalgia syndrome    Chronic pain syndrome    Coronary artery disease    Depression    Fibromyalgia    Fibromyalgia    History of nuclear stress test 08/07/2011   lexiscan; no evidence of ischemia or infarct; low risk    Hyperlipidemia    Hypertension    Hypertension    Obstructive sleep apnea    OSA (obstructive sleep apnea)    PAD (peripheral artery disease) (HCC)    Pes anserine bursitis    Pulmonary embolism ([place]) 01/11/2013   S/P resection of aortic aneurysm    Trochanteric bursitis    Ulcerative colitis [facility])    Past Surgical History:  Procedure  Laterality Date   [place]   CATARACT EXTRACTION Left 09/2019   CHOLECYSTECTOMY  1997   CORONARY ANGIOPLASTY WITH STENT PLACEMENT  2006   to LAD, ATRETIC LIMA AT THAT TIME ALSO   CORONARY ANGIOPLASTY WITH STENT PLACEMENT  2007   STENT TO PROX VG-DIAG, OTHER STENTS PATENT   CORONARY ARTERY BYPASS GRAFT  1998   LIMA-LAD;VG-DIAG & RCA   CORONARY STENT PLACEMENT  2005   TO LAD & LCX-STENTS,   HERNIA REPAIR     TUBAL LIGATION     BTL     Current Meds  Medication Sig   b complex vitamins capsule Take 1 capsule by mouth daily.   citalopram (CELEXA) 20 MG tablet Take 20 mg by mouth daily.    diclofenac sodium (VOLTAREN) 1 % GEL Apply 1 application topically daily as needed  (joint pain).   ELIQUIS 5 MG TABS tablet TAKE ONE TABLET BY MOUTH TWICE DAILY   ezetimibe (ZETIA) 10 MG tablet TAKE ONE TABLET BY MOUTH ONCE DAILY   HYDROcodone-acetaminophen (NORCO/VICODIN) 5-325 MG tablet Take 1 tablet by mouth 4 (four) times daily as needed for moderate pain. May Take an extra 0.5 to one tablet when pain is severe.   methocarbamol (ROBAXIN) 500 MG tablet Take 1 tablet (500 mg total) by mouth every 8 (eight) hours as needed for muscle spasms.   Multiple Vitamin (MULITIVITAMIN WITH MINERALS) TABS Take 1 tablet by mouth daily.   ondansetron (ZOFRAN) 4 MG tablet Take 1 tablet (4 mg total) by mouth every 6 (six) hours as needed for nausea.   simvastatin (ZOCOR) 40 MG tablet TAKE ONE TABLET BY MOUTH ONCE DAILY   valsartan (DIOVAN) 160 MG tablet Take 160 mg by mouth daily.   valsartan (DIOVAN) 80 MG tablet Take 1 tablet (80 mg total) by mouth daily.   zolpidem (AMBIEN) 5 MG tablet Take 5 mg by mouth at bedtime.     Allergies:   Biotin, Lisinopril, Atenolol, Bactrim [sulfamethoxazole-trimethoprim], Cymbalta [duloxetine hcl], Lyrica [pregabalin], and Penicillins   Social History   Tobacco Use   Smoking status: Former    Packs/day: 2.50    Years: 20.00    Pack years: 50.00    Types: Cigarettes    Quit date: 06/29/1990    Years since quitting: 31.1   Smokeless tobacco: Never  Vaping Use   Vaping Use: Never used  Substance Use Topics   Alcohol use: No   Drug use: No     Family Hx: The patient's family history includes Cancer in her brother and paternal aunt; Healthy in her daughter, daughter, and sister; Heart attack in her father, maternal grandfather, and paternal grandmother; Heart disease in her father; Heart failure in her maternal grandmother and mother.  ROS:   Please see the history of present illness.     All other systems reviewed and are negative.   Prior CV studies:   The following studies were reviewed today:  Split night sleep study  Labs/Other  Tests and Data Reviewed:    EKG:  No ECG reviewed.  Recent Labs: 02/26/2021: B Natriuretic Peptide 263.1 02/27/2021: ALT 16; Hemoglobin 11.4; Platelets 195 04/11/2021: BUN 20; Creatinine, Ser 1.45; Potassium 4.7; Sodium 142   Recent Lipid Panel Lab Results  Component Value Date/Time   CHOL 141 10/13/2019 08:17 AM   TRIG 128 10/13/2019 08:17 AM   HDL 56 10/13/2019 08:17 AM   CHOLHDL 2.5 10/13/2019 08:17 AM   LDLCALC 63 10/13/2019 08:17 AM  Wt Readings from Last 3 Encounters:  08/15/21 185 lb (83.9 kg)  06/24/21 189 lb (85.7 kg)  06/19/21 188 lb 3.2 oz (85.4 kg)     Objective:    Vital Signs:  Ht 5\' 4"  (1.626 m)    Wt 185 lb (83.9 kg)    BMI 31.76 kg/m    VITAL SIGNS:  reviewed GEN:  no acute distress EYES:  sclerae anicteric, EOMI - Extraocular Movements Intact RESPIRATORY:  normal respiratory effort, symmetric expansion CARDIOVASCULAR:  no peripheral edema SKIN:  no rash, lesions or ulcers. MUSCULOSKELETAL:  no obvious deformities. NEURO:  alert and oriented x 3, no obvious focal deficit PSYCH:  normal affect  ASSESSMENT & PLAN:    OSA - The patient is tolerating PAP therapy well without any problems. The PAP download performed by his DME was personally reviewed and interpreted by me today and showed an AHI of 10.6 /hr on 11cm H2O with 26% compliance in using more than 4 hours nightly.  The patient has been using and benefiting from PAP use and will continue to benefit from therapy.  -I will change her to auto CPAP from 4 to 20cm H2O and get a download in 4 weeks   HTN -BP is adequately controlled at home -Continue prescription drug management with valsartan 160 mg daily, amlodipine 5 mg daily, Toprol-XL 50 mg daily with as needed refills.    COVID-19 Education: The signs and symptoms of COVID-19 were discussed with the patient and how to seek care for testing (follow up with PCP or arrange E-visit).  The importance of social distancing was discussed today.  Time:    Today, I have spent 20 minutes with the patient with telehealth technology discussing the above problems.     Medication Adjustments/Labs and Tests Ordered: Current medicines are reviewed at length with the patient today.  Concerns regarding medicines are outlined above.   Tests Ordered: No orders of the defined types were placed in this encounter.   Medication Changes: No orders of the defined types were placed in this encounter.   Follow Up:  8 weeks  Signed, Fransico Him, MD  08/15/2021 3:57 PM    [place]

## 2021-08-15 NOTE — Patient Instructions (Signed)
Medication Instructions:  Your physician recommends that you continue on your current medications as directed. Please refer to the Current Medication list given to you today.  *If you need a refill on your cardiac medications before your next appointment, please call your pharmacy*  Follow-Up: At [facility], you and your health needs are our priority.  As part of our continuing mission to provide you with exceptional heart care, we have created designated Provider Care Teams.  These Care Teams include your primary Cardiologist (physician) and Advanced Practice Providers (APPs -  Physician Assistants and Nurse Practitioners) who all work together to provide you with the care you need, when you need it.  Your next appointment:   03/23 at 11:00am  The format for your next appointment:   Virtual Visit   Provider:   Fransico Him, MD

## 2021-08-16 ENCOUNTER — Other Ambulatory Visit: Payer: Self-pay | Admitting: Internal Medicine

## 2021-08-16 ENCOUNTER — Encounter: Payer: Self-pay | Admitting: Internal Medicine

## 2021-08-16 DIAGNOSIS — I350 Nonrheumatic aortic (valve) stenosis: Secondary | ICD-10-CM

## 2021-08-20 DIAGNOSIS — H02052 Trichiasis without entropian right lower eyelid: Secondary | ICD-10-CM | POA: Diagnosis not present

## 2021-08-26 ENCOUNTER — Other Ambulatory Visit: Payer: Self-pay

## 2021-08-26 ENCOUNTER — Encounter: Payer: PPO | Attending: Physical Medicine and Rehabilitation | Admitting: Registered Nurse

## 2021-08-26 ENCOUNTER — Encounter: Payer: Self-pay | Admitting: Registered Nurse

## 2021-08-26 VITALS — BP 150/68 | HR 80 | Ht 64.0 in | Wt 185.0 lb

## 2021-08-26 DIAGNOSIS — M797 Fibromyalgia: Secondary | ICD-10-CM | POA: Diagnosis not present

## 2021-08-26 DIAGNOSIS — Z5181 Encounter for therapeutic drug level monitoring: Secondary | ICD-10-CM | POA: Diagnosis not present

## 2021-08-26 DIAGNOSIS — M7061 Trochanteric bursitis, right hip: Secondary | ICD-10-CM | POA: Diagnosis not present

## 2021-08-26 DIAGNOSIS — M545 Low back pain, unspecified: Secondary | ICD-10-CM | POA: Diagnosis not present

## 2021-08-26 DIAGNOSIS — M25562 Pain in left knee: Secondary | ICD-10-CM | POA: Insufficient documentation

## 2021-08-26 DIAGNOSIS — G894 Chronic pain syndrome: Secondary | ICD-10-CM | POA: Diagnosis not present

## 2021-08-26 DIAGNOSIS — M25561 Pain in right knee: Secondary | ICD-10-CM | POA: Diagnosis not present

## 2021-08-26 DIAGNOSIS — G8929 Other chronic pain: Secondary | ICD-10-CM

## 2021-08-26 DIAGNOSIS — M7062 Trochanteric bursitis, left hip: Secondary | ICD-10-CM | POA: Insufficient documentation

## 2021-08-26 DIAGNOSIS — Z79891 Long term (current) use of opiate analgesic: Secondary | ICD-10-CM

## 2021-08-26 MED ORDER — HYDROCODONE-ACETAMINOPHEN 5-325 MG PO TABS: 1.0000 | ORAL_TABLET | Freq: Four times a day (QID) | ORAL | 0 refills | Status: DC | PRN

## 2021-08-26 NOTE — Progress Notes (Signed)
Subjective:    Patient ID: Kara Bush, female    DOB: 1941-02-03, 81 y.o.   MRN: [id number]  HPI: Kara Bush is a 81 y.o. female who returns for follow up appointment for chronic pain and medication refill. She states her pain is located in her mid- lower back, bilateral hip pain and bilateral knee pain. She rates her pain 4. Her current exercise regime is walking, she was encouraged her HEP as tolerated, she verbalizes understanding.   Kara Bush Morphine equivalent is 22.04 MME.   Last UDS was Performed on 06/24/2022, it was consistent.     Pain Inventory Average Pain 4 Pain Right Now 4 My pain is constant and aching  In the last 24 hours, has pain interfered with the following? General activity 4 Relation with others 2 Enjoyment of life 2 What TIME of day is your pain at its worst? morning  Sleep (in general) Fair  Pain is worse with: walking and some activites Pain improves with: rest and medication Relief from Meds: 7  Family History  Problem Relation Age of Onset   Heart disease Father    Heart attack Father        @ 90   Cancer Paternal Aunt        BREAST   Heart failure Mother        at 60   Healthy Sister    Cancer Brother        pancreatic    Heart failure Maternal Grandmother    Heart attack Maternal Grandfather    Heart attack Paternal Grandmother        at 93   Healthy Daughter    Healthy Daughter    Social History   Socioeconomic History   Marital status: Divorced    Spouse name: Not on file   Number of children: 2   Years of education: college    Highest education level: Not on file  Occupational History    Employer: RETIRED  Tobacco Use   Smoking status: Former    Packs/day: 2.50    Years: 20.00    Pack years: 50.00    Types: Cigarettes    Quit date: 06/29/1990    Years since quitting: 31.1   Smokeless tobacco: Never  Vaping Use   Vaping Use: Never used  Substance and Sexual Activity   Alcohol use: No   Drug use:  No   Sexual activity: Not on file  Other Topics Concern   Not on file  Social History Narrative   Not on file   Social Determinants of Health   Financial Resource Strain: Not on file  Food Insecurity: Not on file  Transportation Needs: Not on file  Physical Activity: Not on file  Stress: Not on file  Social Connections: Not on file   Past Surgical History:  Procedure Laterality Date   [place]   CATARACT EXTRACTION Left 09/2019   [place]  2006   to LAD, ATRETIC LIMA AT THAT TIME ALSO   CORONARY ANGIOPLASTY WITH STENT PLACEMENT  2007   STENT TO PROX VG-DIAG, OTHER STENTS PATENT   CORONARY ARTERY BYPASS GRAFT  1998   LIMA-LAD;VG-DIAG & RCA   CORONARY STENT PLACEMENT  2005   TO LAD & LCX-STENTS,   HERNIA REPAIR     TUBAL LIGATION     BTL   Past Surgical History:  Procedure Laterality Date   AORTOBIEXTERNAL  ILIAC BYPASS  1991   CATARACT EXTRACTION Left 09/2019   CHOLECYSTECTOMY  1997   CORONARY ANGIOPLASTY WITH STENT PLACEMENT  2006   to LAD, ATRETIC LIMA AT THAT TIME ALSO   CORONARY ANGIOPLASTY WITH STENT PLACEMENT  2007   STENT TO PROX VG-DIAG, OTHER STENTS PATENT   CORONARY ARTERY BYPASS GRAFT  1998   LIMA-LAD;VG-DIAG & RCA   CORONARY STENT PLACEMENT  2005   TO LAD & LCX-STENTS,   HERNIA REPAIR     TUBAL LIGATION     BTL   Past Medical History:  Diagnosis Date   Abdominal aortic aneurysm    Anemia, improved 01/11/2013   Basal cell carcinoma    CAD (coronary artery disease)    hx CABG 1998, last cath 2007 with PCI and stenting to the proximal segment of the vein graft to the diagonal branch. DES Taxus was placed   Cervicalgia    Chest wall pain    Chronic fatigue fibromyalgia syndrome    Chronic pain syndrome    Coronary artery disease    Depression    Fibromyalgia    Fibromyalgia    History of nuclear stress test 08/07/2011   lexiscan; no evidence of ischemia or infarct;  low risk    Hyperlipidemia    Hypertension    Hypertension    Obstructive sleep apnea    OSA (obstructive sleep apnea)    PAD (peripheral artery disease) (HCC)    Pes anserine bursitis    Pulmonary embolism ([place]) 01/11/2013   S/P resection of aortic aneurysm    Trochanteric bursitis    Ulcerative colitis (HCC)    BP (!) 150/68    Pulse 80    Ht 5\' 4"  (1.626 m)    Wt 185 lb (83.9 kg)    SpO2 93%    BMI 31.76 kg/m   Opioid Risk Score:   Fall Risk Score:  `1  Depression screen PHQ 2/9  Depression screen [facility] 2/9 06/24/2021 04/23/2021 06/04/2020 02/02/2020 08/11/2019 04/13/2018 02/12/2018  Decreased Interest 0 0 0 0 0 0 0  Down, Depressed, Hopeless 0 0 0 0 0 0 0  PHQ - 2 Score 0 0 0 0 0 0 0  Tired, decreased energy - - - - - - -  Change in appetite - - - - - - -  Feeling bad or failure about yourself  - - - - - - -  Trouble concentrating - - - - - - -  Moving slowly or fidgety/restless - - - - - - -  Suicidal thoughts - - - - - - -  Some recent data might be hidden     Review of Systems  Constitutional: Negative.   HENT: Negative.    Eyes: Negative.   Respiratory: Negative.    Cardiovascular: Negative.   Gastrointestinal: Negative.   Endocrine: Negative.   Genitourinary: Negative.   Musculoskeletal: Negative.   Skin: Negative.   Allergic/Immunologic: Negative.   Neurological: Negative.   Hematological: Negative.   Psychiatric/Behavioral: Negative.        Objective:   Physical Exam Vitals and nursing note reviewed.  Constitutional:      Appearance: Normal appearance.  Cardiovascular:     Rate and Rhythm: Normal rate and regular rhythm.     Pulses: Normal pulses.     Heart sounds: Normal heart sounds.  Pulmonary:     Effort: Pulmonary effort is normal.     Breath sounds: Normal breath sounds.  Musculoskeletal:  Cervical back: Normal range of motion and neck supple.     Comments: Normal Muscle Bulk and Muscle Testing Reveals:  Upper Extremities: Full ROM and Muscle  Strength 5/5  Thoracic Paraspinal Tenderness:T-7-T-9 Lumbar Paraspinal Tenderness: L-4-L-5 Bilateral Greater Trochanter Tenderness Lower Extremities: Full ROM and Muscle Strength 5/5 Arises from chair with ease Narrow Based  Gait     Skin:    General: Skin is warm and dry.  Neurological:     Mental Status: She is alert and oriented to person, place, and time.  Psychiatric:        Mood and Affect: Mood normal.        Behavior: Behavior normal.         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 08/26/2021 2. Fibromyalgia: Continue  Home Exercise Program. Continue to Monitor.08/26/2021 3. Bilateral intermittent hand numbness: Carpal tunnel syndrome versus C6 radiculopathy mild: No complaints today. Continue to Monitor. 08/26/2021 4. Chronic Midline Low Back Pain/ LBP: Refilled: Norco 5/325mg  # 140 pills--use one pill every 6 hours as needed for pain. A second prescription was e-scribe for the following month 08/26/2021. We will continue the opioid monitoring program, this consists of regular clinic visits, examinations, urine drug screen, pill counts as well as use of [place] Controlled Substance Reporting system. A 12 month History has been reviewed on the [place] Controlled Substance Reporting System on 08/26/2021.. 5. Muscle Spasm: Continue current medication regimen with  Robaxin. 08/26/2021 6.Bilateral  Greater Trochanteric Bursitis:Continue to Alternate with heat and ice therapy. Continue current medication regime. 08/26/2021  7. Left Foot pain: No complaints today.Continue with HEP as Tolerated. Continue to Monitor. 08/26/2021 8. Right Lateral Epicondylitis Pain: No complaints Today. Continue HEP as Tolerated. Continue to Monitor. 08/26/2021 9. Right Shoulder Tendonitis:  No complaints today. Continue Current Medication Regimen. Continue to  Alternate Ice and Heat Therapy. 08/26/2021 10. Bilateral Chronic Knee Pain: No complaints tody. Continue HEP as Tolerated. Continue to Monitor. 08/26/2021   F/U in 2 months

## 2021-09-05 DIAGNOSIS — I4891 Unspecified atrial fibrillation: Secondary | ICD-10-CM | POA: Diagnosis not present

## 2021-09-05 DIAGNOSIS — I10 Essential (primary) hypertension: Secondary | ICD-10-CM | POA: Diagnosis not present

## 2021-09-05 DIAGNOSIS — E7800 Pure hypercholesterolemia, unspecified: Secondary | ICD-10-CM | POA: Diagnosis not present

## 2021-09-12 DIAGNOSIS — G4733 Obstructive sleep apnea (adult) (pediatric): Secondary | ICD-10-CM | POA: Diagnosis not present

## 2021-09-25 DIAGNOSIS — F419 Anxiety disorder, unspecified: Secondary | ICD-10-CM | POA: Insufficient documentation

## 2021-09-25 DIAGNOSIS — H35033 Hypertensive retinopathy, bilateral: Secondary | ICD-10-CM | POA: Diagnosis not present

## 2021-09-25 DIAGNOSIS — H16143 Punctate keratitis, bilateral: Secondary | ICD-10-CM | POA: Diagnosis not present

## 2021-09-25 DIAGNOSIS — I10 Essential (primary) hypertension: Secondary | ICD-10-CM | POA: Diagnosis not present

## 2021-09-26 DIAGNOSIS — H02045 Spastic entropion of left lower eyelid: Secondary | ICD-10-CM | POA: Diagnosis not present

## 2021-09-26 DIAGNOSIS — D485 Neoplasm of uncertain behavior of skin: Secondary | ICD-10-CM | POA: Diagnosis not present

## 2021-09-26 DIAGNOSIS — H02035 Senile entropion of left lower eyelid: Secondary | ICD-10-CM | POA: Diagnosis not present

## 2021-09-26 DIAGNOSIS — H02032 Senile entropion of right lower eyelid: Secondary | ICD-10-CM | POA: Diagnosis not present

## 2021-09-26 DIAGNOSIS — H02042 Spastic entropion of right lower eyelid: Secondary | ICD-10-CM | POA: Diagnosis not present

## 2021-09-30 ENCOUNTER — Telehealth: Payer: Self-pay

## 2021-09-30 ENCOUNTER — Telehealth: Payer: Self-pay | Admitting: Internal Medicine

## 2021-09-30 DIAGNOSIS — M797 Fibromyalgia: Secondary | ICD-10-CM

## 2021-09-30 MED ORDER — HYDROCODONE-ACETAMINOPHEN 5-325 MG PO TABS: 1.0000 | ORAL_TABLET | Freq: Four times a day (QID) | ORAL | 0 refills | Status: DC | PRN

## 2021-09-30 NOTE — Telephone Encounter (Signed)
Pt c/o medication issue:  1. Name of Medication:    2. How are you currently taking this medication (dosage and times per day)?    3. Are you having a reaction (difficulty breathing--STAT)? no  4. What is your medication issue? Patient is unsure of what meds it is. But she has gain 10lbs in a week and her ankles are more swollen then before. Please advise

## 2021-09-30 NOTE — Telephone Encounter (Signed)
Patient called stating to send prescription to [facility]

## 2021-09-30 NOTE — Telephone Encounter (Signed)
Spoke with pt, she reports for the last month or so she has increased swelling in her feet. She reports they are swollen all the time, they do not go down overnight. She stays off her feet and elevates them as much as possible and elevates her legs while sitting. She does have SOB normally but reports it seems to be getting a little worse. She denies orthopnea. Her blood pressure has been running high recently, 164/76, 178/90,167/85 and 174/78. Medications confirmed and she takes all her blood pressure medications in the morning and she checks her bp in the evening. Aware dr Debara Pickett is not in the office but will forward for his review.

## 2021-09-30 NOTE — Telephone Encounter (Signed)
PMP was Reviewed,  Hydrocodone e-scribed today.  Placed a call to Ms. Clappers, regarding the above. She verbalizes understanding.

## 2021-09-30 NOTE — Telephone Encounter (Signed)
Sofie Hartigan called: [facility] Hydrocodone 5-325 MG on back order.   I call the pharmacy at 2:06pm today to cancel the order. She will call back with a new location to send in the refill. She thinks [facility] on [address] has it in stock.

## 2021-10-01 NOTE — Telephone Encounter (Signed)
Spoke with pt, aware of dr hilty's recommendations. Follow up scheduled

## 2021-10-02 ENCOUNTER — Encounter (HOSPITAL_BASED_OUTPATIENT_CLINIC_OR_DEPARTMENT_OTHER): Payer: Self-pay | Admitting: General Practice

## 2021-10-02 ENCOUNTER — Other Ambulatory Visit: Payer: Self-pay

## 2021-10-02 ENCOUNTER — Ambulatory Visit (HOSPITAL_BASED_OUTPATIENT_CLINIC_OR_DEPARTMENT_OTHER): Payer: PPO | Admitting: General Practice

## 2021-10-02 VITALS — BP 154/68 | HR 74 | Ht 64.0 in | Wt 194.8 lb

## 2021-10-02 DIAGNOSIS — R600 Localized edema: Secondary | ICD-10-CM

## 2021-10-02 DIAGNOSIS — I4891 Unspecified atrial fibrillation: Secondary | ICD-10-CM

## 2021-10-02 DIAGNOSIS — I739 Peripheral vascular disease, unspecified: Secondary | ICD-10-CM | POA: Diagnosis not present

## 2021-10-02 DIAGNOSIS — E782 Mixed hyperlipidemia: Secondary | ICD-10-CM | POA: Diagnosis not present

## 2021-10-02 DIAGNOSIS — I10 Essential (primary) hypertension: Secondary | ICD-10-CM | POA: Diagnosis not present

## 2021-10-02 DIAGNOSIS — I712 Thoracic aortic aneurysm, without rupture, unspecified: Secondary | ICD-10-CM | POA: Diagnosis not present

## 2021-10-02 DIAGNOSIS — Z951 Presence of aortocoronary bypass graft: Secondary | ICD-10-CM | POA: Diagnosis not present

## 2021-10-02 DIAGNOSIS — G4733 Obstructive sleep apnea (adult) (pediatric): Secondary | ICD-10-CM | POA: Diagnosis not present

## 2021-10-02 MED ORDER — POTASSIUM CHLORIDE ER 10 MEQ PO TBCR: EXTENDED_RELEASE_TABLET | ORAL | 0 refills | Status: DC

## 2021-10-02 MED ORDER — FUROSEMIDE 20 MG PO TABS: ORAL_TABLET | ORAL | 0 refills | Status: DC

## 2021-10-02 NOTE — Progress Notes (Signed)
Cardiology Office Note:    Date:  10/02/2021   ID:  Kara Bush, DOB 26-Dec-1940, MRN [id number]  PCP:  Kara Nip, MD   [facility] Cardiologist:  Pixie Casino, MD      Referring MD: Kara Nip, MD   Follow-up for atrial fibrillation and lower extremity edema  History of Present Illness:    Kara Bush is a 81 y.o. female with a hx of  coronary artery disease status post CABG in 1998, PAD status post iliac bypass, TIA, PE 2/14, paroxysmal atrial fibrillation diagnosed in 2/20, HLD, and HTN.  Her echocardiogram 1/19 showed an EF 60-65%, G1 DD, mild AI, and mild MR.    She was seen by Dr. Debara Pickett 12/21.  During that time her metoprolol succinate was increased to 25 mg daily.  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    She presented to the emergency department at [facility] on 02/26/2021 and was discharged on 02/28/2021.  She presented with A. fib RVR.  She reported that she had been noticing atrial fibrillation on her apple watch for the past week.  She reported heart rates in the 130-150 range.  On arrival to [facility] heart rate was 110-130.  Her serial troponins were negative.  Her BNP was borderline at 263.1.  She was negative for COVID.  Her chest x-ray showed trace amount of fluid within minor fissure and was similar to prior studies.  Her EKG showed atrial flutter rather than atrial fibrillation.  She received a dose of metoprolol tartrate 25 mg in the emergency room.  She converted back to sinus rhythm.  By the time cardiology service examined the patient she had been in sinus rhythm for 12 hours.  She reported compliance with her apixaban.  Her metoprolol succinate was increased to 50 mg daily with instructions to take an additional 25 mg as needed.  Her echocardiogram 02/27/2021 showed an EF of 55-60%, moderate LVH, mild mitral valve regurgitation, trivial  tricuspid valve regurgitation, and mild dilation of the ascending aortic root at 38 mm.     She presented to the clinic 03/28/21 for follow-up evaluation stated she felt well.  She had 2 episodes of increased heart rate over the previous month.  They were brief episodes and she returned to regular rate without intervention.  She reported that her blood pressure over the last 2 weeks had been elevated.  She was noticing blood pressures in the 150-160/80 range at home.  Initially in the office today her blood pressure was 160/80 and on follow-up it was 146/78.  She reported dietary indiscretion.  She did not have interest in changing her diet.  She also reported that she was previously a patient of Dr. Radford Pax and wore CPAP.  She had not worn her CPAP in a few years.  Her initial sleep evaluation she believed was over 13 years ago.  I ordered repeat  sleep evaluation, asked her to increase her physical activity as tolerated, gave the salty 6 diet sheet, increased her valsartan, and planned follow-up after her sleep study.  She was seen by Dr. Radford Pax on 08/15/2021.  At that time she was doing well with her CPAP.  She felt that she had gotten used to the device.  She was tolerating her mask and felt the pressure was appropriate.  She reported feeling rested and denies daytime sleepiness.  She presents  to the clinic today for follow-up evaluation states she has noticed that she has lower extremity swelling.  She feels that her weight is up around 10 pounds.  She reports dietary indiscretion.  She reports that she continues to eat the salty foods that she enjoys.  We reviewed the importance of daily weights and elevating her lower extremities.  She reports compliance with her CPAP.  Her blood pressures at home have been somewhat elevated.  I will prescribe furosemide and supplemental potassium.  We will repeat a BMP in 1 week and plan follow-up in 1 month.     Today she denies chest pain, shortness of breath,  increased fatigue, palpitations, melena, hematuria, hemoptysis, diaphoresis, weakness, presyncope, syncope, orthopnea, and PND.  Past Medical History:  Diagnosis Date   Abdominal aortic aneurysm    Anemia, improved 01/11/2013   Basal cell carcinoma    CAD (coronary artery disease)    hx CABG 1998, last cath 2007 with PCI and stenting to the proximal segment of the vein graft to the diagonal branch. DES Taxus was placed   Cervicalgia    Chest wall pain    Chronic fatigue fibromyalgia syndrome    Chronic pain syndrome    Coronary artery disease    Depression    Fibromyalgia    Fibromyalgia    History of nuclear stress test 08/07/2011   lexiscan; no evidence of ischemia or infarct; low risk    Hyperlipidemia    Hypertension    Hypertension    Obstructive sleep apnea    OSA (obstructive sleep apnea)    PAD (peripheral artery disease) (HCC)    Pes anserine bursitis    Pulmonary embolism ([place]) 01/11/2013   S/P resection of aortic aneurysm    Trochanteric bursitis    Ulcerative colitis ([place])     Past Surgical History:  Procedure Laterality Date   AORTOBIEXTERNAL ILIAC BYPASS  1991   CATARACT EXTRACTION Left 09/2019   CHOLECYSTECTOMY  1997   CORONARY ANGIOPLASTY WITH STENT PLACEMENT  2006   to LAD, ATRETIC LIMA AT THAT TIME ALSO   CORONARY ANGIOPLASTY WITH STENT PLACEMENT  2007   STENT TO PROX VG-DIAG, OTHER STENTS PATENT   CORONARY ARTERY BYPASS GRAFT  1998   LIMA-LAD;VG-DIAG & RCA   CORONARY STENT PLACEMENT  2005   TO LAD & LCX-STENTS,   HERNIA REPAIR     TUBAL LIGATION     BTL    Current Medications: Current Meds  Medication Sig   amLODipine (NORVASC) 5 MG tablet Take 1 tablet by mouth daily at 12 noon.   b complex vitamins capsule Take 1 capsule by mouth daily.   citalopram (CELEXA) 20 MG tablet Take 20 mg by mouth daily.    diclofenac sodium (VOLTAREN) 1 % GEL Apply 1 application topically daily as needed (joint pain).   ELIQUIS 5 MG TABS tablet TAKE ONE TABLET BY  MOUTH TWICE DAILY   ezetimibe (ZETIA) 10 MG tablet TAKE ONE TABLET BY MOUTH ONCE DAILY   furosemide (LASIX) 20 MG tablet Take one tablet by mouth daily for 4 days   HYDROcodone-acetaminophen (NORCO/VICODIN) 5-325 MG tablet Take 1 tablet by mouth 4 (four) times daily as needed for moderate pain. May Take an extra 0.5 to one tablet when pain is severe.   methocarbamol (ROBAXIN) 500 MG tablet Take 1 tablet (500 mg total) by mouth every 8 (eight) hours as needed for muscle spasms.   metoprolol succinate (TOPROL-XL) 50 MG 24 hr tablet Take 1 tablet (  50 mg total) by mouth daily. Take with or immediately following a meal.   Multiple Vitamin (MULITIVITAMIN WITH MINERALS) TABS Take 1 tablet by mouth daily.   ondansetron (ZOFRAN) 4 MG tablet Take 1 tablet (4 mg total) by mouth every 6 (six) hours as needed for nausea.   potassium chloride (KLOR-CON) 10 MEQ tablet Take one tablet by mouth daily for 4 days   simvastatin (ZOCOR) 40 MG tablet TAKE ONE TABLET BY MOUTH ONCE DAILY   valsartan (DIOVAN) 160 MG tablet Take 160 mg by mouth daily.   zolpidem (AMBIEN CR) 12.5 MG CR tablet Take 12.5 mg by mouth at bedtime as needed.     Allergies:   Biotin, Lisinopril, Atenolol, Bactrim [sulfamethoxazole-trimethoprim], Cymbalta [duloxetine hcl], Lyrica [pregabalin], and Penicillins   Social History   Socioeconomic History   Marital status: Divorced    Spouse name: Not on file   Number of children: 2   Years of education: college    Highest education level: Not on file  Occupational History    Employer: RETIRED  Tobacco Use   Smoking status: Former    Packs/day: 2.50    Years: 20.00    Pack years: 50.00    Types: Cigarettes    Quit date: 06/29/1990    Years since quitting: 31.2   Smokeless tobacco: Never  Vaping Use   Vaping Use: Never used  Substance and Sexual Activity   Alcohol use: No   Drug use: No   Sexual activity: Not on file  Other Topics Concern   Not on file  Social History Narrative    Not on file   Social Determinants of Health   Financial Resource Strain: Not on file  Food Insecurity: Not on file  Transportation Needs: Not on file  Physical Activity: Not on file  Stress: Not on file  Social Connections: Not on file     Family History: The patient's family history includes Cancer in her brother and paternal aunt; Healthy in her daughter, daughter, and sister; Heart attack in her father, maternal grandfather, and paternal grandmother; Heart disease in her father; Heart failure in her maternal grandmother and mother.  ROS:   Please see the history of present illness.     All other systems reviewed and are negative.   Risk Assessment/Calculations:           Physical Exam:    VS:  BP (!) 154/68 (BP Location: Left Arm, Patient Position: Sitting, Cuff Size: Large)    Pulse 74    Ht 5\' 4"  (1.626 m)    Wt 194 lb 12.8 oz (88.4 kg)    SpO2 95%    BMI 33.44 kg/m     Wt Readings from Last 3 Encounters:  10/02/21 194 lb 12.8 oz (88.4 kg)  08/26/21 185 lb (83.9 kg)  08/15/21 185 lb (83.9 kg)     GEN:  Well nourished, well developed in no acute distress HEENT: Normal NECK: No JVD; No carotid bruits LYMPHATICS: No lymphadenopathy CARDIAC: RRR, no murmurs, rubs, gallops, bilateral lower extremity 1-2+ pitting edema RESPIRATORY:  Clear to auscultation without rales, wheezing or rhonchi  ABDOMEN: Soft, non-tender, non-distended MUSCULOSKELETAL:  No edema; No deformity  SKIN: Warm and dry NEUROLOGIC:  Alert and oriented x 3 PSYCHIATRIC:  Normal affect    EKGs/Labs/Other Studies Reviewed:    The following studies were reviewed today:  Echocardiogram 02/27/2021 IMPRESSIONS     1. Left ventricular ejection fraction, by estimation, is 55 to 60%. The  left  ventricle has normal function. The left ventricle has no regional  wall motion abnormalities. There is moderate left ventricular hypertrophy.  Left ventricular diastolic  parameters were normal.   2. Right  ventricular systolic function is normal. The right ventricular  size is normal. There is mildly elevated pulmonary artery systolic  pressure. The estimated right ventricular systolic pressure is 82.9 mmHg.   3. The mitral valve is normal in structure. Mild mitral valve  regurgitation.   4. The aortic valve is tricuspid. There is moderate calcification of the  aortic valve. Aortic valve regurgitation is mild. Mild to moderate aortic  valve stenosis. Vmax 2.6 m/s, MG 15 mmHg, AVA 1.0 cm^2, DI 0.36   5. Aortic dilatation noted. There is mild dilatation of the ascending  aorta, measuring 38 mm.   6. The inferior vena cava is dilated in size with >50% respiratory  variability, suggesting right atrial pressure of 8 mmHg.   FINDINGS   Left Ventricle: Left ventricular ejection fraction, by estimation, is 55  to 60%. The left ventricle has normal function. The left ventricle has no  regional wall motion abnormalities. The left ventricular internal cavity  size was normal in size. There is   moderate left ventricular hypertrophy. Left ventricular diastolic  parameters were normal.   Right Ventricle: The right ventricular size is normal. No increase in  right ventricular wall thickness. Right ventricular systolic function is  normal. There is mildly elevated pulmonary artery systolic pressure. The  tricuspid regurgitant velocity is 2.69   m/s, and with an assumed right atrial pressure of 8 mmHg, the estimated  right ventricular systolic pressure is 56.2 mmHg.   Left Atrium: Left atrial size was normal in size.   Right Atrium: Right atrial size was normal in size.   Pericardium: There is no evidence of pericardial effusion.   Mitral Valve: The mitral valve is normal in structure. Mild mitral valve  regurgitation.   Tricuspid Valve: The tricuspid valve is normal in structure. Tricuspid  valve regurgitation is trivial.   Aortic Valve: The aortic valve is tricuspid. There is moderate   calcification of the aortic valve. Aortic valve regurgitation is mild.  Aortic regurgitation PHT measures 455 msec. Mild to moderate aortic  stenosis is present. Aortic valve mean gradient  measures 14.0 mmHg. Aortic valve peak gradient measures 24.0 mmHg. Aortic  valve area, by VTI measures 0.97 cm.   Pulmonic Valve: The pulmonic valve was not well visualized. Pulmonic valve  regurgitation is not visualized.   Aorta: The aortic root is normal in size and structure and aortic  dilatation noted. There is mild dilatation of the ascending aorta,  measuring 38 mm.   Venous: The inferior vena cava is dilated in size with greater than 50%  respiratory variability, suggesting right atrial pressure of 8 mmHg.   IAS/Shunts: No atrial level shunt detected by color flow Doppler.   Echocardiogram 08/14/2021  IMPRESSIONS     1. Left ventricular ejection fraction, by estimation, is 55 to 60%. The  left ventricle has normal function. The left ventricle has no regional  wall motion abnormalities. Left ventricular diastolic parameters are  consistent with Grade II diastolic  dysfunction (pseudonormalization).   2. Right ventricular systolic function is normal. The right ventricular  size is normal. There is normal pulmonary artery systolic pressure. The  estimated right ventricular systolic pressure is 13.0 mmHg.   3. The mitral valve is normal in structure. Trivial mitral valve  regurgitation. No evidence of mitral stenosis.  4. 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  Aortic valve regurgitation is mild. Probably paradoxical low flow/low  gradient moderate aortic stenosis. Aortic valve area, by VTI measures 1.03  cm. Aortic valve mean gradient  measures 14.0 mmHg.   5. The inferior vena cava is normal in size with greater than 50%  respiratory variability, suggesting right atrial pressure of 3 mmHg.  CT angio chest 09/10/20 COMPARISON:  CTA chest  09/09/2019   FINDINGS: Cardiovascular: Fusiform aneurysmal dilation of the ascending thoracic aorta with a maximal diameter of 4.1 cm, minimally increased compared to 4.0 cm previously. Surgical changes of prior median sternotomy with multivessel CABG. The heart is normal in size. No pericardial effusion. Calcifications are present on the coronary arteries. The aortic valve is mildly thickened and calcified. Conventional 3 vessel arch anatomy. Atherosclerotic plaque at the origin of the left subclavian artery results in mild stenosis. Calcifications extend throughout the descending thoracic aorta.   Mediastinum/Nodes: Unremarkable CT appearance of the thyroid gland. No suspicious mediastinal or hilar adenopathy. No soft tissue mediastinal mass. The thoracic esophagus is unremarkable.   Lungs/Pleura: Small amount of fluid trapped within the minor fissure. The lungs are otherwise clear. No pleural effusion or pneumothorax.   Upper Abdomen: No acute abnormality within the visualized upper abdomen. Stable dilation of the common bile duct up to 1.4 cm. The gallbladder is surgically absent.   Musculoskeletal: No acute fracture or aggressive appearing lytic or blastic osseous lesion.   Review of the MIP images confirms the above findings.   IMPRESSION: 1. Stable to incrementally enlarged fusiform aneurysm of the ascending thoracic aorta with a maximal diameter of 4.1 cm compared to 4.0 cm previously. Recommend annual imaging followup by CTA or MRA. This recommendation follows 2010 ACCF/AHA/AATS/ACR/ASA/SCA/SCAI/SIR/STS/SVM Guidelines for the Diagnosis and Management of Patients with Thoracic Aortic Disease. Circulation. 2010; 121: M010-U725. Aortic aneurysm NOS (ICD10-I71.9); Aortic Atherosclerosis (ICD10-I70.0). 2. Thickened and calcified aortic valve suggests underlying aortic stenosis. 3. Coronary artery disease status post multivessel CABG. 4. Small amount of fluid within the  minor fissure. 5. Stable chronic dilation of the common bile duct.  EKG:  EKG is  ordered today.  The ekg ordered today demonstrates sinus rhythm with occasional premature ventricular complexes and premature atrial complexes septal infarct undetermined age 74 bpm  Recent Labs: 02/26/2021: B Natriuretic Peptide 263.1 02/27/2021: ALT 16; Hemoglobin 11.4; Platelets 195 04/11/2021: BUN 20; Creatinine, Ser 1.45; Potassium 4.7; Sodium 142  Recent Lipid Panel    Component Value Date/Time   CHOL 141 10/13/2019 0817   TRIG 128 10/13/2019 0817   HDL 56 10/13/2019 0817   CHOLHDL 2.5 10/13/2019 0817   LDLCALC 63 10/13/2019 0817    ASSESSMENT & PLAN    Atrial flutter/fibrillation-cardiac unaware.  EKG today shows sinus rhythm with occasional premature ventricular complexes and premature atrial complexes septal infarct undetermined age 15 bpm   She reports compliance with her apixaban.  Echocardiogram showed normal LVEF and she was discharged in stable condition. Continue metoprolol, apixaban Heart healthy low-sodium diet-salty 6 given Increase physical activity as tolerated   Lower extremity edema-generalized bilateral lower extremity 1-2+ pitting edema.  Reports she is about 10 pounds up.  Discussed the importance of consistent physical activity.  Reviewed heart healthy low-sodium diet. Start furosemide 20 mg x 4 days Start potassium 10 mill equivalents x4 days then stop Increase physical activity as tolerated Lower extremity support stockings Elevate lower extremities when not active BMP in 1 week Daily weights-contact  office with a weight increase of 2 pounds overnight or 5 pounds in 1 week.  Essential hypertension-BP today 154/68.  Well-controlled on. Continue metoprolol Continue valsartan 160 Heart healthy low-sodium diet-salty 6 reviewed Increase physical activity as tolerated Maintain blood pressure log  OSA-compliant with CPAP.  Following with Dr. Radford Pax.  Waking up well rested.   Denies daytime somnolence.   Continue CPAP use   Coronary artery disease-denies episodes of chest pain.  Status post CABG 1998. Continue simvastatin, metoprolol, ezetimibe Heart healthy low-sodium diet Increase physical activity as tolerated   Peripheral arterial disease-no lower extremity claudication.  Underwent aortic iliac bypass Continue simvastatin, apixaban Heart healthy low-sodium diet Increase physical activity as tolerated   Hyperlipidemia-LDL 63 10/13/2019 Continue simvastatin, ezetimibe Heart healthy low-sodium high-fiber diet Increase physical activity as tolerated Repeat fasting lipids and LFTs  Thoracic aortic aneurysm-follow-up CT 09/10/2020 showed stable aneurysm.  Details above.    Reports good blood pressure control at home. Repeat chest CT 2/24   Disposition: Follow-up with Dr. Debara Pickett or me in 1 months.        Medication Adjustments/Labs and Tests Ordered: Current medicines are reviewed at length with the patient today.  Concerns regarding medicines are outlined above.  Orders Placed This Encounter  Procedures   Basic metabolic panel   EKG 40-HKVQ   Meds ordered this encounter  Medications   furosemide (LASIX) 20 MG tablet    Sig: Take one tablet by mouth daily for 4 days    Dispense:  4 tablet    Refill:  0   potassium chloride (KLOR-CON) 10 MEQ tablet    Sig: Take one tablet by mouth daily for 4 days    Dispense:  4 tablet    Refill:  0    Patient Instructions  Medication Instructions:  Your physician has recommended you make the following change in your medication:  1.Start furosemide (Lasix) 20 mg, take one tablet by mouth daily for 4 days 2.Start potassium 20 meq, take one tablet by mouth daily for 4 days  *If you need a refill on your cardiac medications before your next appointment, please call your pharmacy*   Lab Work: BMET in 1 week If you have labs (blood work) drawn today and your tests are completely normal, you will receive your  results only by: [place] (if you have MyChart) OR A paper copy in the mail If you have any lab test that is abnormal or we need to change your treatment, we will call you to review the results.   Follow-Up: At [facility], you and your health needs are our priority.  As part of our continuing mission to provide you with exceptional heart care, we have created designated Provider Care Teams.  These Care Teams include your primary Cardiologist (physician) and Advanced Practice Providers (APPs -  Physician Assistants and Nurse Practitioners) who all work together to provide you with the care you need, when you need it.   Your next appointment:   2-3 week(s)  The format for your next appointment:   In Person  Provider:   Laurann Montana, NP or Coletta Memos, NP{  Other Instructions 1.Elevate lower extremities when not active 2.Weigh yourself daily 3.Continue your blood pressure log    Signed, Deberah Pelton, NP  10/02/2021 5:24 PM      Notice: This dictation was prepared with Dragon dictation along with smaller phrase technology. Any transcriptional errors that result from this process are unintentional and may not be corrected upon  review.  I spent 14 minutes examining this patient, reviewing medications, and using patient centered shared decision making involving her cardiac care.  Prior to her visit I spent greater than 20 minutes reviewing her past medical history,  medications, and prior cardiac tests.

## 2021-10-02 NOTE — Patient Instructions (Signed)
Medication Instructions:  ?Your physician has recommended you make the following change in your medication:  ?1.Start furosemide (Lasix) 20 mg, take one tablet by mouth daily for 4 days ?2.Start potassium 20 meq, take one tablet by mouth daily for 4 days  ?*If you need a refill on your cardiac medications before your next appointment, please call your pharmacy* ? ? ?Lab Work: ?BMET in 1 week ?If you have labs (blood work) drawn today and your tests are completely normal, you will receive your results only by: ?MyChart Message (if you have MyChart) OR ?A paper copy in the mail ?If you have any lab test that is abnormal or we need to change your treatment, we will call you to review the results. ? ? ?Follow-Up: ?At [facility], you and your health needs are our priority.  As part of our continuing mission to provide you with exceptional heart care, we have created designated Provider Care Teams.  These Care Teams include your primary Cardiologist (physician) and Advanced Practice Providers (APPs -  Physician Assistants and Nurse Practitioners) who all work together to provide you with the care you need, when you need it. ? ? ?Your next appointment:   ?2-3 week(s) ? ?The format for your next appointment:   ?In Person ? ?Provider:   ?Laurann Montana, NP or Coletta Memos, NP{ ? ?Other Instructions ?1.Elevate lower extremities when not active ?2.Weigh yourself daily ?3.Continue your blood pressure log  ?

## 2021-10-09 DIAGNOSIS — I712 Thoracic aortic aneurysm, without rupture, unspecified: Secondary | ICD-10-CM | POA: Diagnosis not present

## 2021-10-09 DIAGNOSIS — E782 Mixed hyperlipidemia: Secondary | ICD-10-CM | POA: Diagnosis not present

## 2021-10-09 DIAGNOSIS — R600 Localized edema: Secondary | ICD-10-CM | POA: Diagnosis not present

## 2021-10-09 DIAGNOSIS — G4733 Obstructive sleep apnea (adult) (pediatric): Secondary | ICD-10-CM | POA: Diagnosis not present

## 2021-10-09 DIAGNOSIS — Z951 Presence of aortocoronary bypass graft: Secondary | ICD-10-CM | POA: Diagnosis not present

## 2021-10-09 DIAGNOSIS — I4891 Unspecified atrial fibrillation: Secondary | ICD-10-CM | POA: Diagnosis not present

## 2021-10-09 DIAGNOSIS — I10 Essential (primary) hypertension: Secondary | ICD-10-CM | POA: Diagnosis not present

## 2021-10-09 DIAGNOSIS — I739 Peripheral vascular disease, unspecified: Secondary | ICD-10-CM | POA: Diagnosis not present

## 2021-10-10 LAB — BASIC METABOLIC PANEL
BUN/Creatinine Ratio: 15 (ref 12–28)
BUN: 23 mg/dL (ref 8–27)
CO2: 25 mmol/L (ref 20–29)
Calcium: 9.5 mg/dL (ref 8.7–10.3)
Chloride: 102 mmol/L (ref 96–106)
Creatinine, Ser: 1.49 mg/dL — ABNORMAL HIGH (ref 0.57–1.00)
Glucose: 88 mg/dL (ref 70–99)
Potassium: 4.6 mmol/L (ref 3.5–5.2)
Sodium: 143 mmol/L (ref 134–144)
eGFR: 35 mL/min/{1.73_m2} — ABNORMAL LOW (ref >59)

## 2021-10-14 NOTE — Progress Notes (Unsigned)
Office Visit    Patient Name: Kara Bush Date of Encounter: 10/14/2021  Primary Care Provider:  Aretta Nip, MD Primary Cardiologist:  Pixie Casino, MD  Chief Complaint    1 month follow-up for CHF  History of Present Illness    Kara Bush is a 81 y.o. 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 Echo completed 1/23 with EF 55-60%, moderate AAS grade 2 DD.  She was diagnosed with A-fib/RVR on 7/26 with heart rates in the 110s-130s.  She was started on Eliquis and metoprolol succinate.  Echo completed 02/27/21 with EF of 55-60%, moderate LVH, mild mitral regurgitation.  She was seen in follow-up on 8/22 by Odie Sera, NP.  During visit she reported dietary indiscretions and noncompliance with CPAP for over 13 years.Her blood pressures are ranging from 150-180/80 at home.  Sleep evaluation was ordered and valsartan was increased.  She resumed CPAP usage after sleep study on 11/22.  She was last seen in clinic on 10/02/2021 and was up 10 pounds with lower extremity swelling.  She revealed dietary indiscretions salt.  She was instructed to take Lasix 20 mg x 4 days and potassium x4 days and stop.  Sincelast being seen in our clinic Ms. Parcher reports doing ***.  '@He'$ /She@ denies chest pain, palpitations, dyspnea, PND, orthopnea, nausea, vomiting, dizziness, syncope, edema, weight gain, or early satiety.     Past Medical History    Past Medical History:  Diagnosis Date   Abdominal aortic aneurysm    Anemia, improved 01/11/2013   Basal cell carcinoma    CAD (coronary artery disease)    hx CABG 1998, last cath 2007 with PCI and stenting to the proximal segment of the vein graft to the diagonal branch. DES Taxus was placed   Cervicalgia    Chest wall pain    Chronic fatigue fibromyalgia syndrome    Chronic pain syndrome    Coronary artery disease    Depression    Fibromyalgia     Fibromyalgia    History of nuclear stress test 08/07/2011   lexiscan; no evidence of ischemia or infarct; low risk    Hyperlipidemia    Hypertension    Hypertension    Obstructive sleep apnea    OSA (obstructive sleep apnea)    PAD (peripheral artery disease) (HCC)    Pes anserine bursitis    Pulmonary embolism ([name]) 01/11/2013   S/P resection of aortic aneurysm    Trochanteric bursitis    Ulcerative colitis ([place])    Past Surgical History:  Procedure Laterality Date   AORTOBIEXTERNAL ILIAC BYPASS  1991   CATARACT EXTRACTION Left 09/2019   CHOLECYSTECTOMY  1997   CORONARY ANGIOPLASTY WITH STENT PLACEMENT  2006   to LAD, ATRETIC LIMA AT THAT TIME ALSO   CORONARY ANGIOPLASTY WITH STENT PLACEMENT  2007   STENT TO PROX VG-DIAG, OTHER STENTS PATENT   CORONARY ARTERY BYPASS GRAFT  1998   LIMA-LAD;VG-DIAG & RCA   CORONARY STENT PLACEMENT  2005   TO LAD & LCX-STENTS,   HERNIA REPAIR     TUBAL LIGATION     BTL    Allergies  Allergies  Allergen Reactions   Biotin Hives   Lisinopril Cough   Atenolol Other (See Comments)    colitis   Bactrim [Sulfamethoxazole-Trimethoprim] Nausea And Vomiting   Cymbalta [Duloxetine Hcl] Other (See Comments)    Mad pt  feel spacey   Lyrica [Pregabalin] Other (See Comments)    Made pt feel spacey   Penicillins Rash    Home Medications    Current Outpatient Medications  Medication Sig Dispense Refill   amLODipine (NORVASC) 5 MG tablet Take 1 tablet by mouth daily at 12 noon.     b complex vitamins capsule Take 1 capsule by mouth daily.     citalopram (CELEXA) 20 MG tablet Take 20 mg by mouth daily.      diclofenac sodium (VOLTAREN) 1 % GEL Apply 1 application topically daily as needed (joint pain).     ELIQUIS 5 MG TABS tablet TAKE ONE TABLET BY MOUTH TWICE DAILY 180 tablet 1   ezetimibe (ZETIA) 10 MG tablet TAKE ONE TABLET BY MOUTH ONCE DAILY 90 tablet 1   furosemide (LASIX) 20 MG tablet Take one tablet by mouth daily for 4 days 4 tablet 0    HYDROcodone-acetaminophen (NORCO/VICODIN) 5-325 MG tablet Take 1 tablet by mouth 4 (four) times daily as needed for moderate pain. May Take an extra 0.5 to one tablet when pain is severe. 140 tablet 0   methocarbamol (ROBAXIN) 500 MG tablet Take 1 tablet (500 mg total) by mouth every 8 (eight) hours as needed for muscle spasms. 30 tablet 1   metoprolol succinate (TOPROL-XL) 50 MG 24 hr tablet Take 1 tablet (50 mg total) by mouth daily. Take with or immediately following a meal. 30 tablet 0   Multiple Vitamin (MULITIVITAMIN WITH MINERALS) TABS Take 1 tablet by mouth daily.     ondansetron (ZOFRAN) 4 MG tablet Take 1 tablet (4 mg total) by mouth every 6 (six) hours as needed for nausea. 20 tablet 0   potassium chloride (KLOR-CON) 10 MEQ tablet Take one tablet by mouth daily for 4 days 4 tablet 0   simvastatin (ZOCOR) 40 MG tablet TAKE ONE TABLET BY MOUTH ONCE DAILY 90 tablet 1   valsartan (DIOVAN) 160 MG tablet Take 160 mg by mouth daily.     zolpidem (AMBIEN CR) 12.5 MG CR tablet Take 12.5 mg by mouth at bedtime as needed.     No current facility-administered medications for this visit.     Review of Systems  Please see the history of present illness.    (+)*** (+)***  All other systems reviewed and are otherwise negative except as noted above.    Physical Exam    Wt Readings from Last 3 Encounters:  10/02/21 194 lb 12.8 oz (88.4 kg)  08/26/21 185 lb (83.9 kg)  08/15/21 185 lb (83.9 kg)    OJ:JKKXF were no vitals filed for this visit.   GEN: Well nourished, well developed, in no acute distress. Neck: Supple, no JVD, carotid bruits, or masses. Cardiac: S1,S2,RRR, no murmurs, rubs, or gallops. No clubbing, cyanosis, edema.   Radials/PT 2+ and equal bilaterally.  Respiratory:  Respirations regular and unlabored, clear to auscultation bilaterally. MS: no deformity or atrophy. Skin: warm and dry, no rash. Neuro:  Strength and sensation are intact. Psych: Normal  affect.  Accessory Clinical Findings    ECG personally reviewed by me today - ***  with rate of ***- no acute changes.  Risk Assessment/Calculations:    CHA2DS2-VASc Score = 5  {Confirm score is correct.  If not, click here to update score.  REFRESH note.  :1} This indicates a 7.2% annual risk of stroke. The patient's score is based upon: CHF History: 0 HTN History: 1 Diabetes History: 0 Stroke History: 0 Vascular Disease History:  1 Age Score: 2 Gender Score: 1   {This patient has a significant risk of stroke if diagnosed with atrial fibrillation.  Please consider VKA or DOAC agent for anticoagulation if the bleeding risk is acceptable.   You can also use the SmartPhrase .[place] for documentation.   :[id number]}        Lab Results  Component Value Date   WBC 8.4 02/27/2021   HGB 11.4 (L) 02/27/2021   HCT 37.0 02/27/2021   MCV 83.3 02/27/2021   PLT 195 02/27/2021   Lab Results  Component Value Date   CREATININE 1.49 (H) 10/09/2021   BUN 23 10/09/2021   NA 143 10/09/2021   K 4.6 10/09/2021   CL 102 10/09/2021   CO2 25 10/09/2021   Lab Results  Component Value Date   ALT 16 02/27/2021   AST 17 02/27/2021   ALKPHOS 37 (L) 02/27/2021   BILITOT 0.6 02/27/2021   Lab Results  Component Value Date   CHOL 141 10/13/2019   HDL 56 10/13/2019   LDLCALC 63 10/13/2019   TRIG 128 10/13/2019   CHOLHDL 2.5 10/13/2019    No results found for: HGBA1C  Assessment & Plan    1.  Atrial fibrillation/atrial flutter: -Continue apixaban 5 mg twice daily  2.  HFpEF: -Continue GDMT with Toprol-XL, valsartan, may optimize with spironolactone or SGLT2, ARN I -Patient appears***today, continue as needed Lasix, potassium chloride  3.  Essential hypertension: -Blood pressure well controlled today at*** -Continue Norvasc 5 mg, valsartan 160 mg, Toprol 50 mg, -Low-sodium heart healthy diet  4.  Coronary artery disease -s/p CABG 1998 Currently denies chest pain or shortness  of breath -GDMT with Toprol, Zocor, Zetia -Continue to advance activity as tolerated -Low-sodium heart healthy diet  5.  Hyperlipidemia; -LDL: 63 3/21, triglycerides 128, HDL 56 -LFTs WNL 3/21     Disposition: Follow-up with Dr./APP  in *** month/year  {Are you ordering a CV Procedure (e.g. stress test, cath, DCCV, TEE, etc)?   Press F2        :[id number]}   Medication Adjustments/Labs and Tests Ordered: Current medicines are reviewed at length with the patient today.  Concerns regarding medicines are outlined above.  Tests Ordered: No orders of the defined types were placed in this encounter.  Medication Changes: No orders of the defined types were placed in this encounter.    Mable Fill, Marissa Nestle, NP 10/14/2021, 11:29 AM

## 2021-10-16 ENCOUNTER — Encounter (HOSPITAL_BASED_OUTPATIENT_CLINIC_OR_DEPARTMENT_OTHER): Payer: Self-pay | Admitting: Nurse Practitioner

## 2021-10-16 ENCOUNTER — Ambulatory Visit (HOSPITAL_BASED_OUTPATIENT_CLINIC_OR_DEPARTMENT_OTHER): Payer: PPO | Admitting: Nurse Practitioner

## 2021-10-16 ENCOUNTER — Other Ambulatory Visit: Payer: Self-pay

## 2021-10-16 VITALS — BP 166/68 | HR 60 | Ht 64.0 in | Wt 192.6 lb

## 2021-10-16 DIAGNOSIS — E782 Mixed hyperlipidemia: Secondary | ICD-10-CM | POA: Diagnosis not present

## 2021-10-16 DIAGNOSIS — R600 Localized edema: Secondary | ICD-10-CM

## 2021-10-16 DIAGNOSIS — I10 Essential (primary) hypertension: Secondary | ICD-10-CM

## 2021-10-16 DIAGNOSIS — I4891 Unspecified atrial fibrillation: Secondary | ICD-10-CM | POA: Diagnosis not present

## 2021-10-16 DIAGNOSIS — G4733 Obstructive sleep apnea (adult) (pediatric): Secondary | ICD-10-CM | POA: Diagnosis not present

## 2021-10-16 MED ORDER — POTASSIUM CHLORIDE ER 20 MEQ PO TBCR: 20.0000 meq | EXTENDED_RELEASE_TABLET | Freq: Every day | ORAL | 0 refills | Status: DC

## 2021-10-16 MED ORDER — FUROSEMIDE 40 MG PO TABS: 40.0000 mg | ORAL_TABLET | Freq: Every day | ORAL | 0 refills | Status: DC

## 2021-10-16 MED ORDER — ENTRESTO 49-51 MG PO TABS: 1.0000 | ORAL_TABLET | Freq: Two times a day (BID) | ORAL | 3 refills | Status: DC

## 2021-10-16 NOTE — Patient Instructions (Addendum)
Medication Instructions:  ?Your physician has recommended you make the following change in your medication:   ? ?START Furosemide '40mg'$  daily x 4 days ? ?START Potassium 20 mEq daily x 4 days ? ?___________________________ ? ?STOP Valsartan ? ?START Entresto 49-'51mg'$  one tablet twice daily ? ?*If you need a refill on your cardiac medications before your next appointment, please call your pharmacy* ? ? ?Lab Work: ?Your physician recommends that you return for lab work in one week for BMP, BNP  ? ?Please return for Lab work. You may come to the...  ? ?[facility] (3rd floor) ?[address] [place], [place]  ?Open: 8am-Noon and 1pm-4:30pm  ? ?[place] [facility] ?[place]  ? ?[employer]- Any location ? ?**no appointments needed**  ? ?If you have labs (blood work) drawn today and your tests are completely normal, you will receive your results only by: ?MyChart Message (if you have MyChart) OR ?A paper copy in the mail ?If you have any lab test that is abnormal or we need to change your treatment, we will call you to review the results. ? ? ?Testing/Procedures: ?None ordered today.  ? ?Follow-Up: ?At [facility], you and your health needs are our priority.  As part of our continuing mission to provide you with exceptional heart care, we have created designated Provider Care Teams.  These Care Teams include your primary Cardiologist (physician) and Advanced Practice Providers (APPs -  Physician Assistants and Nurse Practitioners) who all work together to provide you with the care you need, when you need it. ? ?We recommend signing up for the patient portal called "MyChart".  Sign up information is provided on this After Visit Summary.  MyChart is used to connect with patients for Virtual Visits (Telemedicine).  Patients are able to view lab/test results, encounter notes, upcoming appointments, etc.  Non-urgent messages can be sent to your provider as well.    ?To learn more about what you can do with MyChart, go to [email or web address].   ? ?Your next appointment:   ?3 week(s) ? ?The format for your next appointment:   ?In Person ? ?Provider:   ?Laurann Montana, NP  ? ? ?Other Instructions ? ?Recommend drinking no more than 2 liters of fluid per day.  ? ?Follow low salt diet. ? ?Heart Failure, Diagnosis ?Heart failure means that your heart is not able to pump blood in the right way. This makes it hard for your body to work well. Heart failure is usually a long-term (chronic) condition. You must take good care of yourself and follow your treatment plan from your doctor. ?Different stages of heart failure have different treatment plans. The stages are: ?Stage A: At risk for heart failure. ?Stage B: Pre-heart failure. ?Stage C: Symptomatic heart failure. ?Stage D: Advanced heart failure. ?What are the causes? ?High blood pressure. ?Buildup of cholesterol and fat in the arteries. ?Heart attack. This injures the heart muscle. ?Heart valves that do not open and close properly. ?Damage of the heart muscle. This is also called cardiomyopathy. ?Infection of the heart muscle. This is also called myocarditis. ?Lung disease. ?What increases the risk? ?Getting older. The risk of heart failure goes up as a person ages. ?Being overweight. ?Using tobacco or nicotine products. ?Abusing alcohol or drugs. ?Having taken medicines that can damage the heart. ?Having any of these conditions: ?Diabetes. ?Abnormal heart rhythms. ?Thyroid problems. ?Low blood counts (anemia). ?Having a family history of heart failure. ?What are the signs  or symptoms? ?Shortness of breath. ?Coughing. ?Swelling of the feet, ankles, legs, or belly. ?Losing or gaining weight for no reason. ?Trouble breathing. ?Waking from sleep because of the need to sit up and get more air. ?Fast heartbeat. ?Other symptoms may include: ?Being very tired. ?Feeling dizzy, or feeling like you may pass out (faint). ?Having no desire  to eat. ?Feeling like you may vomit (nauseous). ?Peeing (urinating) more at night. ?Feeling confused. ?How is this treated? ?This condition may be treated with: ?Medicines. These can be given to treat blood pressure and to make the heart muscles stronger. ?Changes in your daily life. These may include: ?Eating a healthy diet. ?Staying at a healthy body weight. ?Quitting tobacco, alcohol, and drug use. ?Doing exercises. ?Participating in a cardiac rehabilitation program. This program helps you improve your health through exercise, education, and counseling. ?Surgery. Surgery can be done to open blocked valves or to put devices in the heart, such as pacemakers. ?A donor heart (heart transplant). You will receive a healthy heart from a donor. ?Follow these instructions at home: ?Treat other conditions as told by your doctor. These may include high blood pressure, diabetes, thyroid disease, or abnormal heart rhythms. ?Learn as much as you can about heart failure. ?Get support as you need it. ?Keep all follow-up visits. ?Where to find more information ?American Heart Association: www.heart.org ?Centers for Disease Control and Prevention: http://www.wolf.info/ ?[employer] on Aging: http://kim-miller.com/ ?Summary ?Heart failure means that your heart is not able to pump blood in the right way. ?This condition is often caused by high blood pressure, heart attack, or damage of the heart muscle. ?Symptoms of this condition include shortness of breath and swelling of the feet, ankles, legs, or belly. You may also feel very tired or feel like you may vomit. ?You may be treated with medicines, surgery, or changes in your daily life. ?Treat other health conditions as told by your doctor. ?This information is not intended to replace advice given to you by your health care provider. Make sure you discuss any questions you have with your health care provider. ?Document Revised: 01/17/2021 Document Reviewed: 02/11/2020 ?Elsevier Patient  Education ? [place]. ? ?

## 2021-10-21 ENCOUNTER — Other Ambulatory Visit: Payer: Self-pay

## 2021-10-21 ENCOUNTER — Encounter: Payer: Self-pay | Admitting: Registered Nurse

## 2021-10-21 ENCOUNTER — Encounter: Payer: PPO | Attending: Physical Medicine and Rehabilitation | Admitting: Registered Nurse

## 2021-10-21 ENCOUNTER — Encounter (HOSPITAL_BASED_OUTPATIENT_CLINIC_OR_DEPARTMENT_OTHER): Payer: Self-pay

## 2021-10-21 VITALS — BP 142/80 | HR 82 | Ht 64.0 in | Wt 185.6 lb

## 2021-10-21 DIAGNOSIS — M7061 Trochanteric bursitis, right hip: Secondary | ICD-10-CM | POA: Insufficient documentation

## 2021-10-21 DIAGNOSIS — M7062 Trochanteric bursitis, left hip: Secondary | ICD-10-CM | POA: Diagnosis not present

## 2021-10-21 DIAGNOSIS — M545 Low back pain, unspecified: Secondary | ICD-10-CM | POA: Diagnosis not present

## 2021-10-21 DIAGNOSIS — M25561 Pain in right knee: Secondary | ICD-10-CM | POA: Diagnosis not present

## 2021-10-21 DIAGNOSIS — Z5181 Encounter for therapeutic drug level monitoring: Secondary | ICD-10-CM | POA: Diagnosis not present

## 2021-10-21 DIAGNOSIS — G8929 Other chronic pain: Secondary | ICD-10-CM | POA: Diagnosis not present

## 2021-10-21 DIAGNOSIS — Z79891 Long term (current) use of opiate analgesic: Secondary | ICD-10-CM | POA: Insufficient documentation

## 2021-10-21 DIAGNOSIS — G894 Chronic pain syndrome: Secondary | ICD-10-CM | POA: Diagnosis not present

## 2021-10-21 DIAGNOSIS — M797 Fibromyalgia: Secondary | ICD-10-CM | POA: Insufficient documentation

## 2021-10-21 MED ORDER — HYDROCODONE-ACETAMINOPHEN 5-325 MG PO TABS: 1.0000 | ORAL_TABLET | Freq: Four times a day (QID) | ORAL | 0 refills | Status: DC | PRN

## 2021-10-21 NOTE — Progress Notes (Signed)
? ?Subjective:  ? ? Patient ID: Kara Bush, female    DOB: 1940/08/24, 81 y.o.   MRN: [id number] ? ?HPI: Kara Bush is a 81 y.o. female who returns for follow up appointment for chronic pain and medication refill. She states her pain is located in her mid- lower back pain and right knee pain. She rates her pain 5. Her current exercise regime is walking short distances  and performing stretching exercises. ? ?Kara Bush Morphine equivalent is 23.33 MME.   UDS ordered today.  ?  ? ?Pain Inventory ?Average Pain 5 ?Pain Right Now 5 ?My pain is aching ? ?In the last 24 hours, has pain interfered with the following? ?General activity 2 ?Relation with others 0 ?Enjoyment of life 3 ?What TIME of day is your pain at its worst? morning  ?Sleep (in general) Good ? ?Pain is worse with: walking, bending, and some activites ?Pain improves with: rest, pacing activities, and medication ?Relief from Meds: 6 ? ?Family History  ?Problem Relation Age of Onset  ? Heart disease Father   ? Heart attack Father   ?     @ 37  ? Cancer Paternal Aunt   ?     BREAST  ? Heart failure Mother   ?     at 70  ? Healthy Sister   ? Cancer Brother   ?     pancreatic   ? Heart failure Maternal Grandmother   ? Heart attack Maternal Grandfather   ? Heart attack Paternal Grandmother   ?     at 70  ? Healthy Daughter   ? Healthy Daughter   ? ?Social History  ? ?Socioeconomic History  ? Marital status: Divorced  ?  Spouse name: Not on file  ? Number of children: 2  ? Years of education: college   ? Highest education level: Not on file  ?Occupational History  ?  Employer: RETIRED  ?Tobacco Use  ? Smoking status: Former  ?  Packs/day: 2.50  ?  Years: 20.00  ?  Pack years: 50.00  ?  Types: Cigarettes  ?  Quit date: 06/29/1990  ?  Years since quitting: 31.3  ? Smokeless tobacco: Never  ?Vaping Use  ? Vaping Use: Never used  ?Substance and Sexual Activity  ? Alcohol use: No  ? Drug use: No  ? Sexual activity: Not on file  ?Other Topics Concern  ?  Not on file  ?Social History Narrative  ? Not on file  ? ?Social Determinants of Health  ? ?Financial Resource Strain: Not on file  ?Food Insecurity: Not on file  ?Transportation Needs: Not on file  ?Physical Activity: Not on file  ?Stress: Not on file  ?Social Connections: Not on file  ? ?Past Surgical History:  ?Procedure Laterality Date  ? [place]  ? CATARACT EXTRACTION Left 09/2019  ? CHOLECYSTECTOMY  1997  ? CORONARY ANGIOPLASTY WITH STENT PLACEMENT  2006  ? to LAD, ATRETIC LIMA AT THAT TIME ALSO  ? CORONARY ANGIOPLASTY WITH STENT PLACEMENT  2007  ? STENT TO PROX VG-DIAG, OTHER STENTS PATENT  ? CORONARY ARTERY BYPASS GRAFT  1998  ? LIMA-LAD;VG-DIAG & RCA  ? CORONARY STENT PLACEMENT  2005  ? TO LAD & LCX-STENTS,  ? HERNIA REPAIR    ? TUBAL LIGATION    ? BTL  ? ?Past Surgical History:  ?Procedure Laterality Date  ? [place]  ? CATARACT EXTRACTION Left 09/2019  ?  CHOLECYSTECTOMY  1997  ? CORONARY ANGIOPLASTY WITH STENT PLACEMENT  2006  ? to LAD, ATRETIC LIMA AT THAT TIME ALSO  ? CORONARY ANGIOPLASTY WITH STENT PLACEMENT  2007  ? STENT TO PROX VG-DIAG, OTHER STENTS PATENT  ? CORONARY ARTERY BYPASS GRAFT  1998  ? LIMA-LAD;VG-DIAG & RCA  ? CORONARY STENT PLACEMENT  2005  ? TO LAD & LCX-STENTS,  ? HERNIA REPAIR    ? TUBAL LIGATION    ? BTL  ? ?Past Medical History:  ?Diagnosis Date  ? Abdominal aortic aneurysm   ? Anemia, improved 01/11/2013  ? Basal cell carcinoma   ? CAD (coronary artery disease)   ? hx CABG 1998, last cath 2007 with PCI and stenting to the proximal segment of the vein graft to the diagonal branch. DES Taxus was placed  ? Cervicalgia   ? Chest wall pain   ? Chronic fatigue fibromyalgia syndrome   ? Chronic pain syndrome   ? Coronary artery disease   ? Depression   ? Fibromyalgia   ? Fibromyalgia   ? History of nuclear stress test 08/07/2011  ? lexiscan; no evidence of ischemia or infarct; low risk   ? Hyperlipidemia   ? Hypertension   ? Hypertension    ? Obstructive sleep apnea   ? OSA (obstructive sleep apnea)   ? PAD (peripheral artery disease) ([place])   ? Pes anserine bursitis   ? Pulmonary embolism ([place]) 01/11/2013  ? S/P resection of aortic aneurysm   ? Trochanteric bursitis   ? Ulcerative colitis ([place])   ? ?BP (!) 142/80   Pulse 82   Ht '5\' 4"'$  (1.626 m)   Wt 185 lb 9.6 oz (84.2 kg)   SpO2 96%   BMI 31.86 kg/m?  ? ?Opioid Risk Score:   ?Fall Risk Score:  `1 ? ?Depression screen PHQ 2/9 ? ?Depression screen [facility] 2/9 10/21/2021 06/24/2021 04/23/2021 06/04/2020 02/02/2020 08/11/2019 04/13/2018  ?Decreased Interest 0 0 0 0 0 0 0  ?Down, Depressed, Hopeless 0 0 0 0 0 0 0  ?PHQ - 2 Score 0 0 0 0 0 0 0  ?Tired, decreased energy - - - - - - -  ?Change in appetite - - - - - - -  ?Feeling bad or failure about yourself  - - - - - - -  ?Trouble concentrating - - - - - - -  ?Moving slowly or fidgety/restless - - - - - - -  ?Suicidal thoughts - - - - - - -  ?Some recent data might be hidden  ?  ? ?Review of Systems  ?Constitutional: Negative.   ?HENT: Negative.    ?Eyes: Negative.   ?Respiratory: Negative.    ?Cardiovascular: Negative.   ?Gastrointestinal: Negative.   ?Endocrine: Negative.   ?Genitourinary: Negative.   ?Musculoskeletal:  Positive for joint swelling.  ?Skin: Negative.   ?Allergic/Immunologic: Negative.   ?Neurological: Negative.   ?Hematological: Negative.   ?Psychiatric/Behavioral: Negative.    ? ?   ?Objective:  ? Physical Exam ?Vitals and nursing note reviewed.  ?Constitutional:   ?   Appearance: Normal appearance.  ?Cardiovascular:  ?   Rate and Rhythm: Normal rate and regular rhythm.  ?   Pulses: Normal pulses.  ?   Heart sounds: Normal heart sounds.  ?Pulmonary:  ?   Effort: Pulmonary effort is normal.  ?   Breath sounds: Normal breath sounds.  ?Musculoskeletal:  ?   Cervical back: Normal range of motion and neck supple.  ?  Comments: Normal Muscle Bulk and Muscle Testing Reveals:  ?Upper Extremities: Decreased ROM 90 Degrees and Muscle Strength  5/5 ?Thoracic Paraspinal Tenderness: T-4-T-7 ?Bilateral Greater Trochanter Tenderness ? Lower Extremities: Full ROM and Muscle Strength 5/5 ?Arises from Table Slowly ?Narrow Based  Gait  ?   ?Skin: ?   General: Skin is warm and dry.  ?Neurological:  ?   Mental Status: She is alert and oriented to person, place, and time.  ?Psychiatric:     ?   Mood and Affect: Mood normal.     ?   Behavior: Behavior normal.  ? ? ? ? ?   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 10/21/2021 ?2. Fibromyalgia: Continue  Home Exercise Program. Continue to Monitor.10/21/2021 ?3. Bilateral intermittent hand numbness: Carpal tunnel syndrome versus C6 radiculopathy mild: No complaints today. Continue to Monitor. 10/21/2021 ?4. Chronic Midline Low Back Pain/ LBP: Refilled: Norco 5/'325mg'$  # 140 pills--use one pill every 6 hours as needed for pain. A second prescription was e-scribe for the following month 10/21/2021. ?We will continue the opioid monitoring program, this consists of regular clinic visits, examinations, urine drug screen, pill counts as well as use of [place] Controlled Substance Reporting system. A 12 month History has been reviewed on the [place] Controlled Substance Reporting System on 10/21/2021.Marland Kitchen ?5. Muscle Spasm: Continue current medication regimen with  Robaxin. 10/21/2021 ?6.Bilateral  Greater Trochanteric Bursitis:Continue to Alternate with heat and ice therapy. Continue current medication regime. 10/21/2021 ? 7. Left Foot pain: No complaints today.Continue with HEP as Tolerated. Continue to Monitor. 10/21/2021 ?8. Right Lateral Epicondylitis Pain: No complaints Today. Continue HEP as Tolerated. Continue to Monitor. 10/21/2021 ?9. Right Shoulder Tendonitis:  No complaints today. Continue Current Medication Regimen. Continue to Alternate Ice and Heat Therapy. 10/21/2021 ?10.Right  Chronic Knee Pain: Continue HEP as Tolerated. Continue to Monitor. 10/21/2021 ?  ?F/U in 2 months ?  ? ?

## 2021-10-23 DIAGNOSIS — R600 Localized edema: Secondary | ICD-10-CM | POA: Diagnosis not present

## 2021-10-24 ENCOUNTER — Encounter: Payer: Self-pay | Admitting: Cardiology

## 2021-10-24 ENCOUNTER — Telehealth (INDEPENDENT_AMBULATORY_CARE_PROVIDER_SITE_OTHER): Payer: PPO | Admitting: Cardiology

## 2021-10-24 ENCOUNTER — Other Ambulatory Visit: Payer: Self-pay

## 2021-10-24 VITALS — Ht 64.0 in | Wt 188.0 lb

## 2021-10-24 DIAGNOSIS — G4733 Obstructive sleep apnea (adult) (pediatric): Secondary | ICD-10-CM

## 2021-10-24 DIAGNOSIS — I10 Essential (primary) hypertension: Secondary | ICD-10-CM

## 2021-10-24 LAB — BRAIN NATRIURETIC PEPTIDE: BNP: 96.8 pg/mL (ref 0.0–100.0)

## 2021-10-24 LAB — BASIC METABOLIC PANEL
BUN/Creatinine Ratio: 16 (ref 12–28)
BUN: 25 mg/dL (ref 8–27)
CO2: 25 mmol/L (ref 20–29)
Calcium: 9.7 mg/dL (ref 8.7–10.3)
Chloride: 101 mmol/L (ref 96–106)
Creatinine, Ser: 1.52 mg/dL — ABNORMAL HIGH (ref 0.57–1.00)
Glucose: 107 mg/dL — ABNORMAL HIGH (ref 70–99)
Potassium: 4.6 mmol/L (ref 3.5–5.2)
Sodium: 141 mmol/L (ref 134–144)
eGFR: 34 mL/min/{1.73_m2} — ABNORMAL LOW (ref >59)

## 2021-10-24 NOTE — Patient Instructions (Signed)
Medication Instructions:  ?Your physician recommends that you continue on your current medications as directed. Please refer to the Current Medication list given to you today. ? ?*If you need a refill on your cardiac medications before your next appointment, please call your pharmacy* ? ?Follow-Up: ?At [facility], you and your health needs are our priority.  As part of our continuing mission to provide you with exceptional heart care, we have created designated Provider Care Teams.  These Care Teams include your primary Cardiologist (physician) and Advanced Practice Providers (APPs -  Physician Assistants and Nurse Practitioners) who all work together to provide you with the care you need, when you need it.  ? ?Your next appointment:   ?1 year(s) ? ?The format for your next appointment:   ?In Person ? ?Provider:   ?Fransico Him, MD ? ? ?Other Instructions ?Dr. Radford Pax is changing you to auto CPAP from 4 to 15cm H2O. ?Gae Bon is going to get you an appointment with your DME to go over functioning of your machine.   ?

## 2021-10-24 NOTE — Progress Notes (Signed)
? ?Virtual Visit via Video Note  ? ?This visit type was conducted due to national recommendations for restrictions regarding the COVID-19 Pandemic (e.g. social distancing) in an effort to limit this patient's exposure and mitigate transmission in our community.  Due to her co-morbid illnesses, this patient is at least at moderate risk for complications without adequate follow up.  This format is felt to be most appropriate for this patient at this time.  All issues noted in this document were discussed and addressed.  A limited physical exam was performed with this format.  Please refer to the patient's chart for her consent to telehealth for [facility]. ? ?   ?Date:  10/24/2021  ? ?ID:  Kara Bush, DOB 09/16/1940, MRN [id number] ?The patient was identified using 2 identifiers. ? ?Patient Location: Home ?Provider Location: Home Office ? ? ?PCP:  Rankins, Bill Salinas, MD ?  ?[facility] Providers ?Cardiologist:  Pixie Casino, MD    ? ?Evaluation Performed:  Follow-Up Visit ? ?Chief Complaint:  OSA ? ?History of Present Illness:   ? ?Kara Bush is a 81 y.o. female with a history of abdominal aortic aneurysm, CAD status post CABG, fibromyalgia, hypertension and OSA initially on CPAP but has not worn her CPAP in years. She only wore it for about 3 months at that time and stopped because it was too uncomfortable and she started dating someone.   Her initial sleep evaluation was over 15 years ago.  She was seen in cardiology clinic and due to her history of OSA and not being on CPAP a repeat sleep study was performed.  She was found to have severe obstructive sleep apnea with an AHI of 63.4/h and nocturnal hypoxemia with an O2 sat nadir of 79%.  She underwent CPAP titration to 11 cm H2O but at last office visit her AHI was too high and we changed her to auto CPAP from 4 to 20 cm H2O and she is now here for follow-up. ? ?Unfortunately she was never changed to auto CPAP and is still on a set  pressure. She is doing well with her CPAP device and thinks that she has gotten used to it.  She tolerates the mask and feels the pressure is adequate.  Since going on CPAP she feels rested in the am but does take a nap in the am between 7-9am  She denies any significant mouth or nasal dryness or nasal congestion.  She does not think that he snores.   The patient does not have symptoms concerning for COVID-19 infection (fever, chills, cough, or new shortness of breath).  ? ? ?Past Medical History:  ?Diagnosis Date  ? Abdominal aortic aneurysm   ? Anemia, improved 01/11/2013  ? Basal cell carcinoma   ? CAD (coronary artery disease)   ? hx CABG 1998, last cath 2007 with PCI and stenting to the proximal segment of the vein graft to the diagonal branch. DES Taxus was placed  ? Cervicalgia   ? Chest wall pain   ? Chronic fatigue fibromyalgia syndrome   ? Chronic pain syndrome   ? Coronary artery disease   ? Depression   ? Fibromyalgia   ? Fibromyalgia   ? History of nuclear stress test 08/07/2011  ? lexiscan; no evidence of ischemia or infarct; low risk   ? Hyperlipidemia   ? Hypertension   ? Hypertension   ? Obstructive sleep apnea   ? OSA (obstructive sleep apnea)   ? PAD (peripheral artery  disease) ([place])   ? Pes anserine bursitis   ? Pulmonary embolism ([place]) 01/11/2013  ? S/P resection of aortic aneurysm   ? Trochanteric bursitis   ? Ulcerative colitis ([place])   ? ?Past Surgical History:  ?Procedure Laterality Date  ? [place]  ? CATARACT EXTRACTION Left 09/2019  ? CHOLECYSTECTOMY  1997  ? CORONARY ANGIOPLASTY WITH STENT PLACEMENT  2006  ? to LAD, ATRETIC LIMA AT THAT TIME ALSO  ? CORONARY ANGIOPLASTY WITH STENT PLACEMENT  2007  ? STENT TO PROX VG-DIAG, OTHER STENTS PATENT  ? CORONARY ARTERY BYPASS GRAFT  1998  ? LIMA-LAD;VG-DIAG & RCA  ? CORONARY STENT PLACEMENT  2005  ? TO LAD & LCX-STENTS,  ? HERNIA REPAIR    ? TUBAL LIGATION    ? BTL  ?  ? ?Current Meds  ?Medication Sig  ? amLODipine (NORVASC)  5 MG tablet Take 1 tablet by mouth daily at 12 noon.  ? b complex vitamins capsule Take 1 capsule by mouth daily.  ? citalopram (CELEXA) 20 MG tablet Take 20 mg by mouth daily.   ? diclofenac sodium (VOLTAREN) 1 % GEL Apply 1 application topically daily as needed (joint pain).  ? ELIQUIS 5 MG TABS tablet TAKE ONE TABLET BY MOUTH TWICE DAILY  ? ezetimibe (ZETIA) 10 MG tablet TAKE ONE TABLET BY MOUTH ONCE DAILY  ? furosemide (LASIX) 40 MG tablet Take 1 tablet (40 mg total) by mouth daily. Take one tablet by mouth daily for 4 days  ? HYDROcodone-acetaminophen (NORCO/VICODIN) 5-325 MG tablet Take 1 tablet by mouth 4 (four) times daily as needed for moderate pain. May Take an extra 0.5 to one tablet when pain is severe.  ? methocarbamol (ROBAXIN) 500 MG tablet Take 1 tablet (500 mg total) by mouth every 8 (eight) hours as needed for muscle spasms.  ? metoprolol succinate (TOPROL-XL) 50 MG 24 hr tablet Take 1 tablet (50 mg total) by mouth daily. Take with or immediately following a meal.  ? Multiple Vitamin (MULITIVITAMIN WITH MINERALS) TABS Take 1 tablet by mouth daily.  ? ondansetron (ZOFRAN) 4 MG tablet Take 1 tablet (4 mg total) by mouth every 6 (six) hours as needed for nausea.  ? potassium chloride 20 MEQ TBCR Take 20 mEq by mouth daily. Take one tablet by mouth daily for 4 days  ? sacubitril-valsartan (ENTRESTO) 49-51 MG Take 1 tablet by mouth 2 (two) times daily.  ? simvastatin (ZOCOR) 40 MG tablet TAKE ONE TABLET BY MOUTH ONCE DAILY  ? zolpidem (AMBIEN CR) 12.5 MG CR tablet Take 12.5 mg by mouth at bedtime as needed.  ?  ? ?Allergies:   Biotin, Lisinopril, Atenolol, Bactrim [sulfamethoxazole-trimethoprim], Cymbalta [duloxetine hcl], Lyrica [pregabalin], and Penicillins  ? ?Social History  ? ?Tobacco Use  ? Smoking status: Former  ?  Packs/day: 2.50  ?  Years: 20.00  ?  Pack years: 50.00  ?  Types: Cigarettes  ?  Quit date: 06/29/1990  ?  Years since quitting: 31.3  ? Smokeless tobacco: Never  ?Vaping Use  ?  Vaping Use: Never used  ?Substance Use Topics  ? Alcohol use: No  ? Drug use: No  ?  ? ?Family Hx: ?The patient's family history includes Cancer in her brother and paternal aunt; Healthy in her daughter, daughter, and sister; Heart attack in her father, maternal grandfather, and paternal grandmother; Heart disease in her father; Heart failure in her maternal grandmother and mother. ? ?ROS:   ?Please see the  history of present illness.    ? ?All other systems reviewed and are negative. ? ? ?Prior CV studies:   ?The following studies were reviewed today: ? ?PAP compliance download ? ?Labs/Other Tests and Data Reviewed:   ? ?EKG:  No ECG reviewed. ? ?Recent Labs: ?02/26/2021: B Natriuretic Peptide 263.1 ?02/27/2021: ALT 16; Hemoglobin 11.4; Platelets 195 ?10/09/2021: BUN 23; Creatinine, Ser 1.49; Potassium 4.6; Sodium 143  ? ?Recent Lipid Panel ?Lab Results  ?Component Value Date/Time  ? CHOL 141 10/13/2019 08:17 AM  ? TRIG 128 10/13/2019 08:17 AM  ? HDL 56 10/13/2019 08:17 AM  ? CHOLHDL 2.5 10/13/2019 08:17 AM  ? LDLCALC 63 10/13/2019 08:17 AM  ? ? ?Wt Readings from Last 3 Encounters:  ?10/24/21 188 lb (85.3 kg)  ?10/21/21 185 lb 9.6 oz (84.2 kg)  ?10/16/21 192 lb 9.6 oz (87.4 kg)  ?  ? ?Objective:   ? ?Vital Signs:  Ht '5\' 4"'$  (1.626 m)   Wt 188 lb (85.3 kg)   BMI 32.27 kg/m?   ?Well nourished, well developed female in no acute distress. ?Well appearing, alert and conversant, regular work of breathing,  good skin color  ?Eyes- anicteric ?mouth- oral mucosa is pink  ?neuro- grossly intact ?skin- no apparent rash or lesions or cyanosis  ?ASSESSMENT & PLAN:   ? ?OSA - The patient is tolerating PAP therapy well without any problems. The PAP download performed by his DME was personally reviewed and interpreted by me today and showed an AHI of 9.2 /hr on 11 cm H2O with 75% compliance in using more than 4 hours nightly.  The patient has been using and benefiting from PAP use and will continue to benefit from therapy.  ?-she was  never changed to auto CPAP so I will get her DME to change to auto CPAP from 4 to 15cm H2O ?-She does not understand her device so I will get her an appt with her DME to go over the functioning of the machine ? ?

## 2021-10-25 ENCOUNTER — Telehealth: Payer: Self-pay | Admitting: *Deleted

## 2021-10-25 ENCOUNTER — Other Ambulatory Visit: Payer: Self-pay | Admitting: Internal Medicine

## 2021-10-25 ENCOUNTER — Other Ambulatory Visit (HOSPITAL_BASED_OUTPATIENT_CLINIC_OR_DEPARTMENT_OTHER): Payer: Self-pay | Admitting: General Practice

## 2021-10-25 DIAGNOSIS — G4733 Obstructive sleep apnea (adult) (pediatric): Secondary | ICD-10-CM

## 2021-10-25 NOTE — Telephone Encounter (Signed)
-----   Message from Antonieta Iba, RN sent at 10/24/2021 10:59 AM EDT ----- ?Per Dr. Radford Pax: ?get her an appt with her DME to go over the functioning of the machine- she does not understand it and need to be in person.  She also was never changed to auto CPAP from 4 to 15cm H2O. Get a download in 4 weeks.  ?Thanks! ? ?

## 2021-10-25 NOTE — Telephone Encounter (Signed)
Order placed to [facility] via comm message. ?

## 2021-10-25 NOTE — Telephone Encounter (Signed)
Prescription refill request for Eliquis received. ?Indication:Afib ?Last office visit:3/23 ?Scr:1.5 ?Age: 81 ?Weight:85.3 kg ? ?Prescription refilled ? ?

## 2021-10-26 LAB — TOXASSURE SELECT,+ANTIDEPR,UR

## 2021-10-30 ENCOUNTER — Telehealth: Payer: Self-pay | Admitting: *Deleted

## 2021-10-30 DIAGNOSIS — I4891 Unspecified atrial fibrillation: Secondary | ICD-10-CM | POA: Diagnosis not present

## 2021-10-30 DIAGNOSIS — G8929 Other chronic pain: Secondary | ICD-10-CM | POA: Diagnosis not present

## 2021-10-30 DIAGNOSIS — E785 Hyperlipidemia, unspecified: Secondary | ICD-10-CM | POA: Diagnosis not present

## 2021-10-30 DIAGNOSIS — I10 Essential (primary) hypertension: Secondary | ICD-10-CM | POA: Diagnosis not present

## 2021-10-30 NOTE — Telephone Encounter (Signed)
Urine drug screen for this encounter is consistent for prescribed medication 

## 2021-11-06 NOTE — Progress Notes (Signed)
? ?Office Visit Note ? ?Patient: Kara Bush             ?Date of Birth: 1941-01-31           ?MRN: [id number]             ?PCP: Aretta Nip, MD ?Referring: Aretta Nip, MD ?Visit Date: 11/20/2021 ?Occupation: '@GUAROCC'$ @ ? ?Subjective:  ?Pain in both hips and lower back. ? ?History of Present Illness: Kara Bush is a 81 y.o. female with a history of osteoarthritis, and fibromyalgia syndrome.  She states that shoulder joint discomfort is better.  She is not doing repeated motion in her elbow joint discomfort is better.  She continues to have some stiffness in her bilateral hands.  She has been having increased pain and discomfort in bilateral trochanteric area which is causing her difficulty with walking and climbing stairs.  She has been also experiencing lower back pain which she describes in the SI joints.  There is no history of joint swelling.  She has been experiencing increased fatigue. ? ?Activities of Daily Living:  ?Patient reports morning stiffness for 2 hours.   ?Patient Reports nocturnal pain.  ?Difficulty dressing/grooming: Reports ?Difficulty climbing stairs: Reports ?Difficulty getting out of chair: Denies ?Difficulty using hands for taps, buttons, cutlery, and/or writing: Denies ? ?Review of Systems  ?Constitutional:  Positive for fatigue.  ?HENT:  Negative for mouth sores, mouth dryness and nose dryness.   ?Eyes:  Positive for pain, itching and dryness.  ?Respiratory:  Negative for shortness of breath and difficulty breathing.   ?Cardiovascular:  Negative for chest pain and palpitations.  ?Gastrointestinal:  Negative for blood in stool, constipation and diarrhea.  ?Endocrine: Negative for increased urination.  ?Genitourinary:  Negative for difficulty urinating.  ?Musculoskeletal:  Positive for joint pain, joint pain, myalgias, morning stiffness, muscle tenderness and myalgias. Negative for joint swelling.  ?Skin:  Negative for color change and sensitivity to sunlight.   ?Allergic/Immunologic: Negative for susceptible to infections.  ?Neurological:  Positive for weakness. Negative for dizziness, numbness, headaches and memory loss.  ?Hematological:  Negative for bruising/bleeding tendency.  ?Psychiatric/Behavioral:  Positive for sleep disturbance. Negative for depressed mood and confusion. The patient is not nervous/anxious.   ? ?PMFS History:  ?Patient Active Problem List  ? Diagnosis Date Noted  ? A-fib ([place]) 02/26/2021  ? Atrial fibrillation ([place]) 09/09/2018  ? DOE (dyspnea on exertion) 08/10/2017  ? 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  ? Greater trochanteric bursitis of right hip 11/07/2014  ? Sacro-iliac pain 11/07/2014  ? Chronic midline low back pain without sciatica 11/07/2013  ? Hepatitis 04/20/2013  ? Screening for STD (sexually transmitted disease) 04/20/2013  ? Recurrent UTI 04/20/2013  ? Abdominal aortic aneurysm ([place])   ? Ulcerative colitis ([place])   ? Obstructive sleep apnea   ? Fibromyalgia   ? Coronary artery disease   ? Basal cell carcinoma   ? Anemia, improved 01/11/2013  ? Ascending aortic aneurysm, 4.1 cm 01/11/2013  ? 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 Chronic periscapular pain 10/15/2011  ? Chest wall pain   ? S/P resection of aortic aneurysm, 1998 prior to CABG   ? PAD (peripheral artery disease), aortic-iliac bypass 1991, mild carotid bruits   ? OSA (obstructive sleep apnea)   ? Chronic fatigue fibromyalgia syndrome   ? Hyperlipidemia   ? Ulcerative colitis   ? Depression   ? CAD (coronary artery disease), CABG 1998, STENTS MULTIPLE SITES SINCE.    ? Hypertension   ?  ?  Past Medical History:  ?Diagnosis Date  ? Abdominal aortic aneurysm ([place])   ? Anemia, improved 01/11/2013  ? Basal cell carcinoma   ? CAD (coronary artery disease)   ? hx CABG 1998, last cath 2007 with PCI and stenting to the proximal segment of the vein graft to the diagonal branch. DES Taxus was placed  ? Cervicalgia   ? Chest wall pain   ? Chronic fatigue fibromyalgia  syndrome   ? Chronic pain syndrome   ? Coronary artery disease   ? Depression   ? Fibromyalgia   ? Fibromyalgia   ? History of nuclear stress test 08/07/2011  ? lexiscan; no evidence of ischemia or infarct; low risk   ? Hyperlipidemia   ? Hypertension   ? Hypertension   ? Obstructive sleep apnea   ? OSA (obstructive sleep apnea)   ? PAD (peripheral artery disease) ([place])   ? Pes anserine bursitis   ? Pulmonary embolism ([place]) 01/11/2013  ? S/P resection of aortic aneurysm   ? Trochanteric bursitis   ? Ulcerative colitis ([place])   ?  ?Family History  ?Problem Relation Age of Onset  ? Heart disease Father   ? Heart attack Father   ?     @ 40  ? Cancer Paternal Aunt   ?     BREAST  ? Heart failure Mother   ?     at 21  ? Healthy Sister   ? Cancer Brother   ?     pancreatic   ? Heart failure Maternal Grandmother   ? Heart attack Maternal Grandfather   ? Heart attack Paternal Grandmother   ?     at 76  ? Healthy Daughter   ? Healthy Daughter   ? ?Past Surgical History:  ?Procedure Laterality Date  ? [place]  ? CATARACT EXTRACTION Left 09/2019  ? CHOLECYSTECTOMY  1997  ? CORONARY ANGIOPLASTY WITH STENT PLACEMENT  2006  ? to LAD, ATRETIC LIMA AT THAT TIME ALSO  ? CORONARY ANGIOPLASTY WITH STENT PLACEMENT  2007  ? STENT TO PROX VG-DIAG, OTHER STENTS PATENT  ? CORONARY ARTERY BYPASS GRAFT  1998  ? LIMA-LAD;VG-DIAG & RCA  ? CORONARY STENT PLACEMENT  2005  ? TO LAD & LCX-STENTS,  ? HERNIA REPAIR    ? TUBAL LIGATION    ? BTL  ? ?Social History  ? ?Social History Narrative  ? Not on file  ? ?Immunization History  ?Administered Date(s) Administered  ? Influenza,inj,Quad PF,6+ Mos 04/20/2013, 06/01/2014  ? PFIZER(Purple Top)SARS-COV-2 Vaccination 08/18/2019, 09/11/2019, 04/27/2020  ?  ? ?Objective: ?Vital Signs: BP 129/74 (BP Location: Left Arm, Patient Position: Sitting, Cuff Size: Large)   Pulse 81   Ht '5\' 4"'$  (1.626 m)   Wt 186 lb 12.8 oz (84.7 kg)   BMI 32.06 kg/m?   ? ?Physical Exam ?Vitals and nursing  note reviewed.  ?Constitutional:   ?   Appearance: She is well-developed.  ?HENT:  ?   Head: Normocephalic and atraumatic.  ?Eyes:  ?   Conjunctiva/sclera: Conjunctivae normal.  ?Cardiovascular:  ?   Rate and Rhythm: Normal rate and regular rhythm.  ?   Heart sounds: Normal heart sounds.  ?Pulmonary:  ?   Effort: Pulmonary effort is normal.  ?   Breath sounds: Normal breath sounds.  ?Abdominal:  ?   General: Bowel sounds are normal.  ?   Palpations: Abdomen is soft.  ?Musculoskeletal:  ?   Cervical back: Normal range of  motion.  ?Lymphadenopathy:  ?   Cervical: No cervical adenopathy.  ?Skin: ?   General: Skin is warm and dry.  ?   Capillary Refill: Capillary refill takes less than 2 seconds.  ?Neurological:  ?   Mental Status: She is alert and oriented to person, place, and time.  ?Psychiatric:     ?   Behavior: Behavior normal.  ?  ? ?Musculoskeletal Exam: C-spine was in limited range of motion.  Shoulder joints, elbow joints, wrist joints with good range of motion.  She had bilateral PIP and DIP thickening with no synovitis.  Hip joints with good range of motion.  She had tenderness over bilateral SI joints and over bilateral trochanteric bursa.  Knee joints with good range of motion without any warmth swelling or effusion.  There was no tenderness over ankles or MTPs. ? ?CDAI Exam: ?CDAI Score: -- ?Patient Global: --; Provider Global: -- ?Swollen: --; Tender: -- ?Joint Exam 11/20/2021  ? ?No joint exam has been documented for this visit  ? ?There is currently no information documented on the homunculus. Go to the Rheumatology activity and complete the homunculus joint exam. ? ?Investigation: ?No additional findings. ? ?Imaging: ?No results found. ? ?Recent Labs: ?Lab Results  ?Component Value Date  ? WBC 8.4 02/27/2021  ? HGB 11.4 (L) 02/27/2021  ? PLT 195 02/27/2021  ? NA 144 11/19/2021  ? K 4.1 11/19/2021  ? CL 101 11/19/2021  ? CO2 28 11/19/2021  ? GLUCOSE 78 11/19/2021  ? BUN 25 11/19/2021  ? CREATININE 1.45  (H) 11/19/2021  ? BILITOT 0.6 02/27/2021  ? ALKPHOS 37 (L) 02/27/2021  ? AST 17 02/27/2021  ? ALT 16 02/27/2021  ? PROT 6.1 (L) 02/27/2021  ? ALBUMIN 3.7 02/27/2021  ? CALCIUM 9.5 11/19/2021  ? GFRAA 73 10/13/18

## 2021-11-10 DIAGNOSIS — G4733 Obstructive sleep apnea (adult) (pediatric): Secondary | ICD-10-CM | POA: Diagnosis not present

## 2021-11-11 NOTE — Progress Notes (Signed)
? ? ?Office Visit  ?  ?Patient Name: Kara Bush ?Date of Encounter: 11/11/2021 ? ?Primary Care Provider:  Aretta Nip, MD ?Primary Cardiologist:  Pixie Casino, MD ?Primary Electrophysiologist: None ?Chief Complaint  ?  ?Three week follow-up for CHF ? ?History of Present Illness  ?  ?Kara Bush is a 81 y.o. 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 Echo completed 1/23 with EF 55-60%, moderate AAS grade 2 DD.  She was diagnosed with A-fib/RVR on 7/26 with heart rates in the 110s-130s.  She was started on Eliquis and metoprolol succinate.  Echo completed 02/27/21 with EF of 55-60%, moderate LVH, mild mitral regurgitation. She was seen in clinic on 10/02/2021 and was up 10 pounds with lower extremity swelling.  She revealed dietary indiscretions salt.  She was instructed to take Lasix 20 mg x 4 days and potassium x4 days and stop. ?  ?Patient presented for follow-up on 10/16/2021 and her weight was up 8 pounds and volume overloaded on examination.  Valsartan was discontinued, she was started on Entresto 49-51 mg and her Lasix was increased for 4 days. ? ?Since last being seen in our clinic  Kara Bush  reports doing well however she is still volume overloaded on examination today.  She responded well to the 4 days of Lasix last appointment, she he is still having indiscretions with her diet and is eating processed foods and high sodium breads weekly. She is keeping her fluids under 2 L daily and her blood pressures have been elevated in the 169'C 789'F systolic at home.  She has tolerated Entresto well and has not dizziness or side effects. We discussed the importance of dietary restrictions of sodium in the setting of heart failure. She denies chest pain, palpitations, dyspnea, PND, orthopnea, nausea, vomiting, dizziness, syncope, edema, weight gain, or early satiety.  She has expressed interest in resuming water  exercise and we discussed the benefits of maintaining physical activity with managing heart failure. ? ?Past Medical History  ?  ?Past Medical History:  ?Diagnosis Date  ? Abdominal aortic aneurysm   ? Anemia, improved 01/11/2013  ? Basal cell carcinoma   ? CAD (coronary artery disease)   ? hx CABG 1998, last cath 2007 with PCI and stenting to the proximal segment of the vein graft to the diagonal branch. DES Taxus was placed  ? Cervicalgia   ? Chest wall pain   ? Chronic fatigue fibromyalgia syndrome   ? Chronic pain syndrome   ? Coronary artery disease   ? Depression   ? Fibromyalgia   ? Fibromyalgia   ? History of nuclear stress test 08/07/2011  ? lexiscan; no evidence of ischemia or infarct; low risk   ? Hyperlipidemia   ? Hypertension   ? Hypertension   ? Obstructive sleep apnea   ? OSA (obstructive sleep apnea)   ? PAD (peripheral artery disease) ([place])   ? Pes anserine bursitis   ? Pulmonary embolism ([place]) 01/11/2013  ? S/P resection of aortic aneurysm   ? Trochanteric bursitis   ? Ulcerative colitis ([place])   ? ?Past Surgical History:  ?Procedure Laterality Date  ? [place]  ? CATARACT EXTRACTION Left 09/2019  ? CHOLECYSTECTOMY  1997  ? CORONARY ANGIOPLASTY WITH STENT PLACEMENT  2006  ? to LAD, ATRETIC LIMA AT THAT TIME ALSO  ? CORONARY ANGIOPLASTY WITH STENT PLACEMENT  2007  ?  STENT TO PROX VG-DIAG, OTHER STENTS PATENT  ? CORONARY ARTERY BYPASS GRAFT  1998  ? LIMA-LAD;VG-DIAG & RCA  ? CORONARY STENT PLACEMENT  2005  ? TO LAD & LCX-STENTS,  ? HERNIA REPAIR    ? TUBAL LIGATION    ? BTL  ? ?Allergies ? ?Allergies  ?Allergen Reactions  ? Biotin Hives  ? Lisinopril Cough  ? Atenolol Other (See Comments)  ?  colitis  ? Bactrim [Sulfamethoxazole-Trimethoprim] Nausea And Vomiting  ? Cymbalta [Duloxetine Hcl] Other (See Comments)  ?  Mad pt feel spacey  ? Lyrica [Pregabalin] Other (See Comments)  ?  Made pt feel spacey  ? Penicillins Rash  ? ? ?Home Medications  ?  ?Current Outpatient Medications   ?Medication Sig Dispense Refill  ? amLODipine (NORVASC) 5 MG tablet TAKE ONE TABLET BY MOUTH ONCE DAILY 90 tablet 1  ? b complex vitamins capsule Take 1 capsule by mouth daily.    ? citalopram (CELEXA) 20 MG tablet Take 20 mg by mouth daily.     ? diclofenac sodium (VOLTAREN) 1 % GEL Apply 1 application topically daily as needed (joint pain).    ? ELIQUIS 5 MG TABS tablet TAKE ONE TABLET BY MOUTH TWICE DAILY 180 tablet 2  ? ezetimibe (ZETIA) 10 MG tablet TAKE ONE TABLET BY MOUTH ONCE DAILY 90 tablet 2  ? furosemide (LASIX) 40 MG tablet Take 1 tablet (40 mg total) by mouth daily. Take one tablet by mouth daily for 4 days 4 tablet 0  ? HYDROcodone-acetaminophen (NORCO/VICODIN) 5-325 MG tablet Take 1 tablet by mouth 4 (four) times daily as needed for moderate pain. May Take an extra 0.5 to one tablet when pain is severe. 140 tablet 0  ? methocarbamol (ROBAXIN) 500 MG tablet Take 1 tablet (500 mg total) by mouth every 8 (eight) hours as needed for muscle spasms. 30 tablet 1  ? metoprolol succinate (TOPROL-XL) 50 MG 24 hr tablet TAKE ONE TABLET BY MOUTH ONCE DAILY 90 tablet 1  ? Multiple Vitamin (MULITIVITAMIN WITH MINERALS) TABS Take 1 tablet by mouth daily.    ? ondansetron (ZOFRAN) 4 MG tablet Take 1 tablet (4 mg total) by mouth every 6 (six) hours as needed for nausea. 20 tablet 0  ? potassium chloride 20 MEQ TBCR Take 20 mEq by mouth daily. Take one tablet by mouth daily for 4 days 4 tablet 0  ? sacubitril-valsartan (ENTRESTO) 49-51 MG Take 1 tablet by mouth 2 (two) times daily. 180 tablet 3  ? simvastatin (ZOCOR) 40 MG tablet TAKE ONE TABLET BY MOUTH ONCE DAILY 90 tablet 2  ? zolpidem (AMBIEN CR) 12.5 MG CR tablet Take 12.5 mg by mouth at bedtime as needed.    ? ?No current facility-administered medications for this visit.  ?  ? ?Review of Systems  ?Please see the history of present illness.    ?All other systems reviewed and are otherwise negative except as noted above. ? ?Physical Exam  ?  ?Wt Readings from Last  3 Encounters:  ?10/24/21 188 lb (85.3 kg)  ?10/21/21 185 lb 9.6 oz (84.2 kg)  ?10/16/21 192 lb 9.6 oz (87.4 kg)  ? ?MW:NUUVO were no vitals filed for this visit.,There is no height or weight on file to calculate BMI. ? ?Constitutional:   ?   Appearance: Healthy appearance. Not in distress.  ?Neck:  ?   Vascular: JVD normal.  ?Pulmonary:  ?   Effort: Pulmonary effort is normal.  ?   Breath sounds: No wheezing. Crackles  present.  ?Cardiovascular:  ?   Normal rate. Irregular regular rhythm. Normal S1. Normal S2.   ?   Murmurs: There is no murmur.  ?Edema: ?   Peripheral edema present in bilateral ankles +1  ?Abdominal:  ?   Palpations: Abdomen is soft. There is no hepatomegaly.  ?Skin: ?   General: Skin is warm and dry.  ?Neurological:  ?   General: No focal deficit present.  ?   Mental Status: Alert and oriented to person, place and time.  ?   Cranial Nerves: Cranial nerves are intact.  ?EKG/LABS/Other Studies Reviewed  ?  ?ECG personally reviewed by me today - none completed today   ? ?Risk Assessment/Calculations:   ? ?CHA2DS2-VASc Score = 5  ? This indicates a 7.2% annual risk of stroke. ?The patient's score is based upon: ?CHF History: 0 ?HTN History: 1 ?Diabetes History: 0 ?Stroke History: 0 ?Vascular Disease History: 1 ?Age Score: 2 ?Gender Score: 1 ? ?Lab Results  ?Component Value Date  ? WBC 8.4 02/27/2021  ? HGB 11.4 (L) 02/27/2021  ? HCT 37.0 02/27/2021  ? MCV 83.3 02/27/2021  ? PLT 195 02/27/2021  ? ?Lab Results  ?Component Value Date  ? CREATININE 1.52 (H) 10/23/2021  ? BUN 25 10/23/2021  ? NA 141 10/23/2021  ? K 4.6 10/23/2021  ? CL 101 10/23/2021  ? CO2 25 10/23/2021  ? ?Lab Results  ?Component Value Date  ? ALT 16 02/27/2021  ? AST 17 02/27/2021  ? ALKPHOS 37 (L) 02/27/2021  ? BILITOT 0.6 02/27/2021  ? ?Lab Results  ?Component Value Date  ? CHOL 141 10/13/2019  ? HDL 56 10/13/2019  ? [place] 63 10/13/2019  ? TRIG 128 10/13/2019  ? CHOLHDL 2.5 10/13/2019  ?  ?No results found for: HGBA1C ? ?Assessment &  Plan  ?  ?1. HFpEF: ?-LVEF: 55-60% on 08/14/21 ?-Continue Entresto 49-51 mg BID and Toprol 50 mg daily.  -Start Lasix 40 mg daily and potassium 10 mEq daily. ?-Low sodium diet, fluid restriction <2L, and

## 2021-11-12 ENCOUNTER — Ambulatory Visit (HOSPITAL_BASED_OUTPATIENT_CLINIC_OR_DEPARTMENT_OTHER): Payer: PPO | Admitting: Nurse Practitioner

## 2021-11-12 ENCOUNTER — Encounter (HOSPITAL_BASED_OUTPATIENT_CLINIC_OR_DEPARTMENT_OTHER): Payer: Self-pay | Admitting: Nurse Practitioner

## 2021-11-12 VITALS — BP 158/62 | HR 95 | Ht 64.0 in | Wt 191.1 lb

## 2021-11-12 DIAGNOSIS — I2510 Atherosclerotic heart disease of native coronary artery without angina pectoris: Secondary | ICD-10-CM | POA: Diagnosis not present

## 2021-11-12 DIAGNOSIS — G4733 Obstructive sleep apnea (adult) (pediatric): Secondary | ICD-10-CM

## 2021-11-12 DIAGNOSIS — I5033 Acute on chronic diastolic (congestive) heart failure: Secondary | ICD-10-CM | POA: Diagnosis not present

## 2021-11-12 DIAGNOSIS — I10 Essential (primary) hypertension: Secondary | ICD-10-CM

## 2021-11-12 DIAGNOSIS — I4891 Unspecified atrial fibrillation: Secondary | ICD-10-CM | POA: Diagnosis not present

## 2021-11-12 MED ORDER — POTASSIUM CHLORIDE CRYS ER 10 MEQ PO TBCR: 10.0000 meq | EXTENDED_RELEASE_TABLET | Freq: Two times a day (BID) | ORAL | 3 refills | Status: DC

## 2021-11-12 MED ORDER — FUROSEMIDE 40 MG PO TABS: 40.0000 mg | ORAL_TABLET | Freq: Every day | ORAL | 3 refills | Status: DC

## 2021-11-12 NOTE — Patient Instructions (Signed)
Medication Instructions:  ?Your physician has recommended you make the following change in your medication:  ? ?Start: Lasix '40mg'$  daily  ? ?Start: Potassium 84mq daily  ? ?*If you need a refill on your cardiac medications before your next appointment, please call your pharmacy* ? ? ?Lab Work: ?Please return for Lab work in 1 week- BMET  You may come to the...  ? ?[place] ?[address] [place] ?Open: 8am-Noon and 1pm-4:30pm  ? ?[facility] ? ?[employer] [place] location ? ?**no appointments needed** ? ? ?If you have labs (blood work) drawn today and your tests are completely normal, you will receive your results only by: ?MyChart Message (if you have MyChart) OR ?A paper copy in the mail ?If you have any lab test that is abnormal or we need to change your treatment, we will call you to review the results. ? ? ?Testing/Procedures: ?None ordered today  ? ? ?Follow-Up: ?[facility] you and your health needs are our priority.  As part of our continuing mission to provide you with exceptional heart care, we have created designated Provider Care Teams.  These Care Teams include your primary Cardiologist (physician) and Advanced Practice Providers (APPs -  Physician Assistants and Nurse Practitioners) who all work together to provide you with the care you need, when you need it. ? ?We recommend signing up for the patient portal called "MyChart".  Sign up information is provided on this After Visit Summary.  MyChart is used to connect with patients for Virtual Visits (Telemedicine).  Patients are able to view lab/test results, encounter notes, upcoming appointments, etc.  Non-urgent messages can be sent to your provider as well.   ?To learn more about what you can do with MyChart, go to [email or web address]   ? ?Your next appointment:   ?1 month(s) with CLaurann Montana NP  ? ? And  ? ?4 months with Dr.  HDebara Pickett ? ?Other Instructions ?Please stop downstairs and get information from the front desk about the swimming.  ? ? ?Mediterranean Diet ?A Mediterranean diet refers to food and lifestyle choices that are based on the traditions of countries located on the [employer] It focuses on eating more fruits, vegetables, whole grains, beans, nuts, seeds, and heart-healthy fats, and eating less dairy, meat, eggs, and processed foods with added sugar, salt, and fat. This way of eating has been shown to help prevent certain conditions and improve outcomes for people who have chronic diseases, like kidney disease and heart disease. ?What are tips for following this plan? ?Reading food labels ?Check the serving size of packaged foods. For foods such as rice and pasta, the serving size refers to the amount of cooked product, not dry. ?Check the total fat in packaged foods. Avoid foods that have saturated fat or trans fats. ?Check the ingredient list for added sugars, such as corn syrup. ?Shopping ? ?Buy a variety of foods that offer a balanced diet, including: ?Fresh fruits and vegetables (produce). ?Grains, beans, nuts, and seeds. Some of these may be available in unpackaged forms or large amounts (in bulk). ?Fresh seafood. ?Poultry and eggs. ?Low-fat dairy products. ?Buy whole ingredients instead of prepackaged foods. ?Buy fresh fruits and vegetables in-season from local farmers markets. ?Buy plain frozen fruits and vegetables. ?If you do not have access to quality fresh seafood, buy precooked frozen shrimp or canned fish, such as tuna, salmon, or sardines. ?Stock your  pantry so you always have certain foods on hand, such as olive oil, canned tuna, canned tomatoes, rice, pasta, and beans. ?Cooking ?Cook foods with extra-virgin olive oil instead of using butter or other vegetable oils. ?Have meat as a side dish, and have vegetables or grains as your main dish. This means having meat in small portions or adding small  amounts of meat to foods like pasta or stew. ?Use beans or vegetables instead of meat in common dishes like chili or lasagna. ?Experiment with different cooking methods. Try roasting, broiling, steaming, and saut?ing vegetables. ?Add frozen vegetables to soups, stews, pasta, or rice. ?Add nuts or seeds for added healthy fats and plant protein at each meal. You can add these to yogurt, salads, or vegetable dishes. ?Marinate fish or vegetables using olive oil, lemon juice, garlic, and fresh herbs. ?Meal planning ?Plan to eat one vegetarian meal one day each week. Try to work up to two vegetarian meals, if possible. ?Eat seafood two or more times a week. ?Have healthy snacks readily available, such as: ?Vegetable sticks with hummus. ?[place] yogurt. ?Fruit and nut trail mix. ?Eat balanced meals throughout the week. This includes: ?Fruit: 2-3 servings a day. ?Vegetables: 4-5 servings a day. ?Low-fat dairy: 2 servings a day. ?Fish, poultry, or lean meat: 1 serving a day. ?Beans and legumes: 2 or more servings a week. ?Nuts and seeds: 1-2 servings a day. ?Whole grains: 6-8 servings a day. ?Extra-virgin olive oil: 3-4 servings a day. ?Limit red meat and sweets to only a few servings a month. ?Lifestyle ? ?Cook and eat meals together with your family, when possible. ?Drink enough fluid to keep your urine pale yellow. ?Be physically active every day. This includes: ?Aerobic exercise like running or swimming. ?Leisure activities like gardening, walking, or housework. ?Get 7-8 hours of sleep each night. ?If recommended by your health care provider, drink red wine in moderation. This means 1 glass a day for nonpregnant women and 2 glasses a day for men. A glass of wine equals 5 oz (150 mL). ?What foods should I eat? ?Fruits ?Apples. Apricots. Avocado. Berries. Bananas. Cherries. Dates. Figs. Grapes. Lemons. Melon. Oranges. Peaches. Plums. Pomegranate. ?Vegetables ?Artichokes. Beets. Broccoli. Cabbage. Carrots. Eggplant. Green  beans. Chard. Kale. Spinach. Onions. Leeks. Peas. Squash. Tomatoes. Peppers. Radishes. ?Grains ?Whole-grain pasta. Brown rice. Bulgur wheat. Polenta. Couscous. Whole-wheat bread. [place]. ?Meats and other proteins ?Beans. Almonds. Sunflower seeds. Pine nuts. Peanuts. Weed. Salmon. Scallops. Shrimp. [place]. Tilapia. Clams. Oysters. Eggs. Poultry without skin. ?Dairy ?Low-fat milk. Cheese. Greek yogurt. ?Fats and oils ?Extra-virgin olive oil. Avocado oil. Grapeseed oil. ?Beverages ?Water. Red wine. Herbal tea. ?Sweets and desserts ?Greek yogurt with honey. Baked apples. Poached pears. Trail mix. ?Seasonings and condiments ?Basil. Cilantro. Coriander. Cumin. Mint. Parsley. Sage. Rosemary. Tarragon. Garlic. Oregano. Thyme. Pepper. Balsamic vinegar. Tahini. Hummus. Tomato sauce. Olives. Mushrooms. ?The items listed above may not be a complete list of foods and beverages you can eat. Contact a dietitian for more information. ?What foods should I limit? ?This is a list of foods that should be eaten rarely or only on special occasions. ?Fruits ?Fruit canned in syrup. ?Vegetables ?Deep-fried potatoes (french fries). ?Grains ?Prepackaged pasta or rice dishes. Prepackaged cereal with added sugar. Prepackaged snacks with added sugar. ?Meats and other proteins ?Beef. Pork. Lamb. Poultry with skin. Hot dogs. [place] Salines. ?Dairy ?Ice cream. Sour cream. Whole milk. ?Fats and oils ?Butter. Canola oil. Vegetable oil. Beef fat (tallow). Lard. ?Beverages ?Juice. Sugar-sweetened soft drinks. Beer. Liquor and spirits. ?Sweets and desserts ?  Cookies. Cakes. Pies. Candy. ?Seasonings and condiments ?Mayonnaise. Pre-made sauces and marinades. ?The items listed above may not be a complete list of foods and beverages you should limit. Contact a dietitian for more information. ?Summary ?The Mediterranean diet includes both food and lifestyle choices. ?Eat a variety of fresh fruits and vegetables, beans, nuts, seeds, and whole grains. ?Limit the  amount of red meat and sweets that you eat. ?If recommended by your health care provider, drink red wine in moderation. This means 1 glass a day for nonpregnant women and 2 glasses a day for men. A glass of wine

## 2021-11-13 ENCOUNTER — Telehealth: Payer: Self-pay | Admitting: Nurse Practitioner

## 2021-11-13 MED ORDER — POTASSIUM CHLORIDE CRYS ER 10 MEQ PO TBCR: 10.0000 meq | EXTENDED_RELEASE_TABLET | Freq: Every day | ORAL | 3 refills | Status: DC

## 2021-11-13 NOTE — Telephone Encounter (Signed)
Spoke with pt, when she got her prescriptions from the pharmacy the script reads 10 meq of potassium twice daily and she was told once daily. According to the dictation in the chart, the potassium is 10 meq once daily. She has a follow up appointment 12/06/21. ?

## 2021-11-13 NOTE — Telephone Encounter (Signed)
Pt c/o medication issue: ? ?1. Name of Medication:  ?furosemide (LASIX) 40 MG tablet ?potassium chloride (KLOR-CON M) 10 MEQ tablet ? ?2. How are you currently taking this medication (dosage and times per day)? Needs to verify ? ?3. Are you having a reaction (difficulty breathing--STAT)? no ? ?4. What is your medication issue? Patient states she needs to verify what dose and how she is to take the medications. She says in the past she was taking 4 days of furosemide 40 mg and 20 mg of potassium. She says the instructions she was given say start lasix 40 mg daily and potassium 10 meq daily and was given a prescription for 3 month supply. She says she thought during her appointment it was discussed to take the medications for 4 days. Please advise.  ?

## 2021-11-19 DIAGNOSIS — I5033 Acute on chronic diastolic (congestive) heart failure: Secondary | ICD-10-CM | POA: Diagnosis not present

## 2021-11-19 LAB — BASIC METABOLIC PANEL
BUN/Creatinine Ratio: 17 (ref 12–28)
BUN: 25 mg/dL (ref 8–27)
CO2: 28 mmol/L (ref 20–29)
Calcium: 9.5 mg/dL (ref 8.7–10.3)
Chloride: 101 mmol/L (ref 96–106)
Creatinine, Ser: 1.45 mg/dL — ABNORMAL HIGH (ref 0.57–1.00)
Glucose: 78 mg/dL (ref 70–99)
Potassium: 4.1 mmol/L (ref 3.5–5.2)
Sodium: 144 mmol/L (ref 134–144)
eGFR: 36 mL/min/{1.73_m2} — ABNORMAL LOW (ref >59)

## 2021-11-20 ENCOUNTER — Encounter: Payer: Self-pay | Admitting: Rheumatology

## 2021-11-20 ENCOUNTER — Ambulatory Visit: Payer: PPO | Admitting: Rheumatology

## 2021-11-20 VITALS — BP 129/74 | HR 81 | Ht 64.0 in | Wt 186.8 lb

## 2021-11-20 DIAGNOSIS — M7711 Lateral epicondylitis, right elbow: Secondary | ICD-10-CM

## 2021-11-20 DIAGNOSIS — M4302 Spondylolysis, cervical region: Secondary | ICD-10-CM | POA: Diagnosis not present

## 2021-11-20 DIAGNOSIS — F5101 Primary insomnia: Secondary | ICD-10-CM

## 2021-11-20 DIAGNOSIS — Z8639 Personal history of other endocrine, nutritional and metabolic disease: Secondary | ICD-10-CM

## 2021-11-20 DIAGNOSIS — M7062 Trochanteric bursitis, left hip: Secondary | ICD-10-CM

## 2021-11-20 DIAGNOSIS — G4733 Obstructive sleep apnea (adult) (pediatric): Secondary | ICD-10-CM

## 2021-11-20 DIAGNOSIS — I2510 Atherosclerotic heart disease of native coronary artery without angina pectoris: Secondary | ICD-10-CM | POA: Diagnosis not present

## 2021-11-20 DIAGNOSIS — I480 Paroxysmal atrial fibrillation: Secondary | ICD-10-CM | POA: Diagnosis not present

## 2021-11-20 DIAGNOSIS — M797 Fibromyalgia: Secondary | ICD-10-CM | POA: Diagnosis not present

## 2021-11-20 DIAGNOSIS — M25511 Pain in right shoulder: Secondary | ICD-10-CM

## 2021-11-20 DIAGNOSIS — Z8719 Personal history of other diseases of the digestive system: Secondary | ICD-10-CM

## 2021-11-20 DIAGNOSIS — Z8659 Personal history of other mental and behavioral disorders: Secondary | ICD-10-CM

## 2021-11-20 DIAGNOSIS — M19041 Primary osteoarthritis, right hand: Secondary | ICD-10-CM

## 2021-11-20 DIAGNOSIS — M533 Sacrococcygeal disorders, not elsewhere classified: Secondary | ICD-10-CM | POA: Diagnosis not present

## 2021-11-20 DIAGNOSIS — M19042 Primary osteoarthritis, left hand: Secondary | ICD-10-CM

## 2021-11-20 DIAGNOSIS — Z8679 Personal history of other diseases of the circulatory system: Secondary | ICD-10-CM | POA: Diagnosis not present

## 2021-11-20 DIAGNOSIS — M7061 Trochanteric bursitis, right hip: Secondary | ICD-10-CM

## 2021-11-20 DIAGNOSIS — Z13820 Encounter for screening for osteoporosis: Secondary | ICD-10-CM

## 2021-11-20 DIAGNOSIS — G8929 Other chronic pain: Secondary | ICD-10-CM

## 2021-11-20 NOTE — Addendum Note (Signed)
Addended by: Earnestine Mealing on: 11/20/2021 03:18 PM ? ? Modules accepted: Orders ? ?

## 2021-11-20 NOTE — Patient Instructions (Addendum)
Iliotibial Band Syndrome Rehab ?Ask your health care provider which exercises are safe for you. Do exercises exactly as told by your health care provider and adjust them as directed. It is normal to feel mild stretching, pulling, tightness, or discomfort as you do these exercises. Stop right away if you feel sudden pain or your pain gets significantly worse. Do not begin these exercises until told by your health care provider. ?Stretching and range-of-motion exercises ?These exercises warm up your muscles and joints and improve the movement and flexibility of your hip and pelvis. ?Quadriceps stretch, prone ? ?Lie on your abdomen (prone position) on a firm surface, such as a bed or padded floor. ?Bend your left / right knee and reach back to hold your ankle or pant leg. If you cannot reach your ankle or pant leg, loop a belt around your foot and grab the belt instead. ?Gently pull your heel toward your buttocks. Your knee should not slide out to the side. You should feel a stretch in the front of your thigh and knee (quadriceps). ?Hold this position for __________ seconds. ?Repeat __________ times. Complete this exercise __________ times a day. ?Iliotibial band stretch ?An iliotibial band is a strong band of muscle tissue that runs from the outer side of your hip to the outer side of your thigh and knee. ?Lie on your side with your left / right leg in the top position. ?Bend both of your knees and grab your left / right ankle. Stretch out your bottom arm to help you balance. ?Slowly bring your top knee back so your thigh goes behind your trunk. ?Slowly lower your top leg toward the floor until you feel a gentle stretch on the outside of your left / right hip and thigh. If you do not feel a stretch and your knee will not fall farther, place the heel of your other foot on top of your knee and pull your knee down toward the floor with your foot. ?Hold this position for __________ seconds. ?Repeat __________ times.  Complete this exercise __________ times a day. ?Strengthening exercises ?These exercises build strength and endurance in your hip and pelvis. Endurance is the ability to use your muscles for a long time, even after they get tired. ?Straight leg raises, side-lying ?This exercise strengthens the muscles that rotate the leg at the hip and move it away from your body (hip abductors). ?Lie on your side with your left / right leg in the top position. Lie so your head, shoulder, hip, and knee line up. You may bend your bottom knee to help you balance. ?Roll your hips slightly forward so your hips are stacked directly over each other and your left / right knee is facing forward. ?Tense the muscles in your outer thigh and lift your top leg 4-6 inches (10-15 cm). ?Hold this position for __________ seconds. ?Slowly lower your leg to return to the starting position. Let your muscles relax completely before doing another repetition. ?Repeat __________ times. Complete this exercise __________ times a day. ?Leg raises, prone ?This exercise strengthens the muscles that move the hips backward (hip extensors). ?Lie on your abdomen (prone position) on your bed or a firm surface. You can put a pillow under your hips if that is more comfortable for your lower back. ?Bend your left / right knee so your foot is straight up in the air. ?Squeeze your buttocks muscles and lift your left / right thigh off the bed. Do not let your back arch. ?Tense  your thigh muscle as hard as you can without increasing any knee pain. ?Hold this position for __________ seconds. ?Slowly lower your leg to return to the starting position and allow it to relax completely. ?Repeat __________ times. Complete this exercise __________ times a day. ?Hip hike ?Stand sideways on a bottom step. Stand on your left / right leg with your other foot unsupported next to the step. You can hold on to a railing or wall for balance if needed. ?Keep your knees straight and your  torso square. Then lift your left / right hip up toward the ceiling. ?Slowly let your left / right hip lower toward the floor, past the starting position. Your foot should get closer to the floor. Do not lean or bend your knees. ?Repeat __________ times. Complete this exercise __________ times a day. ?This information is not intended to replace advice given to you by your health care provider. Make sure you discuss any questions you have with your health care provider. ?Document Revised: 09/28/2019 Document Reviewed: 09/28/2019 ?Elsevier Patient Education ? [employer]. ?Back Exercises ?The following exercises strengthen the muscles that help to support the trunk (torso) and back. They also help to keep the lower back flexible. Doing these exercises can help to prevent or lessen existing low back pain. ?If you have back pain or discomfort, try doing these exercises 2-3 times each day or as told by your health care provider. ?As your pain improves, do them once each day, but increase the number of times that you repeat the steps for each exercise (do more repetitions). ?To prevent the recurrence of back pain, continue to do these exercises once each day or as told by your health care provider. ?Do exercises exactly as told by your health care provider and adjust them as directed. It is normal to feel mild stretching, pulling, tightness, or discomfort as you do these exercises, but you should stop right away if you feel sudden pain or your pain gets worse. ?Exercises ?Single knee to chest ?Repeat these steps 3-5 times for each leg: ?Lie on your back on a firm bed or the floor with your legs extended. ?Bring one knee to your chest. Your other leg should stay extended and in contact with the floor. ?Hold your knee in place by grabbing your knee or thigh with both hands and hold. ?Pull on your knee until you feel a gentle stretch in your lower back or buttocks. ?Hold the stretch for 10-30 seconds. ?Slowly release  and straighten your leg. ? ?Pelvic tilt ?Repeat these steps 5-10 times: ?Lie on your back on a firm bed or the floor with your legs extended. ?Bend your knees so they are pointing toward the ceiling and your feet are flat on the floor. ?Tighten your lower abdominal muscles to press your lower back against the floor. This motion will tilt your pelvis so your tailbone points up toward the ceiling instead of pointing to your feet or the floor. ?With gentle tension and even breathing, hold this position for 5-10 seconds. ? ?Cat-cow ?Repeat these steps until your lower back becomes more flexible: ?Get into a hands-and-knees position on a firm bed or the floor. Keep your hands under your shoulders, and keep your knees under your hips. You may place padding under your knees for comfort. ?Let your head hang down toward your chest. Contract your abdominal muscles and point your tailbone toward the floor so your lower back becomes rounded like the back of a cat. ?Hold  this position for 5 seconds. ?Slowly lift your head, let your abdominal muscles relax, and point your tailbone up toward the ceiling so your back forms a sagging arch like the back of a cow. ?Hold this position for 5 seconds. ? ?Press-ups ?Repeat these steps 5-10 times: ?Lie on your abdomen (face-down) on a firm bed or the floor. ?Place your palms near your head, about shoulder-width apart. ?Keeping your back as relaxed as possible and keeping your hips on the floor, slowly straighten your arms to raise the top half of your body and lift your shoulders. Do not use your back muscles to raise your upper torso. You may adjust the placement of your hands to make yourself more comfortable. ?Hold this position for 5 seconds while you keep your back relaxed. ?Slowly return to lying flat on the floor. ? ?Bridges ?Repeat these steps 10 times: ?Lie on your back on a firm bed or the floor. ?Bend your knees so they are pointing toward the ceiling and your feet are flat on  the floor. Your arms should be flat at your sides, next to your body. ?Tighten your buttocks muscles and lift your buttocks off the floor until your waist is at almost the same height as your knees. You should

## 2021-11-21 DIAGNOSIS — L400 Psoriasis vulgaris: Secondary | ICD-10-CM | POA: Diagnosis not present

## 2021-11-21 DIAGNOSIS — Z85828 Personal history of other malignant neoplasm of skin: Secondary | ICD-10-CM | POA: Diagnosis not present

## 2021-11-21 DIAGNOSIS — B353 Tinea pedis: Secondary | ICD-10-CM | POA: Diagnosis not present

## 2021-11-28 DIAGNOSIS — M25552 Pain in left hip: Secondary | ICD-10-CM | POA: Diagnosis not present

## 2021-11-28 DIAGNOSIS — M545 Low back pain, unspecified: Secondary | ICD-10-CM | POA: Diagnosis not present

## 2021-11-28 DIAGNOSIS — M6281 Muscle weakness (generalized): Secondary | ICD-10-CM | POA: Diagnosis not present

## 2021-11-28 DIAGNOSIS — M25551 Pain in right hip: Secondary | ICD-10-CM | POA: Diagnosis not present

## 2021-12-02 DIAGNOSIS — E7800 Pure hypercholesterolemia, unspecified: Secondary | ICD-10-CM | POA: Diagnosis not present

## 2021-12-02 DIAGNOSIS — I10 Essential (primary) hypertension: Secondary | ICD-10-CM | POA: Diagnosis not present

## 2021-12-02 DIAGNOSIS — M797 Fibromyalgia: Secondary | ICD-10-CM | POA: Diagnosis not present

## 2021-12-02 DIAGNOSIS — Z0000 Encounter for general adult medical examination without abnormal findings: Secondary | ICD-10-CM | POA: Diagnosis not present

## 2021-12-02 DIAGNOSIS — M1990 Unspecified osteoarthritis, unspecified site: Secondary | ICD-10-CM | POA: Diagnosis not present

## 2021-12-02 DIAGNOSIS — R7303 Prediabetes: Secondary | ICD-10-CM | POA: Diagnosis not present

## 2021-12-02 DIAGNOSIS — G479 Sleep disorder, unspecified: Secondary | ICD-10-CM | POA: Diagnosis not present

## 2021-12-02 DIAGNOSIS — F339 Major depressive disorder, recurrent, unspecified: Secondary | ICD-10-CM | POA: Diagnosis not present

## 2021-12-02 DIAGNOSIS — I4891 Unspecified atrial fibrillation: Secondary | ICD-10-CM | POA: Diagnosis not present

## 2021-12-03 DIAGNOSIS — M25552 Pain in left hip: Secondary | ICD-10-CM | POA: Diagnosis not present

## 2021-12-03 DIAGNOSIS — M545 Low back pain, unspecified: Secondary | ICD-10-CM | POA: Diagnosis not present

## 2021-12-03 DIAGNOSIS — M6281 Muscle weakness (generalized): Secondary | ICD-10-CM | POA: Diagnosis not present

## 2021-12-03 DIAGNOSIS — M25551 Pain in right hip: Secondary | ICD-10-CM | POA: Diagnosis not present

## 2021-12-05 DIAGNOSIS — M545 Low back pain, unspecified: Secondary | ICD-10-CM | POA: Diagnosis not present

## 2021-12-05 DIAGNOSIS — M25552 Pain in left hip: Secondary | ICD-10-CM | POA: Diagnosis not present

## 2021-12-05 DIAGNOSIS — M6281 Muscle weakness (generalized): Secondary | ICD-10-CM | POA: Diagnosis not present

## 2021-12-05 DIAGNOSIS — M25551 Pain in right hip: Secondary | ICD-10-CM | POA: Diagnosis not present

## 2021-12-06 ENCOUNTER — Ambulatory Visit (HOSPITAL_BASED_OUTPATIENT_CLINIC_OR_DEPARTMENT_OTHER): Payer: PPO | Admitting: Family

## 2021-12-06 VITALS — BP 146/56 | HR 68 | Ht 64.0 in | Wt 191.6 lb

## 2021-12-06 DIAGNOSIS — I480 Paroxysmal atrial fibrillation: Secondary | ICD-10-CM | POA: Diagnosis not present

## 2021-12-06 DIAGNOSIS — D6859 Other primary thrombophilia: Secondary | ICD-10-CM | POA: Diagnosis not present

## 2021-12-06 DIAGNOSIS — G4733 Obstructive sleep apnea (adult) (pediatric): Secondary | ICD-10-CM

## 2021-12-06 DIAGNOSIS — I5032 Chronic diastolic (congestive) heart failure: Secondary | ICD-10-CM

## 2021-12-06 MED ORDER — POTASSIUM CHLORIDE CRYS ER 10 MEQ PO TBCR: 10.0000 meq | EXTENDED_RELEASE_TABLET | ORAL | 3 refills | Status: DC | PRN

## 2021-12-06 MED ORDER — FUROSEMIDE 40 MG PO TABS: 40.0000 mg | ORAL_TABLET | ORAL | 3 refills | Status: DC | PRN

## 2021-12-06 MED ORDER — ENTRESTO 97-103 MG PO TABS: 1.0000 | ORAL_TABLET | Freq: Two times a day (BID) | ORAL | 1 refills | Status: DC

## 2021-12-06 NOTE — Patient Instructions (Signed)
Medication Instructions:  ?Your physician has recommended you make the following change in your medication:  ? ?Change: Lasix '40mg'$  daily as needed for swelling or a weight gain of 2 pounds overnight or 5 pounds in a week, When you take this please take Potassium 8mq daily  ? ?Change: Potassium 174m daily as needed when you take Lasix  ? ?Change: Entresto 97-103 Twice daily  ? ?*If you need a refill on your cardiac medications before your next appointment, please call your pharmacy* ? ? ?Lab Work: ?Please return for Lab work  In 2 weeks for a BMET. You may come to the...  ? ?[place]) ?[address]?Open: 8am-Noon and 1pm-4:30pm  ? ?[facility]? ?[place] [place] location ? ?**no appointments needed** ? ?If you have labs (blood work) drawn today and your tests are completely normal, you will receive your results only by: ?MyChart Message (if you have MyChart) OR ?A paper copy in the mail ?If you have any lab test that is abnormal or we need to change your treatment, we will call you to review the results. ? ? ?Testing/Procedures: ?None ordered today  ? ? ?Follow-Up: ?[facility] needs are our priority.  As part of our continuing mission to provide you with exceptional heart care, we have created designated Provider Care Teams.  These Care Teams include your primary Cardiologist (physician) and Advanced Practice Providers (APPs -  Physician Assistants and Nurse Practitioners) who all work together to provide you with the care you need, when you need it. ? ?We recommend signing up for the patient portal called "MyChart".  Sign up information is provided on this After Visit Summary.  MyChart is used to connect with patients for Virtual Visits (Telemedicine).  Patients are able to view lab/test results, encounter notes, upcoming appointments, etc.  Non-urgent messages can be  sent to your provider as well.   ?To learn more about what you can do with MyChart, go to [email or web address]  ? ?Your next appointment:   ?As scheduled with Dr. HiDebara Pickett? ?Other Instructions ?Heart Healthy Diet Recommendations: ?A low-salt diet is recommended. Meats should be grilled, baked, or boiled. Avoid fried foods. Focus on lean protein sources like fish or chicken with vegetables and fruits. The American Heart Association is a GRMicrobiologist American Heart Association Diet and Lifeystyle Recommendations  ? ?Exercise recommendations: ?The American Heart Association recommends 150 minutes of moderate intensity exercise weekly. ?Try 30 minutes of moderate intensity exercise 4-5 times per week. ?This could include walking, jogging, or swimming. ? ?Tips to Measure your Blood Pressure Correctly ? ?To determine whether you have hypertension, a medical professional will take a blood pressure reading. How you prepare for the test, the position of your arm, and other factors can change a blood pressure reading by 10% or more. That could be enough to hide high blood pressure, start you on a drug you don't really need, or lead your doctor to incorrectly adjust your medications. ? ?National and international guidelines offer specific instructions for measuring blood pressure. If a doctor, nurse, or medical assistant isn't doing it right, don't hesitate to ask him or her to get with the guidelines. ? ?Here's what you can do to ensure a correct reading: ? Don't drink a caffeinated beverage or smoke during the 30 minutes before the test. ? Sit quietly for five minutes  before the test begins. ? During the measurement, sit in a chair with your feet on the floor and your arm supported so your elbow is at about heart level. ? The inflatable part of the cuff should completely cover at least 80% of your upper arm, and the cuff should be placed on bare skin, not over a shirt. ? Don't talk during the measurement. ? Have your  blood pressure measured twice, with a brief break in between. If the readings are different by 5 points or more, have it done a third time. ? ?In 2017, new guidelines from the [place], the [employer] of Cardiology, and nine other health organizations lowered the diagnosis of high blood pressure to 130/80 mm Hg or higher for all adults. The guidelines also redefined the various blood pressure categories to now include normal, elevated, Stage 1 hypertension, Stage 2 hypertension, and hypertensive crisis (see "Blood pressure categories"). ? ?Blood pressure categories  ?Blood pressure category SYSTOLIC ?(upper number)  DIASTOLIC ?(lower number)  ?Normal Less than 120 mm Hg and Less than 80 mm Hg  ?Elevated 120-129 mm Hg and Less than 80 mm Hg  ?High blood pressure: Stage 1 hypertension 130-139 mm Hg or 80-89 mm Hg  ?High blood pressure: Stage 2 hypertension 140 mm Hg or higher or 90 mm Hg or higher  ?Hypertensive crisis (consult your doctor immediately) Higher than 180 mm Hg and/or Higher than 120 mm Hg  ?Source: American Heart Association and American Stroke Association. ?For more on getting your blood pressure under control, buy Controlling Your Blood Pressure, a Special Health Report from [facility]. ? ? ?Blood Pressure Log ? ? ?Date ?  ?Time  ?Blood Pressure  ?Position  ?Example: Nov 1 9 AM 124/78 sitting  ? ?     ? ?     ? ?     ? ?     ? ?     ? ?     ? ?     ? ?     ? ? ?  ? ? ?Important Information About Sugar ? ? ? ? ? ? ?

## 2021-12-06 NOTE — Progress Notes (Signed)
? ?Office Visit  ?  ?Patient Name: Kara Bush ?Date of Encounter: 12/06/2021 ? ?PCP:  Aretta Nip, MD ?  ?[place]  ?Cardiologist:  Pixie Casino, MD  ?Advanced Practice Provider:  No care team member to display ?Electrophysiologist:  None  ? ?Chief Complaint  ?  ?Kara Bush is a 81 y.o. female with a hx of CAD s/p CABG 1998, PAD s/p iliac bypass, TIA, PE, PAF, hyperlipidemia, hypertension, OSA, diastolic heart failure presents today for heart failure follow up.  ? ?Past Medical History  ?  ?Past Medical History:  ?Diagnosis Date  ? Abdominal aortic aneurysm ([place])   ? Anemia, improved 01/11/2013  ? Basal cell carcinoma   ? CAD (coronary artery disease)   ? hx CABG 1998, last cath 2007 with PCI and stenting to the proximal segment of the vein graft to the diagonal branch. DES Taxus was placed  ? Cervicalgia   ? Chest wall pain   ? Chronic fatigue fibromyalgia syndrome   ? Chronic pain syndrome   ? Coronary artery disease   ? Depression   ? Fibromyalgia   ? Fibromyalgia   ? History of nuclear stress test 08/07/2011  ? lexiscan; no evidence of ischemia or infarct; low risk   ? Hyperlipidemia   ? Hypertension   ? Hypertension   ? Obstructive sleep apnea   ? OSA (obstructive sleep apnea)   ? PAD (peripheral artery disease) ([place])   ? Pes anserine bursitis   ? Pulmonary embolism ([place]) 01/11/2013  ? S/P resection of aortic aneurysm   ? Trochanteric bursitis   ? Ulcerative colitis ([place])   ? ?Past Surgical History:  ?Procedure Laterality Date  ? [place]  ? CATARACT EXTRACTION Left 09/2019  ? CHOLECYSTECTOMY  1997  ? CORONARY ANGIOPLASTY WITH STENT PLACEMENT  2006  ? to LAD, ATRETIC LIMA AT THAT TIME ALSO  ? CORONARY ANGIOPLASTY WITH STENT PLACEMENT  2007  ? STENT TO PROX VG-DIAG, OTHER STENTS PATENT  ? CORONARY ARTERY BYPASS GRAFT  1998  ? LIMA-LAD;VG-DIAG & RCA  ? CORONARY STENT PLACEMENT  2005  ? TO LAD & LCX-STENTS,  ? HERNIA REPAIR    ? TUBAL  LIGATION    ? BTL  ? ? ?Allergies ? ?Allergies  ?Allergen Reactions  ? Biotin Hives  ? Lisinopril Cough  ? Atenolol Other (See Comments)  ?  colitis  ? Bactrim [Sulfamethoxazole-Trimethoprim] Nausea And Vomiting  ? Cymbalta [Duloxetine Hcl] Other (See Comments)  ?  Mad pt feel spacey  ? Lyrica [Pregabalin] Other (See Comments)  ?  Made pt feel spacey  ? Penicillins Rash  ? ? ?History of Present Illness  ?  ?Kara Bush is a 81 y.o. female with a hx of CAD s/p CABG 1998, PAD s/p iliac bypass, TIA, PE, PAF, hyperlipidemia, hypertension, OSA, thoracic aortic aneurysm, diastolic heart failure last seen 11/12/21 by Ambrose Pancoast, NP. ? ?Paroxysmal atrial fibrillation with diagnosed February 2021 she was started on metoprolol and Eliquis.  CTA 09/2020 with dilated thoracic aortic aneurysm 4.1 cm recommended for 1 year follow-up study.  Hospitalization July 2022 with atrial fibrillation with RVR and metoprolol increased to 50 mg daily.  Echocardiogram 02/27/2021 EF 55 to 60%, moderate LVH, mild MR, trivial TR, mild dilation ascending aortic root 38 mm. ? ?She was seen in clinic 10/16/2021 as well as 11/12/2021 with evidence of volume overload and started on Entresto and Lasix adjusted. ? ?When last  seen 11/12/2021 she was started on Lasix 40 mg daily and potassium 10 mill equivalent daily. ? ?She presents today for follow up. Pleasant lady who enjoys playing bridge on Tuesdays and Saturdays. Looking forward to her granddaughter's wedding in October. Her grandson lives with her. Tells me she came off Lasix as she felt she had too little fluid with cramps. She last took the Lasix on Tuesday. Has not noted any significant increase in her edema though weight is up 3 pounds.  Reports no chest pain, orthopnea, PND. ? ?12/02/21 a1c 6.1, ALT 12, AST 17, K 4.6, creatinine 1.33, GFR 40 ?total cholesterol 146, HDL 60, LDL 65, triglycerides 122 ? ?EKGs/Labs/Other Studies Reviewed:  ? ?The following studies were reviewed today: ? ?Echo  08/14/21 ? 1. Left ventricular ejection fraction, by estimation, is 55 to 60%. The  ?left ventricle has normal function. The left ventricle has no regional  ?wall motion abnormalities. Left ventricular diastolic parameters are  ?consistent with Grade II diastolic  ?dysfunction (pseudonormalization).  ? 2. Right ventricular systolic function is normal. The right ventricular  ?size is normal. There is normal pulmonary artery systolic pressure. The  ?estimated right ventricular systolic pressure is 15.1 mmHg.  ? 3. The mitral valve is normal in structure. Trivial mitral valve  ?regurgitation. No evidence of mitral stenosis.  ? 4. 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  ?Aortic valve regurgitation is mild. 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 Aortic valve area, by VTI measures 1.03  ?cm?Marland Kitchen Aortic valve mean gradient  ?measures 14.0 mmHg.  ? 5. The inferior vena cava is normal in size with greater than 50%  ?respiratory variability, suggesting right atrial pressure of 3 mmHg.  ? ?EKG:  No EKG today. ? ?Recent Labs: ?02/27/2021: ALT 16; Hemoglobin 11.4; Platelets 195 ?10/23/2021: BNP 96.8 ?11/19/2021: BUN 25; Creatinine, Ser 1.45; Potassium 4.1; Sodium 144  ?Recent Lipid Panel ?   ?Component Value Date/Time  ? CHOL 141 10/13/2019 0817  ? TRIG 128 10/13/2019 0817  ? HDL 56 10/13/2019 0817  ? CHOLHDL 2.5 10/13/2019 0817  ? [place] 63 10/13/2019 0817  ? ? ?Risk Assessment/Calculations:  ? ?CHA2DS2-VASc Score = 5  ? This indicates a 7.2% annual risk of stroke. ?The patient's score is based upon: ?CHF History: 0 ?HTN History: 1 ?Diabetes History: 0 ?Stroke History: 0 ?Vascular Disease History: 1 ?Age Score: 2 ?Gender Score: 1 ?  ?Home Medications  ? ?Current Meds  ?Medication Sig  ? amLODipine (NORVASC) 5 MG tablet TAKE ONE TABLET BY MOUTH ONCE DAILY  ? b complex vitamins capsule Take 1 capsule by mouth daily.  ? citalopram (CELEXA) 20 MG tablet Take 20 mg by mouth  daily.   ? ELIQUIS 5 MG TABS tablet TAKE ONE TABLET BY MOUTH TWICE DAILY  ? ezetimibe (ZETIA) 10 MG tablet TAKE ONE TABLET BY MOUTH ONCE DAILY  ? furosemide (LASIX) 40 MG tablet Take 1 tablet (40 mg total) by mouth daily. Take one tablet by mouth daily for 4 days  ? HYDROcodone-acetaminophen (NORCO/VICODIN) 5-325 MG tablet Take 1 tablet by mouth 4 (four) times daily as needed for moderate pain. May Take an extra 0.5 to one tablet when pain is severe.  ? methocarbamol (ROBAXIN) 500 MG tablet Take 1 tablet (500 mg total) by mouth every 8 (eight) hours as needed for muscle spasms.  ? metoprolol succinate (TOPROL-XL) 50 MG 24 hr tablet TAKE ONE TABLET BY MOUTH ONCE DAILY  ? Multiple Vitamin (  MULITIVITAMIN WITH MINERALS) TABS Take 1 tablet by mouth daily.  ? potassium chloride (KLOR-CON M) 10 MEQ tablet Take 1 tablet (10 mEq total) by mouth daily.  ? sacubitril-valsartan (ENTRESTO) 49-51 MG Take 1 tablet by mouth 2 (two) times daily.  ? simvastatin (ZOCOR) 40 MG tablet TAKE ONE TABLET BY MOUTH ONCE DAILY  ? zolpidem (AMBIEN CR) 12.5 MG CR tablet Take 12.5 mg by mouth at bedtime as needed.  ?  ? ?Review of Systems  ?    ?All other systems reviewed and are otherwise negative except as noted above. ? ?Physical Exam  ?  ?VS:  BP (!) 146/56 (BP Location: Right Arm, Patient Position: Sitting, Cuff Size: Large)   Pulse 68   Ht '5\' 4"'$  (1.626 m)   Wt 191 lb 9.6 oz (86.9 kg)   SpO2 98%   BMI 32.89 kg/m?  , BMI Body mass index is 32.89 kg/m?. ? ?Wt Readings from Last 3 Encounters:  ?12/06/21 191 lb 9.6 oz (86.9 kg)  ?11/20/21 186 lb 12.8 oz (84.7 kg)  ?11/12/21 191 lb 1.6 oz (86.7 kg)  ?  ? ?GEN: Well nourished, well developed, in no acute distress. ?HEENT: normal. ?Neck: Supple, no JVD, carotid bruits, or masses. ?Cardiac: IRIR, no murmurs, rubs, or gallops. No clubbing, cyanosis. Trace pedal edema.  Radials/PT 2+ and equal bilaterally.  ?Respiratory:  Respirations regular and unlabored, clear to auscultation bilaterally. ?GI:  Soft, nontender, nondistended. ?MS: No deformity or atrophy. ?Skin: Warm and dry, no rash. ?Neuro:  Strength and sensation are intact. ?Psych: Normal affect. ? ?Assessment & Plan  ?  ?HFpEF - Weight up 4 pounds t

## 2021-12-07 ENCOUNTER — Encounter (HOSPITAL_BASED_OUTPATIENT_CLINIC_OR_DEPARTMENT_OTHER): Payer: Self-pay | Admitting: Family

## 2021-12-10 DIAGNOSIS — M25551 Pain in right hip: Secondary | ICD-10-CM | POA: Diagnosis not present

## 2021-12-10 DIAGNOSIS — M545 Low back pain, unspecified: Secondary | ICD-10-CM | POA: Diagnosis not present

## 2021-12-10 DIAGNOSIS — M6281 Muscle weakness (generalized): Secondary | ICD-10-CM | POA: Diagnosis not present

## 2021-12-10 DIAGNOSIS — M25552 Pain in left hip: Secondary | ICD-10-CM | POA: Diagnosis not present

## 2021-12-10 DIAGNOSIS — G4733 Obstructive sleep apnea (adult) (pediatric): Secondary | ICD-10-CM | POA: Diagnosis not present

## 2021-12-13 DIAGNOSIS — M6281 Muscle weakness (generalized): Secondary | ICD-10-CM | POA: Diagnosis not present

## 2021-12-13 DIAGNOSIS — M545 Low back pain, unspecified: Secondary | ICD-10-CM | POA: Diagnosis not present

## 2021-12-13 DIAGNOSIS — M25552 Pain in left hip: Secondary | ICD-10-CM | POA: Diagnosis not present

## 2021-12-13 DIAGNOSIS — M25551 Pain in right hip: Secondary | ICD-10-CM | POA: Diagnosis not present

## 2021-12-20 DIAGNOSIS — M25552 Pain in left hip: Secondary | ICD-10-CM | POA: Diagnosis not present

## 2021-12-20 DIAGNOSIS — M545 Low back pain, unspecified: Secondary | ICD-10-CM | POA: Diagnosis not present

## 2021-12-20 DIAGNOSIS — M6281 Muscle weakness (generalized): Secondary | ICD-10-CM | POA: Diagnosis not present

## 2021-12-20 DIAGNOSIS — M25551 Pain in right hip: Secondary | ICD-10-CM | POA: Diagnosis not present

## 2021-12-23 ENCOUNTER — Encounter: Payer: PPO | Admitting: Registered Nurse

## 2021-12-23 DIAGNOSIS — J029 Acute pharyngitis, unspecified: Secondary | ICD-10-CM | POA: Diagnosis not present

## 2021-12-24 DIAGNOSIS — I5032 Chronic diastolic (congestive) heart failure: Secondary | ICD-10-CM | POA: Diagnosis not present

## 2021-12-25 ENCOUNTER — Encounter: Payer: PPO | Attending: Registered Nurse | Admitting: Registered Nurse

## 2021-12-25 ENCOUNTER — Telehealth (HOSPITAL_BASED_OUTPATIENT_CLINIC_OR_DEPARTMENT_OTHER): Payer: Self-pay

## 2021-12-25 VITALS — BP 107/68 | HR 70 | Ht 64.0 in | Wt 186.6 lb

## 2021-12-25 DIAGNOSIS — Z5181 Encounter for therapeutic drug level monitoring: Secondary | ICD-10-CM | POA: Diagnosis not present

## 2021-12-25 DIAGNOSIS — M797 Fibromyalgia: Secondary | ICD-10-CM | POA: Diagnosis not present

## 2021-12-25 DIAGNOSIS — M25561 Pain in right knee: Secondary | ICD-10-CM | POA: Insufficient documentation

## 2021-12-25 DIAGNOSIS — M25552 Pain in left hip: Secondary | ICD-10-CM | POA: Diagnosis not present

## 2021-12-25 DIAGNOSIS — M7062 Trochanteric bursitis, left hip: Secondary | ICD-10-CM | POA: Diagnosis not present

## 2021-12-25 DIAGNOSIS — Z79891 Long term (current) use of opiate analgesic: Secondary | ICD-10-CM | POA: Insufficient documentation

## 2021-12-25 DIAGNOSIS — M25551 Pain in right hip: Secondary | ICD-10-CM | POA: Diagnosis not present

## 2021-12-25 DIAGNOSIS — M545 Low back pain, unspecified: Secondary | ICD-10-CM | POA: Insufficient documentation

## 2021-12-25 DIAGNOSIS — I5032 Chronic diastolic (congestive) heart failure: Secondary | ICD-10-CM

## 2021-12-25 DIAGNOSIS — M7061 Trochanteric bursitis, right hip: Secondary | ICD-10-CM | POA: Insufficient documentation

## 2021-12-25 DIAGNOSIS — G894 Chronic pain syndrome: Secondary | ICD-10-CM | POA: Diagnosis not present

## 2021-12-25 DIAGNOSIS — M25562 Pain in left knee: Secondary | ICD-10-CM | POA: Insufficient documentation

## 2021-12-25 DIAGNOSIS — M6281 Muscle weakness (generalized): Secondary | ICD-10-CM | POA: Diagnosis not present

## 2021-12-25 DIAGNOSIS — G8929 Other chronic pain: Secondary | ICD-10-CM | POA: Diagnosis not present

## 2021-12-25 LAB — BASIC METABOLIC PANEL
BUN/Creatinine Ratio: 14 (ref 12–28)
BUN: 21 mg/dL (ref 8–27)
CO2: 22 mmol/L (ref 20–29)
Calcium: 9.4 mg/dL (ref 8.7–10.3)
Chloride: 102 mmol/L (ref 96–106)
Creatinine, Ser: 1.55 mg/dL — ABNORMAL HIGH (ref 0.57–1.00)
Glucose: 123 mg/dL — ABNORMAL HIGH (ref 70–99)
Potassium: 4.4 mmol/L (ref 3.5–5.2)
Sodium: 143 mmol/L (ref 134–144)
eGFR: 33 mL/min/{1.73_m2} — ABNORMAL LOW (ref >59)

## 2021-12-25 MED ORDER — HYDROCODONE-ACETAMINOPHEN 5-325 MG PO TABS: 1.0000 | ORAL_TABLET | Freq: Four times a day (QID) | ORAL | 0 refills | Status: DC | PRN

## 2021-12-25 MED ORDER — HYDROCODONE-ACETAMINOPHEN 5-325 MG PO TABS
1.0000 | ORAL_TABLET | Freq: Four times a day (QID) | ORAL | 0 refills | Status: DC | PRN
Start: 2021-12-25 — End: 2022-02-19

## 2021-12-25 NOTE — Telephone Encounter (Signed)
BP/ weights as requested

## 2021-12-25 NOTE — Telephone Encounter (Signed)
Blood pressure looks overall well controlled.  Good result!  Her weight has come down which is reassuring.  Recommend she continue to take the Lasix only as needed for weight gain of 2 pounds overnight or 5 pounds in 1 week.  Continue Entresto at current dose with plan for labs as scheduled.  Loel Dubonnet, NP

## 2021-12-25 NOTE — Telephone Encounter (Signed)
RN returned call to patient and provided the following recommendations. Patient states that she really believes the Delene Loll is making the difference. However, she voiced that it is costing her nearly $400 dollars for a 3 month supply.   RN started prior [place], [place]: [id number], determination PA is not required for this patient. Will made patient assistance forms.    RN called patient to ask her to come to office on Friday to fill out patient assistance forms, and she states that she is not in financial need at this time and will let us know in the future if she needs assistance.       "Blood pressure looks overall well controlled.  Good result!  Her weight has come down which is reassuring.  Recommend she continue to take the Lasix only as needed for weight gain of 2 pounds overnight or 5 pounds in 1 week.  Continue Entresto at current dose with plan for labs as scheduled.   Loel Dubonnet, NP "

## 2021-12-25 NOTE — Telephone Encounter (Signed)
Patient is returning RN's call.   Recent BP readings: 12/13/21 137/71  12/16/21 143/77  12/23/21 126/65  12/24/21 146/76 left arm or 129/78 right arm 12/26/21 107/68  Recent weights:  12/25/21 186.9 lbs 12/23/21 185.2 lbs.

## 2021-12-25 NOTE — Telephone Encounter (Addendum)
Called results to patient and left results on VM (ok per DPR), instructions left to call office back with recent BP and Weights, labs mailed to patient.     ----- Message from Loel Dubonnet, NP sent at 12/25/2021  8:44 AM EDT ----- Kidney function slightly decreased from previous. Normal electrolytes. Ensure restricting to <2L fluid per day and following low sodium diet. Recommend repeat BMP in 2 weeks for monitoring.   Daphene Jaeger - can we please ask the patient for her recent weights and BP?

## 2021-12-25 NOTE — Progress Notes (Signed)
Subjective:    Patient ID: Kara Bush, female    DOB: 1941-03-17, 80 y.o.   MRN: [id number]  HPI: Kara Bush is a 81 y.o. female who returns for follow up appointment for chronic pain and medication refill. She states her pain is located in her mid- back pain, bilateral hip pain and bilateral knee pain R>L. She rates her pain 4. Her current exercise regime is walking and performing stretching exercises and attending physical therapy two days a week.  Kara Bush Morphine equivalent is 23.33 MME.   Last UDS was Performed on 10/21/2021, it was consistent.     Pain Inventory Average Pain 4 Pain Right Now 4 My pain is constant and aching  In the last 24 hours, has pain interfered with the following? General activity 4 Relation with others 0 Enjoyment of life 2 What TIME of day is your pain at its worst? morning  Bush (in general) Good  Pain is worse with: walking and some activites Pain improves with: medication Relief from Meds: 5  Family History  Problem Relation Age of Onset   Heart disease Father    Heart attack Father        @ 49   Cancer Paternal Aunt        BREAST   Heart failure Mother        at 18   Healthy Sister    Cancer Brother        pancreatic    Heart failure Maternal Grandmother    Heart attack Maternal Grandfather    Heart attack Paternal Grandmother        at 86   Healthy Daughter    Healthy Daughter    Social History   Socioeconomic History   Marital status: Divorced    Spouse name: Not on file   Number of children: 2   Years of education: college    Highest education level: Not on file  Occupational History    Employer: RETIRED  Tobacco Use   Smoking status: Former    Packs/day: 2.50    Years: 20.00    Pack years: 50.00    Types: Cigarettes    Quit date: 06/29/1990    Years since quitting: 31.5    Passive exposure: Never   Smokeless tobacco: Never  Vaping Use   Vaping Use: Never used  Substance and Sexual Activity    Alcohol use: No   Drug use: No   Sexual activity: Not on file  Other Topics Concern   Not on file  Social History Narrative   Not on file   Social Determinants of Health   Financial Resource Strain: Not on file  Food Insecurity: Not on file  Transportation Needs: Not on file  Physical Activity: Not on file  Stress: Not on file  Social Connections: Not on file   Past Surgical History:  Procedure Laterality Date   [place]   CATARACT EXTRACTION Left 09/2019   [place]  2006   to LAD, ATRETIC LIMA AT THAT TIME ALSO   CORONARY ANGIOPLASTY WITH STENT PLACEMENT  2007   STENT TO PROX VG-DIAG, OTHER STENTS PATENT   CORONARY ARTERY BYPASS GRAFT  1998   LIMA-LAD;VG-DIAG & RCA   CORONARY STENT PLACEMENT  2005   TO LAD & LCX-STENTS,   HERNIA REPAIR     TUBAL LIGATION     BTL   Past Surgical History:  Procedure Laterality Date   [place]   CATARACT EXTRACTION Left 09/2019   CHOLECYSTECTOMY  1997   CORONARY ANGIOPLASTY WITH STENT PLACEMENT  2006   to LAD, ATRETIC LIMA AT THAT TIME ALSO   CORONARY ANGIOPLASTY WITH STENT PLACEMENT  2007   STENT TO PROX VG-DIAG, OTHER STENTS PATENT   CORONARY ARTERY BYPASS GRAFT  1998   LIMA-LAD;VG-DIAG & RCA   CORONARY STENT PLACEMENT  2005   TO LAD & LCX-STENTS,   HERNIA REPAIR     TUBAL LIGATION     BTL   Past Medical History:  Diagnosis Date   Abdominal aortic aneurysm (HCC)    Anemia, improved 01/11/2013   Basal cell carcinoma    CAD (coronary artery disease)    hx CABG 1998, last cath 2007 with PCI and stenting to the proximal segment of the vein graft to the diagonal branch. DES Taxus was placed   Cervicalgia    Chest wall pain    Chronic fatigue fibromyalgia syndrome    Chronic pain syndrome    Coronary artery disease    Depression    Fibromyalgia    Fibromyalgia    History of nuclear stress test 08/07/2011   lexiscan;  no evidence of ischemia or infarct; low risk    Hyperlipidemia    Hypertension    Hypertension    Obstructive Bush apnea    OSA (obstructive Bush apnea)    PAD (peripheral artery disease) (HCC)    Pes anserine bursitis    Pulmonary embolism ([place]) 01/11/2013   S/P resection of aortic aneurysm    Trochanteric bursitis    Ulcerative colitis (HCC)    BP 107/68   Pulse 70   Ht '5\' 4"'$  (1.626 m)   Wt 186 lb 9.6 oz (84.6 kg)   SpO2 96%   BMI 32.03 kg/m   Opioid Risk Score:   Fall Risk Score:  `1  Depression screen [facility] 2/9     10/21/2021   11:08 AM 06/24/2021   10:48 AM 04/23/2021   10:41 AM 06/04/2020   11:11 AM 02/02/2020   10:59 AM 08/11/2019    9:31 AM 04/13/2018   10:28 AM  Depression screen PHQ 2/9  Decreased Interest 0 0 0 0 0 0 0  Down, Depressed, Hopeless 0 0 0 0 0 0 0  PHQ - 2 Score 0 0 0 0 0 0 0     Review of Systems  Musculoskeletal:  Positive for back pain.       Right hip pain Right knee pain Bilateral foot pain   All other systems reviewed and are negative.     Objective:   Physical Exam Vitals and nursing note reviewed.  Constitutional:      Appearance: Normal appearance.  Cardiovascular:     Rate and Rhythm: Normal rate and regular rhythm.     Pulses: Normal pulses.     Heart sounds: Normal heart sounds.  Pulmonary:     Effort: Pulmonary effort is normal.     Breath sounds: Normal breath sounds.  Musculoskeletal:     Cervical back: Normal range of motion and neck supple.     Comments: Normal Muscle Bulk and Muscle Testing Reveals:  Upper Extremities: Full ROM and Muscle Strength  5/5 Thoracic Paraspinal Tenderness: T-4-T-6 Lumbar Paraspinal Tenderness: L-4--L-5 Bilateral Greater Trochanter Tenderness Lower Extremities: Full ROM and Muscle Strength 5/5 Arises from Table with ease Narrow Based  Gait     Skin:  General: Skin is warm and dry.  Neurological:     Mental Status: She is alert and oriented to person, place, and time.  Psychiatric:         Mood and Affect: Mood normal.        Behavior: Behavior normal.         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 12/25/2021 2. Fibromyalgia: Continue  Home Exercise Program. Continue to Monitor.12/25/2021 3. Bilateral intermittent hand numbness: Carpal tunnel syndrome versus C6 radiculopathy mild: No complaints today. Continue to Monitor. 12/25/2021 4. Chronic Midline Low Back Pain/ LBP: Refilled: Norco 5/'325mg'$  # 140 pills--use one pill every 6 hours as needed for pain. A second prescription was e-scribe for the following month 12/25/2021. We will continue the opioid monitoring program, this consists of regular clinic visits, examinations, urine drug screen, pill counts as well as use of [place] Controlled Substance Reporting system. A 12 month History has been reviewed on the [place] Controlled Substance Reporting System on 12/25/2021.. 5. Muscle Spasm: Continue current medication regimen with  Robaxin. 12/25/2021 6.Bilateral  Greater Trochanteric Bursitis:Continue to Alternate with heat and ice therapy. Continue current medication regime. 12/25/2021  7. Left Foot pain: No complaints today.Continue with HEP as Tolerated. Continue to Monitor. 12/25/2021 8. Right Lateral Epicondylitis Pain: No complaints Today. Continue HEP as Tolerated. Continue to Monitor. 12/25/2021 9. Right Shoulder Tendonitis:  No complaints today. Continue Current Medication Regimen. Continue to Alternate Ice and Heat Therapy. 12/25/2021 10 Bilateral Chronic Knee Pain: Continue HEP as Tolerated. Continue to Monitor. 12/25/2021   F/U in 2 months

## 2021-12-27 DIAGNOSIS — M25551 Pain in right hip: Secondary | ICD-10-CM | POA: Diagnosis not present

## 2021-12-27 DIAGNOSIS — M6281 Muscle weakness (generalized): Secondary | ICD-10-CM | POA: Diagnosis not present

## 2021-12-27 DIAGNOSIS — M25552 Pain in left hip: Secondary | ICD-10-CM | POA: Diagnosis not present

## 2021-12-27 DIAGNOSIS — M545 Low back pain, unspecified: Secondary | ICD-10-CM | POA: Diagnosis not present

## 2022-01-01 ENCOUNTER — Encounter: Payer: Self-pay | Admitting: Registered Nurse

## 2022-01-01 DIAGNOSIS — M545 Low back pain, unspecified: Secondary | ICD-10-CM | POA: Diagnosis not present

## 2022-01-01 DIAGNOSIS — M6281 Muscle weakness (generalized): Secondary | ICD-10-CM | POA: Diagnosis not present

## 2022-01-01 DIAGNOSIS — M25552 Pain in left hip: Secondary | ICD-10-CM | POA: Diagnosis not present

## 2022-01-01 DIAGNOSIS — M25551 Pain in right hip: Secondary | ICD-10-CM | POA: Diagnosis not present

## 2022-01-03 DIAGNOSIS — M545 Low back pain, unspecified: Secondary | ICD-10-CM | POA: Diagnosis not present

## 2022-01-03 DIAGNOSIS — M25552 Pain in left hip: Secondary | ICD-10-CM | POA: Diagnosis not present

## 2022-01-03 DIAGNOSIS — M6281 Muscle weakness (generalized): Secondary | ICD-10-CM | POA: Diagnosis not present

## 2022-01-03 DIAGNOSIS — M25551 Pain in right hip: Secondary | ICD-10-CM | POA: Diagnosis not present

## 2022-01-08 DIAGNOSIS — I5032 Chronic diastolic (congestive) heart failure: Secondary | ICD-10-CM | POA: Diagnosis not present

## 2022-01-09 ENCOUNTER — Telehealth (HOSPITAL_BASED_OUTPATIENT_CLINIC_OR_DEPARTMENT_OTHER): Payer: Self-pay

## 2022-01-09 LAB — BASIC METABOLIC PANEL
BUN/Creatinine Ratio: 18 (ref 12–28)
BUN: 28 mg/dL — ABNORMAL HIGH (ref 8–27)
CO2: 25 mmol/L (ref 20–29)
Calcium: 9.4 mg/dL (ref 8.7–10.3)
Chloride: 101 mmol/L (ref 96–106)
Creatinine, Ser: 1.53 mg/dL — ABNORMAL HIGH (ref 0.57–1.00)
Glucose: 110 mg/dL — ABNORMAL HIGH (ref 70–99)
Potassium: 4.4 mmol/L (ref 3.5–5.2)
Sodium: 143 mmol/L (ref 134–144)
eGFR: 34 mL/min/{1.73_m2} — ABNORMAL LOW (ref >59)

## 2022-01-09 NOTE — Telephone Encounter (Addendum)
Results called to patient who verbalizes understanding!     ----- Message from Loel Dubonnet, NP sent at 01/09/2022  7:36 AM EDT ----- Kidney function overall stable. Continue current medications.

## 2022-01-10 DIAGNOSIS — M6281 Muscle weakness (generalized): Secondary | ICD-10-CM | POA: Diagnosis not present

## 2022-01-10 DIAGNOSIS — M25552 Pain in left hip: Secondary | ICD-10-CM | POA: Diagnosis not present

## 2022-01-10 DIAGNOSIS — M545 Low back pain, unspecified: Secondary | ICD-10-CM | POA: Diagnosis not present

## 2022-01-10 DIAGNOSIS — M25551 Pain in right hip: Secondary | ICD-10-CM | POA: Diagnosis not present

## 2022-01-10 DIAGNOSIS — G4733 Obstructive sleep apnea (adult) (pediatric): Secondary | ICD-10-CM | POA: Diagnosis not present

## 2022-01-15 DIAGNOSIS — M25551 Pain in right hip: Secondary | ICD-10-CM | POA: Diagnosis not present

## 2022-01-15 DIAGNOSIS — M545 Low back pain, unspecified: Secondary | ICD-10-CM | POA: Diagnosis not present

## 2022-01-15 DIAGNOSIS — M6281 Muscle weakness (generalized): Secondary | ICD-10-CM | POA: Diagnosis not present

## 2022-01-15 DIAGNOSIS — M25552 Pain in left hip: Secondary | ICD-10-CM | POA: Diagnosis not present

## 2022-01-16 DIAGNOSIS — G4733 Obstructive sleep apnea (adult) (pediatric): Secondary | ICD-10-CM | POA: Diagnosis not present

## 2022-01-17 DIAGNOSIS — M25551 Pain in right hip: Secondary | ICD-10-CM | POA: Diagnosis not present

## 2022-01-17 DIAGNOSIS — M545 Low back pain, unspecified: Secondary | ICD-10-CM | POA: Diagnosis not present

## 2022-01-17 DIAGNOSIS — M6281 Muscle weakness (generalized): Secondary | ICD-10-CM | POA: Diagnosis not present

## 2022-01-17 DIAGNOSIS — M25552 Pain in left hip: Secondary | ICD-10-CM | POA: Diagnosis not present

## 2022-01-22 DIAGNOSIS — H02052 Trichiasis without entropian right lower eyelid: Secondary | ICD-10-CM | POA: Diagnosis not present

## 2022-01-22 DIAGNOSIS — H5711 Ocular pain, right eye: Secondary | ICD-10-CM | POA: Diagnosis not present

## 2022-01-22 DIAGNOSIS — I10 Essential (primary) hypertension: Secondary | ICD-10-CM | POA: Diagnosis not present

## 2022-02-09 DIAGNOSIS — G4733 Obstructive sleep apnea (adult) (pediatric): Secondary | ICD-10-CM | POA: Diagnosis not present

## 2022-02-10 ENCOUNTER — Telehealth: Payer: Self-pay | Admitting: Internal Medicine

## 2022-02-10 NOTE — Telephone Encounter (Signed)
Returned call to patient of Dr. Debara Pickett who reports being in AFib since yesterday - has known PAF. She reports the AFib is not bothersome, she can just feel when in AFib. Her Apple Watch indicated AFib. She has NO chest pain, shortness of breath. She had pains about 1 week ago that lasted about 1 hour but she didn't call about this and was not overly concerned. She reports similar episode 1 year ago when her AFib was not rate-controlled - went to hospital. Her current BP 120-130/70s. Her current HR is in the 70s. She is compliant with medications. She reports ankle edema, slight weight gain, and takes PRN lasix and symptoms resolve.   No acute issues at present. Advised would sent to MD to review as she reports her AFib episodes don't generally last this long

## 2022-02-10 NOTE — Telephone Encounter (Signed)
Patient c/o Palpitations:  High priority if patient c/o lightheadedness, shortness of breath, or chest pain  How long have you had palpitations/irregular HR/ Afib? Are you having the symptoms now?   Just irregular heat beat  Are you currently experiencing lightheadedness, SOB or CP? No  Do you have a history of afib (atrial fibrillation) or irregular heart rhythm?   Have you checked your BP or HR? (document readings if available):   BP 150/80  HR - 70 something  Are you experiencing any other symptoms? No   Patient stated that her Apple watch told her she was in Afib since yesterday.  Usually it comes and goes but since yesterday it has stayed consistent in Afib.

## 2022-02-11 NOTE — Telephone Encounter (Signed)
Pixie Casino, MD  Fidel Levy, RN Caller: Unspecified (Yesterday,  4:04 PM) THx - if she persists in afib for a few days and does not feel well, we may need to set up a cardioversion.        Previous Messages

## 2022-02-11 NOTE — Telephone Encounter (Signed)
Spoke with patient. She thinks her AFib is breaking. Her Apple Watch is not indicated AFib but notes irregularities. She only reports some slight dizziness. No BP or HR issues. She is taking it easy. Advised to monitor symptoms. If things persists into tomorrow afternoon, Thursday AM - call us back to schedule sooner APP visit. She verbalized understanding

## 2022-02-19 ENCOUNTER — Encounter: Payer: PPO | Attending: Registered Nurse | Admitting: Registered Nurse

## 2022-02-19 ENCOUNTER — Encounter: Payer: Self-pay | Admitting: Registered Nurse

## 2022-02-19 VITALS — BP 106/65 | HR 74 | Ht 64.0 in | Wt 190.0 lb

## 2022-02-19 DIAGNOSIS — M25561 Pain in right knee: Secondary | ICD-10-CM | POA: Insufficient documentation

## 2022-02-19 DIAGNOSIS — M545 Low back pain, unspecified: Secondary | ICD-10-CM | POA: Diagnosis not present

## 2022-02-19 DIAGNOSIS — M7062 Trochanteric bursitis, left hip: Secondary | ICD-10-CM | POA: Diagnosis not present

## 2022-02-19 DIAGNOSIS — M797 Fibromyalgia: Secondary | ICD-10-CM | POA: Insufficient documentation

## 2022-02-19 DIAGNOSIS — G894 Chronic pain syndrome: Secondary | ICD-10-CM | POA: Insufficient documentation

## 2022-02-19 DIAGNOSIS — G8929 Other chronic pain: Secondary | ICD-10-CM | POA: Diagnosis not present

## 2022-02-19 DIAGNOSIS — Z79891 Long term (current) use of opiate analgesic: Secondary | ICD-10-CM | POA: Diagnosis not present

## 2022-02-19 DIAGNOSIS — M7061 Trochanteric bursitis, right hip: Secondary | ICD-10-CM | POA: Insufficient documentation

## 2022-02-19 DIAGNOSIS — Z5181 Encounter for therapeutic drug level monitoring: Secondary | ICD-10-CM | POA: Diagnosis not present

## 2022-02-19 DIAGNOSIS — M25562 Pain in left knee: Secondary | ICD-10-CM | POA: Insufficient documentation

## 2022-02-19 MED ORDER — HYDROCODONE-ACETAMINOPHEN 5-325 MG PO TABS: 1.0000 | ORAL_TABLET | Freq: Four times a day (QID) | ORAL | 0 refills | Status: DC | PRN

## 2022-02-19 NOTE — Progress Notes (Signed)
Subjective:    Patient ID: Kara Bush, female    DOB: 1941/04/17, 81 y.o.   MRN: [id number]  HPI: Kara Bush is a 81 y.o. female who returns for follow up appointment for chronic pain and medication refill. She states her pain is located in her upper- lower back pain, bilateral hip pain. And bilateral knee pain.She  rates her pain 4. Her current exercise regime is walking and performing stretching exercises.  Kara Bush Morphine equivalent is 27.00 MME.   Last UDS was Performed on 10/21/2021, it was consistent.    Pain Inventory Average Pain 4 Pain Right Now 4 My pain is constant and aching  In the last 24 hours, has pain interfered with the following? General activity 2 Relation with others 0 Enjoyment of life 2 What TIME of day is your pain at its worst? morning  Sleep (in general) Good  Pain is worse with: bending and some activites Pain improves with: rest and medication Relief from Meds: 5  Family History  Problem Relation Age of Onset   Heart disease Father    Heart attack Father        @ 2   Cancer Paternal Aunt        BREAST   Heart failure Mother        at 39   Healthy Sister    Cancer Brother        pancreatic    Heart failure Maternal Grandmother    Heart attack Maternal Grandfather    Heart attack Paternal Grandmother        at 69   Healthy Daughter    Healthy Daughter    Social History   Socioeconomic History   Marital status: Divorced    Spouse name: Not on file   Number of children: 2   Years of education: college    Highest education level: Not on file  Occupational History    Employer: RETIRED  Tobacco Use   Smoking status: Former    Packs/day: 2.50    Years: 20.00    Total pack years: 50.00    Types: Cigarettes    Quit date: 06/29/1990    Years since quitting: 31.6    Passive exposure: Never   Smokeless tobacco: Never  Vaping Use   Vaping Use: Never used  Substance and Sexual Activity   Alcohol use: No   Drug  use: No   Sexual activity: Not on file  Other Topics Concern   Not on file  Social History Narrative   Not on file   Social Determinants of Health   Financial Resource Strain: Not on file  Food Insecurity: Not on file  Transportation Needs: Not on file  Physical Activity: Not on file  Stress: Not on file  Social Connections: Not on file   Past Surgical History:  Procedure Laterality Date   [place]   CATARACT EXTRACTION Left 09/2019   [place]  2006   to LAD, ATRETIC LIMA AT THAT TIME ALSO   CORONARY ANGIOPLASTY WITH STENT PLACEMENT  2007   STENT TO PROX VG-DIAG, OTHER STENTS PATENT   CORONARY ARTERY BYPASS GRAFT  1998   LIMA-LAD;VG-DIAG & RCA   CORONARY STENT PLACEMENT  2005   TO LAD & LCX-STENTS,   HERNIA REPAIR     TUBAL LIGATION     BTL   Past Surgical History:  Procedure Laterality Date   AORTOBIEXTERNAL  ILIAC BYPASS  1991   CATARACT EXTRACTION Left 09/2019   CHOLECYSTECTOMY  1997   CORONARY ANGIOPLASTY WITH STENT PLACEMENT  2006   to LAD, ATRETIC LIMA AT THAT TIME ALSO   CORONARY ANGIOPLASTY WITH STENT PLACEMENT  2007   STENT TO PROX VG-DIAG, OTHER STENTS PATENT   CORONARY ARTERY BYPASS GRAFT  1998   LIMA-LAD;VG-DIAG & RCA   CORONARY STENT PLACEMENT  2005   TO LAD & LCX-STENTS,   HERNIA REPAIR     TUBAL LIGATION     BTL   Past Medical History:  Diagnosis Date   Abdominal aortic aneurysm (HCC)    Anemia, improved 01/11/2013   Basal cell carcinoma    CAD (coronary artery disease)    hx CABG 1998, last cath 2007 with PCI and stenting to the proximal segment of the vein graft to the diagonal branch. DES Taxus was placed   Cervicalgia    Chest wall pain    Chronic fatigue fibromyalgia syndrome    Chronic pain syndrome    Coronary artery disease    Depression    Fibromyalgia    Fibromyalgia    History of nuclear stress test 08/07/2011   lexiscan; no evidence of ischemia or  infarct; low risk    Hyperlipidemia    Hypertension    Hypertension    Obstructive sleep apnea    OSA (obstructive sleep apnea)    PAD (peripheral artery disease) (HCC)    Pes anserine bursitis    Pulmonary embolism ([place]) 01/11/2013   S/P resection of aortic aneurysm    Trochanteric bursitis    Ulcerative colitis (HCC)    BP 106/65   Pulse 74   Ht '5\' 4"'$  (1.626 m)   Wt 190 lb (86.2 kg)   SpO2 96%   BMI 32.61 kg/m   Opioid Risk Score:   Fall Risk Score:  `1  Depression screen [facility] 2/9     10/21/2021   11:08 AM 06/24/2021   10:48 AM 04/23/2021   10:41 AM 06/04/2020   11:11 AM 02/02/2020   10:59 AM 08/11/2019    9:31 AM 04/13/2018   10:28 AM  Depression screen PHQ 2/9  Decreased Interest 0 0 0 0 0 0 0  Down, Depressed, Hopeless 0 0 0 0 0 0 0  PHQ - 2 Score 0 0 0 0 0 0 0    Review of Systems  Musculoskeletal:  Positive for back pain.       Pain in both knees, right foot pain  All other systems reviewed and are negative.      Objective:   Physical Exam Vitals and nursing note reviewed.  Constitutional:      Appearance: Normal appearance.  Cardiovascular:     Rate and Rhythm: Normal rate and regular rhythm.     Pulses: Normal pulses.     Heart sounds: Normal heart sounds.  Pulmonary:     Effort: Pulmonary effort is normal.     Breath sounds: Normal breath sounds.  Musculoskeletal:     Cervical back: Normal range of motion and neck supple.     Comments: Normal Muscle Bulk and Muscle Testing Reveals:  Upper Extremities: Full ROM and Muscle Strength 5/5 Thoracic Paraspinal Tenderness: T-7-T-9 Mainly Right Side Lumbar Paraspinal Tenderness: L-4-L-5 Bilateral Greater Trochanter Tenderness Lower Extremities: Full ROM and Muscle Strength 5/5 Arises from Table with Ease Narrow Based  Gait     Skin:    General: Skin is warm and dry.  Neurological:  Mental Status: She is alert and oriented to person, place, and time.  Psychiatric:        Mood and Affect: Mood  normal.        Behavior: Behavior normal.         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 02/19/2022 2. Fibromyalgia: Continue  Home Exercise Program. Continue to Monitor.02/19/2022 3. Bilateral intermittent hand numbness: Carpal tunnel syndrome versus C6 radiculopathy mild: No complaints today. Continue to Monitor. 02/19/2022 4. Chronic Midline Low Back Pain/ LBP: Refilled: Norco 5/'325mg'$  # 140 pills--use one pill every 6 hours as needed for pain. A second prescription was e-scribe for the following month 02/19/2022. We will continue the opioid monitoring program, this consists of regular clinic visits, examinations, urine drug screen, pill counts as well as use of [place] Controlled Substance Reporting system. A 12 month History has been reviewed on the [place] Controlled Substance Reporting System on 02/19/2022.. 5. Muscle Spasm: Continue current medication regimen with  Robaxin. 02/19/2022 6.Bilateral  Greater Trochanteric Bursitis:Continue to Alternate with heat and ice therapy. Continue current medication regime. 02/19/2022  7. Left Foot pain: No complaints today.Continue with HEP as Tolerated. Continue to Monitor. 02/19/2022 8. Right Lateral Epicondylitis Pain: No complaints Today. Continue HEP as Tolerated. Continue to Monitor. 02/19/2022 9. Right Shoulder Tendonitis:  No complaints today. Continue Current Medication Regimen. Continue to Alternate Ice and Heat Therapy. 02/19/2022 10 Bilateral Chronic Knee Pain: Continue HEP as Tolerated. Continue to Monitor. 02/19/2022   F/U in 2 months

## 2022-02-27 DIAGNOSIS — H5789 Other specified disorders of eye and adnexa: Secondary | ICD-10-CM | POA: Diagnosis not present

## 2022-03-05 DIAGNOSIS — H02052 Trichiasis without entropian right lower eyelid: Secondary | ICD-10-CM | POA: Diagnosis not present

## 2022-03-05 DIAGNOSIS — H5711 Ocular pain, right eye: Secondary | ICD-10-CM | POA: Diagnosis not present

## 2022-03-12 DIAGNOSIS — G4733 Obstructive sleep apnea (adult) (pediatric): Secondary | ICD-10-CM | POA: Diagnosis not present

## 2022-04-02 ENCOUNTER — Encounter: Payer: Self-pay | Admitting: Internal Medicine

## 2022-04-02 ENCOUNTER — Ambulatory Visit: Payer: PPO | Attending: Internal Medicine | Admitting: Internal Medicine

## 2022-04-02 VITALS — BP 130/76 | HR 78 | Ht 64.0 in | Wt 190.0 lb

## 2022-04-02 DIAGNOSIS — I480 Paroxysmal atrial fibrillation: Secondary | ICD-10-CM | POA: Diagnosis not present

## 2022-04-02 DIAGNOSIS — Z951 Presence of aortocoronary bypass graft: Secondary | ICD-10-CM

## 2022-04-02 DIAGNOSIS — I712 Thoracic aortic aneurysm, without rupture, unspecified: Secondary | ICD-10-CM | POA: Diagnosis not present

## 2022-04-02 DIAGNOSIS — I350 Nonrheumatic aortic (valve) stenosis: Secondary | ICD-10-CM | POA: Diagnosis not present

## 2022-04-02 DIAGNOSIS — I739 Peripheral vascular disease, unspecified: Secondary | ICD-10-CM | POA: Diagnosis not present

## 2022-04-02 NOTE — Patient Instructions (Signed)
Medication Instructions:  NO CHANGES  *If you need a refill on your cardiac medications before your next appointment, please call your pharmacy*   Testing/Procedures: Dr. Debara Pickett has ordered a lower extremity arterial doppler   Follow-Up: At [facility], you and your health needs are our priority.  As part of our continuing mission to provide you with exceptional heart care, we have created designated Provider Care Teams.  These Care Teams include your primary Cardiologist (physician) and Advanced Practice Providers (APPs -  Physician Assistants and Nurse Practitioners) who all work together to provide you with the care you need, when you need it.  We recommend signing up for the patient portal called "MyChart".  Sign up information is provided on this After Visit Summary.  MyChart is used to connect with patients for Virtual Visits (Telemedicine).  Patients are able to view lab/test results, encounter notes, upcoming appointments, etc.  Non-urgent messages can be sent to your provider as well.   To learn more about what you can do with MyChart, go to [email or web address].    Your next appointment:   6 month(s)  The format for your next appointment:   In Person  Provider:   Pixie Casino, MD

## 2022-04-02 NOTE — Progress Notes (Signed)
04/02/2022   PCP: Aretta Nip, MD  CC: Shortness of breath  HPI: 81 year old white female with a history of coronary artery disease as well as peripheral vascular disease. She had bypass grafting in 1998 and stents in 2005 2006 and 2000 710 negative nuclear stress test in 2013 she also has peripheral arterial disease and history of aortic iliac bypass in 1991. Last ABIs were made of 2013 were normal.  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  Fortunately she was asymptomatic, she was placed on anticoagulation with the Xarelto.   She has done well since that time.  Her last visit with Dr. Debara Pickett was in February and he was planning to recheck d-dimer and if it was still elevated she would need to continue her Xarelto for total of 6 months.  Her d-dimer continues to be elevated at 0.67. Initial d-dimer in February was 1.48.  Venous Dopplers were also done in February which were negative for DVT.    Kara Bush just had a repeat D. dimer which was elevated at 1.48, actually slightly higher than it had been before. She tells a story that over the past several weeks she's had a urinary tract infection and started taking Bactrim for which she is more nauseated and is actually thrown up. She reports some tenderness across her epigastric area as well as the costovertebral angles. She's also had fever, chills and some sweating at night. She is planning on seeing her primary care doctor back in the office this afternoon. She denies any worsening shortness of breath or chest pain. He's had no bleeding complications on Xarelto.  Kara Bush returns today for follow-up. She recently reports some shortness of breath with exertion. She occasionally gets twinges in her chest however it is not reminiscent of her prior angina. Nevertheless her last stress test was 2 years ago. I think a lot of her  shortness of breath is due to weight gain and minimal exercise. She does water exercise but not water aerobics.  I saw Kara Bush today in follow-up of her stress test and lower extremity Dopplers. Unfortunately she did not get the lower extremity Dopplers. She did undergo a metabolic stress testing. This showed no circulatory limitation to exercise. The main factors in her shortness of breath seems to be obesity and some pulmonary restriction. Currently she denies any significant leg claudication.  07/03/2106  Kara Bush returns today for follow-up. Overall she's feeling fairly well. She had lower chimney arterial Dopplers a year ago which showed normal ABIs. She gets some occasional pain, burning and itching in her extremities which may be related to fibromyalgia. She denies any worsening chest pain or shortness of breath with exertion.  08/10/2017  Kara Bush was seen today in follow-up.  She says she is fairly stable with regards to her symptoms compared to last year.  She gets short of breath, when doing moderate to heavy exertion.  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  She had slight worsening of her right lower extremity ABI last year but denies any worsening claudication with exertion.  Blood pressures been well controlled at home.  She has a physical with routine blood work in 3 months.  08/17/2018  Kara Bush returns today for routine follow-up.  Is been a year since I last  saw her.  She had an echocardiogram which showed a stable dilated aortic root.  We had planned to repeat CT angiography of the aorta around this time.  She also had had lower extremity Dopplers but denies any worsening claudication.  This showed mild bilateral lower extremity arterial disease.  She reports continued exercise and does do water aerobics.  She struggles with fibromyalgia and is on long-term opiate therapy.  Labs from March 2019 showed  total cholesterol 141, HDL 50, LDL 64 and triglycerides 134.  09/09/2018  Kara Bush is seen today as an add-on for new A. fib.  I just saw her a couple weeks ago.  At the time she was in sinus rhythm and doing well.  Today she was in for Dopplers and was noted to be in an irregular rhythm.  EKG was performed which shows newly identified atrial fibrillation with controlled ventricular response at 81.  Subsequently she reported that she has been having some episodes of palpitations on and off however it was not clear whether this could have been related to her fibromyalgia or any other disorder.  She is also had a few episodes of presyncope.  06/23/2019  Kara Bush returns today for follow-up of A. fib.  Overall she is feeling well.  Her EKG today shows in sinus rhythm.  She has been tolerating Xarelto however the cost is concerning for her.  She is interested in something else.  We discussed possibly switching over to Eliquis.  Recently she held the [place] prior to colonoscopy which was uneventful and then it was restarted.  She is also noted elevated blood pressures recently.  She is only on low-dose lisinopril.  08/17/2019  Kara Bush is seen today in follow-up.  I last saw her about a couple months ago.  She returns today for follow-up of blood pressure.  I previously increased her lisinopril however she remains hypertensive.  She is done well after transitioning from Xarelto to Eliquis.  She is currently only on low-dose lisinopril 10 mg daily.  04/06/2020  Kara Bush returns today for follow-up.  Overall she is doing well without any specific complaints.  LDL was 63 as of March 2011.  Blood pressure today is 140/72.  She is demonstrating sinus rhythm with first-degree AV block and some PACs and PVCs on her EKG but no evidence of A. fib.  She is on Eliquis and denies any bleeding difficulty.  She does get some occasional breakthrough palpitations.  She is only on low-dose  metoprolol.  04/02/2022  Kara Bush returns today for follow-up.  Overall she says she is doing well.  She denies any chest pain but does note she gets short of breath with minimal exertion.  It sounds like she is fairly sedentary.  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  She will have these annually.  She reports on her Apple Watch that she has paroxysmal atrial fibrillation.  I did review this and showed some episodes of A-fib although her EKG today shows sinus rhythm with PACs.  She brought a blood pressure log with her which shows relatively good control of her blood pressures generally around the 130s over 70s.  Blood pressure today was 130/76.  She has not had follow-up lower extremity arterial Dopplers and does have a history of aorto ileal bypass in the past.  She was sent to vascular surgery however they did not follow-up  with her recently.     Allergies  Allergen Reactions   Biotin Hives   Lisinopril Cough   Penicillin G     Other reaction(s): rash   Atenolol Other (See Comments)    colitis Other reaction(s): Bowel issues   Duloxetine Hcl Other (See Comments)    Mad pt feel spacey Other reaction(s): shaky; tired in the PMs   Lyrica [Pregabalin] Other (See Comments)    Made pt feel spacey   Penicillins Rash   Sulfamethoxazole-Trimethoprim Nausea And Vomiting    Other reaction(s): elevated liver functions    Current Outpatient Medications  Medication Sig Dispense Refill   amLODipine (NORVASC) 5 MG tablet Take 1 tablet by mouth daily.     apixaban (ELIQUIS) 5 MG TABS tablet Take 1 tablet by mouth 2 (two) times daily.     b complex vitamins capsule Take 1 capsule by mouth daily.     betamethasone dipropionate 0.48 % cream 1 application     citalopram (CELEXA) 20 MG tablet Take 1 tablet by mouth daily.     clobetasol ointment (TEMOVATE) 0.05 % APPLY THIN LAYER TO  AFFECTED AREAS EXTERNALLY TWO TO THREE TIMES WEEKLY AS NEEDED 90 DAYS     ezetimibe (ZETIA) 10 MG tablet Take 1 tablet by mouth daily.     furosemide (LASIX) 40 MG tablet Take 1 tablet (40 mg total) by mouth as needed for fluid or edema (Daily as needed for swelling or weight gain of 2 pounds overnight or 5 pounds in a week). Take one tablet by mouth daily for 4 days 90 tablet 3   HYDROcodone-acetaminophen (NORCO/VICODIN) 5-325 MG tablet Take 1 tablet by mouth 4 (four) times daily as needed for moderate pain. May Take an extra 0.5 to one tablet when pain is severe. 140 tablet 0   methocarbamol (ROBAXIN) 500 MG tablet Take 1 tablet by mouth every 8 (eight) hours as needed.     metoprolol succinate (TOPROL-XL) 25 MG 24 hr tablet Take 1 tablet by mouth daily.     metoprolol succinate (TOPROL-XL) 50 MG 24 hr tablet TAKE ONE TABLET BY MOUTH ONCE DAILY 90 tablet 1   Multiple Vitamin (MULITIVITAMIN WITH MINERALS) TABS Take 1 tablet by mouth daily.     NEOMYCIN-POLYMYXIN-DEXAMETH OP INSTILL 1 DROP(S) IN THE AFFECTED EYE 4 TIME(S) PER DAY X 5 DAYS     ondansetron (ZOFRAN) 4 MG tablet Take 1 tablet by mouth every 6 (six) hours as needed.     potassium chloride (KLOR-CON M) 10 MEQ tablet Take 1 tablet (10 mEq total) by mouth as needed (please take when you need take your lasix). 90 tablet 3   sacubitril-valsartan (ENTRESTO) 97-103 MG Take 1 tablet by mouth 2 (two) times daily. 180 tablet 1   simvastatin (ZOCOR) 40 MG tablet Take 1 tablet by mouth daily.     valsartan (DIOVAN) 160 MG tablet Take 1 tablet by mouth daily.     valsartan (DIOVAN) 80 MG tablet Take 1 tablet by mouth daily.     zolpidem (AMBIEN CR) 12.5 MG CR tablet Take 12.5 mg by mouth at bedtime as needed.     No current facility-administered medications for this visit.    Past Medical History:  Diagnosis Date   Abdominal aortic aneurysm ([place])    Anemia, improved 01/11/2013   Basal cell carcinoma    CAD (coronary artery disease)    hx CABG  1998, last cath 2007 with PCI and stenting to the proximal segment of the vein  graft to the diagonal branch. DES Taxus was placed   Cervicalgia    Chest wall pain    Chronic fatigue fibromyalgia syndrome    Chronic pain syndrome    Coronary artery disease    Depression    Fibromyalgia    Fibromyalgia    History of nuclear stress test 08/07/2011   lexiscan; no evidence of ischemia or infarct; low risk    Hyperlipidemia    Hypertension    Hypertension    Obstructive sleep apnea    OSA (obstructive sleep apnea)    PAD (peripheral artery disease) (HCC)    Pes anserine bursitis    Pulmonary embolism ([place]) 01/11/2013   S/P resection of aortic aneurysm    Trochanteric bursitis    Ulcerative colitis ([place])     Past Surgical History:  Procedure Laterality Date   AORTOBIEXTERNAL ILIAC BYPASS  1991   CATARACT EXTRACTION Left 09/2019   CHOLECYSTECTOMY  1997   CORONARY ANGIOPLASTY WITH STENT PLACEMENT  2006   to LAD, ATRETIC LIMA AT THAT TIME ALSO   CORONARY ANGIOPLASTY WITH STENT PLACEMENT  2007   STENT TO PROX VG-DIAG, OTHER STENTS PATENT   CORONARY ARTERY BYPASS GRAFT  1998   LIMA-LAD;VG-DIAG & RCA   CORONARY STENT PLACEMENT  2005   TO LAD & LCX-STENTS,   HERNIA REPAIR     TUBAL LIGATION     BTL   EKG: Sinus rhythm with PACs and first-degree AV block at 78-personally reviewed  ROS: Pertinent items noted in HPI and remainder of comprehensive ROS otherwise negative.  PHYSICAL EXAM BP 130/76   Pulse 78   Ht '5\' 4"'$  (1.626 m)   Wt 190 lb (86.2 kg)   SpO2 98%   BMI 32.61 kg/m  General appearance: alert, no distress, and moderately obese Neck: no carotid bruit and no JVD Lungs: clear to auscultation bilaterally Heart: irregularly irregular rhythm and systolic murmur: systolic ejection 3/6, crescendo at 2nd right intercostal space Abdomen: soft, non-tender; bowel sounds normal; no masses,  no organomegaly Extremities: extremities normal, atraumatic, no cyanosis or edema Pulses:  2+ and symmetric Skin: Skin color, texture, turgor normal. No rashes or lesions Neurologic: Grossly normal Psych: Pleasant  ASSESSMENT AND PLAN Patient Active Problem List   Diagnosis Date Noted   A-fib ([place]) 02/26/2021   Atrial fibrillation ([place]) 09/09/2018   DOE (dyspnea on exertion) 08/10/2017   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   Greater trochanteric bursitis of right hip 11/07/2014   Sacro-iliac pain 11/07/2014   Chronic midline low back pain without sciatica 11/07/2013   Hepatitis 04/20/2013   Screening for STD (sexually transmitted disease) 04/20/2013   Recurrent UTI 04/20/2013   Abdominal aortic aneurysm (HCC)    Ulcerative colitis ([place])    Obstructive sleep apnea    Fibromyalgia    Coronary artery disease    Basal cell carcinoma    Anemia, improved 01/11/2013   Ascending aortic aneurysm, 4.1 cm 01/11/2013   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     Hypertension    PLAN: Kara Bush has had  some shortness of breath with exertion but it sounds like she is fairly sedentary.  She denies any chest pain.  She does have moderate aortic stenosis on echo and on exam it does not sound severe.  I am not certain that this would be causing her her symptoms.  She reports paroxysmal A-fib but cannot link up fatigue or shortness of breath that seems to associate with episodes of A-fib.  She is in sinus rhythm today.  She is anticoagulated.  She has not had any recent lab work to look for anemia but did have a cholesterol test recently which showed good cholesterol control.  She is  overdue for lower extremity arterial Dopplers given her history of lower extremity aortoiliac bypass.  We will go ahead and repeat that.  She was also noted to have a sending aortic aneurysm measuring 4.1 cm which has been stable.  Plan follow-up with me in 6 months or sooner as necessary.  Pixie Casino, MD, [facility], [place] Director of the [facility]  [facility] Diplomate of the American Board of Clinical Lipidology Attending Cardiologist  Direct Dial: [phone]  Fax: [phone]  Website:  [email or web address]

## 2022-04-08 ENCOUNTER — Other Ambulatory Visit (HOSPITAL_COMMUNITY): Payer: Self-pay | Admitting: Internal Medicine

## 2022-04-08 DIAGNOSIS — I739 Peripheral vascular disease, unspecified: Secondary | ICD-10-CM

## 2022-04-09 ENCOUNTER — Ambulatory Visit (HOSPITAL_BASED_OUTPATIENT_CLINIC_OR_DEPARTMENT_OTHER)
Admission: RE | Admit: 2022-04-09 | Discharge: 2022-04-09 | Disposition: A | Discharge status: Home / Self Care | Payer: PPO | Source: Ambulatory Visit | Attending: Cardiology | Admitting: Cardiology

## 2022-04-09 ENCOUNTER — Ambulatory Visit (HOSPITAL_COMMUNITY)
Admission: RE | Admit: 2022-04-09 | Discharge: 2022-04-09 | Disposition: A | Discharge status: Home / Self Care | Payer: PPO | Source: Ambulatory Visit | Attending: Cardiology | Admitting: Cardiology

## 2022-04-09 DIAGNOSIS — I739 Peripheral vascular disease, unspecified: Secondary | ICD-10-CM | POA: Insufficient documentation

## 2022-04-09 DIAGNOSIS — Z95820 Peripheral vascular angioplasty status with implants and grafts: Secondary | ICD-10-CM

## 2022-04-10 DIAGNOSIS — N813 Complete uterovaginal prolapse: Secondary | ICD-10-CM | POA: Diagnosis not present

## 2022-04-10 DIAGNOSIS — N904 Leukoplakia of vulva: Secondary | ICD-10-CM | POA: Diagnosis not present

## 2022-04-12 DIAGNOSIS — G4733 Obstructive sleep apnea (adult) (pediatric): Secondary | ICD-10-CM | POA: Diagnosis not present

## 2022-04-13 ENCOUNTER — Encounter (HOSPITAL_COMMUNITY): Payer: Self-pay | Admitting: Emergency Medicine

## 2022-04-13 ENCOUNTER — Other Ambulatory Visit: Payer: Self-pay

## 2022-04-13 ENCOUNTER — Emergency Department (HOSPITAL_COMMUNITY)
Admission: EM | Admit: 2022-04-13 | Discharge: 2022-04-14 | Discharge status: Against Medical Advice | Payer: PPO | Attending: Emergency Medicine | Admitting: Emergency Medicine

## 2022-04-13 ENCOUNTER — Emergency Department (HOSPITAL_COMMUNITY): Payer: PPO

## 2022-04-13 DIAGNOSIS — R0789 Other chest pain: Secondary | ICD-10-CM | POA: Insufficient documentation

## 2022-04-13 DIAGNOSIS — R0602 Shortness of breath: Secondary | ICD-10-CM | POA: Insufficient documentation

## 2022-04-13 DIAGNOSIS — Z5321 Procedure and treatment not carried out due to patient leaving prior to being seen by health care provider: Secondary | ICD-10-CM | POA: Diagnosis not present

## 2022-04-13 DIAGNOSIS — R079 Chest pain, unspecified: Secondary | ICD-10-CM | POA: Diagnosis not present

## 2022-04-13 LAB — CBC
HCT: 39.6 % (ref 36.0–46.0)
Hemoglobin: 12 g/dL (ref 12.0–15.0)
MCH: 25.1 pg — ABNORMAL LOW (ref 26.0–34.0)
MCHC: 30.3 g/dL (ref 30.0–36.0)
MCV: 82.7 fL (ref 80.0–100.0)
Platelets: 229 10*3/uL (ref 150–400)
RBC: 4.79 MIL/uL (ref 3.87–5.11)
RDW: 13.7 % (ref 11.5–15.5)
WBC: 9.1 10*3/uL (ref 4.0–10.5)
nRBC: 0 % (ref 0.0–0.2)

## 2022-04-13 LAB — TROPONIN I (HIGH SENSITIVITY): Troponin I (High Sensitivity): 5 ng/L (ref <18)

## 2022-04-13 LAB — BASIC METABOLIC PANEL
Anion gap: 6 (ref 5–15)
BUN: 23 mg/dL (ref 8–23)
CO2: 25 mmol/L (ref 22–32)
Calcium: 9.4 mg/dL (ref 8.9–10.3)
Chloride: 110 mmol/L (ref 98–111)
Creatinine, Ser: 1.3 mg/dL — ABNORMAL HIGH (ref 0.44–1.00)
GFR, Estimated: 41 mL/min — ABNORMAL LOW (ref >60)
Glucose, Bld: 134 mg/dL — ABNORMAL HIGH (ref 70–99)
Potassium: 4 mmol/L (ref 3.5–5.1)
Sodium: 141 mmol/L (ref 135–145)

## 2022-04-13 NOTE — ED Provider Triage Note (Signed)
Emergency Medicine Provider Triage Evaluation Note  Kara Bush , a 81 y.o. female  was evaluated in triage.  Pt complains of chest pain.  Patient has a history of open heart surgery, coronary artery stents, aortic replacement.  Follows with Dr. Debara Pickett.  She states that around 7 PM she developed tightness in her chest that comes and goes.  It does not radiate.  No associated vomiting or diaphoresis.  Symptoms started at rest.  Mild shortness of breath associated.  No abdominal pain or back pain.  No extremity symptoms.  Review of Systems  Positive: Chest pain Negative: Diaphoresis, vomiting  Physical Exam  BP (!) 173/79   Pulse 84   Temp 98.2 F (36.8 C) (Oral)   Resp 17   Ht '5\' 4"'$  (1.626 m)   Wt 88 kg   SpO2 100%   BMI 33.30 kg/m  Gen:   Awake, no distress   Resp:  Normal effort  MSK:   Moves extremities without difficulty  Other:  Heart rhythm regular, no murmur  Medical Decision Making  Medically screening exam initiated at 8:11 PM.  Appropriate orders placed.  Kara Bush was informed that the remainder of the evaluation will be completed by another provider, this initial triage assessment does not replace that evaluation, and the importance of remaining in the ED until their evaluation is complete.     Carlisle Cater, PA-C 04/13/22 2020

## 2022-04-13 NOTE — ED Notes (Signed)
Unable to collect repeat troponin. PT did not answer in lobby

## 2022-04-13 NOTE — ED Triage Notes (Signed)
Patient c/o chest tightness with SOB since 1900.

## 2022-04-14 ENCOUNTER — Telehealth: Payer: Self-pay | Admitting: Internal Medicine

## 2022-04-14 NOTE — Telephone Encounter (Signed)
Spoke with patient she states she had a cramping on and off that started around 7pm. She went to the ED at 8pm, they ran tests, did an EKG. She states she decided around 11pm she could be miserable at home. When she got home she took an extra pain pill and went to sleep. Pt states I feel better this morning, I just wanted to let Dr. Debara Pickett know what was going on and see if he wanted to compare the EKG from last night to the last one in the office and see if I needed to come in. Pt made aware Dr. Debara Pickett is out of the office today but I will get a message over to him. She verbalized understanding.

## 2022-04-14 NOTE — Telephone Encounter (Signed)
Called pt to relay Dr. Lysbeth Penner message. She verbalize understanding. No further questions at this time.

## 2022-04-14 NOTE — Telephone Encounter (Signed)
Reviewed the EKG - unchanged. Troponin was negative. I'm sorry she feels unwell. I would advise follow-up with PCP if she does not feel better by the end of the week.  Dr. Lemmie Evens

## 2022-04-14 NOTE — Telephone Encounter (Signed)
Pt c/o of Chest Pain: STAT if CP now or developed within 24 hours  1. Are you having CP right now?  No  2. Are you experiencing any other symptoms (ex. SOB, nausea, vomiting, sweating)?  Lightheadedness, afib on and off  3. How long have you been experiencing CP?  CP/cramping developed yesterday around 7:00 PM and patient went to the ED around 8:00 PM  4. Is your CP continuous or coming and going?  Coming and going, every few minutes yesterday  5. Have you taken Nitroglycerin?  No ?

## 2022-04-20 ENCOUNTER — Other Ambulatory Visit (HOSPITAL_BASED_OUTPATIENT_CLINIC_OR_DEPARTMENT_OTHER): Payer: Self-pay | Admitting: Internal Medicine

## 2022-04-21 ENCOUNTER — Other Ambulatory Visit: Payer: Self-pay | Admitting: *Deleted

## 2022-04-21 DIAGNOSIS — I739 Peripheral vascular disease, unspecified: Secondary | ICD-10-CM

## 2022-04-23 ENCOUNTER — Encounter: Payer: PPO | Attending: Registered Nurse | Admitting: Registered Nurse

## 2022-04-23 VITALS — BP 121/75 | HR 75 | Ht 64.0 in | Wt 190.8 lb

## 2022-04-23 DIAGNOSIS — I4891 Unspecified atrial fibrillation: Secondary | ICD-10-CM | POA: Diagnosis not present

## 2022-04-23 DIAGNOSIS — M25562 Pain in left knee: Secondary | ICD-10-CM | POA: Insufficient documentation

## 2022-04-23 DIAGNOSIS — Z79891 Long term (current) use of opiate analgesic: Secondary | ICD-10-CM | POA: Insufficient documentation

## 2022-04-23 DIAGNOSIS — M7061 Trochanteric bursitis, right hip: Secondary | ICD-10-CM | POA: Diagnosis not present

## 2022-04-23 DIAGNOSIS — M797 Fibromyalgia: Secondary | ICD-10-CM | POA: Insufficient documentation

## 2022-04-23 DIAGNOSIS — Z5181 Encounter for therapeutic drug level monitoring: Secondary | ICD-10-CM | POA: Insufficient documentation

## 2022-04-23 DIAGNOSIS — M545 Low back pain, unspecified: Secondary | ICD-10-CM | POA: Insufficient documentation

## 2022-04-23 DIAGNOSIS — G8929 Other chronic pain: Secondary | ICD-10-CM | POA: Diagnosis not present

## 2022-04-23 DIAGNOSIS — M25561 Pain in right knee: Secondary | ICD-10-CM | POA: Diagnosis not present

## 2022-04-23 DIAGNOSIS — G894 Chronic pain syndrome: Secondary | ICD-10-CM | POA: Diagnosis not present

## 2022-04-23 DIAGNOSIS — G479 Sleep disorder, unspecified: Secondary | ICD-10-CM | POA: Diagnosis not present

## 2022-04-23 DIAGNOSIS — E785 Hyperlipidemia, unspecified: Secondary | ICD-10-CM | POA: Diagnosis not present

## 2022-04-23 DIAGNOSIS — M7062 Trochanteric bursitis, left hip: Secondary | ICD-10-CM | POA: Diagnosis not present

## 2022-04-23 DIAGNOSIS — I10 Essential (primary) hypertension: Secondary | ICD-10-CM | POA: Diagnosis not present

## 2022-04-23 MED ORDER — HYDROCODONE-ACETAMINOPHEN 5-325 MG PO TABS: 1.0000 | ORAL_TABLET | Freq: Four times a day (QID) | ORAL | 0 refills | Status: DC | PRN

## 2022-04-23 NOTE — Progress Notes (Signed)
Subjective:    Patient ID: Kara Bush, female    DOB: 26-Dec-1940, 81 y.o.   MRN: [id number]  HPI: Kara Bush is a 81 y.o. female who returns for follow up appointment for chronic pain and medication refill. She states her pain is located in her mid- lower back, bilateral hips and bilateral knee pain. She rates her pain 4.Her current exercise regime is walking and performing stretching exercises.  Kara Bush Morphine equivalent is 23.33 MME.   Last UDS was Performed on 10/21/2021, it was consistent.     Pain Inventory Average Pain 4 Pain Right Now 4 My pain is constant and aching  In the last 24 hours, has pain interfered with the following? General activity 4 Relation with others 0 Enjoyment of life 4 What TIME of day is your pain at its worst? morning  Sleep (in general) Fair  Pain is worse with: walking and bending Pain improves with: medication Relief from Meds: 7  Family History  Problem Relation Age of Onset   Heart disease Father    Heart attack Father        @ 23   Cancer Paternal Aunt        BREAST   Heart failure Mother        at 52   Healthy Sister    Cancer Brother        pancreatic    Heart failure Maternal Grandmother    Heart attack Maternal Grandfather    Heart attack Paternal Grandmother        at 91   Healthy Daughter    Healthy Daughter    Social History   Socioeconomic History   Marital status: Divorced    Spouse name: Not on file   Number of children: 2   Years of education: college    Highest education level: Not on file  Occupational History    Employer: RETIRED  Tobacco Use   Smoking status: Former    Packs/day: 2.50    Years: 20.00    Total pack years: 50.00    Types: Cigarettes    Quit date: 06/29/1990    Years since quitting: 31.8    Passive exposure: Never   Smokeless tobacco: Never  Vaping Use   Vaping Use: Never used  Substance and Sexual Activity   Alcohol use: No   Drug use: No   Sexual activity:  Not on file  Other Topics Concern   Not on file  Social History Narrative   Not on file   Social Determinants of Health   Financial Resource Strain: Not on file  Food Insecurity: Not on file  Transportation Needs: Not on file  Physical Activity: Not on file  Stress: Not on file  Social Connections: Not on file   Past Surgical History:  Procedure Laterality Date   [place]   CATARACT EXTRACTION Left 09/2019   [place]  2006   to LAD, ATRETIC LIMA AT THAT TIME ALSO   CORONARY ANGIOPLASTY WITH STENT PLACEMENT  2007   STENT TO PROX VG-DIAG, OTHER STENTS PATENT   CORONARY ARTERY BYPASS GRAFT  1998   LIMA-LAD;VG-DIAG & RCA   CORONARY STENT PLACEMENT  2005   TO LAD & LCX-STENTS,   HERNIA REPAIR     TUBAL LIGATION     BTL   Past Surgical History:  Procedure Laterality Date   [place]  CATARACT EXTRACTION Left 09/2019   CHOLECYSTECTOMY  1997   CORONARY ANGIOPLASTY WITH STENT PLACEMENT  2006   to LAD, ATRETIC LIMA AT THAT TIME ALSO   CORONARY ANGIOPLASTY WITH STENT PLACEMENT  2007   STENT TO PROX VG-DIAG, OTHER STENTS PATENT   CORONARY ARTERY BYPASS GRAFT  1998   LIMA-LAD;VG-DIAG & RCA   CORONARY STENT PLACEMENT  2005   TO LAD & LCX-STENTS,   HERNIA REPAIR     TUBAL LIGATION     BTL   Past Medical History:  Diagnosis Date   Abdominal aortic aneurysm (HCC)    Anemia, improved 01/11/2013   Basal cell carcinoma    CAD (coronary artery disease)    hx CABG 1998, last cath 2007 with PCI and stenting to the proximal segment of the vein graft to the diagonal branch. DES Taxus was placed   Cervicalgia    Chest wall pain    Chronic fatigue fibromyalgia syndrome    Chronic pain syndrome    Coronary artery disease    Depression    Fibromyalgia    Fibromyalgia    History of nuclear stress test 08/07/2011   lexiscan; no evidence of ischemia or infarct; low risk     Hyperlipidemia    Hypertension    Hypertension    Obstructive sleep apnea    OSA (obstructive sleep apnea)    PAD (peripheral artery disease) (HCC)    Pes anserine bursitis    Pulmonary embolism ([place]) 01/11/2013   S/P resection of aortic aneurysm    Trochanteric bursitis    Ulcerative colitis (HCC)    BP 121/75   Pulse 75   Ht '5\' 4"'$  (1.626 m)   Wt 190 lb 12.8 oz (86.5 kg)   SpO2 95%   BMI 32.75 kg/m   Opioid Risk Score:   Fall Risk Score:  `1  Depression screen PHQ 2/9     02/19/2022    1:40 PM 10/21/2021   11:08 AM 06/24/2021   10:48 AM 04/23/2021   10:41 AM 06/04/2020   11:11 AM 02/02/2020   10:59 AM 08/11/2019    9:31 AM  Depression screen PHQ 2/9  Decreased Interest 0 0 0 0 0 0 0  Down, Depressed, Hopeless 0 0 0 0 0 0 0  PHQ - 2 Score 0 0 0 0 0 0 0     Review of Systems  Musculoskeletal:  Positive for back pain.       Bilateral knee pain  All other systems reviewed and are negative.     Objective:   Physical Exam Vitals and nursing note reviewed.  Constitutional:      Appearance: Normal appearance.  Cardiovascular:     Rate and Rhythm: Normal rate and regular rhythm.     Pulses: Normal pulses.     Heart sounds: Normal heart sounds.  Pulmonary:     Effort: Pulmonary effort is normal.     Breath sounds: Normal breath sounds.  Musculoskeletal:     Cervical back: Normal range of motion and neck supple.     Comments: Normal Muscle Bulk and Muscle Testing Reveals:  Upper Extremities: Full ROM and Muscle Strength 5/5  Thoracic Paraspinal Tenderness: T-4-T-6 Lumbar Paraspinal Tenderness: L-4- L-5 Bilateral Greater Trochanter Tenderness Lower Extremities: Full ROM and Muscle Strength 5/5 Arises from Table Slowly Narrow Based  Gait     Skin:    General: Skin is warm and dry.  Neurological:     Mental Status: She is alert and  oriented to person, place, and time.  Psychiatric:        Mood and Affect: Mood normal.        Behavior: Behavior normal.          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 04/23/2022 2. Fibromyalgia: Continue  Home Exercise Program. Continue to Monitor.04/23/2022 3. Bilateral intermittent hand numbness: Carpal tunnel syndrome versus C6 radiculopathy mild: No complaints today. Continue to Monitor. 04/23/2022 4. Chronic Midline Low Back Pain/ LBP: Refilled: Norco 5/'325mg'$  # 140 pills--use one pill every 6 hours as needed for pain. A second prescription was e-scribe for the following month 04/23/2022. We will continue the opioid monitoring program, this consists of regular clinic visits, examinations, urine drug screen, pill counts as well as use of [place] Controlled Substance Reporting system. A 12 month History has been reviewed on the [place] Controlled Substance Reporting System on 04/23/2022.. 5. Muscle Spasm: Continue current medication regimen with  Robaxin. 04/23/2022 6.Bilateral  Greater Trochanteric Bursitis:Continue to Alternate with heat and ice therapy. Continue current medication regime. 04/23/2022  7. Left Foot pain: No complaints today.Continue with HEP as Tolerated. Continue to Monitor. 04/23/2022 8. Right Lateral Epicondylitis Pain: No complaints Today. Continue HEP as Tolerated. Continue to Monitor. 04/23/2022 9. Right Shoulder Tendonitis:  No complaints today. Continue Current Medication Regimen. Continue to Alternate Ice and Heat Therapy. 04/23/2022 10 Bilateral Chronic Knee Pain: Continue HEP as Tolerated. Continue to Monitor. 04/23/2022   F/U in 2 months

## 2022-04-27 ENCOUNTER — Encounter: Payer: Self-pay | Admitting: Registered Nurse

## 2022-04-30 ENCOUNTER — Encounter: Payer: Self-pay | Admitting: Vascular Surgery

## 2022-04-30 ENCOUNTER — Ambulatory Visit: Payer: PPO | Admitting: Vascular Surgery

## 2022-04-30 VITALS — BP 179/72 | HR 71 | Temp 98.3°F | Resp 20 | Ht 66.0 in | Wt 191.3 lb

## 2022-04-30 DIAGNOSIS — I70213 Atherosclerosis of native arteries of extremities with intermittent claudication, bilateral legs: Secondary | ICD-10-CM

## 2022-04-30 NOTE — Progress Notes (Signed)
Patient ID: Kara Bush, female   DOB: [birth year], 81 y.o.   MRN: [id number]  Reason for Consult: New Patient (Initial Visit)   Referred by Pixie Casino, MD  Subjective:     HPI:  Kara Bush is a 81 y.o. female with a history of aortobi-iliac artery bypass in 1991 for severe claudication.  She also has a history of coronary artery bypass grafting in 2007 and subsequent intervention on that with drug-eluting stent.  She is on chronic anticoagulation for atrial fibrillation.  More recently she has developed pain and fatigue in her bilateral lower extremities starting in her hips and radiating down.  This affects her mostly when she is walking and prevents her from fully walking through the grocery store which is quite life limiting.  She denies any tissue loss or ulceration.  No history of stroke, TIA or amaurosis.  Past Medical History:  Diagnosis Date   Abdominal aortic aneurysm ([place])    Anemia, improved 01/11/2013   Basal cell carcinoma    CAD (coronary artery disease)    hx CABG 1998, last cath 2007 with PCI and stenting to the proximal segment of the vein graft to the diagonal branch. DES Taxus was placed   Cervicalgia    Chest wall pain    Chronic fatigue fibromyalgia syndrome    Chronic pain syndrome    Coronary artery disease    Depression    Fibromyalgia    Fibromyalgia    History of nuclear stress test 08/07/2011   lexiscan; no evidence of ischemia or infarct; low risk    Hyperlipidemia    Hypertension    Hypertension    Obstructive sleep apnea    OSA (obstructive sleep apnea)    PAD (peripheral artery disease) (HCC)    Pes anserine bursitis    Pulmonary embolism ([place]) 01/11/2013   S/P resection of aortic aneurysm    Trochanteric bursitis    Ulcerative colitis ([place])    Family History  Problem Relation Age of Onset   Heart disease Father    Heart attack Father        @ 40   Cancer Paternal Aunt        BREAST   Heart failure Mother        at 40    Healthy Sister    Cancer Brother        pancreatic    Heart failure Maternal Grandmother    Heart attack Maternal Grandfather    Heart attack Paternal Grandmother        at 73   Healthy Daughter    Healthy Daughter    Past Surgical History:  Procedure Laterality Date   AORTOBIEXTERNAL ILIAC BYPASS  1991   CATARACT EXTRACTION Left 09/2019   CHOLECYSTECTOMY  1997   CORONARY ANGIOPLASTY WITH STENT PLACEMENT  2006   to LAD, ATRETIC LIMA AT THAT TIME ALSO   CORONARY ANGIOPLASTY WITH STENT PLACEMENT  2007   STENT TO PROX VG-DIAG, OTHER STENTS PATENT   CORONARY ARTERY BYPASS GRAFT  1998   LIMA-LAD;VG-DIAG & RCA   CORONARY STENT PLACEMENT  2005   TO LAD & LCX-STENTS,   HERNIA REPAIR     TUBAL LIGATION     BTL    Short Social History:  Social History   Tobacco Use   Smoking status: Former    Packs/day: 2.50    Years: 20.00    Total pack years: 50.00    Types: Cigarettes  Quit date: 06/29/1990    Years since quitting: 31.8    Passive exposure: Never   Smokeless tobacco: Never  Substance Use Topics   Alcohol use: No    Allergies  Allergen Reactions   Biotin Hives   Lisinopril Cough   Penicillin G     Other reaction(s): rash   Atenolol Other (See Comments)    colitis Other reaction(s): Bowel issues   Duloxetine Hcl Other (See Comments)    Mad pt feel spacey Other reaction(s): shaky; tired in the PMs   Lyrica [Pregabalin] Other (See Comments)    Made pt feel spacey   Penicillins Rash   Sulfamethoxazole-Trimethoprim Nausea And Vomiting    Other reaction(s): elevated liver functions    Current Outpatient Medications  Medication Sig Dispense Refill   amLODipine (NORVASC) 5 MG tablet TAKE ONE TABLET BY MOUTH ONCE DAILY 90 tablet 1   apixaban (ELIQUIS) 5 MG TABS tablet Take 1 tablet by mouth 2 (two) times daily.     b complex vitamins capsule Take 1 capsule by mouth daily.     betamethasone dipropionate 3.76 % cream 1 application     citalopram (CELEXA) 20 MG  tablet Take 1 tablet by mouth daily.     clobetasol ointment (TEMOVATE) 0.05 % APPLY THIN LAYER TO AFFECTED AREAS EXTERNALLY TWO TO THREE TIMES WEEKLY AS NEEDED 90 DAYS     ezetimibe (ZETIA) 10 MG tablet Take 1 tablet by mouth daily.     furosemide (LASIX) 40 MG tablet Take 1 tablet (40 mg total) by mouth as needed for fluid or edema (Daily as needed for swelling or weight gain of 2 pounds overnight or 5 pounds in a week). Take one tablet by mouth daily for 4 days 90 tablet 3   HYDROcodone-acetaminophen (NORCO/VICODIN) 5-325 MG tablet Take 1 tablet by mouth 4 (four) times daily as needed for moderate pain. May Take an extra 0.5 to one tablet when pain is severe. 140 tablet 0   methocarbamol (ROBAXIN) 500 MG tablet Take 1 tablet by mouth every 8 (eight) hours as needed.     metoprolol succinate (TOPROL-XL) 25 MG 24 hr tablet Take 1 tablet by mouth daily.     metoprolol succinate (TOPROL-XL) 50 MG 24 hr tablet TAKE ONE TABLET BY MOUTH ONCE DAILY 90 tablet 1   Multiple Vitamin (MULITIVITAMIN WITH MINERALS) TABS Take 1 tablet by mouth daily.     NEOMYCIN-POLYMYXIN-DEXAMETH OP INSTILL 1 DROP(S) IN THE AFFECTED EYE 4 TIME(S) PER DAY X 5 DAYS     ondansetron (ZOFRAN) 4 MG tablet Take 1 tablet by mouth every 6 (six) hours as needed.     potassium chloride (KLOR-CON M) 10 MEQ tablet Take 1 tablet (10 mEq total) by mouth as needed (please take when you need take your lasix). 90 tablet 3   sacubitril-valsartan (ENTRESTO) 97-103 MG Take 1 tablet by mouth 2 (two) times daily. 180 tablet 1   simvastatin (ZOCOR) 40 MG tablet Take 1 tablet by mouth daily.     valsartan (DIOVAN) 160 MG tablet Take 1 tablet by mouth daily.     valsartan (DIOVAN) 80 MG tablet Take 1 tablet by mouth daily.     zolpidem (AMBIEN CR) 12.5 MG CR tablet Take 12.5 mg by mouth at bedtime as needed.     No current facility-administered medications for this visit.    Review of Systems  Constitutional:  Constitutional negative. HENT: HENT  negative.  Eyes: Eyes negative.  Respiratory: Respiratory negative.  Cardiovascular: Positive  for claudication.  GI: Gastrointestinal negative.  Musculoskeletal: Positive for gait problem, leg pain and joint pain.  Skin: Skin negative.  Hematologic: Hematologic/lymphatic negative.  Psychiatric: Psychiatric negative.        Objective:  Objective   Vitals:   04/30/22 1349  BP: (!) 179/72  Pulse: 71  Resp: 20  Temp: 98.3 F (36.8 C)  SpO2: 96%  Weight: 191 lb 4.8 oz (86.8 kg)  Height: '5\' 6"'$  (1.676 m)   Body mass index is 30.88 kg/m.  Physical Exam HENT:     Head: Normocephalic.     Nose: Nose normal.  Eyes:     Pupils: Pupils are equal, round, and reactive to light.  Cardiovascular:     Rate and Rhythm: Normal rate.     Pulses:          Femoral pulses are 0 on the right side and 1+ on the left side.      Popliteal pulses are 0 on the right side and 0 on the left side.  Pulmonary:     Effort: Pulmonary effort is normal.  Abdominal:     General: Abdomen is flat.     Palpations: Abdomen is soft.  Musculoskeletal:     Right lower leg: No edema.     Left lower leg: No edema.  Skin:    General: Skin is warm.     Capillary Refill: Capillary refill takes 2 to 3 seconds.  Neurological:     General: No focal deficit present.     Mental Status: She is alert.  Psychiatric:        Mood and Affect: Mood normal.        Thought Content: Thought content normal.     Data: ABI Findings:  +---------+------------------+-----+-----------+--------+  Right    Rt Pressure (mmHg)IndexWaveform   Comment   +---------+------------------+-----+-----------+--------+  Brachial 174                                         +---------+------------------+-----+-----------+--------+  PTA      117               0.67 multiphasic          +---------+------------------+-----+-----------+--------+  PERO     121               0.70 multiphasic           +---------+------------------+-----+-----------+--------+  DP       115               0.66 multiphasic          +---------+------------------+-----+-----------+--------+  Great Toe112               0.64 Abnormal             +---------+------------------+-----+-----------+--------+   +---------+------------------+-----+-----------+-------+  Left     Lt Pressure (mmHg)IndexWaveform   Comment  +---------+------------------+-----+-----------+-------+  Brachial 174                                        +---------+------------------+-----+-----------+-------+  PTA      119               0.68 multiphasic         +---------+------------------+-----+-----------+-------+  PERO     119  0.68 multiphasic         +---------+------------------+-----+-----------+-------+  DP       128               0.74 multiphasic         +---------+------------------+-----+-----------+-------+  Great Toe89                0.51 Normal              +---------+------------------+-----+-----------+-------+   +-------+-----------+-----------+------------+------------+  ABI/TBIToday's ABIToday's TBIPrevious ABIPrevious TBI  +-------+-----------+-----------+------------+------------+  Right  .70        .64        .88         .79           +-------+-----------+-----------+------------+------------+  Left   .74        .51        .92         .83           +-------+-----------+-----------+------------+------------+          Bilateral ABIs and TBIs appear decreased compared to prior study on  09/09/2018.     Summary:  Right: Resting right ankle-brachial index indicates moderate right lower  extremity arterial disease. The right toe-brachial index is abnormal.   Left: Resting left ankle-brachial index indicates moderate left lower  extremity arterial disease. The left toe-brachial index is abnormal.   Abdominal Aorta Findings:   +-------------+-------+----------+---------+----------+--------+-----------  ----+  Location     AP (cm)Trans (cm)PSV      Waveform  ThrombusComments                                        (cm/s)                                       +-------------+-------+----------+---------+----------+--------+-----------  ----+  Proximal     2.10   2.10      412                        increase                                                                   velocities                                                                 compared  to                                                               prior exam        +-------------+-------+----------+---------+----------+--------+-----------  ----+  Mid  1.50   1.50      290                                          +-------------+-------+----------+---------+----------+--------+-----------  ----+  Distal       1.50   1.50      234                                          +-------------+-------+----------+---------+----------+--------+-----------  ----+  RT EIA Prox                   212      biphasic          turbulent          +-------------+-------+----------+---------+----------+--------+-----------  ----+  RT EIA Mid                    161      monophasic                          +-------------+-------+----------+---------+----------+--------+-----------  ----+  RT EIA Distal                 127      monophasic                          +-------------+-------+----------+---------+----------+--------+-----------  ----+  LT EIA Prox                   219      biphasic                            +-------------+-------+----------+---------+----------+--------+-----------  ----+  LT EIA Mid                    152      biphasic                             +-------------+-------+----------+---------+----------+--------+-----------  ----+  LT EIA Distal                 128      triphasic         dampened          +-------------+-------+----------+---------+----------+--------+-----------  ----+     IVC/Iliac Findings:  +--------+------+--------+--------+    IVC   PatentThrombusComments  +--------+------+--------+--------+  IVC Proxpatent                  +--------+------+--------+--------+           Right Graft #1:  +------------------+--------+---------------+--------+---------+                    PSV cm/sStenosis       WaveformComments   +------------------+--------+---------------+--------+---------+  Inflow            234     50-74% stenosisbiphasicturbulent  +------------------+--------+---------------+--------+---------+  Prox Anastomosis  287                    biphasicturbulent  +------------------+--------+---------------+--------+---------+  Proximal Graft    276     50-70% stenosisbiphasicturbulent  +------------------+--------+---------------+--------+---------+  Mid Graft  248                    biphasic           +------------------+--------+---------------+--------+---------+  Distal Graft      238                    biphasic           +------------------+--------+---------------+--------+---------+  Distal Anastomosis279                    biphasic           +------------------+--------+---------------+--------+---------+  Outflow           267     50-74% stenosisbiphasic           +------------------+--------+---------------+--------+---------+   Patent right limb with elevated velocities throughout. >50% stenosis noted  at inflow, proximal graft and outflow segment. Velocities are essentially  stable compared to prior exam.   Left Graft #1:  +--------------------+--------+---------------+--------+-------------------  ----+                       PSV cm/sStenosis       WaveformComments                  +--------------------+--------+---------------+--------+-------------------  ----+  Inflow              234     50-74% stenosisbiphasicturbulent                  +--------------------+--------+---------------+--------+-------------------  ----+  Proximal Anastomosis358                    biphasicturbulent                  +--------------------+--------+---------------+--------+-------------------  ----+  Proximal Graft      314     50-70% stenosisbiphasic                          +--------------------+--------+---------------+--------+-------------------  ----+  Mid Graft           284                    biphasic                          +--------------------+--------+---------------+--------+-------------------  ----+  Distal Graft                                       not visualized;                                                               obscured by bowel  gas    +--------------------+--------+---------------+--------+-------------------  ----+  Distal Anastomosis  280                    biphasic                          +--------------------+--------+---------------+--------+-------------------  ----+  Outflow             236  50-74% stenosisbiphasic                          +--------------------+--------+---------------+--------+-------------------  ----+   Patent left limb with elevated velocities throughout. >50% stenosis noted  at inflow, proximal graft and outflow segment. Velocities are essentially  stable compared to prior exam.        Summary:  Abdominal Aorta: No evidence of an abdominal aortic aneurysm was  visualized. The largest aortic measurement is 2.1 cm.   Stenosis:  +--------------------+-------------+---------------------------------------  ---+  Location            Stenosis     Comments                                      +--------------------+-------------+---------------------------------------  ---+  Prox Aorta          >50% stenosisincrease velocities compared to prior  exam  +--------------------+-------------+---------------------------------------  ---+  Right External Iliac>50% stenosisproximal segment, essentially stable         +--------------------+-------------+---------------------------------------  ---+  Left External Iliac >50% stenosisproximal segment, essentially stable         +--------------------+-------------+---------------------------------------  ---+   Patent right and left limbs with elevated velocities throughout.  Essentially stable >50% stenosis noted at inflow, proximal graft and  outflow segments.      Assessment/Plan:    81yo female history of aortobi-iliac bypass now with recurrent bilateral lower extremity pain with a claudication component although also appears to be multifactorial.  I can feel a weak pulse on the left common femoral artery no pulses below that I do not appreciate a pulse in the right common femoral artery or anything below that.  Given her symptoms we will plan for CT angio abdomen pelvis with runoff.  Certainly would be difficult to go up and over to fix her likely SFA disease but possibly could treat any disease within the graft as identified on duplex.  We will get the CT and she will follow-up in a few weeks with me to discuss the options moving forward.  All of her questions were answered.     Waynetta Sandy MD [facility]

## 2022-05-01 ENCOUNTER — Other Ambulatory Visit: Payer: Self-pay | Admitting: Registered Nurse

## 2022-05-01 DIAGNOSIS — H02055 Trichiasis without entropian left lower eyelid: Secondary | ICD-10-CM | POA: Diagnosis not present

## 2022-05-01 DIAGNOSIS — H02052 Trichiasis without entropian right lower eyelid: Secondary | ICD-10-CM | POA: Diagnosis not present

## 2022-05-03 ENCOUNTER — Other Ambulatory Visit: Payer: Self-pay

## 2022-05-03 DIAGNOSIS — I70213 Atherosclerosis of native arteries of extremities with intermittent claudication, bilateral legs: Secondary | ICD-10-CM

## 2022-05-05 DIAGNOSIS — M21961 Unspecified acquired deformity of right lower leg: Secondary | ICD-10-CM | POA: Diagnosis not present

## 2022-05-05 DIAGNOSIS — I10 Essential (primary) hypertension: Secondary | ICD-10-CM | POA: Diagnosis not present

## 2022-05-05 DIAGNOSIS — E785 Hyperlipidemia, unspecified: Secondary | ICD-10-CM | POA: Diagnosis not present

## 2022-05-05 DIAGNOSIS — I739 Peripheral vascular disease, unspecified: Secondary | ICD-10-CM | POA: Diagnosis not present

## 2022-05-05 DIAGNOSIS — B353 Tinea pedis: Secondary | ICD-10-CM | POA: Diagnosis not present

## 2022-05-05 DIAGNOSIS — M1990 Unspecified osteoarthritis, unspecified site: Secondary | ICD-10-CM | POA: Diagnosis not present

## 2022-05-05 DIAGNOSIS — M21962 Unspecified acquired deformity of left lower leg: Secondary | ICD-10-CM | POA: Diagnosis not present

## 2022-05-05 DIAGNOSIS — M792 Neuralgia and neuritis, unspecified: Secondary | ICD-10-CM | POA: Diagnosis not present

## 2022-05-05 DIAGNOSIS — G8929 Other chronic pain: Secondary | ICD-10-CM | POA: Diagnosis not present

## 2022-05-09 NOTE — Progress Notes (Unsigned)
Office Visit Note  Patient: Kara Bush             Date of Birth: 23-Feb-1941           MRN: [id number]             PCP: Lurline Del, DO Referring: Aretta Nip, MD Visit Date: 05/22/2022 Occupation: '@GUAROCC'$ @  Subjective:  Trochanteric bursitis of both hips  History of Present Illness: Kara Bush is a 81 y.o. female with history of fibromyalgia and osteoarthritis.  She states that she continues to have chronic myalgias and muscle tenderness due to fibromyalgia but has not had any significant recent flares.  She continues to take hydrocodone on a daily basis for pain relief which has been adequately controlling her pain.   She is experiencing increased muscle spasms or muscle cramping she will take methocarbamol as needed for symptomatic relief.  She continues to take Ambien 12.5 mg at bedtime for insomnia.  She was referred to physical therapy after her last office visit for trochanteric bursitis of both hips.  She did not have good success with physical therapy at [facility].  She continues to have discomfort on the lateral aspect of both hips.  She has been undergoing a thorough work-up by Dr. Donzetta Matters with vascular surgery.  She had a recent CT angiography on 05/19/2022.  She will be following up with Dr. Donzetta Matters on 05/28/2022 to review the results and the next steps.  He has difficulty walking prolonged distances due to experiencing claudication in both lower extremities.   Activities of Daily Living:  Patient reports morning stiffness for 30 minutes.   Patient Denies nocturnal pain.  Difficulty dressing/grooming: Reports Difficulty climbing stairs: Reports Difficulty getting out of chair: Reports Difficulty using hands for taps, buttons, cutlery, and/or writing: Denies  Review of Systems  Constitutional:  Negative for fatigue.  HENT:  Negative for mouth sores, mouth dryness and nose dryness.   Eyes:  Positive for dryness. Negative for pain and visual  disturbance.  Respiratory:  Negative for cough, hemoptysis, shortness of breath and difficulty breathing.   Cardiovascular:  Positive for palpitations and irregular heartbeat. Negative for hypertension and swelling in legs/feet.  Gastrointestinal:  Negative for blood in stool, constipation and diarrhea.  Endocrine: Negative for increased urination.  Genitourinary:  Negative for painful urination and involuntary urination.  Musculoskeletal:  Positive for joint pain, joint pain, myalgias, muscle weakness, morning stiffness, muscle tenderness and myalgias. Negative for gait problem and joint swelling.  Skin:  Negative for color change, pallor, rash, hair loss, nodules/bumps, skin tightness, ulcers and sensitivity to sunlight.  Allergic/Immunologic: Negative for susceptible to infections.  Neurological:  Negative for dizziness, numbness, headaches and weakness.  Hematological:  Negative for swollen glands.  Psychiatric/Behavioral:  Negative for depressed mood and sleep disturbance. The patient is not nervous/anxious.     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   Greater trochanteric bursitis of right hip 11/07/2014   Sacro-iliac pain 11/07/2014   Chronic midline low back pain without sciatica 11/07/2013   Hepatitis 04/20/2013   Screening for STD (sexually transmitted disease) 04/20/2013   Recurrent UTI 04/20/2013   Abdominal aortic aneurysm (HCC)    Ulcerative colitis ([place])    Obstructive sleep apnea    Fibromyalgia    Coronary artery disease  Basal cell carcinoma    Anemia, improved 01/11/2013   Ascending aortic aneurysm, 4.1 cm 01/11/2013   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     Hypertension     Past Medical History:  Diagnosis Date   Abdominal aortic aneurysm (HCC)    Anemia, improved 01/11/2013   Basal cell carcinoma    CAD (coronary artery disease)    hx CABG 1998, last cath 2007 with PCI and stenting to the proximal segment of the vein graft to the diagonal branch. DES Taxus was placed   Cervicalgia    Chest wall pain    Chronic fatigue fibromyalgia syndrome    Chronic pain syndrome    Coronary artery disease    Depression    Fibromyalgia    Fibromyalgia    History of nuclear stress test 08/07/2011   lexiscan; no evidence of ischemia or infarct; low risk    Hyperlipidemia    Hypertension    Hypertension    Obstructive sleep apnea    OSA (obstructive sleep apnea)    PAD (peripheral artery disease) (HCC)    Pes anserine bursitis    Pulmonary embolism ([place]) 01/11/2013   S/P resection of aortic aneurysm    Trochanteric bursitis    Ulcerative colitis ([place])     Family History  Problem Relation Age of Onset   Heart disease Father    Heart attack Father        @ 50   Cancer Paternal Aunt        BREAST   Heart failure Mother        at 32   Healthy Sister    Cancer Brother        pancreatic    Heart failure Maternal Grandmother    Heart attack Maternal Grandfather    Heart attack Paternal Grandmother        at 85   Healthy Daughter    Healthy Daughter    Past Surgical History:  Procedure Laterality Date   AORTOBIEXTERNAL ILIAC BYPASS  1991   CATARACT EXTRACTION Left 09/2019   CHOLECYSTECTOMY  1997   CORONARY ANGIOPLASTY WITH STENT PLACEMENT  2006   to LAD, ATRETIC LIMA AT THAT TIME ALSO   CORONARY ANGIOPLASTY WITH STENT PLACEMENT  2007   STENT TO PROX VG-DIAG,  OTHER STENTS PATENT   CORONARY ARTERY BYPASS GRAFT  1998   LIMA-LAD;VG-DIAG & RCA   CORONARY STENT PLACEMENT  2005   TO LAD & LCX-STENTS,   HERNIA REPAIR     TUBAL LIGATION     BTL   Social History   Social History Narrative   Not on file   Immunization History  Administered Date(s) Administered   Influenza,inj,Quad PF,6+ Mos 04/20/2013, 06/01/2014   PFIZER(Purple Top)SARS-COV-2 Vaccination 08/18/2019, 09/11/2019, 04/27/2020     Objective: Vital Signs: BP 104/67 (BP Location: Left Arm, Patient Position: Sitting, Cuff Size: Normal)   Pulse 98   Resp 16   Ht '5\' 4"'$  (1.626 m)   Wt 193 lb (87.5 kg)   BMI 33.13 kg/m    Physical  Exam Vitals and nursing note reviewed.  Constitutional:      Appearance: She is well-developed.  HENT:     Head: Normocephalic and atraumatic.  Eyes:     Conjunctiva/sclera: Conjunctivae normal.  Cardiovascular:     Rate and Rhythm: Normal rate. Rhythm irregular.     Heart sounds: Murmur heard.  Pulmonary:     Effort: Pulmonary effort is normal.     Breath sounds: Normal breath sounds.  Abdominal:     General: Bowel sounds are normal.     Palpations: Abdomen is soft.  Musculoskeletal:     Cervical back: Normal range of motion.  Skin:    General: Skin is warm and dry.     Capillary Refill: Capillary refill takes less than 2 seconds.  Neurological:     Mental Status: She is alert and oriented to person, place, and time.  Psychiatric:        Behavior: Behavior normal.      Musculoskeletal Exam: C-spine has limited range of motion with lateral rotation.  Trapezius muscle tension and tenderness bilaterally.  Shoulder joints, elbow joints, wrist joints, MCPs, PIPs, DIPs have good range of motion with no synovitis.  PIP and DIP thickening consistent with osteoarthritis of both hands.  Complete fist formation bilaterally.  Hip joints have good range of motion with no groin pain.  Tenderness over bilateral trochanteric bursa.  Knee joints have good  range of motion with no warmth or effusion.  Ankle joints have good range of motion with no tenderness or joint swelling.  CDAI Exam: CDAI Score: -- Patient Global: --; Provider Global: -- Swollen: --; Tender: -- Joint Exam 05/22/2022   No joint exam has been documented for this visit   There is currently no information documented on the homunculus. Go to the Rheumatology activity and complete the homunculus joint exam.  Investigation: No additional findings.  Imaging: CT ANGIO AO+BIFEM W & OR WO CONTRAST  Result Date: 05/20/2022 CLINICAL DATA:  81 year old female with claudication. History of prior aorto bi-iliac bypass EXAM: CT ANGIOGRAPHY OF ABDOMINAL AORTA WITH ILIOFEMORAL RUNOFF TECHNIQUE: Multidetector CT imaging of the abdomen, pelvis and lower extremities was performed using the standard protocol during bolus administration of intravenous contrast. Multiplanar CT image reconstructions and MIPs were obtained to evaluate the vascular anatomy. RADIATION DOSE REDUCTION: This exam was performed according to the departmental dose-optimization program which includes automated exposure control, adjustment of the mA and/or kV according to patient size and/or use of iterative reconstruction technique. CONTRAST:  46m OMNIPAQUE IOHEXOL 350 MG/ML SOLN COMPARISON:  11/20/2015 FINDINGS: VASCULAR Aorta: Mild atherosclerotic changes of the distal thoracic aorta. Diameter at the hiatus 19 mm. Surgical changes of aorto bi-iliac bypass. No aneurysm at the proximal anastomosis. Patency of the bilateral distal anastomosis with no distal aneurysm. Coral reef plaque present within the native aorta, posterior aorta at the level of the left renal artery. 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 This plaque contributes to 50% cross-sectional narrowing. Just below this plaque there is a second calcified plaque with  270 degrees of circumferential involvement with 50% or greater narrowing just above the anastomosis. Celiac: Celiac patent with minimal atherosclerosis. SMA: SMA patent with minimal atherosclerosis. Renals: - Right: Right renal artery patent without significant atherosclerosis at the origin. - Left: Interval occlusion of the left renal artery secondary to calcified plaque. IMA: IMA has been ligated previously with the bypass. Right  lower extremity: Right graft limb is patent with the anastomosis in the proximal right external iliac artery. Retrograde flow fills the hypogastric artery and the pelvic arteries. External iliac artery patent without significant atherosclerosis. Common femoral artery patent with mild posterior wall calcified plaque and no evidence of high-grade stenosis. Profunda femoris and the thigh branches patent. SFA patent with no significant atherosclerosis. Popliteal artery patent with no atherosclerosis. Typical trifurcation. Tibial arteries are patent proximally with small caliber posterior tibial artery. All 3 tibial arteries are patent to the ankle. Left lower extremity: Left graft limb patent with the anastomosis in the proximal left external iliac artery. Retrograde flow fills the hypogastric artery. Pelvic arteries patent. No evidence of aneurysm at the distal anastomosis. External iliac artery patent with minimal atherosclerosis. Common femoral artery patent with minimal atherosclerosis. Profunda femoris and thigh branches are patent. SFA patent throughout its length with no significant atherosclerosis. No significant popliteal artery atherosclerosis. Typical trifurcation. Tibial arteries patent proximally. Peroneal artery is diminutive. All 3 tibial arteries appear patent to the ankle. Veins: Timing of contrast bolus somewhat limits evaluation of the venous system, particularly for thrombus. Unremarkable IVC. 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 The superficial veins of the bilateral lower extremities are somewhat engorged, with superficial varicosities of the right greater than left lower legs. Associated edema. Review of the MIP images confirms the above findings. NON-VASCULAR Lower chest: Respiratory motion somewhat limits evaluation. Hepatobiliary: Unremarkable appearance of the liver. Cholecystectomy. Pancreas: Unremarkable. Spleen: Unremarkable. Adrenals/Urinary Tract: - Right adrenal gland: Unremarkable - Left adrenal gland: Unremarkable. - Right kidney: No hydronephrosis, nephrolithiasis, inflammation, or ureteral dilation. 13 mm low-density/hypoenhancing lesion within the posterior cortex of the right kidney, has grown since the comparison CT with low-density Hounsfield units most likely Bosniak 1 cyst. - Left Kidney: Atrophic left kidney without hydronephrosis. Relatively decreased perfusion with main renal artery occlusion. No focal lesion. - Urinary Bladder: Unremarkable. Stomach/Bowel: - Stomach: Hiatal hernia, otherwise unremarkable. - Small bowel: Unremarkable - Appendix: Normal. 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 No focal inflammatory changes. No evidence of obstruction. Lymphatic: No adenopathy. Mesenteric: No free fluid or air. No mesenteric adenopathy. Reproductive: Double uterus Other: Fat containing umbilical hernia. Musculoskeletal: No acute displaced fracture. Multilevel degenerative changes of the visualized thoracolumbar spine. Grade 1 anterolisthesis of L4 on L5. No significant bony canal narrowing. IMPRESSION: Surgical changes of prior aorto bi-iliac bypass, with no definite stenosis within the graft, and no anastomotic pseudoaneurysm. Native aortic atherosclerosis has progressed, now with estimated 50% narrowing at the level of the left renal artery secondary to coral reef calcified plaque, as well as a second, sequential calcified plaque just above the  anastomosis, with 50% or greater narrowing secondary to near circumferential involvement of the aorta. Aortic Atherosclerosis (ICD10-I70.0). Relatively minimal atherosclerotic disease of the external iliac arteries, femoropopliteal segments, and tibial arteries as above. 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 Atrophic and hypoenhancing left kidney, associated with the renal artery occlusion. Additional ancillary findings as above. Signed, Dulcy Fanny. Nadene Rubins, RPVI [facility] Radiology Electronically Signed   By:  Corrie Mckusick D.O.   On: 05/20/2022 15:23    Recent Labs: Lab Results  Component Value Date   WBC 9.1 04/13/2022   HGB 12.0 04/13/2022   PLT 229 04/13/2022   NA 141 04/13/2022   K 4.0 04/13/2022   CL 110 04/13/2022   CO2 25 04/13/2022   GLUCOSE 134 (H) 04/13/2022   BUN 23 04/13/2022   CREATININE 1.30 (H) 04/13/2022   BILITOT 0.6 02/27/2021   ALKPHOS 37 (L) 02/27/2021   AST 17 02/27/2021   ALT 16 02/27/2021   PROT 6.1 (L) 02/27/2021   ALBUMIN 3.7 02/27/2021   CALCIUM 9.4 04/13/2022   GFRAA 73 10/13/2019    Speciality Comments: No specialty comments available.  Procedures:  No procedures performed Allergies: Biotin, Lisinopril, Penicillin g, Atenolol, Duloxetine hcl, Lyrica [pregabalin], Penicillins, and Sulfamethoxazole-trimethoprim   Assessment / Plan:     Visit Diagnoses: Chronic right shoulder pain: Improved.  She has good range of motion of the right shoulder joint on examination today.  No tenderness upon  palpation.  Lateral epicondylitis, right elbow: Resolved.  She had a cortisone injection on 12/13/2019.  Primary osteoarthritis of both hands: She has PIP and DIP thickening consistent with osteoarthritis of both hands.  No synovitis was noted on examination today.  Complete fist formation noted bilaterally.  Trochanteric bursitis of both hips: She has ongoing tenderness palpation over bilateral trochanteric bursa.  She is been performing home exercises.  She was referred to physical therapy after her last office visit but did not notice any improvement in her symptoms.  She would not recommend going back to physical therapy at [facility] in the future. Patient was strongly encouraged to perform stretching exercises on a daily basis.  Cervical spondylolysis: C-spine has limited range of motion with lateral [place].  Sacro-iliac pain: No SI joint tenderness upon palpation today.  Fibromyalgia: She continues to experience generalized myalgias and muscle tenderness due to fibromyalgia.  She has not had any recent fibromyalgia flares.  She takes hydrocodone on a daily basis for pain relief which has been adequately controlling her symptoms.  If she experiences increased muscle spasms or muscle cramping she takes methocarbamol as needed for symptomatic relief.  Overall her energy level has been stable.  She has been taking Ambien 12.5 mg at bedtime for insomnia.  Discussed the importance of regular exercise and good sleep hygiene.  Patient was strongly encouraged to return to water therapy or water exercise to increase her level of activity.  Primary insomnia: She is taking Ambien 12.5 mg at bedtime for insomnia.  Discussed the importance of good sleep hygiene.  History of claudication: Under care of Dr. Donzetta Matters.  History of aortobi-iliac artery bypass in 1991 for severe claudication. She had a CT angiography performed on 05/19/2022.  She will be following up with Dr. Donzetta Matters on 05/28/2022 to discuss the results  and the next steps in management.   Other medical conditions are listed as follows:  Coronary artery disease status post CABG  History of depression  Paroxysmal atrial fibrillation [facility]): Patient is currently in A-fib.  History of hypertension  History of ulcerative colitis  History of hyperlipidemia  OSA (obstructive sleep apnea) - using CPAP now.  Osteoporosis screening - DEXA followed by PCP   Orders: No orders of the defined types were placed in this encounter.  No orders of the defined types were placed in this encounter.   Follow-Up Instructions: Return in about 6 months (around 11/21/2022) for Fibromyalgia, Osteoarthritis.   Lovena Le  Haskel Schroeder, PA-C  Note - This record has been created using [employer].  Chart creation errors have been sought, but may not always  have been located. Such creation errors do not reflect on  the standard of medical care.

## 2022-05-12 DIAGNOSIS — G4733 Obstructive sleep apnea (adult) (pediatric): Secondary | ICD-10-CM | POA: Diagnosis not present

## 2022-05-13 ENCOUNTER — Other Ambulatory Visit: Payer: Self-pay

## 2022-05-13 DIAGNOSIS — I70213 Atherosclerosis of native arteries of extremities with intermittent claudication, bilateral legs: Secondary | ICD-10-CM

## 2022-05-13 NOTE — Progress Notes (Signed)
Error

## 2022-05-19 ENCOUNTER — Ambulatory Visit (HOSPITAL_COMMUNITY)
Admission: RE | Admit: 2022-05-19 | Discharge: 2022-05-19 | Disposition: A | Discharge status: Home / Self Care | Payer: PPO | Source: Ambulatory Visit | Attending: Vascular Surgery | Admitting: Vascular Surgery

## 2022-05-19 DIAGNOSIS — K429 Umbilical hernia without obstruction or gangrene: Secondary | ICD-10-CM | POA: Diagnosis not present

## 2022-05-19 DIAGNOSIS — K551 Chronic vascular disorders of intestine: Secondary | ICD-10-CM | POA: Diagnosis not present

## 2022-05-19 DIAGNOSIS — I70213 Atherosclerosis of native arteries of extremities with intermittent claudication, bilateral legs: Secondary | ICD-10-CM | POA: Insufficient documentation

## 2022-05-19 DIAGNOSIS — N280 Ischemia and infarction of kidney: Secondary | ICD-10-CM | POA: Diagnosis not present

## 2022-05-19 DIAGNOSIS — N261 Atrophy of kidney (terminal): Secondary | ICD-10-CM | POA: Diagnosis not present

## 2022-05-19 MED ORDER — IOHEXOL 350 MG/ML SOLN
100.0000 mL | Freq: Once | INTRAVENOUS | Status: AC | PRN
  Administered 2022-05-19: 80 mL via INTRAVENOUS

## 2022-05-22 ENCOUNTER — Encounter: Payer: Self-pay | Admitting: Physician Assistant

## 2022-05-22 ENCOUNTER — Ambulatory Visit: Payer: PPO | Attending: Physician Assistant | Admitting: Physician Assistant

## 2022-05-22 ENCOUNTER — Telehealth: Payer: Self-pay | Admitting: Rheumatology

## 2022-05-22 VITALS — BP 104/67 | HR 98 | Resp 16 | Ht 64.0 in | Wt 193.0 lb

## 2022-05-22 DIAGNOSIS — M533 Sacrococcygeal disorders, not elsewhere classified: Secondary | ICD-10-CM | POA: Diagnosis not present

## 2022-05-22 DIAGNOSIS — M19042 Primary osteoarthritis, left hand: Secondary | ICD-10-CM

## 2022-05-22 DIAGNOSIS — G8929 Other chronic pain: Secondary | ICD-10-CM

## 2022-05-22 DIAGNOSIS — Z13820 Encounter for screening for osteoporosis: Secondary | ICD-10-CM

## 2022-05-22 DIAGNOSIS — Z8659 Personal history of other mental and behavioral disorders: Secondary | ICD-10-CM

## 2022-05-22 DIAGNOSIS — G4733 Obstructive sleep apnea (adult) (pediatric): Secondary | ICD-10-CM

## 2022-05-22 DIAGNOSIS — M7062 Trochanteric bursitis, left hip: Secondary | ICD-10-CM

## 2022-05-22 DIAGNOSIS — M7061 Trochanteric bursitis, right hip: Secondary | ICD-10-CM

## 2022-05-22 DIAGNOSIS — M25511 Pain in right shoulder: Secondary | ICD-10-CM

## 2022-05-22 DIAGNOSIS — Z8639 Personal history of other endocrine, nutritional and metabolic disease: Secondary | ICD-10-CM

## 2022-05-22 DIAGNOSIS — I2510 Atherosclerotic heart disease of native coronary artery without angina pectoris: Secondary | ICD-10-CM

## 2022-05-22 DIAGNOSIS — M19041 Primary osteoarthritis, right hand: Secondary | ICD-10-CM

## 2022-05-22 DIAGNOSIS — Z8679 Personal history of other diseases of the circulatory system: Secondary | ICD-10-CM

## 2022-05-22 DIAGNOSIS — Z8719 Personal history of other diseases of the digestive system: Secondary | ICD-10-CM

## 2022-05-22 DIAGNOSIS — F5101 Primary insomnia: Secondary | ICD-10-CM

## 2022-05-22 DIAGNOSIS — M4302 Spondylolysis, cervical region: Secondary | ICD-10-CM | POA: Diagnosis not present

## 2022-05-22 DIAGNOSIS — M7711 Lateral epicondylitis, right elbow: Secondary | ICD-10-CM | POA: Diagnosis not present

## 2022-05-22 DIAGNOSIS — M797 Fibromyalgia: Secondary | ICD-10-CM

## 2022-05-22 DIAGNOSIS — I480 Paroxysmal atrial fibrillation: Secondary | ICD-10-CM

## 2022-05-22 NOTE — Telephone Encounter (Signed)
Patient called to let Lovena Le know that she had a CT scan on Monday, 05/19/22 which is in [place] so she could review the results before her appointment today at 1:00 pm.

## 2022-05-28 ENCOUNTER — Encounter: Payer: Self-pay | Admitting: Vascular Surgery

## 2022-05-28 ENCOUNTER — Ambulatory Visit: Payer: PPO | Admitting: Vascular Surgery

## 2022-05-28 VITALS — BP 178/74 | HR 68 | Temp 98.3°F | Resp 20 | Ht 64.0 in | Wt 191.0 lb

## 2022-05-28 DIAGNOSIS — I70213 Atherosclerosis of native arteries of extremities with intermittent claudication, bilateral legs: Secondary | ICD-10-CM

## 2022-05-28 NOTE — Progress Notes (Signed)
Patient ID: Kara Bush, female   DOB: 02-Jun-1941, 81 y.o.   MRN: [id number]  Reason for Consult: Follow-up   Referred by Lurline Del, DO  Subjective:     HPI:  Kara Bush is a 81 y.o. female remote history of aortobiiliac bypass with recurrent bilateral lower extremity pain.  She now follows up with CT scan.  She has difficulty walking through the grocery store but mostly orders her groceries online does not like taking long distance walks or going anywhere where she may have to walk as she does have difficulty after short distances.  She does not have any tissue loss or ulceration.  Past Medical History:  Diagnosis Date   Abdominal aortic aneurysm ([place])    Anemia, improved 01/11/2013   Basal cell carcinoma    CAD (coronary artery disease)    hx CABG 1998, last cath 2007 with PCI and stenting to the proximal segment of the vein graft to the diagonal branch. DES Taxus was placed   Cervicalgia    Chest wall pain    Chronic fatigue fibromyalgia syndrome    Chronic pain syndrome    Coronary artery disease    Depression    Fibromyalgia    Fibromyalgia    History of nuclear stress test 08/07/2011   lexiscan; no evidence of ischemia or infarct; low risk    Hyperlipidemia    Hypertension    Hypertension    Obstructive sleep apnea    OSA (obstructive sleep apnea)    PAD (peripheral artery disease) (HCC)    Pes anserine bursitis    Pulmonary embolism ([place]) 01/11/2013   S/P resection of aortic aneurysm    Trochanteric bursitis    Ulcerative colitis ([place])    Family History  Problem Relation Age of Onset   Heart disease Father    Heart attack Father        @ 65   Cancer Paternal Aunt        BREAST   Heart failure Mother        at 41   Healthy Sister    Cancer Brother        pancreatic    Heart failure Maternal Grandmother    Heart attack Maternal Grandfather    Heart attack Paternal Grandmother        at 30   Healthy Daughter    Healthy Daughter    Past  Surgical History:  Procedure Laterality Date   AORTOBIEXTERNAL ILIAC BYPASS  1991   CATARACT EXTRACTION Left 09/2019   CHOLECYSTECTOMY  1997   CORONARY ANGIOPLASTY WITH STENT PLACEMENT  2006   to LAD, ATRETIC LIMA AT THAT TIME ALSO   CORONARY ANGIOPLASTY WITH STENT PLACEMENT  2007   STENT TO PROX VG-DIAG, OTHER STENTS PATENT   CORONARY ARTERY BYPASS GRAFT  1998   LIMA-LAD;VG-DIAG & RCA   CORONARY STENT PLACEMENT  2005   TO LAD & LCX-STENTS,   HERNIA REPAIR     TUBAL LIGATION     BTL    Short Social History:  Social History   Tobacco Use   Smoking status: Former    Packs/day: 2.50    Years: 20.00    Total pack years: 50.00    Types: Cigarettes    Quit date: 06/29/1990    Years since quitting: 31.9    Passive exposure: Never   Smokeless tobacco: Never  Substance Use Topics   Alcohol use: No    Allergies  Allergen Reactions  Biotin Hives   Lisinopril Cough   Penicillin G     Other reaction(s): rash   Atenolol Other (See Comments)    colitis Other reaction(s): Bowel issues   Duloxetine Hcl Other (See Comments)    Mad pt feel spacey Other reaction(s): shaky; tired in the PMs   Lyrica [Pregabalin] Other (See Comments)    Made pt feel spacey   Penicillins Rash   Sulfamethoxazole-Trimethoprim Nausea And Vomiting    Other reaction(s): elevated liver functions    Current Outpatient Medications  Medication Sig Dispense Refill   amLODipine (NORVASC) 5 MG tablet TAKE ONE TABLET BY MOUTH ONCE DAILY 90 tablet 1   apixaban (ELIQUIS) 5 MG TABS tablet Take 1 tablet by mouth 2 (two) times daily.     b complex vitamins capsule Take 1 capsule by mouth daily.     betamethasone dipropionate 0.26 % cream 1 application     citalopram (CELEXA) 20 MG tablet Take 1 tablet by mouth daily.     clobetasol ointment (TEMOVATE) 0.05 % APPLY THIN LAYER TO AFFECTED AREAS EXTERNALLY TWO TO THREE TIMES WEEKLY AS NEEDED 90 DAYS     ezetimibe (ZETIA) 10 MG tablet Take 1 tablet by mouth daily.      furosemide (LASIX) 40 MG tablet Take 1 tablet (40 mg total) by mouth as needed for fluid or edema (Daily as needed for swelling or weight gain of 2 pounds overnight or 5 pounds in a week). Take one tablet by mouth daily for 4 days 90 tablet 3   HYDROcodone-acetaminophen (NORCO/VICODIN) 5-325 MG tablet Take 1 tablet by mouth 4 (four) times daily as needed for moderate pain. May Take an extra 0.5 to one tablet when pain is severe. 140 tablet 0   methocarbamol (ROBAXIN) 500 MG tablet TAKE 1 TABLET BY MOUTH EVERY 8 HOURS AS NEEDED FOR MUSCLE SPASMS. 30 tablet 1   metoprolol succinate (TOPROL-XL) 25 MG 24 hr tablet Take 1 tablet by mouth daily.     metoprolol succinate (TOPROL-XL) 50 MG 24 hr tablet TAKE ONE TABLET BY MOUTH ONCE DAILY 90 tablet 1   Multiple Vitamin (MULITIVITAMIN WITH MINERALS) TABS Take 1 tablet by mouth daily.     NEOMYCIN-POLYMYXIN-DEXAMETH OP INSTILL 1 DROP(S) IN THE AFFECTED EYE 4 TIME(S) PER DAY X 5 DAYS     ondansetron (ZOFRAN) 4 MG tablet Take 1 tablet by mouth every 6 (six) hours as needed.     potassium chloride (KLOR-CON M) 10 MEQ tablet Take 1 tablet (10 mEq total) by mouth as needed (please take when you need take your lasix). 90 tablet 3   sacubitril-valsartan (ENTRESTO) 97-103 MG Take 1 tablet by mouth 2 (two) times daily. 180 tablet 1   simvastatin (ZOCOR) 40 MG tablet Take 1 tablet by mouth daily.     valsartan (DIOVAN) 160 MG tablet Take 1 tablet by mouth daily.     valsartan (DIOVAN) 80 MG tablet Take 1 tablet by mouth daily.     zolpidem (AMBIEN CR) 12.5 MG CR tablet Take 12.5 mg by mouth at bedtime as needed.     No current facility-administered medications for this visit.    Review of Systems  Constitutional:  Constitutional negative. HENT: HENT negative.  Eyes: Eyes negative.  Respiratory: Respiratory negative.  Cardiovascular: Positive for claudication.  GI: Gastrointestinal negative.  Musculoskeletal: Positive for leg pain.  Skin: Skin negative.   Neurological: Neurological negative. Hematologic: Hematologic/lymphatic negative.  Psychiatric: Psychiatric negative.        Objective:  Objective   Vitals:   05/28/22 1518  BP: (!) 178/74  Pulse: 68  Resp: 20  Temp: 98.3 F (36.8 C)  SpO2: 95%  Weight: 191 lb (86.6 kg)  Height: '5\' 4"'$  (1.626 m)   Body mass index is 32.79 kg/m.  Physical Exam HENT:     Head: Normocephalic.     Nose: Nose normal.  Eyes:     Pupils: Pupils are equal, round, and reactive to light.  Cardiovascular:     Rate and Rhythm: Normal rate.  Pulmonary:     Effort: Pulmonary effort is normal.  Abdominal:     General: Abdomen is flat.     Palpations: Abdomen is soft.  Musculoskeletal:     Cervical back: Normal range of motion and neck supple.     Right lower leg: No edema.     Left lower leg: No edema.  Skin:    General: Skin is warm and dry.     Capillary Refill: Capillary refill takes less than 2 seconds.  Neurological:     General: No focal deficit present.     Mental Status: She is alert.  Psychiatric:        Mood and Affect: Mood normal.        Behavior: Behavior normal.        Thought Content: Thought content normal.     Data: CT IMPRESSION: Surgical changes of prior aorto bi-iliac bypass, with no definite stenosis within the graft, and no anastomotic pseudoaneurysm.   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 Aortic Atherosclerosis (ICD10-I70.0).   Relatively minimal atherosclerotic disease of the external iliac arteries, femoropopliteal segments, and tibial arteries as above.   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   Atrophic and hypoenhancing left kidney, associated with the renal artery occlusion.       Assessment/Plan:    81 year-old female with recurrent bilateral lower extremity claudication from previous aortobiiliac artery bypass.  We reviewed her CT scan together and unfortunately her recurrence is above the bypass and the pararenal aorta and her left renal artery appears chronically occluded would require revascularization of the right renal artery and stenting of the aorta into the bypass.  Given that this would be high risk for an 81 year old patient either open or endovascular she is is going to follow-up in 1 year unless her symptoms significantly worsen.  At that time we will get aortoiliac duplex and ABIs.     Waynetta Sandy MD [facility]

## 2022-06-05 DIAGNOSIS — M792 Neuralgia and neuritis, unspecified: Secondary | ICD-10-CM | POA: Diagnosis not present

## 2022-06-05 DIAGNOSIS — M21961 Unspecified acquired deformity of right lower leg: Secondary | ICD-10-CM | POA: Diagnosis not present

## 2022-06-05 DIAGNOSIS — M21962 Unspecified acquired deformity of left lower leg: Secondary | ICD-10-CM | POA: Diagnosis not present

## 2022-06-05 DIAGNOSIS — B353 Tinea pedis: Secondary | ICD-10-CM | POA: Diagnosis not present

## 2022-06-05 DIAGNOSIS — I739 Peripheral vascular disease, unspecified: Secondary | ICD-10-CM | POA: Diagnosis not present

## 2022-06-12 ENCOUNTER — Other Ambulatory Visit (HOSPITAL_BASED_OUTPATIENT_CLINIC_OR_DEPARTMENT_OTHER): Payer: Self-pay | Admitting: Family

## 2022-06-12 DIAGNOSIS — G4733 Obstructive sleep apnea (adult) (pediatric): Secondary | ICD-10-CM | POA: Diagnosis not present

## 2022-06-12 NOTE — Telephone Encounter (Signed)
Pt of Dr. Debara Pickett. Please review for refill. Thank you!

## 2022-06-19 DIAGNOSIS — I509 Heart failure, unspecified: Secondary | ICD-10-CM | POA: Diagnosis not present

## 2022-06-19 DIAGNOSIS — Z6832 Body mass index (BMI) 32.0-32.9, adult: Secondary | ICD-10-CM | POA: Diagnosis not present

## 2022-06-23 ENCOUNTER — Encounter: Payer: Self-pay | Admitting: Registered Nurse

## 2022-06-23 ENCOUNTER — Encounter: Payer: PPO | Attending: Registered Nurse | Admitting: Registered Nurse

## 2022-06-23 VITALS — BP 144/78 | HR 74 | Ht 64.0 in | Wt 194.0 lb

## 2022-06-23 DIAGNOSIS — G894 Chronic pain syndrome: Secondary | ICD-10-CM

## 2022-06-23 DIAGNOSIS — M7062 Trochanteric bursitis, left hip: Secondary | ICD-10-CM | POA: Diagnosis not present

## 2022-06-23 DIAGNOSIS — M778 Other enthesopathies, not elsewhere classified: Secondary | ICD-10-CM | POA: Diagnosis not present

## 2022-06-23 DIAGNOSIS — G8929 Other chronic pain: Secondary | ICD-10-CM | POA: Insufficient documentation

## 2022-06-23 DIAGNOSIS — Z5181 Encounter for therapeutic drug level monitoring: Secondary | ICD-10-CM | POA: Diagnosis not present

## 2022-06-23 DIAGNOSIS — M7061 Trochanteric bursitis, right hip: Secondary | ICD-10-CM

## 2022-06-23 DIAGNOSIS — Z79891 Long term (current) use of opiate analgesic: Secondary | ICD-10-CM

## 2022-06-23 DIAGNOSIS — M797 Fibromyalgia: Secondary | ICD-10-CM | POA: Diagnosis not present

## 2022-06-23 DIAGNOSIS — M7581 Other shoulder lesions, right shoulder: Secondary | ICD-10-CM

## 2022-06-23 DIAGNOSIS — M545 Low back pain, unspecified: Secondary | ICD-10-CM | POA: Diagnosis not present

## 2022-06-23 MED ORDER — HYDROCODONE-ACETAMINOPHEN 5-325 MG PO TABS: 1.0000 | ORAL_TABLET | Freq: Four times a day (QID) | ORAL | 0 refills | Status: DC | PRN

## 2022-06-23 NOTE — Progress Notes (Signed)
Subjective:    Patient ID: Kara Bush, female    DOB: 05-24-1941, 81 y.o.   MRN: [id number]  HPI: Kara Bush is a 81 y.o. female who returns for follow up appointment for chronic pain and medication refill. He states his pain is located in her right shoulder, lower back and generalized joint paint. She rates her pain 4. Her current exercise regime is walking and performing stretching exercises.  Kara Bush Morphine equivalent is 21.78 MME.   Last UDS was Performed on 06/23/2022, it was consistent.    Pain Inventory Average Pain 4 Pain Right Now 4 My pain is aching  In the last 24 hours, has pain interfered with the following? General activity 3 Relation with others 0 Enjoyment of life 2 What TIME of day is your pain at its worst? varies Sleep (in general) Good  Pain is worse with: walking and bending Pain improves with: rest and medication Relief from Meds: 5  Family History  Problem Relation Age of Onset   Heart disease Father    Heart attack Father        @ 75   Cancer Paternal Aunt        BREAST   Heart failure Mother        at 57   Healthy Sister    Cancer Brother        pancreatic    Heart failure Maternal Grandmother    Heart attack Maternal Grandfather    Heart attack Paternal Grandmother        at 26   Healthy Daughter    Healthy Daughter    Social History   Socioeconomic History   Marital status: Divorced    Spouse name: Not on file   Number of children: 2   Years of education: college    Highest education level: Not on file  Occupational History    Employer: RETIRED  Tobacco Use   Smoking status: Former    Packs/day: 2.50    Years: 20.00    Total pack years: 50.00    Types: Cigarettes    Quit date: 06/29/1990    Years since quitting: 32.0    Passive exposure: Never   Smokeless tobacco: Never  Vaping Use   Vaping Use: Never used  Substance and Sexual Activity   Alcohol use: No   Drug use: No   Sexual activity: Not on  file  Other Topics Concern   Not on file  Social History Narrative   Not on file   Social Determinants of Health   Financial Resource Strain: Not on file  Food Insecurity: Not on file  Transportation Needs: Not on file  Physical Activity: Not on file  Stress: Not on file  Social Connections: Not on file   Past Surgical History:  Procedure Laterality Date   [place]   CATARACT EXTRACTION Left 09/2019   [place]  2006   to LAD, ATRETIC LIMA AT THAT TIME ALSO   CORONARY ANGIOPLASTY WITH STENT PLACEMENT  2007   STENT TO PROX VG-DIAG, OTHER STENTS PATENT   CORONARY ARTERY BYPASS GRAFT  1998   LIMA-LAD;VG-DIAG & RCA   CORONARY STENT PLACEMENT  2005   TO LAD & LCX-STENTS,   HERNIA REPAIR     TUBAL LIGATION     BTL   Past Surgical History:  Procedure Laterality Date   [place]   CATARACT  EXTRACTION Left 09/2019   CHOLECYSTECTOMY  1997   CORONARY ANGIOPLASTY WITH STENT PLACEMENT  2006   to LAD, ATRETIC LIMA AT THAT TIME ALSO   CORONARY ANGIOPLASTY WITH STENT PLACEMENT  2007   STENT TO PROX VG-DIAG, OTHER STENTS PATENT   CORONARY ARTERY BYPASS GRAFT  1998   LIMA-LAD;VG-DIAG & RCA   CORONARY STENT PLACEMENT  2005   TO LAD & LCX-STENTS,   HERNIA REPAIR     TUBAL LIGATION     BTL   Past Medical History:  Diagnosis Date   Abdominal aortic aneurysm (HCC)    Anemia, improved 01/11/2013   Basal cell carcinoma    CAD (coronary artery disease)    hx CABG 1998, last cath 2007 with PCI and stenting to the proximal segment of the vein graft to the diagonal branch. DES Taxus was placed   Cervicalgia    Chest wall pain    Chronic fatigue fibromyalgia syndrome    Chronic pain syndrome    Coronary artery disease    Depression    Fibromyalgia    Fibromyalgia    History of nuclear stress test 08/07/2011   lexiscan; no evidence of ischemia or infarct; low risk     Hyperlipidemia    Hypertension    Hypertension    Obstructive sleep apnea    OSA (obstructive sleep apnea)    PAD (peripheral artery disease) (HCC)    Pes anserine bursitis    Pulmonary embolism ([place]) 01/11/2013   S/P resection of aortic aneurysm    Trochanteric bursitis    Ulcerative colitis (HCC)    BP (!) 144/78   Pulse 74   Ht '5\' 4"'$  (1.626 m)   Wt 194 lb (88 kg)   SpO2 96%   BMI 33.30 kg/m   Opioid Risk Score:   Fall Risk Score:  `1  Depression screen PHQ 2/9     02/19/2022    1:40 PM 10/21/2021   11:08 AM 06/24/2021   10:48 AM 04/23/2021   10:41 AM 06/04/2020   11:11 AM 02/02/2020   10:59 AM 08/11/2019    9:31 AM  Depression screen PHQ 2/9  Decreased Interest 0 0 0 0 0 0 0  Down, Depressed, Hopeless 0 0 0 0 0 0 0  PHQ - 2 Score 0 0 0 0 0 0 0      Review of Systems  Musculoskeletal:  Positive for back pain.       Right shoulder pain  All other systems reviewed and are negative.     Objective:   Physical Exam Vitals and nursing note reviewed.  Constitutional:      Appearance: Normal appearance.  Cardiovascular:     Rate and Rhythm: Normal rate and regular rhythm.     Pulses: Normal pulses.     Heart sounds: Normal heart sounds.  Pulmonary:     Effort: Pulmonary effort is normal.     Breath sounds: Normal breath sounds.  Musculoskeletal:     Cervical back: Normal range of motion and neck supple.     Comments: Normal Muscle Bulk and Muscle Testing Reveals:  Upper Extremities: Full ROM and Muscle Strength 5/5 Right AC Joint Tenderness  Lumbar Paraspinal Tenderness: L-4-L-5 Bilateral Greater Trochanter Tenderness Lower Extremities: Full ROM and Muscle Strength 5/5 Arises from chair slowly Narrow Based  Gait     Skin:    General: Skin is warm and dry.  Neurological:     Mental Status: She is alert and oriented to  person, place, and time.  Psychiatric:        Mood and Affect: Mood normal.        Behavior: Behavior normal.         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 06/23/2022 2. Fibromyalgia: Continue  Home Exercise Program. Continue to Monitor.06/23/2022 3. Bilateral intermittent hand numbness: Carpal tunnel syndrome versus C6 radiculopathy mild: No complaints today. Continue to Monitor. 06/23/2022 4. Chronic Midline Low Back Pain/ LBP: Refilled: Norco 5/'325mg'$  # 140 pills--use one pill every 6 hours as needed for pain. A second prescription was e-scribe for the following month 06/23/2022. We will continue the opioid monitoring program, this consists of regular clinic visits, examinations, urine drug screen, pill counts as well as use of [place] Controlled Substance Reporting system. A 12 month History has been reviewed on the [place] Controlled Substance Reporting System on 06/23/2022.. 5. Muscle Spasm: Continue current medication regimen with  Robaxin. 06/23/2022 6.Bilateral  Greater Trochanteric Bursitis:Continue to Alternate with heat and ice therapy. Continue current medication regime. 06/23/2022  7. Left Foot pain: No complaints today.Continue with HEP as Tolerated. Continue to Monitor. 06/23/2022 8. Right Lateral Epicondylitis Pain: No complaints Today. Continue HEP as Tolerated. Continue to Monitor. 06/23/2022 9. Right Shoulder Tendonitis:  No complaints today. Continue Current Medication Regimen. Continue to Alternate Ice and Heat Therapy. 06/23/2022 10 Bilateral Chronic Knee Pain: Continue HEP as Tolerated. Continue to Monitor. 06/23/2022   F/U in 2 months

## 2022-07-12 DIAGNOSIS — G4733 Obstructive sleep apnea (adult) (pediatric): Secondary | ICD-10-CM | POA: Diagnosis not present

## 2022-07-19 ENCOUNTER — Other Ambulatory Visit: Payer: Self-pay | Admitting: Internal Medicine

## 2022-07-21 DIAGNOSIS — Z6834 Body mass index (BMI) 34.0-34.9, adult: Secondary | ICD-10-CM | POA: Diagnosis not present

## 2022-07-21 DIAGNOSIS — R059 Cough, unspecified: Secondary | ICD-10-CM | POA: Diagnosis not present

## 2022-07-21 DIAGNOSIS — J069 Acute upper respiratory infection, unspecified: Secondary | ICD-10-CM | POA: Diagnosis not present

## 2022-07-21 DIAGNOSIS — R051 Acute cough: Secondary | ICD-10-CM | POA: Diagnosis not present

## 2022-08-01 DIAGNOSIS — I482 Chronic atrial fibrillation, unspecified: Secondary | ICD-10-CM | POA: Diagnosis not present

## 2022-08-01 DIAGNOSIS — Z6833 Body mass index (BMI) 33.0-33.9, adult: Secondary | ICD-10-CM | POA: Diagnosis not present

## 2022-08-01 DIAGNOSIS — N1831 Chronic kidney disease, stage 3a: Secondary | ICD-10-CM | POA: Diagnosis not present

## 2022-08-01 DIAGNOSIS — F325 Major depressive disorder, single episode, in full remission: Secondary | ICD-10-CM | POA: Diagnosis not present

## 2022-08-01 DIAGNOSIS — Z66 Do not resuscitate: Secondary | ICD-10-CM | POA: Diagnosis not present

## 2022-08-13 DIAGNOSIS — H903 Sensorineural hearing loss, bilateral: Secondary | ICD-10-CM | POA: Diagnosis not present

## 2022-08-18 ENCOUNTER — Encounter: Payer: Self-pay | Admitting: Registered Nurse

## 2022-08-18 ENCOUNTER — Encounter: Payer: PPO | Attending: Registered Nurse | Admitting: Registered Nurse

## 2022-08-18 VITALS — BP 131/63 | HR 80 | Ht 64.0 in | Wt 198.0 lb

## 2022-08-18 DIAGNOSIS — M25561 Pain in right knee: Secondary | ICD-10-CM | POA: Diagnosis not present

## 2022-08-18 DIAGNOSIS — M7062 Trochanteric bursitis, left hip: Secondary | ICD-10-CM

## 2022-08-18 DIAGNOSIS — Z5181 Encounter for therapeutic drug level monitoring: Secondary | ICD-10-CM | POA: Insufficient documentation

## 2022-08-18 DIAGNOSIS — G894 Chronic pain syndrome: Secondary | ICD-10-CM | POA: Diagnosis not present

## 2022-08-18 DIAGNOSIS — M545 Low back pain, unspecified: Secondary | ICD-10-CM

## 2022-08-18 DIAGNOSIS — M797 Fibromyalgia: Secondary | ICD-10-CM | POA: Diagnosis not present

## 2022-08-18 DIAGNOSIS — M25562 Pain in left knee: Secondary | ICD-10-CM | POA: Diagnosis not present

## 2022-08-18 DIAGNOSIS — M7061 Trochanteric bursitis, right hip: Secondary | ICD-10-CM | POA: Diagnosis not present

## 2022-08-18 DIAGNOSIS — G8929 Other chronic pain: Secondary | ICD-10-CM | POA: Diagnosis not present

## 2022-08-18 DIAGNOSIS — Z79891 Long term (current) use of opiate analgesic: Secondary | ICD-10-CM | POA: Insufficient documentation

## 2022-08-18 MED ORDER — HYDROCODONE-ACETAMINOPHEN 5-325 MG PO TABS: 1.0000 | ORAL_TABLET | Freq: Four times a day (QID) | ORAL | 0 refills | Status: DC | PRN

## 2022-08-18 NOTE — Progress Notes (Signed)
Subjective:    Patient ID: Kara Bush, female    DOB: [birth year], 82 y.o.   MRN: [id number]  HPI: DESLYN Bush is a 82 y.o. female who returns for follow up appointment for chronic pain and medication refill.She  states her pain is located in  her mid- back, bilateral hip pain and bilateral knee pain. She rates her pain 4. Her current exercise regime is walking  short distances.   Ms. Hegg Morphine equivalent is 23.33 MME.  Last UDS was Performed on 10/24/2021, it was consistent.       Pain Inventory Average Pain 5 Pain Right Now 4 My pain is constant and aching  In the last 24 hours, has pain interfered with the following? General activity 4 Relation with others 1 Enjoyment of life 3 What TIME of day is your pain at its worst? morning  Sleep (in general) Fair  Pain is worse with: walking and bending Pain improves with: medication Relief from Meds: 3  Family History  Problem Relation Age of Onset   Heart disease Father    Heart attack Father        @ 5   Cancer Paternal Aunt        BREAST   Heart failure Mother        at 58   Healthy Sister    Cancer Brother        pancreatic    Heart failure Maternal Grandmother    Heart attack Maternal Grandfather    Heart attack Paternal Grandmother        at 46   Healthy Daughter    Healthy Daughter    Social History   Socioeconomic History   Marital status: Divorced    Spouse name: Not on file   Number of children: 2   Years of education: college    Highest education level: Not on file  Occupational History    Employer: RETIRED  Tobacco Use   Smoking status: Former    Packs/day: 2.50    Years: 20.00    Total pack years: 50.00    Types: Cigarettes    Quit date: 06/29/1990    Years since quitting: 32.1    Passive exposure: Never   Smokeless tobacco: Never  Vaping Use   Vaping Use: Never used  Substance and Sexual Activity   Alcohol use: No   Drug use: No   Sexual activity: Not on file   Other Topics Concern   Not on file  Social History Narrative   Not on file   Social Determinants of Health   Financial Resource Strain: Not on file  Food Insecurity: Not on file  Transportation Needs: Not on file  Physical Activity: Not on file  Stress: Not on file  Social Connections: Not on file   Past Surgical History:  Procedure Laterality Date   [place]   CATARACT EXTRACTION Left 09/2019   [place]  2006   to LAD, ATRETIC LIMA AT THAT TIME ALSO   CORONARY ANGIOPLASTY WITH STENT PLACEMENT  2007   STENT TO PROX VG-DIAG, OTHER STENTS PATENT   CORONARY ARTERY BYPASS GRAFT  1998   LIMA-LAD;VG-DIAG & RCA   CORONARY STENT PLACEMENT  2005   TO LAD & LCX-STENTS,   HERNIA REPAIR     TUBAL LIGATION     BTL   Past Surgical History:  Procedure Laterality Date   AORTOBIEXTERNAL ILIAC BYPASS  1991   CATARACT EXTRACTION Left 09/2019   CHOLECYSTECTOMY  1997   CORONARY ANGIOPLASTY WITH STENT PLACEMENT  2006   to LAD, ATRETIC LIMA AT THAT TIME ALSO   CORONARY ANGIOPLASTY WITH STENT PLACEMENT  2007   STENT TO PROX VG-DIAG, OTHER STENTS PATENT   CORONARY ARTERY BYPASS GRAFT  1998   LIMA-LAD;VG-DIAG & RCA   CORONARY STENT PLACEMENT  2005   TO LAD & LCX-STENTS,   HERNIA REPAIR     TUBAL LIGATION     BTL   Past Medical History:  Diagnosis Date   Abdominal aortic aneurysm (HCC)    Anemia, improved 01/11/2013   Basal cell carcinoma    CAD (coronary artery disease)    hx CABG 1998, last cath 2007 with PCI and stenting to the proximal segment of the vein graft to the diagonal branch. DES Taxus was placed   Cervicalgia    Chest wall pain    Chronic fatigue fibromyalgia syndrome    Chronic pain syndrome    Coronary artery disease    Depression    Fibromyalgia    Fibromyalgia    History of nuclear stress test 08/07/2011   lexiscan; no evidence of ischemia or infarct; low risk    Hyperlipidemia     Hypertension    Hypertension    Obstructive sleep apnea    OSA (obstructive sleep apnea)    PAD (peripheral artery disease) (HCC)    Pes anserine bursitis    Pulmonary embolism ([place]) 01/11/2013   S/P resection of aortic aneurysm    Trochanteric bursitis    Ulcerative colitis (HCC)    Ht '5\' 4"'$  (1.626 m)   Wt 198 lb (89.8 kg)   BMI 33.99 kg/m   Opioid Risk Score:   Fall Risk Score:  `1  Depression screen PHQ 2/9     06/23/2022    1:03 PM 02/19/2022    1:40 PM 10/21/2021   11:08 AM 06/24/2021   10:48 AM 04/23/2021   10:41 AM 06/04/2020   11:11 AM 02/02/2020   10:59 AM  Depression screen PHQ 2/9  Decreased Interest 1 0 0 0 0 0 0  Down, Depressed, Hopeless 1 0 0 0 0 0 0  PHQ - 2 Score 2 0 0 0 0 0 0    Review of Systems  Musculoskeletal:  Positive for back pain.       Pain in both hips & both knees      Objective:   Physical Exam Vitals and nursing note reviewed.  Constitutional:      Appearance: Normal appearance.  Cardiovascular:     Rate and Rhythm: Normal rate and regular rhythm.     Pulses: Normal pulses.     Heart sounds: Normal heart sounds.  Pulmonary:     Effort: Pulmonary effort is normal.     Breath sounds: Normal breath sounds.  Musculoskeletal:     Cervical back: Normal range of motion and neck supple.     Comments: Normal Muscle Bulk and Muscle Testing Reveals:  Upper Extremities: Full ROM and Muscle Strength  5/5 Thoracic Paraspinal Tenderness: T-3-T-7 Lumbar Paraspinal Tenderness: L-4-L-5 Bilateral Greater Trochanter Tenderness Lower Extremities: Full ROM and Muscle Strength 5/5 Arises from Chair slowly Narrow Based  Gait     Skin:    General: Skin is warm and dry.  Neurological:     Mental Status: She is alert and oriented to person, place, and time.  Psychiatric:        Mood  and Affect: Mood normal.        Behavior: Behavior normal.         Assessment & Plan:  1.Cervical spondylosis, cervicalgia with radiation to right scapular  region: Continue to Monitor, Continue Current Medication and exercise regime. 06/23/2022 2. Fibromyalgia: Continue  Home Exercise Program. Continue to Monitor.08/18/2022 3. Bilateral intermittent hand numbness: Carpal tunnel syndrome versus C6 radiculopathy mild: No complaints today. Continue to Monitor. 06/23/2022 4. Chronic Midline Low Back Pain/ LBP: Refilled: Norco 5/'325mg'$  # 140 pills--use one pill every 6 hours as needed for pain. A second prescription was e-scribe for the following month 08/18/2022 We will continue the opioid monitoring program, this consists of regular clinic visits, examinations, urine drug screen, pill counts as well as use of [place] Controlled Substance Reporting system. A 12 month History has been reviewed on the [place] on 08/18/2022. 5. Muscle Spasm: Continue current medication regimen with  Robaxin. 08/18/2022 6.Bilateral  Greater Trochanteric Bursitis:Continue to Alternate with heat and ice therapy. Continue current medication regime. 08/18/2022  7. Left Foot pain: No complaints today.Continue with HEP as Tolerated. Continue to Monitor. 08/18/2022 8. Right Lateral Epicondylitis Pain: No complaints Today. Continue HEP as Tolerated. Continue to Monitor. 08/18/2022 9. Right Shoulder Tendonitis:  No complaints today. Continue Current Medication Regimen. Continue to Alternate Ice and Heat Therapy. 08/18/2022 10 Bilateral Chronic Knee Pain: Continue HEP as Tolerated. Continue to Monitor. 08/18/2022   F/U in 2 months

## 2022-08-27 ENCOUNTER — Ambulatory Visit (HOSPITAL_COMMUNITY): Payer: PPO | Attending: Cardiology

## 2022-08-27 DIAGNOSIS — I350 Nonrheumatic aortic (valve) stenosis: Secondary | ICD-10-CM | POA: Diagnosis not present

## 2022-08-27 DIAGNOSIS — I351 Nonrheumatic aortic (valve) insufficiency: Secondary | ICD-10-CM

## 2022-08-27 LAB — ECHOCARDIOGRAM COMPLETE
AR max vel: 1.13 cm2
AV Area VTI: 1.15 cm2
AV Area mean vel: 1.11 cm2
AV Mean grad: 16.8 mmHg
AV Peak grad: 28.9 mmHg
Ao pk vel: 2.69 m/s
Area-P 1/2: 2.71 cm2
P 1/2 time: 351 msec
S' Lateral: 2.7 cm

## 2022-09-15 DIAGNOSIS — H02052 Trichiasis without entropian right lower eyelid: Secondary | ICD-10-CM | POA: Diagnosis not present

## 2022-09-24 DIAGNOSIS — K6289 Other specified diseases of anus and rectum: Secondary | ICD-10-CM | POA: Diagnosis not present

## 2022-09-24 DIAGNOSIS — K600 Acute anal fissure: Secondary | ICD-10-CM | POA: Diagnosis not present

## 2022-09-24 DIAGNOSIS — K625 Hemorrhage of anus and rectum: Secondary | ICD-10-CM | POA: Diagnosis not present

## 2022-09-25 ENCOUNTER — Encounter: Payer: Self-pay | Admitting: Internal Medicine

## 2022-09-25 ENCOUNTER — Ambulatory Visit: Payer: PPO | Attending: Internal Medicine | Admitting: Internal Medicine

## 2022-09-25 VITALS — BP 128/60 | HR 82 | Ht 64.0 in | Wt 197.0 lb

## 2022-09-25 DIAGNOSIS — I739 Peripheral vascular disease, unspecified: Secondary | ICD-10-CM | POA: Diagnosis not present

## 2022-09-25 DIAGNOSIS — I359 Nonrheumatic aortic valve disorder, unspecified: Secondary | ICD-10-CM

## 2022-09-25 DIAGNOSIS — I5032 Chronic diastolic (congestive) heart failure: Secondary | ICD-10-CM

## 2022-09-25 DIAGNOSIS — Z951 Presence of aortocoronary bypass graft: Secondary | ICD-10-CM

## 2022-09-25 MED ORDER — FUROSEMIDE 40 MG PO TABS: 40.0000 mg | ORAL_TABLET | ORAL | 1 refills | Status: DC

## 2022-09-25 MED ORDER — POTASSIUM CHLORIDE CRYS ER 10 MEQ PO TBCR: 10.0000 meq | EXTENDED_RELEASE_TABLET | ORAL | 3 refills | Status: DC

## 2022-09-25 NOTE — Patient Instructions (Signed)
Medication Instructions:  INCREASE lasix and potassium to 3 times weekly  *If you need a refill on your cardiac medications before your next appointment, please call your pharmacy*   Follow-Up: At [facility], you and your health needs are our priority.  As part of our continuing mission to provide you with exceptional heart care, we have created designated Provider Care Teams.  These Care Teams include your primary Cardiologist (physician) and Advanced Practice Providers (APPs -  Physician Assistants and Nurse Practitioners) who all work together to provide you with the care you need, when you need it.  We recommend signing up for the patient portal called "MyChart".  Sign up information is provided on this After Visit Summary.  MyChart is used to connect with patients for Virtual Visits (Telemedicine).  Patients are able to view lab/test results, encounter notes, upcoming appointments, etc.  Non-urgent messages can be sent to your provider as well.   To learn more about what you can do with MyChart, go to [email or web address].    Your next appointment:    6 months with Dr. Debara Pickett

## 2022-09-25 NOTE — Progress Notes (Signed)
09/25/2022   PCP: Lurline Del, DO  CC: Shortness of breath, edema  HPI: 82 year old white female with a history of coronary artery disease as well as peripheral vascular disease. She had bypass grafting in 1998 and stents in 2005 2006 and 2000 710 negative nuclear stress test in 2013 she also has peripheral arterial disease and history of aortic iliac bypass in 1991. Last ABIs were made of 2013 were normal.  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  Fortunately she was asymptomatic, she was placed on anticoagulation with the Xarelto.   She has done well since that time.  Her last visit with Dr. Debara Pickett was in February and he was planning to recheck d-dimer and if it was still elevated she would need to continue her Xarelto for total of 6 months.  Her d-dimer continues to be elevated at 0.67. Initial d-dimer in February was 1.48.  Venous Dopplers were also done in February which were negative for DVT.    Kara Bush just had a repeat D. dimer which was elevated at 1.48, actually slightly higher than it had been before. She tells a story that over the past several weeks she's had a urinary tract infection and started taking Bactrim for which she is more nauseated and is actually thrown up. She reports some tenderness across her epigastric area as well as the costovertebral angles. She's also had fever, chills and some sweating at night. She is planning on seeing her primary care doctor back in the office this afternoon. She denies any worsening shortness of breath or chest pain. He's had no bleeding complications on Xarelto.  Kara Bush returns today for follow-up. She recently reports some shortness of breath with exertion. She occasionally gets twinges in her chest however it is not reminiscent of her prior angina. Nevertheless her last stress test was 2 years ago. I think a lot of her  shortness of breath is due to weight gain and minimal exercise. She does water exercise but not water aerobics.  I saw Kara Bush today in follow-up of her stress test and lower extremity Dopplers. Unfortunately she did not get the lower extremity Dopplers. She did undergo a metabolic stress testing. This showed no circulatory limitation to exercise. The main factors in her shortness of breath seems to be obesity and some pulmonary restriction. Currently she denies any significant leg claudication.  07/03/2106  Kara Bush returns today for follow-up. Overall she's feeling fairly well. She had lower chimney arterial Dopplers a year ago which showed normal ABIs. She gets some occasional pain, burning and itching in her extremities which may be related to fibromyalgia. She denies any worsening chest pain or shortness of breath with exertion.  08/10/2017  Kara Bush was seen today in follow-up.  She says she is fairly stable with regards to her symptoms compared to last year.  She gets short of breath, when doing moderate to heavy exertion.  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  She had slight worsening of her right lower extremity ABI last year but denies any worsening claudication with exertion.  Blood pressures been well controlled at home.  She has a physical with routine blood work in 3 months.  08/17/2018  Kara Bush returns today for routine follow-up.  Is been a year since I last  saw her.  She had an echocardiogram which showed a stable dilated aortic root.  We had planned to repeat CT angiography of the aorta around this time.  She also had had lower extremity Dopplers but denies any worsening claudication.  This showed mild bilateral lower extremity arterial disease.  She reports continued exercise and does do water aerobics.  She struggles with fibromyalgia and is on long-term opiate therapy.  Labs from March 2019 showed  total cholesterol 141, HDL 50, LDL 64 and triglycerides 134.  09/09/2018  Kara Bush is seen today as an add-on for new A. fib.  I just saw her a couple weeks ago.  At the time she was in sinus rhythm and doing well.  Today she was in for Dopplers and was noted to be in an irregular rhythm.  EKG was performed which shows newly identified atrial fibrillation with controlled ventricular response at 81.  Subsequently she reported that she has been having some episodes of palpitations on and off however it was not clear whether this could have been related to her fibromyalgia or any other disorder.  She is also had a few episodes of presyncope.  06/23/2019  Kara Bush returns today for follow-up of A. fib.  Overall she is feeling well.  Her EKG today shows in sinus rhythm.  She has been tolerating Xarelto however the cost is concerning for her.  She is interested in something else.  We discussed possibly switching over to Eliquis.  Recently she held the [place] prior to colonoscopy which was uneventful and then it was restarted.  She is also noted elevated blood pressures recently.  She is only on low-dose lisinopril.  08/17/2019  Kara Bush is seen today in follow-up.  I last saw her about a couple months ago.  She returns today for follow-up of blood pressure.  I previously increased her lisinopril however she remains hypertensive.  She is done well after transitioning from Xarelto to Eliquis.  She is currently only on low-dose lisinopril 10 mg daily.  04/06/2020  Kara Bush returns today for follow-up.  Overall she is doing well without any specific complaints.  LDL was 63 as of March 2011.  Blood pressure today is 140/72.  She is demonstrating sinus rhythm with first-degree AV block and some PACs and PVCs on her EKG but no evidence of A. fib.  She is on Eliquis and denies any bleeding difficulty.  She does get some occasional breakthrough palpitations.  She is only on low-dose  metoprolol.  04/02/2022  Kara Bush returns today for follow-up.  Overall she says she is doing well.  She denies any chest pain but does note she gets short of breath with minimal exertion.  It sounds like she is fairly sedentary.  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  She will have these annually.  She reports on her Apple Watch that she has paroxysmal atrial fibrillation.  I did review this and showed some episodes of A-fib although her EKG today shows sinus rhythm with PACs.  She brought a blood pressure log with her which shows relatively good control of her blood pressures generally around the 130s over 70s.  Blood pressure today was 130/76.  She has not had follow-up lower extremity arterial Dopplers and does have a history of aorto ileal bypass in the past.  She was sent to vascular surgery however they did not follow-up  with her recently.   09/25/2022  Kara Bush returns today for follow-up.  She just had a repeat echo which shows normal LVEF 60 to 65%, mild LVH and mild LAE, mild to moderate MR, severe TR and stable mild to moderate AS with a mean gradient of 16.8 mmHg.  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  Blood pressure appears better controlled than it has in the past.  EKG today shows a normal sinus rhythm.  She denies any recurrent A-fib.  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  She does have an intact right kidney.  She has had some recent rectal bleeding was seen yesterday by Dr. Benson Norway and found to have an anal fissure which she is undergoing treatment for.  She appears somewhat pale today but says that she has not been anemic.  Hemoglobin in the fall was 12 g/dL.     Allergies  Allergen Reactions   Biotin Hives   Lisinopril Cough   Penicillin G     Other reaction(s): rash   Atenolol  Other (See Comments)    colitis Other reaction(s): Bowel issues   Duloxetine Hcl Other (See Comments)    Mad pt feel spacey Other reaction(s): shaky; tired in the PMs   Lyrica [Pregabalin] Other (See Comments)    Made pt feel spacey   Penicillins Rash   Sulfamethoxazole-Trimethoprim Nausea And Vomiting    Other reaction(s): elevated liver functions    Current Outpatient Medications  Medication Sig Dispense Refill   amLODipine (NORVASC) 5 MG tablet TAKE ONE TABLET BY MOUTH ONCE daily 90 tablet 3   b complex vitamins capsule Take 1 capsule by mouth daily.     betamethasone dipropionate [id number] % cream 1 application     citalopram (CELEXA) 20 MG tablet Take 1 tablet by mouth daily.     clobetasol ointment (TEMOVATE) 0.05 % APPLY THIN LAYER TO AFFECTED AREAS EXTERNALLY TWO TO THREE TIMES WEEKLY AS NEEDED 90 DAYS     ELIQUIS 5 MG TABS tablet TAKE ONE TABLET BY MOUTH twice daily 180 tablet 2   ENTRESTO 97-103 MG TAKE ONE TABLET BY MOUTH TWICE DAILY 180 tablet 1   ezetimibe (ZETIA) 10 MG tablet TAKE ONE TABLET BY MOUTH ONCE daily 90 tablet 3   furosemide (LASIX) 40 MG tablet Take 1 tablet (40 mg total) by mouth as needed for fluid or edema (Daily as needed for swelling or weight gain of 2 pounds overnight or 5 pounds in a week). Take one tablet by mouth daily for 4 days 90 tablet 3   HYDROcodone-acetaminophen (NORCO/VICODIN) 5-325 MG tablet Take 1 tablet by mouth 4 (four) times daily as needed for moderate pain. May Take an extra 0.5 to one tablet when pain is severe. 140 tablet 0   methocarbamol (ROBAXIN) 500 MG tablet TAKE 1 TABLET BY MOUTH EVERY 8 HOURS AS NEEDED FOR MUSCLE SPASMS. 30 tablet 1   metoprolol succinate (TOPROL-XL) 50 MG 24 hr tablet TAKE ONE TABLET BY MOUTH ONCE DAILY 90 tablet 1   Multiple Vitamin (MULITIVITAMIN WITH MINERALS) TABS Take 1 tablet by mouth daily.     NEOMYCIN-POLYMYXIN-DEXAMETH OP INSTILL 1 DROP(S) IN THE AFFECTED EYE 4 TIME(S) PER DAY X 5 DAYS     potassium  chloride (KLOR-CON M) 10 MEQ tablet Take 1 tablet (10 mEq total) by mouth as needed (please take when you need take your lasix). 90 tablet 3   simvastatin (  ZOCOR) 40 MG tablet TAKE ONE TABLET BY MOUTH ONCE daily 90 tablet 3   zolpidem (AMBIEN CR) 12.5 MG CR tablet Take 12.5 mg by mouth at bedtime as needed.     metoprolol succinate (TOPROL-XL) 25 MG 24 hr tablet Take 1 tablet by mouth daily. (Patient not taking: Reported on 09/25/2022)     ondansetron (ZOFRAN) 4 MG tablet Take 1 tablet by mouth every 6 (six) hours as needed. (Patient not taking: Reported on 09/25/2022)     valsartan (DIOVAN) 160 MG tablet Take 1 tablet by mouth daily. (Patient not taking: Reported on 09/25/2022)     valsartan (DIOVAN) 80 MG tablet Take 1 tablet by mouth daily. (Patient not taking: Reported on 09/25/2022)     No current facility-administered medications for this visit.    Past Medical History:  Diagnosis Date   Abdominal aortic aneurysm ([place])    Anemia, improved 01/11/2013   Basal cell carcinoma    CAD (coronary artery disease)    hx CABG 1998, last cath 2007 with PCI and stenting to the proximal segment of the vein graft to the diagonal branch. DES Taxus was placed   Cervicalgia    Chest wall pain    Chronic fatigue fibromyalgia syndrome    Chronic pain syndrome    Coronary artery disease    Depression    Fibromyalgia    Fibromyalgia    History of nuclear stress test 08/07/2011   lexiscan; no evidence of ischemia or infarct; low risk    Hyperlipidemia    Hypertension    Hypertension    Obstructive sleep apnea    OSA (obstructive sleep apnea)    PAD (peripheral artery disease) (HCC)    Pes anserine bursitis    Pulmonary embolism ([place]) 01/11/2013   S/P resection of aortic aneurysm    Trochanteric bursitis    Ulcerative colitis ([place])     Past Surgical History:  Procedure Laterality Date   AORTOBIEXTERNAL ILIAC BYPASS  1991   CATARACT EXTRACTION Left 09/2019   CHOLECYSTECTOMY  1997   CORONARY  ANGIOPLASTY WITH STENT PLACEMENT  2006   to LAD, ATRETIC LIMA AT THAT TIME ALSO   CORONARY ANGIOPLASTY WITH STENT PLACEMENT  2007   STENT TO PROX VG-DIAG, OTHER STENTS PATENT   CORONARY ARTERY BYPASS GRAFT  1998   LIMA-LAD;VG-DIAG & RCA   CORONARY STENT PLACEMENT  2005   TO LAD & LCX-STENTS,   HERNIA REPAIR     TUBAL LIGATION     BTL   EKG: Normal sinus rhythm with sinus arrhythmia at 82-personally reviewed  ROS: Pertinent items noted in HPI and remainder of comprehensive ROS otherwise negative.  PHYSICAL EXAM BP 128/60   Pulse 82   Ht 5' 4"$  (1.626 m)   Wt 197 lb (89.4 kg)   BMI 33.81 kg/m  General appearance: alert, no distress, and moderately obese Neck: no carotid bruit and no JVD Lungs: clear to auscultation bilaterally Heart: regular rate and rhythm and systolic murmur: systolic ejection 3/6, crescendo at 2nd right intercostal space Abdomen: soft, non-tender; bowel sounds normal; no masses,  no organomegaly Extremities: extremities normal, atraumatic, no cyanosis or edema Pulses: 2+ and symmetric Skin: Pale, cool, dry Neurologic: Grossly normal Psych: Pleasant  ASSESSMENT AND PLAN Patient Active Problem List   Diagnosis Date Noted   A-fib ([place]) 02/26/2021   Atrial fibrillation ([place]) 09/09/2018   DOE (dyspnea on exertion) 08/10/2017   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   Greater trochanteric bursitis of right  hip 11/07/2014   Sacro-iliac pain 11/07/2014   Chronic midline low back pain without sciatica 11/07/2013   Hepatitis 04/20/2013   Screening for STD (sexually transmitted disease) 04/20/2013   Recurrent UTI 04/20/2013   Abdominal aortic aneurysm (HCC)    Ulcerative colitis ([place])    Obstructive sleep apnea    Fibromyalgia    Coronary artery disease    Basal cell carcinoma    Anemia, improved 01/11/2013   Ascending aortic aneurysm, 4.1 cm 01/11/2013   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     Hypertension    PLAN: Kara Bush has had some recent shortness of breath.  I feel that she may be a little volume overloaded.  Her echo showed severe TR and mild to moderate AAS with at least mild MR.  There was also mild to moderate aortic insufficiency.  Aortic valve is bicuspid.  I feel that she needs more diuresis and advised her to increase her Lasix to 40 mg 3 times a week with the ability to use a sliding scale.  She is on high-dose Entresto.  Hopefully this will additionally help her.  Will plan follow-up in about 6 months or sooner as necessary.  Pixie Casino, MD, [facility], [place] Director of the Advanced Lipid Disorders &  Cardiovascular Risk Reduction Clinic Diplomate of the American Board of Clinical Lipidology Attending Cardiologist  Direct Dial: [phone]  Fax: [phone]  Website:  [email or web address]

## 2022-09-26 DIAGNOSIS — H02052 Trichiasis without entropian right lower eyelid: Secondary | ICD-10-CM | POA: Diagnosis not present

## 2022-10-17 ENCOUNTER — Encounter: Payer: Self-pay | Admitting: Registered Nurse

## 2022-10-17 ENCOUNTER — Encounter: Payer: PPO | Attending: Registered Nurse | Admitting: Registered Nurse

## 2022-10-17 VITALS — BP 121/69 | HR 82 | Ht 64.0 in | Wt 198.4 lb

## 2022-10-17 DIAGNOSIS — M545 Low back pain, unspecified: Secondary | ICD-10-CM | POA: Diagnosis not present

## 2022-10-17 DIAGNOSIS — Z5181 Encounter for therapeutic drug level monitoring: Secondary | ICD-10-CM | POA: Insufficient documentation

## 2022-10-17 DIAGNOSIS — G8929 Other chronic pain: Secondary | ICD-10-CM | POA: Diagnosis not present

## 2022-10-17 DIAGNOSIS — M797 Fibromyalgia: Secondary | ICD-10-CM | POA: Insufficient documentation

## 2022-10-17 DIAGNOSIS — M7062 Trochanteric bursitis, left hip: Secondary | ICD-10-CM | POA: Insufficient documentation

## 2022-10-17 DIAGNOSIS — M7061 Trochanteric bursitis, right hip: Secondary | ICD-10-CM | POA: Insufficient documentation

## 2022-10-17 DIAGNOSIS — G894 Chronic pain syndrome: Secondary | ICD-10-CM | POA: Diagnosis not present

## 2022-10-17 DIAGNOSIS — Z79891 Long term (current) use of opiate analgesic: Secondary | ICD-10-CM | POA: Diagnosis not present

## 2022-10-17 MED ORDER — HYDROCODONE-ACETAMINOPHEN 5-325 MG PO TABS: 1.0000 | ORAL_TABLET | Freq: Four times a day (QID) | ORAL | 0 refills | Status: DC | PRN

## 2022-10-17 NOTE — Progress Notes (Signed)
Subjective:    Patient ID: Kara Bush, female    DOB: [birth year], 82 y.o.   MRN: [id number]  HPI: Kara Bush is a 82 y.o. female who returns for follow up appointment for chronic pain and medication refill. She states her pain is located in her mid- back and bilateral hips.. She rates her pain 4. Her current exercise regime is walking short distances and performing stretching exercises.  Kara Bush reports last night she had experience diarrhea, she believe it was related to the nuts she was eating. Today she states the diarrhea has resolved, she will F/U with her PCP if diarrhea returns. She verbalizes understanding.   Kara Bush Morphine equivalent is 24.11 MME.   UDS ordered today.      Pain Inventory Average Pain 4 Pain Right Now 4 My pain is constant, dull, and aching  In the last 24 hours, has pain interfered with the following? General activity 5 Relation with others 2 Enjoyment of life 6 What TIME of day is your pain at its worst? morning  Sleep (in general) Good  Pain is worse with: walking and some activites Pain improves with: rest and medication Relief from Meds: 5  Family History  Problem Relation Age of Onset   Heart disease Father    Heart attack Father        @ 60   Cancer Paternal Aunt        BREAST   Heart failure Mother        at 75   Healthy Sister    Cancer Brother        pancreatic    Heart failure Maternal Grandmother    Heart attack Maternal Grandfather    Heart attack Paternal Grandmother        at 58   Healthy Daughter    Healthy Daughter    Social History   Socioeconomic History   Marital status: Divorced    Spouse name: Not on file   Number of children: 2   Years of education: college    Highest education level: Not on file  Occupational History    Employer: RETIRED  Tobacco Use   Smoking status: Former    Packs/day: 2.50    Years: 20.00    Additional pack years: 0.00    Total pack years: 50.00    Types:  Cigarettes    Quit date: 06/29/1990    Years since quitting: 32.3    Passive exposure: Never   Smokeless tobacco: Never  Vaping Use   Vaping Use: Never used  Substance and Sexual Activity   Alcohol use: No   Drug use: No   Sexual activity: Not on file  Other Topics Concern   Not on file  Social History Narrative   Not on file   Social Determinants of Health   Financial Resource Strain: Not on file  Food Insecurity: Not on file  Transportation Needs: Not on file  Physical Activity: Not on file  Stress: Not on file  Social Connections: Not on file   Past Surgical History:  Procedure Laterality Date   [place]   CATARACT EXTRACTION Left 09/2019   CHOLECYSTECTOMY  1997   CORONARY ANGIOPLASTY WITH STENT PLACEMENT  2006   to LAD, ATRETIC LIMA AT THAT TIME ALSO   CORONARY ANGIOPLASTY WITH STENT PLACEMENT  2007   STENT TO PROX VG-DIAG, OTHER STENTS PATENT   CORONARY ARTERY BYPASS GRAFT  1998   LIMA-LAD;VG-DIAG &  RCA   CORONARY STENT PLACEMENT  2005   TO LAD & LCX-STENTS,   HERNIA REPAIR     TUBAL LIGATION     BTL   Past Surgical History:  Procedure Laterality Date   AORTOBIEXTERNAL ILIAC BYPASS  1991   CATARACT EXTRACTION Left 09/2019   CHOLECYSTECTOMY  1997   CORONARY ANGIOPLASTY WITH STENT PLACEMENT  2006   to LAD, ATRETIC LIMA AT THAT TIME ALSO   CORONARY ANGIOPLASTY WITH STENT PLACEMENT  2007   STENT TO PROX VG-DIAG, OTHER STENTS PATENT   CORONARY ARTERY BYPASS GRAFT  1998   LIMA-LAD;VG-DIAG & RCA   CORONARY STENT PLACEMENT  2005   TO LAD & LCX-STENTS,   HERNIA REPAIR     TUBAL LIGATION     BTL   Past Medical History:  Diagnosis Date   Abdominal aortic aneurysm (HCC)    Anemia, improved 01/11/2013   Basal cell carcinoma    CAD (coronary artery disease)    hx CABG 1998, last cath 2007 with PCI and stenting to the proximal segment of the vein graft to the diagonal branch. DES Taxus was placed   Cervicalgia    Chest wall pain     Chronic fatigue fibromyalgia syndrome    Chronic pain syndrome    Coronary artery disease    Depression    Fibromyalgia    Fibromyalgia    History of nuclear stress test 08/07/2011   lexiscan; no evidence of ischemia or infarct; low risk    Hyperlipidemia    Hypertension    Hypertension    Obstructive sleep apnea    OSA (obstructive sleep apnea)    PAD (peripheral artery disease) (HCC)    Pes anserine bursitis    Pulmonary embolism ([place]) 01/11/2013   S/P resection of aortic aneurysm    Trochanteric bursitis    Ulcerative colitis (HCC)    BP 121/69   Pulse 82   Ht 5\' 4"  (1.626 m)   Wt 198 lb 6.4 oz (90 kg)   SpO2 90%   BMI 34.06 kg/m   Opioid Risk Score:   Fall Risk Score:  `1  Depression screen PHQ 2/9     10/17/2022    1:38 PM 08/18/2022    1:15 PM 06/23/2022    1:03 PM 02/19/2022    1:40 PM 10/21/2021   11:08 AM 06/24/2021   10:48 AM 04/23/2021   10:41 AM  Depression screen PHQ 2/9  Decreased Interest 0 0 1 0 0 0 0  Down, Depressed, Hopeless 0 0 1 0 0 0 0  PHQ - 2 Score 0 0 2 0 0 0 0    Review of Systems  Constitutional: Negative.   HENT: Negative.    Eyes: Negative.   Respiratory: Negative.    Cardiovascular: Negative.   Gastrointestinal: Negative.   Endocrine: Negative.   Genitourinary: Negative.   Musculoskeletal:  Positive for arthralgias and myalgias.  Skin: Negative.   Allergic/Immunologic: Negative.   Hematological:  Bruises/bleeds easily.  Psychiatric/Behavioral: Negative.    All other systems reviewed and are negative.      Objective:   Physical Exam Vitals and nursing note reviewed.  Constitutional:      Appearance: Normal appearance.  Cardiovascular:     Rate and Rhythm: Normal rate and regular rhythm.     Pulses: Normal pulses.     Heart sounds: Normal heart sounds.  Pulmonary:     Effort: Pulmonary effort is normal.     Breath sounds: Normal breath  sounds.  Musculoskeletal:     Cervical back: Normal range of motion and neck supple.      Comments: Normal Muscle Bulk and Muscle Testing Reveals:  Upper Extremities: Full ROM and Muscle Strength 5/5  Thoracic Paraspinal Tenderness: T-7-T-9 Bilateral Greater Trochanter Tenderness Lower Extremities: Full ROM and Muscle Strength 5/5 Arises from Table slowly Narrow Based Gait       Skin:    General: Skin is warm and dry.  Neurological:     Mental Status: She is alert and oriented to person, place, and time.  Psychiatric:        Mood and Affect: Mood normal.        Behavior: Behavior normal.         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 10/17/2022 2. Fibromyalgia: Continue  Home Exercise Program. Continue to Monitor.10/17/2022 3. Bilateral intermittent hand numbness: Carpal tunnel syndrome versus C6 radiculopathy mild: No complaints today. Continue to Monitor. 10/17/2022 4. Chronic Midline Low Back Pain/ LBP: Refilled: Norco 5/325mg  # 140 pills--use one pill every 6 hours as needed for pain. A second prescription was e-scribe for the following month 10/17/2022 We will continue the opioid monitoring program, this consists of regular clinic visits, examinations, urine drug screen, pill counts as well as use of [place] Controlled Substance Reporting system. A 12 month History has been reviewed on the [place] on 10/17/2022. 5. Muscle Spasm: Continue current medication regimen with  Robaxin. 10/17/2022 6.Bilateral  Greater Trochanteric Bursitis:Continue to Alternate with heat and ice therapy. Continue current medication regime. 10/17/2022  7. Left Foot pain: No complaints today.Continue with HEP as Tolerated. Continue to Monitor. 10/17/2022 8. Right Lateral Epicondylitis Pain: No complaints Today. Continue HEP as Tolerated. Continue to Monitor. 10/17/2022 9. Right Shoulder Tendonitis:  No complaints  today. Continue Current Medication Regimen. Continue to Alternate Ice and Heat Therapy. 10/17/2022 10 Bilateral Chronic Knee Pain: No complaints today.  Continue HEP as Tolerated. Continue to Monitor. 10/17/2022   F/U in 2 months

## 2022-10-19 ENCOUNTER — Other Ambulatory Visit (HOSPITAL_BASED_OUTPATIENT_CLINIC_OR_DEPARTMENT_OTHER): Payer: Self-pay | Admitting: Internal Medicine

## 2022-10-21 DIAGNOSIS — M21962 Unspecified acquired deformity of left lower leg: Secondary | ICD-10-CM | POA: Diagnosis not present

## 2022-10-21 DIAGNOSIS — B353 Tinea pedis: Secondary | ICD-10-CM | POA: Diagnosis not present

## 2022-10-21 DIAGNOSIS — M792 Neuralgia and neuritis, unspecified: Secondary | ICD-10-CM | POA: Diagnosis not present

## 2022-10-21 DIAGNOSIS — I739 Peripheral vascular disease, unspecified: Secondary | ICD-10-CM | POA: Diagnosis not present

## 2022-10-21 DIAGNOSIS — M21961 Unspecified acquired deformity of right lower leg: Secondary | ICD-10-CM | POA: Diagnosis not present

## 2022-10-22 ENCOUNTER — Other Ambulatory Visit: Payer: Self-pay | Admitting: Registered Nurse

## 2022-10-22 DIAGNOSIS — K600 Acute anal fissure: Secondary | ICD-10-CM | POA: Diagnosis not present

## 2022-10-22 DIAGNOSIS — G4733 Obstructive sleep apnea (adult) (pediatric): Secondary | ICD-10-CM | POA: Diagnosis not present

## 2022-10-22 LAB — TOXASSURE SELECT,+ANTIDEPR,UR

## 2022-10-24 DIAGNOSIS — H02052 Trichiasis without entropian right lower eyelid: Secondary | ICD-10-CM | POA: Diagnosis not present

## 2022-11-07 ENCOUNTER — Telehealth: Payer: Self-pay | Admitting: *Deleted

## 2022-11-07 NOTE — Telephone Encounter (Signed)
Urine drug screen for this encounter is consistent for prescribed medication 

## 2022-11-11 DIAGNOSIS — H2512 Age-related nuclear cataract, left eye: Secondary | ICD-10-CM | POA: Diagnosis not present

## 2022-11-11 DIAGNOSIS — H02052 Trichiasis without entropian right lower eyelid: Secondary | ICD-10-CM | POA: Diagnosis not present

## 2022-11-11 DIAGNOSIS — H01003 Unspecified blepharitis right eye, unspecified eyelid: Secondary | ICD-10-CM | POA: Diagnosis not present

## 2022-11-13 NOTE — Progress Notes (Deleted)
Office Visit Note  Patient: Kara Bush             Date of Birth: 1940-12-03           MRN: [id number]             PCP: Jackelyn Poling, DO Referring: Jackelyn Poling, DO Visit Date: 11/26/2022 Occupation: @GUAROCC @  Subjective:  No chief complaint on file.   History of Present Illness: Kara Bush is a 82 y.o. female ***     Activities of Daily Living:  Patient reports morning stiffness for *** {minute/hour:19697}.   Patient {ACTIONS;DENIES/REPORTS:21021675::"Denies"} nocturnal pain.  Difficulty dressing/grooming: {ACTIONS;DENIES/REPORTS:21021675::"Denies"} Difficulty climbing stairs: {ACTIONS;DENIES/REPORTS:21021675::"Denies"} Difficulty getting out of chair: {ACTIONS;DENIES/REPORTS:21021675::"Denies"} Difficulty using hands for taps, buttons, cutlery, and/or writing: {ACTIONS;DENIES/REPORTS:21021675::"Denies"}  No Rheumatology ROS completed.   PMFS History:  Patient Active Problem List   Diagnosis Date Noted  . A-fib 02/26/2021  . Atrial fibrillation 09/09/2018  . DOE (dyspnea on exertion) 08/10/2017  . 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 Greater trochanteric bursitis of right hip 11/07/2014  . Sacro-iliac pain 11/07/2014  . Chronic midline low back pain without sciatica 11/07/2013  . Hepatitis 04/20/2013  . Screening for STD (sexually transmitted disease) 04/20/2013  . Recurrent UTI 04/20/2013  . Abdominal aortic aneurysm   . Ulcerative colitis   . Obstructive sleep apnea   . Fibromyalgia   . Coronary artery disease   . Basal cell carcinoma   . Anemia, improved 01/11/2013  . Ascending aortic aneurysm, 4.1 cm 01/11/2013  . 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 Chronic periscapular pain 10/15/2011  . Chest wall pain   . S/P resection of aortic aneurysm, 1998 prior to CABG   . PAD (peripheral artery disease), aortic-iliac bypass 1991, mild carotid bruits   . OSA (obstructive sleep apnea)   . Chronic fatigue fibromyalgia syndrome    . Hyperlipidemia   . Ulcerative colitis   . Depression   . CAD (coronary artery disease), CABG 1998, STENTS MULTIPLE SITES SINCE.    Marland Kitchen Hypertension     Past Medical History:  Diagnosis Date  . Abdominal aortic aneurysm (HCC)   . Anemia, improved 01/11/2013  . Basal cell carcinoma   . CAD (coronary artery disease)    hx CABG 1998, last cath 2007 with PCI and stenting to the proximal segment of the vein graft to the diagonal branch. DES Taxus was placed  . Cervicalgia   . Chest wall pain   . Chronic fatigue fibromyalgia syndrome   . Chronic pain syndrome   . Coronary artery disease   . Depression   . Fibromyalgia   . Fibromyalgia   . History of nuclear stress test 08/07/2011   lexiscan; no evidence of ischemia or infarct; low risk   . Hyperlipidemia   . Hypertension   . Hypertension   . Obstructive sleep apnea   . OSA (obstructive sleep apnea)   . PAD (peripheral artery disease) (HCC)   . Pes anserine bursitis   . Pulmonary embolism (HCC) 01/11/2013  . S/P resection of aortic aneurysm   . Trochanteric bursitis   . Ulcerative colitis (HCC)     Family History  Problem Relation Age of Onset  . Heart disease Father   . Heart attack Father        @ 21  . Cancer Paternal Aunt        BREAST  . Heart failure Mother        at 61  . Healthy Sister   .  Cancer Brother        pancreatic   . Heart failure Maternal Grandmother   . Heart attack Maternal Grandfather   . Heart attack Paternal Grandmother        at 52  . Healthy Daughter   . Healthy Daughter    Past Surgical History:  Procedure Laterality Date  . AORTOBIEXTERNAL ILIAC BYPASS  1991  . CATARACT EXTRACTION Left 09/2019  . CHOLECYSTECTOMY  1997  . CORONARY ANGIOPLASTY WITH STENT PLACEMENT  2006   to LAD, ATRETIC LIMA AT THAT TIME ALSO  . CORONARY ANGIOPLASTY WITH STENT PLACEMENT  2007   STENT TO PROX VG-DIAG, OTHER STENTS PATENT  . CORONARY ARTERY BYPASS GRAFT  1998   LIMA-LAD;VG-DIAG & RCA  . CORONARY STENT  PLACEMENT  2005   TO LAD & LCX-STENTS,  . HERNIA REPAIR    . TUBAL LIGATION     BTL   Social History   Social History Narrative  . Not on file   Immunization History  Administered Date(s) Administered  . Influenza,inj,Quad PF,6+ Mos 04/20/2013, 06/01/2014  . PFIZER(Purple Top)SARS-COV-2 Vaccination 08/18/2019, 09/11/2019, 04/27/2020     Objective: Vital Signs: There were no vitals taken for this visit.   Physical Exam   Musculoskeletal Exam: ***  CDAI Exam: CDAI Score: -- Patient Global: --; Provider Global: -- Swollen: --; Tender: -- Joint Exam 11/26/2022   No joint exam has been documented for this visit   There is currently no information documented on the homunculus. Go to the Rheumatology activity and complete the homunculus joint exam.  Investigation: No additional findings.  Imaging: No results found.  Recent Labs: Lab Results  Component Value Date   WBC 9.1 04/13/2022   HGB 12.0 04/13/2022   PLT 229 04/13/2022   NA 141 04/13/2022   K 4.0 04/13/2022   CL 110 04/13/2022   CO2 25 04/13/2022   GLUCOSE 134 (H) 04/13/2022   BUN 23 04/13/2022   CREATININE 1.30 (H) 04/13/2022   BILITOT 0.6 02/27/2021   ALKPHOS 37 (L) 02/27/2021   AST 17 02/27/2021   ALT 16 02/27/2021   PROT 6.1 (L) 02/27/2021   ALBUMIN 3.7 02/27/2021   CALCIUM 9.4 04/13/2022   GFRAA 73 10/13/2019    Speciality Comments: No specialty comments available.  Procedures:  No procedures performed Allergies: Biotin, Lisinopril, Penicillin g, Atenolol, Duloxetine hcl, Lyrica [pregabalin], Penicillins, and Sulfamethoxazole-trimethoprim   Assessment / Plan:     Visit Diagnoses: No diagnosis found.  Orders: No orders of the defined types were placed in this encounter.  No orders of the defined types were placed in this encounter.   Face-to-face time spent with patient was *** minutes. Greater than 50% of time was spent in counseling and coordination of care.  Follow-Up Instructions:  No follow-ups on file.   Ellen Henri, CMA  Note - This record has been created using Animal nutritionist.  Chart creation errors have been sought, but may not always  have been located. Such creation errors do not reflect on  the standard of medical care.

## 2022-11-21 ENCOUNTER — Telehealth: Payer: Self-pay

## 2022-11-21 DIAGNOSIS — I70213 Atherosclerosis of native arteries of extremities with intermittent claudication, bilateral legs: Secondary | ICD-10-CM

## 2022-11-21 NOTE — Telephone Encounter (Signed)
Caller: Patient  Concern: claudication and difficulty walking d/t pain, inclines/stairs are difficult, SOB, lifestyle limiting  Pt denies rest pain, swelling, or sores  Location: left leg, right leg  Description: gradual  Aggravating Factors: movement, walking, standing  Quality: cramping and tight (pulling)  Treatments: none  Resolution: Appointment scheduled from 1 yr recall with [place] & [place]  Next Appt: Appointment scheduled for 5/15

## 2022-11-22 ENCOUNTER — Other Ambulatory Visit: Payer: Self-pay

## 2022-11-22 ENCOUNTER — Inpatient Hospital Stay (HOSPITAL_COMMUNITY)
Admission: EM | Admit: 2022-11-22 | Discharge: 2022-11-28 | DRG: 377 | Disposition: A | Discharge status: Home Health | Payer: PPO | Attending: Family Medicine | Admitting: Family Medicine

## 2022-11-22 ENCOUNTER — Emergency Department (HOSPITAL_COMMUNITY): Payer: PPO

## 2022-11-22 DIAGNOSIS — K5731 Diverticulosis of large intestine without perforation or abscess with bleeding: Secondary | ICD-10-CM | POA: Diagnosis present

## 2022-11-22 DIAGNOSIS — Z955 Presence of coronary angioplasty implant and graft: Secondary | ICD-10-CM

## 2022-11-22 DIAGNOSIS — D62 Acute posthemorrhagic anemia: Secondary | ICD-10-CM | POA: Diagnosis present

## 2022-11-22 DIAGNOSIS — I739 Peripheral vascular disease, unspecified: Secondary | ICD-10-CM | POA: Diagnosis present

## 2022-11-22 DIAGNOSIS — Z882 Allergy status to sulfonamides status: Secondary | ICD-10-CM

## 2022-11-22 DIAGNOSIS — R079 Chest pain, unspecified: Secondary | ICD-10-CM

## 2022-11-22 DIAGNOSIS — I2510 Atherosclerotic heart disease of native coronary artery without angina pectoris: Secondary | ICD-10-CM | POA: Diagnosis present

## 2022-11-22 DIAGNOSIS — I2489 Other forms of acute ischemic heart disease: Secondary | ICD-10-CM | POA: Diagnosis not present

## 2022-11-22 DIAGNOSIS — D72829 Elevated white blood cell count, unspecified: Secondary | ICD-10-CM | POA: Diagnosis present

## 2022-11-22 DIAGNOSIS — Z85828 Personal history of other malignant neoplasm of skin: Secondary | ICD-10-CM | POA: Diagnosis not present

## 2022-11-22 DIAGNOSIS — M797 Fibromyalgia: Secondary | ICD-10-CM | POA: Diagnosis present

## 2022-11-22 DIAGNOSIS — K5190 Ulcerative colitis, unspecified, without complications: Secondary | ICD-10-CM | POA: Diagnosis present

## 2022-11-22 DIAGNOSIS — N179 Acute kidney failure, unspecified: Secondary | ICD-10-CM | POA: Diagnosis present

## 2022-11-22 DIAGNOSIS — Z6834 Body mass index (BMI) 34.0-34.9, adult: Secondary | ICD-10-CM | POA: Diagnosis not present

## 2022-11-22 DIAGNOSIS — Z66 Do not resuscitate: Secondary | ICD-10-CM | POA: Diagnosis present

## 2022-11-22 DIAGNOSIS — I493 Ventricular premature depolarization: Secondary | ICD-10-CM | POA: Diagnosis present

## 2022-11-22 DIAGNOSIS — G894 Chronic pain syndrome: Secondary | ICD-10-CM | POA: Diagnosis present

## 2022-11-22 DIAGNOSIS — I4891 Unspecified atrial fibrillation: Secondary | ICD-10-CM | POA: Diagnosis present

## 2022-11-22 DIAGNOSIS — Z7901 Long term (current) use of anticoagulants: Secondary | ICD-10-CM

## 2022-11-22 DIAGNOSIS — R7989 Other specified abnormal findings of blood chemistry: Secondary | ICD-10-CM | POA: Diagnosis not present

## 2022-11-22 DIAGNOSIS — N1832 Chronic kidney disease, stage 3b: Secondary | ICD-10-CM | POA: Diagnosis present

## 2022-11-22 DIAGNOSIS — I214 Non-ST elevation (NSTEMI) myocardial infarction: Secondary | ICD-10-CM | POA: Diagnosis present

## 2022-11-22 DIAGNOSIS — J9601 Acute respiratory failure with hypoxia: Secondary | ICD-10-CM | POA: Diagnosis not present

## 2022-11-22 DIAGNOSIS — Z86711 Personal history of pulmonary embolism: Secondary | ICD-10-CM

## 2022-11-22 DIAGNOSIS — E669 Obesity, unspecified: Secondary | ICD-10-CM | POA: Diagnosis present

## 2022-11-22 DIAGNOSIS — E785 Hyperlipidemia, unspecified: Secondary | ICD-10-CM | POA: Diagnosis present

## 2022-11-22 DIAGNOSIS — E876 Hypokalemia: Secondary | ICD-10-CM | POA: Diagnosis present

## 2022-11-22 DIAGNOSIS — G4733 Obstructive sleep apnea (adult) (pediatric): Secondary | ICD-10-CM | POA: Diagnosis not present

## 2022-11-22 DIAGNOSIS — K5521 Angiodysplasia of colon with hemorrhage: Principal | ICD-10-CM | POA: Diagnosis present

## 2022-11-22 DIAGNOSIS — K5730 Diverticulosis of large intestine without perforation or abscess without bleeding: Secondary | ICD-10-CM | POA: Diagnosis not present

## 2022-11-22 DIAGNOSIS — Z9049 Acquired absence of other specified parts of digestive tract: Secondary | ICD-10-CM

## 2022-11-22 DIAGNOSIS — I480 Paroxysmal atrial fibrillation: Secondary | ICD-10-CM | POA: Diagnosis present

## 2022-11-22 DIAGNOSIS — I5042 Chronic combined systolic (congestive) and diastolic (congestive) heart failure: Secondary | ICD-10-CM | POA: Diagnosis present

## 2022-11-22 DIAGNOSIS — F32A Depression, unspecified: Secondary | ICD-10-CM | POA: Diagnosis present

## 2022-11-22 DIAGNOSIS — Z87891 Personal history of nicotine dependence: Secondary | ICD-10-CM | POA: Diagnosis not present

## 2022-11-22 DIAGNOSIS — I21A1 Myocardial infarction type 2: Secondary | ICD-10-CM | POA: Diagnosis present

## 2022-11-22 DIAGNOSIS — E7800 Pure hypercholesterolemia, unspecified: Secondary | ICD-10-CM | POA: Diagnosis present

## 2022-11-22 DIAGNOSIS — Z951 Presence of aortocoronary bypass graft: Secondary | ICD-10-CM

## 2022-11-22 DIAGNOSIS — K922 Gastrointestinal hemorrhage, unspecified: Secondary | ICD-10-CM | POA: Diagnosis not present

## 2022-11-22 DIAGNOSIS — D696 Thrombocytopenia, unspecified: Secondary | ICD-10-CM | POA: Diagnosis present

## 2022-11-22 DIAGNOSIS — K625 Hemorrhage of anus and rectum: Secondary | ICD-10-CM | POA: Diagnosis not present

## 2022-11-22 DIAGNOSIS — K51011 Ulcerative (chronic) pancolitis with rectal bleeding: Secondary | ICD-10-CM | POA: Diagnosis not present

## 2022-11-22 DIAGNOSIS — N183 Chronic kidney disease, stage 3 unspecified: Secondary | ICD-10-CM | POA: Diagnosis present

## 2022-11-22 DIAGNOSIS — I252 Old myocardial infarction: Secondary | ICD-10-CM | POA: Diagnosis not present

## 2022-11-22 DIAGNOSIS — I959 Hypotension, unspecified: Secondary | ICD-10-CM | POA: Diagnosis not present

## 2022-11-22 DIAGNOSIS — Z8249 Family history of ischemic heart disease and other diseases of the circulatory system: Secondary | ICD-10-CM

## 2022-11-22 DIAGNOSIS — Q231 Congenital insufficiency of aortic valve: Secondary | ICD-10-CM

## 2022-11-22 DIAGNOSIS — R0789 Other chest pain: Secondary | ICD-10-CM | POA: Diagnosis not present

## 2022-11-22 DIAGNOSIS — I130 Hypertensive heart and chronic kidney disease with heart failure and stage 1 through stage 4 chronic kidney disease, or unspecified chronic kidney disease: Secondary | ICD-10-CM | POA: Diagnosis present

## 2022-11-22 DIAGNOSIS — I071 Rheumatic tricuspid insufficiency: Secondary | ICD-10-CM | POA: Diagnosis present

## 2022-11-22 DIAGNOSIS — I77810 Thoracic aortic ectasia: Secondary | ICD-10-CM | POA: Diagnosis present

## 2022-11-22 DIAGNOSIS — Z79899 Other long term (current) drug therapy: Secondary | ICD-10-CM

## 2022-11-22 DIAGNOSIS — I10 Essential (primary) hypertension: Secondary | ICD-10-CM | POA: Diagnosis present

## 2022-11-22 DIAGNOSIS — K449 Diaphragmatic hernia without obstruction or gangrene: Secondary | ICD-10-CM | POA: Diagnosis present

## 2022-11-22 DIAGNOSIS — Z8679 Personal history of other diseases of the circulatory system: Secondary | ICD-10-CM

## 2022-11-22 DIAGNOSIS — D509 Iron deficiency anemia, unspecified: Secondary | ICD-10-CM | POA: Diagnosis not present

## 2022-11-22 DIAGNOSIS — R42 Dizziness and giddiness: Secondary | ICD-10-CM | POA: Diagnosis not present

## 2022-11-22 DIAGNOSIS — G9332 Myalgic encephalomyelitis/chronic fatigue syndrome: Secondary | ICD-10-CM | POA: Diagnosis present

## 2022-11-22 DIAGNOSIS — Z888 Allergy status to other drugs, medicaments and biological substances status: Secondary | ICD-10-CM

## 2022-11-22 DIAGNOSIS — R000 Tachycardia, unspecified: Secondary | ICD-10-CM | POA: Diagnosis not present

## 2022-11-22 DIAGNOSIS — Z9842 Cataract extraction status, left eye: Secondary | ICD-10-CM

## 2022-11-22 DIAGNOSIS — Z880 Allergy status to penicillin: Secondary | ICD-10-CM

## 2022-11-22 DIAGNOSIS — D649 Anemia, unspecified: Principal | ICD-10-CM | POA: Diagnosis present

## 2022-11-22 DIAGNOSIS — K5520 Angiodysplasia of colon without hemorrhage: Secondary | ICD-10-CM | POA: Diagnosis not present

## 2022-11-22 LAB — BASIC METABOLIC PANEL
Anion gap: 11 (ref 5–15)
BUN: 23 mg/dL (ref 8–23)
CO2: 22 mmol/L (ref 22–32)
Calcium: 9.1 mg/dL (ref 8.9–10.3)
Chloride: 108 mmol/L (ref 98–111)
Creatinine, Ser: 1.37 mg/dL — ABNORMAL HIGH (ref 0.44–1.00)
GFR, Estimated: 38 mL/min — ABNORMAL LOW (ref >60)
Glucose, Bld: 132 mg/dL — ABNORMAL HIGH (ref 70–99)
Potassium: 4.3 mmol/L (ref 3.5–5.1)
Sodium: 141 mmol/L (ref 135–145)

## 2022-11-22 LAB — CBC
HCT: 21.7 % — ABNORMAL LOW (ref 36.0–46.0)
Hemoglobin: 6.2 g/dL — CL (ref 12.0–15.0)
MCH: 18.5 pg — ABNORMAL LOW (ref 26.0–34.0)
MCHC: 28.6 g/dL — ABNORMAL LOW (ref 30.0–36.0)
MCV: 64.8 fL — ABNORMAL LOW (ref 80.0–100.0)
Platelets: 220 10*3/uL (ref 150–400)
RBC: 3.35 MIL/uL — ABNORMAL LOW (ref 3.87–5.11)
RDW: 16.5 % — ABNORMAL HIGH (ref 11.5–15.5)
WBC: 7.9 10*3/uL (ref 4.0–10.5)
nRBC: 0 % (ref 0.0–0.2)

## 2022-11-22 LAB — TROPONIN I (HIGH SENSITIVITY): Troponin I (High Sensitivity): 37 ng/L — ABNORMAL HIGH (ref <18)

## 2022-11-22 NOTE — ED Triage Notes (Addendum)
Patient arrived with EMS from home reports central chest pain with SOB this evening , no emesis or diaphoresis , she received 1 NTG sl and ASA 324 mg by EMS, history of CAD,Coronary Stents/CABG . Her cardiologist is Dr. Rennis Golden.

## 2022-11-23 ENCOUNTER — Inpatient Hospital Stay (HOSPITAL_COMMUNITY): Payer: PPO

## 2022-11-23 ENCOUNTER — Encounter (HOSPITAL_COMMUNITY): Payer: Self-pay | Admitting: Internal Medicine

## 2022-11-23 DIAGNOSIS — I5042 Chronic combined systolic (congestive) and diastolic (congestive) heart failure: Secondary | ICD-10-CM | POA: Diagnosis present

## 2022-11-23 DIAGNOSIS — K5190 Ulcerative colitis, unspecified, without complications: Secondary | ICD-10-CM | POA: Diagnosis present

## 2022-11-23 DIAGNOSIS — E7800 Pure hypercholesterolemia, unspecified: Secondary | ICD-10-CM | POA: Diagnosis present

## 2022-11-23 DIAGNOSIS — R079 Chest pain, unspecified: Secondary | ICD-10-CM

## 2022-11-23 DIAGNOSIS — K5731 Diverticulosis of large intestine without perforation or abscess with bleeding: Secondary | ICD-10-CM | POA: Diagnosis present

## 2022-11-23 DIAGNOSIS — R7989 Other specified abnormal findings of blood chemistry: Secondary | ICD-10-CM

## 2022-11-23 DIAGNOSIS — D509 Iron deficiency anemia, unspecified: Secondary | ICD-10-CM | POA: Diagnosis not present

## 2022-11-23 DIAGNOSIS — E876 Hypokalemia: Secondary | ICD-10-CM | POA: Diagnosis present

## 2022-11-23 DIAGNOSIS — J9601 Acute respiratory failure with hypoxia: Secondary | ICD-10-CM | POA: Diagnosis not present

## 2022-11-23 DIAGNOSIS — F32A Depression, unspecified: Secondary | ICD-10-CM | POA: Diagnosis present

## 2022-11-23 DIAGNOSIS — N179 Acute kidney failure, unspecified: Secondary | ICD-10-CM | POA: Diagnosis present

## 2022-11-23 DIAGNOSIS — K5521 Angiodysplasia of colon with hemorrhage: Secondary | ICD-10-CM | POA: Diagnosis present

## 2022-11-23 DIAGNOSIS — Z85828 Personal history of other malignant neoplasm of skin: Secondary | ICD-10-CM | POA: Diagnosis not present

## 2022-11-23 DIAGNOSIS — N1832 Chronic kidney disease, stage 3b: Secondary | ICD-10-CM | POA: Diagnosis present

## 2022-11-23 DIAGNOSIS — M797 Fibromyalgia: Secondary | ICD-10-CM | POA: Diagnosis present

## 2022-11-23 DIAGNOSIS — Q231 Congenital insufficiency of aortic valve: Secondary | ICD-10-CM | POA: Diagnosis not present

## 2022-11-23 DIAGNOSIS — K922 Gastrointestinal hemorrhage, unspecified: Secondary | ICD-10-CM | POA: Diagnosis not present

## 2022-11-23 DIAGNOSIS — I130 Hypertensive heart and chronic kidney disease with heart failure and stage 1 through stage 4 chronic kidney disease, or unspecified chronic kidney disease: Secondary | ICD-10-CM | POA: Diagnosis present

## 2022-11-23 DIAGNOSIS — K5730 Diverticulosis of large intestine without perforation or abscess without bleeding: Secondary | ICD-10-CM | POA: Diagnosis not present

## 2022-11-23 DIAGNOSIS — Z66 Do not resuscitate: Secondary | ICD-10-CM | POA: Diagnosis present

## 2022-11-23 DIAGNOSIS — G894 Chronic pain syndrome: Secondary | ICD-10-CM | POA: Diagnosis present

## 2022-11-23 DIAGNOSIS — Z6834 Body mass index (BMI) 34.0-34.9, adult: Secondary | ICD-10-CM | POA: Diagnosis not present

## 2022-11-23 DIAGNOSIS — N183 Chronic kidney disease, stage 3 unspecified: Secondary | ICD-10-CM | POA: Diagnosis present

## 2022-11-23 DIAGNOSIS — D649 Anemia, unspecified: Secondary | ICD-10-CM | POA: Diagnosis not present

## 2022-11-23 DIAGNOSIS — I739 Peripheral vascular disease, unspecified: Secondary | ICD-10-CM | POA: Diagnosis present

## 2022-11-23 DIAGNOSIS — I480 Paroxysmal atrial fibrillation: Secondary | ICD-10-CM | POA: Diagnosis present

## 2022-11-23 DIAGNOSIS — I214 Non-ST elevation (NSTEMI) myocardial infarction: Secondary | ICD-10-CM | POA: Diagnosis not present

## 2022-11-23 DIAGNOSIS — G4733 Obstructive sleep apnea (adult) (pediatric): Secondary | ICD-10-CM | POA: Diagnosis not present

## 2022-11-23 DIAGNOSIS — K449 Diaphragmatic hernia without obstruction or gangrene: Secondary | ICD-10-CM | POA: Diagnosis not present

## 2022-11-23 DIAGNOSIS — D62 Acute posthemorrhagic anemia: Secondary | ICD-10-CM | POA: Diagnosis present

## 2022-11-23 DIAGNOSIS — I21A1 Myocardial infarction type 2: Secondary | ICD-10-CM | POA: Diagnosis present

## 2022-11-23 DIAGNOSIS — I2489 Other forms of acute ischemic heart disease: Secondary | ICD-10-CM | POA: Diagnosis present

## 2022-11-23 DIAGNOSIS — D696 Thrombocytopenia, unspecified: Secondary | ICD-10-CM | POA: Diagnosis present

## 2022-11-23 LAB — HEPATIC FUNCTION PANEL
ALT: 11 U/L (ref 0–44)
AST: 20 U/L (ref 15–41)
Albumin: 3.4 g/dL — ABNORMAL LOW (ref 3.5–5.0)
Alkaline Phosphatase: 32 U/L — ABNORMAL LOW (ref 38–126)
Bilirubin, Direct: <0.1 mg/dL (ref 0.0–0.2)
Total Bilirubin: <0.1 mg/dL — ABNORMAL LOW (ref 0.3–1.2)
Total Protein: 6 g/dL — ABNORMAL LOW (ref 6.5–8.1)

## 2022-11-23 LAB — COMPREHENSIVE METABOLIC PANEL
ALT: 13 U/L (ref 0–44)
AST: 21 U/L (ref 15–41)
Albumin: 3.3 g/dL — ABNORMAL LOW (ref 3.5–5.0)
Alkaline Phosphatase: 29 U/L — ABNORMAL LOW (ref 38–126)
Anion gap: 6 (ref 5–15)
BUN: 19 mg/dL (ref 8–23)
CO2: 23 mmol/L (ref 22–32)
Calcium: 8.6 mg/dL — ABNORMAL LOW (ref 8.9–10.3)
Chloride: 109 mmol/L (ref 98–111)
Creatinine, Ser: 1.22 mg/dL — ABNORMAL HIGH (ref 0.44–1.00)
GFR, Estimated: 44 mL/min — ABNORMAL LOW (ref >60)
Glucose, Bld: 114 mg/dL — ABNORMAL HIGH (ref 70–99)
Potassium: 4.4 mmol/L (ref 3.5–5.1)
Sodium: 138 mmol/L (ref 135–145)
Total Bilirubin: 1.1 mg/dL (ref 0.3–1.2)
Total Protein: 5.7 g/dL — ABNORMAL LOW (ref 6.5–8.1)

## 2022-11-23 LAB — CBC
HCT: 26.3 % — ABNORMAL LOW (ref 36.0–46.0)
Hemoglobin: 7.9 g/dL — ABNORMAL LOW (ref 12.0–15.0)
MCH: 20.1 pg — ABNORMAL LOW (ref 26.0–34.0)
MCHC: 30 g/dL (ref 30.0–36.0)
MCV: 66.8 fL — ABNORMAL LOW (ref 80.0–100.0)
Platelets: 196 10*3/uL (ref 150–400)
RBC: 3.94 MIL/uL (ref 3.87–5.11)
RDW: 18.4 % — ABNORMAL HIGH (ref 11.5–15.5)
WBC: 9.8 10*3/uL (ref 4.0–10.5)
nRBC: 0.2 % (ref 0.0–0.2)

## 2022-11-23 LAB — CBC WITH DIFFERENTIAL/PLATELET
Abs Immature Granulocytes: 0.04 10*3/uL (ref 0.00–0.07)
Basophils Absolute: 0.1 10*3/uL (ref 0.0–0.1)
Basophils Relative: 1 %
Eosinophils Absolute: 0.1 10*3/uL (ref 0.0–0.5)
Eosinophils Relative: 1 %
HCT: 26.4 % — ABNORMAL LOW (ref 36.0–46.0)
Hemoglobin: 7.9 g/dL — ABNORMAL LOW (ref 12.0–15.0)
Immature Granulocytes: 0 %
Lymphocytes Relative: 17 %
Lymphs Abs: 1.7 10*3/uL (ref 0.7–4.0)
MCH: 20.6 pg — ABNORMAL LOW (ref 26.0–34.0)
MCHC: 29.9 g/dL — ABNORMAL LOW (ref 30.0–36.0)
MCV: 68.8 fL — ABNORMAL LOW (ref 80.0–100.0)
Monocytes Absolute: 1.1 10*3/uL — ABNORMAL HIGH (ref 0.1–1.0)
Monocytes Relative: 11 %
Neutro Abs: 7.1 10*3/uL (ref 1.7–7.7)
Neutrophils Relative %: 70 %
Platelets: 198 10*3/uL (ref 150–400)
RBC: 3.84 MIL/uL — ABNORMAL LOW (ref 3.87–5.11)
RDW: 18.7 % — ABNORMAL HIGH (ref 11.5–15.5)
WBC: 10 10*3/uL (ref 4.0–10.5)
nRBC: 0.2 % (ref 0.0–0.2)

## 2022-11-23 LAB — ECHOCARDIOGRAM COMPLETE
AR max vel: 0.95 cm2
AV Area VTI: 0.88 cm2
AV Area mean vel: 0.9 cm2
AV Mean grad: 15 mmHg
AV Peak grad: 26.4 mmHg
Ao pk vel: 2.57 m/s
Area-P 1/2: 5.13 cm2
Est EF: 60
Height: 64 in
MV M vel: 4.63 m/s
MV Peak grad: 85.7 mmHg
S' Lateral: 2.9 cm
Weight: 3174.62 oz

## 2022-11-23 LAB — TROPONIN I (HIGH SENSITIVITY)
Troponin I (High Sensitivity): 128 ng/L (ref <18)
Troponin I (High Sensitivity): 492 ng/L (ref <18)

## 2022-11-23 LAB — PREPARE RBC (CROSSMATCH)

## 2022-11-23 LAB — BPAM RBC
Blood Product Expiration Date: 202404252359
ISSUE DATE / TIME: 202404210104
ISSUE DATE / TIME: 202404210309

## 2022-11-23 LAB — TYPE AND SCREEN
ABO/RH(D): B POS
Antibody Screen: NEGATIVE

## 2022-11-23 LAB — LIPASE, BLOOD: Lipase: 37 U/L (ref 11–51)

## 2022-11-23 LAB — ABO/RH: ABO/RH(D): B POS

## 2022-11-23 LAB — MAGNESIUM: Magnesium: 1.6 mg/dL — ABNORMAL LOW (ref 1.7–2.4)

## 2022-11-23 MED ORDER — ACETAMINOPHEN 650 MG RE SUPP: 650.0000 mg | Freq: Four times a day (QID) | RECTAL | Status: DC | PRN

## 2022-11-23 MED ORDER — PANTOPRAZOLE INFUSION (NEW) - SIMPLE MED
8.0000 mg/h | INTRAVENOUS | Status: AC
  Administered 2022-11-23 – 2022-11-26 (×6): 8 mg/h via INTRAVENOUS
  Filled 2022-11-23 (×7): qty 100

## 2022-11-23 MED ORDER — AMLODIPINE BESYLATE 5 MG PO TABS
5.0000 mg | ORAL_TABLET | Freq: Every day | ORAL | Status: DC
  Administered 2022-11-23 – 2022-11-28 (×6): 5 mg via ORAL
  Filled 2022-11-23 (×6): qty 1

## 2022-11-23 MED ORDER — ATORVASTATIN CALCIUM 10 MG PO TABS
20.0000 mg | ORAL_TABLET | Freq: Every day | ORAL | Status: DC
  Administered 2022-11-24 – 2022-11-28 (×5): 20 mg via ORAL
  Filled 2022-11-23 (×5): qty 2

## 2022-11-23 MED ORDER — LACTATED RINGERS IV SOLN: INTRAVENOUS | Status: DC

## 2022-11-23 MED ORDER — PANTOPRAZOLE SODIUM 40 MG IV SOLR
40.0000 mg | Freq: Two times a day (BID) | INTRAVENOUS | Status: DC
  Administered 2022-11-26 – 2022-11-28 (×4): 40 mg via INTRAVENOUS
  Filled 2022-11-23 (×4): qty 10

## 2022-11-23 MED ORDER — MELATONIN 3 MG PO TABS: 3.0000 mg | ORAL_TABLET | Freq: Every evening | ORAL | Status: DC | PRN

## 2022-11-23 MED ORDER — ATORVASTATIN CALCIUM 10 MG PO TABS: 20.0000 mg | ORAL_TABLET | Freq: Every day | ORAL | Status: DC

## 2022-11-23 MED ORDER — ZOLPIDEM TARTRATE 5 MG PO TABS
5.0000 mg | ORAL_TABLET | Freq: Every evening | ORAL | Status: DC | PRN
  Administered 2022-11-23 – 2022-11-27 (×5): 5 mg via ORAL
  Filled 2022-11-23 (×5): qty 1

## 2022-11-23 MED ORDER — HYDRALAZINE HCL 20 MG/ML IJ SOLN: 5.0000 mg | INTRAMUSCULAR | Status: DC | PRN

## 2022-11-23 MED ORDER — PANTOPRAZOLE 80MG IVPB - SIMPLE MED
80.0000 mg | Freq: Once | INTRAVENOUS | Status: AC
  Administered 2022-11-23: 80 mg via INTRAVENOUS
  Filled 2022-11-23: qty 100

## 2022-11-23 MED ORDER — MORPHINE SULFATE (PF) 2 MG/ML IV SOLN
2.0000 mg | INTRAVENOUS | Status: DC | PRN
  Administered 2022-11-23 – 2022-11-24 (×2): 2 mg via INTRAVENOUS
  Filled 2022-11-23 (×2): qty 1

## 2022-11-23 MED ORDER — ACETAMINOPHEN 325 MG PO TABS
650.0000 mg | ORAL_TABLET | Freq: Four times a day (QID) | ORAL | Status: DC | PRN
  Administered 2022-11-24 – 2022-11-28 (×2): 650 mg via ORAL
  Filled 2022-11-23 (×2): qty 2

## 2022-11-23 MED ORDER — SODIUM CHLORIDE 0.9% FLUSH
3.0000 mL | Freq: Two times a day (BID) | INTRAVENOUS | Status: DC
  Administered 2022-11-23 – 2022-11-28 (×8): 3 mL via INTRAVENOUS

## 2022-11-23 MED ORDER — SIMVASTATIN 20 MG PO TABS
40.0000 mg | ORAL_TABLET | Freq: Every day | ORAL | Status: DC
  Administered 2022-11-23: 40 mg via ORAL
  Filled 2022-11-23: qty 2

## 2022-11-23 MED ORDER — HYDROCODONE-ACETAMINOPHEN 5-325 MG PO TABS
1.0000 | ORAL_TABLET | Freq: Once | ORAL | Status: AC
  Administered 2022-11-23: 1 via ORAL
  Filled 2022-11-23: qty 1

## 2022-11-23 MED ORDER — METHOCARBAMOL 500 MG PO TABS: 500.0000 mg | ORAL_TABLET | Freq: Three times a day (TID) | ORAL | Status: DC | PRN

## 2022-11-23 MED ORDER — SODIUM CHLORIDE 0.9% IV SOLUTION: Freq: Once | INTRAVENOUS | Status: DC

## 2022-11-23 MED ORDER — ONDANSETRON HCL 4 MG/2ML IJ SOLN
4.0000 mg | Freq: Four times a day (QID) | INTRAMUSCULAR | Status: DC | PRN
  Administered 2022-11-23 – 2022-11-24 (×4): 4 mg via INTRAVENOUS
  Filled 2022-11-23 (×4): qty 2

## 2022-11-23 MED ORDER — METOPROLOL SUCCINATE ER 50 MG PO TB24
50.0000 mg | ORAL_TABLET | Freq: Every day | ORAL | Status: DC
  Administered 2022-11-23 – 2022-11-28 (×6): 50 mg via ORAL
  Filled 2022-11-23 (×2): qty 1
  Filled 2022-11-23: qty 2
  Filled 2022-11-23 (×3): qty 1

## 2022-11-23 MED ORDER — CITALOPRAM HYDROBROMIDE 20 MG PO TABS
20.0000 mg | ORAL_TABLET | Freq: Every day | ORAL | Status: DC
  Administered 2022-11-23 – 2022-11-28 (×6): 20 mg via ORAL
  Filled 2022-11-23: qty 1
  Filled 2022-11-23: qty 2
  Filled 2022-11-23 (×4): qty 1

## 2022-11-23 MED ORDER — HYDROCODONE-ACETAMINOPHEN 5-325 MG PO TABS
1.0000 | ORAL_TABLET | Freq: Four times a day (QID) | ORAL | Status: DC | PRN
  Administered 2022-11-23: 1.5 via ORAL
  Administered 2022-11-23: 1 via ORAL
  Administered 2022-11-23 – 2022-11-24 (×4): 1.5 via ORAL
  Filled 2022-11-23: qty 2
  Filled 2022-11-23: qty 1
  Filled 2022-11-23: qty 2
  Filled 2022-11-23 (×2): qty 1
  Filled 2022-11-23 (×2): qty 2

## 2022-11-23 NOTE — H&P (Addendum)
History and Physical    Patient: Kara Bush:[id number] DOB: 1940/11/08 DOA: 11/22/2022 DOS: the patient was seen and examined on 11/23/2022 PCP: Jackelyn Poling, DO  Patient coming from: Home - lives with grandson; NOK: Daughter, [phone]   Chief Complaint: CP/SOB  HPI: Kara Bush is a 82 y.o. female with medical history significant of AAA, CAD s/p CABG, chronic fatigue/chronic pain/fibromyalgia, HTN, HLD, OSA, and UC presenting with CP/SOB.   She had been feeling bad and then started having bad chest pain/pressure.  It was not stopping so she decided to call 911.  Substernal CP, started early evening last night.  She was watching TV when it started because she hadn't felt good.  She has had a couple of watery bloody BMs in the ER, felt somewhat constipated recently.  The chest pain has resolved, although she still feels "uncomfortable, like I'm sick."  CP resolved with NTG.  She has had CABG, 3 stents since, AAA graft with some stenosis above the graft (inoperable), L RAS.  Dr. Rennis Golden is cardiology, Dr. Randie Heinz is vascular.  Some nausea recently, no vomiting.  No abdominal pain.    ER Course:  Carryover, per Dr. Arlean Hopping:  Chest pain, acute vs subacute anemia, and elevated troponin after presenting with intermittent chest discomfort over the last few days, associated with mild shortness of breath.  Currently without any chest pain, after most recent episode resolved spontaneously, in the absence of any sublingual nitroglycerin.  She denies any melena or hematochezia, no hematemesis.  It appears that she is on Eliquis as an outpatient.  Vital signs appear stable, in the absence of any tachycardia, with normotensive blood pressures.  CBC reveals hemoglobin of 6.2, down from 12.0 in September 2023.  High-sensitivity troponin I initially 37, with repeat value up to 128.  EKG shows no evidence of acute ischemic changes.  DRE with fecal occult blood test is pending. Suspected type II  supply/demand mismatch as a consequence of acute versus subacute anemia.  Orders have been placed for transfusion of 2 units PRBC.  Cardiology consult is pending.       Review of Systems: As mentioned in the history of present illness. All other systems reviewed and are negative. Past Medical History:  Diagnosis Date   Abdominal aortic aneurysm    Anemia, improved 01/11/2013   Basal cell carcinoma    CAD (coronary artery disease)    hx CABG 1998, last cath 2007 with PCI and stenting to the proximal segment of the vein graft to the diagonal branch. DES Taxus was placed   Cervicalgia    Chronic fatigue fibromyalgia syndrome    Chronic pain syndrome    Depression    Fibromyalgia    History of nuclear stress test 08/07/2011   lexiscan; no evidence of ischemia or infarct; low risk    Hyperlipidemia    Hypertension    OSA (obstructive sleep apnea)    PAD (peripheral artery disease)    Pes anserine bursitis    Pulmonary embolism 01/11/2013   S/P resection of aortic aneurysm    Trochanteric bursitis    Ulcerative colitis    Past Surgical History:  Procedure Laterality Date   AORTOBIEXTERNAL ILIAC BYPASS  1991   CATARACT EXTRACTION Left 09/2019   CHOLECYSTECTOMY  1997   CORONARY ANGIOPLASTY WITH STENT PLACEMENT  2006   to LAD, ATRETIC LIMA AT THAT TIME ALSO   CORONARY ANGIOPLASTY WITH STENT PLACEMENT  2007   STENT TO PROX VG-DIAG, OTHER STENTS PATENT  CORONARY ARTERY BYPASS GRAFT  1998   LIMA-LAD;VG-DIAG & RCA   CORONARY STENT PLACEMENT  2005   TO LAD & LCX-STENTS,   HERNIA REPAIR     TUBAL LIGATION     BTL   Social History:  reports that she quit smoking about 32 years ago. Her smoking use included cigarettes. She has a 50.00 pack-year smoking history. She has never been exposed to tobacco smoke. She has never used smokeless tobacco. She reports that she does not drink alcohol and does not use drugs.  Allergies  Allergen Reactions   Biotin Hives   Lisinopril Cough    Penicillin G     Other reaction(s): rash   Atenolol Other (See Comments)    colitis Other reaction(s): Bowel issues   Duloxetine Hcl Other (See Comments)    Mad pt feel spacey Other reaction(s): shaky; tired in the PMs   Lyrica [Pregabalin] Other (See Comments)    Made pt feel spacey   Penicillins Rash   Sulfamethoxazole-Trimethoprim Nausea And Vomiting    Other reaction(s): elevated liver functions    Family History  Problem Relation Age of Onset   Heart disease Father    Heart attack Father        @ 8   Cancer Paternal Aunt        BREAST   Heart failure Mother        at 75   Healthy Sister    Cancer Brother        pancreatic    Heart failure Maternal Grandmother    Heart attack Maternal Grandfather    Heart attack Paternal Grandmother        at 93   Healthy Daughter    Healthy Daughter     Prior to Admission medications   Medication Sig Start Date End Date Taking? Authorizing Provider  amLODipine (NORVASC) 5 MG tablet TAKE ONE TABLET BY MOUTH ONCE daily 07/21/22   Hilty, Lisette Abu, MD  b complex vitamins capsule Take 1 capsule by mouth daily.    [provider]  betamethasone dipropionate 0.05 % cream 1 application    [provider]  citalopram (CELEXA) 20 MG tablet Take 1 tablet by mouth daily.    [provider]  clobetasol ointment (TEMOVATE) 0.05 % APPLY THIN LAYER TO AFFECTED AREAS EXTERNALLY TWO TO THREE TIMES WEEKLY AS NEEDED 90 DAYS    [provider]  ELIQUIS 5 MG TABS tablet TAKE ONE TABLET BY MOUTH twice daily 07/21/22   Hilty, Lisette Abu, MD  ENTRESTO 97-103 MG TAKE ONE TABLET BY MOUTH TWICE DAILY 10/23/22   Hilty, Lisette Abu, MD  ezetimibe (ZETIA) 10 MG tablet TAKE ONE TABLET BY MOUTH ONCE daily 07/21/22   Hilty, Lisette Abu, MD  furosemide (LASIX) 40 MG tablet Take 1 tablet (40 mg total) by mouth 3 (three) times a week. Increase to every day as needed for weight gain of 2lbs overnight or 5lbs in 1 week 09/26/22   Chrystie Nose, MD  HYDROcodone-acetaminophen (NORCO/VICODIN) 5-325 MG tablet Take 1 tablet by mouth 4 (four) times daily as needed for moderate pain. May Take an extra 0.5 to one tablet when pain is severe. 10/17/22   Jones Bales, NP  methocarbamol (ROBAXIN) 500 MG tablet TAKE 1 TABLET BY MOUTH EVERY 8 HOURS AS NEEDED FOR MUSCLE SPASM 10/23/22   Jones Bales, NP  metoprolol succinate (TOPROL-XL) 50 MG 24 hr tablet TAKE ONE TABLET BY MOUTH ONCE daily 10/23/22  Chrystie Nose, MD  Multiple Vitamin (MULITIVITAMIN WITH MINERALS) TABS Take 1 tablet by mouth daily.    [provider]  ondansetron (ZOFRAN) 4 MG tablet Take 1 tablet by mouth every 6 (six) hours as needed.    [provider]  potassium chloride (KLOR-CON M) 10 MEQ tablet Take 1 tablet (10 mEq total) by mouth 3 (three) times a week. And with extra lasix as needed. 09/26/22   Chrystie Nose, MD  simvastatin (ZOCOR) 40 MG tablet TAKE ONE TABLET BY MOUTH ONCE daily 07/21/22   Hilty, Lisette Abu, MD  zolpidem (AMBIEN CR) 12.5 MG CR tablet Take 12.5 mg by mouth at bedtime as needed. 09/11/21   [provider]    Physical Exam: Vitals:   11/23/22 1130 11/23/22 1230 11/23/22 1256 11/23/22 1333  BP: (!) 131/50 (!) 130/57  (!) 146/56  Pulse: 85 84  83  Resp: 17 (!) 21  19  Temp:   98.1 F (36.7 C) 97.9 F (36.6 C)  TempSrc:   Oral Oral  SpO2: 99% 98%  98%  Weight:      Height:    5\' 4"  (1.626 m)   General:  Appears calm and comfortable and is in NAD, very pale Eyes:  PERRL, EOMI, normal lids, iris ENT:  grossly normal hearing, lips & tongue, mmm Neck:  no LAD, masses or thyromegaly Cardiovascular:  RRR, no m/r/g. No LE edema.  Respiratory:   CTA bilaterally with no wheezes/rales/rhonchi.  Normal respiratory effort. Abdomen:  soft, + epigastric TTP, ND Skin:  no rash or induration seen on limited exam Musculoskeletal:  grossly normal tone BUE/BLE, good ROM, no bony abnormality Psychiatric:  blunted mood and  affect, speech fluent and appropriate, AOx3 Neurologic:  CN 2-12 grossly intact, moves all extremities in coordinated fashion   Radiological Exams on Admission: Independently reviewed - see discussion in A/P where applicable  DG Chest 2 View  Result Date: 11/22/2022 CLINICAL DATA:  Chest pain EXAM: CHEST - 2 VIEW COMPARISON:  04/13/2022 FINDINGS: Prior CABG. Heart and mediastinal contours within normal limits. Interstitial prominence throughout the lungs. Suspect trace bilateral effusions. No acute bony abnormality. Aortic atherosclerosis. IMPRESSION: Interstitial prominence within the lungs, slightly basilar predominant. This could reflect chronic lung disease, edema, or atypical infection. Electronically Signed   By: Charlett Nose M.D.   On: 11/22/2022 22:30    EKG: Independently reviewed.  Sinus tachycardia with rate 103; nonspecific ST changes with no evidence of acute ischemia   Labs on Admission: I have personally reviewed the available labs and imaging studies at the time of the admission.  Pertinent labs:   Glucose 114 BUN 19/Creatinine 1.22/GR 44 - stable Mag++ 1.6 HS troponin 37, 128, 492 WBC 10 Hgb 6.2, 7.9 after 2 units PRBC, 7.9    Assessment and Plan: Principal Problem:   Acute gastrointestinal bleeding Active Problems:   OSA (obstructive sleep apnea)   Chronic fatigue syndrome with fibromyalgia   Hyperlipidemia   Hypertension   Symptomatic anemia   A-fib   Non-ST elevation (NSTEMI) myocardial infarction   Chronic combined systolic (congestive) and diastolic (congestive) heart failure   Chronic kidney disease, stage 3b   DNR (do not resuscitate)    Acute GI Bleeding -Patient presented with CP/SOB -She is on Eliquis -In the ER she has developed frankly bloody stools, +epigastric TTP -While lower GI bleeding is a consideration, more rapid UGI bleeding currently appears more likely -The patient is not tachycardic with normal blood pressure, suggesting subacute  volume loss.  -Will admit to progressive bed  -GI consulted by ED, will follow up recommendations -NPO for possible EGD -LR at 100 mL/hr -Start IV pantoprazole bolus and infusion given frank bleeding with concern for hemodynamic instability -Zofran IV for nausea -Avoid NSAIDs and SQ heparin -Maintain IV access (2 large bore IVs if possible). -Hold Eliquis  ABLA -Patient's SOB and chest pain are most likely caused by anemia secondary to upper GI bleeding.  -Her Hgb was 6.2 on presentation  -Type and screen were done in ED.  -Two units of blood were ordered by ED.  -Monitor closely and follow cbc q12h, transfuse as necessary for Hbg <7 (<10 if chest pain recurs).  Chest pain with h/o vasculopathy, NSTEMI -h/o CAD s/p CABG and stents -CP in the setting of apparent GI bleed -Increasing troponin -Likely demand ischemia in the setting of anemia but still uptrending -Cardiology has consulted -Echo pending  Afib -Hold Eliquis in the setting of GI bleeding -Rate controlled with Toprol XL  Chronic combined CHF -Hold Entresto, Lasix -Appears to be compensated at this time -Cardiology is following  Chronic pain/chronic fatigue/fibromyalgia  -Continue citalopram, Vicodin, Robaxin -I have reviewed this patient in the [place] Controlled Substances Reporting System.  She is receiving medications from two providers but appears to be taking them as prescribed. -She is at high risk of opioid misuse, diversion, or overdose.   Stage 3b CKD -Appears to be stable at this time -Attempt to avoid nephrotoxic medications -Hold Entresto -Recheck BMP in AM   HTN -Continue amlodipine, Toprol XL  HLD -Continue simvastatin  OSA -Will order CPAP  DNR -I have discussed code status with the patient and her daughter and  they are in agreement that the patient would not desire resuscitation and would prefer to die a natural death should that situation arise.     Advance Care Planning:   Code  Status: DNR   Consults: GI; Cardiology  DVT Prophylaxis: SCDs  Family Communication: Daughter was present throughout evaluation  Severity of Illness: The appropriate patient status for this patient is INPATIENT. Inpatient status is judged to be reasonable and necessary in order to provide the required intensity of service to ensure the patient's safety. The patient's presenting symptoms, physical exam findings, and initial radiographic and laboratory data in the context of their chronic comorbidities is felt to place them at high risk for further clinical deterioration. Furthermore, it is not anticipated that the patient will be medically stable for discharge from the hospital within 2 midnights of admission.   * I certify that at the point of admission it is my clinical judgment that the patient will require inpatient hospital care spanning beyond 2 midnights from the point of admission due to high intensity of service, high risk for further deterioration and high frequency of surveillance required.*  Author: Jonah Blue, MD 11/23/2022 3:43 PM  For on call review [email or web address].

## 2022-11-23 NOTE — Consult Note (Signed)
Reason for Consult: Hematochezia Referring Physician: [facility]  Dionisio Paschal HPI: This is an 82 year old female with a PMH of pan UC, diverticula, AAA, CAD s/p CABG, HTN, hyperlipidemia, and Fibromyalgia admitted for complaints of chest pain and pressure.  While watching television at home she started to experience chest pain.  Her symptoms were significant enough for her to present to the ER.  NTG resolved the symptoms.  While in the ER she had a couple of bloody bowel movements and it was reported as bloody diarrhea, however, she denies being ill with any viruses or bacterial infections.  She did not report having any abdominal pain.  Her last colonoscopy was on 04/28/2019 with findings of a proximal UC, but the biopsies only showed minimally active colitis.  Diverticula were also found.  An EGD was performed on 05/08/2004 with a colonoscopy for IDA.  The EGD was normal and she was diagnosed with UC.   Her admission showed that her HGB was at 6.2 g/dL, which is a drop from 96.0 g/dL on 4/54/0981.  There was no evidence of a microcytosis in September as the MCV was 82.7, but now it is at 64.8.  Her troponins increased and cardiology states that this is from demand ischemia.  Past Medical History:  Diagnosis Date   Abdominal aortic aneurysm    Anemia, improved 01/11/2013   Basal cell carcinoma    CAD (coronary artery disease)    hx CABG 1998, last cath 2007 with PCI and stenting to the proximal segment of the vein graft to the diagonal branch. DES Taxus was placed   Cervicalgia    Chronic fatigue fibromyalgia syndrome    Chronic pain syndrome    Depression    Fibromyalgia    History of nuclear stress test 08/07/2011   lexiscan; no evidence of ischemia or infarct; low risk    Hyperlipidemia    Hypertension    OSA (obstructive sleep apnea)    PAD (peripheral artery disease)    Pes anserine bursitis    Pulmonary embolism 01/11/2013   S/P resection of aortic aneurysm     Trochanteric bursitis    Ulcerative colitis     Past Surgical History:  Procedure Laterality Date   AORTOBIEXTERNAL ILIAC BYPASS  1991   CATARACT EXTRACTION Left 09/2019   CHOLECYSTECTOMY  1997   CORONARY ANGIOPLASTY WITH STENT PLACEMENT  2006   to LAD, ATRETIC LIMA AT THAT TIME ALSO   CORONARY ANGIOPLASTY WITH STENT PLACEMENT  2007   STENT TO PROX VG-DIAG, OTHER STENTS PATENT   CORONARY ARTERY BYPASS GRAFT  1998   LIMA-LAD;VG-DIAG & RCA   CORONARY STENT PLACEMENT  2005   TO LAD & LCX-STENTS,   HERNIA REPAIR     TUBAL LIGATION     BTL    Family History  Problem Relation Age of Onset   Heart disease Father    Heart attack Father        @ 42   Cancer Paternal Aunt        BREAST   Heart failure Mother        at 80   Healthy Sister    Cancer Brother        pancreatic    Heart failure Maternal Grandmother    Heart attack Maternal Grandfather    Heart attack Paternal Grandmother        at 37   Healthy Daughter    Healthy Daughter     Social History:  reports that she quit smoking about 32 years ago. Her smoking use included cigarettes. She has a 50.00 pack-year smoking history. She has never been exposed to tobacco smoke. She has never used smokeless tobacco. She reports that she does not drink alcohol and does not use drugs.  Allergies:  Allergies  Allergen Reactions   Biotin Hives   Lisinopril Cough   Penicillin G     Other reaction(s): rash   Atenolol Other (See Comments)    colitis Other reaction(s): Bowel issues   Duloxetine Hcl Other (See Comments)    Mad pt feel spacey Other reaction(s): shaky; tired in the PMs   Lyrica [Pregabalin] Other (See Comments)    Made pt feel spacey   Penicillins Rash   Sulfamethoxazole-Trimethoprim Nausea And Vomiting    Other reaction(s): elevated liver functions    Medications: Scheduled:  sodium chloride   Intravenous Once   amLODipine  5 mg Oral Daily   citalopram  20 mg Oral Daily   metoprolol succinate  50 mg Oral  Daily   [START ON 11/26/2022] pantoprazole  40 mg Intravenous Q12H   simvastatin  40 mg Oral Daily   sodium chloride flush  3 mL Intravenous Q12H   Continuous:  lactated ringers 100 mL/hr at 11/23/22 1049   pantoprazole 8 mg/hr (11/23/22 1113)    Results for orders placed or performed during the hospital encounter of 11/22/22 (from the past 24 hour(s))  Basic metabolic panel     Status: Abnormal   Collection Time: 11/22/22 10:15 PM  Result Value Ref Range   Sodium 141 135 - 145 mmol/L   Potassium 4.3 3.5 - 5.1 mmol/L   Chloride 108 98 - 111 mmol/L   CO2 22 22 - 32 mmol/L   Glucose, Bld 132 (H) 70 - 99 mg/dL   BUN 23 8 - 23 mg/dL   Creatinine, Ser 1.61 (H) 0.44 - 1.00 mg/dL   Calcium 9.1 8.9 - 09.6 mg/dL   GFR, Estimated 38 (L) >60 mL/min   Anion gap 11 5 - 15  CBC     Status: Abnormal   Collection Time: 11/22/22 10:15 PM  Result Value Ref Range   WBC 7.9 4.0 - 10.5 K/uL   RBC 3.35 (L) 3.87 - 5.11 MIL/uL   Hemoglobin 6.2 (LL) 12.0 - 15.0 g/dL   HCT 04.5 (L) 40.9 - 81.1 %   MCV 64.8 (L) 80.0 - 100.0 fL   MCH 18.5 (L) 26.0 - 34.0 pg   MCHC 28.6 (L) 30.0 - 36.0 g/dL   RDW 91.4 (H) 78.2 - 95.6 %   Platelets 220 150 - 400 K/uL   nRBC 0.0 0.0 - 0.2 %  Troponin I (High Sensitivity)     Status: Abnormal   Collection Time: 11/22/22 10:15 PM  Result Value Ref Range   Troponin I (High Sensitivity) 37 (H) <18 ng/L  Troponin I (High Sensitivity)     Status: Abnormal   Collection Time: 11/23/22 12:00 AM  Result Value Ref Range   Troponin I (High Sensitivity) 128 (HH) <18 ng/L  Type and screen [facility]     Status: None (Preliminary result)   Collection Time: 11/23/22 12:00 AM  Result Value Ref Range   ABO/RH(D) B POS    Antibody Screen NEG    Sample Expiration 11/26/2022,2359    Unit Number [id number]    Blood Component Type RED CELLS,LR    Unit division 00    Status of Unit ISSUED  Transfusion Status OK TO TRANSFUSE    Crossmatch Result Compatible     Unit Number [id number]    Blood Component Type RED CELLS,LR    Unit division 00    Status of Unit ISSUED    Transfusion Status OK TO TRANSFUSE    Crossmatch Result      Compatible Performed at [facility], [address] [place], [place] [address]   Hepatic function panel     Status: Abnormal   Collection Time: 11/23/22 12:00 AM  Result Value Ref Range   Total Protein 6.0 (L) 6.5 - 8.1 g/dL   Albumin 3.4 (L) 3.5 - 5.0 g/dL   AST 20 15 - 41 U/L   ALT 11 0 - 44 U/L   Alkaline Phosphatase 32 (L) 38 - 126 U/L   Total Bilirubin <0.1 (L) 0.3 - 1.2 mg/dL   Bilirubin, Direct <7.8 0.0 - 0.2 mg/dL   Indirect Bilirubin NOT CALCULATED 0.3 - 0.9 mg/dL  Lipase, blood     Status: None   Collection Time: 11/23/22 12:00 AM  Result Value Ref Range   Lipase 37 11 - 51 U/L  ABO/Rh     Status: None   Collection Time: 11/23/22 12:10 AM  Result Value Ref Range   ABO/RH(D)      B POS Performed at [facility], [address] [place], [place] [address]   Prepare RBC (crossmatch)     Status: None   Collection Time: 11/23/22 12:14 AM  Result Value Ref Range   Order Confirmation      ORDER PROCESSED BY BLOOD BANK Performed at [facility], [address], [place], [place] [address]   CBC with Differential/Platelet     Status: Abnormal   Collection Time: 11/23/22  5:52 AM  Result Value Ref Range   WBC 10.0 4.0 - 10.5 K/uL   RBC 3.84 (L) 3.87 - 5.11 MIL/uL   Hemoglobin 7.9 (L) 12.0 - 15.0 g/dL   HCT 57.8 (L) 46.9 - 62.9 %   MCV 68.8 (L) 80.0 - 100.0 fL   MCH 20.6 (L) 26.0 - 34.0 pg   MCHC 29.9 (L) 30.0 - 36.0 g/dL   RDW 52.8 (H) 41.3 - 24.4 %   Platelets 198 150 - 400 K/uL   nRBC 0.2 0.0 - 0.2 %   Neutrophils Relative % 70 %   Neutro Abs 7.1 1.7 - 7.7 K/uL   Lymphocytes Relative 17 %   Lymphs Abs 1.7 0.7 - 4.0 K/uL   Monocytes Relative 11 %   Monocytes Absolute 1.1 (H) 0.1 - 1.0 K/uL   Eosinophils Relative 1 %   Eosinophils Absolute 0.1 0.0 - 0.5 K/uL   Basophils  Relative 1 %   Basophils Absolute 0.1 0.0 - 0.1 K/uL   Immature Granulocytes 0 %   Abs Immature Granulocytes 0.04 0.00 - 0.07 K/uL  Comprehensive metabolic panel     Status: Abnormal   Collection Time: 11/23/22  5:52 AM  Result Value Ref Range   Sodium 138 135 - 145 mmol/L   Potassium 4.4 3.5 - 5.1 mmol/L   Chloride 109 98 - 111 mmol/L   CO2 23 22 - 32 mmol/L   Glucose, Bld 114 (H) 70 - 99 mg/dL   BUN 19 8 - 23 mg/dL   Creatinine, Ser 0.10 (H) 0.44 - 1.00 mg/dL   Calcium 8.6 (L) 8.9 - 10.3 mg/dL   Total Protein 5.7 (L) 6.5 - 8.1 g/dL   Albumin  3.3 (L) 3.5 - 5.0 g/dL   AST 21 15 - 41 U/L   ALT 13 0 - 44 U/L   Alkaline Phosphatase 29 (L) 38 - 126 U/L   Total Bilirubin 1.1 0.3 - 1.2 mg/dL   GFR, Estimated 44 (L) >60 mL/min   Anion gap 6 5 - 15  Magnesium     Status: Abnormal   Collection Time: 11/23/22  5:52 AM  Result Value Ref Range   Magnesium 1.6 (L) 1.7 - 2.4 mg/dL  Troponin I (High Sensitivity)     Status: Abnormal   Collection Time: 11/23/22  2:28 PM  Result Value Ref Range   Troponin I (High Sensitivity) 492 (HH) <18 ng/L  CBC     Status: Abnormal   Collection Time: 11/23/22  2:28 PM  Result Value Ref Range   WBC 9.8 4.0 - 10.5 K/uL   RBC 3.94 3.87 - 5.11 MIL/uL   Hemoglobin 7.9 (L) 12.0 - 15.0 g/dL   HCT 40.9 (L) 81.1 - 91.4 %   MCV 66.8 (L) 80.0 - 100.0 fL   MCH 20.1 (L) 26.0 - 34.0 pg   MCHC 30.0 30.0 - 36.0 g/dL   RDW 78.2 (H) 95.6 - 21.3 %   Platelets 196 150 - 400 K/uL   nRBC 0.2 0.0 - 0.2 %     DG Chest 2 View  Result Date: 11/22/2022 CLINICAL DATA:  Chest pain EXAM: CHEST - 2 VIEW COMPARISON:  04/13/2022 FINDINGS: Prior CABG. Heart and mediastinal contours within normal limits. Interstitial prominence throughout the lungs. Suspect trace bilateral effusions. No acute bony abnormality. Aortic atherosclerosis. IMPRESSION: Interstitial prominence within the lungs, slightly basilar predominant. This could reflect chronic lung disease, edema, or atypical  infection. Electronically Signed   By: Charlett Nose M.D.   On: 11/22/2022 22:30    ROS:  As stated above in the HPI otherwise negative.  Blood pressure (!) 146/56, pulse 83, temperature 97.9 F (36.6 C), temperature source Oral, resp. rate 19, height  (1.626 m), weight 90 kg, SpO2 98 %.    PE: Gen: NAD, Alert and Oriented HEENT:  [place]/AT, EOMI Neck: Supple, no LAD Lungs: CTA Bilaterally CV: RRR without M/G/R ABD: Soft, NTND, +BS Ext: No C/C/E  Assessment/Plan: 1) Hematochezia. 2) Microcytic anemia. 3) Demand ischemia. 4) History of pan UC.   Kara Bush declined routine colonoscopy follow up as it was difficult for her to prep.  Even now she does not want to have the colonoscopy, but she is willing to have the endoscopy.  With the finding of the microcytosis and EGD is warranted, but she is willing to entertain the possibility of a colonoscopy pending the EGD findings.  With her history of UC, it is suspected that the bleeding is from the lower GI tract.  Plan: 1) EGD with Dr. Loreta Ave tomorrow. 2) Follow HGB and transfuse as necessary.  Evangelia Whitaker D 11/23/2022, 3:58 PM

## 2022-11-23 NOTE — ED Notes (Signed)
ED TO INPATIENT HANDOFF REPORT  ED Nurse Name and Phone #: Aron Baba Name/Age/Gender Kara Bush 82 y.o. female Room/Bed: 022C/022C  Code Status   Code Status: DNR  Home/SNF/Other Home Patient oriented to: self, place, time, and situation Is this baseline? Yes   Triage Complete: Triage complete  Chief Complaint Chest pain [R07.9]  Triage Note Patient arrived with EMS from home reports central chest pain with SOB this evening , no emesis or diaphoresis , she received 1 NTG sl and ASA 324 mg by EMS, history of CAD,Coronary Stents/CABG . Her cardiologist is Dr. Rennis Golden.    Allergies Allergies  Allergen Reactions   Biotin Hives   Lisinopril Cough   Penicillin G     Other reaction(s): rash   Atenolol Other (See Comments)    colitis Other reaction(s): Bowel issues   Duloxetine Hcl Other (See Comments)    Mad pt feel spacey Other reaction(s): shaky; tired in the PMs   Lyrica [Pregabalin] Other (See Comments)    Made pt feel spacey   Penicillins Rash   Sulfamethoxazole-Trimethoprim Nausea And Vomiting    Other reaction(s): elevated liver functions    Level of Care/Admitting Diagnosis ED Disposition     ED Disposition  Admit   Condition  --   Comment  Hospital Area: [facility] [[id number]]  Level of Care: Progressive [102]  Admit to Progressive based on following criteria: GI, ENDOCRINE disease patients with GI bleeding, acute liver failure or pancreatitis, stable with diabetic ketoacidosis or thyrotoxicosis (hypothyroid) state.  Admit to Progressive based on following criteria: CARDIOVASCULAR & THORACIC of moderate stability with acute coronary syndrome symptoms/low risk myocardial infarction/hypertensive urgency/arrhythmias/heart failure potentially compromising stability and stable post cardiovascular intervention patients.  May admit patient to [facility] or [facility] if equivalent level of care is available:: No  Covid Evaluation:  Asymptomatic - no recent exposure (last 10 days) testing not required  Diagnosis: Chest pain [409811]  Admitting Physician: Angie Fava [[id number]]  Attending Physician: Angie Fava [[id number]]  Certification:: I certify this patient will need inpatient services for at least 2 midnights  Estimated Length of Stay: 2          B Medical/Surgery History Past Medical History:  Diagnosis Date   Abdominal aortic aneurysm    Anemia, improved 01/11/2013   Basal cell carcinoma    CAD (coronary artery disease)    hx CABG 1998, last cath 2007 with PCI and stenting to the proximal segment of the vein graft to the diagonal branch. DES Taxus was placed   Cervicalgia    Chronic fatigue fibromyalgia syndrome    Chronic pain syndrome    Depression    Fibromyalgia    History of nuclear stress test 08/07/2011   lexiscan; no evidence of ischemia or infarct; low risk    Hyperlipidemia    Hypertension    OSA (obstructive sleep apnea)    PAD (peripheral artery disease)    Pes anserine bursitis    Pulmonary embolism 01/11/2013   S/P resection of aortic aneurysm    Trochanteric bursitis    Ulcerative colitis    Past Surgical History:  Procedure Laterality Date   AORTOBIEXTERNAL ILIAC BYPASS  1991   CATARACT EXTRACTION Left 09/2019   CHOLECYSTECTOMY  1997   CORONARY ANGIOPLASTY WITH STENT PLACEMENT  2006   to LAD, ATRETIC LIMA AT THAT TIME ALSO   CORONARY ANGIOPLASTY WITH STENT PLACEMENT  2007   STENT TO PROX VG-DIAG, OTHER  STENTS PATENT   CORONARY ARTERY BYPASS GRAFT  1998   LIMA-LAD;VG-DIAG & RCA   CORONARY STENT PLACEMENT  2005   TO LAD & LCX-STENTS,   HERNIA REPAIR     TUBAL LIGATION     BTL     A IV Location/Drains/Wounds Patient Lines/Drains/Airways Status     Active Line/Drains/Airways     Name Placement date Placement time Site Days   Peripheral IV 11/22/22 20 G Right Antecubital 11/22/22  2211  Antecubital  1            Intake/Output Last 24  hours  Intake/Output Summary (Last 24 hours) at 11/23/2022 1255 Last data filed at 11/23/2022 1114 Gross per 24 hour  Intake 783.17 ml  Output --  Net 783.17 ml    Labs/Imaging Results for orders placed or performed during the hospital encounter of 11/22/22 (from the past 48 hour(s))  Basic metabolic panel     Status: Abnormal   Collection Time: 11/22/22 10:15 PM  Result Value Ref Range   Sodium 141 135 - 145 mmol/L   Potassium 4.3 3.5 - 5.1 mmol/L   Chloride 108 98 - 111 mmol/L   CO2 22 22 - 32 mmol/L   Glucose, Bld 132 (H) 70 - 99 mg/dL    Comment: Glucose reference range applies only to samples taken after fasting for at least 8 hours.   BUN 23 8 - 23 mg/dL   Creatinine, Ser 0.98 (H) 0.44 - 1.00 mg/dL   Calcium 9.1 8.9 - 11.9 mg/dL   GFR, Estimated 38 (L) >60 mL/min    Comment: (NOTE) Calculated using the CKD-EPI Creatinine Equation (2021)    Anion gap 11 5 - 15    Comment: Performed at [facility], [address] [place], [place] [address]  CBC     Status: Abnormal   Collection Time: 11/22/22 10:15 PM  Result Value Ref Range   WBC 7.9 4.0 - 10.5 K/uL   RBC 3.35 (L) 3.87 - 5.11 MIL/uL   Hemoglobin 6.2 (LL) 12.0 - 15.0 g/dL    Comment: REPEATED TO VERIFY Reticulocyte Hemoglobin testing may be clinically indicated, consider ordering this additional test [id number] THIS CRITICAL RESULT HAS VERIFIED AND BEEN CALLED TO C. CHRISCOE, RN BY HAYLEE HOWARD ON 04 20 2024 AT 2248, AND HAS BEEN READ BACK.     HCT 21.7 (L) 36.0 - 46.0 %   MCV 64.8 (L) 80.0 - 100.0 fL   MCH 18.5 (L) 26.0 - 34.0 pg   MCHC 28.6 (L) 30.0 - 36.0 g/dL   RDW 65.7 (H) 84.6 - 96.2 %   Platelets 220 150 - 400 K/uL    Comment: REPEATED TO VERIFY   nRBC 0.0 0.0 - 0.2 %    Comment: Performed at [facility], [address], [place], [place] [address]  Troponin I (High Sensitivity)     Status: Abnormal   Collection Time: 11/22/22 10:15 PM  Result Value Ref Range   Troponin I (High Sensitivity)  37 (H) <18 ng/L    Comment: (NOTE) Elevated high sensitivity troponin I (hsTnI) values and significant  changes across serial measurements may suggest ACS but many other  chronic and acute conditions are known to elevate hsTnI results.  Refer to the "Links" section for chest pain algorithms and additional  guidance. Performed at [facility], [address] [place], [place] [address]   Troponin I (High Sensitivity)     Status: Abnormal   Collection Time:  11/23/22 12:00 AM  Result Value Ref Range   Troponin I (High Sensitivity) 128 (HH) <18 ng/L    Comment: CRITICAL RESULT CALLED TO, READ BACK BY AND VERIFIED WITH J. MOOREFIELD,RN. 1610 11/23/22. LPAIT (NOTE) Elevated high sensitivity troponin I (hsTnI) values and significant  changes across serial measurements may suggest ACS but many other  chronic and acute conditions are known to elevate hsTnI results.  Refer to the "Links" section for chest pain algorithms and additional  guidance. Performed at [facility], [address], [place], [place] [address]   Type and screen [facility]     Status: None (Preliminary result)   Collection Time: 11/23/22 12:00 AM  Result Value Ref Range   ABO/RH(D) B POS    Antibody Screen NEG    Sample Expiration 11/26/2022,2359    Unit Number [id number]    Blood Component Type RED CELLS,LR    Unit division 00    Status of Unit ISSUED    Transfusion Status OK TO TRANSFUSE    Crossmatch Result Compatible    Unit Number [id number]    Blood Component Type RED CELLS,LR    Unit division 00    Status of Unit ISSUED    Transfusion Status OK TO TRANSFUSE    Crossmatch Result      Compatible Performed at [facility], [address] [place], [place] [address]   Hepatic function panel     Status: Abnormal   Collection Time: 11/23/22 12:00 AM  Result Value Ref Range   Total Protein 6.0 (L) 6.5 - 8.1 g/dL   Albumin 3.4 (L) 3.5 - 5.0 g/dL   AST 20 15 - 41 U/L    ALT 11 0 - 44 U/L   Alkaline Phosphatase 32 (L) 38 - 126 U/L   Total Bilirubin <0.1 (L) 0.3 - 1.2 mg/dL   Bilirubin, Direct <2.8 0.0 - 0.2 mg/dL   Indirect Bilirubin NOT CALCULATED 0.3 - 0.9 mg/dL    Comment: Performed at [facility], [address], [place], [place] [address]  Lipase, blood     Status: None   Collection Time: 11/23/22 12:00 AM  Result Value Ref Range   Lipase 37 11 - 51 U/L    Comment: Performed at [facility], [address], [place], [place] [address]  ABO/Rh     Status: None   Collection Time: 11/23/22 12:10 AM  Result Value Ref Range   ABO/RH(D)      B POS Performed at [facility], [address] [place], [place] [address]   Prepare RBC (crossmatch)     Status: None   Collection Time: 11/23/22 12:14 AM  Result Value Ref Range   Order Confirmation      ORDER PROCESSED BY BLOOD BANK Performed at [facility], [address] [place], [place] [address]   CBC with Differential/Platelet     Status: Abnormal   Collection Time: 11/23/22  5:52 AM  Result Value Ref Range   WBC 10.0 4.0 - 10.5 K/uL   RBC 3.84 (L) 3.87 - 5.11 MIL/uL   Hemoglobin 7.9 (L) 12.0 - 15.0 g/dL    Comment: REPEATED TO VERIFY POST TRANSFUSION SPECIMEN Reticulocyte Hemoglobin testing may be clinically indicated, consider ordering this additional test [id number]    HCT 26.4 (L) 36.0 - 46.0 %   MCV 68.8 (L) 80.0 - 100.0 fL   MCH 20.6 (L) 26.0 - 34.0 pg   MCHC  29.9 (L) 30.0 - 36.0 g/dL   RDW 96.0 (H) 45.4 - 09.8 %   Platelets 198 150 - 400 K/uL    Comment: REPEATED TO VERIFY   nRBC 0.2 0.0 - 0.2 %   Neutrophils Relative % 70 %   Neutro Abs 7.1 1.7 - 7.7 K/uL   Lymphocytes Relative 17 %   Lymphs Abs 1.7 0.7 - 4.0 K/uL   Monocytes Relative 11 %   Monocytes Absolute 1.1 (H) 0.1 - 1.0 K/uL   Eosinophils Relative 1 %   Eosinophils Absolute 0.1 0.0 - 0.5 K/uL   Basophils Relative 1 %   Basophils Absolute 0.1 0.0 - 0.1 K/uL   Immature Granulocytes 0 %   Abs  Immature Granulocytes 0.04 0.00 - 0.07 K/uL    Comment: Performed at [facility], [address], [place], [place] [address]  Comprehensive metabolic panel     Status: Abnormal   Collection Time: 11/23/22  5:52 AM  Result Value Ref Range   Sodium 138 135 - 145 mmol/L   Potassium 4.4 3.5 - 5.1 mmol/L   Chloride 109 98 - 111 mmol/L   CO2 23 22 - 32 mmol/L   Glucose, Bld 114 (H) 70 - 99 mg/dL    Comment: Glucose reference range applies only to samples taken after fasting for at least 8 hours.   BUN 19 8 - 23 mg/dL   Creatinine, Ser 7.82 (H) 0.44 - 1.00 mg/dL   Calcium 8.6 (L) 8.9 - 10.3 mg/dL   Total Protein 5.7 (L) 6.5 - 8.1 g/dL   Albumin 3.3 (L) 3.5 - 5.0 g/dL   AST 21 15 - 41 U/L   ALT 13 0 - 44 U/L   Alkaline Phosphatase 29 (L) 38 - 126 U/L   Total Bilirubin 1.1 0.3 - 1.2 mg/dL   GFR, Estimated 44 (L) >60 mL/min    Comment: (NOTE) Calculated using the CKD-EPI Creatinine Equation (2021)    Anion gap 6 5 - 15    Comment: Performed at [facility], [address], [place], [place] [address]  Magnesium     Status: Abnormal   Collection Time: 11/23/22  5:52 AM  Result Value Ref Range   Magnesium 1.6 (L) 1.7 - 2.4 mg/dL    Comment: Performed at [facility], [address], [place], [place] [address]   DG Chest 2 View  Result Date: 11/22/2022 CLINICAL DATA:  Chest pain EXAM: CHEST - 2 VIEW COMPARISON:  04/13/2022 FINDINGS: Prior CABG. Heart and mediastinal contours within normal limits. Interstitial prominence throughout the lungs. Suspect trace bilateral effusions. No acute bony abnormality. Aortic atherosclerosis. IMPRESSION: Interstitial prominence within the lungs, slightly basilar predominant. This could reflect chronic lung disease, edema, or atypical infection. Electronically Signed   By: Charlett Nose M.D.   On: 11/22/2022 22:30    Pending Labs Unresulted Labs (From admission, onward)     Start     Ordered   11/24/22 0500  Basic metabolic panel  Tomorrow  morning,   R        11/23/22 0956   11/24/22 0500  CBC  Tomorrow morning,   R        11/23/22 0956   11/23/22 0956  CBC  Once,   R        11/23/22 0956            Vitals/Pain Today's Vitals   11/23/22 0844 11/23/22 1100 11/23/22 1112 11/23/22 1130  BP:  Marland Kitchen)  155/63 (!) 155/63 (!) 131/50  Pulse:  91 91 85  Resp:  19  17  Temp: 98.1 F (36.7 C)     TempSrc: Oral     SpO2:  100%  99%  Weight:      Height:      PainSc:        Isolation Precautions No active isolations  Medications Medications  0.9 %  sodium chloride infusion (Manually program via Guardrails IV Fluids) (0 mLs Intravenous Stopped 11/23/22 0604)  acetaminophen (TYLENOL) tablet 650 mg (has no administration in time range)    Or  acetaminophen (TYLENOL) suppository 650 mg (has no administration in time range)  melatonin tablet 3 mg (has no administration in time range)  ondansetron (ZOFRAN) injection 4 mg (4 mg Intravenous Given 11/23/22 0824)  HYDROcodone-acetaminophen (NORCO/VICODIN) 5-325 MG per tablet 1-1.5 tablet (1 tablet Oral Given 11/23/22 1054)  amLODipine (NORVASC) tablet 5 mg (5 mg Oral Given 11/23/22 1112)  metoprolol succinate (TOPROL-XL) 24 hr tablet 50 mg (50 mg Oral Given 11/23/22 1112)  simvastatin (ZOCOR) tablet 40 mg (40 mg Oral Given 11/23/22 1112)  citalopram (CELEXA) tablet 20 mg (20 mg Oral Given 11/23/22 1111)  zolpidem (AMBIEN) tablet 5 mg (has no administration in time range)  methocarbamol (ROBAXIN) tablet 500 mg (has no administration in time range)  lactated ringers infusion ( Intravenous New Bag/Given 11/23/22 1049)  morphine (PF) 2 MG/ML injection 2 mg (has no administration in time range)  hydrALAZINE (APRESOLINE) injection 5 mg (has no administration in time range)  sodium chloride flush (NS) 0.9 % injection 3 mL (3 mLs Intravenous Given 11/23/22 1110)  pantoprozole (PROTONIX) 80 mg /NS 100 mL infusion (8 mg/hr Intravenous New Bag/Given 11/23/22 1113)  pantoprazole (PROTONIX) injection  40 mg (has no administration in time range)  HYDROcodone-acetaminophen (NORCO/VICODIN) 5-325 MG per tablet 1 tablet (1 tablet Oral Given 11/23/22 0137)  pantoprazole (PROTONIX) 80 mg /NS 100 mL IVPB (0 mg Intravenous Stopped 11/23/22 1114)    Mobility walks with device     Focused Assessments Cardiac Assessment Handoff:    No results found for: "CKTOTAL", "CKMB", "CKMBINDEX", "TROPONINI" Lab Results  Component Value Date   DDIMER 1.03 (H) 06/22/2013   Does the Patient currently have chest pain? No    R Recommendations: See Admitting Provider Note  Report given to:   Additional Notes: Initial HgB 6.2, received 2 units PRBC. A/O x 4, continent B/B with multiple episodes of loose stool reported to admitting provider.

## 2022-11-23 NOTE — Plan of Care (Signed)

## 2022-11-23 NOTE — ED Notes (Signed)
ED TO INPATIENT HANDOFF REPORT  ED Nurse Name and Phone #: Kelvon Giannini  S Name/Age/Gender Kara Bush 82 y.o. female Room/Bed: 022C/022C  Code Status   Code Status: DNR  Home/SNF/Other Home Patient oriented to: self, place, time, and situation Is this baseline? Yes   Triage Complete: Triage complete  Chief Complaint Chest pain [R07.9]  Triage Note Patient arrived with EMS from home reports central chest pain with SOB this evening , no emesis or diaphoresis , she received 1 NTG sl and ASA 324 mg by EMS, history of CAD,Coronary Stents/CABG . Her cardiologist is Dr. Rennis Golden.    Allergies Allergies  Allergen Reactions   Biotin Hives   Lisinopril Cough   Penicillin G     Other reaction(s): rash   Atenolol Other (See Comments)    colitis Other reaction(s): Bowel issues   Duloxetine Hcl Other (See Comments)    Mad pt feel spacey Other reaction(s): shaky; tired in the PMs   Lyrica [Pregabalin] Other (See Comments)    Made pt feel spacey   Penicillins Rash   Sulfamethoxazole-Trimethoprim Nausea And Vomiting    Other reaction(s): elevated liver functions    Level of Care/Admitting Diagnosis ED Disposition     ED Disposition  Admit   Condition  --   Comment  Hospital Area: [facility] [[id number]]  Level of Care: Telemetry Cardiac [[id number]]  May admit patient to [facility] or [facility] if equivalent level of care is available:: No  Covid Evaluation: Asymptomatic - no recent exposure (last 10 days) testing not required  Diagnosis: Chest pain [956213]  Admitting Physician: Angie Fava [[id number]]  Attending Physician: Angie Fava [[id number]]  Certification:: I certify this patient will need inpatient services for at least 2 midnights  Estimated Length of Stay: 2          B Medical/Surgery History Past Medical History:  Diagnosis Date   Abdominal aortic aneurysm (HCC)    Anemia, improved 01/11/2013   Basal cell carcinoma    CAD  (coronary artery disease)    hx CABG 1998, last cath 2007 with PCI and stenting to the proximal segment of the vein graft to the diagonal branch. DES Taxus was placed   Cervicalgia    Chest wall pain    Chronic fatigue fibromyalgia syndrome    Chronic pain syndrome    Coronary artery disease    Depression    Fibromyalgia    Fibromyalgia    History of nuclear stress test 08/07/2011   lexiscan; no evidence of ischemia or infarct; low risk    Hyperlipidemia    Hypertension    Hypertension    Obstructive sleep apnea    OSA (obstructive sleep apnea)    PAD (peripheral artery disease) (HCC)    Pes anserine bursitis    Pulmonary embolism (HCC) 01/11/2013   S/P resection of aortic aneurysm    Trochanteric bursitis    Ulcerative colitis [facility])    Past Surgical History:  Procedure Laterality Date   AORTOBIEXTERNAL ILIAC BYPASS  1991   CATARACT EXTRACTION Left 09/2019   CHOLECYSTECTOMY  1997   CORONARY ANGIOPLASTY WITH STENT PLACEMENT  2006   to LAD, ATRETIC LIMA AT THAT TIME ALSO   CORONARY ANGIOPLASTY WITH STENT PLACEMENT  2007   STENT TO PROX VG-DIAG, OTHER STENTS PATENT   CORONARY ARTERY BYPASS GRAFT  1998   LIMA-LAD;VG-DIAG & RCA   CORONARY STENT PLACEMENT  2005   TO LAD & LCX-STENTS,   HERNIA REPAIR  TUBAL LIGATION     BTL     A IV Location/Drains/Wounds Patient Lines/Drains/Airways Status     Active Line/Drains/Airways     Name Placement date Placement time Site Days   Peripheral IV 11/22/22 20 G Right Antecubital 11/22/22  2211  Antecubital  1            Intake/Output Last 24 hours No intake or output data in the 24 hours ending 11/23/22 0324  Labs/Imaging Results for orders placed or performed during the hospital encounter of 11/22/22 (from the past 48 hour(s))  Basic metabolic panel     Status: Abnormal   Collection Time: 11/22/22 10:15 PM  Result Value Ref Range   Sodium 141 135 - 145 mmol/L   Potassium 4.3 3.5 - 5.1 mmol/L   Chloride 108 98 - 111  mmol/L   CO2 22 22 - 32 mmol/L   Glucose, Bld 132 (H) 70 - 99 mg/dL    Comment: Glucose reference range applies only to samples taken after fasting for at least 8 hours.   BUN 23 8 - 23 mg/dL   Creatinine, Ser 6.04 (H) 0.44 - 1.00 mg/dL   Calcium 9.1 8.9 - 54.0 mg/dL   GFR, Estimated 38 (L) >60 mL/min    Comment: (NOTE) Calculated using the CKD-EPI Creatinine Equation (2021)    Anion gap 11 5 - 15    Comment: Performed at [facility], [address] [place], [place] [address]  CBC     Status: Abnormal   Collection Time: 11/22/22 10:15 PM  Result Value Ref Range   WBC 7.9 4.0 - 10.5 K/uL   RBC 3.35 (L) 3.87 - 5.11 MIL/uL   Hemoglobin 6.2 (LL) 12.0 - 15.0 g/dL    Comment: REPEATED TO VERIFY Reticulocyte Hemoglobin testing may be clinically indicated, consider ordering this additional test [id number] THIS CRITICAL RESULT HAS VERIFIED AND BEEN CALLED TO C. CHRISCOE, RN BY HAYLEE HOWARD ON 04 20 2024 AT 2248, AND HAS BEEN READ BACK.     HCT 21.7 (L) 36.0 - 46.0 %   MCV 64.8 (L) 80.0 - 100.0 fL   MCH 18.5 (L) 26.0 - 34.0 pg   MCHC 28.6 (L) 30.0 - 36.0 g/dL   RDW 21.3 (H) 08.6 - 57.8 %   Platelets 220 150 - 400 K/uL    Comment: REPEATED TO VERIFY   nRBC 0.0 0.0 - 0.2 %    Comment: Performed at [facility], [address], [place], [place] [address]  Troponin I (High Sensitivity)     Status: Abnormal   Collection Time: 11/22/22 10:15 PM  Result Value Ref Range   Troponin I (High Sensitivity) 37 (H) <18 ng/L    Comment: (NOTE) Elevated high sensitivity troponin I (hsTnI) values and significant  changes across serial measurements may suggest ACS but many other  chronic and acute conditions are known to elevate hsTnI results.  Refer to the "Links" section for chest pain algorithms and additional  guidance. Performed at [facility], [address] [place], [place] [address]   Troponin I (High Sensitivity)     Status: Abnormal   Collection Time: 11/23/22 12:00  AM  Result Value Ref Range   Troponin I (High Sensitivity) 128 (HH) <18 ng/L    Comment: CRITICAL RESULT CALLED TO, READ BACK BY AND VERIFIED WITH J. MOOREFIELD,RN. 1324 11/23/22. LPAIT (NOTE) Elevated high sensitivity troponin I (hsTnI) values and significant  changes across serial measurements may suggest ACS but  many other  chronic and acute conditions are known to elevate hsTnI results.  Refer to the "Links" section for chest pain algorithms and additional  guidance. Performed at [facility], [address] [place], [place] [address]   Type and screen [facility]     Status: None (Preliminary result)   Collection Time: 11/23/22 12:00 AM  Result Value Ref Range   ABO/RH(D) B POS    Antibody Screen NEG    Sample Expiration 11/26/2022,2359    Unit Number [id number]    Blood Component Type RED CELLS,LR    Unit division 00    Status of Unit ISSUED    Transfusion Status OK TO TRANSFUSE    Crossmatch Result Compatible    Unit Number [id number]    Blood Component Type RED CELLS,LR    Unit division 00    Status of Unit ISSUED    Transfusion Status OK TO TRANSFUSE    Crossmatch Result      Compatible Performed at [facility], [address], [place], [place] [address]   Hepatic function panel     Status: Abnormal   Collection Time: 11/23/22 12:00 AM  Result Value Ref Range   Total Protein 6.0 (L) 6.5 - 8.1 g/dL   Albumin 3.4 (L) 3.5 - 5.0 g/dL   AST 20 15 - 41 U/L   ALT 11 0 - 44 U/L   Alkaline Phosphatase 32 (L) 38 - 126 U/L   Total Bilirubin <0.1 (L) 0.3 - 1.2 mg/dL   Bilirubin, Direct <6.2 0.0 - 0.2 mg/dL   Indirect Bilirubin NOT CALCULATED 0.3 - 0.9 mg/dL    Comment: Performed at [facility], [address] [place], [place] [address]  Lipase, blood     Status: None   Collection Time: 11/23/22 12:00 AM  Result Value Ref Range   Lipase 37 11 - 51 U/L    Comment: Performed at [facility], [address],  [place], [place] [address]  ABO/Rh     Status: None   Collection Time: 11/23/22 12:10 AM  Result Value Ref Range   ABO/RH(D)      B POS Performed at [facility], [address], [place], [place] [address]   Prepare RBC (crossmatch)     Status: None   Collection Time: 11/23/22 12:14 AM  Result Value Ref Range   Order Confirmation      ORDER PROCESSED BY BLOOD BANK Performed at [facility], [address] [place], [place] [address]    DG Chest 2 View  Result Date: 11/22/2022 CLINICAL DATA:  Chest pain EXAM: CHEST - 2 VIEW COMPARISON:  04/13/2022 FINDINGS: Prior CABG. Heart and mediastinal contours within normal limits. Interstitial prominence throughout the lungs. Suspect trace bilateral effusions. No acute bony abnormality. Aortic atherosclerosis. IMPRESSION: Interstitial prominence within the lungs, slightly basilar predominant. This could reflect chronic lung disease, edema, or atypical infection. Electronically Signed   By: Charlett Nose M.D.   On: 11/22/2022 22:30    Pending Labs Unresulted Labs (From admission, onward)     Start     Ordered   11/23/22 0500  CBC with Differential/Platelet  Tomorrow morning,   R        11/23/22 0208   11/23/22 0500  Comprehensive metabolic panel  Tomorrow morning,   R        11/23/22 0208   11/23/22 0209  Magnesium  Add-on,   AD  11/23/22 0208            Vitals/Pain Today's Vitals   11/23/22 0045 11/23/22 0115 11/23/22 0130 11/23/22 0221  BP: 133/64 (!) 138/56 (!) 125/58   Pulse: 83 82 70   Resp: Temp: 98.1 F (36.7 C)  98.5 F (36.9 C)   TempSrc: Oral  Oral   SpO2: 99% 98% 97%   PainSc:    6     Isolation Precautions No active isolations  Medications Medications  0.9 %  sodium chloride infusion (Manually program via Guardrails IV Fluids) (0 mLs Intravenous Hold 11/23/22 0038)  acetaminophen (TYLENOL) tablet 650 mg (has no administration in time range)    Or  acetaminophen (TYLENOL) suppository 650  mg (has no administration in time range)  melatonin tablet 3 mg (has no administration in time range)  ondansetron (ZOFRAN) injection 4 mg (has no administration in time range)  HYDROcodone-acetaminophen (NORCO/VICODIN) 5-325 MG per tablet 1 tablet (1 tablet Oral Given 11/23/22 0137)    Mobility walks     Focused Assessments Cardiac Assessment Handoff:    No results found for: "CKTOTAL", "CKMB", "CKMBINDEX", "TROPONINI" Lab Results  Component Value Date   DDIMER 1.03 (H) 06/22/2013   Does the Patient currently have chest pain? No    R Recommendations: See Admitting Provider Note  Report given to:   Additional Notes:

## 2022-11-23 NOTE — Consult Note (Signed)
Cardiology Consultation   Patient ID: LEANNAH Bush MRN: [id number]; DOB: Jan 01, 1941  Admit date: 11/22/2022 Date of Consult: 11/23/2022  PCP:  Jackelyn Poling, DO   [facility] Providers Cardiologist:  Chrystie Nose, MD        Patient Profile:   Kara Bush is a 82 y.o. female with a hx of coronary artery disease status post coronary artery bypass surgery (LIMA-->LAD; SVG-->Dx and RCA) with multiple percutaneous interventions,  peripheral arterial disease status post aorto-iliac bypass, paroxysmal atrial fibrillation on oral anticoagulation, hypertension, hypercholesteremia, bicuspid aortic valve with mild to moderate regurgitation, who is being seen 11/23/2022 for the evaluation of chest pain and worsening anemia concerning for acute blood loss anemia at the request of Dr. Pilar Plate.  History of Present Illness:   Kara Bush states yesterday evening while watching television she suddenly noticed chest pain. Chest pain described as center of her chest, non-radiating, achy-squeezing pain, 7/10 in intensity, and without any other associated symptoms such as nausea or shortness of breath.  After a couple of hours of chest pain, she called 911 to her home and was brought to our emergency department.  She states the chest pain was eventually relieved by the nitroglycerin given by the emergency medical service.  She is now chest pain free.  She reports shortness of breath when she exerts herself at home but chronic and unchanged from prior.  Of note, patient denies vomiting, dark tarry stools, or bloody stools.  In the emergency room ECG performed did show ST-depressions in the inferolateral leads, worse from prior ECG.  HS-troponin was elevated and increased.  Hemoglobin was checked and was initially 6.2 g/dL, and new worsening anemia from prior.  Patient has received a blood transfusion.     Past Medical History:  Diagnosis Date   Abdominal aortic aneurysm    Anemia,  improved 01/11/2013   Basal cell carcinoma    CAD (coronary artery disease)    hx CABG 1998, last cath 2007 with PCI and stenting to the proximal segment of the vein graft to the diagonal branch. DES Taxus was placed   Cervicalgia    Chest wall pain    Chronic fatigue fibromyalgia syndrome    Chronic pain syndrome    Coronary artery disease    Depression    Fibromyalgia    Fibromyalgia    History of nuclear stress test 08/07/2011   lexiscan; no evidence of ischemia or infarct; low risk    Hyperlipidemia    Hypertension    Hypertension    Obstructive sleep apnea    OSA (obstructive sleep apnea)    PAD (peripheral artery disease)    Pes anserine bursitis    Pulmonary embolism 01/11/2013   S/P resection of aortic aneurysm    Trochanteric bursitis    Ulcerative colitis     Past Surgical History:  Procedure Laterality Date   AORTOBIEXTERNAL ILIAC BYPASS  1991   CATARACT EXTRACTION Left 09/2019   CHOLECYSTECTOMY  1997   CORONARY ANGIOPLASTY WITH STENT PLACEMENT  2006   to LAD, ATRETIC LIMA AT THAT TIME ALSO   CORONARY ANGIOPLASTY WITH STENT PLACEMENT  2007   STENT TO PROX VG-DIAG, OTHER STENTS PATENT   CORONARY ARTERY BYPASS GRAFT  1998   LIMA-LAD;VG-DIAG & RCA   CORONARY STENT PLACEMENT  2005   TO LAD & LCX-STENTS,   HERNIA REPAIR     TUBAL LIGATION     BTL     Home Medications:  Prior to Admission medications   Medication Sig Start Date End Date Taking? Authorizing Provider  amLODipine (NORVASC) 5 MG tablet TAKE ONE TABLET BY MOUTH ONCE daily 07/21/22   Hilty, Lisette Abu, MD  b complex vitamins capsule Take 1 capsule by mouth daily.    [provider]  betamethasone dipropionate 0.05 % cream 1 application    [provider]  citalopram (CELEXA) 20 MG tablet Take 1 tablet by mouth daily.    [provider]  clobetasol ointment (TEMOVATE) 0.05 % APPLY THIN LAYER TO AFFECTED AREAS EXTERNALLY TWO TO THREE TIMES WEEKLY AS NEEDED 90 DAYS    [provider]  ELIQUIS 5 MG TABS tablet TAKE ONE TABLET BY MOUTH twice daily 07/21/22   Hilty, Lisette Abu, MD  ENTRESTO 97-103 MG TAKE ONE TABLET BY MOUTH TWICE DAILY 10/23/22   Hilty, Lisette Abu, MD  ezetimibe (ZETIA) 10 MG tablet TAKE ONE TABLET BY MOUTH ONCE daily 07/21/22   Hilty, Lisette Abu, MD  furosemide (LASIX) 40 MG tablet Take 1 tablet (40 mg total) by mouth 3 (three) times a week. Increase to every day as needed for weight gain of 2lbs overnight or 5lbs in 1 week 09/26/22   Chrystie Nose, MD  HYDROcodone-acetaminophen (NORCO/VICODIN) 5-325 MG tablet Take 1 tablet by mouth 4 (four) times daily as needed for moderate pain. May Take an extra 0.5 to one tablet when pain is severe. 10/17/22   Jones Bales, NP  methocarbamol (ROBAXIN) 500 MG tablet TAKE 1 TABLET BY MOUTH EVERY 8 HOURS AS NEEDED FOR MUSCLE SPASM 10/23/22   Jones Bales, NP  metoprolol succinate (TOPROL-XL) 50 MG 24 hr tablet TAKE ONE TABLET BY MOUTH ONCE daily 10/23/22   Hilty, Lisette Abu, MD  Multiple Vitamin (MULITIVITAMIN WITH MINERALS) TABS Take 1 tablet by mouth daily.    [provider]  ondansetron (ZOFRAN) 4 MG tablet Take 1 tablet by mouth every 6 (six) hours as needed.    [provider]  potassium chloride (KLOR-CON M) 10 MEQ tablet Take 1 tablet (10 mEq total) by mouth 3 (three) times a week. And with extra lasix as needed. 09/26/22   Chrystie Nose, MD  simvastatin (ZOCOR) 40 MG tablet TAKE ONE TABLET BY MOUTH ONCE daily 07/21/22   Hilty, Lisette Abu, MD  zolpidem (AMBIEN CR) 12.5 MG CR tablet Take 12.5 mg by mouth at bedtime as needed. 09/11/21   [provider]    Inpatient Medications: Scheduled Meds:  sodium chloride   Intravenous Once   Continuous Infusions:  PRN Meds: acetaminophen **OR** acetaminophen, melatonin, ondansetron (ZOFRAN) IV  Allergies:    Allergies  Allergen Reactions   Biotin Hives   Lisinopril Cough   Penicillin G     Other reaction(s): rash   Atenolol  Other (See Comments)    colitis Other reaction(s): Bowel issues   Duloxetine Hcl Other (See Comments)    Mad pt feel spacey Other reaction(s): shaky; tired in the PMs   Lyrica [Pregabalin] Other (See Comments)    Made pt feel spacey   Penicillins Rash   Sulfamethoxazole-Trimethoprim Nausea And Vomiting    Other reaction(s): elevated liver functions    Social History:   Social History   Socioeconomic History   Marital status: Divorced    Spouse name: Not on file   Number of children: 2   Years of education: college    Highest education level: Not on file  Occupational History    Employer: RETIRED  Tobacco Use   Smoking status: Former    Packs/day: 2.50    Years: 20.00    Additional pack years: 0.00    Total pack years: 50.00    Types: Cigarettes    Quit date: 06/29/1990    Years since quitting: 32.4    Passive exposure: Never   Smokeless tobacco: Never  Vaping Use   Vaping Use: Never used  Substance and Sexual Activity   Alcohol use: No   Drug use: No   Sexual activity: Not on file  Other Topics Concern   Not on file  Social History Narrative   Not on file   Social Determinants of Health   Financial Resource Strain: Not on file  Food Insecurity: Not on file  Transportation Needs: Not on file  Physical Activity: Not on file  Stress: Not on file  Social Connections: Not on file  Intimate Partner Violence: Not on file    Family History:   Family History  Problem Relation Age of Onset   Heart disease Father    Heart attack Father        @ 60   Cancer Paternal Aunt        BREAST   Heart failure Mother        at 45   Healthy Sister    Cancer Brother        pancreatic    Heart failure Maternal Grandmother    Heart attack Maternal Grandfather    Heart attack Paternal Grandmother        at 40   Healthy Daughter    Healthy Daughter      ROS:  Please see the history of present illness.  All other ROS reviewed and negative.     Physical Exam/Data:    Vitals:   11/23/22 0500 11/23/22 0511 11/23/22 0515 11/23/22 0530  BP: 125/66 125/66 (!) 140/53 (!) 146/64  Pulse:  86  68  Resp: 16 16 15 14   Temp:  98.1 F (36.7 C)    TempSrc:  Oral    SpO2:  99%  93%  Weight:      Height:        Intake/Output Summary (Last 24 hours) at 11/23/2022 0549 Last data filed at 11/23/2022 0500 Gross per 24 hour  Intake 672.5 ml  Output --  Net 672.5 ml      11/23/2022    3:25 AM 10/17/2022    1:36 PM 09/25/2022    1:28 PM  Last 3 Weights  Weight (lbs) 198 lb 6.6 oz 198 lb 6.4 oz 197 lb  Weight (kg) 90 kg 89.994 kg 89.359 kg     Body mass index is 34.06 kg/m.  General:  Well nourished, well developed, in no acute distress HEENT: normal Neck: no JVD Vascular: No carotid bruits; Distal pulses 2+ bilaterally Cardiac:  normal S1, S2; RRR; 1/6 crescendo murmur heard best RUSB Lungs:  clear to auscultation bilaterally, no wheezing, rhonchi or rales  Abd: soft, nontender, no hepatomegaly  Ext: no edema Musculoskeletal:  No deformities, BUE and BLE strength normal and equal Skin: warm and dry  Neuro:  CNs 2-12 intact, no focal abnormalities noted Psych:  Normal affect   EKG:  The EKG was personally reviewed and demonstrates:  11/22/22 (21:56): NSR; low voltage QRS; ST depressions and non-specific T-wave changes in inferolateral leads; (largely unchanged from 09/25/22 ECG) Telemetry:  Telemetry was personally reviewed and demonstrates:    Relevant CV Studies: # Echocardiogram 08/27/22 IMPRESSIONS  1. Left ventricular ejection fraction, by estimation, is 60 to 65%. The  left ventricle has normal function. The left ventricle has no regional  wall motion abnormalities. There is mild concentric left ventricular  hypertrophy. Left ventricular diastolic  parameters are consistent with Grade I diastolic dysfunction (impaired  relaxation).   2. Right ventricular systolic function is normal. The right ventricular  size is normal.   3. Left atrial size  was mildly dilated.   4. The mitral valve is normal in structure. Mild to moderate mitral valve  regurgitation. No evidence of mitral stenosis.   5. Tricuspid valve regurgitation is severe.   6. 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 Aortic valve regurgitation is mild to  moderate. Mild to moderate aortic valve stenosis. Aortic regurgitation PHT  measures 351 msec. Aortic valve area, by VTI measures 1.15 cm. Aortic  valve mean gradient measures 16.8  mmHg. Aortic valve Vmax measures 2.69 m/s.   7. The inferior vena cava is normal in size with greater than 50%  respiratory variability, suggesting right atrial pressure of 3 mmHg.    Laboratory Data:  High Sensitivity Troponin:   Recent Labs  Lab 11/22/22 2215 11/23/22 0000  TROPONINIHS 37* 128*     Chemistry Recent Labs  Lab 11/22/22 2215  NA 141  K 4.3  CL 108  CO2 22  GLUCOSE 132*  BUN 23  CREATININE 1.37*  CALCIUM 9.1  GFRNONAA 38*  ANIONGAP 11    Recent Labs  Lab 11/23/22 0000  PROT 6.0*  ALBUMIN 3.4*  AST 20  ALT 11  ALKPHOS 32*  BILITOT <0.1*   Lipids No results for input(s): "CHOL", "TRIG", "HDL", "LABVLDL", "LDLCALC", "CHOLHDL" in the last 168 hours.  Hematology Recent Labs  Lab 11/22/22 2215  WBC 7.9  RBC 3.35*  HGB 6.2*  HCT 21.7*  MCV 64.8*  MCH 18.5*  MCHC 28.6*  RDW 16.5*  PLT 220   Thyroid No results for input(s): "TSH", "FREET4" in the last 168 hours.  BNPNo results for input(s): "BNP", "PROBNP" in the last 168 hours.  DDimer No results for input(s): "DDIMER" in the last 168 hours.   Radiology/Studies:  DG Chest 2 View  Result Date: 11/22/2022 CLINICAL DATA:  Chest pain EXAM: CHEST - 2 VIEW COMPARISON:  04/13/2022 FINDINGS: Prior CABG. Heart and mediastinal contours within normal limits. Interstitial prominence throughout the lungs. Suspect trace bilateral effusions. No acute bony abnormality. Aortic atherosclerosis. IMPRESSION: Interstitial prominence within the lungs, slightly basilar  predominant. This could reflect chronic lung disease, edema, or atypical infection. Electronically Signed   By: Charlett Nose M.D.   On: 11/22/2022 22:30     Assessment and Plan:   Chest pain: Chest pain likely angina in the setting of acute blood loss anemia leading to type II myocardial infarction.  She is now chest pain free and hemodynamically stable.   --Apixaban is now being held.   --Agree with blood transfusion.    Anemia: Patient with baseline anemia now with a 6 g/dL drop, concerning for acute blood loss anemia, unclear source.  Patient takes oral anticoagulation for her paroxysmal atrial fibrillation at home.  Chest pain likely due to demand ischemia from acute blood loss anemia. --Management per primary team.  --Reasonable to transfuse patient at this time.    Polyvascular disease: Patient with coronary artery disease status post revascularization and peripheral arterial disease status post aorta-iliac bypass.  Now with chest pain thought secondary to demand ischemia in the setting of probable acute blood loss  anemia.  On appropriate guideline therapies absent high-intensity statin therapy (simvastatin, ezetimibe, metoprolol succinate, and single agent apixaban). --Reasonable to hold metoprolol succinate if needed given acute blood loss anemia.  Paroxysmal atrial fibrillation: Known paroxysmal atrial fibrillation.  Patient with a CHA2DS2 VASc score of 4, moderate risk for thrombo-embolism.  Patient was on oral anticoagualation prior.  --Please hold apixaban given acute blood loss anemia.    Hypertension Patient with primary hypertension with stage IIIb chronic kidney disease.  Blood pressure at this time at goal.  On multiple antihypertensive medications. --If concern for potential shock due to acute blood loss anemia, reasonable to hold blood pressure medications.    Risk Assessment/Risk Scores:          CHA2DS2-VASc Score = 5   This indicates a 7.2% annual risk of  stroke. The patient's score is based upon: CHF History: 0 HTN History: 1 Diabetes History: 0 Stroke History: 0 Vascular Disease History: 1 Age Score: 2 Gender Score: 1         For questions or updates, please contact [facility] Please consult [email or web address] for contact info under    Signed, Judie Grieve, MD  11/23/2022 5:49 AM

## 2022-11-23 NOTE — Progress Notes (Signed)
Brief cardiology progress note:  Reviewed consult note from this AM, agree with plan. Daughter at bedside, endorses that patient has chronic pain, but she is resting comfortably at this time. HsTn 37 -> 128 with third pending. Initial Hgb 6.9, prior was 12.0. Incremented post transfusion. Echo ordered. Per daughter, plan is for endoscopy tomorrow, though GI note not available at this time.  Given acute anemia requiring transfusion, no heparin or plans for cath at this time. Will await echo, results of endoscopy to determine next steps.  Jodelle Red, MD, PhD, [facility]  [facility]  [place]  [facility] & Vascular at [facility] at [facility] [address] [place], [place] [address] [phone]

## 2022-11-23 NOTE — Progress Notes (Signed)
  Carryover admission to the Day Admitter.  I discussed this case with the EDP, Dr.  Pilar Plate.  Per these discussions:   This is a 82 year old female with history of coronary artery disease status post CABG several years ago, who is being admitted with  chest pain, acute vs subacute anemia, and elevated troponin after presenting with intermittent chest discomfort over the last few days, associated with mild shortness of breath.  Currently without any chest pain, after most recent episode resolved spontaneously, in the absence of any sublingual nitroglycerin.  She denies any melena or hematochezia, no hematemesis.  It appears that she is on Eliquis as an outpatient.  Vital signs appear stable, in the absence of any tachycardia, with normotensive blood pressures.  CBC reveals hemoglobin of 6.2, down from 12.0 in September 2023.  High-sensitivity troponin I initially 37, with repeat value up to 128.  EKG shows no evidence of acute ischemic changes.  DRE with fecal occult blood test is pending.  EDP has a call out to on-call cardiology further additional recommendations.   I have placed an order for further evaluation management of the above.  At this time, patient appears to present with multiple days of intermittent chest discomfort, in the setting of suspected type II supply/demand mismatch as a consequence of acute versus subacute anemia.  Orders have been placed for transfusion of 2 units PRBC.  I have placed some additional preliminary admit orders via the adult multi-morbid admission order set. I have also ordered repeat troponin in the morning, echocardiogram.  Also ordered morning labs in the form of CMP, CBC, and serum magnesium level.    Newton Pigg, DO Hospitalist

## 2022-11-23 NOTE — Progress Notes (Signed)
  Echocardiogram 2D Echocardiogram has been performed.  Delcie Roch 11/23/2022, 4:47 PM

## 2022-11-23 NOTE — ED Provider Notes (Signed)
[facility]-EM[facility]ENC[facility] DEPT [facility] Emergency Department Provider Note MRN:  [id number]  Arrival date & time: 11/23/22     Chief Complaint   Chest Pain   History of Present Illness   Kara Bush is a 82 y.o. year-old female with a history of CAD, AAA presenting to the ED with chief complaint of chest pain.  Pain in the chest and upper abdomen for the past few days intermittently, mostly mild.  Much worse this evening associated with some shortness of breath.  Denies lower abdominal pain, no blood in the stool or black stool.  Review of Systems  A thorough review of systems was obtained and all systems are negative except as noted in the HPI and PMH.   Patient's Health History    Past Medical History:  Diagnosis Date   Abdominal aortic aneurysm (HCC)    Anemia, improved 01/11/2013   Basal cell carcinoma    CAD (coronary artery disease)    hx CABG 1998, last cath 2007 with PCI and stenting to the proximal segment of the vein graft to the diagonal branch. DES Taxus was placed   Cervicalgia    Chest wall pain    Chronic fatigue fibromyalgia syndrome    Chronic pain syndrome    Coronary artery disease    Depression    Fibromyalgia    Fibromyalgia    History of nuclear stress test 08/07/2011   lexiscan; no evidence of ischemia or infarct; low risk    Hyperlipidemia    Hypertension    Hypertension    Obstructive sleep apnea    OSA (obstructive sleep apnea)    PAD (peripheral artery disease) (HCC)    Pes anserine bursitis    Pulmonary embolism (HCC) 01/11/2013   S/P resection of aortic aneurysm    Trochanteric bursitis    Ulcerative colitis (HCC)     Past Surgical History:  Procedure Laterality Date   AORTOBIEXTERNAL ILIAC BYPASS  1991   CATARACT EXTRACTION Left 09/2019   CHOLECYSTECTOMY  1997   CORONARY ANGIOPLASTY WITH STENT PLACEMENT  2006   to LAD, ATRETIC LIMA AT THAT TIME ALSO   CORONARY ANGIOPLASTY WITH STENT PLACEMENT  2007   STENT TO PROX VG-DIAG, OTHER  STENTS PATENT   CORONARY ARTERY BYPASS GRAFT  1998   LIMA-LAD;VG-DIAG & RCA   CORONARY STENT PLACEMENT  2005   TO LAD & LCX-STENTS,   HERNIA REPAIR     TUBAL LIGATION     BTL    Family History  Problem Relation Age of Onset   Heart disease Father    Heart attack Father        @ 5   Cancer Paternal Aunt        BREAST   Heart failure Mother        at 63   Healthy Sister    Cancer Brother        pancreatic    Heart failure Maternal Grandmother    Heart attack Maternal Grandfather    Heart attack Paternal Grandmother        at 36   Healthy Daughter    Healthy Daughter     Social History   Socioeconomic History   Marital status: Divorced    Spouse name: Not on file   Number of children: 2   Years of education: college    Highest education level: Not on file  Occupational History    Employer: RETIRED  Tobacco Use   Smoking status: Former  Packs/day: 2.50    Years: 20.00    Additional pack years: 0.00    Total pack years: 50.00    Types: Cigarettes    Quit date: 06/29/1990    Years since quitting: 32.4    Passive exposure: Never   Smokeless tobacco: Never  Vaping Use   Vaping Use: Never used  Substance and Sexual Activity   Alcohol use: No   Drug use: No   Sexual activity: Not on file  Other Topics Concern   Not on file  Social History Narrative   Not on file   Social Determinants of Health   Financial Resource Strain: Not on file  Food Insecurity: Not on file  Transportation Needs: Not on file  Physical Activity: Not on file  Stress: Not on file  Social Connections: Not on file  Intimate Partner Violence: Not on file     Physical Exam   Vitals:   11/23/22 0030 11/23/22 0045  BP: 136/60 133/64  Pulse: 83 83  Resp: 16 19  Temp:  98.1 F (36.7 C)  SpO2: 98% 99%    CONSTITUTIONAL: Well-appearing, NAD NEURO/PSYCH:  Alert and oriented x 3, no focal deficits EYES:  eyes equal and reactive ENT/NECK:  no LAD, no JVD CARDIO: Regular rate,  well-perfused, normal S1 and S2 PULM:  CTAB no wheezing or rhonchi GI/GU:  non-distended, non-tender MSK/SPINE:  No gross deformities, no edema SKIN:  no rash, atraumatic   *Additional and/or pertinent findings included in MDM below  Diagnostic and Interventional Summary    EKG Interpretation  Date/Time:  Saturday November 22 2022 21:56:20 EDT Ventricular Rate:  103 PR Interval:  194 QRS Duration: 80 QT Interval:  334 QTC Calculation: 437 R Axis:   39 Text Interpretation: Sinus tachycardia with Fusion complexes ST & T wave abnormality, consider inferolateral ischemia Abnormal ECG When compared with ECG of 26-Feb-2021 11:07, PREVIOUS ECG IS PRESENT Confirmed by Kennis Carina [phone]) on 11/23/2022 1:23:30 AM       Labs Reviewed  BASIC METABOLIC PANEL - Abnormal; Notable for the following components:      Result Value   Glucose, Bld 132 (*)    Creatinine, Ser 1.37 (*)    GFR, Estimated 38 (*)    All other components within normal limits  CBC - Abnormal; Notable for the following components:   RBC 3.35 (*)    Hemoglobin 6.2 (*)    HCT 21.7 (*)    MCV 64.8 (*)    MCH 18.5 (*)    MCHC 28.6 (*)    RDW 16.5 (*)    All other components within normal limits  HEPATIC FUNCTION PANEL - Abnormal; Notable for the following components:   Total Protein 6.0 (*)    Albumin 3.4 (*)    Alkaline Phosphatase 32 (*)    Total Bilirubin <0.1 (*)    All other components within normal limits  TROPONIN I (HIGH SENSITIVITY) - Abnormal; Notable for the following components:   Troponin I (High Sensitivity) 37 (*)    All other components within normal limits  LIPASE, BLOOD  TYPE AND SCREEN  ABO/RH  PREPARE RBC (CROSSMATCH)  TROPONIN I (HIGH SENSITIVITY)    DG Chest 2 View  Final Result      Medications  0.9 %  sodium chloride infusion (Manually program via Guardrails IV Fluids) (0 mLs Intravenous Hold 11/23/22 0038)     Procedures  /  Critical Care .Critical Care  Performed by: Sabas Sous, MD Authorized by:  Sabas Sous, MD   Critical care provider statement:    Critical care time (minutes):  30   Critical care was time spent personally by me on the following activities:  Development of treatment plan with patient or surrogate, discussions with consultants, evaluation of patient's response to treatment, examination of patient, ordering and review of laboratory studies, ordering and review of radiographic studies, ordering and performing treatments and interventions, pulse oximetry, re-evaluation of patient's condition and review of old charts (Anemia requiring blood transfusion)   ED Course and Medical Decision Making  Initial Impression and Ddx Chest pain at rest, pressure type pain with shortness of breath.  Resolved at this time.  Concerning for possible cardiac pain given patient's extensive history.  Appears pale.  Denies any bleeding.  Vital signs reassuring.  Labs reveal significant drop in hemoglobin compared to baseline, suspect chronic blood loss of some kind or anemia of chronic disease.  Abdomen is soft and nontender.  Past medical/surgical history that increases complexity of ED encounter: CAD  Interpretation of Diagnostics I personally reviewed the EKG and my interpretation is as follows: ST depressions similar to prior, sinus rhythm  Hemoglobin of 6.2, mildly elevated troponin.  Patient Reassessment and Ultimate Disposition/Management     Suspect demand ischemia given the anemia, transfusing blood, will admit to medicine.  Patient management required discussion with the following services or consulting groups:  Hospitalist Service  Complexity of Problems Addressed Acute illness or injury that poses threat of life of bodily function  Additional Data Reviewed and Analyzed Further history obtained from: Further history from spouse/family member  Additional Factors Impacting ED Encounter Risk Consideration of hospitalization  Elmer Sow. Pilar Plate,  MD [facility] Emergency Medicine [facility] [email or web address]  Final Clinical Impressions(s) / ED Diagnoses     ICD-10-CM   1. Symptomatic anemia  D64.9     2. Chest pain, unspecified type  R07.9       ED Discharge Orders     None        Discharge Instructions Discussed with and Provided to Patient:   Discharge Instructions   None      Sabas Sous, MD 11/23/22 [phone]

## 2022-11-24 DIAGNOSIS — K922 Gastrointestinal hemorrhage, unspecified: Secondary | ICD-10-CM | POA: Diagnosis not present

## 2022-11-24 DIAGNOSIS — I480 Paroxysmal atrial fibrillation: Secondary | ICD-10-CM | POA: Diagnosis not present

## 2022-11-24 DIAGNOSIS — I2489 Other forms of acute ischemic heart disease: Secondary | ICD-10-CM

## 2022-11-24 LAB — BASIC METABOLIC PANEL
Anion gap: 12 (ref 5–15)
BUN: 13 mg/dL (ref 8–23)
CO2: 21 mmol/L — ABNORMAL LOW (ref 22–32)
Calcium: 8.6 mg/dL — ABNORMAL LOW (ref 8.9–10.3)
Chloride: 105 mmol/L (ref 98–111)
Creatinine, Ser: 1.29 mg/dL — ABNORMAL HIGH (ref 0.44–1.00)
GFR, Estimated: 41 mL/min — ABNORMAL LOW (ref >60)
Glucose, Bld: 104 mg/dL — ABNORMAL HIGH (ref 70–99)
Potassium: 4 mmol/L (ref 3.5–5.1)
Sodium: 138 mmol/L (ref 135–145)

## 2022-11-24 LAB — CBC
HCT: 25.4 % — ABNORMAL LOW (ref 36.0–46.0)
Hemoglobin: 7.4 g/dL — ABNORMAL LOW (ref 12.0–15.0)
MCH: 20 pg — ABNORMAL LOW (ref 26.0–34.0)
MCHC: 29.1 g/dL — ABNORMAL LOW (ref 30.0–36.0)
MCV: 68.6 fL — ABNORMAL LOW (ref 80.0–100.0)
Platelets: 171 10*3/uL (ref 150–400)
RBC: 3.7 MIL/uL — ABNORMAL LOW (ref 3.87–5.11)
RDW: 18.7 % — ABNORMAL HIGH (ref 11.5–15.5)
WBC: 8.9 10*3/uL (ref 4.0–10.5)
nRBC: 0.2 % (ref 0.0–0.2)

## 2022-11-24 LAB — IRON AND TIBC
Iron: 73 ug/dL (ref 28–170)
Saturation Ratios: 15 % (ref 10.4–31.8)
TIBC: 479 ug/dL — ABNORMAL HIGH (ref 250–450)
UIBC: 406 ug/dL

## 2022-11-24 LAB — TYPE AND SCREEN

## 2022-11-24 LAB — RETICULOCYTES
Immature Retic Fract: 37.2 % — ABNORMAL HIGH (ref 2.3–15.9)
RBC.: 3.63 MIL/uL — ABNORMAL LOW (ref 3.87–5.11)
Retic Count, Absolute: 64.3 10*3/uL (ref 19.0–186.0)
Retic Ct Pct: 1.8 % (ref 0.4–3.1)

## 2022-11-24 LAB — BPAM RBC
Blood Product Expiration Date: 202404252359
ISSUE DATE / TIME: 202404221214
Unit Type and Rh: 7300

## 2022-11-24 LAB — FERRITIN: Ferritin: 7 ng/mL — ABNORMAL LOW (ref 11–307)

## 2022-11-24 LAB — FOLATE: Folate: >40 ng/mL (ref >5.9)

## 2022-11-24 LAB — HEMOGLOBIN AND HEMATOCRIT, BLOOD
HCT: 29.9 % — ABNORMAL LOW (ref 36.0–46.0)
Hemoglobin: 8.9 g/dL — ABNORMAL LOW (ref 12.0–15.0)

## 2022-11-24 LAB — PREPARE RBC (CROSSMATCH)

## 2022-11-24 LAB — VITAMIN B12: Vitamin B-12: 381 pg/mL (ref 180–914)

## 2022-11-24 LAB — OCCULT BLOOD, POC DEVICE: Fecal Occult Bld: POSITIVE — AB

## 2022-11-24 MED ORDER — MAGNESIUM SULFATE 2 GM/50ML IV SOLN
2.0000 g | Freq: Once | INTRAVENOUS | Status: AC
  Administered 2022-11-24: 2 g via INTRAVENOUS
  Filled 2022-11-24: qty 50

## 2022-11-24 MED ORDER — PEG 3350-KCL-NA BICARB-NACL 420 G PO SOLR
2000.0000 mL | Freq: Once | ORAL | Status: AC
  Administered 2022-11-24: 2000 mL via ORAL
  Filled 2022-11-24: qty 4000

## 2022-11-24 MED ORDER — ONDANSETRON HCL 4 MG/2ML IJ SOLN
4.0000 mg | Freq: Four times a day (QID) | INTRAMUSCULAR | Status: DC | PRN
  Administered 2022-11-24 – 2022-11-25 (×2): 4 mg via INTRAVENOUS
  Filled 2022-11-24 (×2): qty 2

## 2022-11-24 MED ORDER — HYDROCODONE-ACETAMINOPHEN 5-325 MG PO TABS
1.0000 | ORAL_TABLET | ORAL | Status: DC | PRN
  Administered 2022-11-24 – 2022-11-28 (×20): 1.5 via ORAL
  Filled 2022-11-24 (×20): qty 2

## 2022-11-24 MED ORDER — PHENOL 1.4 % MT LIQD
1.0000 | OROMUCOSAL | Status: DC | PRN
  Administered 2022-11-24: 1 via OROMUCOSAL
  Filled 2022-11-24: qty 177

## 2022-11-24 MED ORDER — PEG 3350-KCL-NA BICARB-NACL 420 G PO SOLR
2000.0000 mL | Freq: Once | ORAL | Status: AC
  Administered 2022-11-25: 2000 mL via ORAL

## 2022-11-24 MED ORDER — PEG 3350-KCL-NA BICARB-NACL 420 G PO SOLR: 4000.0000 mL | Freq: Once | ORAL | Status: DC

## 2022-11-24 MED ORDER — SODIUM CHLORIDE 0.9% IV SOLUTION: Freq: Once | INTRAVENOUS | Status: AC

## 2022-11-24 NOTE — Progress Notes (Signed)
[facility]                                                                               Kara Bush, is a 82 y.o. female, DOB - Jun 18, 1941, ZOX:[id number] Admit date - 11/22/2022    Outpatient Primary MD for the patient is Jackelyn Poling, DO  LOS - 1  days    Brief summary   Kara Bush is a 82 y.o. female with medical history significant of AAA, CAD s/p CABG, chronic fatigue/chronic pain/fibromyalgia, HTN, HLD, OSA, and UC presenting with CP/SOB.   She had been feeling bad and then started having bad chest pain/pressure.    Assessment & Plan    Assessment and Plan:  Acute blood loss anemia / melena Hemoglobin around 6 on presentation.  2 units of prbc transfusion ordered and repeat hemoglobin at 7.4 .  1 unit of prbc transfusion ordered this am. Anemia panel ordered.  Last colonoscopy was on 05/2019 GI consulted, and plan for EGD this morning.  Continue with IV protonix BID.  Holding eliquis for now.    NSTEMI  In the setting of CAD s/p CABG and PCI in the past.  Elevated troponins from demand ischemia from anemia  Cardiology on board.  Echocardiogram ordered and pending.    PAF;  Rate controlled with Toprol XL.   Stage 3 B CKD Creatinine appears to be at baseline.    Hypertension:  Well controlled.    OSA ON CPAP at home.    Hyperlipidemia Resume statin.    Chronic pain syndrome / fibromyalgia:  Resume home meds.    Estimated body mass index is 34.06 kg/m as calculated from the following:   Height as of this encounter: 5\' 4"  (1.626 m).   Weight as of this encounter: 90 kg.  Code Status:  DVT Prophylaxis:  SCDs Start: 11/23/22 0956   Level of Care: Level of care: Progressive Family Communication: family at bedside.   Disposition Plan:     Remains inpatient appropriate:  pending further eval.   Procedures:  Echo EGD.   Consultants:   Cardiology Gastroenterology.   Antimicrobials:   Anti-infectives (From  admission, onward)    None        Medications  Scheduled Meds:  sodium chloride   Intravenous Once   amLODipine  5 mg Oral Daily   atorvastatin  20 mg Oral Daily   citalopram  20 mg Oral Daily   metoprolol succinate  50 mg Oral Daily   [START ON 11/26/2022] pantoprazole  40 mg Intravenous Q12H   sodium chloride flush  3 mL Intravenous Q12H   Continuous Infusions:  lactated ringers 100 mL/hr at 11/24/22 0753   magnesium sulfate bolus IVPB     pantoprazole 8 mg/hr (11/24/22 0535)   PRN Meds:.acetaminophen **OR** acetaminophen, hydrALAZINE, HYDROcodone-acetaminophen, melatonin, methocarbamol, morphine injection, ondansetron (ZOFRAN) IV, phenol, zolpidem    Subjective:   Kara Bush was seen and examined today.  No chest pain or BM since yesterday.  On 2lit of [place] oxygen.   Objective:   Vitals:   11/23/22 2100 11/24/22 0002 11/24/22 0350 11/24/22 0806  BP:  (!) 126/45 135/61 (!) 147/53  Pulse:  85 80 85  Resp:  19 18 18   Temp:  98.5 F (36.9 C) 98.6 F (37 C) 98.3 F (36.8 C)  TempSrc:  Oral Oral Oral  SpO2: (!) 80% 97% 98% 90%  Weight:      Height:        Intake/Output Summary (Last 24 hours) at 11/24/2022 0906 Last data filed at 11/24/2022 2130 Gross per 24 hour  Intake 2425.09 ml  Output 200 ml  Net 2225.09 ml   Filed Weights   11/23/22 0325  Weight: 90 kg     Exam General: Alert and oriented x 3, NAD Cardiovascular: S1 S2 auscultated, no murmurs, RRR Respiratory: Clear to auscultation bilaterally, no wheezing, rales or rhonchi Gastrointestinal: Soft, nontender, nondistended, + bowel sounds Ext: no pedal edema bilaterally Neuro: AAOx3, Cr N's II- XII. Strength 5/5 upper and lower extremities bilaterally Skin: No rashes Psych: Normal affect and demeanor, alert and oriented x3    Data Reviewed:  I have personally reviewed following labs and imaging studies   CBC Lab Results  Component Value Date   WBC 8.9 11/24/2022   RBC 3.70 (L)  11/24/2022   HGB 7.4 (L) 11/24/2022   HCT 25.4 (L) 11/24/2022   MCV 68.6 (L) 11/24/2022   MCH 20.0 (L) 11/24/2022   PLT 171 11/24/2022   MCHC 29.1 (L) 11/24/2022   RDW 18.7 (H) 11/24/2022   LYMPHSABS 1.7 11/23/2022   MONOABS 1.1 (H) 11/23/2022   EOSABS 0.1 11/23/2022   BASOSABS 0.1 11/23/2022     Last metabolic panel Lab Results  Component Value Date   NA 138 11/24/2022   K 4.0 11/24/2022   CL 105 11/24/2022   CO2 21 (L) 11/24/2022   BUN 13 11/24/2022   CREATININE 1.29 (H) 11/24/2022   GLUCOSE 104 (H) 11/24/2022   GFRNONAA 41 (L) 11/24/2022   GFRAA 73 10/13/2019   CALCIUM 8.6 (L) 11/24/2022   PROT 5.7 (L) 11/23/2022   ALBUMIN 3.3 (L) 11/23/2022   LABGLOB 2.1 10/13/2019   AGRATIO 2.1 10/13/2019   BILITOT 1.1 11/23/2022   ALKPHOS 29 (L) 11/23/2022   AST 21 11/23/2022   ALT 13 11/23/2022   ANIONGAP 12 11/24/2022    CBG (last 3)  No results for input(s): "GLUCAP" in the last 72 hours.    Coagulation Profile: No results for input(s): "INR", "PROTIME" in the last 168 hours.   Radiology Studies: ECHOCARDIOGRAM COMPLETE  Result Date: 11/23/2022    ECHOCARDIOGRAM REPORT   Patient Name:   Kara Bush Date of Exam: 11/23/2022 Medical Rec #:  [id number]          Height:       64.0 in Accession #:    [id number]         Weight:       198.4 lb Date of Birth:  [birth year]         BSA:          1.950 m Patient Age:    81 years           BP:           146/56 mmHg Patient Gender: F                  HR:           88 bpm. Exam Location:  Inpatient Procedure: 2D Echo, Cardiac Doppler and Color Doppler STAT ECHO Indications:    elevated troponin  History:        Patient has  prior history of Echocardiogram examinations, most                 recent 08/27/2022. Prior CABG and resection of aortic aneurysm,                 PAD, Arrythmias:Atrial Fibrillation, Signs/Symptoms:Chest Pain                 and Shortness of Breath; Risk Factors:Hypertension, Dyslipidemia                 and Sleep  Apnea.  Sonographer:    Delcie Roch RDCS Referring Phys: [id number] JENNIFER YATES IMPRESSIONS  1. Left ventricular ejection fraction, by estimation, is 60%. The left ventricle has normal function. The left ventricle has no regional wall motion abnormalities. There is mild left ventricular hypertrophy. Left ventricular diastolic parameters are indeterminate.  2. Right ventricular systolic function is normal. The right ventricular size is mildly enlarged. There is severely elevated pulmonary artery systolic pressure. The estimated right ventricular systolic pressure is 61.9 mmHg.  3. The mitral valve is grossly normal. Mild mitral valve regurgitation.  4. Tricuspid valve regurgitation is moderate to severe.  5. Fusion of right and left coronary cusps with raphe present.. 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 There is moderate calcification of the aortic valve. There is mild thickening of the aortic valve. Aortic valve regurgitation is mild. Mild aortic valve stenosis. Aortic valve mean gradient measures 15.0 mmHg. Aortic valve Vmax measures 2.57 m/s. Doppler alignment suboptimal, gradient and LVOT TVI may be underestimated.  6. There is borderline dilatation of the ascending aorta, measuring 40 mm.  7. The inferior vena cava is normal in size with <50% respiratory variability, suggesting right atrial pressure of 8 mmHg. FINDINGS  Left Ventricle: Left ventricular ejection fraction, by estimation, is 60%. The left ventricle has normal function. The left ventricle has no regional wall motion abnormalities. The left ventricular internal cavity size was normal in size. There is mild left ventricular hypertrophy. Left ventricular diastolic parameters are indeterminate. Right Ventricle: The right ventricular size is mildly enlarged. No increase in right ventricular wall thickness. Right ventricular systolic function is normal. There is severely elevated pulmonary artery systolic pressure. The tricuspid regurgitant velocity is 3.67  m/s, and with an assumed right atrial pressure of 8 mmHg, the estimated right ventricular systolic pressure is 61.9 mmHg. Left Atrium: Left atrial size was normal in size. Right Atrium: Right atrial size was normal in size. Pericardium: There is no evidence of pericardial effusion. Mitral Valve: The mitral valve is grossly normal. Mild mitral valve regurgitation. Tricuspid Valve: The tricuspid valve is normal in structure. Tricuspid valve regurgitation is moderate to severe. No evidence of tricuspid stenosis. Aortic Valve: Fusion of right and left coronary cusps with raphe present. 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 There is moderate calcification of the aortic valve. There is mild thickening of the aortic valve. Aortic valve regurgitation is mild. Mild aortic stenosis is present. Aortic valve mean gradient measures 15.0 mmHg. Aortic valve peak gradient measures 26.4 mmHg. Aortic valve area, by VTI measures 0.88 cm. Pulmonic Valve: The pulmonic valve was normal in structure. Pulmonic valve regurgitation is mild. No evidence of pulmonic stenosis. Aorta: The aortic root is normal in size and structure. There is borderline dilatation of the ascending aorta, measuring 40 mm. Venous: The inferior vena cava is normal in size with less than 50% respiratory variability, suggesting right atrial pressure of 8 mmHg. IAS/Shunts: The interatrial septum was not well visualized.  LEFT  VENTRICLE PLAX 2D LVIDd:         4.30 cm   Diastology LVIDs:         2.90 cm   LV e' lateral:   9.68 cm/s LV PW:         1.10 cm   LV E/e' lateral: 10.0 LV IVS:        1.00 cm LVOT diam:     2.10 cm LV SV:         54 LV SV Index:   27 LVOT Area:     3.46 cm  RIGHT VENTRICLE             IVC RV Basal diam:  3.30 cm     IVC diam: 2.00 cm RV S prime:     11.50 cm/s TAPSE (M-mode): 1.6 cm LEFT ATRIUM             Index        RIGHT ATRIUM           Index LA diam:        4.00 cm 2.05 cm/m   RA Area:     17.10 cm LA Vol (A2C):   62.0 ml 31.80 ml/m  RA  Volume:   41.80 ml  21.44 ml/m LA Vol (A4C):   63.9 ml 32.77 ml/m LA Biplane Vol: 64.4 ml 33.03 ml/m  AORTIC VALVE AV Area (Vmax):    0.95 cm AV Area (Vmean):   0.90 cm AV Area (VTI):     0.88 cm AV Vmax:           257.00 cm/s AV Vmean:          183.000 cm/s AV VTI:            0.611 m AV Peak Grad:      26.4 mmHg AV Mean Grad:      15.0 mmHg LVOT Vmax:         70.45 cm/s LVOT Vmean:        47.700 cm/s LVOT VTI:          0.154 m LVOT/AV VTI ratio: 0.25  AORTA Ao Root diam: 3.30 cm Ao Asc diam:  4.00 cm MITRAL VALVE                TRICUSPID VALVE MV Area (PHT): 5.13 cm     TR Peak grad:   53.9 mmHg MV Decel Time: 148 msec     TR Vmax:        367.00 cm/s MR Peak grad: 85.7 mmHg MR Mean grad: 57.0 mmHg     SHUNTS MR Vmax:      463.00 cm/s   Systemic VTI:  0.15 m MR Vmean:     360.0 cm/s    Systemic Diam: 2.10 cm MV E velocity: 97.00 cm/s MV A velocity: 107.00 cm/s MV E/A ratio:  0.91 Weston Brass MD Electronically signed by Weston Brass MD Signature Date/Time: 11/23/2022/5:40:10 PM    Final    DG Chest 2 View  Result Date: 11/22/2022 CLINICAL DATA:  Chest pain EXAM: CHEST - 2 VIEW COMPARISON:  04/13/2022 FINDINGS: Prior CABG. Heart and mediastinal contours within normal limits. Interstitial prominence throughout the lungs. Suspect trace bilateral effusions. No acute bony abnormality. Aortic atherosclerosis. IMPRESSION: Interstitial prominence within the lungs, slightly basilar predominant. This could reflect chronic lung disease, edema, or atypical infection. Electronically Signed   By: Charlett Nose M.D.   On: 11/22/2022 22:30       Gerald Honea  Blake Divine M.D. [facility] 11/24/2022, 9:06 AM  Available via Epic secure chat 7am-7pm After 7 pm, please refer to night coverage provider listed on amion.

## 2022-11-24 NOTE — Progress Notes (Addendum)
Rounding Note    Patient Name: Kara Bush Date of Encounter: 11/24/2022  [facility] Cardiologist: Chrystie Nose, MD   Subjective   No chest pain- feels achy all over no SOB  Inpatient Medications    Scheduled Meds:  sodium chloride   Intravenous Once   amLODipine  5 mg Oral Daily   atorvastatin  20 mg Oral Daily   citalopram  20 mg Oral Daily   metoprolol succinate  50 mg Oral Daily   [START ON 11/26/2022] pantoprazole  40 mg Intravenous Q12H   sodium chloride flush  3 mL Intravenous Q12H   Continuous Infusions:  lactated ringers 100 mL/hr at 11/24/22 0753   pantoprazole 8 mg/hr (11/24/22 0535)   PRN Meds: acetaminophen **OR** acetaminophen, hydrALAZINE, HYDROcodone-acetaminophen, melatonin, methocarbamol, morphine injection, ondansetron (ZOFRAN) IV, zolpidem   Vital Signs    Vitals:   11/23/22 2100 11/24/22 0002 11/24/22 0350 11/24/22 0806  BP:  (!) 126/45 135/61 (!) 147/53  Pulse:  85 80 85  Resp:  Temp:  98.5 F (36.9 C) 98.6 F (37 C) 98.3 F (36.8 C)  TempSrc:  Oral Oral Oral  SpO2: (!) 80% 97% 98% 90%  Weight:      Height:        Intake/Output Summary (Last 24 hours) at 11/24/2022 0820 Last data filed at 11/24/2022 1610 Gross per 24 hour  Intake 2425.09 ml  Output 200 ml  Net 2225.09 ml      11/23/2022    3:25 AM 10/17/2022    1:36 PM 09/25/2022    1:28 PM  Last 3 Weights  Weight (lbs) 198 lb 6.6 oz 198 lb 6.4 oz 197 lb  Weight (kg) 90 kg 89.994 kg 89.359 kg      Telemetry    SR with PACs rare PVC - Personally Reviewed  ECG    No new  - Personally Reviewed  Physical Exam   GEN: No acute distress.  Color pale Neck: No JVD Cardiac: RRR, no murmurs, rubs, or gallops.  Respiratory: Clear to auscultation bilaterally. GI: Soft, nontender, non-distended  MS: No edema; No deformity. Neuro:  Nonfocal  Psych: Normal affect   Labs    High Sensitivity Troponin:   Recent Labs  Lab 11/22/22 2215  11/23/22 0000 11/23/22 1428  TROPONINIHS 37* 128* 492*     Chemistry Recent Labs  Lab 11/22/22 2215 11/23/22 0000 11/23/22 0552 11/24/22 0225  NA 141  --  138 138  K 4.3  --  4.4 4.0  CL 108  --  109 105  CO2 22  --  23 21*  GLUCOSE 132*  --  114* 104*  BUN 23  --  19 13  CREATININE 1.37*  --  1.22* 1.29*  CALCIUM 9.1  --  8.6* 8.6*  MG  --   --  1.6*  --   PROT  --  6.0* 5.7*  --   ALBUMIN  --  3.4* 3.3*  --   AST  --  20 21  --   ALT  --  11 13  --   ALKPHOS  --  32* 29*  --   BILITOT  --  <0.1* 1.1  --   GFRNONAA 38*  --  44* 41*  ANIONGAP 11  --  6 12    Lipids No results for input(s): "CHOL", "TRIG", "HDL", "LABVLDL", "LDLCALC", "CHOLHDL" in the last 168 hours.  Hematology Recent Labs  Lab 11/23/22 [phone] 11/23/22  1428 11/24/22 0225  WBC 10.0 9.8 8.9  RBC 3.84* 3.94 3.70*  HGB 7.9* 7.9* 7.4*  HCT 26.4* 26.3* 25.4*  MCV 68.8* 66.8* 68.6*  MCH 20.6* 20.1* 20.0*  MCHC 29.9* 30.0 29.1*  RDW 18.7* 18.4* 18.7*  PLT 198 196 171   Thyroid No results for input(s): "TSH", "FREET4" in the last 168 hours.  BNPNo results for input(s): "BNP", "PROBNP" in the last 168 hours.  DDimer No results for input(s): "DDIMER" in the last 168 hours.   Radiology    ECHOCARDIOGRAM COMPLETE  Result Date: 11/23/2022    ECHOCARDIOGRAM REPORT   Patient Name:   FALON HUESCA Date of Exam: 11/23/2022 Medical Rec #:  [id number]          Height:       64.0 in Accession #:    [id number]         Weight:       198.4 lb Date of Birth:  January 17, 1941         BSA:          1.950 m Patient Age:    81 years           BP:           146/56 mmHg Patient Gender: F                  HR:           88 bpm. Exam Location:  Inpatient Procedure: 2D Echo, Cardiac Doppler and Color Doppler STAT ECHO Indications:    elevated troponin  History:        Patient has prior history of Echocardiogram examinations, most                 recent 08/27/2022. Prior CABG and resection of aortic aneurysm,                 PAD,  Arrythmias:Atrial Fibrillation, Signs/Symptoms:Chest Pain                 and Shortness of Breath; Risk Factors:Hypertension, Dyslipidemia                 and Sleep Apnea.  Sonographer:    Delcie Roch RDCS Referring Phys: [id number] JENNIFER YATES IMPRESSIONS  1. Left ventricular ejection fraction, by estimation, is 60%. The left ventricle has normal function. The left ventricle has no regional wall motion abnormalities. There is mild left ventricular hypertrophy. Left ventricular diastolic parameters are indeterminate.  2. Right ventricular systolic function is normal. The right ventricular size is mildly enlarged. There is severely elevated pulmonary artery systolic pressure. The estimated right ventricular systolic pressure is 61.9 mmHg.  3. The mitral valve is grossly normal. Mild mitral valve regurgitation.  4. Tricuspid valve regurgitation is moderate to severe.  5. Fusion of right and left coronary cusps with raphe present.. 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 There is moderate calcification of the aortic valve. There is mild thickening of the aortic valve. Aortic valve regurgitation is mild. Mild aortic valve stenosis. Aortic valve mean gradient measures 15.0 mmHg. Aortic valve Vmax measures 2.57 m/s. Doppler alignment suboptimal, gradient and LVOT TVI may be underestimated.  6. There is borderline dilatation of the ascending aorta, measuring 40 mm.  7. The inferior vena cava is normal in size with <50% respiratory variability, suggesting right atrial pressure of 8 mmHg. FINDINGS  Left Ventricle: Left ventricular ejection fraction, by estimation, is 60%. The left ventricle has normal function. The left ventricle  has no regional wall motion abnormalities. The left ventricular internal cavity size was normal in size. There is mild left ventricular hypertrophy. Left ventricular diastolic parameters are indeterminate. Right Ventricle: The right ventricular size is mildly enlarged. No increase in right ventricular wall  thickness. Right ventricular systolic function is normal. There is severely elevated pulmonary artery systolic pressure. The tricuspid regurgitant velocity is 3.67 m/s, and with an assumed right atrial pressure of 8 mmHg, the estimated right ventricular systolic pressure is 61.9 mmHg. Left Atrium: Left atrial size was normal in size. Right Atrium: Right atrial size was normal in size. Pericardium: There is no evidence of pericardial effusion. Mitral Valve: The mitral valve is grossly normal. Mild mitral valve regurgitation. Tricuspid Valve: The tricuspid valve is normal in structure. Tricuspid valve regurgitation is moderate to severe. No evidence of tricuspid stenosis. Aortic Valve: Fusion of right and left coronary cusps with raphe present. 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 There is moderate calcification of the aortic valve. There is mild thickening of the aortic valve. Aortic valve regurgitation is mild. Mild aortic stenosis is present. Aortic valve mean gradient measures 15.0 mmHg. Aortic valve peak gradient measures 26.4 mmHg. Aortic valve area, by VTI measures 0.88 cm. Pulmonic Valve: The pulmonic valve was normal in structure. Pulmonic valve regurgitation is mild. No evidence of pulmonic stenosis. Aorta: The aortic root is normal in size and structure. There is borderline dilatation of the ascending aorta, measuring 40 mm. Venous: The inferior vena cava is normal in size with less than 50% respiratory variability, suggesting right atrial pressure of 8 mmHg. IAS/Shunts: The interatrial septum was not well visualized.  LEFT VENTRICLE PLAX 2D LVIDd:         4.30 cm   Diastology LVIDs:         2.90 cm   LV e' lateral:   9.68 cm/s LV PW:         1.10 cm   LV E/e' lateral: 10.0 LV IVS:        1.00 cm LVOT diam:     2.10 cm LV SV:         54 LV SV Index:   27 LVOT Area:     3.46 cm  RIGHT VENTRICLE             IVC RV Basal diam:  3.30 cm     IVC diam: 2.00 cm RV S prime:     11.50 cm/s TAPSE (M-mode): 1.6 cm LEFT  ATRIUM             Index        RIGHT ATRIUM           Index LA diam:        4.00 cm 2.05 cm/m   RA Area:     17.10 cm LA Vol (A2C):   62.0 ml 31.80 ml/m  RA Volume:   41.80 ml  21.44 ml/m LA Vol (A4C):   63.9 ml 32.77 ml/m LA Biplane Vol: 64.4 ml 33.03 ml/m  AORTIC VALVE AV Area (Vmax):    0.95 cm AV Area (Vmean):   0.90 cm AV Area (VTI):     0.88 cm AV Vmax:           257.00 cm/s AV Vmean:          183.000 cm/s AV VTI:            0.611 m AV Peak Grad:      26.4 mmHg AV Mean Grad:  15.0 mmHg LVOT Vmax:         70.45 cm/s LVOT Vmean:        47.700 cm/s LVOT VTI:          0.154 m LVOT/AV VTI ratio: 0.25  AORTA Ao Root diam: 3.30 cm Ao Asc diam:  4.00 cm MITRAL VALVE                TRICUSPID VALVE MV Area (PHT): 5.13 cm     TR Peak grad:   53.9 mmHg MV Decel Time: 148 msec     TR Vmax:        367.00 cm/s MR Peak grad: 85.7 mmHg MR Mean grad: 57.0 mmHg     SHUNTS MR Vmax:      463.00 cm/s   Systemic VTI:  0.15 m MR Vmean:     360.0 cm/s    Systemic Diam: 2.10 cm MV E velocity: 97.00 cm/s MV A velocity: 107.00 cm/s MV E/A ratio:  0.91 Weston Brass MD Electronically signed by Weston Brass MD Signature Date/Time: 11/23/2022/5:40:10 PM    Final    DG Chest 2 View  Result Date: 11/22/2022 CLINICAL DATA:  Chest pain EXAM: CHEST - 2 VIEW COMPARISON:  04/13/2022 FINDINGS: Prior CABG. Heart and mediastinal contours within normal limits. Interstitial prominence throughout the lungs. Suspect trace bilateral effusions. No acute bony abnormality. Aortic atherosclerosis. IMPRESSION: Interstitial prominence within the lungs, slightly basilar predominant. This could reflect chronic lung disease, edema, or atypical infection. Electronically Signed   By: Charlett Nose M.D.   On: 11/22/2022 22:30    Cardiac Studies   Echo 11/23/22 IMPRESSIONS     1. Left ventricular ejection fraction, by estimation, is 60%. The left  ventricle has normal function. The left ventricle has no regional wall  motion  abnormalities. There is mild left ventricular hypertrophy. Left  ventricular diastolic parameters are  indeterminate.   2. Right ventricular systolic function is normal. The right ventricular  size is mildly enlarged. There is severely elevated pulmonary artery  systolic pressure. The estimated right ventricular systolic pressure is  61.9 mmHg.   3. The mitral valve is grossly normal. Mild mitral valve regurgitation.   4. Tricuspid valve regurgitation is moderate to severe.   5. Fusion of right and left coronary cusps with raphe present.. The  aortic valve is bicuspid. There is moderate calcification of the aortic  valve. There is mild thickening of the aortic valve. Aortic valve  regurgitation is mild. Mild aortic valve  stenosis. Aortic valve mean gradient measures 15.0 mmHg. Aortic valve Vmax  measures 2.57 m/s. Doppler alignment suboptimal, gradient and LVOT TVI may  be underestimated.   6. There is borderline dilatation of the ascending aorta, measuring 40  mm.   7. The inferior vena cava is normal in size with <50% respiratory  variability, suggesting right atrial pressure of 8 mmHg.   FINDINGS   Left Ventricle: Left ventricular ejection fraction, by estimation, is  60%. The left ventricle has normal function. The left ventricle has no  regional wall motion abnormalities. The left ventricular internal cavity  size was normal in size. There is mild  left ventricular hypertrophy. Left ventricular diastolic parameters are  indeterminate.   Right Ventricle: The right ventricular size is mildly enlarged. No  increase in right ventricular wall thickness. Right ventricular systolic  function is normal. There is severely elevated pulmonary artery systolic  pressure. The tricuspid regurgitant  velocity is 3.67 m/s, and with an assumed right atrial  pressure of 8 mmHg,  the estimated right ventricular systolic pressure is 61.9 mmHg.   Left Atrium: Left atrial size was normal in size.    Right Atrium: Right atrial size was normal in size.   Pericardium: There is no evidence of pericardial effusion.   Mitral Valve: The mitral valve is grossly normal. Mild mitral valve  regurgitation.   Tricuspid Valve: The tricuspid valve is normal in structure. Tricuspid  valve regurgitation is moderate to severe. No evidence of tricuspid  stenosis.   Aortic Valve: Fusion of right and left coronary cusps with raphe present.  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 There is moderate calcification of the  aortic valve. There is mild thickening of the aortic valve. Aortic valve  regurgitation is mild. Mild aortic  stenosis is present. Aortic valve mean gradient measures 15.0 mmHg. Aortic  valve peak gradient measures 26.4 mmHg. Aortic valve area, by VTI measures  0.88 cm.   Pulmonic Valve: The pulmonic valve was normal in structure. Pulmonic valve  regurgitation is mild. No evidence of pulmonic stenosis.   Aorta: The aortic root is normal in size and structure. There is  borderline dilatation of the ascending aorta, measuring 40 mm.   Venous: The inferior vena cava is normal in size with less than 50%  respiratory variability, suggesting right atrial pressure of 8 mmHg.   IAS/Shunts: The interatrial septum was not well visualized.    # Echocardiogram 08/27/22 IMPRESSIONS   1. Left ventricular ejection fraction, by estimation, is 60 to 65%. The  left ventricle has normal function. The left ventricle has no regional  wall motion abnormalities. There is mild concentric left ventricular  hypertrophy. Left ventricular diastolic  parameters are consistent with Grade I diastolic dysfunction (impaired  relaxation).   2. Right ventricular systolic function is normal. The right ventricular  size is normal.   3. Left atrial size was mildly dilated.   4. The mitral valve is normal in structure. Mild to moderate mitral valve  regurgitation. No evidence of mitral stenosis.   5. Tricuspid valve  regurgitation is severe.   6. 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 Aortic valve regurgitation is mild to  moderate. Mild to moderate aortic valve stenosis. Aortic regurgitation PHT  measures 351 msec. Aortic valve area, by VTI measures 1.15 cm. Aortic  valve mean gradient measures 16.8  mmHg. Aortic valve Vmax measures 2.69 m/s.   7. The inferior vena cava is normal in size with greater than 50%  respiratory variability, suggesting right atrial pressure of 3 mmHg.     Patient Profile     82 y.o. 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   Assessment & Plan    Chest pain in association with acute anemia --chest pain resolved with NTG --hs troponin pk 492 demand ischemia with acute anemia  --EKG with inf lat ST depression, the lateral depression is new. --acute anemia with Hgb 6.2 on admit now transfused 2 units PRBC and Hgb 7.4 today  --Echo with EF 60% no RWMA  Acute GI bleed with Hgb on admit 6.2  --eliquis held --transfused 2 unit PRBCs --plan for EGD today with Dr. Loreta Ave  CAD with CABG in 1998 and last cath 2007 with LIMA  atretic to LAD, VG to diag patent with 60% stenosis prox. LCX stent patent, VG to RCA patent  --cardiopulmonary exercise test normal functional capacity in 2016 Suspect the patient's perceived exercise intolerance may be due to her obesity and related mild restrictive lung  physiology as well as her exertional leg and hip pain. There is no clear cardiac limitation to exercise observed.  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  Eliquis is on hold  Hx PAD with aorta ileal bypass with decreased blood flow to Left renal artery.  HTN  --today 147/53 up slightly      For questions or updates, please contact [facility] Please consult [email or web address] for contact info under        Signed, Nada Boozer, NP  11/24/2022, 8:20 AM     Patient seen and examined.  Agree with above documentation.  On exam, patient is alert and oriented, regular rate and rhythm, 2/6 systolic murmur, lungs CTAB, trace LE edema.  Echo yesterday showed normal systolic function.  Suspect troponin elevation represents demand ischemia in setting of acute blood loss anemia.  Holding Eliquis.  EGD planned for tomorrow  Little Ishikawa, MD

## 2022-11-24 NOTE — Plan of Care (Signed)

## 2022-11-24 NOTE — Progress Notes (Signed)
Subjective: Patient is a 82 year old white female with multiple medical problems who presented to emergency room with chest pain that resolved with nitroglycerin.  She also had some bloody diarrhea and was noted to have microcytic anemia with MCV of 64.8.  Troponins were elevated from demand ischemia.  She was scheduled for an EGD today but we could not get her on the schedule due to lack of availability of anesthesia time and therefore the procedure was postponed till tomorrow.  Patient had denied prepping for colonoscopy patient is felt that the prep caused her to have severe nausea and vomiting the last time she tried to do so. She has had 1 soft bowel movement earlier today.  She denies having any abdominal pain, nausea or vomiting. Her daughter is at the bedside  Objective: Vital signs in last 24 hours: Temp:  [97.7 F (36.5 C)-98.9 F (37.2 C)] 97.7 F (36.5 C) (04/22 1237) Pulse Rate:  [80-85] 81 (04/22 1237) Resp:  [16-19] 18 (04/22 1237) BP: (110-147)/(45-61) 121/55 (04/22 1237) SpO2:  [80 %-98 %] 90 % (04/22 0806) Last BM Date : 11/23/22  Intake/Output from previous day: 04/21 0701 - 04/22 0700 In: 2425.1 [P.O.:240; I.V.:2065.1; IV Piggyback:120] Out: 200 [Urine:200] Intake/Output this shift: No intake/output data recorded.  General appearance: alert, cooperative, appears stated age, no distress, morbidly obese, and pale Resp: clear to auscultation bilaterally Cardio: regular rate and rhythm, S1, S2 normal, no murmur, click, rub or gallop GI: soft, non-tender; bowel sounds normal; no masses,  no organomegaly Extremities: extremities normal, atraumatic, no cyanosis or edema  Lab Results: Recent Labs    11/23/22 0552 11/23/22 1428 11/24/22 0225  WBC 10.0 9.8 8.9  HGB 7.9* 7.9* 7.4*  HCT 26.4* 26.3* 25.4*  PLT 198 196 171   BMET Recent Labs    11/22/22 2215 11/23/22 0552 11/24/22 0225  NA 141 138 138  K 4.3 4.4 4.0  CL 108 109 105  CO2 22 23 21*  GLUCOSE 132*  114* 104*  BUN 23 19 13   CREATININE 1.37* 1.22* 1.29*  CALCIUM 9.1 8.6* 8.6*   LFT Recent Labs    11/23/22 0000 11/23/22 0552  PROT 6.0* 5.7*  ALBUMIN 3.4* 3.3*  AST 20 21  ALT 11 13  ALKPHOS 32* 29*  BILITOT <0.1* 1.1  BILIDIR <0.1  --   IBILI NOT CALCULATED  --    PT/INR No results for input(s): "LABPROT", "INR" in the last 72 hours. Hepatitis Panel No results for input(s): "HEPBSAG", "HCVAB", "HEPAIGM", "HEPBIGM" in the last 72 hours. C-Diff No results for input(s): "CDIFFTOX" in the last 72 hours. No results for input(s): "CDIFFPCR" in the last 72 hours. Fecal Lactopherrin No results for input(s): "FECLLACTOFRN" in the last 72 hours.  Studies/Results: ECHOCARDIOGRAM COMPLETE  Result Date: 11/23/2022    ECHOCARDIOGRAM REPORT   Patient Name:   Kara Bush Date of Exam: 11/23/2022 Medical Rec #:  [id number]          Height:       64.0 in Accession #:    [id number]         Weight:       198.4 lb Date of Birth:  09/06/1940         BSA:          1.950 m Patient Age:    81 years           BP:           146/56 mmHg Patient Gender: F  HR:           88 bpm. Exam Location:  Inpatient Procedure: 2D Echo, Cardiac Doppler and Color Doppler STAT ECHO Indications:    elevated troponin  History:        Patient has prior history of Echocardiogram examinations, most                 recent 08/27/2022. Prior CABG and resection of aortic aneurysm,                 PAD, Arrythmias:Atrial Fibrillation, Signs/Symptoms:Chest Pain                 and Shortness of Breath; Risk Factors:Hypertension, Dyslipidemia                 and Sleep Apnea.  Sonographer:    Delcie Roch RDCS Referring Phys: [id number] JENNIFER YATES IMPRESSIONS  1. Left ventricular ejection fraction, by estimation, is 60%. The left ventricle has normal function. The left ventricle has no regional wall motion abnormalities. There is mild left ventricular hypertrophy. Left ventricular diastolic parameters are  indeterminate.  2. Right ventricular systolic function is normal. The right ventricular size is mildly enlarged. There is severely elevated pulmonary artery systolic pressure. The estimated right ventricular systolic pressure is 61.9 mmHg.  3. The mitral valve is grossly normal. Mild mitral valve regurgitation.  4. Tricuspid valve regurgitation is moderate to severe.  5. Fusion of right and left coronary cusps with raphe present.. 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 There is moderate calcification of the aortic valve. There is mild thickening of the aortic valve. Aortic valve regurgitation is mild. Mild aortic valve stenosis. Aortic valve mean gradient measures 15.0 mmHg. Aortic valve Vmax measures 2.57 m/s. Doppler alignment suboptimal, gradient and LVOT TVI may be underestimated.  6. There is borderline dilatation of the ascending aorta, measuring 40 mm.  7. The inferior vena cava is normal in size with <50% respiratory variability, suggesting right atrial pressure of 8 mmHg. FINDINGS  Left Ventricle: Left ventricular ejection fraction, by estimation, is 60%. The left ventricle has normal function. The left ventricle has no regional wall motion abnormalities. The left ventricular internal cavity size was normal in size. There is mild left ventricular hypertrophy. Left ventricular diastolic parameters are indeterminate. Right Ventricle: The right ventricular size is mildly enlarged. No increase in right ventricular wall thickness. Right ventricular systolic function is normal. There is severely elevated pulmonary artery systolic pressure. The tricuspid regurgitant velocity is 3.67 m/s, and with an assumed right atrial pressure of 8 mmHg, the estimated right ventricular systolic pressure is 61.9 mmHg. Left Atrium: Left atrial size was normal in size. Right Atrium: Right atrial size was normal in size. Pericardium: There is no evidence of pericardial effusion. Mitral Valve: The mitral valve is grossly normal. Mild mitral  valve regurgitation. Tricuspid Valve: The tricuspid valve is normal in structure. Tricuspid valve regurgitation is moderate to severe. No evidence of tricuspid stenosis. Aortic Valve: Fusion of right and left coronary cusps with raphe present. 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 There is moderate calcification of the aortic valve. There is mild thickening of the aortic valve. Aortic valve regurgitation is mild. Mild aortic stenosis is present. Aortic valve mean gradient measures 15.0 mmHg. Aortic valve peak gradient measures 26.4 mmHg. Aortic valve area, by VTI measures 0.88 cm. Pulmonic Valve: The pulmonic valve was normal in structure. Pulmonic valve regurgitation is mild. No evidence of pulmonic stenosis. Aorta: The aortic root is normal in size and  structure. There is borderline dilatation of the ascending aorta, measuring 40 mm. Venous: The inferior vena cava is normal in size with less than 50% respiratory variability, suggesting right atrial pressure of 8 mmHg. IAS/Shunts: The interatrial septum was not well visualized.  LEFT VENTRICLE PLAX 2D LVIDd:         4.30 cm   Diastology LVIDs:         2.90 cm   LV e' lateral:   9.68 cm/s LV PW:         1.10 cm   LV E/e' lateral: 10.0 LV IVS:        1.00 cm LVOT diam:     2.10 cm LV SV:         54 LV SV Index:   27 LVOT Area:     3.46 cm  RIGHT VENTRICLE             IVC RV Basal diam:  3.30 cm     IVC diam: 2.00 cm RV S prime:     11.50 cm/s TAPSE (M-mode): 1.6 cm LEFT ATRIUM             Index        RIGHT ATRIUM           Index LA diam:        4.00 cm 2.05 cm/m   RA Area:     17.10 cm LA Vol (A2C):   62.0 ml 31.80 ml/m  RA Volume:   41.80 ml  21.44 ml/m LA Vol (A4C):   63.9 ml 32.77 ml/m LA Biplane Vol: 64.4 ml 33.03 ml/m  AORTIC VALVE AV Area (Vmax):    0.95 cm AV Area (Vmean):   0.90 cm AV Area (VTI):     0.88 cm AV Vmax:           257.00 cm/s AV Vmean:          183.000 cm/s AV VTI:            0.611 m AV Peak Grad:      26.4 mmHg AV Mean Grad:      15.0 mmHg  LVOT Vmax:         70.45 cm/s LVOT Vmean:        47.700 cm/s LVOT VTI:          0.154 m LVOT/AV VTI ratio: 0.25  AORTA Ao Root diam: 3.30 cm Ao Asc diam:  4.00 cm MITRAL VALVE                TRICUSPID VALVE MV Area (PHT): 5.13 cm     TR Peak grad:   53.9 mmHg MV Decel Time: 148 msec     TR Vmax:        367.00 cm/s MR Peak grad: 85.7 mmHg MR Mean grad: 57.0 mmHg     SHUNTS MR Vmax:      463.00 cm/s   Systemic VTI:  0.15 m MR Vmean:     360.0 cm/s    Systemic Diam: 2.10 cm MV E velocity: 97.00 cm/s MV A velocity: 107.00 cm/s MV E/A ratio:  0.91 Weston Brass MD Electronically signed by Weston Brass MD Signature Date/Time: 11/23/2022/5:40:10 PM    Final    DG Chest 2 View  Result Date: 11/22/2022 CLINICAL DATA:  Chest pain EXAM: CHEST - 2 VIEW COMPARISON:  04/13/2022 FINDINGS: Prior CABG. Heart and mediastinal contours within normal limits. Interstitial prominence throughout the lungs. Suspect trace bilateral effusions. No acute  bony abnormality. Aortic atherosclerosis. IMPRESSION: Interstitial prominence within the lungs, slightly basilar predominant. This could reflect chronic lung disease, edema, or atypical infection. Electronically Signed   By: Charlett Nose M.D.   On: 11/22/2022 22:30    Medications: I have reviewed the patient's current medications. Prior to Admission:  Medications Prior to Admission  Medication Sig Dispense Refill Last Dose   amLODipine (NORVASC) 5 MG tablet TAKE ONE TABLET BY MOUTH ONCE daily 90 tablet 3 11/22/2022   b complex vitamins capsule Take 1 capsule by mouth daily.   11/22/2022   betamethasone dipropionate 0.05 % cream Apply 1 Application topically daily.      citalopram (CELEXA) 20 MG tablet Take 1 tablet by mouth daily.   11/22/2022   clobetasol ointment (TEMOVATE) 0.05 % Apply 1 Application topically 2 (two) times daily.      ELIQUIS 5 MG TABS tablet TAKE ONE TABLET BY MOUTH twice daily 180 tablet 2 11/22/2022   ENTRESTO 97-103 MG TAKE ONE TABLET BY MOUTH TWICE  DAILY 180 tablet 1 11/22/2022   ezetimibe (ZETIA) 10 MG tablet TAKE ONE TABLET BY MOUTH ONCE daily 90 tablet 3 11/22/2022   furosemide (LASIX) 40 MG tablet Take 1 tablet (40 mg total) by mouth 3 (three) times a week. Increase to every day as needed for weight gain of 2lbs overnight or 5lbs in 1 week 90 tablet 1 11/22/2022   HYDROcodone-acetaminophen (NORCO/VICODIN) 5-325 MG tablet Take 1 tablet by mouth 4 (four) times daily as needed for moderate pain. May Take an extra 0.5 to one tablet when pain is severe. 140 tablet 0 11/22/2022   methocarbamol (ROBAXIN) 500 MG tablet TAKE 1 TABLET BY MOUTH EVERY 8 HOURS AS NEEDED FOR MUSCLE SPASM (Patient taking differently: Take 500 mg by mouth every 8 (eight) hours as needed for muscle spasms.) 30 tablet 1 11/22/2022   metoprolol succinate (TOPROL-XL) 50 MG 24 hr tablet TAKE ONE TABLET BY MOUTH ONCE daily (Patient taking differently: Take 50 mg by mouth daily.) 90 tablet 1 11/22/2022   Multiple Vitamin (MULITIVITAMIN WITH MINERALS) TABS Take 1 tablet by mouth daily.   11/22/2022   potassium chloride (KLOR-CON M) 10 MEQ tablet Take 1 tablet (10 mEq total) by mouth 3 (three) times a week. And with extra lasix as needed. 90 tablet 3 11/22/2022   simvastatin (ZOCOR) 40 MG tablet TAKE ONE TABLET BY MOUTH ONCE daily 90 tablet 3 11/22/2022   zolpidem (AMBIEN CR) 12.5 MG CR tablet Take 12.5 mg by mouth at bedtime as needed.   Past Week   ondansetron (ZOFRAN) 4 MG tablet Take 1 tablet by mouth every 6 (six) hours as needed.      Scheduled:  sodium chloride   Intravenous Once   amLODipine  5 mg Oral Daily   atorvastatin  20 mg Oral Daily   citalopram  20 mg Oral Daily   metoprolol succinate  50 mg Oral Daily   [START ON 11/26/2022] pantoprazole  40 mg Intravenous Q12H   sodium chloride flush  3 mL Intravenous Q12H   Continuous:  lactated ringers 100 mL/hr at 11/24/22 0753   pantoprazole 8 mg/hr (11/24/22 0535)   ZOX:[id number] **OR** acetaminophen, hydrALAZINE,  HYDROcodone-acetaminophen, melatonin, methocarbamol, morphine injection, ondansetron (ZOFRAN) IV, phenol, zolpidem  Assessment/Plan: 1) Severe microcytic anemia with blood in stool  a history of ulcerative colitis-patient has agreed to prep for colonoscopy tonight therefore an EGD and colonoscopy will be done tomorrow. 2) NSTEMI-demand ischemia 3) PAF-rate control with Toprol-XL 4) Stage 3 B  CKD. 5) Obstructive sleep apnea. 6) HTN/hyperlipidemia. 7) primary fibromyalgia//chronic pain syndrome.    LOS: 1 day   Charna Elizabeth 11/24/2022, 3:11 PM

## 2022-11-24 NOTE — H&P (View-Only) (Signed)
Subjective: Patient is a 81-year-old white female with multiple medical problems who presented to emergency room with chest pain that resolved with nitroglycerin.  She also had some bloody diarrhea and was noted to have microcytic anemia with MCV of 64.8.  Troponins were elevated from demand ischemia.  She was scheduled for an EGD today but we could not get her on the schedule due to lack of availability of anesthesia time and therefore the procedure was postponed till tomorrow.  Patient had denied prepping for colonoscopy patient is felt that the prep caused her to have severe nausea and vomiting the last time she tried to do so. She has had 1 soft bowel movement earlier today.  She denies having any abdominal pain, nausea or vomiting. Her daughter is at the bedside  Objective: Vital signs in last 24 hours: Temp:  [97.7 F (36.5 C)-98.9 F (37.2 C)] 97.7 F (36.5 C) (04/22 1237) Pulse Rate:  [80-85] 81 (04/22 1237) Resp:  [16-19] 18 (04/22 1237) BP: (110-147)/(45-61) 121/55 (04/22 1237) SpO2:  [80 %-98 %] 90 % (04/22 0806) Last BM Date : 11/23/22  Intake/Output from previous day: 04/21 0701 - 04/22 0700 In: 2425.1 [P.O.:240; I.V.:2065.1; IV Piggyback:120] Out: 200 [Urine:200] Intake/Output this shift: No intake/output data recorded.  General appearance: alert, cooperative, appears stated age, no distress, morbidly obese, and pale Resp: clear to auscultation bilaterally Cardio: regular rate and rhythm, S1, S2 normal, no murmur, click, rub or gallop GI: soft, non-tender; bowel sounds normal; no masses,  no organomegaly Extremities: extremities normal, atraumatic, no cyanosis or edema  Lab Results: Recent Labs    11/23/22 0552 11/23/22 1428 11/24/22 0225  WBC 10.0 9.8 8.9  HGB 7.9* 7.9* 7.4*  HCT 26.4* 26.3* 25.4*  PLT 198 196 171   BMET Recent Labs    11/22/22 2215 11/23/22 0552 11/24/22 0225  NA 141 138 138  K 4.3 4.4 4.0  CL 108 109 105  CO2 22 23 21*  GLUCOSE 132*  114* 104*  BUN 23 19 13  CREATININE 1.37* 1.22* 1.29*  CALCIUM 9.1 8.6* 8.6*   LFT Recent Labs    11/23/22 0000 11/23/22 0552  PROT 6.0* 5.7*  ALBUMIN 3.4* 3.3*  AST 20 21  ALT 11 13  ALKPHOS 32* 29*  BILITOT <0.1* 1.1  BILIDIR <0.1  --   IBILI NOT CALCULATED  --    PT/INR No results for input(s): "LABPROT", "INR" in the last 72 hours. Hepatitis Panel No results for input(s): "HEPBSAG", "HCVAB", "HEPAIGM", "HEPBIGM" in the last 72 hours. C-Diff No results for input(s): "CDIFFTOX" in the last 72 hours. No results for input(s): "CDIFFPCR" in the last 72 hours. Fecal Lactopherrin No results for input(s): "FECLLACTOFRN" in the last 72 hours.  Studies/Results: ECHOCARDIOGRAM COMPLETE  Result Date: 11/23/2022    ECHOCARDIOGRAM REPORT   Patient Name:   Kara Bush Date of Exam: 11/23/2022 Medical Rec #:  [id number]          Height:       64.0 in Accession #:    [id number]         Weight:       198.4 lb Date of Birth:  12/11/1940         BSA:          1.950 m Patient Age:    81 years           BP:           146/56 mmHg Patient Gender: F                    HR:           88 bpm. Exam Location:  Inpatient Procedure: 2D Echo, Cardiac Doppler and Color Doppler STAT ECHO Indications:    elevated troponin  History:        Patient has prior history of Echocardiogram examinations, most                 recent 08/27/2022. Prior CABG and resection of aortic aneurysm,                 PAD, Arrythmias:Atrial Fibrillation, Signs/Symptoms:Chest Pain                 and Shortness of Breath; Risk Factors:Hypertension, Dyslipidemia                 and Sleep Apnea.  Sonographer:    Lauren Pennington RDCS Referring Phys: [id number] JENNIFER YATES IMPRESSIONS  1. Left ventricular ejection fraction, by estimation, is 60%. The left ventricle has normal function. The left ventricle has no regional wall motion abnormalities. There is mild left ventricular hypertrophy. Left ventricular diastolic parameters are  indeterminate.  2. Right ventricular systolic function is normal. The right ventricular size is mildly enlarged. There is severely elevated pulmonary artery systolic pressure. The estimated right ventricular systolic pressure is 61.9 mmHg.  3. The mitral valve is grossly normal. Mild mitral valve regurgitation.  4. Tricuspid valve regurgitation is moderate to severe.  5. Fusion of right and left coronary cusps with raphe present.. 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 There is moderate calcification of the aortic valve. There is mild thickening of the aortic valve. Aortic valve regurgitation is mild. Mild aortic valve stenosis. Aortic valve mean gradient measures 15.0 mmHg. Aortic valve Vmax measures 2.57 m/s. Doppler alignment suboptimal, gradient and LVOT TVI may be underestimated.  6. There is borderline dilatation of the ascending aorta, measuring 40 mm.  7. The inferior vena cava is normal in size with <50% respiratory variability, suggesting right atrial pressure of 8 mmHg. FINDINGS  Left Ventricle: Left ventricular ejection fraction, by estimation, is 60%. The left ventricle has normal function. The left ventricle has no regional wall motion abnormalities. The left ventricular internal cavity size was normal in size. There is mild left ventricular hypertrophy. Left ventricular diastolic parameters are indeterminate. Right Ventricle: The right ventricular size is mildly enlarged. No increase in right ventricular wall thickness. Right ventricular systolic function is normal. There is severely elevated pulmonary artery systolic pressure. The tricuspid regurgitant velocity is 3.67 m/s, and with an assumed right atrial pressure of 8 mmHg, the estimated right ventricular systolic pressure is 61.9 mmHg. Left Atrium: Left atrial size was normal in size. Right Atrium: Right atrial size was normal in size. Pericardium: There is no evidence of pericardial effusion. Mitral Valve: The mitral valve is grossly normal. Mild mitral  valve regurgitation. Tricuspid Valve: The tricuspid valve is normal in structure. Tricuspid valve regurgitation is moderate to severe. No evidence of tricuspid stenosis. Aortic Valve: Fusion of right and left coronary cusps with raphe present. 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 There is moderate calcification of the aortic valve. There is mild thickening of the aortic valve. Aortic valve regurgitation is mild. Mild aortic stenosis is present. Aortic valve mean gradient measures 15.0 mmHg. Aortic valve peak gradient measures 26.4 mmHg. Aortic valve area, by VTI measures 0.88 cm. Pulmonic Valve: The pulmonic valve was normal in structure. Pulmonic valve regurgitation is mild. No evidence of pulmonic stenosis. Aorta: The aortic root is normal in size and   structure. There is borderline dilatation of the ascending aorta, measuring 40 mm. Venous: The inferior vena cava is normal in size with less than 50% respiratory variability, suggesting right atrial pressure of 8 mmHg. IAS/Shunts: The interatrial septum was not well visualized.  LEFT VENTRICLE PLAX 2D LVIDd:         4.30 cm   Diastology LVIDs:         2.90 cm   LV e' lateral:   9.68 cm/s LV PW:         1.10 cm   LV E/e' lateral: 10.0 LV IVS:        1.00 cm LVOT diam:     2.10 cm LV SV:         54 LV SV Index:   27 LVOT Area:     3.46 cm  RIGHT VENTRICLE             IVC RV Basal diam:  3.30 cm     IVC diam: 2.00 cm RV S prime:     11.50 cm/s TAPSE (M-mode): 1.6 cm LEFT ATRIUM             Index        RIGHT ATRIUM           Index LA diam:        4.00 cm 2.05 cm/m   RA Area:     17.10 cm LA Vol (A2C):   62.0 ml 31.80 ml/m  RA Volume:   41.80 ml  21.44 ml/m LA Vol (A4C):   63.9 ml 32.77 ml/m LA Biplane Vol: 64.4 ml 33.03 ml/m  AORTIC VALVE AV Area (Vmax):    0.95 cm AV Area (Vmean):   0.90 cm AV Area (VTI):     0.88 cm AV Vmax:           257.00 cm/s AV Vmean:          183.000 cm/s AV VTI:            0.611 m AV Peak Grad:      26.4 mmHg AV Mean Grad:      15.0 mmHg  LVOT Vmax:         70.45 cm/s LVOT Vmean:        47.700 cm/s LVOT VTI:          0.154 m LVOT/AV VTI ratio: 0.25  AORTA Ao Root diam: 3.30 cm Ao Asc diam:  4.00 cm MITRAL VALVE                TRICUSPID VALVE MV Area (PHT): 5.13 cm     TR Peak grad:   53.9 mmHg MV Decel Time: 148 msec     TR Vmax:        367.00 cm/s MR Peak grad: 85.7 mmHg MR Mean grad: 57.0 mmHg     SHUNTS MR Vmax:      463.00 cm/s   Systemic VTI:  0.15 m MR Vmean:     360.0 cm/s    Systemic Diam: 2.10 cm MV E velocity: 97.00 cm/s MV A velocity: 107.00 cm/s MV E/A ratio:  0.91 Gayatri Acharya MD Electronically signed by Gayatri Acharya MD Signature Date/Time: 11/23/2022/5:40:10 PM    Final    DG Chest 2 View  Result Date: 11/22/2022 CLINICAL DATA:  Chest pain EXAM: CHEST - 2 VIEW COMPARISON:  04/13/2022 FINDINGS: Prior CABG. Heart and mediastinal contours within normal limits. Interstitial prominence throughout the lungs. Suspect trace bilateral effusions. No acute   bony abnormality. Aortic atherosclerosis. IMPRESSION: Interstitial prominence within the lungs, slightly basilar predominant. This could reflect chronic lung disease, edema, or atypical infection. Electronically Signed   By: Kevin  Dover M.D.   On: 11/22/2022 22:30    Medications: I have reviewed the patient's current medications. Prior to Admission:  Medications Prior to Admission  Medication Sig Dispense Refill Last Dose   amLODipine (NORVASC) 5 MG tablet TAKE ONE TABLET BY MOUTH ONCE daily 90 tablet 3 11/22/2022   b complex vitamins capsule Take 1 capsule by mouth daily.   11/22/2022   betamethasone dipropionate 0.05 % cream Apply 1 Application topically daily.      citalopram (CELEXA) 20 MG tablet Take 1 tablet by mouth daily.   11/22/2022   clobetasol ointment (TEMOVATE) 0.05 % Apply 1 Application topically 2 (two) times daily.      ELIQUIS 5 MG TABS tablet TAKE ONE TABLET BY MOUTH twice daily 180 tablet 2 11/22/2022   ENTRESTO 97-103 MG TAKE ONE TABLET BY MOUTH TWICE  DAILY 180 tablet 1 11/22/2022   ezetimibe (ZETIA) 10 MG tablet TAKE ONE TABLET BY MOUTH ONCE daily 90 tablet 3 11/22/2022   furosemide (LASIX) 40 MG tablet Take 1 tablet (40 mg total) by mouth 3 (three) times a week. Increase to every day as needed for weight gain of 2lbs overnight or 5lbs in 1 week 90 tablet 1 11/22/2022   HYDROcodone-acetaminophen (NORCO/VICODIN) 5-325 MG tablet Take 1 tablet by mouth 4 (four) times daily as needed for moderate pain. May Take an extra 0.5 to one tablet when pain is severe. 140 tablet 0 11/22/2022   methocarbamol (ROBAXIN) 500 MG tablet TAKE 1 TABLET BY MOUTH EVERY 8 HOURS AS NEEDED FOR MUSCLE SPASM (Patient taking differently: Take 500 mg by mouth every 8 (eight) hours as needed for muscle spasms.) 30 tablet 1 11/22/2022   metoprolol succinate (TOPROL-XL) 50 MG 24 hr tablet TAKE ONE TABLET BY MOUTH ONCE daily (Patient taking differently: Take 50 mg by mouth daily.) 90 tablet 1 11/22/2022   Multiple Vitamin (MULITIVITAMIN WITH MINERALS) TABS Take 1 tablet by mouth daily.   11/22/2022   potassium chloride (KLOR-CON M) 10 MEQ tablet Take 1 tablet (10 mEq total) by mouth 3 (three) times a week. And with extra lasix as needed. 90 tablet 3 11/22/2022   simvastatin (ZOCOR) 40 MG tablet TAKE ONE TABLET BY MOUTH ONCE daily 90 tablet 3 11/22/2022   zolpidem (AMBIEN CR) 12.5 MG CR tablet Take 12.5 mg by mouth at bedtime as needed.   Past Week   ondansetron (ZOFRAN) 4 MG tablet Take 1 tablet by mouth every 6 (six) hours as needed.      Scheduled:  sodium chloride   Intravenous Once   amLODipine  5 mg Oral Daily   atorvastatin  20 mg Oral Daily   citalopram  20 mg Oral Daily   metoprolol succinate  50 mg Oral Daily   [START ON 11/26/2022] pantoprazole  40 mg Intravenous Q12H   sodium chloride flush  3 mL Intravenous Q12H   Continuous:  lactated ringers 100 mL/hr at 11/24/22 0753   pantoprazole 8 mg/hr (11/24/22 0535)   PRN:acetaminophen **OR** acetaminophen, hydrALAZINE,  HYDROcodone-acetaminophen, melatonin, methocarbamol, morphine injection, ondansetron (ZOFRAN) IV, phenol, zolpidem  Assessment/Plan: 1) Severe microcytic anemia with blood in stool  a history of ulcerative colitis-patient has agreed to prep for colonoscopy tonight therefore an EGD and colonoscopy will be done tomorrow. 2) NSTEMI-demand ischemia 3) PAF-rate control with Toprol-XL 4) Stage 3 B   CKD. 5) Obstructive sleep apnea. 6) HTN/hyperlipidemia. 7) primary fibromyalgia//chronic pain syndrome.    LOS: 1 day   Kanav Kazmierczak 11/24/2022, 3:11 PM   

## 2022-11-24 NOTE — Care Management (Signed)
  Transition of Care [facility]) Screening Note   Patient Details  Name: Kara Bush Date of Birth: 28-Jul-1941   Transition of Care [facility]) CM/SW Contact:    Gala Lewandowsky, RN Phone Number: 11/24/2022, 4:14 PM    Transition of Care Department [facility]) has reviewed the patient and no TOC needs have been identified at this time. Patient presented for chest pain, abdominal pain, and shortness of breath. Per MD notes, Hgb 6 on admission- 2 units PRBC's provided. GI consulted and patient continues on IV Protonix. We will continue to monitor patient advancement through interdisciplinary progression rounds. If new patient transition needs arise, please place a TOC consult.

## 2022-11-25 ENCOUNTER — Encounter (HOSPITAL_COMMUNITY): Payer: Self-pay | Admitting: Anesthesiology

## 2022-11-25 ENCOUNTER — Inpatient Hospital Stay (HOSPITAL_COMMUNITY): Payer: PPO | Admitting: Anesthesiology

## 2022-11-25 ENCOUNTER — Encounter (HOSPITAL_COMMUNITY): Payer: Self-pay | Admitting: Internal Medicine

## 2022-11-25 ENCOUNTER — Encounter (HOSPITAL_COMMUNITY)
Admission: EM | Disposition: A | Discharge status: Home Health | Payer: Self-pay | Source: Home / Self Care | Attending: Family Medicine

## 2022-11-25 DIAGNOSIS — Z87891 Personal history of nicotine dependence: Secondary | ICD-10-CM

## 2022-11-25 DIAGNOSIS — I2489 Other forms of acute ischemic heart disease: Secondary | ICD-10-CM | POA: Diagnosis not present

## 2022-11-25 DIAGNOSIS — K5190 Ulcerative colitis, unspecified, without complications: Secondary | ICD-10-CM

## 2022-11-25 DIAGNOSIS — K449 Diaphragmatic hernia without obstruction or gangrene: Secondary | ICD-10-CM

## 2022-11-25 DIAGNOSIS — D509 Iron deficiency anemia, unspecified: Secondary | ICD-10-CM

## 2022-11-25 DIAGNOSIS — K5520 Angiodysplasia of colon without hemorrhage: Secondary | ICD-10-CM

## 2022-11-25 DIAGNOSIS — I10 Essential (primary) hypertension: Secondary | ICD-10-CM

## 2022-11-25 DIAGNOSIS — K922 Gastrointestinal hemorrhage, unspecified: Secondary | ICD-10-CM | POA: Diagnosis not present

## 2022-11-25 DIAGNOSIS — D649 Anemia, unspecified: Secondary | ICD-10-CM

## 2022-11-25 DIAGNOSIS — N1832 Chronic kidney disease, stage 3b: Secondary | ICD-10-CM

## 2022-11-25 DIAGNOSIS — K5730 Diverticulosis of large intestine without perforation or abscess without bleeding: Secondary | ICD-10-CM

## 2022-11-25 DIAGNOSIS — E785 Hyperlipidemia, unspecified: Secondary | ICD-10-CM

## 2022-11-25 DIAGNOSIS — I2510 Atherosclerotic heart disease of native coronary artery without angina pectoris: Secondary | ICD-10-CM

## 2022-11-25 DIAGNOSIS — G4733 Obstructive sleep apnea (adult) (pediatric): Secondary | ICD-10-CM | POA: Diagnosis not present

## 2022-11-25 HISTORY — PX: COLONOSCOPY WITH PROPOFOL: SHX5780

## 2022-11-25 HISTORY — PX: ESOPHAGOGASTRODUODENOSCOPY (EGD) WITH PROPOFOL: SHX5813

## 2022-11-25 HISTORY — PX: BIOPSY: SHX5522

## 2022-11-25 LAB — CBC WITH DIFFERENTIAL/PLATELET
Abs Immature Granulocytes: 0.04 10*3/uL (ref 0.00–0.07)
Basophils Absolute: 0.1 10*3/uL (ref 0.0–0.1)
Basophils Relative: 1 %
Eosinophils Absolute: 0.2 10*3/uL (ref 0.0–0.5)
Eosinophils Relative: 1 %
HCT: 28.5 % — ABNORMAL LOW (ref 36.0–46.0)
Hemoglobin: 8.6 g/dL — ABNORMAL LOW (ref 12.0–15.0)
Immature Granulocytes: 0 %
Lymphocytes Relative: 18 %
Lymphs Abs: 1.9 10*3/uL (ref 0.7–4.0)
MCH: 20.9 pg — ABNORMAL LOW (ref 26.0–34.0)
MCHC: 30.2 g/dL (ref 30.0–36.0)
MCV: 69.3 fL — ABNORMAL LOW (ref 80.0–100.0)
Monocytes Absolute: 1.2 10*3/uL — ABNORMAL HIGH (ref 0.1–1.0)
Monocytes Relative: 11 %
Neutro Abs: 7.7 10*3/uL (ref 1.7–7.7)
Neutrophils Relative %: 69 %
Platelets: 138 10*3/uL — ABNORMAL LOW (ref 150–400)
RBC: 4.11 MIL/uL (ref 3.87–5.11)
RDW: 20.3 % — ABNORMAL HIGH (ref 11.5–15.5)
WBC: 11.1 10*3/uL — ABNORMAL HIGH (ref 4.0–10.5)
nRBC: 0 % (ref 0.0–0.2)

## 2022-11-25 LAB — BASIC METABOLIC PANEL
Anion gap: 9 (ref 5–15)
BUN: 12 mg/dL (ref 8–23)
CO2: 22 mmol/L (ref 22–32)
Calcium: 8.3 mg/dL — ABNORMAL LOW (ref 8.9–10.3)
Chloride: 107 mmol/L (ref 98–111)
Creatinine, Ser: 1.35 mg/dL — ABNORMAL HIGH (ref 0.44–1.00)
GFR, Estimated: 39 mL/min — ABNORMAL LOW (ref >60)
Glucose, Bld: 106 mg/dL — ABNORMAL HIGH (ref 70–99)
Potassium: 3.8 mmol/L (ref 3.5–5.1)
Sodium: 138 mmol/L (ref 135–145)

## 2022-11-25 LAB — BPAM RBC
Blood Product Expiration Date: 202404252359
Unit Type and Rh: 7300
Unit Type and Rh: 7300

## 2022-11-25 LAB — TYPE AND SCREEN
Unit division: 0
Unit division: 0
Unit division: 0

## 2022-11-25 SURGERY — CANCELLED PROCEDURE

## 2022-11-25 SURGERY — ESOPHAGOGASTRODUODENOSCOPY (EGD) WITH PROPOFOL
Anesthesia: Monitor Anesthesia Care

## 2022-11-25 MED ORDER — PROPOFOL 500 MG/50ML IV EMUL
INTRAVENOUS | Status: DC | PRN
  Administered 2022-11-25: 100 ug/kg/min via INTRAVENOUS

## 2022-11-25 MED ORDER — PROPOFOL 10 MG/ML IV BOLUS: INTRAVENOUS | Status: DC | PRN

## 2022-11-25 MED ORDER — SODIUM CHLORIDE 0.9 % IV SOLN: INTRAVENOUS | Status: DC

## 2022-11-25 MED ORDER — LIDOCAINE 2% (20 MG/ML) 5 ML SYRINGE
INTRAMUSCULAR | Status: DC | PRN
  Administered 2022-11-25: 40 mg via INTRAVENOUS

## 2022-11-25 SURGICAL SUPPLY — 25 items

## 2022-11-25 SURGICAL SUPPLY — 15 items

## 2022-11-25 NOTE — Progress Notes (Signed)
[facility]                                                                               Kara Bush, is a 82 y.o. female, DOB - 1940/10/02, ZOX:[id number] Admit date - 11/22/2022    Outpatient Primary MD for the patient is Jackelyn Poling, DO  LOS - 2  days    Brief summary   Kara Bush is a 82 y.o. female with medical history significant of AAA, CAD s/p CABG, chronic fatigue/chronic pain/fibromyalgia, HTN, HLD, OSA, and UC presenting with CP/SOB.   She had been feeling bad and then started having bad chest pain/pressure. She was also found to have heme positive stools, severe anemia requiring prbc transfusions. GI consulted and she is scheduled for EGD and colonoscopy on 4/23. Cardiology on board.    Assessment & Plan    Assessment and Plan:  Acute blood loss anemia / melena/ microcytic anemia Hemoglobin around 6 on presentation.  S/p 3 units of prbc transfusion, repeat H&H is around 8.6.  Last colonoscopy was on 05/2019. Plan for EGD and colonoscopy today.  Continue with IV protonix BID.  Anemia panel reviewed.  Holding eliquis for now.    NSTEMI  In the setting of CAD s/p CABG and PCI in the past.  Elevated troponins from demand ischemia from anemia  Cardiology on board.  Echocardiogram ordered and pending.    PAF;  Rate controlled with Toprol XL.   Stage 3 B CKD Creatinine appears to be at baseline.  Creatinine at 1.35.    Hypertension:  BP parameters are optimal.    OSA ON CPAP at home.    Hyperlipidemia Resume statin.    Chronic pain syndrome / fibromyalgia:  Resume home meds.  Continue with Celexa, robaxin .   Mild leukocytosis and thrombocytopenia:  Monitor and repeat cbc in am.    Estimated body mass index is 34.06 kg/m as calculated from the following:   Height as of this encounter:  (1.626 m).   Weight as of this encounter: 90 kg.  Code Status:  DVT Prophylaxis:  SCDs Start: 11/23/22 0956   Level of  Care: Level of care: Progressive Family Communication: family at bedside.   Disposition Plan:     Remains inpatient appropriate:  pending further eval.   Procedures:  Echo EGD.   Consultants:   Cardiology Gastroenterology.   Antimicrobials:   Anti-infectives (From admission, onward)    None        Medications  Scheduled Meds:  sodium chloride   Intravenous Once   amLODipine  5 mg Oral Daily   atorvastatin  20 mg Oral Daily   citalopram  20 mg Oral Daily   metoprolol succinate  50 mg Oral Daily   [START ON 11/26/2022] pantoprazole  40 mg Intravenous Q12H   sodium chloride flush  3 mL Intravenous Q12H   Continuous Infusions:  lactated ringers 100 mL/hr at 11/25/22 0900   pantoprazole 8 mg/hr (11/25/22 0900)   PRN Meds:.acetaminophen **OR** acetaminophen, hydrALAZINE, HYDROcodone-acetaminophen, melatonin, methocarbamol, morphine injection, ondansetron (ZOFRAN) IV, phenol, zolpidem    Subjective:   Kara Bush was seen and examined today.  No new  complaints, remains on 2lit of [place] oxygen instead of the CPAP at night.   Objective:   Vitals:   11/25/22 0401 11/25/22 0754 11/25/22 0800 11/25/22 0900  BP: (!) 147/75 (!) 141/63 (!) 141/63   Pulse: 85 80 89 82  Resp: 18 16    Temp: 98 F (36.7 C) 97.8 F (36.6 C)    TempSrc:  Oral    SpO2: 95% 94% 97% 95%  Weight:      Height:        Intake/Output Summary (Last 24 hours) at 11/25/2022 1027 Last data filed at 11/25/2022 0900 Gross per 24 hour  Intake 4254.08 ml  Output 2 ml  Net 4252.08 ml    Filed Weights   11/23/22 0325  Weight: 90 kg     Exam General exam: Appears calm and comfortable  Respiratory system: Clear to auscultation. Respiratory effort normal. Cardiovascular system: S1 & S2 heard, RRR. No JVD, Gastrointestinal system: Abdomen is nondistended, soft and nontender.  Central nervous system: Alert and oriented. No focal neurological deficits. Extremities: Symmetric 5 x 5 power. Skin:  No rashes, lesions or ulcers Psychiatry: Judgement and insight appear normal. Mood & affect appropriate.     Data Reviewed:  I have personally reviewed following labs and imaging studies   CBC Lab Results  Component Value Date   WBC 11.1 (H) 11/25/2022   RBC 4.11 11/25/2022   HGB 8.6 (L) 11/25/2022   HCT 28.5 (L) 11/25/2022   MCV 69.3 (L) 11/25/2022   MCH 20.9 (L) 11/25/2022   PLT 138 (L) 11/25/2022   MCHC 30.2 11/25/2022   RDW 20.3 (H) 11/25/2022   LYMPHSABS 1.9 11/25/2022   MONOABS 1.2 (H) 11/25/2022   EOSABS 0.2 11/25/2022   BASOSABS 0.1 11/25/2022     Last metabolic panel Lab Results  Component Value Date   NA 138 11/25/2022   K 3.8 11/25/2022   CL 107 11/25/2022   CO2 22 11/25/2022   BUN 12 11/25/2022   CREATININE 1.35 (H) 11/25/2022   GLUCOSE 106 (H) 11/25/2022   GFRNONAA 39 (L) 11/25/2022   GFRAA 73 10/13/2019   CALCIUM 8.3 (L) 11/25/2022   PROT 5.7 (L) 11/23/2022   ALBUMIN 3.3 (L) 11/23/2022   LABGLOB 2.1 10/13/2019   AGRATIO 2.1 10/13/2019   BILITOT 1.1 11/23/2022   ALKPHOS 29 (L) 11/23/2022   AST 21 11/23/2022   ALT 13 11/23/2022   ANIONGAP 9 11/25/2022    CBG (last 3)  No results for input(s): "GLUCAP" in the last 72 hours.    Coagulation Profile: No results for input(s): "INR", "PROTIME" in the last 168 hours.   Radiology Studies: ECHOCARDIOGRAM COMPLETE  Result Date: 11/23/2022    ECHOCARDIOGRAM REPORT   Patient Name:   Kara Bush Date of Exam: 11/23/2022 Medical Rec #:  [id number]          Height:       64.0 in Accession #:    [id number]         Weight:       198.4 lb Date of Birth:  1941/07/21         BSA:          1.950 m Patient Age:    81 years           BP:           146/56 mmHg Patient Gender: F                  HR:  88 bpm. Exam Location:  Inpatient Procedure: 2D Echo, Cardiac Doppler and Color Doppler STAT ECHO Indications:    elevated troponin  History:        Patient has prior history of Echocardiogram examinations,  most                 recent 08/27/2022. Prior CABG and resection of aortic aneurysm,                 PAD, Arrythmias:Atrial Fibrillation, Signs/Symptoms:Chest Pain                 and Shortness of Breath; Risk Factors:Hypertension, Dyslipidemia                 and Sleep Apnea.  Sonographer:    Delcie Roch RDCS Referring Phys: [id number] JENNIFER YATES IMPRESSIONS  1. Left ventricular ejection fraction, by estimation, is 60%. The left ventricle has normal function. The left ventricle has no regional wall motion abnormalities. There is mild left ventricular hypertrophy. Left ventricular diastolic parameters are indeterminate.  2. Right ventricular systolic function is normal. The right ventricular size is mildly enlarged. There is severely elevated pulmonary artery systolic pressure. The estimated right ventricular systolic pressure is 61.9 mmHg.  3. The mitral valve is grossly normal. Mild mitral valve regurgitation.  4. Tricuspid valve regurgitation is moderate to severe.  5. Fusion of right and left coronary cusps with raphe present.. 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 There is moderate calcification of the aortic valve. There is mild thickening of the aortic valve. Aortic valve regurgitation is mild. Mild aortic valve stenosis. Aortic valve mean gradient measures 15.0 mmHg. Aortic valve Vmax measures 2.57 m/s. Doppler alignment suboptimal, gradient and LVOT TVI may be underestimated.  6. There is borderline dilatation of the ascending aorta, measuring 40 mm.  7. The inferior vena cava is normal in size with <50% respiratory variability, suggesting right atrial pressure of 8 mmHg. FINDINGS  Left Ventricle: Left ventricular ejection fraction, by estimation, is 60%. The left ventricle has normal function. The left ventricle has no regional wall motion abnormalities. The left ventricular internal cavity size was normal in size. There is mild left ventricular hypertrophy. Left ventricular diastolic parameters are  indeterminate. Right Ventricle: The right ventricular size is mildly enlarged. No increase in right ventricular wall thickness. Right ventricular systolic function is normal. There is severely elevated pulmonary artery systolic pressure. The tricuspid regurgitant velocity is 3.67 m/s, and with an assumed right atrial pressure of 8 mmHg, the estimated right ventricular systolic pressure is 61.9 mmHg. Left Atrium: Left atrial size was normal in size. Right Atrium: Right atrial size was normal in size. Pericardium: There is no evidence of pericardial effusion. Mitral Valve: The mitral valve is grossly normal. Mild mitral valve regurgitation. Tricuspid Valve: The tricuspid valve is normal in structure. Tricuspid valve regurgitation is moderate to severe. No evidence of tricuspid stenosis. Aortic Valve: Fusion of right and left coronary cusps with raphe present. 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 There is moderate calcification of the aortic valve. There is mild thickening of the aortic valve. Aortic valve regurgitation is mild. Mild aortic stenosis is present. Aortic valve mean gradient measures 15.0 mmHg. Aortic valve peak gradient measures 26.4 mmHg. Aortic valve area, by VTI measures 0.88 cm. Pulmonic Valve: The pulmonic valve was normal in structure. Pulmonic valve regurgitation is mild. No evidence of pulmonic stenosis. Aorta: The aortic root is normal in size and structure. There is borderline dilatation of the ascending aorta, measuring 40  mm. Venous: The inferior vena cava is normal in size with less than 50% respiratory variability, suggesting right atrial pressure of 8 mmHg. IAS/Shunts: The interatrial septum was not well visualized.  LEFT VENTRICLE PLAX 2D LVIDd:         4.30 cm   Diastology LVIDs:         2.90 cm   LV e' lateral:   9.68 cm/s LV PW:         1.10 cm   LV E/e' lateral: 10.0 LV IVS:        1.00 cm LVOT diam:     2.10 cm LV SV:         54 LV SV Index:   27 LVOT Area:     3.46 cm  RIGHT VENTRICLE              IVC RV Basal diam:  3.30 cm     IVC diam: 2.00 cm RV S prime:     11.50 cm/s TAPSE (M-mode): 1.6 cm LEFT ATRIUM             Index        RIGHT ATRIUM           Index LA diam:        4.00 cm 2.05 cm/m   RA Area:     17.10 cm LA Vol (A2C):   62.0 ml 31.80 ml/m  RA Volume:   41.80 ml  21.44 ml/m LA Vol (A4C):   63.9 ml 32.77 ml/m LA Biplane Vol: 64.4 ml 33.03 ml/m  AORTIC VALVE AV Area (Vmax):    0.95 cm AV Area (Vmean):   0.90 cm AV Area (VTI):     0.88 cm AV Vmax:           257.00 cm/s AV Vmean:          183.000 cm/s AV VTI:            0.611 m AV Peak Grad:      26.4 mmHg AV Mean Grad:      15.0 mmHg LVOT Vmax:         70.45 cm/s LVOT Vmean:        47.700 cm/s LVOT VTI:          0.154 m LVOT/AV VTI ratio: 0.25  AORTA Ao Root diam: 3.30 cm Ao Asc diam:  4.00 cm MITRAL VALVE                TRICUSPID VALVE MV Area (PHT): 5.13 cm     TR Peak grad:   53.9 mmHg MV Decel Time: 148 msec     TR Vmax:        367.00 cm/s MR Peak grad: 85.7 mmHg MR Mean grad: 57.0 mmHg     SHUNTS MR Vmax:      463.00 cm/s   Systemic VTI:  0.15 m MR Vmean:     360.0 cm/s    Systemic Diam: 2.10 cm MV E velocity: 97.00 cm/s MV A velocity: 107.00 cm/s MV E/A ratio:  0.91 Weston Brass MD Electronically signed by Weston Brass MD Signature Date/Time: 11/23/2022/5:40:10 PM    Final        Kathlen Mody M.D. [facility] 11/25/2022, 10:27 AM  Available via Epic secure chat 7am-7pm After 7 pm, please refer to night coverage provider listed on amion.

## 2022-11-25 NOTE — Op Note (Signed)
[facility] Patient Name: Kara Bush Procedure Date : 11/25/2022 MRN: [id number] Attending MD: Jeani Hawking , MD, [id number] Date of Birth: Jul 13, 1941 CSN: [id number] Age: 82 Admit Type: Inpatient Procedure:                Colonoscopy Indications:              Hematochezia, Iron deficiency anemia Providers:                Jeani Hawking, MD, Vicki Mallet, RN, Marja Kays, Technician Referring MD:              Medicines:                Propofol per Anesthesia Complications:            No immediate complications. Estimated Blood Loss:     Estimated blood loss: none. Procedure:                Pre-Anesthesia Assessment:                           - Prior to the procedure, a History and Physical                            was performed, and patient medications and                            allergies were reviewed. The patient's tolerance of                            previous anesthesia was also reviewed. The risks                            and benefits of the procedure and the sedation                            options and risks were discussed with the patient.                            All questions were answered, and informed consent                            was obtained. Prior Anticoagulants: The patient has                            taken no anticoagulant or antiplatelet agents. ASA                            Grade Assessment: III - A patient with severe                            systemic disease. After reviewing the risks and  benefits, the patient was deemed in satisfactory                            condition to undergo the procedure.                           - Sedation was administered by an anesthesia                            professional. Deep sedation was attained.                           After obtaining informed consent, the colonoscope                            was passed under direct  vision. Throughout the                            procedure, the patient's blood pressure, pulse, and                            oxygen saturations were monitored continuously. The                            PCF-190TL ([id number] Olympus colonoscope was                            introduced through the anus and advanced to the the                            cecum, identified by appendiceal orifice and                            ileocecal valve. The colonoscopy was performed                            without difficulty. The patient tolerated the                            procedure well. The quality of the bowel                            preparation was evaluated using the BBPS [facility]                            Bowel Preparation Scale) with scores of: Right                            Colon = 2 (minor amount of residual staining, small                            fragments of stool and/or opaque liquid, but mucosa  seen well), Transverse Colon = 2 (minor amount of                            residual staining, small fragments of stool and/or                            opaque liquid, but mucosa seen well) and Left Colon                            = 2 (minor amount of residual staining, small                            fragments of stool and/or opaque liquid, but mucosa                            seen well). The total BBPS score equals 6. The                            quality of the bowel preparation was good. The                            ileocecal valve, appendiceal orifice, and rectum                            were photographed. Scope In: 2:06:53 PM Scope Out: 2:28:46 PM Scope Withdrawal Time: 0 hours 15 minutes 30 seconds  Total Procedure Duration: 0 hours 21 minutes 53 seconds  Findings:      Inflammation was found in a continuous and circumferential pattern from       the transverse colon to the cecum. This was graded as Mayo Score 1       (mild, with  erythema, decreased vascular pattern, mild friability), and       when compared to the previous examination, the findings are unchanged.       Biopsies were taken with a cold forceps for histology.      A single large patchy angiodysplastic lesion without bleeding was found       in the ascending colon and in the cecum.      Scattered medium-mouthed diverticula were found in the sigmoid colon and       descending colon.      There was no evidence of any bleeding in the colon, polyps, or masses.       The mucosa in the proximal colon and transverse colon were unchanged       from 2020. It was estimated that her mucosa was a Mayo Score of 1.       Random biopsies were obtained throughout the entire colon. The       presumption is that she had a diverticular bleed. Retroflexion in the       rectum was not possible. Impression:               - Mild (Mayo Score 1) ulcerative colitis, unchanged                            since the last examination. Biopsied.                           -  A single non-bleeding colonic angiodysplastic                            lesion.                           - Diverticulosis in the sigmoid colon and in the                            descending colon. Recommendation:           - Return patient to hospital ward for ongoing care.                           - Resume regular diet.                           - Continue present medications.                           - Await pathology results.                           - If HGB is stable tomorrow she can be discharged                            home.                           - Follow up in the office in 1-2 weeks. Procedure Code(s):        --- Professional ---                           [phone], Colonoscopy, flexible; with biopsy, single                            or multiple Diagnosis Code(s):        --- Professional ---                           K51.90, Ulcerative colitis, unspecified, without                             complications                           K55.20, Angiodysplasia of colon without hemorrhage                           K92.1, Melena (includes Hematochezia)                           D50.9, Iron deficiency anemia, unspecified                           K57.30, Diverticulosis of large intestine without  perforation or abscess without bleeding CPT copyright 2022 American Medical Association. All rights reserved. The codes documented in this report are preliminary and upon coder review may  be revised to meet current compliance requirements. Jeani Hawking, MD Jeani Hawking, MD 11/25/2022 2:52:37 PM This report has been signed electronically. Number of Addenda: 0

## 2022-11-25 NOTE — Plan of Care (Signed)

## 2022-11-25 NOTE — Transfer of Care (Signed)
Immediate Anesthesia Transfer of Care Note  Patient: Kara Bush  Procedure(s) Performed: ESOPHAGOGASTRODUODENOSCOPY (EGD) WITH PROPOFOL COLONOSCOPY WITH PROPOFOL BRONCHIAL BIOPSIES  Patient Location: Endoscopy Unit  Anesthesia Type:MAC  Level of Consciousness: lethargic and responds to stimulation  Airway & Oxygen Therapy: Patient Spontanous Breathing and Patient connected to nasal cannula oxygen  Post-op Assessment: Report given to RN  Post vital signs: Reviewed and stable  Last Vitals:  Vitals Value Taken Time  BP 127/61   Temp    Pulse 70   Resp 16   SpO2 93     Last Pain:  Vitals:   11/25/22 1230  TempSrc: Temporal  PainSc: 7       Patients Stated Pain Goal: 2 (11/24/22 2155)  Complications: No notable events documented.

## 2022-11-25 NOTE — Op Note (Signed)
[facility] Patient Name: Kara Bush Procedure Date : 11/25/2022 MRN: [id number] Attending MD: Jeani Hawking , MD, [id number] Date of Birth: 1940-12-09 CSN: [id number] Age: 82 Admit Type: Inpatient Procedure:                Upper GI endoscopy Indications:              Iron deficiency anemia Providers:                Jeani Hawking, MD, Vicki Mallet, RN, Marja Kays, Technician Referring MD:              Medicines:                Propofol per Anesthesia Complications:            No immediate complications. Estimated Blood Loss:     Estimated blood loss: none. Procedure:                Pre-Anesthesia Assessment:                           - Prior to the procedure, a History and Physical                            was performed, and patient medications and                            allergies were reviewed. The patient's tolerance of                            previous anesthesia was also reviewed. The risks                            and benefits of the procedure and the sedation                            options and risks were discussed with the patient.                            All questions were answered, and informed consent                            was obtained. Prior Anticoagulants: The patient has                            taken no anticoagulant or antiplatelet agents. ASA                            Grade Assessment: III - A patient with severe                            systemic disease. After reviewing the risks and  benefits, the patient was deemed in satisfactory                            condition to undergo the procedure.                           - Sedation was administered by an anesthesia                            professional. Deep sedation was attained.                           After obtaining informed consent, the endoscope was                            passed under direct vision.  Throughout the                            procedure, the patient's blood pressure, pulse, and                            oxygen saturations were monitored continuously. The                            GIF-H190 ([id number]) Olympus endoscope was introduced                            through the mouth, and advanced to the second part                            of duodenum. The upper GI endoscopy was                            accomplished without difficulty. The patient                            tolerated the procedure well. Scope In: Scope Out: Findings:      A 3 cm hiatal hernia was present.      The gastroesophageal flap valve was visualized endoscopically and       classified as Hill Grade IV (no fold, wide open lumen, hiatal hernia       present).      The stomach was normal.      The examined duodenum was normal. Impression:               - 3 cm hiatal hernia.                           - Gastroesophageal flap valve classified as Hill                            Grade IV (no fold, wide open lumen, hiatal hernia                            present).                           -  Normal stomach.                           - Normal examined duodenum.                           - No specimens collected. Recommendation:           - Proceed with the colonoscopy. Procedure Code(s):        --- Professional ---                           [phone], Esophagogastroduodenoscopy, flexible,                            transoral; diagnostic, including collection of                            specimen(s) by brushing or washing, when performed                            (separate procedure) Diagnosis Code(s):        --- Professional ---                           D50.9, Iron deficiency anemia, unspecified                           K44.9, Diaphragmatic hernia without obstruction or                            gangrene CPT copyright 2022 American Medical Association. All rights reserved. The codes documented in  this report are preliminary and upon coder review may  be revised to meet current compliance requirements. Jeani Hawking, MD Jeani Hawking, MD 11/25/2022 2:43:59 PM This report has been signed electronically. Number of Addenda: 0

## 2022-11-25 NOTE — Progress Notes (Addendum)
Rounding Note    Patient Name: Kara Bush Date of Encounter: 11/25/2022  [facility] Cardiologist: Chrystie Nose, MD   Subjective   She reports chest discomfort that radiated across her entire anterior chest that she associates with stress from bowel prep last night, no current chest pain  Inpatient Medications    Scheduled Meds:  sodium chloride   Intravenous Once   amLODipine  5 mg Oral Daily   atorvastatin  20 mg Oral Daily   citalopram  20 mg Oral Daily   metoprolol succinate  50 mg Oral Daily   [START ON 11/26/2022] pantoprazole  40 mg Intravenous Q12H   sodium chloride flush  3 mL Intravenous Q12H   Continuous Infusions:  lactated ringers 100 mL/hr at 11/25/22 0900   pantoprazole 8 mg/hr (11/25/22 0900)   PRN Meds: acetaminophen **OR** acetaminophen, hydrALAZINE, HYDROcodone-acetaminophen, melatonin, methocarbamol, morphine injection, ondansetron (ZOFRAN) IV, phenol, zolpidem   Vital Signs    Vitals:   11/25/22 0401 11/25/22 0754 11/25/22 0800 11/25/22 0900  BP: (!) 147/75 (!) 141/63 (!) 141/63   Pulse: 85 80 89 82  Resp: 18 16    Temp: 98 F (36.7 C) 97.8 F (36.6 C)    TempSrc:  Oral    SpO2: 95% 94% 97% 95%  Weight:      Height:        Intake/Output Summary (Last 24 hours) at 11/25/2022 1015 Last data filed at 11/25/2022 0900 Gross per 24 hour  Intake 4254.08 ml  Output 2 ml  Net 4252.08 ml      11/23/2022    3:25 AM 10/17/2022    1:36 PM 09/25/2022    1:28 PM  Last 3 Weights  Weight (lbs) 198 lb 6.6 oz 198 lb 6.4 oz 197 lb  Weight (kg) 90 kg 89.994 kg 89.359 kg      Telemetry    SR with PACs 70s - Personally Reviewed  ECG    No new tracings - Personally Reviewed  Physical Exam   GEN: No acute distress.   Neck: No JVD Cardiac: RRR, 4/6 systolic murmur Respiratory: Clear to auscultation bilaterally. GI: Soft, nontender, non-distended  MS: No edema; No deformity. Neuro:  Nonfocal  Psych: Normal affect   Labs     High Sensitivity Troponin:   Recent Labs  Lab 11/22/22 2215 11/23/22 0000 11/23/22 1428  TROPONINIHS 37* 128* 492*     Chemistry Recent Labs  Lab 11/23/22 0000 11/23/22 0552 11/24/22 0225 11/25/22 0301  NA  --  138 138 138  K  --  4.4 4.0 3.8  CL  --  109 105 107  CO2  --  23 21* 22  GLUCOSE  --  114* 104* 106*  BUN  --  CREATININE  --  1.22* 1.29* 1.35*  CALCIUM  --  8.6* 8.6* 8.3*  MG  --  1.6*  --   --   PROT 6.0* 5.7*  --   --   ALBUMIN 3.4* 3.3*  --   --   AST 20 21  --   --   ALT 11 13  --   --   ALKPHOS 32* 29*  --   --   BILITOT <0.1* 1.1  --   --   GFRNONAA  --  44* 41* 39*  ANIONGAP  --  Lipids No results for input(s): "CHOL", "TRIG", "HDL", "LABVLDL", "LDLCALC", "CHOLHDL" in the last 168 hours.  Hematology Recent Labs  Lab 11/23/22 1428 11/24/22 0225 11/24/22 1802 11/25/22 0301  WBC 9.8 8.9  --  11.1*  RBC 3.94 3.70*  3.63*  --  4.11  HGB 7.9* 7.4* 8.9* 8.6*  HCT 26.3* 25.4* 29.9* 28.5*  MCV 66.8* 68.6*  --  69.3*  MCH 20.1* 20.0*  --  20.9*  MCHC 30.0 29.1*  --  30.2  RDW 18.4* 18.7*  --  20.3*  PLT 196 171  --  138*   Thyroid No results for input(s): "TSH", "FREET4" in the last 168 hours.  BNPNo results for input(s): "BNP", "PROBNP" in the last 168 hours.  DDimer No results for input(s): "DDIMER" in the last 168 hours.   Radiology    ECHOCARDIOGRAM COMPLETE  Result Date: 11/23/2022    ECHOCARDIOGRAM REPORT   Patient Name:   Kara Bush Date of Exam: 11/23/2022 Medical Rec #:  [id number]          Height:       64.0 in Accession #:    [id number]         Weight:       198.4 lb Date of Birth:  January 06, 1941         BSA:          1.950 m Patient Age:    82 years           BP:           146/56 mmHg Patient Gender: F                  HR:           88 bpm. Exam Location:  Inpatient Procedure: 2D Echo, Cardiac Doppler and Color Doppler STAT ECHO Indications:    elevated troponin  History:        Patient has prior history of  Echocardiogram examinations, most                 recent 08/27/2022. Prior CABG and resection of aortic aneurysm,                 PAD, Arrythmias:Atrial Fibrillation, Signs/Symptoms:Chest Pain                 and Shortness of Breath; Risk Factors:Hypertension, Dyslipidemia                 and Sleep Apnea.  Sonographer:    Delcie Roch RDCS Referring Phys: [id number] JENNIFER YATES IMPRESSIONS  1. Left ventricular ejection fraction, by estimation, is 60%. The left ventricle has normal function. The left ventricle has no regional wall motion abnormalities. There is mild left ventricular hypertrophy. Left ventricular diastolic parameters are indeterminate.  2. Right ventricular systolic function is normal. The right ventricular size is mildly enlarged. There is severely elevated pulmonary artery systolic pressure. The estimated right ventricular systolic pressure is 61.9 mmHg.  3. The mitral valve is grossly normal. Mild mitral valve regurgitation.  4. Tricuspid valve regurgitation is moderate to severe.  5. Fusion of right and left coronary cusps with raphe present.. 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 There is moderate calcification of the aortic valve. There is mild thickening of the aortic valve. Aortic valve regurgitation is mild. Mild aortic valve stenosis. Aortic valve mean gradient measures 15.0 mmHg. Aortic valve Vmax measures 2.57 m/s. Doppler alignment suboptimal, gradient and LVOT TVI may be underestimated.  6. There is borderline dilatation of the ascending aorta, measuring 40 mm.  7. The inferior vena cava is  normal in size with <50% respiratory variability, suggesting right atrial pressure of 8 mmHg. FINDINGS  Left Ventricle: Left ventricular ejection fraction, by estimation, is 60%. The left ventricle has normal function. The left ventricle has no regional wall motion abnormalities. The left ventricular internal cavity size was normal in size. There is mild left ventricular hypertrophy. Left ventricular  diastolic parameters are indeterminate. Right Ventricle: The right ventricular size is mildly enlarged. No increase in right ventricular wall thickness. Right ventricular systolic function is normal. There is severely elevated pulmonary artery systolic pressure. The tricuspid regurgitant velocity is 3.67 m/s, and with an assumed right atrial pressure of 8 mmHg, the estimated right ventricular systolic pressure is 61.9 mmHg. Left Atrium: Left atrial size was normal in size. Right Atrium: Right atrial size was normal in size. Pericardium: There is no evidence of pericardial effusion. Mitral Valve: The mitral valve is grossly normal. Mild mitral valve regurgitation. Tricuspid Valve: The tricuspid valve is normal in structure. Tricuspid valve regurgitation is moderate to severe. No evidence of tricuspid stenosis. Aortic Valve: Fusion of right and left coronary cusps with raphe present. 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 There is moderate calcification of the aortic valve. There is mild thickening of the aortic valve. Aortic valve regurgitation is mild. Mild aortic stenosis is present. Aortic valve mean gradient measures 15.0 mmHg. Aortic valve peak gradient measures 26.4 mmHg. Aortic valve area, by VTI measures 0.88 cm. Pulmonic Valve: The pulmonic valve was normal in structure. Pulmonic valve regurgitation is mild. No evidence of pulmonic stenosis. Aorta: The aortic root is normal in size and structure. There is borderline dilatation of the ascending aorta, measuring 40 mm. Venous: The inferior vena cava is normal in size with less than 50% respiratory variability, suggesting right atrial pressure of 8 mmHg. IAS/Shunts: The interatrial septum was not well visualized.  LEFT VENTRICLE PLAX 2D LVIDd:         4.30 cm   Diastology LVIDs:         2.90 cm   LV e' lateral:   9.68 cm/s LV PW:         1.10 cm   LV E/e' lateral: 10.0 LV IVS:        1.00 cm LVOT diam:     2.10 cm LV SV:         54 LV SV Index:   27 LVOT Area:     3.46  cm  RIGHT VENTRICLE             IVC RV Basal diam:  3.30 cm     IVC diam: 2.00 cm RV S prime:     11.50 cm/s TAPSE (M-mode): 1.6 cm LEFT ATRIUM             Index        RIGHT ATRIUM           Index LA diam:        4.00 cm 2.05 cm/m   RA Area:     17.10 cm LA Vol (A2C):   62.0 ml 31.80 ml/m  RA Volume:   41.80 ml  21.44 ml/m LA Vol (A4C):   63.9 ml 32.77 ml/m LA Biplane Vol: 64.4 ml 33.03 ml/m  AORTIC VALVE AV Area (Vmax):    0.95 cm AV Area (Vmean):   0.90 cm AV Area (VTI):     0.88 cm AV Vmax:           257.00 cm/s AV Vmean:  183.000 cm/s AV VTI:            0.611 m AV Peak Grad:      26.4 mmHg AV Mean Grad:      15.0 mmHg LVOT Vmax:         70.45 cm/s LVOT Vmean:        47.700 cm/s LVOT VTI:          0.154 m LVOT/AV VTI ratio: 0.25  AORTA Ao Root diam: 3.30 cm Ao Asc diam:  4.00 cm MITRAL VALVE                TRICUSPID VALVE MV Area (PHT): 5.13 cm     TR Peak grad:   53.9 mmHg MV Decel Time: 148 msec     TR Vmax:        367.00 cm/s MR Peak grad: 85.7 mmHg MR Mean grad: 57.0 mmHg     SHUNTS MR Vmax:      463.00 cm/s   Systemic VTI:  0.15 m MR Vmean:     360.0 cm/s    Systemic Diam: 2.10 cm MV E velocity: 97.00 cm/s MV A velocity: 107.00 cm/s MV E/A ratio:  0.91 Weston Brass MD Electronically signed by Weston Brass MD Signature Date/Time: 11/23/2022/5:40:10 PM    Final     Cardiac Studies   Echo 11/23/22 IMPRESSIONS   1. Left ventricular ejection fraction, by estimation, is 60%. The left  ventricle has normal function. The left ventricle has no regional wall  motion abnormalities. There is mild left ventricular hypertrophy. Left  ventricular diastolic parameters are  indeterminate.   2. Right ventricular systolic function is normal. The right ventricular  size is mildly enlarged. There is severely elevated pulmonary artery  systolic pressure. The estimated right ventricular systolic pressure is  61.9 mmHg.   3. The mitral valve is grossly normal. Mild mitral valve regurgitation.    4. Tricuspid valve regurgitation is moderate to severe.   5. Fusion of right and left coronary cusps with raphe present.. The  aortic valve is bicuspid. There is moderate calcification of the aortic  valve. There is mild thickening of the aortic valve. Aortic valve  regurgitation is mild. Mild aortic valve  stenosis. Aortic valve mean gradient measures 15.0 mmHg. Aortic valve Vmax  measures 2.57 m/s. Doppler alignment suboptimal, gradient and LVOT TVI may  be underestimated.   6. There is borderline dilatation of the ascending aorta, measuring 40  mm.   7. The inferior vena cava is normal in size with <50% respiratory  variability, suggesting right atrial pressure of 8 mmHg.    Patient Profile     82 y.o. female  hx of coronary artery disease status post coronary artery bypass surgery (LIMA-->LAD; SVG-->Dx and RCA in 1998) with multiple percutaneous interventions,  peripheral arterial disease status post aorto-iliac bypass, paroxysmal atrial fibrillation on oral anticoagulation, hypertension, hypercholesteremia, bicuspid aortic valve with mild to moderate regurgitation, now admitted with acute anemia complicated by chest pain.   Assessment & Plan    Chest pain - EKG with new lateral ST depression - echo with preserved LVEF, mild LVH, normal RV, mild MR, moderate to severe TR, bicuspid aortic valve with mild AS - question of chest pain related to demand ischemia  - endoscopy today - HS troponin 37 --> 492 - EKG with inferior and lateral ST depression, STE AVR - will need to investigate bleeding prior to any ischemic evaluation   Acute anemia - Hb 6.2 --> 7.4  with 2 U PRBC - Hb 8.6 - per GI / scope today   Valvular heart disease Bicuspid aortic valve Ascending aorta 40 mm Moderate to severe TR Will eventually need referral to CT surgery vs structural heart team   PAF Diagnosed 2020 Telemetry with sinus rhythm 70s   Chronic anticoagulation Held eliquis for  endoscopy      For questions or updates, please contact [facility] Please consult [email or web address] for contact info under      Signed, Marcelino Duster, PA  11/25/2022, 10:15 AM    I was unable to see patient today, she was in endoscopy for procedure.  Little Ishikawa, MD

## 2022-11-25 NOTE — Interval H&P Note (Signed)
History and Physical Interval Note:  11/25/2022 12:49 PM  Kara Bush  has presented today for surgery, with the diagnosis of rectal bleeding.  The various methods of treatment have been discussed with the patient and family. After consideration of risks, benefits and other options for treatment, the patient has consented to  Procedure(s): ESOPHAGOGASTRODUODENOSCOPY (EGD) WITH PROPOFOL (N/A) COLONOSCOPY WITH PROPOFOL (N/A) as a surgical intervention.  The patient's history has been reviewed, patient examined, no change in status, stable for surgery.  I have reviewed the patient's chart and labs.  Questions were answered to the patient's satisfaction.     Aristea Posada D

## 2022-11-25 NOTE — Anesthesia Preprocedure Evaluation (Addendum)
Anesthesia Evaluation  Patient identified by MRN, date of birth, ID band Patient awake    Reviewed: Allergy & Precautions, H&P , NPO status , Patient's Chart, lab work & pertinent test results  Airway Mallampati: III  TM Distance: >3 FB Neck ROM: Full    Dental no notable dental hx. (+) Teeth Intact, Dental Advisory Given, Caps   Pulmonary sleep apnea , former smoker   Pulmonary exam normal breath sounds clear to auscultation       Cardiovascular Exercise Tolerance: Good hypertension, + CAD and + Past MI  Normal cardiovascular exam Rhythm:Regular Rate:Normal  ECHO 4/24  1. Left ventricular ejection fraction, by estimation, is 60%. The left  ventricle has normal function. The left ventricle has no regional wall  motion abnormalities. There is mild left ventricular hypertrophy. Left  ventricular diastolic parameters are  indeterminate.   2. Right ventricular systolic function is normal. The right ventricular  size is mildly enlarged. There is severely elevated pulmonary artery  systolic pressure. The estimated right ventricular systolic pressure is  61.9 mmHg.   3. The mitral valve is grossly normal. Mild mitral valve regurgitation.   4. Tricuspid valve regurgitation is moderate to severe.   5. Fusion of right and left coronary cusps with raphe present.. The  aortic valve is bicuspid. There is moderate calcification of the aortic  valve. There is mild thickening of the aortic valve. Aortic valve  regurgitation is mild. Mild aortic valve  stenosis. Aortic valve mean gradient measures 15.0 mmHg. Aortic valve Vmax  measures 2.57 m/s. Doppler alignment suboptimal, gradient and LVOT TVI may  be underestimated.   6. There is borderline dilatation of the ascending aorta, measuring 40  mm.   7. The inferior vena cava is normal in size with <50% respiratory  variability, suggesting right atrial pressure of 8 mmHg.      Neuro/Psych     Depression    negative neurological ROS  negative psych ROS   GI/Hepatic negative GI ROS, Neg liver ROS,,,  Endo/Other  negative endocrine ROS    Renal/GU negative Renal ROS  negative genitourinary   Musculoskeletal negative musculoskeletal ROS (+)  Fibromyalgia -  Abdominal   Peds negative pediatric ROS (+)  Hematology negative hematology ROS (+)   Anesthesia Other Findings   Reproductive/Obstetrics negative OB ROS                             Anesthesia Physical Anesthesia Plan  ASA: 4  Anesthesia Plan: MAC   Post-op Pain Management: Minimal or no pain anticipated   Induction: Intravenous  PONV Risk Score and Plan: 2 and Propofol infusion  Airway Management Planned: Natural Airway and Simple Face Mask  Additional Equipment: None  Intra-op Plan:   Post-operative Plan:   Informed Consent: I have reviewed the patients History and Physical, chart, labs and discussed the procedure including the risks, benefits and alternatives for the proposed anesthesia with the patient or authorized representative who has indicated his/her understanding and acceptance.    Discussed DNR with patient and Continue DNR.   Dental advisory given  Plan Discussed with: Anesthesiologist and CRNA  Anesthesia Plan Comments: (Patient is good with cardiac drugs and intubation.  NO chest compressions!)        Anesthesia Quick Evaluation

## 2022-11-25 NOTE — Anesthesia Postprocedure Evaluation (Signed)
Anesthesia Post Note  Patient: Kara Bush  Procedure(s) Performed: ESOPHAGOGASTRODUODENOSCOPY (EGD) WITH PROPOFOL COLONOSCOPY WITH PROPOFOL BRONCHIAL BIOPSIES     Patient location during evaluation: PACU Anesthesia Type: MAC Level of consciousness: awake and alert Pain management: pain level controlled Vital Signs Assessment: post-procedure vital signs reviewed and stable Respiratory status: spontaneous breathing Cardiovascular status: stable Anesthetic complications: no   No notable events documented.  Last Vitals:  Vitals:   11/25/22 1900 11/25/22 2046  BP:  134/75  Pulse: 82 93  Resp:  16  Temp:  37.3 C  SpO2: 93% 94%    Last Pain:  Vitals:   11/25/22 2046  TempSrc: Oral  PainSc:                  Lewie Loron

## 2022-11-26 ENCOUNTER — Other Ambulatory Visit: Payer: Self-pay | Admitting: Physician Assistant

## 2022-11-26 ENCOUNTER — Ambulatory Visit: Payer: PPO | Admitting: Rheumatology

## 2022-11-26 DIAGNOSIS — I2489 Other forms of acute ischemic heart disease: Secondary | ICD-10-CM | POA: Diagnosis not present

## 2022-11-26 DIAGNOSIS — M7711 Lateral epicondylitis, right elbow: Secondary | ICD-10-CM

## 2022-11-26 DIAGNOSIS — M19041 Primary osteoarthritis, right hand: Secondary | ICD-10-CM

## 2022-11-26 DIAGNOSIS — R072 Precordial pain: Secondary | ICD-10-CM

## 2022-11-26 DIAGNOSIS — Z8679 Personal history of other diseases of the circulatory system: Secondary | ICD-10-CM

## 2022-11-26 DIAGNOSIS — Z8719 Personal history of other diseases of the digestive system: Secondary | ICD-10-CM

## 2022-11-26 DIAGNOSIS — M797 Fibromyalgia: Secondary | ICD-10-CM

## 2022-11-26 DIAGNOSIS — I480 Paroxysmal atrial fibrillation: Secondary | ICD-10-CM

## 2022-11-26 DIAGNOSIS — M4302 Spondylolysis, cervical region: Secondary | ICD-10-CM

## 2022-11-26 DIAGNOSIS — M7061 Trochanteric bursitis, right hip: Secondary | ICD-10-CM

## 2022-11-26 DIAGNOSIS — G8929 Other chronic pain: Secondary | ICD-10-CM

## 2022-11-26 DIAGNOSIS — I2510 Atherosclerotic heart disease of native coronary artery without angina pectoris: Secondary | ICD-10-CM

## 2022-11-26 DIAGNOSIS — Z8639 Personal history of other endocrine, nutritional and metabolic disease: Secondary | ICD-10-CM

## 2022-11-26 DIAGNOSIS — Z8659 Personal history of other mental and behavioral disorders: Secondary | ICD-10-CM

## 2022-11-26 DIAGNOSIS — K922 Gastrointestinal hemorrhage, unspecified: Secondary | ICD-10-CM | POA: Diagnosis not present

## 2022-11-26 DIAGNOSIS — F5101 Primary insomnia: Secondary | ICD-10-CM

## 2022-11-26 DIAGNOSIS — Z13820 Encounter for screening for osteoporosis: Secondary | ICD-10-CM

## 2022-11-26 DIAGNOSIS — G4733 Obstructive sleep apnea (adult) (pediatric): Secondary | ICD-10-CM

## 2022-11-26 DIAGNOSIS — M533 Sacrococcygeal disorders, not elsewhere classified: Secondary | ICD-10-CM

## 2022-11-26 LAB — CBC
HCT: 28.5 % — ABNORMAL LOW (ref 36.0–46.0)
Hemoglobin: 8.6 g/dL — ABNORMAL LOW (ref 12.0–15.0)
MCH: 21.4 pg — ABNORMAL LOW (ref 26.0–34.0)
MCHC: 30.2 g/dL (ref 30.0–36.0)
MCV: 71.1 fL — ABNORMAL LOW (ref 80.0–100.0)
Platelets: 167 10*3/uL (ref 150–400)
RBC: 4.01 MIL/uL (ref 3.87–5.11)
RDW: 21.9 % — ABNORMAL HIGH (ref 11.5–15.5)
WBC: 9.7 10*3/uL (ref 4.0–10.5)
nRBC: 0 % (ref 0.0–0.2)

## 2022-11-26 LAB — BASIC METABOLIC PANEL
Anion gap: 9 (ref 5–15)
BUN: 13 mg/dL (ref 8–23)
CO2: 24 mmol/L (ref 22–32)
Calcium: 8.4 mg/dL — ABNORMAL LOW (ref 8.9–10.3)
Chloride: 104 mmol/L (ref 98–111)
Creatinine, Ser: 1.6 mg/dL — ABNORMAL HIGH (ref 0.44–1.00)
GFR, Estimated: 32 mL/min — ABNORMAL LOW (ref >60)
Glucose, Bld: 122 mg/dL — ABNORMAL HIGH (ref 70–99)
Potassium: 3.7 mmol/L (ref 3.5–5.1)
Sodium: 137 mmol/L (ref 135–145)

## 2022-11-26 LAB — SURGICAL PATHOLOGY

## 2022-11-26 MED ORDER — APIXABAN 5 MG PO TABS
5.0000 mg | ORAL_TABLET | Freq: Two times a day (BID) | ORAL | Status: DC
  Administered 2022-11-26 – 2022-11-27 (×3): 5 mg via ORAL
  Filled 2022-11-26 (×3): qty 1

## 2022-11-26 MED ORDER — FUROSEMIDE 10 MG/ML IJ SOLN
40.0000 mg | Freq: Once | INTRAMUSCULAR | Status: AC
  Administered 2022-11-26: 40 mg via INTRAVENOUS
  Filled 2022-11-26: qty 4

## 2022-11-26 NOTE — Progress Notes (Signed)
Referral to Dr. Lalla Brothers for [facility]  PET stress test ordered for EKG changes

## 2022-11-26 NOTE — Progress Notes (Signed)
Mobility Specialist Progress Note:   11/26/22 1445  Mobility  Activity Ambulated with assistance in hallway  Level of Assistance Standby assist, set-up cues, supervision of patient - no hands on  Assistive Device None  Distance Ambulated (ft) 170 ft  Activity Response Tolerated fair  Mobility Referral Yes  $Mobility charge 1 Mobility   Pt agreeable to mobility session. SpO2 88-94% on RA throughout session, however pt c/o significant SOB. Pt left sitting EOB with all needs met.   Addison Lank Mobility Specialist Please contact via SecureChat or  Rehab office at [phone]

## 2022-11-26 NOTE — Progress Notes (Signed)
   11/26/22 0813  Assess: MEWS Score  BP (!) 138/59  MAP (mmHg) 76  Pulse Rate (!) 104  ECG Heart Rate (!) 114  Resp 16  Level of Consciousness Alert  SpO2 93 %  O2 Device Nasal Cannula  O2 Flow Rate (L/min) 2 L/min  Assess: MEWS Score  MEWS Temp 0  MEWS Systolic 0  MEWS Pulse 2  MEWS RR 0  MEWS LOC 0  MEWS Score 2  MEWS Score Color Yellow  Assess: if the MEWS score is Yellow or Red  Were vital signs taken at a resting state? Yes  Focused Assessment No change from prior assessment  Does the patient meet 2 or more of the SIRS criteria? No  MEWS guidelines implemented  Yes, yellow  Treat  MEWS Interventions Considered administering scheduled or prn medications/treatments as ordered  Take Vital Signs  Increase Vital Sign Frequency  Yellow: Q2hr x1, continue Q4hrs until patient remains green for 12hrs  Escalate  MEWS: Escalate Yellow: Discuss with charge nurse and consider notifying provider and/or RRT  Notify: Charge Nurse/RN  Name of Charge Nurse/RN Notified Engineering geologist  Assess: SIRS CRITERIA  SIRS Temperature  0  SIRS Pulse 1  SIRS Respirations  0  SIRS WBC 0  SIRS Score Sum  1

## 2022-11-26 NOTE — Progress Notes (Addendum)
Rounding Note    Patient Name: Kara Bush Date of Encounter: 11/26/2022  [facility] Cardiologist: Chrystie Nose, MD   Subjective   Pt denies chest pain, but wants her abdominal aorta imaged  Inpatient Medications    Scheduled Meds:  sodium chloride   Intravenous Once   amLODipine  5 mg Oral Daily   atorvastatin  20 mg Oral Daily   citalopram  20 mg Oral Daily   metoprolol succinate  50 mg Oral Daily   pantoprazole  40 mg Intravenous Q12H   sodium chloride flush  3 mL Intravenous Q12H   Continuous Infusions:  lactated ringers Stopped (11/26/22 0730)   pantoprazole Stopped (11/26/22 0820)   PRN Meds: acetaminophen **OR** acetaminophen, hydrALAZINE, HYDROcodone-acetaminophen, melatonin, methocarbamol, morphine injection, ondansetron (ZOFRAN) IV, phenol, zolpidem   Vital Signs    Vitals:   11/25/22 1900 11/25/22 2046 11/26/22 0427 11/26/22 0813  BP:  134/75 (!) 132/58 (!) 138/59  Pulse: 82 93 (!) 104 (!) 104  Resp:  16  16  Temp:  99.1 F (37.3 C) 97.8 F (36.6 C)   TempSrc:  Oral Oral   SpO2: 93% 94% 95% 93%  Weight:      Height:        Intake/Output Summary (Last 24 hours) at 11/26/2022 0900 Last data filed at 11/26/2022 0310 Gross per 24 hour  Intake 2378.12 ml  Output --  Net 2378.12 ml      11/25/2022   12:30 PM 11/23/2022    3:25 AM 10/17/2022    1:36 PM  Last 3 Weights  Weight (lbs) 194 lb 198 lb 6.6 oz 198 lb 6.4 oz  Weight (kg) 87.998 kg 90 kg 89.994 kg      Telemetry    Afib with ventricular rates 90-110s - Personally Reviewed  ECG    Repeat pending - Personally Reviewed  Physical Exam   GEN: No acute distress.   Neck: No JVD Cardiac: irregular rhythm, tachycardic rate Respiratory: Clear to auscultation bilaterally. GI: Soft, nontender, non-distended  MS: No edema; No deformity. Neuro:  Nonfocal  Psych: Normal affect   Labs    High Sensitivity Troponin:   Recent Labs  Lab 11/22/22 2215 11/23/22 0000  11/23/22 1428  TROPONINIHS 37* 128* 492*     Chemistry Recent Labs  Lab 11/23/22 0000 11/23/22 0552 11/24/22 0225 11/25/22 0301  NA  --  138 138 138  K  --  4.4 4.0 3.8  CL  --  109 105 107  CO2  --  23 21* 22  GLUCOSE  --  114* 104* 106*  BUN  --  CREATININE  --  1.22* 1.29* 1.35*  CALCIUM  --  8.6* 8.6* 8.3*  MG  --  1.6*  --   --   PROT 6.0* 5.7*  --   --   ALBUMIN 3.4* 3.3*  --   --   AST 20 21  --   --   ALT 11 13  --   --   ALKPHOS 32* 29*  --   --   BILITOT <0.1* 1.1  --   --   GFRNONAA  --  44* 41* 39*  ANIONGAP  --  Lipids No results for input(s): "CHOL", "TRIG", "HDL", "LABVLDL", "LDLCALC", "CHOLHDL" in the last 168 hours.  Hematology Recent Labs  Lab 11/23/22 1428 11/24/22 0225 11/24/22 1802 11/25/22 0301  WBC 9.8 8.9  --  11.1*  RBC 3.94 3.70*  3.63*  --  4.11  HGB 7.9* 7.4* 8.9* 8.6*  HCT 26.3* 25.4* 29.9* 28.5*  MCV 66.8* 68.6*  --  69.3*  MCH 20.1* 20.0*  --  20.9*  MCHC 30.0 29.1*  --  30.2  RDW 18.4* 18.7*  --  20.3*  PLT 196 171  --  138*   Thyroid No results for input(s): "TSH", "FREET4" in the last 168 hours.  BNPNo results for input(s): "BNP", "PROBNP" in the last 168 hours.  DDimer No results for input(s): "DDIMER" in the last 168 hours.   Radiology    No results found.  Cardiac Studies   Echo 11/23/22: 1. Left ventricular ejection fraction, by estimation, is 60%. The left  ventricle has normal function. The left ventricle has no regional wall  motion abnormalities. There is mild left ventricular hypertrophy. Left  ventricular diastolic parameters are  indeterminate.   2. Right ventricular systolic function is normal. The right ventricular  size is mildly enlarged. There is severely elevated pulmonary artery  systolic pressure. The estimated right ventricular systolic pressure is  61.9 mmHg.   3. The mitral valve is grossly normal. Mild mitral valve regurgitation.   4. Tricuspid valve regurgitation is  moderate to severe.   5. Fusion of right and left coronary cusps with raphe present.. The  aortic valve is bicuspid. There is moderate calcification of the aortic  valve. There is mild thickening of the aortic valve. Aortic valve  regurgitation is mild. Mild aortic valve  stenosis. Aortic valve mean gradient measures 15.0 mmHg. Aortic valve Vmax  measures 2.57 m/s. Doppler alignment suboptimal, gradient and LVOT TVI may  be underestimated.   6. There is borderline dilatation of the ascending aorta, measuring 40  mm.   7. The inferior vena cava is normal in size with <50% respiratory  variability, suggesting right atrial pressure of 8 mmHg.   Patient Profile     82 y.o. female hx of coronary artery disease status post coronary artery bypass surgery (LIMA-->LAD; SVG-->Dx and RCA in 1998) with multiple percutaneous interventions, peripheral arterial disease status post aorto-iliac bypass, paroxysmal atrial fibrillation on oral anticoagulation, hypertension, hypercholesteremia, bicuspid aortic valve with mild to moderate regurgitation, now admitted with acute anemia complicated by chest pain.   Assessment & Plan    Chest pain - EKG with new lateral ST depression - echo with preserved LVEF, mild LVH, normal RV, mild MR, moderate to severe TR, bicuspid aortic valve with mild AS - ascending aort measured 40 mm - HS troponin 37 --> 492 - EKG with inferior and lateral STD, STE AVR - plan outpatient ischemic evaluation - chest pain seems related to stress   Acute anemia Hb 6.2 --> 7.4 with 2U PRBC - Hb today not yet checked, was 8.6 yesterday - per GI / scope - mild UC stable from last scope, single non-bleeding colon lesion, diverticulosis int eh sigmoid colon and descending colon - GI recommends repeat H/H - if stable, no further workup -Given she is on Eliquis for A-fib, will refer to Dr. 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   Medication Recommendations:  Continue current meds.  Restart Eliquis once OK per GI Other recommendations (labs, testing, etc):  Plan outpatient stress PET Follow up as an outpatient: We will schedule.  Will refer to Dr. Lalla Brothers for [facility] evaluation      For questions or updates, please contact [facility] Please consult [email or web address] for contact info under        Signed, Marcelino Duster, PA  11/26/2022, 9:00 AM     Patient seen and examined.  Agree with above documentation.  On exam, patient is alert and oriented, regular rate and rhythm, no murmurs, lungs CTAB, no LE edema or JVD.  EGD/colonoscopy completed yesterday, shows mild ulcerative colitis and also with diverticulosis.  Suspect her presentation represents demand ischemia in setting of GI bleed.  Plan outpatient PET stress test. Had Afib overnight but now back in sinus.  Would restart Eliquis once OK per GI.  Patient would like to be off anticoagulation, will refer to Dr. Lalla Brothers for watchman evaluation  Little Ishikawa, MD

## 2022-11-26 NOTE — Progress Notes (Signed)
CPAP @ bedside.

## 2022-11-26 NOTE — Care Management Important Message (Signed)
Important Message  Patient Details  Name: Kara Bush MRN: [id number] Date of Birth: July 12, 1941   Medicare Important Message Given:  Yes     Renie Ora 11/26/2022, 7:58 AM

## 2022-11-26 NOTE — Progress Notes (Signed)
PROGRESS NOTE    Kara Bush  ZOX:[id number] DOB: 20-Feb-1941 DOA: 11/22/2022  PCP: Jackelyn Poling, DO   Brief Narrative:  This 82 y.o. female with medical history significant of AAA, CAD s/p CABG, chronic fatigue/chronic pain/fibromyalgia, HTN, HLD, OSA, and UC presenting with CP/SOB.   She had been feeling bad and then started having bad chest pain/pressure. She was also found to have heme positive stools, severe anemia requiring prbc transfusions. GI consulted and she is scheduled for EGD and colonoscopy on 4/23. Cardiology on board.  EGD showed mild ulcerative colitis but is stable from last scope.  Single nonbleeding colon lesion.  Diverticulosis in the sigmoid colon.  H&H remains stable no further GI workup.  Cardiology recommended outpatient workup.   Assessment & Plan:   Principal Problem:   Acute gastrointestinal bleeding Active Problems:   OSA (obstructive sleep apnea)   Chronic fatigue syndrome with fibromyalgia   Hyperlipidemia   Hypertension   Symptomatic anemia   A-fib   Non-ST elevation (NSTEMI) myocardial infarction   Chronic combined systolic (congestive) and diastolic (congestive) heart failure   Chronic kidney disease, stage 3b   DNR (do not resuscitate)   Demand ischemia  Assessment and plan:  Acute blood loss anemia /microcytic anemia : Patient presented with melena, hemoglobin 6.0 on arrival. S/p 3 units of prbc transfusion, repeat H&H is around 8.6.  Last colonoscopy was on 05/2019.  GI is consulted. Continue with IV protonix BID.  Anemia panel reviewed.  Holding eliquis for now.  Underwent EGD 4/23 which showed mild ulcerative colitis, stable from last scope. Single nonbleeding colon lesion. H&H remains stable.  No further GI workup during this hospitalization..   Chest pain: In the setting of CAD s/p CABG and PCI in the past.  Elevated troponins from demand ischemia from anemia  Cardiology on board.  Echocardiogram showed preserved  LVEF. Cardiology planning outpatient ischemic evaluation.   Chest pain seems related to stress.    Paroxysmal atrial fibrillation: Rate controlled with Toprol XL.  Referral to Dr. Ranae Palms for Watchman device. Resume Eliquis when okay with GI.   Stage 3 B CKD Creatinine appears to be at baseline.  Creatinine at 1.35.     Hypertension:  BP parameters are optimal.  Continue Toprol 50 mg daily   OSA ON CPAP at home.     Hyperlipidemia Resume statin.     Chronic pain syndrome / fibromyalgia:  Resume home meds.  Continue with Celexa, robaxin .    Mild leukocytosis and thrombocytopenia:  Monitor and repeat cbc in am.      Estimated body mass index is 34.06 kg/m as calculated from the following:   Height as of this encounter:  (1.626 m).   Weight as of this encounter: 90 kg.   DVT prophylaxis: SCDs Code Status: DNR Family Communication: Daughter at bed side. Disposition Plan:     Status is: Inpatient Remains inpatient appropriate because: GI evaluation, shortness of breath, resumption of Eliquis    Consultants:  Gastroenterology Cardiology  Procedures: EGD Antimicrobials:   Anti-infectives (From admission, onward)    None      Subjective: Patient was seen and examined at bedside.  Overnight events noted. Patient reports feeling better, she underwent EGD, hemoglobin remained stable She continued to feel short of breath while walking and talking.  Objective: Vitals:   11/25/22 1900 11/25/22 2046 11/26/22 0427 11/26/22 0813  BP:  134/75 (!) 132/58 (!) 138/59  Pulse: 82 93 (!) 104 (!) 104  Resp:  16  16  Temp:  99.1 F (37.3 C) 97.8 F (36.6 C)   TempSrc:  Oral Oral   SpO2: 93% 94% 95% 93%  Weight:      Height:        Intake/Output Summary (Last 24 hours) at 11/26/2022 1459 Last data filed at 11/26/2022 0310 Gross per 24 hour  Intake 1900.75 ml  Output --  Net 1900.75 ml   Filed Weights   11/23/22 0325 11/25/22 1230  Weight: 90 kg 88 kg     Examination:  General exam: Appears calm and comfortable, deconditioned, not in any distress. Respiratory system: Mild bibasilar crackles, respiratory effort normal, no accessory muscle use, RR 15 Cardiovascular system: S1 & S2 heard, irregular rhythm, no murmur. Gastrointestinal system: Abdomen is soft, non tender, non distended, BS+ Central nervous system: Alert and oriented x 3. No focal neurological deficits. Extremities: No edema, no cyanosis, no clubbing Skin: No rashes, lesions or ulcers Psychiatry: Judgement and insight appear normal. Mood & affect appropriate.     Data Reviewed: I have personally reviewed following labs and imaging studies  CBC: Recent Labs  Lab 11/23/22 0552 11/23/22 1428 11/24/22 0225 11/24/22 1802 11/25/22 0301 11/26/22 1050  WBC 10.0 9.8 8.9  --  11.1* 9.7  NEUTROABS 7.1  --   --   --  7.7  --   HGB 7.9* 7.9* 7.4* 8.9* 8.6* 8.6*  HCT 26.4* 26.3* 25.4* 29.9* 28.5* 28.5*  MCV 68.8* 66.8* 68.6*  --  69.3* 71.1*  PLT 198 196 171  --  138* 167   Basic Metabolic Panel: Recent Labs  Lab 11/22/22 2215 11/23/22 0552 11/24/22 0225 11/25/22 0301 11/26/22 1050  NA 141 138 138 138 137  K 4.3 4.4 4.0 3.8 3.7  CL 108 109 105 107 104  CO2 22 23 21* 22 24  GLUCOSE 132* 114* 104* 106* 122*  BUN 23 19 13 12 13   CREATININE 1.37* 1.22* 1.29* 1.35* 1.60*  CALCIUM 9.1 8.6* 8.6* 8.3* 8.4*  MG  --  1.6*  --   --   --    GFR: Estimated Creatinine Clearance: 28.6 mL/min (A) (by C-G formula based on SCr of 1.6 mg/dL (H)). Liver Function Tests: Recent Labs  Lab 11/23/22 0000 11/23/22 0552  AST 20 21  ALT 11 13  ALKPHOS 32* 29*  BILITOT <0.1* 1.1  PROT 6.0* 5.7*  ALBUMIN 3.4* 3.3*   Recent Labs  Lab 11/23/22 0000  LIPASE 37   No results for input(s): "AMMONIA" in the last 168 hours. Coagulation Profile: No results for input(s): "INR", "PROTIME" in the last 168 hours. Cardiac Enzymes: No results for input(s): "CKTOTAL", "CKMB", "CKMBINDEX",  "TROPONINI" in the last 168 hours. BNP (last 3 results) No results for input(s): "PROBNP" in the last 8760 hours. HbA1C: No results for input(s): "HGBA1C" in the last 72 hours. CBG: No results for input(s): "GLUCAP" in the last 168 hours. Lipid Profile: No results for input(s): "CHOL", "HDL", "LDLCALC", "TRIG", "CHOLHDL", "LDLDIRECT" in the last 72 hours. Thyroid Function Tests: No results for input(s): "TSH", "T4TOTAL", "FREET4", "T3FREE", "THYROIDAB" in the last 72 hours. Anemia Panel: Recent Labs    11/24/22 0225 11/24/22 1802  VITAMINB12  --  381  FOLATE >40.0  --   FERRITIN  --  7*  TIBC  --  479*  IRON  --  73  RETICCTPCT 1.8  --    Sepsis Labs: No results for input(s): "PROCALCITON", "LATICACIDVEN" in the last 168 hours.  No results found  for this or any previous visit (from the past 240 hour(s)).   Radiology Studies: No results found.  Scheduled Meds:  sodium chloride   Intravenous Once   amLODipine  5 mg Oral Daily   atorvastatin  20 mg Oral Daily   citalopram  20 mg Oral Daily   metoprolol succinate  50 mg Oral Daily   pantoprazole  40 mg Intravenous Q12H   sodium chloride flush  3 mL Intravenous Q12H   Continuous Infusions:  lactated ringers Stopped (11/26/22 0730)     LOS: 3 days    Time spent: 50 mins    Willeen Niece, MD [facility]   If 7PM-7AM, please contact night-coverage

## 2022-11-26 NOTE — Evaluation (Signed)
Physical Therapy Evaluation Patient Details Name: Kara Bush MRN: [id number] DOB: 10/05/1940 Today's Date: 11/26/2022  History of Present Illness  Pt is an 82 year old female admitted on 11/22/22 for SOB. Past medical history significant of AAA, CAD s/p CABG, chronic fatigue/chronic pain/fibromyalgia, HTN, HLD, OSA, and UC.  Clinical Impression  Pt presents with admitting diagnosis above. Today pt was able to ambulate in hallway and navigate stairs with no AD independently. Pt did require 1 seated rest break as pt stated that she felt "nauseous and weak" however vital signs were stable. Pt presented with a lot of anxiety about movement and pt daughter states that she has fibromyalgia at baseline. Pt received on 2L at 93%. Pt titrated down to RA at 91% prior to ambulation. Pt remained between 89-92% on RA during ambulation. Pt cued for pursed lip breathing. Pt left on RA at 92%. Pt and daughter perseverated on need for HHPT for strengthening however educated that current deficits are likely more anxiety and endurance related rather than strength related. Despite this, pt may benefit from a home safety eval from HHPT then could likely progress to OPPT. Pt presents at or near baseline mobility. Pt has no further acute PT needs and will be signing off. Re consult PT if mobility status changes.      Recommendations for follow up therapy are one component of a multi-disciplinary discharge planning process, led by the attending physician.  Recommendations may be updated based on patient status, additional functional criteria and insurance authorization.  Follow Up Recommendations       Assistance Recommended at Discharge PRN  Patient can return home with the following  Assist for transportation    Equipment Recommendations None recommended by PT  Recommendations for Other Services       Functional Status Assessment Patient has had a recent decline in their functional status and demonstrates  the ability to make significant improvements in function in a reasonable and predictable amount of time.     Precautions / Restrictions Precautions Precautions: Fall Restrictions Weight Bearing Restrictions: No      Mobility  Bed Mobility Overal bed mobility: Modified Independent             General bed mobility comments: HOB elevated    Transfers Overall transfer level: Independent Equipment used: None                    Ambulation/Gait Ambulation/Gait assistance: Independent Gait Distance (Feet): 300 Feet Assistive device: None Gait Pattern/deviations: WFL(Within Functional Limits) Gait velocity: decreased     General Gait Details: Pt took 1 seated rest break stating that she felt nauseous and weak however vital signs were stable. Likely due to anxiety.  Stairs Stairs: Yes Stairs assistance: Independent Stair Management: Two rails, Alternating pattern, Forwards Number of Stairs: 4 General stair comments: no LOB noted  Wheelchair Mobility    Modified Rankin (Stroke Patients Only)       Balance Overall balance assessment: No apparent balance deficits (not formally assessed)                                           Pertinent Vitals/Pain Pain Assessment Pain Assessment: 0-10 Pain Score: 6  Pain Location: "all over" (Pt daughter reports that pt has fibromyalgia) Pain Descriptors / Indicators: Aching, Constant, Discomfort Pain Intervention(s): Monitored during session    Home Living Family/patient  expects to be discharged to:: Private residence Living Arrangements: Other relatives Technical sales engineer) Available Help at Discharge: Family;Available PRN/intermittently Type of Home: House Home Access: Stairs to enter Entrance Stairs-Rails: Right;Left;Can reach both Entrance Stairs-Number of Steps: 4 Alternate Level Stairs-Number of Steps: Flight Home Layout: Two level;Able to live on main level with bedroom/bathroom Home Equipment:  Rollator (4 wheels);Cane - single point;Shower seat;Shower seat - built in;Grab bars - toilet;Hand held shower head      Prior Function Prior Level of Function : Independent/Modified Independent;Driving             Mobility Comments: Ind no AD. Pt reports getting winded when going up stairs or inclines. ADLs Comments: Ind     Hand Dominance   Dominant Hand: Right    Extremity/Trunk Assessment   Upper Extremity Assessment Upper Extremity Assessment: Overall WFL for tasks assessed    Lower Extremity Assessment Lower Extremity Assessment: Overall WFL for tasks assessed    Cervical / Trunk Assessment Cervical / Trunk Assessment: Normal  Communication   Communication: HOH  Cognition Arousal/Alertness: Awake/alert Behavior During Therapy: Anxious Overall Cognitive Status: Within Functional Limits for tasks assessed                                          General Comments General comments (skin integrity, edema, etc.): Pt received on 2L at 93%. Pt titrated down to RA at 91% prior to ambulation. Pt remained between 89-92% on RA during ambulation. Pt cued for pursed lip breathing. Pt left on RA at 92%.    Exercises     Assessment/Plan    PT Assessment Patient does not need any further PT services  PT Problem List         PT Treatment Interventions      PT Goals (Current goals can be found in the Care Plan section)       Frequency       Co-evaluation               AM-PAC PT "6 Clicks" Mobility  Outcome Measure Help needed turning from your back to your side while in a flat bed without using bedrails?: None Help needed moving from lying on your back to sitting on the side of a flat bed without using bedrails?: None Help needed moving to and from a bed to a chair (including a wheelchair)?: None Help needed standing up from a chair using your arms (e.g., wheelchair or bedside chair)?: None Help needed to walk in hospital room?: None Help  needed climbing 3-5 steps with a railing? : None 6 Click Score: 24    End of Session Equipment Utilized During Treatment: Gait belt Activity Tolerance: Patient tolerated treatment well Patient left: in bed;with call bell/phone within reach;with family/visitor present Nurse Communication: Mobility status PT Visit Diagnosis: Other abnormalities of gait and mobility (R26.89)    Time: 0939-1010 PT Time Calculation (min) (ACUTE ONLY): 31 min   Charges:   PT Evaluation $PT Eval Low Complexity: 1 Low PT Treatments $Gait Training: 8-22 mins        Shela Nevin, PT, DPT Acute Rehab Services [phone]   Gladys Damme 11/26/2022, 3:17 PM

## 2022-11-27 DIAGNOSIS — K922 Gastrointestinal hemorrhage, unspecified: Secondary | ICD-10-CM | POA: Diagnosis not present

## 2022-11-27 LAB — TYPE AND SCREEN
ABO/RH(D): B POS
Antibody Screen: NEGATIVE

## 2022-11-27 LAB — BASIC METABOLIC PANEL
Anion gap: 10 (ref 5–15)
BUN: 14 mg/dL (ref 8–23)
CO2: 25 mmol/L (ref 22–32)
Calcium: 8.3 mg/dL — ABNORMAL LOW (ref 8.9–10.3)
Chloride: 103 mmol/L (ref 98–111)
Creatinine, Ser: 1.47 mg/dL — ABNORMAL HIGH (ref 0.44–1.00)
GFR, Estimated: 35 mL/min — ABNORMAL LOW (ref >60)
Glucose, Bld: 109 mg/dL — ABNORMAL HIGH (ref 70–99)
Potassium: 3.2 mmol/L — ABNORMAL LOW (ref 3.5–5.1)
Sodium: 138 mmol/L (ref 135–145)

## 2022-11-27 LAB — PHOSPHORUS: Phosphorus: 3.9 mg/dL (ref 2.5–4.6)

## 2022-11-27 LAB — CBC
HCT: 26.1 % — ABNORMAL LOW (ref 36.0–46.0)
Hemoglobin: 7.8 g/dL — ABNORMAL LOW (ref 12.0–15.0)
MCH: 21 pg — ABNORMAL LOW (ref 26.0–34.0)
MCHC: 29.9 g/dL — ABNORMAL LOW (ref 30.0–36.0)
MCV: 70.2 fL — ABNORMAL LOW (ref 80.0–100.0)
Platelets: 172 10*3/uL (ref 150–400)
RBC: 3.72 MIL/uL — ABNORMAL LOW (ref 3.87–5.11)
RDW: 22.2 % — ABNORMAL HIGH (ref 11.5–15.5)
WBC: 9.2 10*3/uL (ref 4.0–10.5)
nRBC: 0 % (ref 0.0–0.2)

## 2022-11-27 LAB — HEMOGLOBIN AND HEMATOCRIT, BLOOD
HCT: 27.9 % — ABNORMAL LOW (ref 36.0–46.0)
Hemoglobin: 8.3 g/dL — ABNORMAL LOW (ref 12.0–15.0)

## 2022-11-27 LAB — MAGNESIUM: Magnesium: 1.6 mg/dL — ABNORMAL LOW (ref 1.7–2.4)

## 2022-11-27 MED ORDER — FUROSEMIDE 10 MG/ML IJ SOLN
40.0000 mg | Freq: Once | INTRAMUSCULAR | Status: AC
  Administered 2022-11-27: 40 mg via INTRAVENOUS
  Filled 2022-11-27: qty 4

## 2022-11-27 MED ORDER — MAGNESIUM SULFATE 2 GM/50ML IV SOLN: 2.0000 g | Freq: Once | INTRAVENOUS | Status: DC

## 2022-11-27 MED ORDER — POTASSIUM CHLORIDE CRYS ER 20 MEQ PO TBCR
40.0000 meq | EXTENDED_RELEASE_TABLET | Freq: Once | ORAL | Status: AC
  Administered 2022-11-27: 40 meq via ORAL
  Filled 2022-11-27: qty 2

## 2022-11-27 MED ORDER — ATORVASTATIN CALCIUM 20 MG PO TABS: 20.0000 mg | ORAL_TABLET | Freq: Every day | ORAL | 1 refills | Status: DC

## 2022-11-27 MED ORDER — POTASSIUM CHLORIDE 20 MEQ PO PACK
40.0000 meq | PACK | Freq: Once | ORAL | Status: DC
  Filled 2022-11-27: qty 2

## 2022-11-27 MED ORDER — MAGNESIUM SULFATE 2 GM/50ML IV SOLN
2.0000 g | Freq: Once | INTRAVENOUS | Status: AC
  Administered 2022-11-27: 2 g via INTRAVENOUS
  Filled 2022-11-27: qty 50

## 2022-11-27 MED ORDER — POTASSIUM CHLORIDE 20 MEQ PO PACK: 40.0000 meq | PACK | Freq: Once | ORAL | Status: DC

## 2022-11-27 NOTE — Discharge Instructions (Addendum)
Advised to follow-up with primary care physician in 1 week. Advised to follow-up with gastroenterology as scheduled. Patient is being discharged home in 1-2 supplemental oxygen as needed.\ Advised to resume Entresto when renal functions improves and blood pressure allows. Advised to take Eliquis 2.5 mg twice daily for A-fib.

## 2022-11-27 NOTE — TOC Initial Note (Signed)
Transition of Care [facility]) - Initial/Assessment Note    Patient Details  Name: Kara Bush MRN: [id number] Date of Birth: 08/21/1940  [facility]) CM/SW Contact:    Gala Lewandowsky, RN Phone Number: 11/27/2022, 4:35 PM  Clinical Narrative: Patient was discussed in progression rounds. Patients plan is to return home with home health services. Case Manager spoke with daughter and the Medicare.gov list was discussed on the phone. Daughter chose [facility]- referral submitted and start of care to begin within 24-48 hours post transition home. Case Manager will follow up in the morning regarding oxygen.                 Expected Discharge Plan: Home w Home Health Services Barriers to Discharge: No Barriers Identified   Patient Goals and CMS Choice Patient states their goals for this hospitalization and ongoing recovery are:: to return home.   Choice offered to / list presented to :  (discussed Medicare.gov list over the phone.)     Expected Discharge Plan and Services In-house Referral: NA Discharge Planning Services: CM Consult Post Acute Care Choice: Home Health Living arrangements for the past 2 months: Single Family Home Expected Discharge Date: 11/27/22                   HH Arranged: PT HH Agency: [facility] Date [facility] Agency Contacted: 11/27/22 Time HH Agency Contacted: [phone] Representative spoke with at [facility] Agency: Kandee Keen  Prior Living Arrangements/Services Living arrangements for the past 2 months: Single Family Home Lives with:: Relatives (nephew) Patient language and need for interpreter reviewed:: Yes Do you feel safe going back to the place where you live?: Yes      Need for Family Participation in Patient Care: Yes (Comment) Care giver support system in place?: Yes (comment)   Criminal Activity/Legal Involvement Pertinent to Current Situation/Hospitalization: No - Comment as needed  Activities of Daily Living Home Assistive  Devices/Equipment: Eyeglasses ADL Screening (condition at time of admission) Patient's cognitive ability adequate to safely complete daily activities?: Yes Is the patient deaf or have difficulty hearing?: Yes Does the patient have difficulty seeing, even when wearing glasses/contacts?: No Does the patient have difficulty concentrating, remembering, or making decisions?: No Patient able to express need for assistance with ADLs?: Yes Does the patient have difficulty dressing or bathing?: Yes Independently performs ADLs?: No Communication: Independent Dressing (OT): Needs assistance Is this a change from baseline?: Pre-admission baseline Grooming: Independent Feeding: Independent Bathing: Needs assistance Is this a change from baseline?: Pre-admission baseline Toileting: Needs assistance Is this a change from baseline?: Change from baseline, expected to last <3 days In/Out Bed: Needs assistance Is this a change from baseline?: Change from baseline, expected to last <3 days Walks in Home: Needs assistance Is this a change from baseline?: Change from baseline, expected to last <3 days Does the patient have difficulty walking or climbing stairs?: Yes Weakness of Legs: Both Weakness of Arms/Hands: None  Permission Sought/Granted Permission sought to share information with : Case Manager, Magazine features editor, Family Supports       Permission granted to share info w AGENCY: [facility]        Emotional Assessment Appearance:: Appears stated age Attitude/Demeanor/Rapport: Engaged Affect (typically observed): Appropriate Orientation: : Oriented to Self, Oriented to Place Alcohol / Substance Use: Not Applicable Psych Involvement: No (comment)  Admission diagnosis:  Chest pain [R07.9] Symptomatic anemia [D64.9] Chest pain, unspecified type [R07.9] Patient Active Problem List   Diagnosis Date Noted  Demand ischemia 11/24/2022   Chest pain 11/23/2022   Non-ST elevation  (NSTEMI) myocardial infarction 11/23/2022   Acute gastrointestinal bleeding 11/23/2022   Chronic combined systolic (congestive) and diastolic (congestive) heart failure 11/23/2022   Chronic kidney disease, stage 3b 11/23/2022   DNR (do not resuscitate) 11/23/2022   A-fib 02/26/2021   Atrial fibrillation 09/09/2018   DOE (dyspnea on exertion) 08/10/2017   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   Greater trochanteric bursitis of right hip 11/07/2014   Sacro-iliac pain 11/07/2014   Chronic midline low back pain without sciatica 11/07/2013   Hepatitis 04/20/2013   Screening for STD (sexually transmitted disease) 04/20/2013   Recurrent UTI 04/20/2013   Abdominal aortic aneurysm    Ulcerative colitis    Obstructive sleep apnea    Fibromyalgia    Coronary artery disease    Basal cell carcinoma    Symptomatic anemia 01/11/2013   Ascending aortic aneurysm, 4.1 cm 01/11/2013   Cervical spondylolysis 10/15/2011   Chronic periscapular pain 10/15/2011   Chest wall pain    S/P resection of aortic aneurysm, 1998 prior to CABG    PAD (peripheral artery disease), aortic-iliac bypass 1991, mild carotid bruits    OSA (obstructive sleep apnea)    Chronic fatigue syndrome with fibromyalgia    Hyperlipidemia    Ulcerative colitis    Depression    CAD (coronary artery disease), CABG 1998, STENTS MULTIPLE SITES SINCE.     Hypertension    PCP:  Jackelyn Poling, DO Pharmacy:   [facility] - [place], [place] - [address] [place] [address] Phone: [phone] Fax: [phone]  [id number] - [place] [facility] - [address] [place] [address] Phone: [phone] Fax: [phone]  [facility] [id number] [place], [place] - [address] [place] [address] Phone: [phone] Fax: [phone]  Social Determinants of Health (SDOH) Social History: SDOH Screenings    Food Insecurity: No Food Insecurity (11/23/2022)  Housing: Low Risk  (11/23/2022)  Transportation Needs: No Transportation Needs (11/23/2022)  Utilities: Not At Risk (11/23/2022)  Depression (PHQ2-9): Low Risk  (10/17/2022)  Tobacco Use: Medium Risk (11/25/2022)   SDOH Interventions:     Readmission Risk Interventions     No data to display

## 2022-11-27 NOTE — Discharge Summary (Addendum)
Physician Discharge Summary  Kara Bush ZOX:[id number] DOB: Aug 24, 1940 DOA: 11/22/2022  PCP: Jackelyn Poling, DO  Admit date: 11/22/2022  Discharge date: 11/28/2022  Admitted From: Home.  Disposition:  Home.  Recommendations for Outpatient Follow-up:  Follow up with PCP in 1-2 weeks. Please obtain BMP/CBC in one week. Advised to follow-up with Gastroenterology as scheduled. Patient is being discharged home in 1-2L/m supplemental oxygen as needed. Advised to resume Entresto when renal functions improves and blood pressure allows. Advised to take Eliquis 2.5 mg twice daily for A-fib.  Home Health:None Equipment/Devices:Home oxygen@ 1l/min  Discharge Condition: Stable CODE STATUS:DNR Diet recommendation: Heart Healthy   Brief Summary/ Hospital Course: This 82 y.o. female with medical history significant of AAA, CAD s/p CABG, chronic fatigue/chronic pain/fibromyalgia, HTN, HLD, OSA, and UC presenting with CP/SOB.   She had been feeling bad and then started having bad chest pain/pressure. She was also found to have heme positive stools, severe anemia requiring prbc transfusions. GI consulted and she is scheduled for EGD and colonoscopy on 4/23. Cardiology on board.  EGD showed mild ulcerative colitis but is stable from last scope.  Single nonbleeding colon lesion.  Diverticulosis in the sigmoid colon.  H&H remains stable no further GI workup.  Cardiology recommended outpatient workup.  Patient continued to remain hypoxic requiring supplemental oxygen.  Home oxygen arranged.  Patient is resumed on Eliquis low-dose and because of increased creatinine.  Patient feels better wants to be discharged.  Patient is being discharged home.  Discharge Diagnoses:  Principal Problem:   Acute gastrointestinal bleeding Active Problems:   OSA (obstructive sleep apnea)   Chronic fatigue syndrome with fibromyalgia   Hyperlipidemia   Hypertension   Symptomatic anemia   A-fib (HCC)   Non-ST  elevation (NSTEMI) myocardial infarction (HCC)   Chronic combined systolic (congestive) and diastolic (congestive) heart failure (HCC)   Chronic kidney disease, stage 3b (HCC)   DNR (do not resuscitate)   Demand ischemia  Acute blood loss anemia /microcytic anemia : Patient presented with melena, hemoglobin 6.0 on arrival. S/p 3 units of prbc transfusion, repeat H&H is around 8.6.  Last colonoscopy was on 05/2019.  GI is consulted. Continue with IV protonix BID.  Anemia panel reviewed.  Holding eliquis for now.  Underwent EGD 4/23 which showed mild ulcerative colitis, stable from last scope. Single nonbleeding colon lesion. H&H remains stable.  No further GI workup during this hospitalization. Eliquis resumed on the lower dosing.   Chest pain: In the setting of CAD s/p CABG and PCI in the past.  Elevated troponins from demand ischemia from anemia  Cardiology on board.  Echocardiogram showed preserved LVEF. Cardiology planning outpatient ischemic evaluation.   Chest pain seems related to stress.    Paroxysmal atrial fibrillation: Rate controlled with Toprol XL.  Referral to Dr. Ranae Palms for Watchman device. Resumed Eliquis when okay with GI.   AKI on Stage 3 B CKD Creatinine appeared to be at baseline.  Slightly spiked to 1.74 because of Lasix. Advised to drink fluids at home.  Recheck serum creatinine.    Hypertension:  BP parameters are optimal.  Continue Toprol 50 mg daily   OSA ON CPAP at home.     Hyperlipidemia Resume statin.     Chronic pain syndrome / fibromyalgia:  Resume home meds.  Continue with Celexa, robaxin .    Mild leukocytosis and thrombocytopenia:  Monitor and repeat cbc in am.      Estimated body mass index is 34.06 kg/m as calculated from  the following:   Height as of this encounter: 5\' 4"  (1.626 m).   Weight as of this encounter: 90 kg.  Discharge Instructions  Discharge Instructions     Call MD for:  difficulty breathing, headache or  visual disturbances   Complete by: As directed    Call MD for:  persistant dizziness or light-headedness   Complete by: As directed    Call MD for:  persistant nausea and vomiting   Complete by: As directed    Diet - low sodium heart healthy   Complete by: As directed    Diet - low sodium heart healthy   Complete by: As directed    Diet Carb Modified   Complete by: As directed    Discharge instructions   Complete by: As directed    Advised to follow-up with primary care physician in 1 week. Advised to follow-up with gastroenterology as scheduled. Patient is being discharged home in 1-2 supplemental oxygen as needed.   Increase activity slowly   Complete by: As directed       Allergies as of 11/28/2022       Reactions   Biotin Hives   Lisinopril Cough   Atenolol Other (See Comments)   colitis Other reaction(s): Bowel issues   Duloxetine Hcl Other (See Comments)   Mad pt feel spacey Other reaction(s): shaky; tired in the PMs   Lyrica [pregabalin] Other (See Comments)   Made pt feel spacey   Penicillin G Rash   Other reaction(s): rash   Penicillins Rash   Sulfamethoxazole-trimethoprim Nausea And Vomiting   Other reaction(s): elevated liver functions        Medication List     STOP taking these medications    Entresto 97-103 MG Generic drug: sacubitril-valsartan   simvastatin 40 MG tablet Commonly known as: ZOCOR       TAKE these medications    amLODipine 5 MG tablet Commonly known as: NORVASC TAKE ONE TABLET BY MOUTH ONCE daily   apixaban 2.5 MG Tabs tablet Commonly known as: ELIQUIS Take 1 tablet (2.5 mg total) by mouth 2 (two) times daily. What changed:  medication strength how much to take   atorvastatin 20 MG tablet Commonly known as: LIPITOR Take 1 tablet (20 mg total) by mouth daily.   b complex vitamins capsule Take 1 capsule by mouth daily.   betamethasone dipropionate 0.05 % cream Apply 1 Application topically daily.   citalopram  20 MG tablet Commonly known as: CELEXA Take 1 tablet by mouth daily.   clobetasol ointment 0.05 % Commonly known as: TEMOVATE Apply 1 Application topically 2 (two) times daily.   ezetimibe 10 MG tablet Commonly known as: ZETIA TAKE ONE TABLET BY MOUTH ONCE daily   furosemide 40 MG tablet Commonly known as: LASIX Take 1 tablet (40 mg total) by mouth 3 (three) times a week. Increase to every day as needed for weight gain of 2lbs overnight or 5lbs in 1 week   HYDROcodone-acetaminophen 5-325 MG tablet Commonly known as: NORCO/VICODIN Take 1 tablet by mouth 4 (four) times daily as needed for moderate pain. May Take an extra 0.5 to one tablet when pain is severe.   methocarbamol 500 MG tablet Commonly known as: ROBAXIN TAKE 1 TABLET BY MOUTH EVERY 8 HOURS AS NEEDED FOR MUSCLE SPASM What changed: See the new instructions.   metoprolol succinate 50 MG 24 hr tablet Commonly known as: TOPROL-XL TAKE ONE TABLET BY MOUTH ONCE daily   multivitamin with minerals Tabs tablet Take 1  tablet by mouth daily.   ondansetron 4 MG tablet Commonly known as: ZOFRAN Take 1 tablet by mouth every 6 (six) hours as needed.   potassium chloride 10 MEQ tablet Commonly known as: KLOR-CON M Take 1 tablet (10 mEq total) by mouth 3 (three) times a week. And with extra lasix as needed.   zolpidem 12.5 MG CR tablet Commonly known as: AMBIEN CR Take 12.5 mg by mouth at bedtime as needed.               Durable Medical Equipment  (From admission, onward)           Start     Ordered   11/28/22 1150  DME Oxygen  Once       Question Answer Comment  Length of Need Lifetime   Mode or (Route) Nasal cannula   Liters per Minute 1   Frequency Continuous (stationary and portable oxygen unit needed)   Oxygen conserving device Yes   Oxygen delivery system Gas      11/28/22 1149            Follow-up Information     Jackelyn Poling, DO Follow up in 1 week(s).   Specialty: Family  Medicine Contact information: [address] [place] [address] [phone]         Jeani Hawking, MD Follow up in 2 week(s).   Specialty: Gastroenterology Contact information: [address] [place] [address] [phone]         [facility] Follow up.   Specialty: Home Health Services Why: Physical Therapy-office to call with visit times. Contact information: [address] [place] [address] [phone]         [facility] Follow up.   Why: Oxygen Contact information: [address] [place] [address] [phone]                Allergies  Allergen Reactions   Biotin Hives   Lisinopril Cough   Atenolol Other (See Comments)    colitis Other reaction(s): Bowel issues   Duloxetine Hcl Other (See Comments)    Mad pt feel spacey Other reaction(s): shaky; tired in the PMs   Lyrica [Pregabalin] Other (See Comments)    Made pt feel spacey   Penicillin G Rash    Other reaction(s): rash   Penicillins Rash   Sulfamethoxazole-Trimethoprim Nausea And Vomiting    Other reaction(s): elevated liver functions    Consultations: Gastroenterology Cardiology   Procedures/Studies: ECHOCARDIOGRAM COMPLETE  Result Date: 11/23/2022    ECHOCARDIOGRAM REPORT   Patient Name:   Kara Bush Date of Exam: 11/23/2022 Medical Rec #:  [id number]          Height:       64.0 in Accession #:    [id number]         Weight:       198.4 lb Date of Birth:  [birth year]         BSA:          1.950 m Patient Age:    81 years           BP:           146/56 mmHg Patient Gender: F                  HR:           88 bpm. Exam Location:  Inpatient Procedure: 2D Echo, Cardiac Doppler and Color Doppler  STAT ECHO Indications:    elevated troponin  History:        Patient has prior history of Echocardiogram examinations, most                 recent 08/27/2022. Prior CABG and resection of aortic aneurysm,                 PAD,  Arrythmias:Atrial Fibrillation, Signs/Symptoms:Chest Pain                 and Shortness of Breath; Risk Factors:Hypertension, Dyslipidemia                 and Sleep Apnea.  Sonographer:    Delcie Roch RDCS Referring Phys: [id number] JENNIFER YATES IMPRESSIONS  1. Left ventricular ejection fraction, by estimation, is 60%. The left ventricle has normal function. The left ventricle has no regional wall motion abnormalities. There is mild left ventricular hypertrophy. Left ventricular diastolic parameters are indeterminate.  2. Right ventricular systolic function is normal. The right ventricular size is mildly enlarged. There is severely elevated pulmonary artery systolic pressure. The estimated right ventricular systolic pressure is 61.9 mmHg.  3. The mitral valve is grossly normal. Mild mitral valve regurgitation.  4. Tricuspid valve regurgitation is moderate to severe.  5. Fusion of right and left coronary cusps with raphe present.. 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 There is moderate calcification of the aortic valve. There is mild thickening of the aortic valve. Aortic valve regurgitation is mild. Mild aortic valve stenosis. Aortic valve mean gradient measures 15.0 mmHg. Aortic valve Vmax measures 2.57 m/s. Doppler alignment suboptimal, gradient and LVOT TVI may be underestimated.  6. There is borderline dilatation of the ascending aorta, measuring 40 mm.  7. The inferior vena cava is normal in size with <50% respiratory variability, suggesting right atrial pressure of 8 mmHg. FINDINGS  Left Ventricle: Left ventricular ejection fraction, by estimation, is 60%. The left ventricle has normal function. The left ventricle has no regional wall motion abnormalities. The left ventricular internal cavity size was normal in size. There is mild left ventricular hypertrophy. Left ventricular diastolic parameters are indeterminate. Right Ventricle: The right ventricular size is mildly enlarged. No increase in right ventricular wall  thickness. Right ventricular systolic function is normal. There is severely elevated pulmonary artery systolic pressure. The tricuspid regurgitant velocity is 3.67 m/s, and with an assumed right atrial pressure of 8 mmHg, the estimated right ventricular systolic pressure is 61.9 mmHg. Left Atrium: Left atrial size was normal in size. Right Atrium: Right atrial size was normal in size. Pericardium: There is no evidence of pericardial effusion. Mitral Valve: The mitral valve is grossly normal. Mild mitral valve regurgitation. Tricuspid Valve: The tricuspid valve is normal in structure. Tricuspid valve regurgitation is moderate to severe. No evidence of tricuspid stenosis. Aortic Valve: Fusion of right and left coronary cusps with raphe present. 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 There is moderate calcification of the aortic valve. There is mild thickening of the aortic valve. Aortic valve regurgitation is mild. Mild aortic stenosis is present. Aortic valve mean gradient measures 15.0 mmHg. Aortic valve peak gradient measures 26.4 mmHg. Aortic valve area, by VTI measures 0.88 cm. Pulmonic Valve: The pulmonic valve was normal in structure. Pulmonic valve regurgitation is mild. No evidence of pulmonic stenosis. Aorta: The aortic root is normal in size and structure. There is borderline dilatation of the ascending aorta, measuring 40 mm. Venous: The inferior vena cava is normal in size with less than 50%  respiratory variability, suggesting right atrial pressure of 8 mmHg. IAS/Shunts: The interatrial septum was not well visualized.  LEFT VENTRICLE PLAX 2D LVIDd:         4.30 cm   Diastology LVIDs:         2.90 cm   LV e' lateral:   9.68 cm/s LV PW:         1.10 cm   LV E/e' lateral: 10.0 LV IVS:        1.00 cm LVOT diam:     2.10 cm LV SV:         54 LV SV Index:   27 LVOT Area:     3.46 cm  RIGHT VENTRICLE             IVC RV Basal diam:  3.30 cm     IVC diam: 2.00 cm RV S prime:     11.50 cm/s TAPSE (M-mode): 1.6 cm LEFT  ATRIUM             Index        RIGHT ATRIUM           Index LA diam:        4.00 cm 2.05 cm/m   RA Area:     17.10 cm LA Vol (A2C):   62.0 ml 31.80 ml/m  RA Volume:   41.80 ml  21.44 ml/m LA Vol (A4C):   63.9 ml 32.77 ml/m LA Biplane Vol: 64.4 ml 33.03 ml/m  AORTIC VALVE AV Area (Vmax):    0.95 cm AV Area (Vmean):   0.90 cm AV Area (VTI):     0.88 cm AV Vmax:           257.00 cm/s AV Vmean:          183.000 cm/s AV VTI:            0.611 m AV Peak Grad:      26.4 mmHg AV Mean Grad:      15.0 mmHg LVOT Vmax:         70.45 cm/s LVOT Vmean:        47.700 cm/s LVOT VTI:          0.154 m LVOT/AV VTI ratio: 0.25  AORTA Ao Root diam: 3.30 cm Ao Asc diam:  4.00 cm MITRAL VALVE                TRICUSPID VALVE MV Area (PHT): 5.13 cm     TR Peak grad:   53.9 mmHg MV Decel Time: 148 msec     TR Vmax:        367.00 cm/s MR Peak grad: 85.7 mmHg MR Mean grad: 57.0 mmHg     SHUNTS MR Vmax:      463.00 cm/s   Systemic VTI:  0.15 m MR Vmean:     360.0 cm/s    Systemic Diam: 2.10 cm MV E velocity: 97.00 cm/s MV A velocity: 107.00 cm/s MV E/A ratio:  0.91 Weston Brass MD Electronically signed by Weston Brass MD Signature Date/Time: 11/23/2022/5:40:10 PM    Final    DG Chest 2 View  Result Date: 11/22/2022 CLINICAL DATA:  Chest pain EXAM: CHEST - 2 VIEW COMPARISON:  04/13/2022 FINDINGS: Prior CABG. Heart and mediastinal contours within normal limits. Interstitial prominence throughout the lungs. Suspect trace bilateral effusions. No acute bony abnormality. Aortic atherosclerosis. IMPRESSION: Interstitial prominence within the lungs, slightly basilar predominant. This could reflect chronic lung disease, edema, or atypical infection. Electronically Signed  By: Charlett Nose M.D.   On: 11/22/2022 22:30     Subjective: Patient was seen and examined at bedside.  Overnight events noted.   Patient reports doing much better.  She ambulated and requiring oxygen 1 L.   Home oxygen arranged.  Patient being discharged  home.  Discharge Exam: Vitals:   11/28/22 0914 11/28/22 1401  BP: (!) 159/81 (!) 143/54  Pulse: 88 79  Resp:  18  Temp:  97.6 F (36.4 C)  SpO2:  97%   Vitals:   11/28/22 0045 11/28/22 0307 11/28/22 0914 11/28/22 1401  BP:  (!) 150/71 (!) 159/81 (!) 143/54  Pulse: 71 79 88 79  Resp:  16  18  Temp:  98.6 F (37 C)  97.6 F (36.4 C)  TempSrc:  Axillary  Oral  SpO2: 97% 99%  97%  Weight:      Height:        General: Pt is alert, awake, not in acute distress Cardiovascular: RRR, S1/S2 +, no rubs, no gallops Respiratory: CTA bilaterally, no wheezing, no rhonchi Abdominal: Soft, NT, ND, bowel sounds + Extremities: no edema, no cyanosis    The results of significant diagnostics from this hospitalization (including imaging, microbiology, ancillary and laboratory) are listed below for reference.     Microbiology: No results found for this or any previous visit (from the past 240 hour(s)).   Labs: BNP (last 3 results) No results for input(s): "BNP" in the last 8760 hours. Basic Metabolic Panel: Recent Labs  Lab 11/23/22 0552 11/24/22 0225 11/25/22 0301 11/26/22 1050 11/27/22 0326 11/28/22 0301  NA 138 138 138 137 138 139  K 4.4 4.0 3.8 3.7 3.2* 3.4*  CL 109 105 107 104 103 100  CO2 23 21* 22 24 25 26   GLUCOSE 114* 104* 106* 122* 109* 104*  BUN 19 13 12 13 14 16   CREATININE 1.22* 1.29* 1.35* 1.60* 1.47* 1.74*  CALCIUM 8.6* 8.6* 8.3* 8.4* 8.3* 8.5*  MG 1.6*  --   --   --  1.6* 1.6*  PHOS  --   --   --   --  3.9 3.8   Liver Function Tests: Recent Labs  Lab 11/23/22 0000 11/23/22 0552  AST 20 21  ALT 11 13  ALKPHOS 32* 29*  BILITOT <0.1* 1.1  PROT 6.0* 5.7*  ALBUMIN 3.4* 3.3*   Recent Labs  Lab 11/23/22 0000  LIPASE 37   No results for input(s): "AMMONIA" in the last 168 hours. CBC: Recent Labs  Lab 11/23/22 0552 11/23/22 1428 11/24/22 0225 11/24/22 1802 11/25/22 0301 11/26/22 1050 11/27/22 0326 11/27/22 1127 11/28/22 0301  WBC 10.0   < >  8.9  --  11.1* 9.7 9.2  --  10.3  NEUTROABS 7.1  --   --   --  7.7  --   --   --   --   HGB 7.9*   < > 7.4*   < > 8.6* 8.6* 7.8* 8.3* 8.3*  HCT 26.4*   < > 25.4*   < > 28.5* 28.5* 26.1* 27.9* 28.9*  MCV 68.8*   < > 68.6*  --  69.3* 71.1* 70.2*  --  72.8*  PLT 198   < > 171  --  138* 167 172  --  157   < > = values in this interval not displayed.   Cardiac Enzymes: No results for input(s): "CKTOTAL", "CKMB", "CKMBINDEX", "TROPONINI" in the last 168 hours. BNP: Invalid input(s): "POCBNP" CBG: No results for  input(s): "GLUCAP" in the last 168 hours. D-Dimer No results for input(s): "DDIMER" in the last 72 hours. Hgb A1c No results for input(s): "HGBA1C" in the last 72 hours. Lipid Profile No results for input(s): "CHOL", "HDL", "LDLCALC", "TRIG", "CHOLHDL", "LDLDIRECT" in the last 72 hours. Thyroid function studies No results for input(s): "TSH", "T4TOTAL", "T3FREE", "THYROIDAB" in the last 72 hours.  Invalid input(s): "FREET3" Anemia work up No results for input(s): "VITAMINB12", "FOLATE", "FERRITIN", "TIBC", "IRON", "RETICCTPCT" in the last 72 hours.  Urinalysis    Component Value Date/Time   COLORURINE YELLOW 11/20/2015 1757   APPEARANCEUR CLEAR 11/20/2015 1757   LABSPEC >1.046 (H) 11/20/2015 1757   PHURINE 6.0 11/20/2015 1757   GLUCOSEU NEGATIVE 11/20/2015 1757   HGBUR NEGATIVE 11/20/2015 1757   BILIRUBINUR NEGATIVE 11/20/2015 1757   KETONESUR NEGATIVE 11/20/2015 1757   PROTEINUR NEGATIVE 11/20/2015 1757   NITRITE NEGATIVE 11/20/2015 1757   LEUKOCYTESUR NEGATIVE 11/20/2015 1757   Sepsis Labs Recent Labs  Lab 11/25/22 0301 11/26/22 1050 11/27/22 0326 11/28/22 0301  WBC 11.1* 9.7 9.2 10.3   Microbiology No results found for this or any previous visit (from the past 240 hour(s)).   Time coordinating discharge: Over 30 minutes  SIGNED:   Willeen Niece, MD  [facility] 11/28/2022, 3:50 PM Pager   If 7PM-7AM, please contact night-coverage

## 2022-11-27 NOTE — Progress Notes (Signed)
Pt's K+ was 3.2 this a.m & Hgb 7.8. Attempted to notify Mansy, MD; see new orders. Will continue to monitor.  Bari Edward, RN

## 2022-11-27 NOTE — Progress Notes (Addendum)
PROGRESS NOTE    CIMONE FAHEY  ONG:[id number] DOB: 11-25-1940 DOA: 11/22/2022  PCP: Jackelyn Poling, DO   Brief Narrative:  This 82 y.o. female with medical history significant of AAA, CAD s/p CABG, chronic fatigue/chronic pain/fibromyalgia, HTN, HLD, OSA, and UC presenting with CP/SOB.   She had been feeling bad and then started having bad chest pain/pressure. She was also found to have heme positive stools, severe anemia requiring prbc transfusions. GI consulted and she is scheduled for EGD and colonoscopy on 4/23. Cardiology on board.  EGD showed mild ulcerative colitis but is stable from last scope.  Single nonbleeding colon lesion.  Diverticulosis in the sigmoid colon.  H&H remains stable no further GI workup.  Cardiology recommended outpatient workup.   Assessment & Plan:   Principal Problem:   Acute gastrointestinal bleeding Active Problems:   OSA (obstructive sleep apnea)   Chronic fatigue syndrome with fibromyalgia   Hyperlipidemia   Hypertension   Symptomatic anemia   A-fib   Non-ST elevation (NSTEMI) myocardial infarction   Chronic combined systolic (congestive) and diastolic (congestive) heart failure   Chronic kidney disease, stage 3b   DNR (do not resuscitate)   Demand ischemia  Assessment and plan:  Acute blood loss anemia /microcytic anemia : Patient presented with melena, hemoglobin 6.0 on arrival. S/p 3 units of prbc transfusion, repeat H&H is around 8.6.  Last colonoscopy was on 05/2019.  GI is consulted. Continue with IV protonix BID.  Anemia panel reviewed.  Underwent EGD 4/23 which showed mild ulcerative colitis, stable from last scope. Single nonbleeding colon lesion. H&H remains stable.  No further GI workup during this hospitalization. Eliquis resumed. Continue to monitor H/H > Hb 8.6   Chest pain: In the setting of CAD s/p CABG and PCI in the past.  Elevated troponins from demand ischemia from anemia  Cardiology on board.  Echocardiogram  showed preserved LVEF. Cardiology planning outpatient ischemic evaluation.   Chest pain seems related to stress.    Paroxysmal atrial fibrillation: Rate controlled with Toprol XL.  Referral to Dr. Ranae Palms for Watchman device. Eliquis resumed on 11/26/22   Stage 3 B CKD Creatinine appears to be at baseline.  Creatinine at 1.35.     Hypertension:  BP parameters are optimal.  Continue Toprol 50 mg daily   OSA ON CPAP at home.     Hyperlipidemia Resume statin.     Chronic pain syndrome / fibromyalgia:  Resume home meds.  Continue with Celexa, robaxin .    Mild leukocytosis and thrombocytopenia:  Monitor and repeat cbc in am.      Estimated body mass index is 34.06 kg/m as calculated from the following:   Height as of this encounter: 5\' 4"  (1.626 m).   Weight as of this encounter: 90 kg.  Acute hypoxic respiratory failure: Likely in the setting of anemia.  Continue supplemental oxygen and wean as tolerated. Check oxygen saturations while ambulation to see if she qualifies for oxygen.  Hypokalemia/hypomagnesemia: Replaced.  Continue to monitor  DVT prophylaxis: SCDs Code Status: DNR Family Communication: Daughter at bed side. Disposition Plan:     Status is: Inpatient Remains inpatient appropriate because: GI evaluation, shortness of breath, resumption of Eliquis    Consultants:  Gastroenterology Cardiology  Procedures: EGD Antimicrobials:   Anti-infectives (From admission, onward)    None      Subjective: Patient was seen and examined at bedside.  Overnight events noted. She continued to feel short of breath while walking and talking. Patient reports  not feeling well and wants to be discharged tomorrow.  Objective: Vitals:   11/26/22 0813 11/26/22 1528 11/27/22 0318 11/27/22 0804  BP: (!) 138/59 (!) 145/51 (!) 152/52 (!) 153/71  Pulse: (!) 104 77 81 90  Resp: Temp:  98.8 F (37.1 C) 98 F (36.7 C) 97.7 F (36.5 C)  TempSrc:   Oral Oral Oral  SpO2: 93% 91% 92% 94%  Weight:      Height:        Intake/Output Summary (Last 24 hours) at 11/27/2022 1357 Last data filed at 11/27/2022 0743 Gross per 24 hour  Intake 47.26 ml  Output 400 ml  Net -352.74 ml   Filed Weights   11/23/22 0325 11/25/22 1230  Weight: 90 kg 88 kg    Examination:  General exam: Appears comfortable, deconditioned, not in any distress. Respiratory system: Respiratory effort normal, CTA bilaterally, no respiratory muscle use, RR 12 Cardiovascular system: S1 & S2 heard, Irregular rhythm, no murmur. Gastrointestinal system: Abdomen is soft, non tender, non distended, BS+ Central nervous system: Alert and oriented x 3. No focal neurological deficits. Extremities: No edema, no cyanosis, no clubbing Skin: No rashes, lesions or ulcers Psychiatry: Judgement and insight appear normal. Mood & affect appropriate.     Data Reviewed: I have personally reviewed following labs and imaging studies  CBC: Recent Labs  Lab 11/23/22 0552 11/23/22 1428 11/24/22 0225 11/24/22 1802 11/25/22 0301 11/26/22 1050 11/27/22 0326 11/27/22 1127  WBC 10.0 9.8 8.9  --  11.1* 9.7 9.2  --   NEUTROABS 7.1  --   --   --  7.7  --   --   --   HGB 7.9* 7.9* 7.4* 8.9* 8.6* 8.6* 7.8* 8.3*  HCT 26.4* 26.3* 25.4* 29.9* 28.5* 28.5* 26.1* 27.9*  MCV 68.8* 66.8* 68.6*  --  69.3* 71.1* 70.2*  --   PLT 198 196 171  --  138* 167 172  --    Basic Metabolic Panel: Recent Labs  Lab 11/23/22 0552 11/24/22 0225 11/25/22 0301 11/26/22 1050 11/27/22 0326  NA 138 138 138 137 138  K 4.4 4.0 3.8 3.7 3.2*  CL 109 105 107 104 103  CO2 23 21* GLUCOSE 114* 104* 106* 122* 109*  BUN CREATININE 1.22* 1.29* 1.35* 1.60* 1.47*  CALCIUM 8.6* 8.6* 8.3* 8.4* 8.3*  MG 1.6*  --   --   --  1.6*  PHOS  --   --   --   --  3.9   GFR: Estimated Creatinine Clearance: 31.1 mL/min (A) (by C-G formula based on SCr of 1.47 mg/dL (H)). Liver Function Tests: Recent  Labs  Lab 11/23/22 0000 11/23/22 0552  AST 20 21  ALT 11 13  ALKPHOS 32* 29*  BILITOT <0.1* 1.1  PROT 6.0* 5.7*  ALBUMIN 3.4* 3.3*   Recent Labs  Lab 11/23/22 0000  LIPASE 37   No results for input(s): "AMMONIA" in the last 168 hours. Coagulation Profile: No results for input(s): "INR", "PROTIME" in the last 168 hours. Cardiac Enzymes: No results for input(s): "CKTOTAL", "CKMB", "CKMBINDEX", "TROPONINI" in the last 168 hours. BNP (last 3 results) No results for input(s): "PROBNP" in the last 8760 hours. HbA1C: No results for input(s): "HGBA1C" in the last 72 hours. CBG: No results for input(s): "GLUCAP" in the last 168 hours. Lipid Profile: No results for input(s): "CHOL", "HDL", "LDLCALC", "TRIG", "CHOLHDL", "LDLDIRECT" in the last 72 hours. Thyroid  Function Tests: No results for input(s): "TSH", "T4TOTAL", "FREET4", "T3FREE", "THYROIDAB" in the last 72 hours. Anemia Panel: Recent Labs    11/24/22 1802  VITAMINB12 381  FERRITIN 7*  TIBC 479*  IRON 73   Sepsis Labs: No results for input(s): "PROCALCITON", "LATICACIDVEN" in the last 168 hours.  No results found for this or any previous visit (from the past 240 hour(s)).   Radiology Studies: No results found.  Scheduled Meds:  sodium chloride   Intravenous Once   amLODipine  5 mg Oral Daily   apixaban  5 mg Oral BID   atorvastatin  20 mg Oral Daily   citalopram  20 mg Oral Daily   metoprolol succinate  50 mg Oral Daily   pantoprazole  40 mg Intravenous Q12H   sodium chloride flush  3 mL Intravenous Q12H   Continuous Infusions:     LOS: 4 days    Time spent: 35 mins    Willeen Niece, MD [facility]   If 7PM-7AM, please contact night-coverage

## 2022-11-28 DIAGNOSIS — K922 Gastrointestinal hemorrhage, unspecified: Secondary | ICD-10-CM | POA: Diagnosis not present

## 2022-11-28 LAB — BASIC METABOLIC PANEL
Anion gap: 13 (ref 5–15)
BUN: 16 mg/dL (ref 8–23)
CO2: 26 mmol/L (ref 22–32)
Calcium: 8.5 mg/dL — ABNORMAL LOW (ref 8.9–10.3)
Chloride: 100 mmol/L (ref 98–111)
Creatinine, Ser: 1.74 mg/dL — ABNORMAL HIGH (ref 0.44–1.00)
GFR, Estimated: 29 mL/min — ABNORMAL LOW (ref >60)
Glucose, Bld: 104 mg/dL — ABNORMAL HIGH (ref 70–99)
Potassium: 3.4 mmol/L — ABNORMAL LOW (ref 3.5–5.1)
Sodium: 139 mmol/L (ref 135–145)

## 2022-11-28 LAB — CBC
HCT: 28.9 % — ABNORMAL LOW (ref 36.0–46.0)
Hemoglobin: 8.3 g/dL — ABNORMAL LOW (ref 12.0–15.0)
MCH: 20.9 pg — ABNORMAL LOW (ref 26.0–34.0)
MCHC: 28.7 g/dL — ABNORMAL LOW (ref 30.0–36.0)
MCV: 72.8 fL — ABNORMAL LOW (ref 80.0–100.0)
Platelets: 157 10*3/uL (ref 150–400)
RBC: 3.97 MIL/uL (ref 3.87–5.11)
RDW: 23.1 % — ABNORMAL HIGH (ref 11.5–15.5)
WBC: 10.3 10*3/uL (ref 4.0–10.5)
nRBC: 0 % (ref 0.0–0.2)

## 2022-11-28 LAB — MAGNESIUM: Magnesium: 1.6 mg/dL — ABNORMAL LOW (ref 1.7–2.4)

## 2022-11-28 LAB — PHOSPHORUS: Phosphorus: 3.8 mg/dL (ref 2.5–4.6)

## 2022-11-28 MED ORDER — POTASSIUM PHOSPHATES 15 MMOLE/5ML IV SOLN
30.0000 mmol | Freq: Once | INTRAVENOUS | Status: AC
  Administered 2022-11-28: 30 mmol via INTRAVENOUS
  Filled 2022-11-28: qty 10

## 2022-11-28 MED ORDER — APIXABAN 2.5 MG PO TABS: 2.5000 mg | ORAL_TABLET | Freq: Two times a day (BID) | ORAL | 11 refills | Status: DC

## 2022-11-28 MED ORDER — MAGNESIUM SULFATE 2 GM/50ML IV SOLN
2.0000 g | Freq: Once | INTRAVENOUS | Status: AC
  Administered 2022-11-28: 2 g via INTRAVENOUS
  Filled 2022-11-28: qty 50

## 2022-11-28 MED ORDER — APIXABAN 2.5 MG PO TABS
2.5000 mg | ORAL_TABLET | Freq: Two times a day (BID) | ORAL | Status: DC
  Administered 2022-11-28: 2.5 mg via ORAL
  Filled 2022-11-28: qty 1

## 2022-11-28 NOTE — Progress Notes (Addendum)
Mobility Specialist Progress Note:   11/28/22 1115  Mobility  Activity Ambulated with assistance in hallway  Level of Assistance Standby assist, set-up cues, supervision of patient - no hands on  Assistive Device Other (Comment) (IV Pole)  Distance Ambulated (ft) 180 ft  Activity Response Tolerated well  Mobility Referral Yes  $Mobility charge 1 Mobility   Pt agreeable to mobility session. Required no physical assistance throughout ambulation. SpO2 87% on RA< required 1LO2 to maintain SpO2 WFL. Pt back sitting EOB with all needs met.   Addison Lank Mobility Specialist Please contact via SecureChat or  Rehab office at [phone]

## 2022-11-28 NOTE — TOC Transition Note (Signed)
Transition of Care [facility]) - CM/SW Discharge Note   Patient Details  Name: Kara Bush MRN: [id number] Date of Birth: 01/17/1941  [facility] CM/SW Contact:  Gala Lewandowsky, RN Phone Number: 11/28/2022, 11:52 AM   Clinical Narrative:  Patient plans for transition home today. Patient in need for oxygen- spoke with daughter yesterday and the preference is for [employer] for DME. Referral submitted to [employer] and DME will be delivered to the room prior to transition home. No further needs identified at this time.    Final next level of care: Home w Home Health Services Barriers to Discharge: No Barriers Identified  Patient Goals and CMS Choice   Choice offered to / list presented to :  (discussed Medicare.gov list over the phone.)  Discharge Plan and Services Additional resources added to the After Visit Summary for   In-house Referral: NA Discharge Planning Services: CM Consult Post Acute Care Choice: Home Health          DME Arranged: Oxygen DME Agency: [facility] Date DME Agency Contacted: 11/28/22 Time DME Agency Contacted: 1152 Representative spoke with at DME Agency: Barbara Cower HH Arranged: PT HH Agency: [facility] Date [facility] Agency Contacted: 11/27/22 Time HH Agency Contacted: 1633 Representative spoke with at [facility] Agency: Kandee Keen  Social Determinants of Health (SDOH) Interventions SDOH Screenings   Food Insecurity: No Food Insecurity (11/23/2022)  Housing: Low Risk  (11/23/2022)  Transportation Needs: No Transportation Needs (11/23/2022)  Utilities: Not At Risk (11/23/2022)  Depression (PHQ2-9): Low Risk  (10/17/2022)  Tobacco Use: Medium Risk (11/25/2022)   Readmission Risk Interventions     No data to display

## 2022-11-28 NOTE — Progress Notes (Signed)
Nurse requested Mobility Specialist to perform oxygen saturation test with pt which includes removing pt from oxygen both at rest and while ambulating.  Below are the results from that testing.     Patient Saturations on Room Air at Rest = spO2 93%  Patient Saturations on Room Air while Ambulating = sp02 87% .  Patient Saturations on 1 Liters of oxygen while Ambulating = sp02 93%  At end of testing pt left in room on 1  Liters of oxygen.  Reported results to nurse. Kara Bush Mobility Specialist Please contact via SecureChat or  Rehab office at [phone]

## 2022-11-28 NOTE — Care Management (Signed)
SATURATION QUALIFICATIONS: (This note is used to comply with regulatory documentation for home oxygen)  Patient Saturations on Room Air at Rest = 93%  Patient Saturations on Room Air while Ambulating = 87%  Patient Saturations on 1 Liters of oxygen while Ambulating = 93%  Please briefly explain why patient needs home oxygen: Oxygen saturation drops on ambulation. 

## 2022-11-28 NOTE — Progress Notes (Signed)
SATURATION QUALIFICATIONS: (This note is used to comply with regulatory documentation for home oxygen)  Patient Saturations on Room Air at Rest = 93%  Patient Saturations on Room Air while Ambulating = 87%  Patient Saturations on 1 Liters of oxygen while Ambulating = 93%  Please briefly explain why patient needs home oxygen: Oxygen saturation drops on ambulation.

## 2022-11-28 NOTE — Care Management (Cosign Needed)
    Durable Medical Equipment  (From admission, onward)           Start     Ordered   11/28/22 1150  DME Oxygen  Once       Question Answer Comment  Length of Need Lifetime   Mode or (Route) Nasal cannula   Liters per Minute 1   Frequency Continuous (stationary and portable oxygen unit needed)   Oxygen conserving device Yes   Oxygen delivery system Gas      11/28/22 1149

## 2022-11-30 ENCOUNTER — Encounter (HOSPITAL_COMMUNITY): Payer: Self-pay | Admitting: Gastroenterology

## 2022-12-01 DIAGNOSIS — G4733 Obstructive sleep apnea (adult) (pediatric): Secondary | ICD-10-CM | POA: Diagnosis not present

## 2022-12-01 DIAGNOSIS — I5042 Chronic combined systolic (congestive) and diastolic (congestive) heart failure: Secondary | ICD-10-CM | POA: Diagnosis not present

## 2022-12-01 DIAGNOSIS — I214 Non-ST elevation (NSTEMI) myocardial infarction: Secondary | ICD-10-CM | POA: Diagnosis not present

## 2022-12-01 DIAGNOSIS — I2489 Other forms of acute ischemic heart disease: Secondary | ICD-10-CM | POA: Diagnosis not present

## 2022-12-01 NOTE — Telephone Encounter (Signed)
Spoke with the patient on the phone. She declined to schedule Watchman consult at this time.  She will think about it, see her cardiologist soon, and call back if she changes her mind.

## 2022-12-03 NOTE — Addendum Note (Signed)
Addended by: Duane Boston on: 12/03/2022 03:31 PM   Modules accepted: Orders

## 2022-12-04 DIAGNOSIS — E876 Hypokalemia: Secondary | ICD-10-CM | POA: Diagnosis not present

## 2022-12-04 DIAGNOSIS — Z6834 Body mass index (BMI) 34.0-34.9, adult: Secondary | ICD-10-CM | POA: Diagnosis not present

## 2022-12-04 DIAGNOSIS — I130 Hypertensive heart and chronic kidney disease with heart failure and stage 1 through stage 4 chronic kidney disease, or unspecified chronic kidney disease: Secondary | ICD-10-CM | POA: Diagnosis not present

## 2022-12-04 DIAGNOSIS — I5032 Chronic diastolic (congestive) heart failure: Secondary | ICD-10-CM | POA: Diagnosis not present

## 2022-12-04 DIAGNOSIS — Z09 Encounter for follow-up examination after completed treatment for conditions other than malignant neoplasm: Secondary | ICD-10-CM | POA: Diagnosis not present

## 2022-12-04 DIAGNOSIS — J9601 Acute respiratory failure with hypoxia: Secondary | ICD-10-CM | POA: Diagnosis not present

## 2022-12-04 DIAGNOSIS — I700 Atherosclerosis of aorta: Secondary | ICD-10-CM | POA: Diagnosis not present

## 2022-12-04 DIAGNOSIS — I4891 Unspecified atrial fibrillation: Secondary | ICD-10-CM | POA: Diagnosis not present

## 2022-12-04 DIAGNOSIS — I10 Essential (primary) hypertension: Secondary | ICD-10-CM | POA: Diagnosis not present

## 2022-12-04 DIAGNOSIS — D509 Iron deficiency anemia, unspecified: Secondary | ICD-10-CM | POA: Diagnosis not present

## 2022-12-04 DIAGNOSIS — Z9981 Dependence on supplemental oxygen: Secondary | ICD-10-CM | POA: Diagnosis not present

## 2022-12-04 DIAGNOSIS — D6869 Other thrombophilia: Secondary | ICD-10-CM | POA: Diagnosis not present

## 2022-12-04 DIAGNOSIS — N1832 Chronic kidney disease, stage 3b: Secondary | ICD-10-CM | POA: Diagnosis not present

## 2022-12-08 DIAGNOSIS — E876 Hypokalemia: Secondary | ICD-10-CM | POA: Diagnosis not present

## 2022-12-11 ENCOUNTER — Encounter: Payer: Self-pay | Admitting: Registered Nurse

## 2022-12-11 ENCOUNTER — Encounter: Payer: PPO | Attending: Registered Nurse | Admitting: Registered Nurse

## 2022-12-11 VITALS — BP 159/75 | HR 85 | Ht 64.0 in | Wt 195.0 lb

## 2022-12-11 DIAGNOSIS — M25562 Pain in left knee: Secondary | ICD-10-CM | POA: Diagnosis not present

## 2022-12-11 DIAGNOSIS — M7062 Trochanteric bursitis, left hip: Secondary | ICD-10-CM | POA: Diagnosis not present

## 2022-12-11 DIAGNOSIS — M7061 Trochanteric bursitis, right hip: Secondary | ICD-10-CM

## 2022-12-11 DIAGNOSIS — M545 Low back pain, unspecified: Secondary | ICD-10-CM | POA: Diagnosis not present

## 2022-12-11 DIAGNOSIS — M25561 Pain in right knee: Secondary | ICD-10-CM | POA: Diagnosis not present

## 2022-12-11 DIAGNOSIS — G894 Chronic pain syndrome: Secondary | ICD-10-CM | POA: Diagnosis not present

## 2022-12-11 DIAGNOSIS — D509 Iron deficiency anemia, unspecified: Secondary | ICD-10-CM | POA: Diagnosis not present

## 2022-12-11 DIAGNOSIS — M797 Fibromyalgia: Secondary | ICD-10-CM | POA: Diagnosis not present

## 2022-12-11 DIAGNOSIS — I10 Essential (primary) hypertension: Secondary | ICD-10-CM | POA: Diagnosis not present

## 2022-12-11 DIAGNOSIS — Z79891 Long term (current) use of opiate analgesic: Secondary | ICD-10-CM | POA: Diagnosis not present

## 2022-12-11 DIAGNOSIS — K5190 Ulcerative colitis, unspecified, without complications: Secondary | ICD-10-CM | POA: Diagnosis not present

## 2022-12-11 DIAGNOSIS — F3340 Major depressive disorder, recurrent, in remission, unspecified: Secondary | ICD-10-CM | POA: Diagnosis not present

## 2022-12-11 DIAGNOSIS — Z5181 Encounter for therapeutic drug level monitoring: Secondary | ICD-10-CM | POA: Insufficient documentation

## 2022-12-11 DIAGNOSIS — Z0000 Encounter for general adult medical examination without abnormal findings: Secondary | ICD-10-CM | POA: Diagnosis not present

## 2022-12-11 DIAGNOSIS — E876 Hypokalemia: Secondary | ICD-10-CM | POA: Diagnosis not present

## 2022-12-11 DIAGNOSIS — G8929 Other chronic pain: Secondary | ICD-10-CM | POA: Insufficient documentation

## 2022-12-11 DIAGNOSIS — Z23 Encounter for immunization: Secondary | ICD-10-CM | POA: Diagnosis not present

## 2022-12-11 MED ORDER — HYDROCODONE-ACETAMINOPHEN 5-325 MG PO TABS
1.0000 | ORAL_TABLET | Freq: Four times a day (QID) | ORAL | 0 refills | Status: DC | PRN
Start: 2022-12-11 — End: 2022-12-11

## 2022-12-11 MED ORDER — HYDROCODONE-ACETAMINOPHEN 5-325 MG PO TABS
1.0000 | ORAL_TABLET | Freq: Four times a day (QID) | ORAL | 0 refills | Status: DC | PRN
Start: 2022-12-11 — End: 2023-02-25

## 2022-12-11 NOTE — Progress Notes (Signed)
Subjective:    Patient ID: Kara Bush, female    DOB: Aug 17, 1940, 82 y.o.   MRN: [id number]  HPI: Kara Bush is a 82 y.o. female who returns for follow up appointment for chronic pain and medication refill. She states her pain is located in her mid- lower back, bilateral hips and bilateral knee pain. She rates her pain 6. Her current exercise regime is walking and performing stretching exercises.  Kara Bush was seen in [facility] ED on 11/22/2022, for chest pain and SOB, she was brought in by EMS. She was admitted with acute gastrointestinal bleeding, discharge summary was reviewed. She was discharged on 11/28/2022  Kara Bush arrived hypertensive, blood pressure was re-checked. She is compliant with her anti-hypertensive medication. She will keep blood pressure log and F/U with her PCP/ Cardiologist.   Kara Bush Morphine equivalent is 25.00 MME.   Last UDS was Performed on 10/17/2022, it was consistent.    Pain Inventory Average Pain 5 Pain Right Now 6 My pain is aching  In the last 24 hours, has pain interfered with the following? General activity 5 Relation with others 4 Enjoyment of life 4 What TIME of day is your pain at its worst? morning  Sleep (in general) Fair  Pain is worse with: walking, bending, and some activites Pain improves with: rest and medication Relief from Meds: 5  Family History  Problem Relation Age of Onset   Heart disease Father    Heart attack Father        @ 38   Cancer Paternal Aunt        BREAST   Heart failure Mother        at 50   Healthy Sister    Cancer Brother        pancreatic    Heart failure Maternal Grandmother    Heart attack Maternal Grandfather    Heart attack Paternal Grandmother        at 74   Healthy Daughter    Healthy Daughter    Social History   Socioeconomic History   Marital status: Divorced    Spouse name: Not on file   Number of children: 2   Years of education: college    Highest  education level: Not on file  Occupational History    Employer: RETIRED  Tobacco Use   Smoking status: Former    Packs/day: 2.50    Years: 20.00    Additional pack years: 0.00    Total pack years: 50.00    Types: Cigarettes    Quit date: 06/29/1990    Years since quitting: 32.4    Passive exposure: Never   Smokeless tobacco: Never  Vaping Use   Vaping Use: Never used  Substance and Sexual Activity   Alcohol use: No   Drug use: No   Sexual activity: Not on file  Other Topics Concern   Not on file  Social History Narrative   Not on file   Social Determinants of Health   Financial Resource Strain: Not on file  Food Insecurity: No Food Insecurity (11/23/2022)   Hunger Vital Sign    Worried About Running Out of Food in the Last Year: Never true    Ran Out of Food in the Last Year: Never true  Transportation Needs: No Transportation Needs (11/23/2022)   PRAPARE - Administrator, Civil Service (Medical): No    Lack of Transportation (Non-Medical): No  Physical Activity: Not on file  Stress: Not on file  Social Connections: Not on file   Past Surgical History:  Procedure Laterality Date   AORTOBIEXTERNAL ILIAC BYPASS  1991   BIOPSY  11/25/2022   Procedure: BIOPSY;  Surgeon: Jeani Hawking, MD;  Location: [facility] ENDOSCOPY;  Service: Gastroenterology;;   CATARACT EXTRACTION Left 09/2019   CHOLECYSTECTOMY  1997   COLONOSCOPY WITH PROPOFOL N/A 11/25/2022   Procedure: COLONOSCOPY WITH PROPOFOL;  Surgeon: Jeani Hawking, MD;  Location: [facility] ENDOSCOPY;  Service: Gastroenterology;  Laterality: N/A;   CORONARY ANGIOPLASTY WITH STENT PLACEMENT  2006   to LAD, ATRETIC LIMA AT THAT TIME ALSO   CORONARY ANGIOPLASTY WITH STENT PLACEMENT  2007   STENT TO PROX VG-DIAG, OTHER STENTS PATENT   CORONARY ARTERY BYPASS GRAFT  1998   LIMA-LAD;VG-DIAG & RCA   CORONARY STENT PLACEMENT  2005   TO LAD & LCX-STENTS,   ESOPHAGOGASTRODUODENOSCOPY (EGD) WITH PROPOFOL N/A 11/25/2022   Procedure:  ESOPHAGOGASTRODUODENOSCOPY (EGD) WITH PROPOFOL;  Surgeon: Jeani Hawking, MD;  Location: [facility] ENDOSCOPY;  Service: Gastroenterology;  Laterality: N/A;   HERNIA REPAIR     TUBAL LIGATION     BTL   Past Surgical History:  Procedure Laterality Date   AORTOBIEXTERNAL ILIAC BYPASS  1991   BIOPSY  11/25/2022   Procedure: BIOPSY;  Surgeon: Jeani Hawking, MD;  Location: [facility] ENDOSCOPY;  Service: Gastroenterology;;   CATARACT EXTRACTION Left 09/2019   CHOLECYSTECTOMY  1997   COLONOSCOPY WITH PROPOFOL N/A 11/25/2022   Procedure: COLONOSCOPY WITH PROPOFOL;  Surgeon: Jeani Hawking, MD;  Location: [facility] ENDOSCOPY;  Service: Gastroenterology;  Laterality: N/A;   CORONARY ANGIOPLASTY WITH STENT PLACEMENT  2006   to LAD, ATRETIC LIMA AT THAT TIME ALSO   CORONARY ANGIOPLASTY WITH STENT PLACEMENT  2007   STENT TO PROX VG-DIAG, OTHER STENTS PATENT   CORONARY ARTERY BYPASS GRAFT  1998   LIMA-LAD;VG-DIAG & RCA   CORONARY STENT PLACEMENT  2005   TO LAD & LCX-STENTS,   ESOPHAGOGASTRODUODENOSCOPY (EGD) WITH PROPOFOL N/A 11/25/2022   Procedure: ESOPHAGOGASTRODUODENOSCOPY (EGD) WITH PROPOFOL;  Surgeon: Jeani Hawking, MD;  Location: [facility] ENDOSCOPY;  Service: Gastroenterology;  Laterality: N/A;   HERNIA REPAIR     TUBAL LIGATION     BTL   Past Medical History:  Diagnosis Date   Abdominal aortic aneurysm (HCC)    Anemia, improved 01/11/2013   Basal cell carcinoma    CAD (coronary artery disease)    hx CABG 1998, last cath 2007 with PCI and stenting to the proximal segment of the vein graft to the diagonal branch. DES Taxus was placed   Cervicalgia    Chronic fatigue fibromyalgia syndrome    Chronic pain syndrome    Depression    Fibromyalgia    History of nuclear stress test 08/07/2011   lexiscan; no evidence of ischemia or infarct; low risk    Hyperlipidemia    Hypertension    OSA (obstructive sleep apnea)    PAD (peripheral artery disease) (HCC)    Pes anserine bursitis    Pulmonary embolism (HCC) 01/11/2013    S/P resection of aortic aneurysm    Trochanteric bursitis    Ulcerative colitis (HCC)    Pulse 85   Ht 5\' 4"  (1.626 m)   Wt 195 lb (88.5 kg)   SpO2 94%   BMI 33.47 kg/m   Opioid Risk Score:   Fall Risk Score:  `1  Depression screen [facility] 2/9     10/17/2022    1:38 PM 08/18/2022    1:15 PM  06/23/2022    1:03 PM 02/19/2022    1:40 PM 10/21/2021   11:08 AM 06/24/2021   10:48 AM 04/23/2021   10:41 AM  Depression screen PHQ 2/9  Decreased Interest 0 0 1 0 0 0 0  Down, Depressed, Hopeless 0 0 1 0 0 0 0  PHQ - 2 Score 0 0 2 0 0 0 0      Review of Systems  Musculoskeletal:  Positive for back pain.  All other systems reviewed and are negative.     Objective:   Physical Exam Vitals and nursing note reviewed.  Constitutional:      Appearance: Normal appearance.  Cardiovascular:     Rate and Rhythm: Normal rate and regular rhythm.     Pulses: Normal pulses.     Heart sounds: Normal heart sounds.  Pulmonary:     Effort: Pulmonary effort is normal.     Breath sounds: Normal breath sounds.  Musculoskeletal:     Cervical back: Normal range of motion and neck supple.     Comments: Normal Muscle Bulk and Muscle Testing Reveals:  Upper Extremities: Full ROM and Muscle Strength 5/5 Thoracic Paraspinal Tenderness: T-7-T-9 Lumbar Paraspinal Tenderness: L-4-L-5 Bilateral Greater Trochanter Tenderness Lower Extremities: Full ROM and Muscle strength 5/5 Arises from Table slowly Narrow Based  Gait     Skin:    General: Skin is warm and dry.  Neurological:     Mental Status: She is alert and oriented to person, place, and time.  Psychiatric:        Mood and Affect: Mood normal.        Behavior: Behavior normal.         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 12/11/2022 2. Fibromyalgia: Continue  Home Exercise Program. Continue to  Monitor.12/11/2022 3. Bilateral intermittent hand numbness: Carpal tunnel syndrome versus C6 radiculopathy mild: No complaints today. Continue to Monitor. 12/11/2022 4. Chronic Midline Low Back Pain/ LBP: Refilled: Norco 5/325mg  # 140 pills--use one pill every 6 hours as needed for pain. A second prescription was e-scribe for the following month 12/11/2022 We will continue the opioid monitoring program, this consists of regular clinic visits, examinations, urine drug screen, pill counts as well as use of [place] Controlled Substance Reporting system. A 12 month History has been reviewed on the [place] Controlled Substance Reporting System on 12/11/2022. 5. Muscle Spasm: Continue current medication regimen with  Robaxin. 12/11/2022 6.Bilateral  Greater Trochanteric Bursitis:Continue to Alternate with heat and ice therapy. Continue current medication regime. 12/11/2022  7. Left Foot pain: No complaints today.Continue with HEP as Tolerated. Continue to Monitor. 12/11/2022 8. Right Lateral Epicondylitis Pain: No complaints Today. Continue HEP as Tolerated. Continue to Monitor. 12/11/2022 9. Right Shoulder Tendonitis:  No complaints today. Continue Current Medication Regimen. Continue to Alternate Ice and Heat Therapy. 12/11/2022 10 Bilateral Chronic Knee Pain: .  Continue HEP as Tolerated. Continue to Monitor. 12/11/2022   F/U in 2 months

## 2022-12-17 ENCOUNTER — Ambulatory Visit (INDEPENDENT_AMBULATORY_CARE_PROVIDER_SITE_OTHER): Payer: PPO | Admitting: Physician Assistant

## 2022-12-17 ENCOUNTER — Ambulatory Visit (HOSPITAL_COMMUNITY)
Admission: RE | Admit: 2022-12-17 | Discharge: 2022-12-17 | Disposition: A | Discharge status: Home / Self Care | Payer: PPO | Source: Ambulatory Visit | Attending: Vascular Surgery | Admitting: Vascular Surgery

## 2022-12-17 VITALS — BP 177/82 | HR 94 | Temp 97.6°F | Wt 188.0 lb

## 2022-12-17 DIAGNOSIS — I70213 Atherosclerosis of native arteries of extremities with intermittent claudication, bilateral legs: Secondary | ICD-10-CM

## 2022-12-17 DIAGNOSIS — R110 Nausea: Secondary | ICD-10-CM | POA: Diagnosis not present

## 2022-12-17 DIAGNOSIS — K449 Diaphragmatic hernia without obstruction or gangrene: Secondary | ICD-10-CM | POA: Diagnosis not present

## 2022-12-17 DIAGNOSIS — K625 Hemorrhage of anus and rectum: Secondary | ICD-10-CM | POA: Diagnosis not present

## 2022-12-17 NOTE — Progress Notes (Addendum)
Office Note   History of Present Illness   Kara Bush is a 82 y.o. (04/15/1941) female who presents for surveillance of PAD.  She has a remote history of aorto biiliac bypass.  In 2023 she had recurrence of bilateral lower extremity claudication.  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  Since the patient is high risk for any repeat procedure and is without significantly limiting symptoms, we will continue surveillance.  She returns today for follow-up.  She still has bilateral lower extremity claudication, but this has not worsened since last year.  She states that some days are better than others in terms of claudication.  She can usually walk around her house without issue.  She denies any rest pain or tissue loss. She is reporting some recent abdominal issues, including nausea with occasional emesis.  She denies any food fear, unintentional weight loss, or postprandial pain.  Current Outpatient Medications  Medication Sig Dispense Refill   amLODipine (NORVASC) 5 MG tablet TAKE ONE TABLET BY MOUTH ONCE daily 90 tablet 3   apixaban (ELIQUIS) 2.5 MG TABS tablet Take 1 tablet (2.5 mg total) by mouth 2 (two) times daily. 60 tablet 11   atorvastatin (LIPITOR) 20 MG tablet Take 1 tablet (20 mg total) by mouth daily. 30 tablet 1   b complex vitamins capsule Take 1 capsule by mouth daily.     betamethasone dipropionate 0.05 % cream Apply 1 Application topically daily.     citalopram (CELEXA) 20 MG tablet Take 1 tablet by mouth daily.     clobetasol ointment (TEMOVATE) 0.05 % Apply 1 Application topically 2 (two) times daily.     ezetimibe (ZETIA) 10 MG tablet TAKE ONE TABLET BY MOUTH ONCE daily 90 tablet 3   furosemide (LASIX) 40 MG tablet Take 1 tablet (40 mg total) by mouth 3 (three) times a week. Increase to every day as needed for weight gain of 2lbs overnight or 5lbs in 1 week 90 tablet 1    HYDROcodone-acetaminophen (NORCO/VICODIN) 5-325 MG tablet Take 1 tablet by mouth 4 (four) times daily as needed for moderate pain. May Take an extra 0.5 to one tablet when pain is severe. 140 tablet 0   methocarbamol (ROBAXIN) 500 MG tablet TAKE 1 TABLET BY MOUTH EVERY 8 HOURS AS NEEDED FOR MUSCLE SPASM (Patient taking differently: Take 500 mg by mouth every 8 (eight) hours as needed for muscle spasms.) 30 tablet 1   metoprolol succinate (TOPROL-XL) 50 MG 24 hr tablet TAKE ONE TABLET BY MOUTH ONCE daily (Patient taking differently: Take 50 mg by mouth daily.) 90 tablet 1   Multiple Vitamin (MULITIVITAMIN WITH MINERALS) TABS Take 1 tablet by mouth daily.     potassium chloride (KLOR-CON M) 10 MEQ tablet Take 1 tablet (10 mEq total) by mouth 3 (three) times a week. And with extra lasix as needed. 90 tablet 3   zolpidem (AMBIEN CR) 12.5 MG CR tablet Take 12.5 mg by mouth at bedtime as needed.     No current facility-administered medications for this visit.    REVIEW OF SYSTEMS (negative unless checked):   Cardiac:  []  Chest pain or chest pressure? []  Shortness of breath upon activity? []  Shortness of breath when lying flat? []  Irregular heart rhythm?  Vascular:  [x]  Pain in calf, thigh, or hip brought on by walking? []  Pain in feet at night that wakes you up from your sleep? []  Blood  clot in your veins? []  Leg swelling?  Pulmonary:  []  Oxygen at home? []  Productive cough? []  Wheezing?  Neurologic:  []  Sudden weakness in arms or legs? []  Sudden numbness in arms or legs? []  Sudden onset of difficult speaking or slurred speech? []  Temporary loss of vision in one eye? []  Problems with dizziness?  Gastrointestinal:  []  Blood in stool? []  Vomited blood?  Genitourinary:  []  Burning when urinating? []  Blood in urine?  Psychiatric:  []  Major depression  Hematologic:  []  Bleeding problems? []  Problems with blood clotting?  Dermatologic:  []  Rashes or ulcers?  Constitutional:   []  Fever or chills?  Ear/Nose/Throat:  []  Change in hearing? []  Nose bleeds? []  Sore throat?  Musculoskeletal:  []  Back pain? []  Joint pain? []  Muscle pain?   Physical Examination   Vitals:   12/17/22 0846  BP: (!) 177/82  Pulse: 94  Temp: 97.6 F (36.4 C)  TempSrc: Temporal  SpO2: 95%  Weight: 188 lb (85.3 kg)   Body mass index is 32.27 kg/m.  General:  WDWN in NAD; vital signs documented above Gait: Not observed HENT: WNL, normocephalic Pulmonary: normal non-labored breathing  Cardiac: regular Abdomen: soft, NT, no masses Skin: without rashes Vascular Exam/Pulses: nonpalpable pedal pulses, brisk DP/PT doppler signals bilaterally Extremities: without ischemic changes, without gangrene , without cellulitis; without open wounds;  Musculoskeletal: no muscle wasting or atrophy  Neurologic: A&O X 3;  No focal weakness or paresthesias are detected Psychiatric:  The pt has Normal affect.  Non-Invasive Vascular imaging   Aortoiliac Duplex (12/17/2022) +-------------+-------+----------+----------+----------+--------+----------  ----+  Location    AP (cm)Trans (cm)PSV (cm/s)Waveform  ThrombusComments         +-------------+-------+----------+----------+----------+--------+----------  ----+  Proximal                     315                         > 50%  stenosis  +-------------+-------+----------+----------+----------+--------+----------  ----+  Mid                                                      NV               +-------------+-------+----------+----------+----------+--------+----------  ----+  Distal                                                   NV               +-------------+-------+----------+----------+----------+--------+----------  ----+  RT EIA Prox                   186       monophasic                         +-------------+-------+----------+----------+----------+--------+----------  ----+  RT EIA  Mid                    145       biphasic                           +-------------+-------+----------+----------+----------+--------+----------  ----+  RT EIA Distal                 122       biphasic                           +-------------+-------+----------+----------+----------+--------+----------  ----+  LT EIA Prox                   252       monophasic                         +-------------+-------+----------+----------+----------+--------+----------  ----+  LT EIA Mid                    127       monophasic                         +-------------+-------+----------+----------+----------+--------+----------  ----+  LT EIA Distal                 107       biphasic                           +-------------+-------+----------+----------+----------+--------+----------    Medical Decision Making   LEKITA DIBBLE is a 82 y.o. 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 She has no rest pain or tissue loss. She reports some abdominal issues including nausea and occasional emesis but no symptoms of chronic mesenteric ischemia including food fear, postprandial pain, or unintentional weight loss. She will be seeing GI today She can follow up with our office in 1 yr with bilateral aortoiliac duplex and ABIs   Loel Dubonnet PA-C [facility] Office: [phone]  Clinic MD: Randie Heinz

## 2022-12-18 ENCOUNTER — Telehealth: Payer: Self-pay | Admitting: Internal Medicine

## 2022-12-18 DIAGNOSIS — H02052 Trichiasis without entropian right lower eyelid: Secondary | ICD-10-CM | POA: Diagnosis not present

## 2022-12-18 NOTE — Telephone Encounter (Signed)
BP meds changed since d/c'd from the hospital and when she has gotten her BP checked at a few Dr appt's it has been elevated. She recently was in hospital for low Hg= 6, had 3 blood transfusions; she said that her Hg=9 today, so is moving in the right direction and she is feeling better than she was.  5/9: at [facility]      169/81  171/85 5/9 in afternoon 159/75- at Pain clinic  Today at eye Dr office = 155/73  She reports that her BP monitor is not working right, so has not been able to check it at home.   Before she went into hospital her BP was 130's/50-60's, but after she got out of the hospital it has been elevated.   She reports having some headaches/pressure- sometimes she will take tylenol for it. Pt reports that rest and tylenol makes it go away. She says that she is not experiencing any other s/s.   Told her that it would be helpful to have BP readings consistently. She said she will go buy one, they are not very expensive. Instructed to keep a daily log of BP. Check it 2 hours after taking BP medication- make sure you are sitting down and at rest for 10 minutes before checking it. She has a f/u appt 5/24 with Bernadene Person, NP. She will keep a log and bring it to her appt.  Informed her that I will send this to her provider and if they have any further recommendations, then we will get in touch with her. Given ER precautions. She verbalized understanding.

## 2022-12-18 NOTE — Telephone Encounter (Signed)
Pt c/o BP issue: STAT if pt c/o blurred vision, one-sided weakness or slurred speech  1. What are your last 5 BP readings?    155/73 at eye doctor's office today  2. Are you having any other symptoms (ex. Dizziness, headache, blurred vision, passed out)?   Patient states her "head is kind of tight"  3. What is your BP issue?   Patient is concerned her BP readings have been running high.  Patient stated her medication was changed when she was in the hospital.

## 2022-12-24 DIAGNOSIS — H35033 Hypertensive retinopathy, bilateral: Secondary | ICD-10-CM | POA: Diagnosis not present

## 2022-12-25 ENCOUNTER — Other Ambulatory Visit: Payer: Self-pay

## 2022-12-25 DIAGNOSIS — I70213 Atherosclerosis of native arteries of extremities with intermittent claudication, bilateral legs: Secondary | ICD-10-CM

## 2022-12-26 ENCOUNTER — Ambulatory Visit: Payer: PPO | Attending: Nurse Practitioner | Admitting: Nurse Practitioner

## 2022-12-26 ENCOUNTER — Encounter: Payer: Self-pay | Admitting: Nurse Practitioner

## 2022-12-26 VITALS — BP 130/62 | HR 96 | Ht 64.0 in | Wt 184.0 lb

## 2022-12-26 DIAGNOSIS — I2510 Atherosclerotic heart disease of native coronary artery without angina pectoris: Secondary | ICD-10-CM

## 2022-12-26 DIAGNOSIS — R7989 Other specified abnormal findings of blood chemistry: Secondary | ICD-10-CM

## 2022-12-26 DIAGNOSIS — I10 Essential (primary) hypertension: Secondary | ICD-10-CM

## 2022-12-26 DIAGNOSIS — D649 Anemia, unspecified: Secondary | ICD-10-CM | POA: Diagnosis not present

## 2022-12-26 DIAGNOSIS — I5032 Chronic diastolic (congestive) heart failure: Secondary | ICD-10-CM | POA: Diagnosis not present

## 2022-12-26 DIAGNOSIS — I359 Nonrheumatic aortic valve disorder, unspecified: Secondary | ICD-10-CM | POA: Diagnosis not present

## 2022-12-26 DIAGNOSIS — I480 Paroxysmal atrial fibrillation: Secondary | ICD-10-CM | POA: Diagnosis not present

## 2022-12-26 DIAGNOSIS — I712 Thoracic aortic aneurysm, without rupture, unspecified: Secondary | ICD-10-CM

## 2022-12-26 DIAGNOSIS — I739 Peripheral vascular disease, unspecified: Secondary | ICD-10-CM

## 2022-12-26 DIAGNOSIS — E785 Hyperlipidemia, unspecified: Secondary | ICD-10-CM

## 2022-12-26 MED ORDER — ISOSORBIDE MONONITRATE ER 30 MG PO TB24: 15.0000 mg | ORAL_TABLET | Freq: Every day | ORAL | 3 refills | Status: DC

## 2022-12-26 NOTE — Progress Notes (Signed)
Office Visit    Patient Name: Kara Bush Date of Encounter: 12/26/2022  Primary Care Provider:  Jackelyn Poling, DO Primary Cardiologist:  Chrystie Nose, MD  Chief Complaint    82 year old female with a history of CAD s/p CABG x 3  (LIMA-LAD, SVG-Diag, and SVG-RCA) in 1998 s/p multiple percutaneous interventions, paroxysmal atrial fibrillation on Eliquis, bicuspid aortic valve with mild to moderate regurgitation, chronic diastolic heart failure, PE, TIA, PAD s/p aortoiliac bypass, hypertension, hyperlipidemia, OSA, and anemia who presents for hospital follow-up related to CAD.  Past Medical History    Past Medical History:  Diagnosis Date   Abdominal aortic aneurysm (HCC)    Anemia, improved 01/11/2013   Basal cell carcinoma    CAD (coronary artery disease)    hx CABG 1998, last cath 2007 with PCI and stenting to the proximal segment of the vein graft to the diagonal branch. DES Taxus was placed   Cervicalgia    Chronic fatigue fibromyalgia syndrome    Chronic pain syndrome    Depression    Fibromyalgia    History of nuclear stress test 08/07/2011   lexiscan; no evidence of ischemia or infarct; low risk    Hyperlipidemia    Hypertension    OSA (obstructive sleep apnea)    PAD (peripheral artery disease) (HCC)    Pes anserine bursitis    Pulmonary embolism (HCC) 01/11/2013   S/P resection of aortic aneurysm    Trochanteric bursitis    Ulcerative colitis [facility])    Past Surgical History:  Procedure Laterality Date   AORTOBIEXTERNAL ILIAC BYPASS  1991   BIOPSY  11/25/2022   Procedure: BIOPSY;  Surgeon: Jeani Hawking, MD;  Location: [facility] ENDOSCOPY;  Service: Gastroenterology;;   CATARACT EXTRACTION Left 09/2019   CHOLECYSTECTOMY  1997   COLONOSCOPY WITH PROPOFOL N/A 11/25/2022   Procedure: COLONOSCOPY WITH PROPOFOL;  Surgeon: Jeani Hawking, MD;  Location: [facility] ENDOSCOPY;  Service: Gastroenterology;  Laterality: N/A;   CORONARY ANGIOPLASTY WITH STENT PLACEMENT  2006   to  LAD, ATRETIC LIMA AT THAT TIME ALSO   CORONARY ANGIOPLASTY WITH STENT PLACEMENT  2007   STENT TO PROX VG-DIAG, OTHER STENTS PATENT   CORONARY ARTERY BYPASS GRAFT  1998   LIMA-LAD;VG-DIAG & RCA   CORONARY STENT PLACEMENT  2005   TO LAD & LCX-STENTS,   ESOPHAGOGASTRODUODENOSCOPY (EGD) WITH PROPOFOL N/A 11/25/2022   Procedure: ESOPHAGOGASTRODUODENOSCOPY (EGD) WITH PROPOFOL;  Surgeon: Jeani Hawking, MD;  Location: [facility] ENDOSCOPY;  Service: Gastroenterology;  Laterality: N/A;   HERNIA REPAIR     TUBAL LIGATION     BTL    Allergies  Allergies  Allergen Reactions   Biotin Hives   Lisinopril Cough   Atenolol Other (See Comments)    colitis Other reaction(s): Bowel issues   Duloxetine Hcl Other (See Comments)    Mad pt feel spacey Other reaction(s): shaky; tired in the PMs   Lyrica [Pregabalin] Other (See Comments)    Made pt feel spacey   Penicillin G Rash    Other reaction(s): rash   Penicillins Rash   Sulfamethoxazole-Trimethoprim Nausea And Vomiting    Other reaction(s): elevated liver functions     Labs/Other Studies Reviewed    The following studies were reviewed today:  Cardiac Studies & Procedures     STRESS TESTS  NM MYOCAR MULTI W/SPECT W 03/29/2013   ECHOCARDIOGRAM  ECHOCARDIOGRAM COMPLETE 11/23/2022  Narrative ECHOCARDIOGRAM REPORT    Patient Name:   Kara Bush Date of Exam: 11/23/2022 Medical Rec #:  [id number]          Height:       64.0 in Accession #:    [id number]         Weight:       198.4 lb Date of Birth:  [birth year]         BSA:          1.950 m Patient Age:    81 years           BP:           146/56 mmHg Patient Gender: F                  HR:           88 bpm. Exam Location:  Inpatient  Procedure: 2D Echo, Cardiac Doppler and Color Doppler  STAT ECHO  Indications:    elevated troponin  History:        Patient has prior history of Echocardiogram examinations, most recent 08/27/2022. Prior CABG and resection of aortic aneurysm, PAD,  Arrythmias:Atrial Fibrillation, Signs/Symptoms:Chest Pain and Shortness of Breath; Risk Factors:Hypertension, Dyslipidemia and Sleep Apnea.  Sonographer:    Delcie Roch RDCS Referring Phys: [id number] JENNIFER YATES  IMPRESSIONS   1. Left ventricular ejection fraction, by estimation, is 60%. The left ventricle has normal function. The left ventricle has no regional wall motion abnormalities. There is mild left ventricular hypertrophy. Left ventricular diastolic parameters are indeterminate. 2. Right ventricular systolic function is normal. The right ventricular size is mildly enlarged. There is severely elevated pulmonary artery systolic pressure. The estimated right ventricular systolic pressure is 61.9 mmHg. 3. The mitral valve is grossly normal. Mild mitral valve regurgitation. 4. Tricuspid valve regurgitation is moderate to severe. 5. Fusion of right and left coronary cusps with raphe present.. 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 There is moderate calcification of the aortic valve. There is mild thickening of the aortic valve. Aortic valve regurgitation is mild. Mild aortic valve stenosis. Aortic valve mean gradient measures 15.0 mmHg. Aortic valve Vmax measures 2.57 m/s. Doppler alignment suboptimal, gradient and LVOT TVI may be underestimated. 6. There is borderline dilatation of the ascending aorta, measuring 40 mm. 7. The inferior vena cava is normal in size with <50% respiratory variability, suggesting right atrial pressure of 8 mmHg.  FINDINGS Left Ventricle: Left ventricular ejection fraction, by estimation, is 60%. The left ventricle has normal function. The left ventricle has no regional wall motion abnormalities. The left ventricular internal cavity size was normal in size. There is mild left ventricular hypertrophy. Left ventricular diastolic parameters are indeterminate.  Right Ventricle: The right ventricular size is mildly enlarged. No increase in right ventricular wall thickness.  Right ventricular systolic function is normal. There is severely elevated pulmonary artery systolic pressure. The tricuspid regurgitant velocity is 3.67 m/s, and with an assumed right atrial pressure of 8 mmHg, the estimated right ventricular systolic pressure is 61.9 mmHg.  Left Atrium: Left atrial size was normal in size.  Right Atrium: Right atrial size was normal in size.  Pericardium: There is no evidence of pericardial effusion.  Mitral Valve: The mitral valve is grossly normal. Mild mitral valve regurgitation.  Tricuspid Valve: The tricuspid valve is normal in structure. Tricuspid valve regurgitation is moderate to severe. No evidence of tricuspid stenosis.  Aortic Valve: Fusion of right and left coronary cusps with raphe present. 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 There is moderate calcification of the aortic valve. There is mild thickening of the aortic valve. Aortic  valve regurgitation is mild. Mild aortic stenosis is present. Aortic valve mean gradient measures 15.0 mmHg. Aortic valve peak gradient measures 26.4 mmHg. Aortic valve area, by VTI measures 0.88 cm.  Pulmonic Valve: The pulmonic valve was normal in structure. Pulmonic valve regurgitation is mild. No evidence of pulmonic stenosis.  Aorta: The aortic root is normal in size and structure. There is borderline dilatation of the ascending aorta, measuring 40 mm.  Venous: The inferior vena cava is normal in size with less than 50% respiratory variability, suggesting right atrial pressure of 8 mmHg.  IAS/Shunts: The interatrial septum was not well visualized.   LEFT VENTRICLE PLAX 2D LVIDd:         4.30 cm   Diastology LVIDs:         2.90 cm   LV e' lateral:   9.68 cm/s LV PW:         1.10 cm   LV E/e' lateral: 10.0 LV IVS:        1.00 cm LVOT diam:     2.10 cm LV SV:         54 LV SV Index:   27 LVOT Area:     3.46 cm   RIGHT VENTRICLE             IVC RV Basal diam:  3.30 cm     IVC diam: 2.00 cm RV S prime:      11.50 cm/s TAPSE (M-mode): 1.6 cm  LEFT ATRIUM             Index        RIGHT ATRIUM           Index LA diam:        4.00 cm 2.05 cm/m   RA Area:     17.10 cm LA Vol (A2C):   62.0 ml 31.80 ml/m  RA Volume:   41.80 ml  21.44 ml/m LA Vol (A4C):   63.9 ml 32.77 ml/m LA Biplane Vol: 64.4 ml 33.03 ml/m AORTIC VALVE AV Area (Vmax):    0.95 cm AV Area (Vmean):   0.90 cm AV Area (VTI):     0.88 cm AV Vmax:           257.00 cm/s AV Vmean:          183.000 cm/s AV VTI:            0.611 m AV Peak Grad:      26.4 mmHg AV Mean Grad:      15.0 mmHg LVOT Vmax:         70.45 cm/s LVOT Vmean:        47.700 cm/s LVOT VTI:          0.154 m LVOT/AV VTI ratio: 0.25  AORTA Ao Root diam: 3.30 cm Ao Asc diam:  4.00 cm  MITRAL VALVE                TRICUSPID VALVE MV Area (PHT): 5.13 cm     TR Peak grad:   53.9 mmHg MV Decel Time: 148 msec     TR Vmax:        367.00 cm/s MR Peak grad: 85.7 mmHg MR Mean grad: 57.0 mmHg     SHUNTS MR Vmax:      463.00 cm/s   Systemic VTI:  0.15 m MR Vmean:     360.0 cm/s    Systemic Diam: 2.10 cm MV E velocity: 97.00 cm/s MV A velocity: 107.00 cm/s MV  E/A ratio:  0.91  Weston Brass MD Electronically signed by Weston Brass MD Signature Date/Time: 11/23/2022/5:40:10 PM    Final    MONITORS  LONG TERM MONITOR (3-14 DAYS) 10/06/2018  Narrative Monitor shows paroxysmal afib.  Chrystie Nose, MD, [facility], FACP [place]  [facility] Medical Director of the [facility] Lipid Disorders [facility] Cardiovascular Risk Reduction [facility] Diplomate of the American Board of Clinical Lipidology Attending Cardiologist Direct Dial: [phone]  Fax: [phone] Website:  [email or web address]          Recent Labs: 11/23/2022: ALT 13 11/28/2022: BUN 16; Creatinine, Ser 1.74; Hemoglobin 8.3; Magnesium 1.6; Platelets 157; Potassium 3.4; Sodium 139  Recent Lipid Panel    Component Value Date/Time   CHOL 141 10/13/2019 0817   TRIG 128 10/13/2019 0817   HDL 56  10/13/2019 0817   CHOLHDL 2.5 10/13/2019 0817   LDLCALC 63 10/13/2019 0817    History of Present Illness    82 year old female with the above past medical history including CAD s/p CABG x 3  (LIMA-LAD, SVG-Diag, and SVG-RCA) in 1998 s/p multiple percutaneous interventions, paroxysmal atrial fibrillation on Eliquis, bicuspid aortic valve with mild to moderate regurgitation, chronic diastolic heart failure, PE, TIA, PAD s/p aortoiliac bypass, hypertension, hyperlipidemia, OSA, and anemia.  She has a history of remote CABG in 1998 with multiple subsequent PCIs/stenting (2005, 2006, 2000).  Nuclear stress test in 2014 was negative for ischemia.  Cardiac monitor in 2020 revealed atrial fibrillation.  She has a history of thoracic aortic aneurysm.  On routine CT angiogram in February 2022, she was noted to have a PE.  She has remained on Eliquis in the setting of A-fib.  She was last seen in the office on 10/24/2022 and was stable overall from a cardiac standpoint.  She did note some shortness of breath.  Lasix was increased with concern for mild fluid volume overload.  She was hospitalized in April 2024 in the setting of acute blood loss anemia, chest pain.  She received several units of PRBC (hemoglobin was 6 on arrival to the ED).  Eliquis was held.  She underwent EGD which showed mild ulcerative colitis, single nonbleeding colon lesion).  Outpatient follow-up with GI was recommended.  Eliquis was resumed at reduced dose.  Cardiology was consulted in the setting of elevated troponin, thought to be likely demand ischemia due to anemia.  Echocardiogram showed 60%, normal LV function, no RWMA, mild LVH, normal RV systolic function, mild mitral valve regurgitation, moderate to severe tricuspid valve regurgitation, bicuspid aortic valve mild aortic valve regurgitation, mild aortic stenosis, mean gradient 15 mmHg, borderline dilation of ascending aorta measuring 40 mm.  Outpatient cardiac PET stress test was  recommended.  She was referred to EP, Dr. Lalla Brothers for consideration of Watchman given acute blood loss anemia.  She was discharged home in stable condition on 11/28/2022.  She presents today for follow-up.  Since her hospitalization she has been stable from a cardiac standpoint.  She notes intermittent chest discomfort as well as intermittent nausea.  She has also noted intermittent atrial fibrillation on her Apple Watch.  She denies any tach, palpitations, edema, PND, or weight.  BP has been stable.    Home Medications    Current Outpatient Medications  Medication Sig Dispense Refill   amLODipine (NORVASC) 5 MG tablet TAKE ONE TABLET BY MOUTH ONCE daily 90 tablet 3   apixaban (ELIQUIS) 2.5 MG TABS tablet Take 1 tablet (2.5 mg total) by mouth 2 (two) times daily. 60 tablet 11  atorvastatin (LIPITOR) 20 MG tablet Take 1 tablet (20 mg total) by mouth daily. 30 tablet 1   b complex vitamins capsule Take 1 capsule by mouth daily.     betamethasone dipropionate 0.05 % cream Apply 1 Application topically daily.     citalopram (CELEXA) 20 MG tablet Take 1 tablet by mouth daily.     clobetasol ointment (TEMOVATE) 0.05 % Apply 1 Application topically 2 (two) times daily.     ezetimibe (ZETIA) 10 MG tablet TAKE ONE TABLET BY MOUTH ONCE daily 90 tablet 3   furosemide (LASIX) 40 MG tablet Take 1 tablet (40 mg total) by mouth 3 (three) times a week. Increase to every day as needed for weight gain of 2lbs overnight or 5lbs in 1 week 90 tablet 1   HYDROcodone-acetaminophen (NORCO/VICODIN) 5-325 MG tablet Take 1 tablet by mouth 4 (four) times daily as needed for moderate pain. May Take an extra 0.5 to one tablet when pain is severe. 140 tablet 0   methocarbamol (ROBAXIN) 500 MG tablet TAKE 1 TABLET BY MOUTH EVERY 8 HOURS AS NEEDED FOR MUSCLE SPASM (Patient taking differently: Take 500 mg by mouth every 8 (eight) hours as needed for muscle spasms.) 30 tablet 1   metoprolol succinate (TOPROL-XL) 50 MG 24 hr tablet  TAKE ONE TABLET BY MOUTH ONCE daily (Patient taking differently: Take 50 mg by mouth daily.) 90 tablet 1   Multiple Vitamin (MULITIVITAMIN WITH MINERALS) TABS Take 1 tablet by mouth daily.     potassium chloride (KLOR-CON M) 10 MEQ tablet Take 1 tablet (10 mEq total) by mouth 3 (three) times a week. And with extra lasix as needed. 90 tablet 3   zolpidem (AMBIEN CR) 12.5 MG CR tablet Take 12.5 mg by mouth at bedtime as needed.     No current facility-administered medications for this visit.     Review of Systems    She denies palpitations, dyspnea, pnd, orthopnea, v, dizziness, syncope, edema, weight gain, or early satiety. All other systems reviewed and are otherwise negative except as noted above.   Physical Exam    VS:  BP 130/62 (BP Location: Left Arm, Patient Position: Sitting, Cuff Size: Large)   Pulse 96   Ht 5\' 4"  (1.626 m)   Wt 184 lb (83.5 kg)   BMI 31.58 kg/m  GEN: Well nourished, well developed, in no acute distress. HEENT: normal. Neck: Supple, no JVD, carotid bruits, or masses. Cardiac: IRIR, no murmurs, rubs, or gallops. No clubbing, cyanosis, edema.  Radials/DP/PT 2+ and equal bilaterally.  Respiratory:  Respirations regular and unlabored, clear to auscultation bilaterally. GI: Soft, nontender, nondistended, BS + x 4. MS: no deformity or atrophy. Skin: warm and dry, no rash. Neuro:  Strength and sensation are intact. Psych: Normal affect.  Accessory Clinical Findings    ECG personally reviewed by me today -atrial fibrillation, 96 bpm- no acute changes.   Lab Results  Component Value Date   WBC 10.3 11/28/2022   HGB 8.3 (L) 11/28/2022   HCT 28.9 (L) 11/28/2022   MCV 72.8 (L) 11/28/2022   PLT 157 11/28/2022   Lab Results  Component Value Date   CREATININE 1.74 (H) 11/28/2022   BUN 16 11/28/2022   NA 139 11/28/2022   K 3.4 (L) 11/28/2022   CL 100 11/28/2022   CO2 26 11/28/2022   Lab Results  Component Value Date   ALT 13 11/23/2022   AST 21  11/23/2022   ALKPHOS 29 (L) 11/23/2022   BILITOT 1.1 11/23/2022  Lab Results  Component Value Date   CHOL 141 10/13/2019   HDL 56 10/13/2019   LDLCALC 63 10/13/2019   TRIG 128 10/13/2019   CHOLHDL 2.5 10/13/2019    No results found for: "HGBA1C"  Assessment & Plan    1. CAD/elevated troponin: S/p remote CABG in 1998 with multiple subsequent PCIs/stenting (2005, 2006, 2000).  Nuclear stress test in 2014 was negative for ischemia.  Recent chest discomfort, relieved with nitroglycerin.  She Is compliant with her medications.  She is pending cardiac PET stress test. Will reach out to schedulers to facilitate completion.  Will start Imdur 15 mg daily.  Discussed ED precautions.  Continue amlodipine, metoprolol, Lipitor, and Zetia.  2. Paroxysmal atrial fibrillation: She continues to have paroxysms of atrial fibrillation.  EKG today shows rate controlled A-fib.  She is asymptomatic.  Given recent concern for bleeding, severe symptomatic anemia, will refer to Dr. Lalla Brothers for consideration of Watchman.  Continue metoprolol, Eliquis.  3 Chronic diastolic heart failure: Most recent echo showed 60%, normal LV function, no RWMA, mild LVH, normal RV systolic function, mild mitral valve regurgitation, moderate to severe tricuspid valve regurgitation, bicuspid aortic valve mild aortic valve regurgitation, mild aortic stenosis, mean gradient 15 mmHg, borderline dilation of ascending aorta measuring 40 mm. Euvolemic and well compensated on exam.  Continue current medications as above, continue Lasix.  Will repeat BMET today. If renal function stable, BP stable, consider reintroduction of low-dose Entresto.   4. Anemia: Recent hospitalization with severe symptomatic anemia, hemoglobin dropped to 6.0.  Hemoglobin was stable at 9.3 in 12/2022.  Following with GI.  5. PAD: S/p aortoiliac bypass.  Follows with vascular surgery.  6. Hypertension: BP well controlled. Continue current antihypertensive regimen.    7. Hyperlipidemia: LDL was 48 in 12/2022.  Continue Lipitor.  8. Valvular heart disease: Recent echo revealed mild mitral valve regurgitation, moderate to severe tricuspid valve regurgitation, bicuspid aortic valve with mild aortic valve regurgitation, mild aortic valve stenosis, mean gradient 50 mmHg.  Consider repeat echo in 1 11/2023.  9. Thoracic aortic aneurysm: Stable at 40 mm on most recent echo.  BP well-controlled.  10. Disposition: Follow-up in 6- 8 weeks with APP, follow-up as scheduled with Dr. Rennis Golden in 04/2023.       Joylene Grapes, NP 12/26/2022, 2:25 PM

## 2022-12-26 NOTE — Patient Instructions (Addendum)
Medication Instructions:  Start Imdur 15 mg daily.  *If you need a refill on your cardiac medications before your next appointment, please call your pharmacy*   Lab Work: BMET today  If you have labs (blood work) drawn today and your tests are completely normal, you will receive your results only by: MyChart Message (if you have MyChart) OR A paper copy in the mail If you have any lab test that is abnormal or we need to change your treatment, we will call you to review the results.   Testing/Procedures: NONE ordered at this time of appointment    Follow-Up: At [facility], you and your health needs are our priority.  As part of our continuing mission to provide you with exceptional heart care, we have created designated Provider Care Teams.  These Care Teams include your primary Cardiologist (physician) and Advanced Practice Providers (APPs -  Physician Assistants and Nurse Practitioners) who all work together to provide you with the care you need, when you need it.  We recommend signing up for the patient portal called "MyChart".  Sign up information is provided on this After Visit Summary.  MyChart is used to connect with patients for Virtual Visits (Telemedicine).  Patients are able to view lab/test results, encounter notes, upcoming appointments, etc.  Non-urgent messages can be sent to your provider as well.   To learn more about what you can do with MyChart, go to [email or web address].    Your next appointment:   6-8 week(s)  Provider:   Bernadene Person, NP        Other Instructions Message sent to EP for referral appointment.

## 2022-12-27 LAB — BASIC METABOLIC PANEL
BUN/Creatinine Ratio: 10 — ABNORMAL LOW (ref 12–28)
BUN: 14 mg/dL (ref 8–27)
CO2: 31 mmol/L — ABNORMAL HIGH (ref 20–29)
Calcium: 9.5 mg/dL (ref 8.7–10.3)
Chloride: 92 mmol/L — ABNORMAL LOW (ref 96–106)
Creatinine, Ser: 1.38 mg/dL — ABNORMAL HIGH (ref 0.57–1.00)
Glucose: 111 mg/dL — ABNORMAL HIGH (ref 70–99)
Potassium: 3.5 mmol/L (ref 3.5–5.2)
Sodium: 141 mmol/L (ref 134–144)
eGFR: 38 mL/min/{1.73_m2} — ABNORMAL LOW (ref >59)

## 2022-12-31 DIAGNOSIS — G4733 Obstructive sleep apnea (adult) (pediatric): Secondary | ICD-10-CM | POA: Diagnosis not present

## 2022-12-31 DIAGNOSIS — I214 Non-ST elevation (NSTEMI) myocardial infarction: Secondary | ICD-10-CM | POA: Diagnosis not present

## 2022-12-31 DIAGNOSIS — I2489 Other forms of acute ischemic heart disease: Secondary | ICD-10-CM | POA: Diagnosis not present

## 2022-12-31 DIAGNOSIS — I5042 Chronic combined systolic (congestive) and diastolic (congestive) heart failure: Secondary | ICD-10-CM | POA: Diagnosis not present

## 2023-01-06 DIAGNOSIS — H02052 Trichiasis without entropian right lower eyelid: Secondary | ICD-10-CM | POA: Diagnosis not present

## 2023-01-23 ENCOUNTER — Ambulatory Visit: Payer: PPO | Attending: Cardiology | Admitting: Cardiology

## 2023-01-23 ENCOUNTER — Encounter: Payer: Self-pay | Admitting: Cardiology

## 2023-01-23 VITALS — BP 138/70 | HR 85 | Ht 64.0 in | Wt 185.8 lb

## 2023-01-23 DIAGNOSIS — I480 Paroxysmal atrial fibrillation: Secondary | ICD-10-CM

## 2023-01-23 DIAGNOSIS — D649 Anemia, unspecified: Secondary | ICD-10-CM

## 2023-01-23 NOTE — Progress Notes (Unsigned)
Electrophysiology Office Note:    Date:  01/23/2023   ID:  [name], DOB 1941-06-18, MRN [id number]  [facility] Cardiologist:  Chrystie Nose, MD  [facility] Electrophysiologist:  Lanier Prude, MD   Referring MD: Joylene Grapes, NP   Chief Complaint: Atrial fibrillation  History of Present Illness:    Kara Bush is a 82 y.o. female who I am seeing today for an evaluation of atrial fibrillation at the request of Bernadene Person, NP.  The patient was seen by Irving Burton on Dec 26, 2022.  The patient has a history of coronary artery disease post CABG in 1998, paroxysmal atrial fibrillation on Eliquis, bicuspid aortic valve with mild to moderate AI, chronic diastolic heart failure, PE, TIA, peripheral arterial disease post aortoiliac bypass, hypertension, hyperlipidemia, obstructive sleep apnea and anemia.  She was hospitalized in April 2024 in the setting of blood loss anemia.  She did require transfusion.  EGD was performed and showed mild ulcerative colitis and a nonbleeding colonic lesion.  Her pulmonary embolism was diagnosed in 2014.  She was never told why she had a pulmonary embolism.  She is back on her Eliquis and tolerating it 2.5 mg by mouth twice daily.    Their past medical, social and family history was reveiwed.   ROS:   Please see the history of present illness.    All other systems reviewed and are negative.  EKGs/Labs/Other Studies Reviewed:    The following studies were reviewed today:  I reviewed CT chest from 2014, 2023, 2022.  April 2024 echo EF 60%    Physical Exam:    VS:  BP 138/70   Pulse 85   Ht 5\' 4"  (1.626 m)   Wt 185 lb 12.8 oz (84.3 kg)   SpO2 93%   BMI 31.89 kg/m     Wt Readings from Last 3 Encounters:  01/23/23 185 lb 12.8 oz (84.3 kg)  12/26/22 184 lb (83.5 kg)  12/17/22 188 lb (85.3 kg)     GEN:  Well nourished, well developed in no acute distress.  Obese CARDIAC: RRR, no murmurs, rubs,  gallops RESPIRATORY:  Clear to auscultation without rales, wheezing or rhonchi       ASSESSMENT AND PLAN:    No diagnosis found.  #Persistent atrial fibrillation #Symptomatic anemia She is referred to discuss the possibility of left atrial appendage occlusion.  We discussed the procedure in detail including the indications and contraindications.  The patient has a history of an unprovoked pulmonary embolism in 2014 for which she takes anticoagulation.  We discussed how a Watchman device would not eliminate the need for anticoagulation given her history of pulmonary embolism.  This prior diagnosis of PE and her extensive vascular disease history makes her a poor candidate for left atrial appendage occlusion.  For now, continue Eliquis 2.5 mg by mouth twice daily.  When she sees Bernadene Person on July 5, recommend increasing to 5 mg by mouth twice daily if there is no evidence of recurrent anemia.  ----------------------   HAS-BLED score 3 Hypertension Yes  Abnormal renal and liver function (Dialysis, transplant, Cr >2.26 mg/dL /Cirrhosis or Bilirubin >2x Normal or AST/ALT/AP >3x Normal) No  Stroke No  Bleeding Yes  Labile INR (Unstable/high INR) No  Elderly (>65) Yes  Drugs or alcohol (? 8 drinks/week, anti-plt or NSAID) No   CHA2DS2-VASc Score = 5  The patient's score is based upon: CHF History: 0 HTN History: 1 Diabetes History: 0 Stroke History:  0 Vascular Disease History: 1 Age Score: 2 Gender Score: 1    Follow-up with EP on an as-needed basis.     Signed, Rossie Muskrat. Lalla Brothers, MD, [facility], [facility] 01/23/2023 2:44 PM    Electrophysiology [facility]

## 2023-01-23 NOTE — Patient Instructions (Signed)
Medication Instructions:  Your physician recommends that you continue on your current medications as directed. Please refer to the Current Medication list given to you today.  *If you need a refill on your cardiac medications before your next appointment, please call your pharmacy*  Follow-Up: At [facility], you and your health needs are our priority.  As part of our continuing mission to provide you with exceptional heart care, we have created designated Provider Care Teams.  These Care Teams include your primary Cardiologist (physician) and Advanced Practice Providers (APPs -  Physician Assistants and Nurse Practitioners) who all work together to provide you with the care you need, when you need it.  Your next appointment:   As needed with Dr. Lambert 

## 2023-01-31 DIAGNOSIS — I5042 Chronic combined systolic (congestive) and diastolic (congestive) heart failure: Secondary | ICD-10-CM | POA: Diagnosis not present

## 2023-01-31 DIAGNOSIS — G4733 Obstructive sleep apnea (adult) (pediatric): Secondary | ICD-10-CM | POA: Diagnosis not present

## 2023-01-31 DIAGNOSIS — I214 Non-ST elevation (NSTEMI) myocardial infarction: Secondary | ICD-10-CM | POA: Diagnosis not present

## 2023-01-31 DIAGNOSIS — I2489 Other forms of acute ischemic heart disease: Secondary | ICD-10-CM | POA: Diagnosis not present

## 2023-02-04 ENCOUNTER — Inpatient Hospital Stay (HOSPITAL_COMMUNITY)
Admission: EM | Admit: 2023-02-04 | Discharge: 2023-02-14 | DRG: 640 | Disposition: A | Discharge status: Home / Self Care | Payer: PPO | Attending: Internal Medicine | Admitting: Internal Medicine

## 2023-02-04 ENCOUNTER — Emergency Department (HOSPITAL_COMMUNITY): Payer: PPO

## 2023-02-04 ENCOUNTER — Other Ambulatory Visit: Payer: Self-pay

## 2023-02-04 ENCOUNTER — Encounter (HOSPITAL_COMMUNITY): Payer: Self-pay

## 2023-02-04 DIAGNOSIS — R9431 Abnormal electrocardiogram [ECG] [EKG]: Secondary | ICD-10-CM | POA: Diagnosis present

## 2023-02-04 DIAGNOSIS — R1013 Epigastric pain: Secondary | ICD-10-CM | POA: Diagnosis not present

## 2023-02-04 DIAGNOSIS — E1151 Type 2 diabetes mellitus with diabetic peripheral angiopathy without gangrene: Secondary | ICD-10-CM | POA: Diagnosis present

## 2023-02-04 DIAGNOSIS — Z888 Allergy status to other drugs, medicaments and biological substances status: Secondary | ICD-10-CM

## 2023-02-04 DIAGNOSIS — N179 Acute kidney failure, unspecified: Secondary | ICD-10-CM | POA: Diagnosis not present

## 2023-02-04 DIAGNOSIS — Z803 Family history of malignant neoplasm of breast: Secondary | ICD-10-CM

## 2023-02-04 DIAGNOSIS — R4182 Altered mental status, unspecified: Secondary | ICD-10-CM | POA: Diagnosis not present

## 2023-02-04 DIAGNOSIS — I130 Hypertensive heart and chronic kidney disease with heart failure and stage 1 through stage 4 chronic kidney disease, or unspecified chronic kidney disease: Secondary | ICD-10-CM | POA: Diagnosis present

## 2023-02-04 DIAGNOSIS — R29704 NIHSS score 4: Secondary | ICD-10-CM | POA: Diagnosis not present

## 2023-02-04 DIAGNOSIS — R000 Tachycardia, unspecified: Secondary | ICD-10-CM | POA: Diagnosis not present

## 2023-02-04 DIAGNOSIS — I700 Atherosclerosis of aorta: Secondary | ICD-10-CM | POA: Diagnosis not present

## 2023-02-04 DIAGNOSIS — I63522 Cerebral infarction due to unspecified occlusion or stenosis of left anterior cerebral artery: Secondary | ICD-10-CM | POA: Diagnosis not present

## 2023-02-04 DIAGNOSIS — I252 Old myocardial infarction: Secondary | ICD-10-CM

## 2023-02-04 DIAGNOSIS — Z87891 Personal history of nicotine dependence: Secondary | ICD-10-CM

## 2023-02-04 DIAGNOSIS — G9341 Metabolic encephalopathy: Secondary | ICD-10-CM | POA: Diagnosis present

## 2023-02-04 DIAGNOSIS — Z800 Family history of malignant neoplasm of digestive organs: Secondary | ICD-10-CM

## 2023-02-04 DIAGNOSIS — K449 Diaphragmatic hernia without obstruction or gangrene: Secondary | ICD-10-CM | POA: Diagnosis not present

## 2023-02-04 DIAGNOSIS — I213 ST elevation (STEMI) myocardial infarction of unspecified site: Secondary | ICD-10-CM | POA: Diagnosis not present

## 2023-02-04 DIAGNOSIS — I080 Rheumatic disorders of both mitral and aortic valves: Secondary | ICD-10-CM | POA: Diagnosis present

## 2023-02-04 DIAGNOSIS — I5033 Acute on chronic diastolic (congestive) heart failure: Secondary | ICD-10-CM | POA: Diagnosis not present

## 2023-02-04 DIAGNOSIS — R0789 Other chest pain: Secondary | ICD-10-CM | POA: Diagnosis present

## 2023-02-04 DIAGNOSIS — E8770 Fluid overload, unspecified: Secondary | ICD-10-CM

## 2023-02-04 DIAGNOSIS — E873 Alkalosis: Secondary | ICD-10-CM | POA: Diagnosis present

## 2023-02-04 DIAGNOSIS — Z9049 Acquired absence of other specified parts of digestive tract: Secondary | ICD-10-CM

## 2023-02-04 DIAGNOSIS — Z85828 Personal history of other malignant neoplasm of skin: Secondary | ICD-10-CM

## 2023-02-04 DIAGNOSIS — R4781 Slurred speech: Secondary | ICD-10-CM | POA: Diagnosis not present

## 2023-02-04 DIAGNOSIS — Z8249 Family history of ischemic heart disease and other diseases of the circulatory system: Secondary | ICD-10-CM

## 2023-02-04 DIAGNOSIS — K5190 Ulcerative colitis, unspecified, without complications: Secondary | ICD-10-CM | POA: Diagnosis present

## 2023-02-04 DIAGNOSIS — G894 Chronic pain syndrome: Secondary | ICD-10-CM | POA: Diagnosis present

## 2023-02-04 DIAGNOSIS — K2289 Other specified disease of esophagus: Secondary | ICD-10-CM | POA: Diagnosis present

## 2023-02-04 DIAGNOSIS — E1122 Type 2 diabetes mellitus with diabetic chronic kidney disease: Secondary | ICD-10-CM | POA: Diagnosis present

## 2023-02-04 DIAGNOSIS — G9340 Encephalopathy, unspecified: Secondary | ICD-10-CM | POA: Diagnosis present

## 2023-02-04 DIAGNOSIS — E876 Hypokalemia: Principal | ICD-10-CM | POA: Diagnosis present

## 2023-02-04 DIAGNOSIS — G4733 Obstructive sleep apnea (adult) (pediatric): Secondary | ICD-10-CM | POA: Diagnosis present

## 2023-02-04 DIAGNOSIS — G9332 Myalgic encephalomyelitis/chronic fatigue syndrome: Secondary | ICD-10-CM | POA: Diagnosis present

## 2023-02-04 DIAGNOSIS — Z79899 Other long term (current) drug therapy: Secondary | ICD-10-CM

## 2023-02-04 DIAGNOSIS — Z7901 Long term (current) use of anticoagulants: Secondary | ICD-10-CM

## 2023-02-04 DIAGNOSIS — Z880 Allergy status to penicillin: Secondary | ICD-10-CM

## 2023-02-04 DIAGNOSIS — E785 Hyperlipidemia, unspecified: Secondary | ICD-10-CM | POA: Diagnosis present

## 2023-02-04 DIAGNOSIS — Z86711 Personal history of pulmonary embolism: Secondary | ICD-10-CM

## 2023-02-04 DIAGNOSIS — I4819 Other persistent atrial fibrillation: Secondary | ICD-10-CM | POA: Diagnosis present

## 2023-02-04 DIAGNOSIS — R079 Chest pain, unspecified: Secondary | ICD-10-CM | POA: Diagnosis not present

## 2023-02-04 DIAGNOSIS — R112 Nausea with vomiting, unspecified: Secondary | ICD-10-CM | POA: Diagnosis present

## 2023-02-04 DIAGNOSIS — R29810 Facial weakness: Secondary | ICD-10-CM | POA: Diagnosis not present

## 2023-02-04 DIAGNOSIS — I6523 Occlusion and stenosis of bilateral carotid arteries: Secondary | ICD-10-CM | POA: Diagnosis not present

## 2023-02-04 DIAGNOSIS — Z951 Presence of aortocoronary bypass graft: Secondary | ICD-10-CM

## 2023-02-04 DIAGNOSIS — I517 Cardiomegaly: Secondary | ICD-10-CM | POA: Diagnosis not present

## 2023-02-04 DIAGNOSIS — I2489 Other forms of acute ischemic heart disease: Secondary | ICD-10-CM | POA: Diagnosis present

## 2023-02-04 DIAGNOSIS — I714 Abdominal aortic aneurysm, without rupture, unspecified: Secondary | ICD-10-CM | POA: Diagnosis present

## 2023-02-04 DIAGNOSIS — E874 Mixed disorder of acid-base balance: Secondary | ICD-10-CM | POA: Diagnosis present

## 2023-02-04 DIAGNOSIS — I2510 Atherosclerotic heart disease of native coronary artery without angina pectoris: Secondary | ICD-10-CM | POA: Diagnosis present

## 2023-02-04 DIAGNOSIS — M797 Fibromyalgia: Secondary | ICD-10-CM | POA: Diagnosis present

## 2023-02-04 DIAGNOSIS — Z882 Allergy status to sulfonamides status: Secondary | ICD-10-CM

## 2023-02-04 DIAGNOSIS — E860 Dehydration: Secondary | ICD-10-CM | POA: Diagnosis present

## 2023-02-04 DIAGNOSIS — D631 Anemia in chronic kidney disease: Secondary | ICD-10-CM | POA: Diagnosis present

## 2023-02-04 DIAGNOSIS — I959 Hypotension, unspecified: Secondary | ICD-10-CM | POA: Diagnosis not present

## 2023-02-04 DIAGNOSIS — R7989 Other specified abnormal findings of blood chemistry: Secondary | ICD-10-CM | POA: Diagnosis present

## 2023-02-04 DIAGNOSIS — Z66 Do not resuscitate: Secondary | ICD-10-CM | POA: Diagnosis present

## 2023-02-04 DIAGNOSIS — I10 Essential (primary) hypertension: Secondary | ICD-10-CM | POA: Diagnosis present

## 2023-02-04 DIAGNOSIS — I739 Peripheral vascular disease, unspecified: Secondary | ICD-10-CM | POA: Diagnosis present

## 2023-02-04 DIAGNOSIS — G4700 Insomnia, unspecified: Secondary | ICD-10-CM | POA: Diagnosis not present

## 2023-02-04 DIAGNOSIS — Z6833 Body mass index (BMI) 33.0-33.9, adult: Secondary | ICD-10-CM

## 2023-02-04 DIAGNOSIS — I5032 Chronic diastolic (congestive) heart failure: Secondary | ICD-10-CM

## 2023-02-04 DIAGNOSIS — Z1152 Encounter for screening for COVID-19: Secondary | ICD-10-CM

## 2023-02-04 DIAGNOSIS — Z955 Presence of coronary angioplasty implant and graft: Secondary | ICD-10-CM

## 2023-02-04 DIAGNOSIS — I4891 Unspecified atrial fibrillation: Secondary | ICD-10-CM | POA: Diagnosis present

## 2023-02-04 DIAGNOSIS — Z8673 Personal history of transient ischemic attack (TIA), and cerebral infarction without residual deficits: Secondary | ICD-10-CM

## 2023-02-04 DIAGNOSIS — N183 Chronic kidney disease, stage 3 unspecified: Secondary | ICD-10-CM | POA: Diagnosis present

## 2023-02-04 LAB — URINALYSIS, ROUTINE W REFLEX MICROSCOPIC
Bilirubin Urine: NEGATIVE
Glucose, UA: NEGATIVE mg/dL
Ketones, ur: NEGATIVE mg/dL
Nitrite: NEGATIVE
Protein, ur: NEGATIVE mg/dL
Specific Gravity, Urine: 1.017 (ref 1.005–1.030)
pH: 7 (ref 5.0–8.0)

## 2023-02-04 LAB — CBC
HCT: 47.2 % — ABNORMAL HIGH (ref 36.0–46.0)
Hemoglobin: 14.1 g/dL (ref 12.0–15.0)
MCH: 21 pg — ABNORMAL LOW (ref 26.0–34.0)
MCHC: 29.9 g/dL — ABNORMAL LOW (ref 30.0–36.0)
MCV: 70.4 fL — ABNORMAL LOW (ref 80.0–100.0)
Platelets: 304 10*3/uL (ref 150–400)
RBC: 6.7 MIL/uL — ABNORMAL HIGH (ref 3.87–5.11)
RDW: 19.2 % — ABNORMAL HIGH (ref 11.5–15.5)
WBC: 13.5 10*3/uL — ABNORMAL HIGH (ref 4.0–10.5)
nRBC: 0 % (ref 0.0–0.2)

## 2023-02-04 LAB — HEPATIC FUNCTION PANEL
ALT: 18 U/L (ref 0–44)
AST: 28 U/L (ref 15–41)
Albumin: 4.3 g/dL (ref 3.5–5.0)
Alkaline Phosphatase: 56 U/L (ref 38–126)
Bilirubin, Direct: <0.1 mg/dL (ref 0.0–0.2)
Total Bilirubin: 0.8 mg/dL (ref 0.3–1.2)
Total Protein: 8.3 g/dL — ABNORMAL HIGH (ref 6.5–8.1)

## 2023-02-04 LAB — BASIC METABOLIC PANEL
Anion gap: 17 — ABNORMAL HIGH (ref 5–15)
BUN: 19 mg/dL (ref 8–23)
CO2: 33 mmol/L — ABNORMAL HIGH (ref 22–32)
Calcium: 9.7 mg/dL (ref 8.9–10.3)
Chloride: 83 mmol/L — ABNORMAL LOW (ref 98–111)
Creatinine, Ser: 1.35 mg/dL — ABNORMAL HIGH (ref 0.44–1.00)
GFR, Estimated: 39 mL/min — ABNORMAL LOW (ref >60)
Glucose, Bld: 141 mg/dL — ABNORMAL HIGH (ref 70–99)
Potassium: 2.1 mmol/L — CL (ref 3.5–5.1)
Sodium: 133 mmol/L — ABNORMAL LOW (ref 135–145)

## 2023-02-04 LAB — BLOOD GAS, VENOUS
Acid-Base Excess: 22.1 mmol/L — ABNORMAL HIGH (ref 0.0–2.0)
Bicarbonate: 45.1 mmol/L — ABNORMAL HIGH (ref 20.0–28.0)
O2 Saturation: 90.7 %
Patient temperature: 37
pCO2, Ven: 40 mmHg — ABNORMAL LOW (ref 44–60)
pH, Ven: 7.66 (ref 7.25–7.43)
pO2, Ven: 52 mmHg — ABNORMAL HIGH (ref 32–45)

## 2023-02-04 LAB — TROPONIN I (HIGH SENSITIVITY): Troponin I (High Sensitivity): 45 ng/L — ABNORMAL HIGH (ref <18)

## 2023-02-04 LAB — CBG MONITORING, ED: Glucose-Capillary: 166 mg/dL — ABNORMAL HIGH (ref 70–99)

## 2023-02-04 LAB — LIPASE, BLOOD: Lipase: 59 U/L — ABNORMAL HIGH (ref 11–51)

## 2023-02-04 LAB — MAGNESIUM: Magnesium: 1.5 mg/dL — ABNORMAL LOW (ref 1.7–2.4)

## 2023-02-04 LAB — LACTIC ACID, PLASMA: Lactic Acid, Venous: 3.2 mmol/L (ref 0.5–1.9)

## 2023-02-04 MED ORDER — IOHEXOL 350 MG/ML SOLN
80.0000 mL | Freq: Once | INTRAVENOUS | Status: AC | PRN
  Administered 2023-02-04: 80 mL via INTRAVENOUS

## 2023-02-04 MED ORDER — LACTATED RINGERS IV BOLUS
500.0000 mL | Freq: Once | INTRAVENOUS | Status: AC
  Administered 2023-02-04: 500 mL via INTRAVENOUS

## 2023-02-04 MED ORDER — MAGNESIUM SULFATE 2 GM/50ML IV SOLN
2.0000 g | Freq: Once | INTRAVENOUS | Status: AC
  Administered 2023-02-04: 2 g via INTRAVENOUS
  Filled 2023-02-04: qty 50

## 2023-02-04 MED ORDER — POTASSIUM CHLORIDE 10 MEQ/100ML IV SOLN
10.0000 meq | INTRAVENOUS | Status: AC
  Administered 2023-02-04 – 2023-02-05 (×4): 10 meq via INTRAVENOUS
  Filled 2023-02-04 (×4): qty 100

## 2023-02-04 MED ORDER — FENTANYL CITRATE PF 50 MCG/ML IJ SOSY
12.5000 ug | PREFILLED_SYRINGE | Freq: Once | INTRAMUSCULAR | Status: AC
  Administered 2023-02-04: 12.5 ug via INTRAVENOUS
  Filled 2023-02-04: qty 1

## 2023-02-04 MED ORDER — ASPIRIN 325 MG PO TBEC
325.0000 mg | DELAYED_RELEASE_TABLET | Freq: Every day | ORAL | Status: DC
  Administered 2023-02-04: 325 mg via ORAL
  Filled 2023-02-04 (×2): qty 1

## 2023-02-04 MED ORDER — ONDANSETRON HCL 4 MG/2ML IJ SOLN
4.0000 mg | Freq: Once | INTRAMUSCULAR | Status: AC
  Administered 2023-02-04: 4 mg via INTRAVENOUS
  Filled 2023-02-04: qty 2

## 2023-02-04 NOTE — ED Triage Notes (Signed)
D/c from [facility] the end of April and had blood transfusions. Pt has been having nausea since d/c, but reports it is worse tonight. Pt daughter states that pt is disoriented and states she has been saying the wrong words and slurred speech for the last couple of days. Increase in fatigue over the last few days. A&Ox4 in triage.

## 2023-02-04 NOTE — ED Notes (Signed)
Notified RN of Code Stemi per Dr. Jearld Fenton.

## 2023-02-04 NOTE — ED Provider Notes (Signed)
[facility] AT [facility] Provider Note   CSN: [id number] Arrival date & time: 02/04/23  2049     History {Add pertinent medical, surgical, social history, OB history to HPI:1} Chief Complaint  Patient presents with   Nausea   Fatigue   Weakness    SHARLON SCHUELER is a 82 y.o. female with CAD s/p CABG 1998, h/o NSTEMI PAD s/p aortic-iliac bypass 1991, s/p resection fo aortic aneurysm 1998, OSA, symptomatic anemia, Afib on eliquis presents with nausea/fatigue/weakness/abd pain/confusion. Daughter at bedside provides history.   N/V over the last couple of months w/ diarrhea. Over the last 1-2 days the nausea/vomiting has gotten significantly worse a/w fatigue, generalized weakness, upper epigastric pain, and disorientation/confusion. She has had some increased slurred speech as well. Last time she was without slurred speech/confusion was Sunday sometime (four days ago).  Intermittently oriented but was not oriented for me on my eval, A&Ox0. Daughter doesn't know what symptoms she had with her heart attack in the past.   Patient altered, cannot provide more history. States she is having abdominal pain and some chest pain, nausea. Cannot answer further questions.   Weakness      Home Medications Prior to Admission medications   Medication Sig Start Date End Date Taking? Authorizing Provider  amLODipine (NORVASC) 5 MG tablet TAKE ONE TABLET BY MOUTH ONCE daily 07/21/22   Hilty, Lisette Abu, MD  apixaban (ELIQUIS) 2.5 MG TABS tablet Take 1 tablet (2.5 mg total) by mouth 2 (two) times daily. 11/28/22   Willeen Niece, MD  atorvastatin (LIPITOR) 20 MG tablet Take 1 tablet (20 mg total) by mouth daily. 11/28/22   Willeen Niece, MD  b complex vitamins capsule Take 1 capsule by mouth daily.    [provider]  betamethasone dipropionate 0.05 % cream Apply 1 Application topically daily.    [provider]  citalopram (CELEXA) 20 MG tablet  Take 1 tablet by mouth daily.    [provider]  clobetasol ointment (TEMOVATE) 0.05 % Apply 1 Application topically 2 (two) times daily.    [provider]  ezetimibe (ZETIA) 10 MG tablet TAKE ONE TABLET BY MOUTH ONCE daily 07/21/22   Hilty, Lisette Abu, MD  furosemide (LASIX) 40 MG tablet Take 1 tablet (40 mg total) by mouth 3 (three) times a week. Increase to every day as needed for weight gain of 2lbs overnight or 5lbs in 1 week 09/26/22   Chrystie Nose, MD  HYDROcodone-acetaminophen (NORCO/VICODIN) 5-325 MG tablet Take 1 tablet by mouth 4 (four) times daily as needed for moderate pain. May Take an extra 0.5 to one tablet when pain is severe. 12/11/22   Jones Bales, NP  isosorbide mononitrate (IMDUR) 30 MG 24 hr tablet Take 0.5 tablets (15 mg total) by mouth daily. 12/26/22 03/26/23  Joylene Grapes, NP  methocarbamol (ROBAXIN) 500 MG tablet TAKE 1 TABLET BY MOUTH EVERY 8 HOURS AS NEEDED FOR MUSCLE SPASM Patient taking differently: Take 500 mg by mouth every 8 (eight) hours as needed for muscle spasms. 10/23/22   Jones Bales, NP  metoprolol succinate (TOPROL-XL) 50 MG 24 hr tablet TAKE ONE TABLET BY MOUTH ONCE daily Patient taking differently: Take 50 mg by mouth daily. 10/23/22   Hilty, Lisette Abu, MD  Multiple Vitamin (MULITIVITAMIN WITH MINERALS) TABS Take 1 tablet by mouth daily.    [provider]  potassium chloride (KLOR-CON M) 10 MEQ tablet Take 1 tablet (10 mEq total) by mouth 3 (  three) times a week. And with extra lasix as needed. 09/26/22   Hilty, Lisette Abu, MD  zolpidem (AMBIEN CR) 12.5 MG CR tablet Take 12.5 mg by mouth at bedtime as needed. 09/11/21   [provider]      Allergies    Biotin, Lisinopril, Atenolol, Duloxetine hcl, Lyrica [pregabalin], Penicillin g, Penicillins, and Sulfamethoxazole-trimethoprim    Review of Systems   Review of Systems  Neurological:  Positive for weakness.   A 10 point review of systems was performed and is  negative unless otherwise reported in HPI.  Physical Exam Updated Vital Signs BP (!) 172/96 (BP Location: Right Arm)   Pulse 60   Temp 97.9 F (36.6 C) (Oral)   Resp 18   Ht 5\' 4"  (1.626 m) Comment: Simultaneous filing. User may not have seen previous data.  Wt 79.8 kg Comment: Simultaneous filing. User may not have seen previous data.  SpO2 100%   BMI 30.21 kg/m  Physical Exam General: Acutely ill appearing elderly woman, lying in bed. Restless.  HEENT: PERRLA, Sclera anicteric, dry mucous membranes, trachea midline.  Cardiology: RRR, no murmurs/rubs/gallops. BL radial and DP pulses equal bilaterally.  Resp: Normal respiratory rate and effort. CTAB, no wheezes, rhonchi, crackles.  Abd: Mildly TTP in epigastric region. Soft,  non-distended. No rebound tenderness or guarding.  GU: Deferred. MSK: No peripheral edema or signs of trauma. Extremities without deformity or TTP. No cyanosis or clubbing. Skin: warm, dry. No rashes or lesions. Back: No CVA tenderness Neuro: A&Ox0, CNs II-XII grossly intact. MAEs. Sensation grossly intact.  Psych: Normal mood and affect.   ED Results / Procedures / Treatments   Labs (all labs ordered are listed, but only abnormal results are displayed) Labs Reviewed  CBC - Abnormal; Notable for the following components:      Result Value   WBC 13.5 (*)    RBC 6.70 (*)    HCT 47.2 (*)    MCV 70.4 (*)    MCH 21.0 (*)    MCHC 29.9 (*)    RDW 19.2 (*)    All other components within normal limits  BASIC METABOLIC PANEL  URINALYSIS, ROUTINE W REFLEX MICROSCOPIC  LIPASE, BLOOD  HEPATIC FUNCTION PANEL  MAGNESIUM  CBG MONITORING, ED  TROPONIN I (HIGH SENSITIVITY)    EKG None  Radiology No results found.  Procedures Procedures  {Document cardiac monitor, telemetry assessment procedure when appropriate:1}  Medications Ordered in ED Medications  ondansetron (ZOFRAN) injection 4 mg (has no administration in time range)  aspirin EC tablet 325 mg  (has no administration in time range)    ED Course/ Medical Decision Making/ A&P                          Medical Decision Making Amount and/or Complexity of Data Reviewed Labs: ordered.    This patient presents to the ED for concern of ***, this involves an extensive number of treatment options, and is a complaint that carries with it a high risk of complications and morbidity.  I considered the following differential and admission for this acute, potentially life threatening condition.   MDM:    ***     Labs: I Ordered, and personally interpreted labs.  The pertinent results include:  ***  Imaging Studies ordered: I ordered imaging studies including *** I independently visualized and interpreted imaging. I agree with the radiologist interpretation  Additional history obtained from ***.  External records from outside source  obtained and reviewed including ***  Cardiac Monitoring: The patient was maintained on a cardiac monitor.  I personally viewed and interpreted the cardiac monitored which showed an underlying rhythm of: ***  Reevaluation: After the interventions noted above, I reevaluated the patient and found that they have :{resolved/improved/worsened:23923::"improved"}  Social Determinants of Health: ***  Disposition:  ***  Co morbidities that complicate the patient evaluation  Past Medical History:  Diagnosis Date   Abdominal aortic aneurysm (HCC)    Anemia, improved 01/11/2013   Basal cell carcinoma    CAD (coronary artery disease)    hx CABG 1998, last cath 2007 with PCI and stenting to the proximal segment of the vein graft to the diagonal branch. DES Taxus was placed   Cervicalgia    Chronic fatigue fibromyalgia syndrome    Chronic pain syndrome    Depression    Fibromyalgia    History of nuclear stress test 08/07/2011   lexiscan; no evidence of ischemia or infarct; low risk    Hyperlipidemia    Hypertension    OSA (obstructive sleep apnea)     PAD (peripheral artery disease) (HCC)    Pes anserine bursitis    Pulmonary embolism (HCC) 01/11/2013   S/P resection of aortic aneurysm    Trochanteric bursitis    Ulcerative colitis (HCC)      Medicines Meds ordered this encounter  Medications   ondansetron (ZOFRAN) injection 4 mg   aspirin EC tablet 325 mg    I have reviewed the patients home medicines and have made adjustments as needed  Problem List / ED Course: Problem List Items Addressed This Visit   None        {Document critical care time when appropriate:1} {Document review of labs and clinical decision tools ie heart score, Chads2Vasc2 etc:1}  {Document your independent review of radiology images, and any outside records:1} {Document your discussion with family members, caretakers, and with consultants:1} {Document social determinants of health affecting pt's care:1} {Document your decision making why or why not admission, treatments were needed:1}  This note was created using dictation software, which may contain spelling or grammatical errors.

## 2023-02-05 ENCOUNTER — Observation Stay (HOSPITAL_COMMUNITY): Payer: PPO

## 2023-02-05 ENCOUNTER — Inpatient Hospital Stay (HOSPITAL_COMMUNITY): Payer: PPO

## 2023-02-05 DIAGNOSIS — M797 Fibromyalgia: Secondary | ICD-10-CM

## 2023-02-05 DIAGNOSIS — N183 Chronic kidney disease, stage 3 unspecified: Secondary | ICD-10-CM | POA: Diagnosis not present

## 2023-02-05 DIAGNOSIS — Z6833 Body mass index (BMI) 33.0-33.9, adult: Secondary | ICD-10-CM | POA: Diagnosis not present

## 2023-02-05 DIAGNOSIS — R112 Nausea with vomiting, unspecified: Secondary | ICD-10-CM | POA: Diagnosis present

## 2023-02-05 DIAGNOSIS — R7989 Other specified abnormal findings of blood chemistry: Secondary | ICD-10-CM

## 2023-02-05 DIAGNOSIS — K5730 Diverticulosis of large intestine without perforation or abscess without bleeding: Secondary | ICD-10-CM | POA: Diagnosis not present

## 2023-02-05 DIAGNOSIS — G4733 Obstructive sleep apnea (adult) (pediatric): Secondary | ICD-10-CM

## 2023-02-05 DIAGNOSIS — E1122 Type 2 diabetes mellitus with diabetic chronic kidney disease: Secondary | ICD-10-CM | POA: Diagnosis not present

## 2023-02-05 DIAGNOSIS — K5190 Ulcerative colitis, unspecified, without complications: Secondary | ICD-10-CM | POA: Diagnosis not present

## 2023-02-05 DIAGNOSIS — G9340 Encephalopathy, unspecified: Secondary | ICD-10-CM

## 2023-02-05 DIAGNOSIS — I130 Hypertensive heart and chronic kidney disease with heart failure and stage 1 through stage 4 chronic kidney disease, or unspecified chronic kidney disease: Secondary | ICD-10-CM | POA: Diagnosis not present

## 2023-02-05 DIAGNOSIS — K224 Dyskinesia of esophagus: Secondary | ICD-10-CM | POA: Diagnosis not present

## 2023-02-05 DIAGNOSIS — D509 Iron deficiency anemia, unspecified: Secondary | ICD-10-CM | POA: Diagnosis not present

## 2023-02-05 DIAGNOSIS — R918 Other nonspecific abnormal finding of lung field: Secondary | ICD-10-CM | POA: Diagnosis not present

## 2023-02-05 DIAGNOSIS — E860 Dehydration: Secondary | ICD-10-CM

## 2023-02-05 DIAGNOSIS — K449 Diaphragmatic hernia without obstruction or gangrene: Secondary | ICD-10-CM | POA: Diagnosis not present

## 2023-02-05 DIAGNOSIS — I739 Peripheral vascular disease, unspecified: Secondary | ICD-10-CM

## 2023-02-05 DIAGNOSIS — I4819 Other persistent atrial fibrillation: Secondary | ICD-10-CM | POA: Diagnosis not present

## 2023-02-05 DIAGNOSIS — G9332 Myalgic encephalomyelitis/chronic fatigue syndrome: Secondary | ICD-10-CM | POA: Diagnosis not present

## 2023-02-05 DIAGNOSIS — I5032 Chronic diastolic (congestive) heart failure: Secondary | ICD-10-CM

## 2023-02-05 DIAGNOSIS — I10 Essential (primary) hypertension: Secondary | ICD-10-CM | POA: Diagnosis not present

## 2023-02-05 DIAGNOSIS — R1013 Epigastric pain: Secondary | ICD-10-CM | POA: Diagnosis present

## 2023-02-05 DIAGNOSIS — I2489 Other forms of acute ischemic heart disease: Secondary | ICD-10-CM | POA: Diagnosis not present

## 2023-02-05 DIAGNOSIS — R110 Nausea: Secondary | ICD-10-CM | POA: Diagnosis not present

## 2023-02-05 DIAGNOSIS — R4182 Altered mental status, unspecified: Secondary | ICD-10-CM | POA: Diagnosis not present

## 2023-02-05 DIAGNOSIS — E785 Hyperlipidemia, unspecified: Secondary | ICD-10-CM | POA: Diagnosis not present

## 2023-02-05 DIAGNOSIS — J9811 Atelectasis: Secondary | ICD-10-CM | POA: Diagnosis not present

## 2023-02-05 DIAGNOSIS — Z1152 Encounter for screening for COVID-19: Secondary | ICD-10-CM | POA: Diagnosis not present

## 2023-02-05 DIAGNOSIS — I517 Cardiomegaly: Secondary | ICD-10-CM | POA: Diagnosis not present

## 2023-02-05 DIAGNOSIS — I5030 Unspecified diastolic (congestive) heart failure: Secondary | ICD-10-CM | POA: Diagnosis not present

## 2023-02-05 DIAGNOSIS — E876 Hypokalemia: Secondary | ICD-10-CM | POA: Diagnosis not present

## 2023-02-05 DIAGNOSIS — I639 Cerebral infarction, unspecified: Secondary | ICD-10-CM | POA: Diagnosis not present

## 2023-02-05 DIAGNOSIS — I480 Paroxysmal atrial fibrillation: Secondary | ICD-10-CM

## 2023-02-05 DIAGNOSIS — I714 Abdominal aortic aneurysm, without rupture, unspecified: Secondary | ICD-10-CM | POA: Diagnosis not present

## 2023-02-05 DIAGNOSIS — I63522 Cerebral infarction due to unspecified occlusion or stenosis of left anterior cerebral artery: Secondary | ICD-10-CM | POA: Diagnosis not present

## 2023-02-05 DIAGNOSIS — G9341 Metabolic encephalopathy: Secondary | ICD-10-CM | POA: Diagnosis not present

## 2023-02-05 DIAGNOSIS — R0789 Other chest pain: Secondary | ICD-10-CM

## 2023-02-05 DIAGNOSIS — G894 Chronic pain syndrome: Secondary | ICD-10-CM | POA: Diagnosis not present

## 2023-02-05 DIAGNOSIS — K51919 Ulcerative colitis, unspecified with unspecified complications: Secondary | ICD-10-CM

## 2023-02-05 DIAGNOSIS — Z66 Do not resuscitate: Secondary | ICD-10-CM | POA: Diagnosis not present

## 2023-02-05 DIAGNOSIS — I672 Cerebral atherosclerosis: Secondary | ICD-10-CM | POA: Diagnosis not present

## 2023-02-05 DIAGNOSIS — R569 Unspecified convulsions: Secondary | ICD-10-CM | POA: Diagnosis not present

## 2023-02-05 DIAGNOSIS — I5033 Acute on chronic diastolic (congestive) heart failure: Secondary | ICD-10-CM | POA: Diagnosis not present

## 2023-02-05 DIAGNOSIS — N1832 Chronic kidney disease, stage 3b: Secondary | ICD-10-CM | POA: Diagnosis not present

## 2023-02-05 DIAGNOSIS — E1151 Type 2 diabetes mellitus with diabetic peripheral angiopathy without gangrene: Secondary | ICD-10-CM | POA: Diagnosis not present

## 2023-02-05 DIAGNOSIS — E873 Alkalosis: Secondary | ICD-10-CM | POA: Diagnosis present

## 2023-02-05 DIAGNOSIS — D631 Anemia in chronic kidney disease: Secondary | ICD-10-CM | POA: Diagnosis not present

## 2023-02-05 DIAGNOSIS — R0600 Dyspnea, unspecified: Secondary | ICD-10-CM | POA: Diagnosis not present

## 2023-02-05 DIAGNOSIS — E874 Mixed disorder of acid-base balance: Secondary | ICD-10-CM | POA: Diagnosis not present

## 2023-02-05 DIAGNOSIS — I959 Hypotension, unspecified: Secondary | ICD-10-CM | POA: Diagnosis not present

## 2023-02-05 DIAGNOSIS — E8770 Fluid overload, unspecified: Secondary | ICD-10-CM | POA: Diagnosis not present

## 2023-02-05 DIAGNOSIS — N179 Acute kidney failure, unspecified: Secondary | ICD-10-CM | POA: Diagnosis not present

## 2023-02-05 DIAGNOSIS — I080 Rheumatic disorders of both mitral and aortic valves: Secondary | ICD-10-CM | POA: Diagnosis not present

## 2023-02-05 DIAGNOSIS — R9431 Abnormal electrocardiogram [ECG] [EKG]: Secondary | ICD-10-CM | POA: Diagnosis present

## 2023-02-05 DIAGNOSIS — R1310 Dysphagia, unspecified: Secondary | ICD-10-CM | POA: Diagnosis not present

## 2023-02-05 DIAGNOSIS — R197 Diarrhea, unspecified: Secondary | ICD-10-CM

## 2023-02-05 LAB — CBC WITH DIFFERENTIAL/PLATELET
Abs Immature Granulocytes: 0.06 10*3/uL (ref 0.00–0.07)
Basophils Absolute: 0.1 10*3/uL (ref 0.0–0.1)
Basophils Relative: 0 %
Eosinophils Absolute: 0 10*3/uL (ref 0.0–0.5)
Eosinophils Relative: 0 %
HCT: 40.2 % (ref 36.0–46.0)
Hemoglobin: 12.2 g/dL (ref 12.0–15.0)
Immature Granulocytes: 0 %
Lymphocytes Relative: 11 %
Lymphs Abs: 1.6 10*3/uL (ref 0.7–4.0)
MCH: 21.4 pg — ABNORMAL LOW (ref 26.0–34.0)
MCHC: 30.3 g/dL (ref 30.0–36.0)
MCV: 70.7 fL — ABNORMAL LOW (ref 80.0–100.0)
Monocytes Absolute: 0.8 10*3/uL (ref 0.1–1.0)
Monocytes Relative: 5 %
Neutro Abs: 11.9 10*3/uL — ABNORMAL HIGH (ref 1.7–7.7)
Neutrophils Relative %: 84 %
Platelets: 268 10*3/uL (ref 150–400)
RBC: 5.69 MIL/uL — ABNORMAL HIGH (ref 3.87–5.11)
RDW: 18.2 % — ABNORMAL HIGH (ref 11.5–15.5)
WBC: 14.4 10*3/uL — ABNORMAL HIGH (ref 4.0–10.5)
nRBC: 0 % (ref 0.0–0.2)

## 2023-02-05 LAB — CK: Total CK: 72 U/L (ref 38–234)

## 2023-02-05 LAB — COMPREHENSIVE METABOLIC PANEL
ALT: 17 U/L (ref 0–44)
AST: 25 U/L (ref 15–41)
Albumin: 3.6 g/dL (ref 3.5–5.0)
Alkaline Phosphatase: 43 U/L (ref 38–126)
Anion gap: 12 (ref 5–15)
BUN: 15 mg/dL (ref 8–23)
CO2: 34 mmol/L — ABNORMAL HIGH (ref 22–32)
Calcium: 8.3 mg/dL — ABNORMAL LOW (ref 8.9–10.3)
Chloride: 85 mmol/L — ABNORMAL LOW (ref 98–111)
Creatinine, Ser: 1.06 mg/dL — ABNORMAL HIGH (ref 0.44–1.00)
GFR, Estimated: 52 mL/min — ABNORMAL LOW (ref >60)
Glucose, Bld: 150 mg/dL — ABNORMAL HIGH (ref 70–99)
Potassium: 2.1 mmol/L — CL (ref 3.5–5.1)
Sodium: 131 mmol/L — ABNORMAL LOW (ref 135–145)
Total Bilirubin: 0.9 mg/dL (ref 0.3–1.2)
Total Protein: 6.7 g/dL (ref 6.5–8.1)

## 2023-02-05 LAB — MAGNESIUM: Magnesium: 1.8 mg/dL (ref 1.7–2.4)

## 2023-02-05 LAB — ECHOCARDIOGRAM COMPLETE
AR max vel: 1.16 cm2
AV Area VTI: 1.19 cm2
AV Area mean vel: 1.11 cm2
AV Mean grad: 16.5 mmHg
AV Peak grad: 27.8 mmHg
Ao pk vel: 2.64 m/s
Height: 64 in
S' Lateral: 3.5 cm
Weight: 2828.94 oz

## 2023-02-05 LAB — CBC
HCT: 38.5 % (ref 36.0–46.0)
Hemoglobin: 11.5 g/dL — ABNORMAL LOW (ref 12.0–15.0)
MCH: 21.2 pg — ABNORMAL LOW (ref 26.0–34.0)
MCHC: 29.9 g/dL — ABNORMAL LOW (ref 30.0–36.0)
MCV: 70.9 fL — ABNORMAL LOW (ref 80.0–100.0)
Platelets: 231 10*3/uL (ref 150–400)
RBC: 5.43 MIL/uL — ABNORMAL HIGH (ref 3.87–5.11)
RDW: 18.4 % — ABNORMAL HIGH (ref 11.5–15.5)
WBC: 13 10*3/uL — ABNORMAL HIGH (ref 4.0–10.5)
nRBC: 0 % (ref 0.0–0.2)

## 2023-02-05 LAB — OSMOLALITY, URINE: Osmolality, Ur: 352 mOsm/kg (ref 300–900)

## 2023-02-05 LAB — TROPONIN I (HIGH SENSITIVITY)
Troponin I (High Sensitivity): 44 ng/L — ABNORMAL HIGH (ref <18)
Troponin I (High Sensitivity): 43 ng/L — ABNORMAL HIGH (ref <18)
Troponin I (High Sensitivity): 91 ng/L — ABNORMAL HIGH (ref <18)
Troponin I (High Sensitivity): 633 ng/L (ref <18)

## 2023-02-05 LAB — LIPID PANEL
Cholesterol: 183 mg/dL (ref 0–200)
HDL: 53 mg/dL (ref >40)
LDL Cholesterol: 111 mg/dL — ABNORMAL HIGH (ref 0–99)
Total CHOL/HDL Ratio: 3.5 RATIO
Triglycerides: 94 mg/dL (ref <150)
VLDL: 19 mg/dL (ref 0–40)

## 2023-02-05 LAB — LACTIC ACID, PLASMA: Lactic Acid, Venous: 1.6 mmol/L (ref 0.5–1.9)

## 2023-02-05 LAB — PREALBUMIN: Prealbumin: 25 mg/dL (ref 18–38)

## 2023-02-05 LAB — SARS CORONAVIRUS 2 BY RT PCR: SARS Coronavirus 2 by RT PCR: NEGATIVE

## 2023-02-05 LAB — GLUCOSE, CAPILLARY
Glucose-Capillary: 142 mg/dL — ABNORMAL HIGH (ref 70–99)
Glucose-Capillary: 158 mg/dL — ABNORMAL HIGH (ref 70–99)

## 2023-02-05 LAB — TSH: TSH: 0.733 u[IU]/mL (ref 0.350–4.500)

## 2023-02-05 LAB — OSMOLALITY: Osmolality: 288 mOsm/kg (ref 275–295)

## 2023-02-05 LAB — PHOSPHORUS: Phosphorus: 3.5 mg/dL (ref 2.5–4.6)

## 2023-02-05 LAB — SODIUM, URINE, RANDOM: Sodium, Ur: 94 mmol/L

## 2023-02-05 LAB — AMMONIA: Ammonia: 16 umol/L (ref 9–35)

## 2023-02-05 LAB — POTASSIUM: Potassium: 2.9 mmol/L — ABNORMAL LOW (ref 3.5–5.1)

## 2023-02-05 MED ORDER — METHOCARBAMOL 1000 MG/10ML IJ SOLN
500.0000 mg | Freq: Four times a day (QID) | INTRAVENOUS | Status: DC | PRN
  Administered 2023-02-05 – 2023-02-07 (×2): 500 mg via INTRAVENOUS
  Filled 2023-02-05 (×2): qty 500

## 2023-02-05 MED ORDER — POTASSIUM CHLORIDE 2 MEQ/ML IV SOLN: INTRAVENOUS | Status: DC

## 2023-02-05 MED ORDER — APIXABAN 2.5 MG PO TABS
2.5000 mg | ORAL_TABLET | Freq: Two times a day (BID) | ORAL | Status: DC
  Administered 2023-02-05 – 2023-02-14 (×19): 2.5 mg via ORAL
  Filled 2023-02-05 (×19): qty 1

## 2023-02-05 MED ORDER — METOCLOPRAMIDE HCL 5 MG/ML IJ SOLN
5.0000 mg | Freq: Four times a day (QID) | INTRAMUSCULAR | Status: DC | PRN
  Administered 2023-02-05 – 2023-02-06 (×2): 10 mg via INTRAVENOUS
  Filled 2023-02-05 (×2): qty 2

## 2023-02-05 MED ORDER — HYALURONIDASE HUMAN 150 UNIT/ML IJ SOLN
150.0000 [IU] | Freq: Once | INTRAMUSCULAR | Status: AC
  Administered 2023-02-05: 150 [IU] via SUBCUTANEOUS
  Filled 2023-02-05: qty 1

## 2023-02-05 MED ORDER — LABETALOL HCL 5 MG/ML IV SOLN
10.0000 mg | INTRAVENOUS | Status: DC | PRN
  Administered 2023-02-05 – 2023-02-08 (×6): 10 mg via INTRAVENOUS
  Filled 2023-02-05 (×5): qty 4

## 2023-02-05 MED ORDER — ATORVASTATIN CALCIUM 40 MG PO TABS
20.0000 mg | ORAL_TABLET | Freq: Every day | ORAL | Status: DC
  Administered 2023-02-05 – 2023-02-06 (×2): 20 mg via ORAL
  Filled 2023-02-05 (×2): qty 1

## 2023-02-05 MED ORDER — PROMETHAZINE (PHENERGAN) 6.25MG IN NS 50ML IVPB
6.2500 mg | Freq: Four times a day (QID) | INTRAVENOUS | Status: DC | PRN
  Administered 2023-02-08: 6.25 mg via INTRAVENOUS
  Filled 2023-02-05: qty 50
  Filled 2023-02-05: qty 6.25

## 2023-02-05 MED ORDER — POTASSIUM CHLORIDE 10 MEQ/100ML IV SOLN
10.0000 meq | INTRAVENOUS | Status: AC
  Administered 2023-02-05 (×4): 10 meq via INTRAVENOUS
  Filled 2023-02-05 (×4): qty 100

## 2023-02-05 MED ORDER — LACTATED RINGERS IV BOLUS
500.0000 mL | Freq: Once | INTRAVENOUS | Status: AC
  Administered 2023-02-05: 500 mL via INTRAVENOUS

## 2023-02-05 MED ORDER — MIDAZOLAM HCL 2 MG/2ML IJ SOLN
0.5000 mg | Freq: Once | INTRAMUSCULAR | Status: AC
  Administered 2023-02-05: 0.5 mg via INTRAVENOUS
  Filled 2023-02-05: qty 2

## 2023-02-05 MED ORDER — HYDROCODONE-ACETAMINOPHEN 5-325 MG PO TABS
1.0000 | ORAL_TABLET | ORAL | Status: DC | PRN
  Administered 2023-02-05 – 2023-02-07 (×8): 1 via ORAL
  Filled 2023-02-05 (×9): qty 1

## 2023-02-05 MED ORDER — THIAMINE HCL 100 MG/ML IJ SOLN
500.0000 mg | Freq: Every day | INTRAVENOUS | Status: DC
  Administered 2023-02-05 – 2023-02-10 (×6): 500 mg via INTRAVENOUS
  Filled 2023-02-05 (×6): qty 5

## 2023-02-05 MED ORDER — HYDRALAZINE HCL 20 MG/ML IJ SOLN
10.0000 mg | Freq: Four times a day (QID) | INTRAMUSCULAR | Status: AC | PRN
  Administered 2023-02-05 (×2): 10 mg via INTRAVENOUS
  Filled 2023-02-05 (×2): qty 1

## 2023-02-05 MED ORDER — SODIUM CHLORIDE 0.9 % IV SOLN: INTRAVENOUS | Status: DC

## 2023-02-05 MED ORDER — LORAZEPAM 2 MG/ML IJ SOLN
0.5000 mg | Freq: Four times a day (QID) | INTRAMUSCULAR | Status: DC | PRN
  Administered 2023-02-05 – 2023-02-08 (×7): 0.5 mg via INTRAVENOUS
  Filled 2023-02-05 (×7): qty 1

## 2023-02-05 MED ORDER — CITALOPRAM HYDROBROMIDE 20 MG PO TABS
20.0000 mg | ORAL_TABLET | Freq: Every day | ORAL | Status: DC
  Administered 2023-02-05 – 2023-02-14 (×10): 20 mg via ORAL
  Filled 2023-02-05 (×10): qty 1

## 2023-02-05 MED ORDER — AMLODIPINE BESYLATE 5 MG PO TABS
5.0000 mg | ORAL_TABLET | Freq: Every day | ORAL | Status: DC
  Administered 2023-02-05 – 2023-02-06 (×2): 5 mg via ORAL
  Filled 2023-02-05 (×2): qty 1

## 2023-02-05 MED ORDER — HYDRALAZINE HCL 20 MG/ML IJ SOLN
10.0000 mg | Freq: Once | INTRAMUSCULAR | Status: AC
  Administered 2023-02-05: 10 mg via INTRAVENOUS
  Filled 2023-02-05: qty 1

## 2023-02-05 MED ORDER — METOPROLOL SUCCINATE ER 25 MG PO TB24
50.0000 mg | ORAL_TABLET | Freq: Every day | ORAL | Status: DC
  Administered 2023-02-05 – 2023-02-08 (×4): 50 mg via ORAL
  Filled 2023-02-05 (×4): qty 2

## 2023-02-05 MED ORDER — HYDROMORPHONE HCL 1 MG/ML IJ SOLN
0.5000 mg | INTRAMUSCULAR | Status: DC | PRN
  Administered 2023-02-05 – 2023-02-08 (×8): 0.5 mg via INTRAVENOUS
  Filled 2023-02-05 (×8): qty 1

## 2023-02-05 MED ORDER — KCL IN DEXTROSE-NACL 40-5-0.45 MEQ/L-%-% IV SOLN
INTRAVENOUS | Status: DC
  Filled 2023-02-05 (×3): qty 1000

## 2023-02-05 MED ORDER — STROKE: EARLY STAGES OF RECOVERY BOOK
Freq: Once | Status: DC
  Filled 2023-02-05: qty 1

## 2023-02-05 MED ORDER — FENTANYL CITRATE PF 50 MCG/ML IJ SOSY
12.5000 ug | PREFILLED_SYRINGE | INTRAMUSCULAR | Status: DC | PRN
  Administered 2023-02-05 (×3): 50 ug via INTRAVENOUS
  Filled 2023-02-05 (×3): qty 1

## 2023-02-05 MED ORDER — ISOSORBIDE MONONITRATE ER 30 MG PO TB24
15.0000 mg | ORAL_TABLET | Freq: Every day | ORAL | Status: DC
  Administered 2023-02-05 – 2023-02-14 (×9): 15 mg via ORAL
  Filled 2023-02-05 (×10): qty 1

## 2023-02-05 MED ORDER — ALBUTEROL SULFATE (2.5 MG/3ML) 0.083% IN NEBU: 2.5000 mg | INHALATION_SOLUTION | RESPIRATORY_TRACT | Status: DC | PRN

## 2023-02-05 MED ORDER — CHLORHEXIDINE GLUCONATE CLOTH 2 % EX PADS
6.0000 | MEDICATED_PAD | Freq: Every day | CUTANEOUS | Status: DC
  Administered 2023-02-05 – 2023-02-09 (×4): 6 via TOPICAL

## 2023-02-05 NOTE — Assessment & Plan Note (Addendum)
-   most likely multifactorial secondary to combination of dehydration secondary to decreased by mouth intake,    - Will rehydrate   - no evidence of  underlining infection   - Hold contributing medications   - if no improvement may need further imaging to evaluate for CNS pathology pathology such as MRI of the brain   - neurological exam appears to be nonfocal but patient unable to cooperate fully   - VBG no evidence of hypercarbia    - no history of liver disease ammonia pending

## 2023-02-05 NOTE — Progress Notes (Signed)
Initial Nutrition Assessment  DOCUMENTATION CODES:   Obesity unspecified  INTERVENTION:  - NPO at this time. Advance diet as medically appropriate.  - Recommend Boost Breeze po BID once diet advanced. Each supplement provides 250 kcal and 9 grams of protein - Recommend Multivitamin with minerals daily. - Monitor weight trends.   NUTRITION DIAGNOSIS:   Inadequate oral intake related to acute illness, nausea, vomiting, poor appetite as evidenced by per patient/family report, energy intake < 75% for > or equal to 1 month.  GOAL:   Patient will meet greater than or equal to 90% of their needs  MONITOR:   PO intake, Diet advancement, Labs, Weight trends  REASON FOR ASSESSMENT:   Consult Assessment of nutrition requirement/status  ASSESSMENT:   82 y.o. female with PMH significant of ulcerative colitis, CKD, fusiform aneurysm of ascending thoracic aorta who presented with  confusion, nausea and vomiting. Admitted for acute encephalopathy.   Patient in bed at time of visit, slightly confused but answering some questions appropriately. Daughters at bedside provided most of history.   UBW reported to be around 180#. They share that patient gained weight from fluid during admission in April but lost it once started on Lasix. However, over the past 2 months (shortly after that time) she has lost about 10#.  EMR confirms higher weight in April. Patient then weighed at 183# on 5/24 and now weighed at 176#. This indicates a 7# or 3.8% weight loss in ~6 weeks, which is not significant for the time frame.  Daughters indicate that weight loss is a result of inadequate oral intake secondary to intractable N/V over the past 2 months. Patient has typically consumed 3 meals a day at home. Usually toast in the morning, soup or a peanut butter sandwich or spaghetti for lunch, and a heat up meal for dinner. They state she has always eaten this way and has to avoid fresh fruits and vegetables due to her  ulcerative colitis.  However, over the past 2 months she has been eating only around 2 small meals a day. However, due to vomiting these often come back up afterwards. Has not been consuming any nutrition supplements at home.   Patient states she feels about the same currently as she has the past few weeks. Feels she could maybe try to eat/drink something but NPO at this time. Does not like Ensure/Boost as she doesn't like the taste. Daughters feel she would likely be willing to try Boost Breeze once diet advanced.   Medications reviewed and include: D5 @ 165mL/hr (provides 408 kcals over 24 hours), 100mg  thiamine, Reglan prn  Labs reviewed:  Na 131 K+ 2.1 Creatinine 1.06   NUTRITION - FOCUSED PHYSICAL EXAM:  Flowsheet Row Most Recent Value  Orbital Region No depletion  Upper Arm Region No depletion  Thoracic and Lumbar Region No depletion  Buccal Region No depletion  Temple Region No depletion  Clavicle Bone Region No depletion  Clavicle and Acromion Bone Region No depletion  Scapular Bone Region Unable to assess  Dorsal Hand No depletion  Patellar Region No depletion  Anterior Thigh Region No depletion  Posterior Calf Region No depletion  Edema (RD Assessment) None  Hair Reviewed  Eyes Reviewed  Mouth Reviewed  Skin Reviewed  Nails Reviewed       Diet Order:   Diet Order             Diet NPO time specified Except for: Sips with Meds  Diet effective now  EDUCATION NEEDS:  Education needs have been addressed  Skin:  Skin Assessment: Reviewed RN Assessment  Last BM:  7/3  Height:  Ht Readings from Last 1 Encounters:  02/04/23 5\' 4"  (1.626 m)   Weight:  Wt Readings from Last 1 Encounters:  02/05/23 80.2 kg    BMI:  Body mass index is 30.35 kg/m.  Estimated Nutritional Needs:  Kcal:  1750-1900 kcals Protein:  80-95 grams Fluid:  >/= 1.7L    Shelle Iron RD, LDN For contact information, refer to [facility].

## 2023-02-05 NOTE — ED Notes (Signed)
Family came out and informed RN that pt pulled her IV out.  Pt's did infact pull her IV out. Complete linen change was completed.    RN attempted to call report, RN is unable to take report.  Charge RN is in a rapid response and also unable to take report.

## 2023-02-05 NOTE — Assessment & Plan Note (Signed)
Continue Eliquis and metoprolol for able to tolerate

## 2023-02-05 NOTE — Progress Notes (Signed)
PT Cancellation Note  Patient Details Name: AYLINNE TURCZYN MRN: [id number] DOB: [birth year]   Cancelled Treatment:    Reason Eval/Treat Not Completed: Medical issues which prohibited therapy    Kati L Payson 02/05/2023, 9:00 AM Paulino Door, DPT Physical Therapist Acute Rehabilitation Services Office: [phone]

## 2023-02-05 NOTE — Progress Notes (Signed)
PROGRESS NOTE  Kara Bush WGN:[id number] DOB: [birth year] DOA: 02/04/2023 PCP: Jackelyn Poling, DO   LOS: 0 days   Brief Narrative / Interim history: 82 year old female with history of ulcerative colitis, CKD, ascending thoracic aneurysm comes into the hospital with confusion, nausea, vomiting.  Family tells me that she has been hospitalized in April for acute blood loss anemia requiring blood transfusions, underwent an EGD which showed hiatal hernia, and also a colonoscopy which showed stable UC comes to the hospital with progressive nausea over the last 2 months.  Since she was discharged she has been having progressive nausea, difficulties with poor p.o. intake, and an associated 10 pound weight loss.  Daughter also reports intermittent diarrhea but none currently.  Family also states that she has been more confused over the last 2 months, especially in the last several days has been having intermittent slurred speech and difficulty finding words  Subjective / 24h Interval events: Complains of significant weakness  Assesement and Plan: Principal Problem:   Acute encephalopathy Active Problems:   Chest wall pain   PAD (peripheral artery disease), aortic-iliac bypass 1991, mild carotid bruits   OSA (obstructive sleep apnea)   Chronic fatigue syndrome with fibromyalgia   Accelerated hypertension   Ulcerative colitis (HCC)   Atrial fibrillation (HCC)   CKD (chronic kidney disease) stage 3, GFR 30-59 ml/min (HCC)   Elevated troponin   Hypokalemia   Hypomagnesemia   Intractable nausea and vomiting   Prolonged QT interval   Metabolic alkalosis   Principal problem Intractable nausea, vomiting -ongoing for the past 2 months, following her most recent hospitalization.  Apparently family has been working with Dr. Janee Morn as an outpatient to get this under control.  This is associated with a 10 pound weight loss, and now with profound hypokalemia -Consult GI tomorrow, supportive treatment  for now -Of note, CT scan abdomen and pelvis negative for acute findings  Active problems Profound hypokalemia -replete aggressively  Chronic pain, fibromyalgia -resume home hydrocodone  Intermittent slurred speech, confusion -check B1 level.  Obtain MRI to rule out CVA  PAD, history of aortoiliac bypass 1991 -CT angiogram without acute findings  Ulcerative colitis - stable  PAF - continue metoprolol, eliquis  Scheduled Meds:  amLODipine  5 mg Oral Daily   apixaban  2.5 mg Oral BID   atorvastatin  20 mg Oral Daily   citalopram  20 mg Oral Daily   isosorbide mononitrate  15 mg Oral Daily   metoprolol succinate  50 mg Oral Daily   Continuous Infusions:  dextrose 5 % and 0.45 % NaCl with KCl 40 mEq/L 100 mL/hr at 02/05/23 0847   methocarbamol (ROBAXIN) IV Stopped (02/05/23 0733)   potassium chloride 10 mEq (02/05/23 0948)   thiamine (VITAMIN B1) injection     PRN Meds:.albuterol, hydrALAZINE, HYDROmorphone (DILAUDID) injection, labetalol, LORazepam, methocarbamol (ROBAXIN) IV, metoCLOPramide (REGLAN) injection  Current Outpatient Medications  Medication Instructions   amLODipine (NORVASC) 5 mg, Oral, Daily   apixaban (ELIQUIS) 2.5 mg, Oral, 2 times daily   atorvastatin (LIPITOR) 20 mg, Oral, Daily   b complex vitamins capsule 1 capsule, Oral, Daily   betamethasone dipropionate 0.05 % cream 1 Application, Topical, Daily   citalopram (CELEXA) 20 MG tablet 1 tablet, Oral, Daily   clobetasol ointment (TEMOVATE) 0.05 % 1 Application, Topical, 2 times daily   ezetimibe (ZETIA) 10 mg, Oral, Daily   furosemide (LASIX) 40 mg, Oral, 3 times weekly, Increase to every day as needed for weight gain of 2lbs overnight  or 5lbs in 1 week   HYDROcodone-acetaminophen (NORCO/VICODIN) 5-325 MG tablet 1 tablet, Oral, 4 times daily PRN, May Take an extra 0.5 to one tablet when pain is severe.   isosorbide mononitrate (IMDUR) 15 mg, Oral, Daily   methocarbamol (ROBAXIN) 500 MG tablet TAKE 1 TABLET  BY MOUTH EVERY 8 HOURS AS NEEDED FOR MUSCLE SPASM   metoprolol succinate (TOPROL-XL) 50 MG 24 hr tablet TAKE ONE TABLET BY MOUTH ONCE daily   Multiple Vitamin (MULITIVITAMIN WITH MINERALS) TABS 1 tablet, Oral, Daily,     ondansetron (ZOFRAN) 4 mg, Oral, Every 8 hours PRN   potassium chloride (KLOR-CON M) 10 MEQ tablet 10 mEq, Oral, 3 times weekly, And with extra lasix as needed.   zolpidem (AMBIEN CR) 12.5 mg, Oral, Daily at bedtime    Diet Orders (From admission, onward)     Start     Ordered   02/05/23 0355  Diet NPO time specified Except for: Sips with Meds  Diet effective now       Question:  Except for  Answer:  Sips with Meds   02/05/23 0354            DVT prophylaxis: apixaban (ELIQUIS) tablet 2.5 mg Start: 02/05/23 1000 SCDs Start: 02/05/23 0355 apixaban (ELIQUIS) tablet 2.5 mg   Lab Results  Component Value Date   PLT 231 02/05/2023      Code Status: DNR  Family Communication: Discussed with 2 daughters over the phone  Status is: Inpatient Remains inpatient appropriate because: severity of illness  Level of care: Stepdown  Consultants:  none  Objective: Vitals:   02/05/23 0537 02/05/23 0600 02/05/23 0630 02/05/23 0753  BP: (!) 194/67 (!) 191/57 (!) 166/89 (!) 161/67  Pulse: 86 90 98   Resp: (!) 21 (!) 22 17   Temp:    99.6 F (37.6 C)  TempSrc:    Oral  SpO2: 99% 98% 98%   Weight:      Height:        Intake/Output Summary (Last 24 hours) at 02/05/2023 1012 Last data filed at 02/05/2023 0756 Gross per 24 hour  Intake 308.85 ml  Output --  Net 308.85 ml   Wt Readings from Last 3 Encounters:  02/05/23 80.2 kg  01/23/23 84.3 kg  12/26/22 83.5 kg    Examination:  Constitutional: NAD Eyes: no scleral icterus ENMT: Mucous membranes are moist.  Neck: normal, supple Respiratory: clear to auscultation bilaterally, no wheezing, no crackles.  Cardiovascular: Regular rate and rhythm, no murmurs / rubs / gallops. Abdomen: non distended, no  tenderness. Bowel sounds positive.  Musculoskeletal: no clubbing / cyanosis.    Data Reviewed: I have independently reviewed following labs and imaging studies   CBC Recent Labs  Lab 02/04/23 2115 02/05/23 0309 02/05/23 0537  WBC 13.5* 14.4* 13.0*  HGB 14.1 12.2 11.5*  HCT 47.2* 40.2 38.5  PLT 304 268 231  MCV 70.4* 70.7* 70.9*  MCH 21.0* 21.4* 21.2*  MCHC 29.9* 30.3 29.9*  RDW 19.2* 18.2* 18.4*  LYMPHSABS  --  1.6  --   MONOABS  --  0.8  --   EOSABS  --  0.0  --   BASOSABS  --  0.1  --     Recent Labs  Lab 02/04/23 2115 02/04/23 2125 02/04/23 2201 02/04/23 2202 02/05/23 0308 02/05/23 0310 02/05/23 0314 02/05/23 0537  NA 133*  --   --   --   --   --   --  131*  K  2.1*  --   --   --   --   --   --  2.1*  CL 83*  --   --   --   --   --   --  85*  CO2 33*  --   --   --   --   --   --  34*  GLUCOSE 141*  --   --   --   --   --   --  150*  BUN 19  --   --   --   --   --   --  15  CREATININE 1.35*  --   --   --   --   --   --  1.06*  CALCIUM 9.7  --   --   --   --   --   --  8.3*  AST  --   --  28  --   --   --   --  25  ALT  --   --  18  --   --   --   --  17  ALKPHOS  --   --  56  --   --   --   --  43  BILITOT  --   --  0.8  --   --   --   --  0.9  ALBUMIN  --   --  4.3  --   --   --   --  3.6  MG  --  1.5*  --   --   --   --   --  1.8  LATICACIDVEN  --   --   --  3.2*  --   --  1.6  --   TSH  --   --   --   --   --  0.733  --   --   AMMONIA  --   --   --   --  16  --   --   --     ------------------------------------------------------------------------------------------------------------------ Recent Labs    02/05/23 0310  CHOL 183  HDL 53  LDLCALC 111*  TRIG 94  CHOLHDL 3.5    No results found for: "HGBA1C" ------------------------------------------------------------------------------------------------------------------ Recent Labs    02/05/23 0310  TSH 0.733    Cardiac Enzymes No results for input(s): "CKMB", "TROPONINI", "MYOGLOBIN" in the  last 168 hours.  Invalid input(s): "CK" ------------------------------------------------------------------------------------------------------------------    Component Value Date/Time   BNP 96.8 10/23/2021 1417   BNP 263.1 (H) 02/26/2021 1314    CBG: Recent Labs  Lab 02/04/23 2151 02/05/23 0445 02/05/23 0915  GLUCAP 166* 142* 158*    Recent Results (from the past 240 hour(s))  SARS Coronavirus 2 by RT PCR (hospital order, performed in [facility] hospital lab) *cepheid single result test* Anterior Nasal Swab     Status: None   Collection Time: 02/05/23  2:44 AM   Specimen: Anterior Nasal Swab  Result Value Ref Range Status   SARS Coronavirus 2 by RT PCR NEGATIVE NEGATIVE Final    Comment: (NOTE) SARS-CoV-2 target nucleic acids are NOT DETECTED.  The SARS-CoV-2 RNA is generally detectable in upper and lower respiratory specimens during the acute phase of infection. The lowest concentration of SARS-CoV-2 viral copies this assay can detect is 250 copies / mL. A negative result does not preclude SARS-CoV-2 infection and should not be used as the sole basis for treatment or other patient management decisions.  A negative result may occur with improper specimen collection / handling, submission of specimen other than nasopharyngeal swab, presence of viral mutation(s) within the areas targeted by this assay, and inadequate number of viral copies (<250 copies / mL). A negative result must be combined with clinical observations, patient history, and epidemiological information.  Fact Sheet for Patients:   RoadLapTop.co.za  Fact Sheet for Healthcare Providers: http://kim-miller.com/  This test is not yet approved or  cleared by the [place] FDA and has been authorized for detection and/or diagnosis of SARS-CoV-2 by FDA under an Emergency Use Authorization (EUA).  This EUA will remain in effect (meaning this test can be used) for  the duration of the COVID-19 declaration under Section 564(b)(1) of the Act, 21 U.S.C. section 360bbb-3(b)(1), unless the authorization is terminated or revoked sooner.  Performed at [facility] [address], [place], [place] [address]      Radiology Studies: CT Angio Chest/Abd/Pel for Dissection W and/or W/WO  Result Date: 02/04/2023 CLINICAL DATA:  Altered mental status. EXAM: CT ANGIOGRAPHY CHEST, ABDOMEN AND PELVIS TECHNIQUE: Non-contrast CT of the chest was initially obtained. Multidetector CT imaging through the chest, abdomen and pelvis was performed using the standard protocol during bolus administration of intravenous contrast. Multiplanar reconstructed images and MIPs were obtained and reviewed to evaluate the vascular anatomy. RADIATION DOSE REDUCTION: This exam was performed according to the departmental dose-optimization program which includes automated exposure control, adjustment of the mA and/or kV according to patient size and/or use of iterative reconstruction technique. CONTRAST:  80mL OMNIPAQUE IOHEXOL 350 MG/ML SOLN COMPARISON:  Chest CT dated 09/10/2020 and CT of the abdomen pelvis dated 05/19/2022. FINDINGS: CTA CHEST FINDINGS Cardiovascular: Borderline cardiomegaly. No pericardial effusion. Three-vessel coronary vascular calcification and postsurgical changes of CABG. Moderate atherosclerotic calcification of the thoracic aorta. Fusiform aneurysm of the ascending aorta measuring up to 4.2 cm in diameter. This is relatively similar to prior CT. No aortic dissection. The origins of the great vessels of the aortic arch and the central pulmonary arteries appear patent for the degree of opacification. Mediastinum/Nodes: No hilar or mediastinal adenopathy. Small hiatal hernia. The esophagus is grossly unremarkable. No mediastinal fluid collection. Lungs/Pleura: Faint diffuse hazy densities throughout the lungs may represent atelectasis or areas of air trapping  related to underlying small airways versus small vessel disease. No focal consolidation, pleural effusion, or pneumothorax. The central airways are patent. Musculoskeletal: Median sternotomy wires. No acute osseous pathology. Review of the MIP images confirms the above findings. CTA ABDOMEN AND PELVIS FINDINGS VASCULAR Aorta: Advanced atherosclerotic calcification of the abdominal aorta. Postsurgical changes of aorto bi iliac bypass. There is advanced atherosclerotic calcification with luminal narrowing of the abdominal aorta at the level of the renal arteries as well as just above the bypass graft. No aortic dissection. No periaortic fluid collection. Celiac: Atherosclerotic calcification of the origin of the celiac axis. The celiac trunk and its major branches remain patent. SMA: Atherosclerotic calcification of the origin of the SMA. The SMA remains patent. Renals: Advanced atherosclerotic calcification of the origin of the left renal artery. There is significantly diminished flow in the left renal artery. The right renal artery is patent. IMA: The IMA has been previously ligated and not visualized. Inflow: The bypass graft and the external iliac arteries are patent. There is moderate atherosclerotic calcification of the internal iliac arteries. The internal iliac arteries also remain patent. Veins: The IVC is grossly unremarkable.  No portal venous gas. Review of the MIP images confirms  the above findings. NON-VASCULAR No intra-abdominal free air or free fluid. Hepatobiliary: The liver is unremarkable. No biliary dilatation. Cholecystectomy. Dilatation of the common bile duct, post cholecystectomy. No retained calcified stone noted in the central CBD. Pancreas: Unremarkable. No pancreatic ductal dilatation or surrounding inflammatory changes. Spleen: Several small splenic cysts. Indeterminate 2 cm hypoenhancing lesion in the medial spleen which appears larger compared to prior CT. This is not evaluated on this  exam. Further evaluation with ultrasound on a nonemergent basis recommended. Adrenals/Urinary Tract: The adrenal glands are unremarkable. Atrophic left kidney. Punctate nonobstructing left renal interpolar calculus. There is significant decreased perfusion to the left kidney. There is no hydronephrosis on the right. Several small right renal cyst. The visualized ureters and urinary bladder appear unremarkable. Stomach/Bowel: Small hiatal hernia. Sigmoid diverticulosis and scattered colonic diverticula without active inflammatory changes. There is no bowel obstruction or active inflammation. The appendix is normal. Lymphatic: No adenopathy. Reproductive: The uterus is grossly unremarkable. No adnexal masses. Other: Small fat containing left inguinal hernia. Status post prior ventral hernia repair. Musculoskeletal: Osteopenia with degenerative changes of the spine. No acute osseous pathology. Review of the MIP images confirms the above findings. IMPRESSION: 1. No acute intrathoracic, abdominal, or pelvic pathology. No aortic dissection. 2. 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 Recommend annual imaging followup by CTA or MRA. This recommendation follows 2010 ACCF/AHA/AATS/ACR/ASA/SCA/SCAI/SIR/STS/SVM Guidelines for the Diagnosis and Management of Patients with Thoracic Aortic Disease. Circulation. 2010; 121: Z610-R604. Aortic aneurysm NOS (ICD10-I71.9) 3. 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 4. Atrophic left kidney with significantly diminished perfusion. 5. Colonic diverticulosis. No bowel obstruction. Normal appendix. 6. Indeterminate 2 cm hypoenhancing lesion in the medial spleen which appears larger compared to prior CT. 7.  Aortic Atherosclerosis (ICD10-I70.0). Electronically Signed   By: Elgie Collard M.D.   On: 02/04/2023 23:04   CT ANGIO HEAD NECK W WO CM  Result Date: 02/04/2023 CLINICAL DATA:  Slurred speech EXAM: CT ANGIOGRAPHY HEAD AND NECK WITH AND WITHOUT CONTRAST TECHNIQUE:  Multidetector CT imaging of the head and neck was performed using the standard protocol during bolus administration of intravenous contrast. Multiplanar CT image reconstructions and MIPs were obtained to evaluate the vascular anatomy. Carotid stenosis measurements (when applicable) are obtained utilizing NASCET criteria, using the distal internal carotid diameter as the denominator. RADIATION DOSE REDUCTION: This exam was performed according to the departmental dose-optimization program which includes automated exposure control, adjustment of the mA and/or kV according to patient size and/or use of iterative reconstruction technique. CONTRAST:  80mL OMNIPAQUE IOHEXOL 350 MG/ML SOLN COMPARISON:  None Available. FINDINGS: CT HEAD FINDINGS Brain: There is no mass, hemorrhage or extra-axial collection. The size and configuration of the ventricles and extra-axial CSF spaces are normal. There is no acute or chronic infarction. There is hypoattenuation of the periventricular white matter, most commonly indicating chronic ischemic microangiopathy. Skull: The visualized skull base, calvarium and extracranial soft tissues are normal. Sinuses/Orbits: No fluid levels or advanced mucosal thickening of the visualized paranasal sinuses. No mastoid or middle ear effusion. The orbits are normal. CTA NECK FINDINGS SKELETON: There is no bony spinal canal stenosis. No lytic or blastic lesion. OTHER NECK: Normal pharynx, larynx and major salivary glands. No cervical lymphadenopathy. Unremarkable thyroid gland. UPPER CHEST: No pneumothorax or pleural effusion. No nodules or masses. AORTIC ARCH: There is calcific atherosclerosis of the aortic arch. Conventional 3 vessel aortic branching pattern. RIGHT CAROTID SYSTEM: No dissection, occlusion or aneurysm. There is calcified atherosclerosis extending into the  proximal ICA, resulting in 50% stenosis. LEFT CAROTID SYSTEM: No dissection, occlusion or aneurysm. There is calcified  atherosclerosis extending into the proximal ICA, resulting in less than 50% stenosis. VERTEBRAL ARTERIES: Left dominant configuration.There is no dissection, occlusion or flow-limiting stenosis to the skull base (V1-V3 segments). CTA HEAD FINDINGS POSTERIOR CIRCULATION: --Vertebral arteries: Normal V4 segments. --Inferior cerebellar arteries: Normal. --Basilar artery: Normal. --Superior cerebellar arteries: Normal. --Posterior cerebral arteries (PCA): Normal. ANTERIOR CIRCULATION: --Intracranial internal carotid arteries: Normal. --Anterior cerebral arteries (ACA): Normal. Both A1 segments are present. Patent anterior communicating artery (a-comm). --Middle cerebral arteries (MCA): Normal. VENOUS SINUSES: As permitted by contrast timing, patent. ANATOMIC VARIANTS: Fetal origin of the right posterior cerebral artery. Review of the MIP images confirms the above findings. IMPRESSION: 1. No emergent large vessel occlusion or high-grade stenosis of the intracranial arteries. 2. Bilateral carotid bifurcation atherosclerosis resulting in 50% stenosis of the proximal right internal carotid artery and slightly less than 50% stenosis of the proximal left internal carotid artery. 3. Chronic ischemic microangiopathy. Aortic Atherosclerosis (ICD10-I70.0). Electronically Signed   By: Deatra Robinson M.D.   On: 02/04/2023 22:56   DG Chest Portable 1 View  Result Date: 02/04/2023 CLINICAL DATA:  Code STEMI, chest pain EXAM: PORTABLE CHEST 1 VIEW COMPARISON:  Radiographs 11/22/2022 FINDINGS: Sternotomy and CABG. Defibrillator pads overlie the left chest. Stable cardiomediastinal silhouette. No focal consolidation, pleural effusion, or pneumothorax. No displaced rib fractures. IMPRESSION: No active disease. Electronically Signed   By: Minerva Fester M.D.   On: 02/04/2023 22:05     Pamella Pert, MD, PhD [facility]  Between 7 am - 7 pm I am available, please contact me via Amion (for emergencies) or Securechat (non  urgent messages)  Between 7 pm - 7 am I am not available, please contact night coverage MD/APP via Amion

## 2023-02-05 NOTE — Assessment & Plan Note (Signed)
-   will monitor on tele avoid QT prolonging medications, rehydrate correct electrolytes  

## 2023-02-05 NOTE — Assessment & Plan Note (Signed)
Severe will replace Check and replace magnesium and phosphate Given potentially poor p.o. intake will administer IV thiamine

## 2023-02-05 NOTE — H&P (Signed)
Kara Bush:[id number] DOB: 07/19/1941 DOA: 02/04/2023     PCP: Jackelyn Poling, DO   Outpatient Specialists:  CARDS:  Dr. Chrystie Nose, MD   GI  Dr.  Elnoria Howard    Patient arrived to ER on 02/04/23 at 2049 Referred by Attending Loetta Rough, MD   Patient coming from:    home Lives  With family    Chief Complaint:   Chief Complaint  Patient presents with   Nausea   Fatigue   Weakness    HPI: Kara Bush is a 82 y.o. 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   Presented with  confusion, nausea and vomiting Pt had recent admit in April for anemia, has Ulcertive colitis that is stable Has not been feeling well, nauseaous and vomiting  Reports epigastric pain, CP  Confused not acting like self  Initial ECG had changes and code stemi was called  Cardiology canceled STEMI trop 45 No UTI     Denies significant ETOH intake   Does not smoke   Lab Results  Component Value Date   SARSCOV2NAA NEGATIVE 02/26/2021   SARSCOV2NAA Not Detected 04/20/2019        Regarding pertinent Chronic problems:     Hyperlipidemia -  on statins Lipitor (atorvastatin)  Lipid Panel     Component Value Date/Time   CHOL 141 10/13/2019 0817   TRIG 128 10/13/2019 0817   HDL 56 10/13/2019 0817   CHOLHDL 2.5 10/13/2019 0817   LDLCALC 63 10/13/2019 0817   LABVLDL 22 10/13/2019 0817     HTN on Norvasc, imdur   chronic CHF diastolic  - last echo  Recent Results (from the past 81191 hour(s))  ECHOCARDIOGRAM COMPLETE   Collection Time: 11/23/22  4:46 PM  Result Value   Weight 3,174.62   Height 64   BP 146/56   S' Lateral 2.90   AR max vel 0.95   AV Area VTI 0.88   AV Mean grad 15.0   AV Peak grad 26.4   Ao pk vel 2.57   Area-P 1/2 5.13   MV M vel 4.63   AV Area mean vel 0.90   MV Peak grad 85.7   Est EF 60   Narrative      ECHOCARDIOGRAM REPORT     IMPRESSIONS    1. Left  ventricular ejection fraction, by estimation, is 60%. The left ventricle has normal function. The left ventricle has no regional wall motion abnormalities. There is mild left ventricular hypertrophy. Left ventricular diastolic parameters are  indeterminate.  2. Right ventricular systolic function is normal. The right ventricular size is mildly enlarged. There is severely elevated pulmonary artery systolic pressure. The estimated right ventricular systolic pressure is 61.9 mmHg.  3. The mitral valve is grossly normal. Mild mitral valve regurgitation.  4. Tricuspid valve regurgitation is moderate to severe.  5. Fusion of right and left coronary cusps with raphe present.. 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 There is moderate calcification of the aortic valve. There is mild thickening of the aortic valve. Aortic valve regurgitation is mild. Mild aortic valve  stenosis. Aortic valve mean gradient measures 15.0 mmHg. Aortic valve Vmax measures 2.57 m/s. Doppler alignment suboptimal, gradient and LVOT TVI may be underestimated.  6. There is borderline dilatation of the ascending aorta, measuring 40 mm.  7. The inferior vena cava is normal in size with <50% respiratory variability, suggesting right atrial pressure of  8 mmHg.           CAD  - On Aspirin, statin, betablocker,                 - followed by cardiology                - last cardiac cath          OSA -on nocturnal oxygen     A. 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  Lab Results  Component Value Date   CREATININE 1.35 (H) 02/04/2023   CREATININE 1.38 (H) 12/26/2022   CREATININE 1.74 (H) 11/28/2022     Chronic anemia - baseline hg Hemoglobin & Hematocrit  Recent Labs    11/27/22 1127 11/28/22 0301  02/04/23 2115  HGB 8.3* 8.3* 14.1   Iron/TIBC/Ferritin/ %Sat    Component Value Date/Time   IRON 73 11/24/2022 1802   TIBC 479 (H) 11/24/2022 1802   FERRITIN 7 (L) 11/24/2022 1802   IRONPCTSAT 15 11/24/2022 1802      While in ER: Clinical Course as of 02/05/23 0021  Wed Feb 04, 2023  2158 D/w STEMI Dr. Melvenia Needles who does not think she is having specific STEMI at this time, will continue further w/u.  [HN]  2159 Potassium(!!): 2.1 Will replete; likely d/ tGI losses in s/o N/V.  EKG with diffuse ST depressions and prolonged QT is consistent with hypokalemia. [HN]  2226 CXR nothing acute [HN]  2226 Magnesium(!): 1.5 Repleting [HN]  2226 WBC(!): 13.5 +Leukocytosis [HN]  2226 Hemoglobin: 14.1 BL 7-8, likely volume contraction significant  [HN]  2234 Lactic Acid, Venous(!!): 3.2 Fluids hung [HN]  2256 Troponin I (High Sensitivity)(!): 45 Mild elevation [HN]  2314 CT Angio Chest/Abd/Pel for Dissection W and/or W/WO 1. No acute intrathoracic, abdominal, or pelvic pathology. No aortic dissection. 2. 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 Recommend annual imaging followup by CTA or MRA. This recommendation follows 2010 ACCF/AHA/AATS/ACR/ASA/SCA/SCAI/SIR/STS/SVM Guidelines for the Diagnosis and Management of Patients with Thoracic Aortic Disease. Circulation. 2010; 121: Z308-M578. Aortic aneurysm NOS (ICD10-I71.9) 3. 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 4. Atrophic left kidney with significantly diminished perfusion. 5. Colonic diverticulosis. No bowel obstruction. Normal appendix. 6. Indeterminate 2 cm hypoenhancing lesion in the medial spleen which appears larger compared to prior CT. 7.  Aortic Atherosclerosis (ICD10-I70.0).   [HN]  2314 CT ANGIO HEAD NECK W WO CM No LVO or acute abnormalities [HN]  2326 Imaging wihtout any significant acute or surgical abnormalities.  Patient with hypokalemia, hypomagnesemia, respiratory alkalosis, lactic acidosis,  leukocytosis.  Will obtain urine via pure wick, nursing working on that now.  Hepatic function panel and repeat troponin also in process.  Will order C. difficile and GI PCR panel as well as a possible cause of nausea vomiting diarrhea.  Will consult hospitalist for admission. [HN]    Clinical Course User Index [HN] Loetta Rough, MD       Lab Orders         Basic metabolic panel         CBC         Urinalysis, Routine w reflex microscopic -Urine,  Clean Catch         Lipase, blood         Hepatic function panel         Magnesium         Lactic acid, plasma         Blood gas, venous (at [facility] and [facility])         CBG monitoring, ED      CTA HEAD. No emergent large vessel occlusion or high-grade stenosis of the intracranial arteries. 2. Bilateral carotid bifurcation atherosclerosis resulting in 50% stenosis of the proximal right internal carotid artery and slightly less than 50% stenosis of the proximal left internal carotid artery. 3. Chronic ischemic microangiopathy.    CXR -  NON acute     CTAabd/pelvis chest -  non acute, no PE, no evidence of infiltrate  Following Medications were ordered in ER: Medications  aspirin EC tablet 325 mg (325 mg Oral Given 02/04/23 2209)  potassium chloride 10 mEq in 100 mL IVPB (10 mEq Intravenous New Bag/Given 02/04/23 2324)  magnesium sulfate IVPB 2 g 50 mL (2 g Intravenous New Bag/Given 02/04/23 2317)  lactated ringers bolus 500 mL (has no administration in time range)  midazolam (VERSED) injection 0.5 mg (has no administration in time range)  ondansetron (ZOFRAN) injection 4 mg (4 mg Intravenous Given 02/04/23 2155)  iohexol (OMNIPAQUE) 350 MG/ML injection 80 mL (80 mLs Intravenous Contrast Given 02/04/23 2228)  lactated ringers bolus 500 mL (500 mLs Intravenous Bolus 02/04/23 2224)  fentaNYL (SUBLIMAZE) injection 12.5 mcg (12.5 mcg Intravenous Given 02/04/23 2220)    _______________________________________________________ ER Provider Called:    Cardiology   Dr. Nolon Stalls They Recommend admit to medicine    Non stemi   ED Triage Vitals  Enc Vitals Group     BP 02/04/23 2101 (!) 172/96     Pulse Rate 02/04/23 2101 60     Resp 02/04/23 2101 18     Temp 02/04/23 2101 97.9 F (36.6 C)     Temp Source 02/04/23 2101 Oral     SpO2 02/04/23 2101 100 %     Weight 02/04/23 2101 176 lb (79.8 kg)     Height 02/04/23 2101 5\' 4"  (1.626 m)     Head Circumference --      Peak Flow --      Pain Score 02/04/23 2100 8     Pain Loc --      Pain Edu? --      Excl. in GC? --   TMAX(24)@     _________________________________________ Significant initial  Findings: Abnormal Labs Reviewed  BASIC METABOLIC PANEL - Abnormal; Notable for the following components:      Result Value   Sodium 133 (*)    Potassium 2.1 (*)    Chloride 83 (*)    CO2 33 (*)    Glucose, Bld 141 (*)    Creatinine, Ser 1.35 (*)    GFR, Estimated 39 (*)    Anion gap 17 (*)    All other components within normal limits  CBC - Abnormal; Notable for the following components:   WBC 13.5 (*)    RBC 6.70 (*)    HCT 47.2 (*)    MCV 70.4 (*)    MCH 21.0 (*)    MCHC 29.9 (*)    RDW 19.2 (*)    All other components within normal limits  URINALYSIS, ROUTINE W REFLEX MICROSCOPIC - Abnormal; Notable for the following components:   Color, Urine  STRAW (*)    Hgb urine dipstick SMALL (*)    Leukocytes,Ua TRACE (*)    Bacteria, UA RARE (*)    All other components within normal limits  LIPASE, BLOOD - Abnormal; Notable for the following components:   Lipase 59 (*)    All other components within normal limits  HEPATIC FUNCTION PANEL - Abnormal; Notable for the following components:   Total Protein 8.3 (*)    All other components within normal limits  MAGNESIUM - Abnormal; Notable for the following components:   Magnesium 1.5 (*)    All other components within normal limits  LACTIC ACID, PLASMA - Abnormal; Notable for the following components:   Lactic Acid, Venous 3.2 (*)     All other components within normal limits  BLOOD GAS, VENOUS - Abnormal; Notable for the following components:   pH, Ven 7.66 (*)    pCO2, Ven 40 (*)    pO2, Ven 52 (*)    Bicarbonate 45.1 (*)    Acid-Base Excess 22.1 (*)    All other components within normal limits  CBG MONITORING, ED - Abnormal; Notable for the following components:   Glucose-Capillary 166 (*)    All other components within normal limits  TROPONIN I (HIGH SENSITIVITY) - Abnormal; Notable for the following components:   Troponin I (High Sensitivity) 45 (*)    All other components within normal limits      _________________________ Troponin   Cardiac Panel (last 3 results) Recent Labs    02/04/23 2125 02/04/23 2327  TROPONINIHS 45* 44*     ECG: Ordered Personally reviewed and interpreted by me showing: HR : 102 Rhythm: Sinus tachycardia Multiple premature complexes, vent & supraven Probable left atrial enlargement LVH with secondary repolarization abnormality Anterior Q waves, possibly due to LVH ST depr diffusely Prolonged QT interval QTC 515  BNP (last 3 results) No results for input(s): "BNP" in the last 8760 hours.   COVID-19 Labs  No results for input(s): "DDIMER", "FERRITIN", "LDH", "CRP" in the last 72 hours.  Lab Results  Component Value Date   SARSCOV2NAA NEGATIVE 02/26/2021   SARSCOV2NAA Not Detected 04/20/2019     ____________________ This patient meets SIRS Criteria and may be septic. SIRS = Systemic Inflammatory Response Syndrome  Order a lactic acid level if needed AND/OR Initiate the sepsis protocol with the attached order set OR Click "Treating Associated Infection or Illness" if the patient is being treated for an infection that is a known cause of these abnormalities     The recent clinical data is shown below. Vitals:   02/04/23 2101  BP: (!) 172/96  Pulse: 60  Resp: 18  Temp: 97.9 F (36.6 C)  TempSrc: Oral  SpO2: 100%  Weight: 79.8 kg  Height: 5\' 4"   (1.626 m)    WBC     Component Value Date/Time   WBC 13.5 (H) 02/04/2023 2115   LYMPHSABS 1.9 11/25/2022 0301   MONOABS 1.2 (H) 11/25/2022 0301   EOSABS 0.2 11/25/2022 0301   BASOSABS 0.1 11/25/2022 0301    Lactic Acid, Venous    Component Value Date/Time   LATICACIDVEN 3.2 (HH) 02/04/2023 2202     Lactic Acid, Venous    Component Value Date/Time   LATICACIDVEN 3.2 (HH) 02/04/2023 2202    Procalcitonin   Ordered      UA   no evidence of UTI     Urine analysis:    Component Value Date/Time   COLORURINE STRAW (A) 02/04/2023 2328  APPEARANCEUR CLEAR 02/04/2023 2328   LABSPEC 1.017 02/04/2023 2328   PHURINE 7.0 02/04/2023 2328   GLUCOSEU NEGATIVE 02/04/2023 2328   HGBUR SMALL (A) 02/04/2023 2328   BILIRUBINUR NEGATIVE 02/04/2023 2328   KETONESUR NEGATIVE 02/04/2023 2328   PROTEINUR NEGATIVE 02/04/2023 2328   NITRITE NEGATIVE 02/04/2023 2328   LEUKOCYTESUR TRACE (A) 02/04/2023 2328    Venous  Blood Gas result:  pH  7.66 High Panic   Acid-Base Excess 22.1 High  mmol/L   pCO2, Ven 40 Low  mmHg O2 Saturation 90.7 %  pO2, Ven 52 High  mmHg     __________________________________________________________ Recent Labs  Lab 02/04/23 2115 02/04/23 2125  NA 133*  --   K 2.1*  --   CO2 33*  --   GLUCOSE 141*  --   BUN 19  --   CREATININE 1.35*  --   CALCIUM 9.7  --   MG  --  1.5*    Cr  stable,    Lab Results  Component Value Date   CREATININE 1.35 (H) 02/04/2023   CREATININE 1.38 (H) 12/26/2022   CREATININE 1.74 (H) 11/28/2022    Recent Labs  Lab 02/04/23 2201  AST 28  ALT 18  ALKPHOS 56  BILITOT 0.8  PROT 8.3*  ALBUMIN 4.3   Lab Results  Component Value Date   CALCIUM 9.7 02/04/2023   PHOS 3.8 11/28/2022    Plt: Lab Results  Component Value Date   PLT 304 02/04/2023       Recent Labs  Lab 02/04/23 2115  WBC 13.5*  HGB 14.1  HCT 47.2*  MCV 70.4*  PLT 304    HG/HCT  Up from baseline see below    Component Value Date/Time   HGB  14.1 02/04/2023 2115   HCT 47.2 (H) 02/04/2023 2115   MCV 70.4 (L) 02/04/2023 2115      Recent Labs  Lab 02/04/23 2125  LIPASE 59*   No results for input(s): "AMMONIA" in the last 168 hours.    .lab  _______________________________________________ Hospitalist was called for admission for   Hypokalemia    Dehydration    Hypomagnesemia Dehydration and acute encephalopathy    The following Work up has been ordered so far:  Orders Placed This Encounter  Procedures   DG Chest Portable 1 View   CT ANGIO HEAD NECK W WO CM   CT Angio Chest/Abd/Pel for Dissection W and/or W/WO   Basic metabolic panel   CBC   Urinalysis, Routine w reflex microscopic -Urine, Clean Catch   Lipase, blood   Hepatic function panel   Magnesium   Lactic acid, plasma   Blood gas, venous (at [facility])   Document Height and Actual Weight   Consult to hospitalist   CBG monitoring, ED   ED EKG   EKG 12-Lead   EKG 12-Lead     OTHER Significant initial  Findings:  labs showing:     DM  labs:  HbA1C: No results for input(s): "HGBA1C" in the last 8760 hours.     CBG (last 3)  Recent Labs    02/04/23 2151  GLUCAP 166*          Cultures: No results found for: "SDES", "SPECREQUEST", "CULT", "REPTSTATUS"   Radiological Exams on Admission: CT Angio Chest/Abd/Pel for Dissection W and/or W/WO  Result Date: 02/04/2023 CLINICAL DATA:  Altered mental status. EXAM: CT ANGIOGRAPHY CHEST, ABDOMEN AND PELVIS TECHNIQUE: Non-contrast CT of the chest was initially obtained. Multidetector CT imaging  through the chest, abdomen and pelvis was performed using the standard protocol during bolus administration of intravenous contrast. Multiplanar reconstructed images and MIPs were obtained and reviewed to evaluate the vascular anatomy. RADIATION DOSE REDUCTION: This exam was performed according to the departmental dose-optimization program which includes automated exposure control, adjustment of the mA and/or  kV according to patient size and/or use of iterative reconstruction technique. CONTRAST:  80mL OMNIPAQUE IOHEXOL 350 MG/ML SOLN COMPARISON:  Chest CT dated 09/10/2020 and CT of the abdomen pelvis dated 05/19/2022. FINDINGS: CTA CHEST FINDINGS Cardiovascular: Borderline cardiomegaly. No pericardial effusion. Three-vessel coronary vascular calcification and postsurgical changes of CABG. Moderate atherosclerotic calcification of the thoracic aorta. Fusiform aneurysm of the ascending aorta measuring up to 4.2 cm in diameter. This is relatively similar to prior CT. No aortic dissection. The origins of the great vessels of the aortic arch and the central pulmonary arteries appear patent for the degree of opacification. Mediastinum/Nodes: No hilar or mediastinal adenopathy. Small hiatal hernia. The esophagus is grossly unremarkable. No mediastinal fluid collection. Lungs/Pleura: Faint diffuse hazy densities throughout the lungs may represent atelectasis or areas of air trapping related to underlying small airways versus small vessel disease. No focal consolidation, pleural effusion, or pneumothorax. The central airways are patent. Musculoskeletal: Median sternotomy wires. No acute osseous pathology. Review of the MIP images confirms the above findings. CTA ABDOMEN AND PELVIS FINDINGS VASCULAR Aorta: Advanced atherosclerotic calcification of the abdominal aorta. Postsurgical changes of aorto bi iliac bypass. There is advanced atherosclerotic calcification with luminal narrowing of the abdominal aorta at the level of the renal arteries as well as just above the bypass graft. No aortic dissection. No periaortic fluid collection. Celiac: Atherosclerotic calcification of the origin of the celiac axis. The celiac trunk and its major branches remain patent. SMA: Atherosclerotic calcification of the origin of the SMA. The SMA remains patent. Renals: Advanced atherosclerotic calcification of the origin of the left renal artery.  There is significantly diminished flow in the left renal artery. The right renal artery is patent. IMA: The IMA has been previously ligated and not visualized. Inflow: The bypass graft and the external iliac arteries are patent. There is moderate atherosclerotic calcification of the internal iliac arteries. The internal iliac arteries also remain patent. Veins: The IVC is grossly unremarkable.  No portal venous gas. Review of the MIP images confirms the above findings. NON-VASCULAR No intra-abdominal free air or free fluid. Hepatobiliary: The liver is unremarkable. No biliary dilatation. Cholecystectomy. Dilatation of the common bile duct, post cholecystectomy. No retained calcified stone noted in the central CBD. Pancreas: Unremarkable. No pancreatic ductal dilatation or surrounding inflammatory changes. Spleen: Several small splenic cysts. Indeterminate 2 cm hypoenhancing lesion in the medial spleen which appears larger compared to prior CT. This is not evaluated on this exam. Further evaluation with ultrasound on a nonemergent basis recommended. Adrenals/Urinary Tract: The adrenal glands are unremarkable. Atrophic left kidney. Punctate nonobstructing left renal interpolar calculus. There is significant decreased perfusion to the left kidney. There is no hydronephrosis on the right. Several small right renal cyst. The visualized ureters and urinary bladder appear unremarkable. Stomach/Bowel: Small hiatal hernia. Sigmoid diverticulosis and scattered colonic diverticula without active inflammatory changes. There is no bowel obstruction or active inflammation. The appendix is normal. Lymphatic: No adenopathy. Reproductive: The uterus is grossly unremarkable. No adnexal masses. Other: Small fat containing left inguinal hernia. Status post prior ventral hernia repair. Musculoskeletal: Osteopenia with degenerative changes of the spine. No acute osseous pathology. Review of the MIP images  confirms the above findings.  IMPRESSION: 1. No acute intrathoracic, abdominal, or pelvic pathology. No aortic dissection. 2. 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 Recommend annual imaging followup by CTA or MRA. This recommendation follows 2010 ACCF/AHA/AATS/ACR/ASA/SCA/SCAI/SIR/STS/SVM Guidelines for the Diagnosis and Management of Patients with Thoracic Aortic Disease. Circulation. 2010; 121: W098-J191. Aortic aneurysm NOS (ICD10-I71.9) 3. 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 4. Atrophic left kidney with significantly diminished perfusion. 5. Colonic diverticulosis. No bowel obstruction. Normal appendix. 6. Indeterminate 2 cm hypoenhancing lesion in the medial spleen which appears larger compared to prior CT. 7.  Aortic Atherosclerosis (ICD10-I70.0). Electronically Signed   By: Elgie Collard M.D.   On: 02/04/2023 23:04   CT ANGIO HEAD NECK W WO CM  Result Date: 02/04/2023 CLINICAL DATA:  Slurred speech EXAM: CT ANGIOGRAPHY HEAD AND NECK WITH AND WITHOUT CONTRAST TECHNIQUE: Multidetector CT imaging of the head and neck was performed using the standard protocol during bolus administration of intravenous contrast. Multiplanar CT image reconstructions and MIPs were obtained to evaluate the vascular anatomy. Carotid stenosis measurements (when applicable) are obtained utilizing NASCET criteria, using the distal internal carotid diameter as the denominator. RADIATION DOSE REDUCTION: This exam was performed according to the departmental dose-optimization program which includes automated exposure control, adjustment of the mA and/or kV according to patient size and/or use of iterative reconstruction technique. CONTRAST:  80mL OMNIPAQUE IOHEXOL 350 MG/ML SOLN COMPARISON:  None Available. FINDINGS: CT HEAD FINDINGS Brain: There is no mass, hemorrhage or extra-axial collection. The size and configuration of the ventricles and extra-axial CSF spaces are normal. There is no acute or chronic infarction. There is  hypoattenuation of the periventricular white matter, most commonly indicating chronic ischemic microangiopathy. Skull: The visualized skull base, calvarium and extracranial soft tissues are normal. Sinuses/Orbits: No fluid levels or advanced mucosal thickening of the visualized paranasal sinuses. No mastoid or middle ear effusion. The orbits are normal. CTA NECK FINDINGS SKELETON: There is no bony spinal canal stenosis. No lytic or blastic lesion. OTHER NECK: Normal pharynx, larynx and major salivary glands. No cervical lymphadenopathy. Unremarkable thyroid gland. UPPER CHEST: No pneumothorax or pleural effusion. No nodules or masses. AORTIC ARCH: There is calcific atherosclerosis of the aortic arch. Conventional 3 vessel aortic branching pattern. RIGHT CAROTID SYSTEM: No dissection, occlusion or aneurysm. There is calcified atherosclerosis extending into the proximal ICA, resulting in 50% stenosis. LEFT CAROTID SYSTEM: No dissection, occlusion or aneurysm. There is calcified atherosclerosis extending into the proximal ICA, resulting in less than 50% stenosis. VERTEBRAL ARTERIES: Left dominant configuration.There is no dissection, occlusion or flow-limiting stenosis to the skull base (V1-V3 segments). CTA HEAD FINDINGS POSTERIOR CIRCULATION: --Vertebral arteries: Normal V4 segments. --Inferior cerebellar arteries: Normal. --Basilar artery: Normal. --Superior cerebellar arteries: Normal. --Posterior cerebral arteries (PCA): Normal. ANTERIOR CIRCULATION: --Intracranial internal carotid arteries: Normal. --Anterior cerebral arteries (ACA): Normal. Both A1 segments are present. Patent anterior communicating artery (a-comm). --Middle cerebral arteries (MCA): Normal. VENOUS SINUSES: As permitted by contrast timing, patent. ANATOMIC VARIANTS: Fetal origin of the right posterior cerebral artery. Review of the MIP images confirms the above findings. IMPRESSION: 1. No emergent large vessel occlusion or high-grade stenosis of  the intracranial arteries. 2. Bilateral carotid bifurcation atherosclerosis resulting in 50% stenosis of the proximal right internal carotid artery and slightly less than 50% stenosis of the proximal left internal carotid artery. 3. Chronic ischemic microangiopathy. Aortic Atherosclerosis (ICD10-I70.0). Electronically Signed   By: Deatra Robinson M.D.   On: 02/04/2023 22:56   DG Chest Portable  1 View  Result Date: 02/04/2023 CLINICAL DATA:  Code STEMI, chest pain EXAM: PORTABLE CHEST 1 VIEW COMPARISON:  Radiographs 11/22/2022 FINDINGS: Sternotomy and CABG. Defibrillator pads overlie the left chest. Stable cardiomediastinal silhouette. No focal consolidation, pleural effusion, or pneumothorax. No displaced rib fractures. IMPRESSION: No active disease. Electronically Signed   By: Minerva Fester M.D.   On: 02/04/2023 22:05   _______________________________________________________________________________________________________ Latest  Blood pressure (!) 172/96, pulse 60, temperature 97.9 F (36.6 C), temperature source Oral, resp. rate 18, height 5\' 4"  (1.626 m), weight 79.8 kg, SpO2 100 %.   Vitals  labs and radiology finding personally reviewed  Review of Systems:    Pertinent positives include:   fatigue,, nausea, vomiting, diarrhea,  slurred speech,  confusion Constitutional:  No weight loss, night sweats, Fevers, chills, weight loss  HEENT:  No headaches, Difficulty swallowing,Tooth/dental problems,Sore throat,  No sneezing, itching, ear ache, nasal congestion, post nasal drip,  Cardio-vascular:  No chest pain, Orthopnea, PND, anasarca, dizziness, palpitations.no Bilateral lower extremity swelling  GI:  No heartburn, indigestion, abdominal pain change in bowel habits, loss of appetite, melena, blood in stool, hematemesis Resp:  no shortness of breath at rest. No dyspnea on exertion, No excess mucus, no productive cough, No non-productive cough, No coughing up of blood.No change in color of  mucus.No wheezing. Skin:  no rash or lesions. No jaundice GU:  no dysuria, change in color of urine, no urgency or frequency. No straining to urinate.  No flank pain.  Musculoskeletal:  No joint pain or no joint swelling. No decreased range of motion. No back pain.  Psych:  No change in mood or affect. No depression or anxiety. No memory loss.  Neuro: no localizing neurological complaints, no tingling, no weakness, no double vision, no gait abnormality, no  All systems reviewed and apart from HOPI all are negative _______________________________________________________________________________________________ Past Medical History:   Past Medical History:  Diagnosis Date   Abdominal aortic aneurysm (HCC)    Anemia, improved 01/11/2013   Basal cell carcinoma    CAD (coronary artery disease)    hx CABG 1998, last cath 2007 with PCI and stenting to the proximal segment of the vein graft to the diagonal branch. DES Taxus was placed   Cervicalgia    Chronic fatigue fibromyalgia syndrome    Chronic pain syndrome    Depression    Fibromyalgia    History of nuclear stress test 08/07/2011   lexiscan; no evidence of ischemia or infarct; low risk    Hyperlipidemia    Hypertension    OSA (obstructive sleep apnea)    PAD (peripheral artery disease) (HCC)    Pes anserine bursitis    Pulmonary embolism (HCC) 01/11/2013   S/P resection of aortic aneurysm    Trochanteric bursitis    Ulcerative colitis [facility])       Past Surgical History:  Procedure Laterality Date   AORTOBIEXTERNAL ILIAC BYPASS  1991   BIOPSY  11/25/2022   Procedure: BIOPSY;  Surgeon: Jeani Hawking, MD;  Location: [facility] ENDOSCOPY;  Service: Gastroenterology;;   CATARACT EXTRACTION Left 09/2019   CHOLECYSTECTOMY  1997   COLONOSCOPY WITH PROPOFOL N/A 11/25/2022   Procedure: COLONOSCOPY WITH PROPOFOL;  Surgeon: Jeani Hawking, MD;  Location: [facility] ENDOSCOPY;  Service: Gastroenterology;  Laterality: N/A;   CORONARY ANGIOPLASTY WITH  STENT PLACEMENT  2006   to LAD, ATRETIC LIMA AT THAT TIME ALSO   CORONARY ANGIOPLASTY WITH STENT PLACEMENT  2007   STENT TO PROX VG-DIAG, OTHER STENTS PATENT  CORONARY ARTERY BYPASS GRAFT  1998   LIMA-LAD;VG-DIAG & RCA   CORONARY STENT PLACEMENT  2005   TO LAD & LCX-STENTS,   ESOPHAGOGASTRODUODENOSCOPY (EGD) WITH PROPOFOL N/A 11/25/2022   Procedure: ESOPHAGOGASTRODUODENOSCOPY (EGD) WITH PROPOFOL;  Surgeon: Jeani Hawking, MD;  Location: [facility] ENDOSCOPY;  Service: Gastroenterology;  Laterality: N/A;   HERNIA REPAIR     TUBAL LIGATION     BTL    Social History:  Ambulatory   independently      reports that she quit smoking about 32 years ago. Her smoking use included cigarettes. She has a 50.00 pack-year smoking history. She has never been exposed to tobacco smoke. She has never used smokeless tobacco. She reports that she does not drink alcohol and does not use drugs.     Family History:   Family History  Problem Relation Age of Onset   Heart disease Father    Heart attack Father        @ 7   Cancer Paternal Aunt        BREAST   Heart failure Mother        at 11   Healthy Sister    Cancer Brother        pancreatic    Heart failure Maternal Grandmother    Heart attack Maternal Grandfather    Heart attack Paternal Grandmother        at 30   Healthy Daughter    Healthy Daughter    ______________________________________________________________________________________________ Allergies: Allergies  Allergen Reactions   Biotin Hives   Lisinopril Cough   Atenolol Other (See Comments)    colitis Other reaction(s): Bowel issues   Duloxetine Hcl Other (See Comments)    Mad pt feel spacey Other reaction(s): shaky; tired in the PMs   Lyrica [Pregabalin] Other (See Comments)    Made pt feel spacey   Penicillin G Rash    Other reaction(s): rash   Penicillins Rash   Sulfamethoxazole-Trimethoprim Nausea And Vomiting    Other reaction(s): elevated liver functions     Prior  to Admission medications   Medication Sig Start Date End Date Taking? Authorizing Provider  amLODipine (NORVASC) 5 MG tablet TAKE ONE TABLET BY MOUTH ONCE daily 07/21/22   Hilty, Lisette Abu, MD  apixaban (ELIQUIS) 2.5 MG TABS tablet Take 1 tablet (2.5 mg total) by mouth 2 (two) times daily. 11/28/22   Willeen Niece, MD  atorvastatin (LIPITOR) 20 MG tablet Take 1 tablet (20 mg total) by mouth daily. 11/28/22   Willeen Niece, MD  b complex vitamins capsule Take 1 capsule by mouth daily.    [provider]  betamethasone dipropionate 0.05 % cream Apply 1 Application topically daily.    [provider]  citalopram (CELEXA) 20 MG tablet Take 1 tablet by mouth daily.    [provider]  clobetasol ointment (TEMOVATE) 0.05 % Apply 1 Application topically 2 (two) times daily.    [provider]  ezetimibe (ZETIA) 10 MG tablet TAKE ONE TABLET BY MOUTH ONCE daily 07/21/22   Hilty, Lisette Abu, MD  furosemide (LASIX) 40 MG tablet Take 1 tablet (40 mg total) by mouth 3 (three) times a week. Increase to every day as needed for weight gain of 2lbs overnight or 5lbs in 1 week 09/26/22   Chrystie Nose, MD  HYDROcodone-acetaminophen (NORCO/VICODIN) 5-325 MG tablet Take 1 tablet by mouth 4 (four) times daily as needed for moderate pain. May Take an extra 0.5 to one tablet when pain is severe. 12/11/22  Jones Bales, NP  isosorbide mononitrate (IMDUR) 30 MG 24 hr tablet Take 0.5 tablets (15 mg total) by mouth daily. 12/26/22 03/26/23  Joylene Grapes, NP  methocarbamol (ROBAXIN) 500 MG tablet TAKE 1 TABLET BY MOUTH EVERY 8 HOURS AS NEEDED FOR MUSCLE SPASM Patient taking differently: Take 500 mg by mouth every 8 (eight) hours as needed for muscle spasms. 10/23/22   Jones Bales, NP  metoprolol succinate (TOPROL-XL) 50 MG 24 hr tablet TAKE ONE TABLET BY MOUTH ONCE daily Patient taking differently: Take 50 mg by mouth daily. 10/23/22   Hilty, Lisette Abu, MD  Multiple Vitamin  (MULITIVITAMIN WITH MINERALS) TABS Take 1 tablet by mouth daily.    [provider]  potassium chloride (KLOR-CON M) 10 MEQ tablet Take 1 tablet (10 mEq total) by mouth 3 (three) times a week. And with extra lasix as needed. 09/26/22   Hilty, Lisette Abu, MD  zolpidem (AMBIEN CR) 12.5 MG CR tablet Take 12.5 mg by mouth at bedtime as needed. 09/11/21   [provider]    ___________________________________________________________________________________________________ Physical Exam:    02/04/2023    9:01 PM 01/23/2023    2:27 PM 12/26/2022    1:47 PM  Vitals with BMI  Height 5\' 4"  5\' 4"  5\' 4"   Weight 176 lbs 185 lbs 13 oz 184 lbs  BMI 30.2 31.88 31.57  Systolic 172 138 295  Diastolic 96 70 62  Pulse 60 85 96     1. General:  in No  Acute distress    Chronically ill  -appearing 2. Psychological: Alert and   Oriented to self 3. Head/ENT:     Dry Mucous Membranes                          Head Non traumatic, neck supple                          Poor Dentition 4. SKIN decreased Skin turgor,  Skin clean Dry and intact no rash    5. Heart: Regular rate and rhythm no  Murmur, no Rub or gallop 6. Lungs: , no wheezes or crackles   7. Abdomen: Soft,  non-tender, Non distended   obese  bowel sounds present 8. Lower extremities: no clubbing, cyanosis, no  edema 9. Neurologically strength 5 out of 5 in all 4 extremities cranial nerves II through XII intact 10. MSK: Normal range of motion    Chart has been reviewed  ______________________________________________________________________________________________  Assessment/Plan 82 y.o. 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   Admitted for Hypokalemia, Dehydration,   Hypomagnesemia, Dehydration and acute encephalopathy    Present on Admission:  Acute encephalopathy  Chest wall pain  PAD (peripheral artery disease), aortic-iliac bypass 1991,  mild carotid bruits  OSA (obstructive sleep apnea)  Chronic fatigue syndrome with fibromyalgia  Ulcerative colitis (HCC)  Atrial fibrillation (HCC)  Accelerated hypertension  Elevated troponin  Hypokalemia  Hypomagnesemia  CKD (chronic kidney disease) stage 3, GFR 30-59 ml/min (HCC)  Intractable nausea and vomiting  Prolonged QT interval  Metabolic alkalosis     Acute encephalopathy   - most likely multifactorial secondary to combination of dehydration secondary to decreased by mouth intake,    - Will rehydrate   - no evidence of  underlining infection   - Hold contributing medications   - if no improvement may need  further imaging to evaluate for CNS pathology pathology such as MRI of the brain   - neurological exam appears to be nonfocal but patient unable to cooperate fully   - VBG no evidence of hypercarbia    - no history of liver disease ammonia pending   Chest wall pain Currently appears to be chest pain-free.  Initial EKG appeared to be abnormal continue to obtain serial EKG troponin slightly elevated but stable.  Repeat echogram.  Currently denies any chest pain Check lipid panel  PAD (peripheral artery disease), aortic-iliac bypass 1991, mild carotid bruits Chronic stable History of aortic aneurysm. Maintain blood pressure below 160 if able  OSA (obstructive sleep apnea) Oxygen as needed not on CPAP  Chronic fatigue syndrome with fibromyalgia Somewhat contributing to generalized pain.  Will be cautious to add medications while patient is somewhat altered.  As an outpatient could consider addition of antidepressant and or  Neurontin or equivalents  Ulcerative colitis (HCC) Followed by Dr. Elnoria Howard current appears to be stable  Atrial fibrillation (HCC) Continue Eliquis and metoprolol for able to tolerate  Accelerated hypertension In the setting of increased confusion would obtain MRI to rule out press control blood pressure Try to keep below 160 if  able  Elevated troponin Appears flat.  Continue to monitor repeat echo serial EKG patient currently denies any chest pain  Hypokalemia Severe will replace Check and replace magnesium and phosphate Given potentially poor p.o. intake will administer IV thiamine  Hypomagnesemia Replace and recheck  CKD (chronic kidney disease) stage 3, GFR 30-59 ml/min (HCC)  -chronic avoid nephrotoxic medications such as NSAIDs, Vanco Zosyn combo,  avoid hypotension, continue to follow renal function   Intractable nausea and vomiting This visit has been ongoing for the past few months has been seen by GI Dr. Elnoria Howard Will send a message and patient is being admitted to see if there is any further studies or suggestions they may have Order gastric panel as patient also endorses she has been having some diarrhea as well Ordered Reglan as needed Ativan as needed  hold Zofran  given qtc prolongation    Prolonged QT interval - will monitor on tele avoid QT prolonging medications, rehydrate correct electrolytes   Metabolic alkalosis In the setting of severe nausea and vomiting will rehydrate and continue to follow    Other plan as per orders.  DVT prophylaxis:  SCD    Code Status:   DNR/DNI   as per patient  family  I had personally discussed CODE STATUS with patient and family  ACP none   Family Communication:   Family  at  Bedside  plan of care was discussed  with   Daughters,    Diet    Disposition Plan:       To home once workup is complete and patient is stable   Following barriers for discharge:                            Electrolytes corrected                               Pain controlled with PO medications  Will need consultants to evaluate patient prior to discharge        Consult Orders  (From admission, onward)           Start     Ordered   02/04/23 2327  Consult to hospitalist  Once       Provider:  (Not yet  assigned)  Question Answer Comment  Place call to: [facility]   Reason for Consult Admit      02/04/23 2326                               Would benefit from PT/OT eval prior to DC  Ordered                      Consults called: sent msg to Dr. Loreta Ave w IG   Admission status:  ED Disposition     ED Disposition  Admit   Condition  --   Comment  Hospital Area: [facility] [[id number]]  Level of Care: Stepdown [14]  Admit to SDU based on following criteria: Severe physiological/psychological symptoms:  Any diagnosis requiring assessment & intervention at least every 4 hours on an ongoing basis to obtain desired patient outcomes including stability and rehabilitation  May place patient in observation at [facility] or [facility] if equivalent level of care is available:: No  Covid Evaluation: Asymptomatic - no recent exposure (last 10 days) testing not required  Diagnosis: Acute encephalopathy [[id number]]  Admitting Physician: Therisa Doyne [[id number]]  Attending Physician: Therisa Doyne [[id number]]           Obs    Level of care   stepdown tele indefinitely please discontinue once patient no longer qualifies COVID-19 Labs     Pahoua Schreiner 02/05/2023, 1:11 AM    [facility]     after 2 AM please page floor coverage PA If 7AM-7PM, please contact the day team taking care of the patient using [email or web address]

## 2023-02-05 NOTE — Evaluation (Signed)
Clinical/Bedside Swallow Evaluation Patient Details  Name: Kara Bush MRN: [id number] Date of Birth: 22-Aug-1940  Today's Date: 02/05/2023 Time: SLP Start Time (ACUTE ONLY): 1530 SLP Stop Time (ACUTE ONLY): 1550 SLP Time Calculation (min) (ACUTE ONLY): 20 min  Past Medical History:  Past Medical History:  Diagnosis Date   Abdominal aortic aneurysm (HCC)    Anemia, improved 01/11/2013   Basal cell carcinoma    CAD (coronary artery disease)    hx CABG 1998, last cath 2007 with PCI and stenting to the proximal segment of the vein graft to the diagonal branch. DES Taxus was placed   Cervicalgia    Chronic fatigue fibromyalgia syndrome    Chronic pain syndrome    Depression    Fibromyalgia    History of nuclear stress test 08/07/2011   lexiscan; no evidence of ischemia or infarct; low risk    Hyperlipidemia    Hypertension    OSA (obstructive sleep apnea)    PAD (peripheral artery disease) (HCC)    Pes anserine bursitis    Pulmonary embolism (HCC) 01/11/2013   S/P resection of aortic aneurysm    Trochanteric bursitis    Ulcerative colitis [facility])    Past Surgical History:  Past Surgical History:  Procedure Laterality Date   AORTOBIEXTERNAL ILIAC BYPASS  1991   BIOPSY  11/25/2022   Procedure: BIOPSY;  Surgeon: Jeani Hawking, MD;  Location: [facility] ENDOSCOPY;  Service: Gastroenterology;;   CATARACT EXTRACTION Left 09/2019   CHOLECYSTECTOMY  1997   COLONOSCOPY WITH PROPOFOL N/A 11/25/2022   Procedure: COLONOSCOPY WITH PROPOFOL;  Surgeon: Jeani Hawking, MD;  Location: [facility] ENDOSCOPY;  Service: Gastroenterology;  Laterality: N/A;   CORONARY ANGIOPLASTY WITH STENT PLACEMENT  2006   to LAD, ATRETIC LIMA AT THAT TIME ALSO   CORONARY ANGIOPLASTY WITH STENT PLACEMENT  2007   STENT TO PROX VG-DIAG, OTHER STENTS PATENT   CORONARY ARTERY BYPASS GRAFT  1998   LIMA-LAD;VG-DIAG & RCA   CORONARY STENT PLACEMENT  2005   TO LAD & LCX-STENTS,   ESOPHAGOGASTRODUODENOSCOPY (EGD) WITH PROPOFOL N/A  11/25/2022   Procedure: ESOPHAGOGASTRODUODENOSCOPY (EGD) WITH PROPOFOL;  Surgeon: Jeani Hawking, MD;  Location: [facility] ENDOSCOPY;  Service: Gastroenterology;  Laterality: N/A;   HERNIA REPAIR     TUBAL LIGATION     BTL   HPI:  Kara Bush is an 82 yo female adm to [facility] with h/o anemia, ulcerative colitis- adm with nausea and weakness.  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  Swallow and speech eval ordered.  Pt denies having issues with dysphagia concerning mouth and throat - advises that she has problems with nausea and she is under GI care.    Assessment / Plan / Recommendation  Clinical Impression  Normal oropharyneal swallow function present based on clinical evaluation.  SLP did not conduct 3 ounce Yale water challenge due to pt having issues with nausea just PTA *and having large hiatal hernia.  CN exam was unremarkable and pt's voice is normal per family.  Slight brownish coating on lingual surface present, continued after dental and lingual brushing.  Pt denies discomfort however.  Swallow was timely without indication of retention or deficits. Daughter questions if pt to have procedure tomorrow, thus SLP only provided pt with few bites/sips - defer diet  to MD.  Will follow up for cognitive linguistic evaluation per orders.  Pt's grandson lives with her and her daughter reports pt managing home duties. SLP Visit Diagnosis: Dysphagia, unspecified (R13.10)    Aspiration Risk  No limitations    Diet Recommendation Other (Comment) (defer to md)    Liquid Administration via: Cup;Straw Medication Administration: Whole meds with liquid Supervision: Patient able to self feed Compensations: Slow rate;Small sips/bites Postural Changes: Seated upright at 90  degrees;Remain upright for at least 30 minutes after po intake    Other  Recommendations Oral Care Recommendations: Oral care BID    Recommendations for follow up therapy are one component of a multi-disciplinary discharge planning process, led by the attending physician.  Recommendations may be updated based on patient status, additional functional criteria and insurance authorization.  Follow up Recommendations Follow physician's recommendations for discharge plan and follow up therapies      Assistance Recommended at Discharge  N/a  Functional Status Assessment Patient has not had a recent decline in their functional status  Frequency and Duration     N/a       Prognosis   N/a     Swallow Study   General Date of Onset: 02/05/23 HPI: Kara Bush is an 82 yo female adm to [facility] with h/o anemia, ulcerative colitis- adm with nausea and weakness.  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  Swallow and speech eval ordered.  Pt denies having issues with dysphagia concerning mouth and throat - advises that she has problems with nausea and she is under GI care. Type of Study: Bedside Swallow Evaluation Previous Swallow Assessment: UGI 2024/4 Diet Prior to this Study: NPO Temperature Spikes Noted: No Respiratory Status: Room air History of Recent Intubation: No Behavior/Cognition: Alert;Cooperative;Other (Comment) (delayed responses) Oral Cavity Assessment: Within Functional Limits Oral Care Completed by SLP: No Oral Cavity - Dentition: Adequate natural dentition Vision: Functional for self-feeding Self-Feeding Abilities: Able to feed self Patient Positioning: Upright in bed Baseline Vocal Quality: Normal Volitional Cough: Strong Volitional Swallow: Able to  elicit    Oral/Motor/Sensory Function Overall Oral Motor/Sensory Function: Within functional limits   Ice Chips Ice chips: Not tested   Thin Liquid Thin Liquid: Within functional limits Presentation: Self Fed;Straw    Nectar Thick Nectar Thick Liquid: Not tested   Honey Thick Honey Thick Liquid: Not tested   Puree Puree: Within functional limits Presentation: Self Fed;Spoon   Solid     Solid: Within functional limits Presentation: Self Orvan July 02/05/2023,4:25 PM Rolena Infante, MS [facility] SLP Acute Rehab Services Office [phone]'

## 2023-02-05 NOTE — Assessment & Plan Note (Signed)
Followed by Dr. Elnoria Howard current appears to be stable

## 2023-02-05 NOTE — Progress Notes (Signed)
  Echocardiogram 2D Echocardiogram has been performed.  Kara Bush 02/05/2023, 2:27 PM

## 2023-02-05 NOTE — Assessment & Plan Note (Signed)
In the setting of severe nausea and vomiting will rehydrate and continue to follow

## 2023-02-05 NOTE — Assessment & Plan Note (Signed)
Currently appears to be chest pain-free.  Initial EKG appeared to be abnormal continue to obtain serial EKG troponin slightly elevated but stable.  Repeat echogram.  Currently denies any chest pain Check lipid panel

## 2023-02-05 NOTE — Progress Notes (Signed)
OT Cancellation Note  Patient Details Name: Kara Bush MRN: [id number] DOB: [birth year]   Cancelled Treatment:    Reason Eval/Treat Not Completed: Patient not medically ready: Dr. Elvera Lennox in agreement to hold PT/OT today due to critical K+ level.   Theodoro Clock 02/05/2023, 8:43 AM

## 2023-02-05 NOTE — Assessment & Plan Note (Signed)
Oxygen as needed not on CPAP

## 2023-02-05 NOTE — Subjective & Objective (Signed)
Pt had recent admit in April for anemia, has Ulcertive colitis that is stable Has not been feeling well, nauseaous and vomiting  Reports epigastric pain, CP  Confused not acting like self  Initial ECG had changes and code stemi was called  Cardiology canceled STEMI trop 45 No UTI

## 2023-02-05 NOTE — Assessment & Plan Note (Signed)
Chronic stable History of aortic aneurysm. Maintain blood pressure below 160 if able

## 2023-02-05 NOTE — Assessment & Plan Note (Signed)
-  chronic avoid nephrotoxic medications such as NSAIDs, Vanco Zosyn combo,  avoid hypotension, continue to follow renal function  

## 2023-02-05 NOTE — Assessment & Plan Note (Signed)
Somewhat contributing to generalized pain.  Will be cautious to add medications while patient is somewhat altered.  As an outpatient could consider addition of antidepressant and or  Neurontin or equivalents

## 2023-02-05 NOTE — Assessment & Plan Note (Addendum)
This visit has been ongoing for the past few months has been seen by GI Dr. Elnoria Howard Will send a message and patient is being admitted to see if there is any further studies or suggestions they may have Order gastric panel as patient also endorses she has been having some diarrhea as well Ordered Reglan as needed Ativan as needed  hold Zofran  given qtc prolongation

## 2023-02-05 NOTE — Assessment & Plan Note (Signed)
In the setting of increased confusion would obtain MRI to rule out press control blood pressure Try to keep below 160 if able

## 2023-02-05 NOTE — Assessment & Plan Note (Signed)
Replace and recheck 

## 2023-02-05 NOTE — Assessment & Plan Note (Signed)
Appears flat.  Continue to monitor repeat echo serial EKG patient currently denies any chest pain

## 2023-02-05 NOTE — ED Notes (Signed)
Admitting provider bedside 

## 2023-02-05 NOTE — ED Notes (Signed)
ED TO INPATIENT HANDOFF REPORT  S Name/Age/Gender Kara Bush 82 y.o. female Room/Bed: RESB/RESB  Code Status   Code Status: DNR  Home/SNF/Other Home Patient oriented to: self, place, time, and situation Is this baseline? Yes   Triage Complete: Triage complete  Chief Complaint Acute encephalopathy [G93.40]  Triage Note D/c from [facility] the end of April and had blood transfusions. Pt has been having nausea since d/c, but reports it is worse tonight. Pt daughter states that pt is disoriented and states she has been saying the wrong words and slurred speech for the last couple of days. Increase in fatigue over the last few days. A&Ox4 in triage.    Allergies Allergies  Allergen Reactions   Biotin Hives   Lisinopril Cough   Atenolol Other (See Comments)    colitis Other reaction(s): Bowel issues   Duloxetine Hcl Other (See Comments)    Mad pt feel spacey Other reaction(s): shaky; tired in the PMs   Lyrica [Pregabalin] Other (See Comments)    Made pt feel spacey   Penicillin G Rash    Other reaction(s): rash   Penicillins Rash   Sulfamethoxazole-Trimethoprim Nausea And Vomiting    Other reaction(s): elevated liver functions    Level of Care/Admitting Diagnosis ED Disposition     ED Disposition  Admit   Condition  --   Comment  Hospital Area: [facility] [[id number]]  Level of Care: Stepdown [14]  Admit to SDU based on following criteria: Severe physiological/psychological symptoms:  Any diagnosis requiring assessment & intervention at least every 4 hours on an ongoing basis to obtain desired patient outcomes including stability and rehabilitation  May place patient in observation at [facility] or [facility] if equivalent level of care is available:: No  Covid Evaluation: Asymptomatic - no recent exposure (last 10 days) testing not required  Diagnosis: Acute encephalopathy [161096]  Admitting Physician: Therisa Doyne [[id number]]   Attending Physician: Therisa Doyne [[id number]]          B Medical/Surgery History Past Medical History:  Diagnosis Date   Abdominal aortic aneurysm (HCC)    Anemia, improved 01/11/2013   Basal cell carcinoma    CAD (coronary artery disease)    hx CABG 1998, last cath 2007 with PCI and stenting to the proximal segment of the vein graft to the diagonal branch. DES Taxus was placed   Cervicalgia    Chronic fatigue fibromyalgia syndrome    Chronic pain syndrome    Depression    Fibromyalgia    History of nuclear stress test 08/07/2011   lexiscan; no evidence of ischemia or infarct; low risk    Hyperlipidemia    Hypertension    OSA (obstructive sleep apnea)    PAD (peripheral artery disease) (HCC)    Pes anserine bursitis    Pulmonary embolism (HCC) 01/11/2013   S/P resection of aortic aneurysm    Trochanteric bursitis    Ulcerative colitis [facility])    Past Surgical History:  Procedure Laterality Date   AORTOBIEXTERNAL ILIAC BYPASS  1991   BIOPSY  11/25/2022   Procedure: BIOPSY;  Surgeon: Jeani Hawking, MD;  Location: [facility] ENDOSCOPY;  Service: Gastroenterology;;   CATARACT EXTRACTION Left 09/2019   CHOLECYSTECTOMY  1997   COLONOSCOPY WITH PROPOFOL N/A 11/25/2022   Procedure: COLONOSCOPY WITH PROPOFOL;  Surgeon: Jeani Hawking, MD;  Location: [facility] ENDOSCOPY;  Service: Gastroenterology;  Laterality: N/A;   CORONARY ANGIOPLASTY WITH STENT PLACEMENT  2006   to LAD, ATRETIC LIMA AT THAT TIME  ALSO   CORONARY ANGIOPLASTY WITH STENT PLACEMENT  2007   STENT TO PROX VG-DIAG, OTHER STENTS PATENT   CORONARY ARTERY BYPASS GRAFT  1998   LIMA-LAD;VG-DIAG & RCA   CORONARY STENT PLACEMENT  2005   TO LAD & LCX-STENTS,   ESOPHAGOGASTRODUODENOSCOPY (EGD) WITH PROPOFOL N/A 11/25/2022   Procedure: ESOPHAGOGASTRODUODENOSCOPY (EGD) WITH PROPOFOL;  Surgeon: Jeani Hawking, MD;  Location: [facility] ENDOSCOPY;  Service: Gastroenterology;  Laterality: N/A;   HERNIA REPAIR     TUBAL LIGATION     BTL     A IV  Location/Drains/Wounds Patient Lines/Drains/Airways Status     Active Line/Drains/Airways     Name Placement date Placement time Site Days   Peripheral IV 02/04/23 20 G Right Antecubital 02/04/23  2155  Antecubital  1   Peripheral IV 02/04/23 20 G Anterior;Left;Proximal Forearm 02/04/23  2157  Forearm  1            Intake/Output Last 24 hours No intake or output data in the 24 hours ending 02/05/23 1610  Labs/Imaging Results for orders placed or performed during the hospital encounter of 02/04/23 (from the past 48 hour(s))  Basic metabolic panel     Status: Abnormal   Collection Time: 02/04/23  9:15 PM  Result Value Ref Range   Sodium 133 (L) 135 - 145 mmol/L   Potassium 2.1 (LL) 3.5 - 5.1 mmol/L    Comment: CRITICAL RESULT CALLED TO, READ BACK BY AND VERIFIED WITH WINGFIELD,B RN @ 2150 BY COLE,J 02/04/23   Chloride 83 (L) 98 - 111 mmol/L   CO2 33 (H) 22 - 32 mmol/L   Glucose, Bld 141 (H) 70 - 99 mg/dL    Comment: Glucose reference range applies only to samples taken after fasting for at least 8 hours.   BUN 19 8 - 23 mg/dL   Creatinine, Ser 9.60 (H) 0.44 - 1.00 mg/dL   Calcium 9.7 8.9 - 45.4 mg/dL   GFR, Estimated 39 (L) >60 mL/min    Comment: (NOTE) Calculated using the CKD-EPI Creatinine Equation (2021)    Anion gap 17 (H) 5 - 15    Comment: Performed at [facility], [address], [place], [place] [address]  CBC     Status: Abnormal   Collection Time: 02/04/23  9:15 PM  Result Value Ref Range   WBC 13.5 (H) 4.0 - 10.5 K/uL   RBC 6.70 (H) 3.87 - 5.11 MIL/uL   Hemoglobin 14.1 12.0 - 15.0 g/dL   HCT 91.4 (H) 78.2 - 95.6 %   MCV 70.4 (L) 80.0 - 100.0 fL   MCH 21.0 (L) 26.0 - 34.0 pg   MCHC 29.9 (L) 30.0 - 36.0 g/dL   RDW 21.3 (H) 08.6 - 57.8 %   Platelets 304 150 - 400 K/uL   nRBC 0.0 0.0 - 0.2 %    Comment: Performed at [facility], [address], [place], [place] [address]  Troponin I (High Sensitivity)     Status:  Abnormal   Collection Time: 02/04/23  9:25 PM  Result Value Ref Range   Troponin I (High Sensitivity) 45 (H) <18 ng/L    Comment: (NOTE) Elevated high sensitivity troponin I (hsTnI) values and significant  changes across serial measurements may suggest ACS but many other  chronic and acute conditions are known to elevate hsTnI results.  Refer to the "Links" section for chest pain algorithms and additional  guidance. Performed at [facility], [address] [place], [place]  [address]   Lipase, blood     Status: Abnormal   Collection Time: 02/04/23  9:25 PM  Result Value Ref Range   Lipase 59 (H) 11 - 51 U/L    Comment: Performed at [facility], [address] [place], [place] [address]  Magnesium     Status: Abnormal   Collection Time: 02/04/23  9:25 PM  Result Value Ref Range   Magnesium 1.5 (L) 1.7 - 2.4 mg/dL    Comment: Performed at [facility], [address] [place], [place] [address]  CBG monitoring, ED     Status: Abnormal   Collection Time: 02/04/23  9:51 PM  Result Value Ref Range   Glucose-Capillary 166 (H) 70 - 99 mg/dL    Comment: Glucose reference range applies only to samples taken after fasting for at least 8 hours.  Blood gas, venous (at [facility])     Status: Abnormal   Collection Time: 02/04/23  9:59 PM  Result Value Ref Range   pH, Ven 7.66 (HH) 7.25 - 7.43    Comment: CRITICAL RESULT CALLED TO, READ BACK BY AND VERIFIED WITH: WOODY,A RN @ 2248 02/04/23 BY CHILDRESS,E    pCO2, Ven 40 (L) 44 - 60 mmHg   pO2, Ven 52 (H) 32 - 45 mmHg   Bicarbonate 45.1 (H) 20.0 - 28.0 mmol/L   Acid-Base Excess 22.1 (H) 0.0 - 2.0 mmol/L   O2 Saturation 90.7 %   Patient temperature 37.0     Comment: Performed at [facility], [address], [place], [place] [address]  Hepatic function panel     Status: Abnormal   Collection Time: 02/04/23 10:01 PM  Result Value Ref Range   Total Protein 8.3  (H) 6.5 - 8.1 g/dL   Albumin 4.3 3.5 - 5.0 g/dL   AST 28 15 - 41 U/L   ALT 18 0 - 44 U/L   Alkaline Phosphatase 56 38 - 126 U/L   Total Bilirubin 0.8 0.3 - 1.2 mg/dL   Bilirubin, Direct <8.6 0.0 - 0.2 mg/dL   Indirect Bilirubin NOT CALCULATED 0.3 - 0.9 mg/dL    Comment: Performed at [facility], [address], [place], [place] [address]  Lactic acid, plasma     Status: Abnormal   Collection Time: 02/04/23 10:02 PM  Result Value Ref Range   Lactic Acid, Venous 3.2 (HH) 0.5 - 1.9 mmol/L    Comment: CRITICAL RESULT CALLED TO, READ BACK BY AND VERIFIED WITH WINGFIELD,B RN @ 2234 02/04/23 BY CHILDRESS,E Performed at [facility], [address] [place], [place] [address]   Troponin I (High Sensitivity)     Status: Abnormal   Collection Time: 02/04/23 11:27 PM  Result Value Ref Range   Troponin I (High Sensitivity) 44 (H) <18 ng/L    Comment: (NOTE) Elevated high sensitivity troponin I (hsTnI) values and significant  changes across serial measurements may suggest ACS but many other  chronic and acute conditions are known to elevate hsTnI results.  Refer to the "Links" section for chest pain algorithms and additional  guidance. Performed at [facility], [address], [place], [place] [address]   Urinalysis, Routine w reflex microscopic -Urine, Clean Catch     Status: Abnormal   Collection Time: 02/04/23 11:28 PM  Result Value Ref Range   Color, Urine STRAW (A) YELLOW   APPearance CLEAR CLEAR   Specific Gravity, Urine 1.017 1.005 - 1.030  pH 7.0 5.0 - 8.0   Glucose, UA NEGATIVE NEGATIVE mg/dL   Hgb urine dipstick SMALL (A) NEGATIVE   Bilirubin Urine NEGATIVE NEGATIVE   Ketones, ur NEGATIVE NEGATIVE mg/dL   Protein, ur NEGATIVE NEGATIVE mg/dL   Nitrite NEGATIVE NEGATIVE   Leukocytes,Ua TRACE (A) NEGATIVE   RBC / HPF 0-5 0 - 5 RBC/hpf   WBC, UA 0-5 0 - 5 WBC/hpf   Bacteria, UA RARE (A) NONE SEEN   Squamous Epithelial  / HPF 0-5 0 - 5 /HPF    Comment: Performed at [facility], [address] [place], [place] [address]  SARS Coronavirus 2 by RT PCR (hospital order, performed in [facility] hospital lab) *cepheid single result test* Anterior Nasal Swab     Status: None   Collection Time: 02/05/23  2:44 AM   Specimen: Anterior Nasal Swab  Result Value Ref Range   SARS Coronavirus 2 by RT PCR NEGATIVE NEGATIVE    Comment: (NOTE) SARS-CoV-2 target nucleic acids are NOT DETECTED.  The SARS-CoV-2 RNA is generally detectable in upper and lower respiratory specimens during the acute phase of infection. The lowest concentration of SARS-CoV-2 viral copies this assay can detect is 250 copies / mL. A negative result does not preclude SARS-CoV-2 infection and should not be used as the sole basis for treatment or other patient management decisions.  A negative result may occur with improper specimen collection / handling, submission of specimen other than nasopharyngeal swab, presence of viral mutation(s) within the areas targeted by this assay, and inadequate number of viral copies (<250 copies / mL). A negative result must be combined with clinical observations, patient history, and epidemiological information.  Fact Sheet for Patients:   RoadLapTop.co.za  Fact Sheet for Healthcare Providers: http://kim-miller.com/  This test is not yet approved or  cleared by the [place] FDA and has been authorized for detection and/or diagnosis of SARS-CoV-2 by FDA under an Emergency Use Authorization (EUA).  This EUA will remain in effect (meaning this test can be used) for the duration of the COVID-19 declaration under Section 564(b)(1) of the Act, 21 U.S.C. section 360bbb-3(b)(1), unless the authorization is terminated or revoked sooner.  Performed at [facility], [address], [place], [place] [address]   CBC with  Differential/Platelet     Status: Abnormal   Collection Time: 02/05/23  3:09 AM  Result Value Ref Range   WBC 14.4 (H) 4.0 - 10.5 K/uL   RBC 5.69 (H) 3.87 - 5.11 MIL/uL   Hemoglobin 12.2 12.0 - 15.0 g/dL   HCT 09.8 11.9 - 14.7 %   MCV 70.7 (L) 80.0 - 100.0 fL   MCH 21.4 (L) 26.0 - 34.0 pg   MCHC 30.3 30.0 - 36.0 g/dL   RDW 82.9 (H) 56.2 - 13.0 %   Platelets 268 150 - 400 K/uL   nRBC 0.0 0.0 - 0.2 %   Neutrophils Relative % 84 %   Neutro Abs 11.9 (H) 1.7 - 7.7 K/uL   Lymphocytes Relative 11 %   Lymphs Abs 1.6 0.7 - 4.0 K/uL   Monocytes Relative 5 %   Monocytes Absolute 0.8 0.1 - 1.0 K/uL   Eosinophils Relative 0 %   Eosinophils Absolute 0.0 0.0 - 0.5 K/uL   Basophils Relative 0 %   Basophils Absolute 0.1 0.0 - 0.1 K/uL   Immature Granulocytes 0 %   Abs Immature Granulocytes 0.06 0.00 - 0.07 K/uL    Comment: Performed at [employer] [facility], [address], [place], [place] [address]   CT Angio Chest/Abd/Pel for Dissection W and/or W/WO  Result Date: 02/04/2023 CLINICAL DATA:  Altered mental status. EXAM: CT ANGIOGRAPHY CHEST, ABDOMEN AND PELVIS TECHNIQUE: Non-contrast CT of the chest was initially obtained. Multidetector CT imaging through the chest, abdomen and pelvis was performed using the standard protocol during bolus administration of intravenous contrast. Multiplanar reconstructed images and MIPs were obtained and reviewed to evaluate the vascular anatomy. RADIATION DOSE REDUCTION: This exam was performed according to the departmental dose-optimization program which includes automated exposure control, adjustment of the mA and/or kV according to patient size and/or use of iterative reconstruction technique. CONTRAST:  80mL OMNIPAQUE IOHEXOL 350 MG/ML SOLN COMPARISON:  Chest CT dated 09/10/2020 and CT of the abdomen pelvis dated 05/19/2022. FINDINGS: CTA CHEST FINDINGS Cardiovascular: Borderline cardiomegaly. No pericardial effusion. Three-vessel coronary vascular  calcification and postsurgical changes of CABG. Moderate atherosclerotic calcification of the thoracic aorta. Fusiform aneurysm of the ascending aorta measuring up to 4.2 cm in diameter. This is relatively similar to prior CT. No aortic dissection. The origins of the great vessels of the aortic arch and the central pulmonary arteries appear patent for the degree of opacification. Mediastinum/Nodes: No hilar or mediastinal adenopathy. Small hiatal hernia. The esophagus is grossly unremarkable. No mediastinal fluid collection. Lungs/Pleura: Faint diffuse hazy densities throughout the lungs may represent atelectasis or areas of air trapping related to underlying small airways versus small vessel disease. No focal consolidation, pleural effusion, or pneumothorax. The central airways are patent. Musculoskeletal: Median sternotomy wires. No acute osseous pathology. Review of the MIP images confirms the above findings. CTA ABDOMEN AND PELVIS FINDINGS VASCULAR Aorta: Advanced atherosclerotic calcification of the abdominal aorta. Postsurgical changes of aorto bi iliac bypass. There is advanced atherosclerotic calcification with luminal narrowing of the abdominal aorta at the level of the renal arteries as well as just above the bypass graft. No aortic dissection. No periaortic fluid collection. Celiac: Atherosclerotic calcification of the origin of the celiac axis. The celiac trunk and its major branches remain patent. SMA: Atherosclerotic calcification of the origin of the SMA. The SMA remains patent. Renals: Advanced atherosclerotic calcification of the origin of the left renal artery. There is significantly diminished flow in the left renal artery. The right renal artery is patent. IMA: The IMA has been previously ligated and not visualized. Inflow: The bypass graft and the external iliac arteries are patent. There is moderate atherosclerotic calcification of the internal iliac arteries. The internal iliac arteries also  remain patent. Veins: The IVC is grossly unremarkable.  No portal venous gas. Review of the MIP images confirms the above findings. NON-VASCULAR No intra-abdominal free air or free fluid. Hepatobiliary: The liver is unremarkable. No biliary dilatation. Cholecystectomy. Dilatation of the common bile duct, post cholecystectomy. No retained calcified stone noted in the central CBD. Pancreas: Unremarkable. No pancreatic ductal dilatation or surrounding inflammatory changes. Spleen: Several small splenic cysts. Indeterminate 2 cm hypoenhancing lesion in the medial spleen which appears larger compared to prior CT. This is not evaluated on this exam. Further evaluation with ultrasound on a nonemergent basis recommended. Adrenals/Urinary Tract: The adrenal glands are unremarkable. Atrophic left kidney. Punctate nonobstructing left renal interpolar calculus. There is significant decreased perfusion to the left kidney. There is no hydronephrosis on the right. Several small right renal cyst. The visualized ureters and urinary bladder appear unremarkable. Stomach/Bowel: Small hiatal hernia. Sigmoid diverticulosis and scattered colonic diverticula without active inflammatory changes. There is no bowel obstruction or  active inflammation. The appendix is normal. Lymphatic: No adenopathy. Reproductive: The uterus is grossly unremarkable. No adnexal masses. Other: Small fat containing left inguinal hernia. Status post prior ventral hernia repair. Musculoskeletal: Osteopenia with degenerative changes of the spine. No acute osseous pathology. Review of the MIP images confirms the above findings. IMPRESSION: 1. No acute intrathoracic, abdominal, or pelvic pathology. No aortic dissection. 2. 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 Recommend annual imaging followup by CTA or MRA. This recommendation follows 2010 ACCF/AHA/AATS/ACR/ASA/SCA/SCAI/SIR/STS/SVM Guidelines for the Diagnosis and Management of Patients with  Thoracic Aortic Disease. Circulation. 2010; 121: Z610-R604. Aortic aneurysm NOS (ICD10-I71.9) 3. 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 4. Atrophic left kidney with significantly diminished perfusion. 5. Colonic diverticulosis. No bowel obstruction. Normal appendix. 6. Indeterminate 2 cm hypoenhancing lesion in the medial spleen which appears larger compared to prior CT. 7.  Aortic Atherosclerosis (ICD10-I70.0). Electronically Signed   By: Elgie Collard M.D.   On: 02/04/2023 23:04   CT ANGIO HEAD NECK W WO CM  Result Date: 02/04/2023 CLINICAL DATA:  Slurred speech EXAM: CT ANGIOGRAPHY HEAD AND NECK WITH AND WITHOUT CONTRAST TECHNIQUE: Multidetector CT imaging of the head and neck was performed using the standard protocol during bolus administration of intravenous contrast. Multiplanar CT image reconstructions and MIPs were obtained to evaluate the vascular anatomy. Carotid stenosis measurements (when applicable) are obtained utilizing NASCET criteria, using the distal internal carotid diameter as the denominator. RADIATION DOSE REDUCTION: This exam was performed according to the departmental dose-optimization program which includes automated exposure control, adjustment of the mA and/or kV according to patient size and/or use of iterative reconstruction technique. CONTRAST:  80mL OMNIPAQUE IOHEXOL 350 MG/ML SOLN COMPARISON:  None Available. FINDINGS: CT HEAD FINDINGS Brain: There is no mass, hemorrhage or extra-axial collection. The size and configuration of the ventricles and extra-axial CSF spaces are normal. There is no acute or chronic infarction. There is hypoattenuation of the periventricular white matter, most commonly indicating chronic ischemic microangiopathy. Skull: The visualized skull base, calvarium and extracranial soft tissues are normal. Sinuses/Orbits: No fluid levels or advanced mucosal thickening of the visualized paranasal sinuses. No mastoid or middle ear effusion. The orbits are  normal. CTA NECK FINDINGS SKELETON: There is no bony spinal canal stenosis. No lytic or blastic lesion. OTHER NECK: Normal pharynx, larynx and major salivary glands. No cervical lymphadenopathy. Unremarkable thyroid gland. UPPER CHEST: No pneumothorax or pleural effusion. No nodules or masses. AORTIC ARCH: There is calcific atherosclerosis of the aortic arch. Conventional 3 vessel aortic branching pattern. RIGHT CAROTID SYSTEM: No dissection, occlusion or aneurysm. There is calcified atherosclerosis extending into the proximal ICA, resulting in 50% stenosis. LEFT CAROTID SYSTEM: No dissection, occlusion or aneurysm. There is calcified atherosclerosis extending into the proximal ICA, resulting in less than 50% stenosis. VERTEBRAL ARTERIES: Left dominant configuration.There is no dissection, occlusion or flow-limiting stenosis to the skull base (V1-V3 segments). CTA HEAD FINDINGS POSTERIOR CIRCULATION: --Vertebral arteries: Normal V4 segments. --Inferior cerebellar arteries: Normal. --Basilar artery: Normal. --Superior cerebellar arteries: Normal. --Posterior cerebral arteries (PCA): Normal. ANTERIOR CIRCULATION: --Intracranial internal carotid arteries: Normal. --Anterior cerebral arteries (ACA): Normal. Both A1 segments are present. Patent anterior communicating artery (a-comm). --Middle cerebral arteries (MCA): Normal. VENOUS SINUSES: As permitted by contrast timing, patent. ANATOMIC VARIANTS: Fetal origin of the right posterior cerebral artery. Review of the MIP images confirms the above findings. IMPRESSION: 1. No emergent large vessel occlusion or high-grade stenosis of the intracranial arteries. 2. Bilateral carotid bifurcation atherosclerosis resulting in 50%  stenosis of the proximal right internal carotid artery and slightly less than 50% stenosis of the proximal left internal carotid artery. 3. Chronic ischemic microangiopathy. Aortic Atherosclerosis (ICD10-I70.0). Electronically Signed   By: Deatra Robinson  M.D.   On: 02/04/2023 22:56   DG Chest Portable 1 View  Result Date: 02/04/2023 CLINICAL DATA:  Code STEMI, chest pain EXAM: PORTABLE CHEST 1 VIEW COMPARISON:  Radiographs 11/22/2022 FINDINGS: Sternotomy and CABG. Defibrillator pads overlie the left chest. Stable cardiomediastinal silhouette. No focal consolidation, pleural effusion, or pneumothorax. No displaced rib fractures. IMPRESSION: No active disease. Electronically Signed   By: Minerva Fester M.D.   On: 02/04/2023 22:05    Pending Labs Unresulted Labs (From admission, onward)     Start     Ordered   02/05/23 0500  Prealbumin  Tomorrow morning,   R        02/05/23 0011   02/05/23 0500  Lipid panel  Tomorrow morning,   R        02/05/23 0059   02/05/23 0012  Ammonia  Once,   STAT       Question:  Release to patient  Answer:  Immediate   02/05/23 0011   02/05/23 0012  CK  Add-on,   AD        02/05/23 0011   02/05/23 0012  Osmolality  Add-on,   AD        02/05/23 0011   02/05/23 0012  Osmolality, urine  Once,   URGENT        02/05/23 0011   02/05/23 0012  TSH  Add-on,   AD        02/05/23 0011   02/05/23 0012  Sodium, urine, random  Once,   URGENT        02/05/23 0011   02/05/23 0012  Vitamin B1  Add-on,   AD        02/05/23 0011   02/05/23 0009  C Difficile Quick Screen w PCR reflex  (C Difficile quick screen w PCR reflex panel )  Once, for 24 hours,   URGENT       References:    CDiff Information Tool   02/05/23 0008   02/05/23 0009  Gastrointestinal Panel by PCR , Stool  (Gastrointestinal Panel by PCR, Stool                                                                                                                                                     **Does Not include CLOSTRIDIUM DIFFICILE testing. **If CDIFF testing is needed, place order from the "C Difficile Testing" order set.**)  Once,   URGENT        02/05/23 0008   02/04/23 2150  Lactic acid, plasma  Now then every 2 hours,   R (with STAT occurrences)  02/04/23 2149   Signed and Held  Magnesium  Tomorrow morning,   R        Signed and Held   Signed and Held  Phosphorus  Tomorrow morning,   R        Signed and Held   Signed and Held  Comprehensive metabolic panel  Tomorrow morning,   R       Question:  Release to patient  Answer:  Immediate   Signed and Held   Signed and Held  CBC  Tomorrow morning,   R       Question:  Release to patient  Answer:  Immediate   Signed and Held            Vitals/Pain Today's Vitals   02/04/23 2100 02/04/23 2101 02/05/23 0010 02/05/23 0217  BP:  (!) 172/96 (!) 189/86 (!) 155/89  Pulse:  60 (!) 122 (!) 108  Resp:  18 18 17   Temp:  97.9 F (36.6 C)  98.1 F (36.7 C)  TempSrc:  Oral  Oral  SpO2:  100% 94% 93%  Weight:  79.8 kg    Height:  5\' 4"  (1.626 m)    PainSc: 8        Isolation Precautions Airborne and Contact precautions  Medications Medications  aspirin EC tablet 325 mg (325 mg Oral Given 02/04/23 2209)  thiamine (VITAMIN B1) 500 mg in sodium chloride 0.9 % 50 mL IVPB (has no administration in time range)  labetalol (NORMODYNE) injection 10 mg (has no administration in time range)  fentaNYL (SUBLIMAZE) injection 12.5-50 mcg (50 mcg Intravenous Given 02/05/23 0255)  LORazepam (ATIVAN) injection 0.5 mg (has no administration in time range)  ondansetron (ZOFRAN) injection 4 mg (4 mg Intravenous Given 02/04/23 2155)  potassium chloride 10 mEq in 100 mL IVPB (10 mEq Intravenous New Bag/Given 02/05/23 0223)  iohexol (OMNIPAQUE) 350 MG/ML injection 80 mL (80 mLs Intravenous Contrast Given 02/04/23 2228)  lactated ringers bolus 500 mL (500 mLs Intravenous Bolus 02/04/23 2224)  fentaNYL (SUBLIMAZE) injection 12.5 mcg (12.5 mcg Intravenous Given 02/04/23 2220)  magnesium sulfate IVPB 2 g 50 mL (0 g Intravenous Stopped 02/05/23 0219)  lactated ringers bolus 500 mL (0 mLs Intravenous Stopped 02/05/23 0219)  midazolam (VERSED) injection 0.5 mg (0.5 mg Intravenous Given 02/05/23 0014)    Mobility walks with  person assist     Report given to: Westley Gambles

## 2023-02-05 NOTE — Progress Notes (Signed)
Patient is nauseated and cannot lie still for echo at this time.

## 2023-02-06 ENCOUNTER — Ambulatory Visit: Payer: PPO | Admitting: Nurse Practitioner

## 2023-02-06 ENCOUNTER — Inpatient Hospital Stay (HOSPITAL_COMMUNITY): Payer: PPO

## 2023-02-06 ENCOUNTER — Inpatient Hospital Stay (HOSPITAL_COMMUNITY)
Admit: 2023-02-06 | Discharge: 2023-02-06 | Disposition: A | Discharge status: Home / Self Care | Payer: PPO | Attending: Internal Medicine | Admitting: Internal Medicine

## 2023-02-06 DIAGNOSIS — I639 Cerebral infarction, unspecified: Secondary | ICD-10-CM | POA: Diagnosis not present

## 2023-02-06 DIAGNOSIS — R569 Unspecified convulsions: Secondary | ICD-10-CM | POA: Diagnosis not present

## 2023-02-06 DIAGNOSIS — I480 Paroxysmal atrial fibrillation: Secondary | ICD-10-CM | POA: Diagnosis not present

## 2023-02-06 DIAGNOSIS — R4182 Altered mental status, unspecified: Secondary | ICD-10-CM | POA: Diagnosis not present

## 2023-02-06 DIAGNOSIS — G9340 Encephalopathy, unspecified: Secondary | ICD-10-CM | POA: Diagnosis not present

## 2023-02-06 LAB — COMPREHENSIVE METABOLIC PANEL
ALT: 21 U/L (ref 0–44)
AST: 46 U/L — ABNORMAL HIGH (ref 15–41)
Albumin: 3.5 g/dL (ref 3.5–5.0)
Alkaline Phosphatase: 43 U/L (ref 38–126)
Anion gap: 10 (ref 5–15)
BUN: 14 mg/dL (ref 8–23)
CO2: 30 mmol/L (ref 22–32)
Calcium: 8.4 mg/dL — ABNORMAL LOW (ref 8.9–10.3)
Chloride: 93 mmol/L — ABNORMAL LOW (ref 98–111)
Creatinine, Ser: 1.2 mg/dL — ABNORMAL HIGH (ref 0.44–1.00)
GFR, Estimated: 45 mL/min — ABNORMAL LOW (ref >60)
Glucose, Bld: 140 mg/dL — ABNORMAL HIGH (ref 70–99)
Potassium: 2.9 mmol/L — ABNORMAL LOW (ref 3.5–5.1)
Sodium: 133 mmol/L — ABNORMAL LOW (ref 135–145)
Total Bilirubin: 0.9 mg/dL (ref 0.3–1.2)
Total Protein: 6.7 g/dL (ref 6.5–8.1)

## 2023-02-06 LAB — RETICULOCYTES
Immature Retic Fract: 11.5 % (ref 2.3–15.9)
RBC.: 5.03 MIL/uL (ref 3.87–5.11)
Retic Count, Absolute: 74.9 10*3/uL (ref 19.0–186.0)
Retic Ct Pct: 1.5 % (ref 0.4–3.1)

## 2023-02-06 LAB — CBC
HCT: 36.7 % (ref 36.0–46.0)
Hemoglobin: 10.8 g/dL — ABNORMAL LOW (ref 12.0–15.0)
MCH: 21.5 pg — ABNORMAL LOW (ref 26.0–34.0)
MCHC: 29.4 g/dL — ABNORMAL LOW (ref 30.0–36.0)
MCV: 73.1 fL — ABNORMAL LOW (ref 80.0–100.0)
Platelets: 255 10*3/uL (ref 150–400)
RBC: 5.02 MIL/uL (ref 3.87–5.11)
RDW: 18.3 % — ABNORMAL HIGH (ref 11.5–15.5)
WBC: 14.3 10*3/uL — ABNORMAL HIGH (ref 4.0–10.5)
nRBC: 0 % (ref 0.0–0.2)

## 2023-02-06 LAB — FERRITIN: Ferritin: 13 ng/mL (ref 11–307)

## 2023-02-06 LAB — LIPID PANEL
Cholesterol: 144 mg/dL (ref 0–200)
HDL: 46 mg/dL (ref >40)
LDL Cholesterol: 80 mg/dL (ref 0–99)
Total CHOL/HDL Ratio: 3.1 RATIO
Triglycerides: 92 mg/dL (ref <150)
VLDL: 18 mg/dL (ref 0–40)

## 2023-02-06 LAB — IRON AND TIBC
Iron: 76 ug/dL (ref 28–170)
Saturation Ratios: 17 % (ref 10.4–31.8)
TIBC: 438 ug/dL (ref 250–450)
UIBC: 362 ug/dL

## 2023-02-06 LAB — PHOSPHORUS: Phosphorus: 3.3 mg/dL (ref 2.5–4.6)

## 2023-02-06 LAB — POTASSIUM: Potassium: 3.7 mmol/L (ref 3.5–5.1)

## 2023-02-06 LAB — TROPONIN I (HIGH SENSITIVITY): Troponin I (High Sensitivity): 735 ng/L (ref <18)

## 2023-02-06 LAB — HEMOGLOBIN A1C
Hgb A1c MFr Bld: 5.6 % (ref 4.8–5.6)
Mean Plasma Glucose: 114.02 mg/dL

## 2023-02-06 LAB — MAGNESIUM: Magnesium: 1.6 mg/dL — ABNORMAL LOW (ref 1.7–2.4)

## 2023-02-06 LAB — VITAMIN B12: Vitamin B-12: 370 pg/mL (ref 180–914)

## 2023-02-06 LAB — FOLATE: Folate: 33.6 ng/mL (ref >5.9)

## 2023-02-06 MED ORDER — MAGNESIUM SULFATE 2 GM/50ML IV SOLN
2.0000 g | Freq: Once | INTRAVENOUS | Status: AC
  Administered 2023-02-06: 2 g via INTRAVENOUS
  Filled 2023-02-06: qty 50

## 2023-02-06 MED ORDER — FUROSEMIDE 10 MG/ML IJ SOLN
40.0000 mg | Freq: Once | INTRAMUSCULAR | Status: AC
  Administered 2023-02-06: 40 mg via INTRAVENOUS
  Filled 2023-02-06: qty 4

## 2023-02-06 MED ORDER — POTASSIUM CHLORIDE CRYS ER 20 MEQ PO TBCR
40.0000 meq | EXTENDED_RELEASE_TABLET | Freq: Once | ORAL | Status: AC
  Administered 2023-02-06: 40 meq via ORAL
  Filled 2023-02-06: qty 2

## 2023-02-06 NOTE — Progress Notes (Signed)
PT Cancellation Note  Patient Details Name: GAO BLAUER MRN: [id number] DOB: 04-16-1941   Cancelled Treatment:     PT order received but eval deferred on request of RN 2* pt weakness and mentation.  Will follow.Marland Kitchen   Micaila Ziemba 02/06/2023, 1:52 PM

## 2023-02-06 NOTE — Consult Note (Signed)
Reason for Consult: Chronic nausea for the last 1 year. Referring Physician: [facility] hospital[facility].  Kara Bush is an 82 y.o. female.  HPI: Kara Bush Is a 82 year old white female with multiple medical problems listed below who gives a 1 year history of nausea that comes in waves off and on with occasional hypersalivation but no definite problems with vomiting. She has had severe headaches but cannot tell me for sure whether the nausea is associated with headaches.  She claims at times the nausea can get severe where she has to go and lay down for a little while before the nausea passes.  She denies having any hematemesis.  Of late she has had some problems with constipation which she can go a day or 2 without having a BM but also occasionally she has some loose stools.  She has a history of ulcerative colitis and is followed by Dr. Jeani Hawking.  Her history is also complicated with chronic kidney disease and thoracic aneurysm.  She was hospitalized in April of this year for anemia and requiring blood transfusions and had a EGD that showed hiatal hernia and a colonoscopy which showed stable UC. She is lost 10 pounds over the last couple of months because of her poor oral intake.  She had some slurred speech recently with difficulty finding words but today she seems very clear and coherent in her conversation with me.  Past Medical History:  Diagnosis Date   Abdominal aortic aneurysm (HCC)    Anemia, improved 01/11/2013   Basal cell carcinoma    CAD (coronary artery disease)    hx CABG 1998, last cath 2007 with PCI and stenting to the proximal segment of the vein graft to the diagonal branch. DES Taxus was placed   Cervicalgia    Chronic fatigue fibromyalgia syndrome    Chronic pain syndrome    Depression    Fibromyalgia    History of nuclear stress test 08/07/2011   lexiscan; no evidence of ischemia or infarct; low risk    Hyperlipidemia    Hypertension    OSA (obstructive sleep apnea)     PAD (peripheral artery disease) (HCC)    Pes anserine bursitis    Pulmonary embolism (HCC) 01/11/2013   S/P resection of aortic aneurysm    Trochanteric bursitis    Ulcerative colitis [facility])    Past Surgical History:  Procedure Laterality Date   AORTOBIEXTERNAL ILIAC BYPASS  1991   BIOPSY  11/25/2022   Procedure: BIOPSY;  Surgeon: Jeani Hawking, MD;  Location: [facility] ENDOSCOPY;  Service: Gastroenterology;;   CATARACT EXTRACTION Left 09/2019   CHOLECYSTECTOMY  1997   COLONOSCOPY WITH PROPOFOL N/A 11/25/2022   Procedure: COLONOSCOPY WITH PROPOFOL;  Surgeon: Jeani Hawking, MD;  Location: [facility] ENDOSCOPY;  Service: Gastroenterology;  Laterality: N/A;   CORONARY ANGIOPLASTY WITH STENT PLACEMENT  2006   to LAD, ATRETIC LIMA AT THAT TIME ALSO   CORONARY ANGIOPLASTY WITH STENT PLACEMENT  2007   STENT TO PROX VG-DIAG, OTHER STENTS PATENT   CORONARY ARTERY BYPASS GRAFT  1998   LIMA-LAD;VG-DIAG & RCA   CORONARY STENT PLACEMENT  2005   TO LAD & LCX-STENTS,   ESOPHAGOGASTRODUODENOSCOPY (EGD) WITH PROPOFOL N/A 11/25/2022   Procedure: ESOPHAGOGASTRODUODENOSCOPY (EGD) WITH PROPOFOL;  Surgeon: Jeani Hawking, MD;  Location: [facility] ENDOSCOPY;  Service: Gastroenterology;  Laterality: N/A;   HERNIA REPAIR     TUBAL LIGATION     BTL    Family History  Problem Relation Age of Onset   Heart disease Father  Heart attack Father        @ 70   Cancer Paternal Aunt        BREAST   Heart failure Mother        at 32   Healthy Sister    Cancer Brother        pancreatic    Heart failure Maternal Grandmother    Heart attack Maternal Grandfather    Heart attack Paternal Grandmother        at 80   Healthy Daughter    Healthy Daughter    Social History:  reports that she quit smoking about 32 years ago. Her smoking use included cigarettes. She has a 50.00 pack-year smoking history. She has never been exposed to tobacco smoke. She has never used smokeless tobacco. She reports that she does not drink alcohol and does  not use drugs.  Allergies:  Allergies  Allergen Reactions   Biotin Hives   Lisinopril Cough   Atenolol Other (See Comments)    colitis Other reaction(s): Bowel issues   Duloxetine Hcl Other (See Comments)    Mad pt feel spacey Other reaction(s): shaky; tired in the PMs   Lyrica [Pregabalin] Other (See Comments)    Made pt feel spacey   Penicillin G Rash    Other reaction(s): rash   Penicillins Rash   Sulfamethoxazole-Trimethoprim Nausea And Vomiting    Other reaction(s): elevated liver functions   Medications: I have reviewed the patient's current medications. Prior to Admission:  Medications Prior to Admission  Medication Sig Dispense Refill Last Dose   amLODipine (NORVASC) 5 MG tablet TAKE ONE TABLET BY MOUTH ONCE daily 90 tablet 3 02/04/2023   atorvastatin (LIPITOR) 20 MG tablet Take 1 tablet (20 mg total) by mouth daily. 30 tablet 1 02/04/2023   b complex vitamins capsule Take 1 capsule by mouth daily.   02/04/2023   citalopram (CELEXA) 20 MG tablet Take 1 tablet by mouth daily.   02/04/2023   ezetimibe (ZETIA) 10 MG tablet TAKE ONE TABLET BY MOUTH ONCE daily 90 tablet 3 02/04/2023   furosemide (LASIX) 40 MG tablet Take 1 tablet (40 mg total) by mouth 3 (three) times a week. Increase to every day as needed for weight gain of 2lbs overnight or 5lbs in 1 week (Patient taking differently: Take 40 mg by mouth daily as needed for fluid or edema. Increase to every day as needed for weight gain of 2lbs overnight or 5lbs in 1 week) 90 tablet 1 02/04/2023   HYDROcodone-acetaminophen (NORCO/VICODIN) 5-325 MG tablet Take 1 tablet by mouth 4 (four) times daily as needed for moderate pain. May Take an extra 0.5 to one tablet when pain is severe. (Patient taking differently: Take 0.5 tablets by mouth every 2 (two) hours as needed for moderate pain or severe pain. May Take an extra 0.5 to one tablet when pain is severe.) 140 tablet 0 02/04/2023   isosorbide mononitrate (IMDUR) 30 MG 24 hr tablet Take 0.5  tablets (15 mg total) by mouth daily. 45 tablet 3 02/04/2023   methocarbamol (ROBAXIN) 500 MG tablet TAKE 1 TABLET BY MOUTH EVERY 8 HOURS AS NEEDED FOR MUSCLE SPASM (Patient taking differently: Take 500 mg by mouth every 8 (eight) hours as needed for muscle spasms.) 30 tablet 1 unknown   metoprolol succinate (TOPROL-XL) 50 MG 24 hr tablet TAKE ONE TABLET BY MOUTH ONCE daily (Patient taking differently: Take 50 mg by mouth daily.) 90 tablet 1 02/04/2023   Multiple Vitamin (MULITIVITAMIN WITH MINERALS) TABS  Take 1 tablet by mouth daily.   02/04/2023   ondansetron (ZOFRAN) 4 MG tablet Take 4 mg by mouth every 8 (eight) hours as needed for nausea or vomiting.   unknown   potassium chloride (KLOR-CON M) 10 MEQ tablet Take 1 tablet (10 mEq total) by mouth 3 (three) times a week. And with extra lasix as needed. (Patient taking differently: Take 10 mEq by mouth daily. And with extra lasix as needed.) 90 tablet 3 02/04/2023   zolpidem (AMBIEN CR) 12.5 MG CR tablet Take 12.5 mg by mouth at bedtime.   02/03/2023   apixaban (ELIQUIS) 2.5 MG TABS tablet Take 1 tablet (2.5 mg total) by mouth 2 (two) times daily. 60 tablet 13 November 2022   betamethasone dipropionate 0.05 % cream Apply 1 Application topically daily.   unknown   clobetasol ointment (TEMOVATE) 0.05 % Apply 1 Application topically 2 (two) times daily.   unknown   Scheduled:   stroke: early stages of recovery book   Does not apply Once   amLODipine  5 mg Oral Daily   apixaban  2.5 mg Oral BID   atorvastatin  20 mg Oral Daily   Chlorhexidine Gluconate Cloth  6 each Topical Daily   citalopram  20 mg Oral Daily   isosorbide mononitrate  15 mg Oral Daily   metoprolol succinate  50 mg Oral Daily   Continuous:  dextrose 5 % and 0.45 % NaCl with KCl 40 mEq/L 100 mL/hr at 02/06/23 0608   methocarbamol (ROBAXIN) IV Stopped (02/05/23 0733)   promethazine (PHENERGAN) injection (IM or IVPB)     thiamine (VITAMIN B1) injection Stopped (02/05/23 1250)    ZOX:[id number], HYDROcodone-acetaminophen, HYDROmorphone (DILAUDID) injection, labetalol, LORazepam, methocarbamol (ROBAXIN) IV, metoCLOPramide (REGLAN) injection, promethazine (PHENERGAN) injection (IM or IVPB)  Results for orders placed or performed during the hospital encounter of 02/04/23 (from the past 48 hour(s))  Basic metabolic panel     Status: Abnormal   Collection Time: 02/04/23  9:15 PM  Result Value Ref Range   Sodium 133 (L) 135 - 145 mmol/L   Potassium 2.1 (LL) 3.5 - 5.1 mmol/L    Comment: CRITICAL RESULT CALLED TO, READ BACK BY AND VERIFIED WITH WINGFIELD,B RN @ 2150 BY COLE,J 02/04/23   Chloride 83 (L) 98 - 111 mmol/L   CO2 33 (H) 22 - 32 mmol/L   Glucose, Bld 141 (H) 70 - 99 mg/dL    Comment: Glucose reference range applies only to samples taken after fasting for at least 8 hours.   BUN 19 8 - 23 mg/dL   Creatinine, Ser 8.11 (H) 0.44 - 1.00 mg/dL   Calcium 9.7 8.9 - 91.4 mg/dL   GFR, Estimated 39 (L) >60 mL/min    Comment: (NOTE) Calculated using the CKD-EPI Creatinine Equation (2021)    Anion gap 17 (H) 5 - 15    Comment: Performed at [facility], [address], [place], [place] [address]  CBC     Status: Abnormal   Collection Time: 02/04/23  9:15 PM  Result Value Ref Range   WBC 13.5 (H) 4.0 - 10.5 K/uL   RBC 6.70 (H) 3.87 - 5.11 MIL/uL   Hemoglobin 14.1 12.0 - 15.0 g/dL   HCT 62.1 (H) 30.8 - 65.7 %   MCV 70.4 (L) 80.0 - 100.0 fL   MCH 21.0 (L) 26.0 - 34.0 pg   MCHC 29.9 (L) 30.0 - 36.0 g/dL   RDW 84.6 (H) 96.2 - 95.2 %   Platelets 304  150 - 400 K/uL   nRBC 0.0 0.0 - 0.2 %    Comment: Performed at [facility], [address], [place], [place] [address]  Troponin I (High Sensitivity)     Status: Abnormal   Collection Time: 02/04/23  9:25 PM  Result Value Ref Range   Troponin I (High Sensitivity) 45 (H) <18 ng/L    Comment: (NOTE) Elevated high sensitivity troponin I (hsTnI) values and significant  changes across  serial measurements may suggest ACS but many other  chronic and acute conditions are known to elevate hsTnI results.  Refer to the "Links" section for chest pain algorithms and additional  guidance. Performed at [facility], [address], [place], [place] [address]   Lipase, blood     Status: Abnormal   Collection Time: 02/04/23  9:25 PM  Result Value Ref Range   Lipase 59 (H) 11 - 51 U/L    Comment: Performed at [facility], [address], [place], [place] [address]  Magnesium     Status: Abnormal   Collection Time: 02/04/23  9:25 PM  Result Value Ref Range   Magnesium 1.5 (L) 1.7 - 2.4 mg/dL    Comment: Performed at [facility], [address] [place], [place] [address]  CBG monitoring, ED     Status: Abnormal   Collection Time: 02/04/23  9:51 PM  Result Value Ref Range   Glucose-Capillary 166 (H) 70 - 99 mg/dL    Comment: Glucose reference range applies only to samples taken after fasting for at least 8 hours.  Blood gas, venous (at [facility])     Status: Abnormal   Collection Time: 02/04/23  9:59 PM  Result Value Ref Range   pH, Ven 7.66 (HH) 7.25 - 7.43    Comment: CRITICAL RESULT CALLED TO, READ BACK BY AND VERIFIED WITH: WOODY,A RN @ 2248 02/04/23 BY CHILDRESS,E    pCO2, Ven 40 (L) 44 - 60 mmHg   pO2, Ven 52 (H) 32 - 45 mmHg   Bicarbonate 45.1 (H) 20.0 - 28.0 mmol/L   Acid-Base Excess 22.1 (H) 0.0 - 2.0 mmol/L   O2 Saturation 90.7 %   Patient temperature 37.0     Comment: Performed at [facility], [address] [place], [place] [address]  Hepatic function panel     Status: Abnormal   Collection Time: 02/04/23 10:01 PM  Result Value Ref Range   Total Protein 8.3 (H) 6.5 - 8.1 g/dL   Albumin 4.3 3.5 - 5.0 g/dL   AST 28 15 - 41 U/L   ALT 18 0 - 44 U/L   Alkaline Phosphatase 56 38 - 126 U/L   Total Bilirubin 0.8 0.3 - 1.2 mg/dL   Bilirubin, Direct <1.3 0.0 - 0.2 mg/dL   Indirect  Bilirubin NOT CALCULATED 0.3 - 0.9 mg/dL    Comment: Performed at [facility], [address] [place], [place] [address]  Lactic acid, plasma     Status: Abnormal   Collection Time: 02/04/23 10:02 PM  Result Value Ref Range   Lactic Acid, Venous 3.2 (HH) 0.5 - 1.9 mmol/L    Comment: CRITICAL RESULT CALLED TO, READ BACK BY AND VERIFIED WITH WINGFIELD,B RN @ 2234 02/04/23 BY CHILDRESS,E Performed at [facility], [address] [place], [place] [address]   Troponin I (High Sensitivity)     Status: Abnormal   Collection Time: 02/04/23 11:27 PM  Result Value  Ref Range   Troponin I (High Sensitivity) 44 (H) <18 ng/L    Comment: (NOTE) Elevated high sensitivity troponin I (hsTnI) values and significant  changes across serial measurements may suggest ACS but many other  chronic and acute conditions are known to elevate hsTnI results.  Refer to the "Links" section for chest pain algorithms and additional  guidance. Performed at [facility], [address] [place] [address], [place], [place] [address]   Urinalysis, Routine w reflex microscopic -Urine, Clean Catch     Status: Abnormal   Collection Time: 02/04/23 11:28 PM  Result Value Ref Range   Color, Urine STRAW (A) YELLOW   APPearance CLEAR CLEAR   Specific Gravity, Urine 1.017 1.005 - 1.030   pH 7.0 5.0 - 8.0   Glucose, UA NEGATIVE NEGATIVE mg/dL   Hgb urine dipstick SMALL (A) NEGATIVE   Bilirubin Urine NEGATIVE NEGATIVE   Ketones, ur NEGATIVE NEGATIVE mg/dL   Protein, ur NEGATIVE NEGATIVE mg/dL   Nitrite NEGATIVE NEGATIVE   Leukocytes,Ua TRACE (A) NEGATIVE   RBC / HPF 0-5 0 - 5 RBC/hpf   WBC, UA 0-5 0 - 5 WBC/hpf   Bacteria, UA RARE (A) NONE SEEN   Squamous Epithelial / HPF 0-5 0 - 5 /HPF    Comment: Performed at [facility], [address] [place], [place] [address]  SARS Coronavirus 2 by RT PCR (hospital order, performed in [name] hospital lab) *cepheid single  result test* Anterior Nasal Swab     Status: None   Collection Time: 02/05/23  2:44 AM   Specimen: Anterior Nasal Swab  Result Value Ref Range   SARS Coronavirus 2 by RT PCR NEGATIVE NEGATIVE    Comment: (NOTE) SARS-CoV-2 target nucleic acids are NOT DETECTED.  The SARS-CoV-2 RNA is generally detectable in upper and lower respiratory specimens during the acute phase of infection. The lowest concentration of SARS-CoV-2 viral copies this assay can detect is 250 copies / mL. A negative result does not preclude SARS-CoV-2 infection and should not be used as the sole basis for treatment or other patient management decisions.  A negative result may occur with improper specimen collection / handling, submission of specimen other than nasopharyngeal swab, presence of viral mutation(s) within the areas targeted by this assay, and inadequate number of viral copies (<250 copies / mL). A negative result must be combined with clinical observations, patient history, and epidemiological information.  Fact Sheet for Patients:   RoadLapTop.co.za  Fact Sheet for Healthcare Providers: http://kim-miller.com/  This test is not yet approved or  cleared by the [place] FDA and has been authorized for detection and/or diagnosis of SARS-CoV-2 by FDA under an Emergency Use Authorization (EUA).  This EUA will remain in effect (meaning this test can be used) for the duration of the COVID-19 declaration under Section 564(b)(1) of the Act, 21 U.S.C. section 360bbb-3(b)(1), unless the authorization is terminated or revoked sooner.  Performed at [facility], [address] [place], [place] [address]   Ammonia     Status: None   Collection Time: 02/05/23  3:08 AM  Result Value Ref Range   Ammonia 16 9 - 35 umol/L    Comment: Performed at [facility], [address], [place], [place] [address]  CBC with  Differential/Platelet     Status: Abnormal   Collection Time: 02/05/23  3:09 AM  Result Value Ref Range   WBC 14.4 (H) 4.0 - 10.5 K/uL   RBC 5.69 (H)  3.87 - 5.11 MIL/uL   Hemoglobin 12.2 12.0 - 15.0 g/dL   HCT 16.1 09.6 - 04.5 %   MCV 70.7 (L) 80.0 - 100.0 fL   MCH 21.4 (L) 26.0 - 34.0 pg   MCHC 30.3 30.0 - 36.0 g/dL   RDW 40.9 (H) 81.1 - 91.4 %   Platelets 268 150 - 400 K/uL   nRBC 0.0 0.0 - 0.2 %   Neutrophils Relative % 84 %   Neutro Abs 11.9 (H) 1.7 - 7.7 K/uL   Lymphocytes Relative 11 %   Lymphs Abs 1.6 0.7 - 4.0 K/uL   Monocytes Relative 5 %   Monocytes Absolute 0.8 0.1 - 1.0 K/uL   Eosinophils Relative 0 %   Eosinophils Absolute 0.0 0.0 - 0.5 K/uL   Basophils Relative 0 %   Basophils Absolute 0.1 0.0 - 0.1 K/uL   Immature Granulocytes 0 %   Abs Immature Granulocytes 0.06 0.00 - 0.07 K/uL    Comment: Performed at [facility], [address], [place], [place] [address]  CK     Status: None   Collection Time: 02/05/23  3:09 AM  Result Value Ref Range   Total CK 72 38 - 234 U/L    Comment: Performed at [facility], [address] [place], [place] [address]  Osmolality     Status: None   Collection Time: 02/05/23  3:09 AM  Result Value Ref Range   Osmolality 288 275 - 295 mOsm/kg    Comment: Performed at [facility], [address] [place], [place] [address]  Troponin I (High Sensitivity)     Status: Abnormal   Collection Time: 02/05/23  3:09 AM  Result Value Ref Range   Troponin I (High Sensitivity) 43 (H) <18 ng/L    Comment: (NOTE) Elevated high sensitivity troponin I (hsTnI) values and significant  changes across serial measurements may suggest ACS but many other  chronic and acute conditions are known to elevate hsTnI results.  Refer to the "Links" section for chest pain algorithms and additional  guidance. Performed at [facility], [address], [place], [place] [address]   TSH     Status:  None   Collection Time: 02/05/23  3:10 AM  Result Value Ref Range   TSH 0.733 0.350 - 4.500 uIU/mL    Comment: Performed by a 3rd Generation assay with a functional sensitivity of <=0.01 uIU/mL. Performed at [facility], [address] [place], [place] [address]   Prealbumin     Status: None   Collection Time: 02/05/23  3:10 AM  Result Value Ref Range   Prealbumin 25 18 - 38 mg/dL    Comment: Performed at [facility], [address] [place], [place] [address]  Lipid panel     Status: Abnormal   Collection Time: 02/05/23  3:10 AM  Result Value Ref Range   Cholesterol 183 0 - 200 mg/dL   Triglycerides 94 <010 mg/dL   HDL 53 >27 mg/dL   Total CHOL/HDL Ratio 3.5 RATIO   VLDL 19 0 - 40 mg/dL   LDL Cholesterol 253 (H) 0 - 99 mg/dL    Comment:        Total Cholesterol/HDL:CHD Risk Coronary Heart Disease Risk Table                     Men   Women  1/2 Average Risk   3.4   3.3  Average Risk       5.0   4.4  2 X Average Risk   9.6   7.1  3 X Average Risk  23.4   11.0        Use the calculated Patient Ratio above and the CHD Risk Table to determine the patient's CHD Risk.        ATP III CLASSIFICATION (LDL):  <100     mg/dL   Optimal  981-191  mg/dL   Near or Above                    Optimal  130-159  mg/dL   Borderline  478-295  mg/dL   High  >621     mg/dL   Very High Performed at [facility], [address] [place] [address], [place], [place] [address]   Lactic acid, plasma     Status: None   Collection Time: 02/05/23  3:14 AM  Result Value Ref Range   Lactic Acid, Venous 1.6 0.5 - 1.9 mmol/L    Comment: Performed at [facility], [address] [place], [place] [address]  Osmolality, urine     Status: None   Collection Time: 02/05/23  3:17 AM  Result Value Ref Range   Osmolality, Ur 352 300 - 900 mOsm/kg    Comment: Performed at [facility], [address], [place], [place] [address]  Sodium, urine, random      Status: None   Collection Time: 02/05/23  3:17 AM  Result Value Ref Range   Sodium, Ur 94 mmol/L    Comment: Performed at [facility], [address] [place], [place] [address]  Glucose, capillary     Status: Abnormal   Collection Time: 02/05/23  4:45 AM  Result Value Ref Range   Glucose-Capillary 142 (H) 70 - 99 mg/dL    Comment: Glucose reference range applies only to samples taken after fasting for at least 8 hours.   Comment 1 Notify RN    Comment 2 Document in Chart   Troponin I (High Sensitivity)     Status: Abnormal   Collection Time: 02/05/23  5:37 AM  Result Value Ref Range   Troponin I (High Sensitivity) 91 (H) <18 ng/L    Comment: DELTA CHECK NOTED RESULT CALLED TO, READ BACK BY AND VERIFIED WITH HAWKINS,E. RN AT 4401 02/05/23 MULLINS,T (NOTE) Elevated high sensitivity troponin I (hsTnI) values and significant  changes across serial measurements may suggest ACS but many other  chronic and acute conditions are known to elevate hsTnI results.  Refer to the "Links" section for chest pain algorithms and additional  guidance. Performed at [facility], [address], [place], [place] [address]   Magnesium     Status: None   Collection Time: 02/05/23  5:37 AM  Result Value Ref Range   Magnesium 1.8 1.7 - 2.4 mg/dL    Comment: Performed at [facility], [address] [place], [place] [address]  Phosphorus     Status: None   Collection Time: 02/05/23  5:37 AM  Result Value Ref Range   Phosphorus 3.5 2.5 - 4.6 mg/dL    Comment: Performed at [facility], [address] [place], [place] [address]  Comprehensive metabolic panel     Status: Abnormal   Collection Time: 02/05/23  5:37 AM  Result Value Ref Range   Sodium 131 (L) 135 - 145 mmol/L   Potassium 2.1 (  LL) 3.5 - 5.1 mmol/L    Comment: CRITICAL RESULT CALLED TO, READ BACK BY AND VERIFIED WITH HAWKINS,E. RN AT 1610 02/05/23 MULLINS,T     Chloride 85 (L) 98 - 111 mmol/L   CO2 34 (H) 22 - 32 mmol/L   Glucose, Bld 150 (H) 70 - 99 mg/dL    Comment: Glucose reference range applies only to samples taken after fasting for at least 8 hours.   BUN 15 8 - 23 mg/dL   Creatinine, Ser 9.60 (H) 0.44 - 1.00 mg/dL   Calcium 8.3 (L) 8.9 - 10.3 mg/dL   Total Protein 6.7 6.5 - 8.1 g/dL   Albumin 3.6 3.5 - 5.0 g/dL   AST 25 15 - 41 U/L   ALT 17 0 - 44 U/L   Alkaline Phosphatase 43 38 - 126 U/L   Total Bilirubin 0.9 0.3 - 1.2 mg/dL   GFR, Estimated 52 (L) >60 mL/min    Comment: (NOTE) Calculated using the CKD-EPI Creatinine Equation (2021)    Anion gap 12 5 - 15    Comment: Performed at [facility], [address] [place], [place] [address]  CBC     Status: Abnormal   Collection Time: 02/05/23  5:37 AM  Result Value Ref Range   WBC 13.0 (H) 4.0 - 10.5 K/uL   RBC 5.43 (H) 3.87 - 5.11 MIL/uL   Hemoglobin 11.5 (L) 12.0 - 15.0 g/dL   HCT 81.1 91.4 - 78.2 %   MCV 70.9 (L) 80.0 - 100.0 fL   MCH 21.2 (L) 26.0 - 34.0 pg   MCHC 29.9 (L) 30.0 - 36.0 g/dL   RDW 95.6 (H) 21.3 - 08.6 %   Platelets 231 150 - 400 K/uL   nRBC 0.0 0.0 - 0.2 %    Comment: Performed at [facility], [address] [place], [place] [address]  Glucose, capillary     Status: Abnormal   Collection Time: 02/05/23  9:15 AM  Result Value Ref Range   Glucose-Capillary 158 (H) 70 - 99 mg/dL    Comment: Glucose reference range applies only to samples taken after fasting for at least 8 hours.  Troponin I (High Sensitivity)     Status: Abnormal   Collection Time: 02/05/23  2:30 PM  Result Value Ref Range   Troponin I (High Sensitivity) 633 (HH) <18 ng/L    Comment: CRITICAL RESULT CALLED TO, READ BACK BY AND VERIFIED WITH [place], M RN @ 1528 ON 02/05/2023 BY Deedra Ehrich, K (NOTE) Elevated high sensitivity troponin I (hsTnI) values and significant  changes across serial measurements may suggest ACS but many other  chronic and acute  conditions are known to elevate hsTnI results.  Refer to the "Links" section for chest pain algorithms and additional  guidance. Performed at [facility], [address], [place], [place] [address]   Potassium     Status: Abnormal   Collection Time: 02/05/23  3:52 PM  Result Value Ref Range   Potassium 2.9 (L) 3.5 - 5.1 mmol/L    Comment: Performed at [facility], [address], [place], [place] [address]  CBC     Status: Abnormal   Collection Time: 02/06/23  2:59 AM  Result Value Ref Range   WBC 14.3 (H) 4.0 - 10.5 K/uL   RBC 5.02 3.87 - 5.11 MIL/uL   Hemoglobin 10.8 (L) 12.0 - 15.0 g/dL   HCT 24.4 01.0 - 27.2 %   MCV 73.1 (L) 80.0 -  100.0 fL   MCH 21.5 (L) 26.0 - 34.0 pg   MCHC 29.4 (L) 30.0 - 36.0 g/dL   RDW 16.1 (H) 09.6 - 04.5 %   Platelets 255 150 - 400 K/uL   nRBC 0.0 0.0 - 0.2 %    Comment: Performed at [facility], [address], [place], [place] [address]  Comprehensive metabolic panel     Status: Abnormal   Collection Time: 02/06/23  2:59 AM  Result Value Ref Range   Sodium 133 (L) 135 - 145 mmol/L   Potassium 2.9 (L) 3.5 - 5.1 mmol/L   Chloride 93 (L) 98 - 111 mmol/L   CO2 30 22 - 32 mmol/L   Glucose, Bld 140 (H) 70 - 99 mg/dL    Comment: Glucose reference range applies only to samples taken after fasting for at least 8 hours.   BUN 14 8 - 23 mg/dL   Creatinine, Ser 1.91 (H) 0.44 - 1.00 mg/dL   Calcium 8.4 (L) 8.9 - 10.3 mg/dL   Total Protein 6.7 6.5 - 8.1 g/dL   Albumin 3.5 3.5 - 5.0 g/dL   AST 46 (H) 15 - 41 U/L   ALT 21 0 - 44 U/L   Alkaline Phosphatase 43 38 - 126 U/L   Total Bilirubin 0.9 0.3 - 1.2 mg/dL   GFR, Estimated 45 (L) >60 mL/min    Comment: (NOTE) Calculated using the CKD-EPI Creatinine Equation (2021)    Anion gap 10 5 - 15    Comment: Performed at [facility], [address], [place], [place] [address]  Magnesium     Status: Abnormal   Collection Time:  02/06/23  2:59 AM  Result Value Ref Range   Magnesium 1.6 (L) 1.7 - 2.4 mg/dL    Comment: Performed at [facility], [address], [place], [place] [address]  Phosphorus     Status: None   Collection Time: 02/06/23  2:59 AM  Result Value Ref Range   Phosphorus 3.3 2.5 - 4.6 mg/dL    Comment: Performed at [facility], [address], [place], [place] [address]  Vitamin B12     Status: None   Collection Time: 02/06/23  2:59 AM  Result Value Ref Range   Vitamin B-12 370 180 - 914 pg/mL    Comment: (NOTE) This assay is not validated for testing neonatal or myeloproliferative syndrome specimens for Vitamin B12 levels. Performed at [facility], [address] [place], [place] [address]   Folate     Status: None   Collection Time: 02/06/23  2:59 AM  Result Value Ref Range   Folate 33.6 >5.9 ng/mL    Comment: RESULT CONFIRMED BY MANUAL DILUTION Performed at [facility], [address], [place], [place] [address]   Iron and TIBC     Status: None   Collection Time: 02/06/23  2:59 AM  Result Value Ref Range   Iron 76 28 - 170 ug/dL   TIBC 413 244 - 010 ug/dL   Saturation Ratios 17 10.4 - 31.8 %   UIBC 362 ug/dL    Comment: Performed at [facility], [address], [place], [place] [address]  Ferritin     Status: None   Collection Time: 02/06/23  2:59 AM  Result Value Ref Range   Ferritin 13 11 - 307 ng/mL    Comment: Performed at [facility], [address] [place], [place] [address]  Reticulocytes     Status: None   Collection Time: 02/06/23  2:59 AM  Result Value Ref Range   Retic Ct Pct 1.5 0.4 - 3.1 %   RBC. 5.03 3.87 - 5.11 MIL/uL   Retic Count, Absolute 74.9 19.0 - 186.0 K/uL   Immature Retic Fract 11.5 2.3 - 15.9 %    Comment: Performed at [facility], [address], [place], [place] [address]  Lipid panel     Status: None    Collection Time: 02/06/23  2:59 AM  Result Value Ref Range   Cholesterol 144 0 - 200 mg/dL   Triglycerides 92 <782 mg/dL   HDL 46 >95 mg/dL   Total CHOL/HDL Ratio 3.1 RATIO   VLDL 18 0 - 40 mg/dL   LDL Cholesterol 80 0 - 99 mg/dL    Comment:        Total Cholesterol/HDL:CHD Risk Coronary Heart Disease Risk Table                     Men   Women  1/2 Average Risk   3.4   3.3  Average Risk       5.0   4.4  2 X Average Risk   9.6   7.1  3 X Average Risk  23.4   11.0        Use the calculated Patient Ratio above and the CHD Risk Table to determine the patient's CHD Risk.        ATP III CLASSIFICATION (LDL):  <100     mg/dL   Optimal  621-308  mg/dL   Near or Above                    Optimal  130-159  mg/dL   Borderline  657-846  mg/dL   High  >962     mg/dL   Very High Performed at [facility], [address] [place], [place] [address]   Hemoglobin A1c     Status: None   Collection Time: 02/06/23  2:59 AM  Result Value Ref Range   Hgb A1c MFr Bld 5.6 4.8 - 5.6 %    Comment: (NOTE) Pre diabetes:          5.7%-6.4%  Diabetes:              >6.4%  Glycemic control for   <7.0% adults with diabetes    Mean Plasma Glucose 114.02 mg/dL    Comment: Performed at [facility], [address] [place], [place] [address]    ECHOCARDIOGRAM COMPLETE  Result Date: 02/05/2023    ECHOCARDIOGRAM REPORT   Patient Name:   KARLIE SAKER Date of Exam: 02/05/2023 Medical Rec #:  [id number]          Height:       64.0 in Accession #:    [id number]         Weight:       176.8 lb Date of Birth:  Sep 16, 1940         BSA:          1.857 m Patient Age:    81 years           BP:           161/67 mmHg Patient Gender: F                  HR:  86 bpm. Exam Location:  Inpatient Procedure: 2D Echo, Cardiac Doppler and Color Doppler Indications:    elevated troponin  History:        Patient has prior history of Echocardiogram examinations, most                 recent  11/23/2022. Prior CABG, PAD and chronic kidney disease,                 Arrythmias:Atrial Fibrillation, Signs/Symptoms:Chest Pain; Risk                 Factors:Sleep Apnea and Dyslipidemia.  Sonographer:    Delcie Roch RDCS Referring Phys: [id number] ANASTASSIA DOUTOVA IMPRESSIONS  1. Left ventricular ejection fraction, by estimation, is 55 to 60%. The left ventricle has normal function. The left ventricle has no regional wall motion abnormalities. Left ventricular diastolic parameters are indeterminate.  2. Right ventricular systolic function is normal. The right ventricular size is normal. There is normal pulmonary artery systolic pressure.  3. Left atrial size was mildly dilated.  4. The mitral valve is degenerative. Mild mitral valve regurgitation. No evidence of mitral stenosis.  5. Partial fusion of right and left cusps. The aortic valve is tricuspid. There is moderate calcification of the aortic valve. There is moderate thickening of the aortic valve. Aortic valve regurgitation is mild. Moderate aortic valve stenosis.  6. The inferior vena cava is normal in size with greater than 50% respiratory variability, suggesting right atrial pressure of 3 mmHg. FINDINGS  Left Ventricle: Left ventricular ejection fraction, by estimation, is 55 to 60%. The left ventricle has normal function. The left ventricle has no regional wall motion abnormalities. The left ventricular internal cavity size was normal in size. There is  no left ventricular hypertrophy. Left ventricular diastolic parameters are indeterminate. Right Ventricle: The right ventricular size is normal. No increase in right ventricular wall thickness. Right ventricular systolic function is normal. There is normal pulmonary artery systolic pressure. The tricuspid regurgitant velocity is 2.69 m/s, and  with an assumed right atrial pressure of 3 mmHg, the estimated right ventricular systolic pressure is 31.9 mmHg. Left Atrium: Left atrial size was mildly dilated.  Right Atrium: Right atrial size was normal in size. Pericardium: There is no evidence of pericardial effusion. Mitral Valve: The mitral valve is degenerative in appearance. There is mild thickening of the mitral valve leaflet(s). There is mild calcification of the mitral valve leaflet(s). Mild mitral valve regurgitation. No evidence of mitral valve stenosis. Tricuspid Valve: The tricuspid valve is normal in structure. Tricuspid valve regurgitation is not demonstrated. No evidence of tricuspid stenosis. Aortic Valve: Partial fusion of right and left cusps. The aortic valve is tricuspid. There is moderate calcification of the aortic valve. There is moderate thickening of the aortic valve. Aortic valve regurgitation is mild. Moderate aortic stenosis is present. Aortic valve mean gradient measures 16.5 mmHg. Aortic valve peak gradient measures 27.8 mmHg. Aortic valve area, by VTI measures 1.19 cm. Pulmonic Valve: The pulmonic valve was normal in structure. Pulmonic valve regurgitation is mild. No evidence of pulmonic stenosis. Aorta: The aortic root is normal in size and structure. Venous: The inferior vena cava is normal in size with greater than 50% respiratory variability, suggesting right atrial pressure of 3 mmHg. IAS/Shunts: No atrial level shunt detected by color flow Doppler.  LEFT VENTRICLE PLAX 2D LVIDd:         4.40 cm LVIDs:         3.50 cm LV PW:  1.30 cm LV IVS:        1.10 cm LVOT diam:     2.20 cm LV SV:         60 LV SV Index:   32 LVOT Area:     3.80 cm  RIGHT VENTRICLE          IVC RV Basal diam:  2.90 cm  IVC diam: 2.00 cm LEFT ATRIUM             Index        RIGHT ATRIUM           Index LA diam:        4.10 cm 2.21 cm/m   RA Area:     16.10 cm LA Vol (A2C):   81.5 ml 43.90 ml/m  RA Volume:   39.40 ml  21.22 ml/m LA Vol (A4C):   66.2 ml 35.66 ml/m LA Biplane Vol: 73.3 ml 39.48 ml/m  AORTIC VALVE AV Area (Vmax):    1.16 cm AV Area (Vmean):   1.11 cm AV Area (VTI):     1.19 cm AV  Vmax:           263.75 cm/s AV Vmean:          187.500 cm/s AV VTI:            0.503 m AV Peak Grad:      27.8 mmHg AV Mean Grad:      16.5 mmHg LVOT Vmax:         80.57 cm/s LVOT Vmean:        54.833 cm/s LVOT VTI:          0.158 m LVOT/AV VTI ratio: 0.31  AORTA Ao Root diam: 3.20 cm Ao Asc diam:  3.70 cm TRICUSPID VALVE TR Peak grad:   28.9 mmHg TR Vmax:        269.00 cm/s  SHUNTS Systemic VTI:  0.16 m Systemic Diam: 2.20 cm Charlton Haws MD Electronically signed by Charlton Haws MD Signature Date/Time: 02/05/2023/2:33:00 PM    Final    MR BRAIN WO CONTRAST  Result Date: 02/05/2023 CLINICAL DATA:  Altered mental status, confusion for a few days. EXAM: MRI HEAD WITHOUT CONTRAST TECHNIQUE: Multiplanar, multiecho pulse sequences of the brain and surrounding structures were obtained without intravenous contrast. COMPARISON:  CT/CTA head and neck 1 day prior FINDINGS: Brain: There is a punctate focus of elevated DWI signal with corresponding low ADC signal in the left parietal subcortical white matter consistent with a small acute infarct. There is no other acute infarct. There is no acute intracranial hemorrhage or extra-axial fluid collection. Background parenchymal volume is within expected limits for age. The ventricles are normal in size. Patchy and confluent FLAIR signal abnormality throughout the supratentorial white matter and pons likely reflects sequela of moderate two advanced chronic small-vessel ischemic change. There are small remote infarcts in the left cerebellar hemisphere and right caudate. The pituitary and suprasellar region are normal. There is no mass lesion. There is no mass effect or midline shift. Vascular: Normal flow voids. Skull and upper cervical spine: Normal marrow signal. Sinuses/Orbits: The paranasal sinuses are clear. Bilateral lens implants are in place. The globes and orbits are otherwise unremarkable. Other: The mastoid air cells and middle ear cavities are clear. 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 No other acute intracranial pathology. Electronically Signed   By: Lesia Hausen M.D.   On: 02/05/2023 12:44   CT Angio  Chest/Abd/Pel for Dissection W and/or W/WO  Result Date: 02/04/2023 CLINICAL DATA:  Altered mental status. EXAM: CT ANGIOGRAPHY CHEST, ABDOMEN AND PELVIS TECHNIQUE: Non-contrast CT of the chest was initially obtained. Multidetector CT imaging through the chest, abdomen and pelvis was performed using the standard protocol during bolus administration of intravenous contrast. Multiplanar reconstructed images and MIPs were obtained and reviewed to evaluate the vascular anatomy. RADIATION DOSE REDUCTION: This exam was performed according to the departmental dose-optimization program which includes automated exposure control, adjustment of the mA and/or kV according to patient size and/or use of iterative reconstruction technique. CONTRAST:  80mL OMNIPAQUE IOHEXOL 350 MG/ML SOLN COMPARISON:  Chest CT dated 09/10/2020 and CT of the abdomen pelvis dated 05/19/2022. FINDINGS: CTA CHEST FINDINGS Cardiovascular: Borderline cardiomegaly. No pericardial effusion. Three-vessel coronary vascular calcification and postsurgical changes of CABG. Moderate atherosclerotic calcification of the thoracic aorta. Fusiform aneurysm of the ascending aorta measuring up to 4.2 cm in diameter. This is relatively similar to prior CT. No aortic dissection. The origins of the great vessels of the aortic arch and the central pulmonary arteries appear patent for the degree of opacification. Mediastinum/Nodes: No hilar or mediastinal adenopathy. Small hiatal hernia. The esophagus is grossly unremarkable. No mediastinal fluid collection. Lungs/Pleura: Faint diffuse hazy densities throughout the lungs may represent atelectasis or areas of air trapping related to underlying small airways versus small vessel disease. No focal consolidation, pleural effusion, or pneumothorax.  The central airways are patent. Musculoskeletal: Median sternotomy wires. No acute osseous pathology. Review of the MIP images confirms the above findings. CTA ABDOMEN AND PELVIS FINDINGS VASCULAR Aorta: Advanced atherosclerotic calcification of the abdominal aorta. Postsurgical changes of aorto bi iliac bypass. There is advanced atherosclerotic calcification with luminal narrowing of the abdominal aorta at the level of the renal arteries as well as just above the bypass graft. No aortic dissection. No periaortic fluid collection. Celiac: Atherosclerotic calcification of the origin of the celiac axis. The celiac trunk and its major branches remain patent. SMA: Atherosclerotic calcification of the origin of the SMA. The SMA remains patent. Renals: Advanced atherosclerotic calcification of the origin of the left renal artery. There is significantly diminished flow in the left renal artery. The right renal artery is patent. IMA: The IMA has been previously ligated and not visualized. Inflow: The bypass graft and the external iliac arteries are patent. There is moderate atherosclerotic calcification of the internal iliac arteries. The internal iliac arteries also remain patent. Veins: The IVC is grossly unremarkable.  No portal venous gas. Review of the MIP images confirms the above findings. NON-VASCULAR No intra-abdominal free air or free fluid. Hepatobiliary: The liver is unremarkable. No biliary dilatation. Cholecystectomy. Dilatation of the common bile duct, post cholecystectomy. No retained calcified stone noted in the central CBD. Pancreas: Unremarkable. No pancreatic ductal dilatation or surrounding inflammatory changes. Spleen: Several small splenic cysts. Indeterminate 2 cm hypoenhancing lesion in the medial spleen which appears larger compared to prior CT. This is not evaluated on this exam. Further evaluation with ultrasound on a nonemergent basis recommended. Adrenals/Urinary Tract: The adrenal glands are  unremarkable. Atrophic left kidney. Punctate nonobstructing left renal interpolar calculus. There is significant decreased perfusion to the left kidney. There is no hydronephrosis on the right. Several small right renal cyst. The visualized ureters and urinary bladder appear unremarkable. Stomach/Bowel: Small hiatal hernia. Sigmoid diverticulosis and scattered colonic diverticula without active inflammatory changes. There is no bowel obstruction or active inflammation. The appendix is normal. Lymphatic: No adenopathy. Reproductive: The uterus is  grossly unremarkable. No adnexal masses. Other: Small fat containing left inguinal hernia. Status post prior ventral hernia repair. Musculoskeletal: Osteopenia with degenerative changes of the spine. No acute osseous pathology. Review of the MIP images confirms the above findings. IMPRESSION: 1. No acute intrathoracic, abdominal, or pelvic pathology. No aortic dissection. 2. 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 Recommend annual imaging followup by CTA or MRA. This recommendation follows 2010 ACCF/AHA/AATS/ACR/ASA/SCA/SCAI/SIR/STS/SVM Guidelines for the Diagnosis and Management of Patients with Thoracic Aortic Disease. Circulation. 2010; 121: Z610-R604. Aortic aneurysm NOS (ICD10-I71.9) 3. 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 4. Atrophic left kidney with significantly diminished perfusion. 5. Colonic diverticulosis. No bowel obstruction. Normal appendix. 6. Indeterminate 2 cm hypoenhancing lesion in the medial spleen which appears larger compared to prior CT. 7.  Aortic Atherosclerosis (ICD10-I70.0). Electronically Signed   By: Elgie Collard M.D.   On: 02/04/2023 23:04   CT ANGIO HEAD NECK W WO CM  Result Date: 02/04/2023 CLINICAL DATA:  Slurred speech EXAM: CT ANGIOGRAPHY HEAD AND NECK WITH AND WITHOUT CONTRAST TECHNIQUE: Multidetector CT imaging of the head and neck was performed using the standard protocol during bolus administration of  intravenous contrast. Multiplanar CT image reconstructions and MIPs were obtained to evaluate the vascular anatomy. Carotid stenosis measurements (when applicable) are obtained utilizing NASCET criteria, using the distal internal carotid diameter as the denominator. RADIATION DOSE REDUCTION: This exam was performed according to the departmental dose-optimization program which includes automated exposure control, adjustment of the mA and/or kV according to patient size and/or use of iterative reconstruction technique. CONTRAST:  80mL OMNIPAQUE IOHEXOL 350 MG/ML SOLN COMPARISON:  None Available. FINDINGS: CT HEAD FINDINGS Brain: There is no mass, hemorrhage or extra-axial collection. The size and configuration of the ventricles and extra-axial CSF spaces are normal. There is no acute or chronic infarction. There is hypoattenuation of the periventricular white matter, most commonly indicating chronic ischemic microangiopathy. Skull: The visualized skull base, calvarium and extracranial soft tissues are normal. Sinuses/Orbits: No fluid levels or advanced mucosal thickening of the visualized paranasal sinuses. No mastoid or middle ear effusion. The orbits are normal. CTA NECK FINDINGS SKELETON: There is no bony spinal canal stenosis. No lytic or blastic lesion. OTHER NECK: Normal pharynx, larynx and major salivary glands. No cervical lymphadenopathy. Unremarkable thyroid gland. UPPER CHEST: No pneumothorax or pleural effusion. No nodules or masses. AORTIC ARCH: There is calcific atherosclerosis of the aortic arch. Conventional 3 vessel aortic branching pattern. RIGHT CAROTID SYSTEM: No dissection, occlusion or aneurysm. There is calcified atherosclerosis extending into the proximal ICA, resulting in 50% stenosis. LEFT CAROTID SYSTEM: No dissection, occlusion or aneurysm. There is calcified atherosclerosis extending into the proximal ICA, resulting in less than 50% stenosis. VERTEBRAL ARTERIES: Left dominant  configuration.There is no dissection, occlusion or flow-limiting stenosis to the skull base (V1-V3 segments). CTA HEAD FINDINGS POSTERIOR CIRCULATION: --Vertebral arteries: Normal V4 segments. --Inferior cerebellar arteries: Normal. --Basilar artery: Normal. --Superior cerebellar arteries: Normal. --Posterior cerebral arteries (PCA): Normal. ANTERIOR CIRCULATION: --Intracranial internal carotid arteries: Normal. --Anterior cerebral arteries (ACA): Normal. Both A1 segments are present. Patent anterior communicating artery (a-comm). --Middle cerebral arteries (MCA): Normal. VENOUS SINUSES: As permitted by contrast timing, patent. ANATOMIC VARIANTS: Fetal origin of the right posterior cerebral artery. Review of the MIP images confirms the above findings. IMPRESSION: 1. No emergent large vessel occlusion or high-grade stenosis of the intracranial arteries. 2. Bilateral carotid bifurcation atherosclerosis resulting in 50% stenosis of the proximal right internal carotid artery and slightly less than 50%  stenosis of the proximal left internal carotid artery. 3. Chronic ischemic microangiopathy. Aortic Atherosclerosis (ICD10-I70.0). Electronically Signed   By: Deatra Robinson M.D.   On: 02/04/2023 22:56   DG Chest Portable 1 View  Result Date: 02/04/2023 CLINICAL DATA:  Code STEMI, chest pain EXAM: PORTABLE CHEST 1 VIEW COMPARISON:  Radiographs 11/22/2022 FINDINGS: Sternotomy and CABG. Defibrillator pads overlie the left chest. Stable cardiomediastinal silhouette. No focal consolidation, pleural effusion, or pneumothorax. No displaced rib fractures. IMPRESSION: No active disease. Electronically Signed   By: Minerva Fester M.D.   On: 02/04/2023 22:05    Review of Systems Blood pressure (!) 192/71, pulse 86, temperature 98.3 F (36.8 C), temperature source Oral, resp. rate (!) 24, height 5\' 4"  (1.626 m), weight 80.2 kg, SpO2 92 %. Physical Exam Constitutional:      General: She is not in acute distress.     Appearance: She is obese. She is not ill-appearing, toxic-appearing or diaphoretic.  HENT:     Head: Normocephalic and atraumatic.     Mouth/Throat:     Mouth: Mucous membranes are moist.  Eyes:     Extraocular Movements: Extraocular movements intact.     Pupils: Pupils are equal, round, and reactive to light.  Cardiovascular:     Pulses: Normal pulses.     Heart sounds: Normal heart sounds.  Abdominal:     General: There is no distension.     Palpations: Abdomen is soft.     Tenderness: There is no abdominal tenderness. There is no guarding.  Musculoskeletal:     Cervical back: Normal range of motion and neck supple.  Skin:    General: Skin is warm and dry.  Neurological:     General: No focal deficit present.     Mental Status: She is alert and oriented to person, place, and time.   Assessment/Plan: 1) Nausea off-and-on without any vomiting but with some hypersalivation with the nausea is severe ongoing for the last 1 year-her daughter feels it might be due to multiple medications that she is taking [amlodipine and Eliquis can cause nausea] but I feel this might be related to her headaches chronic pain and fibromyalgia. 2) Chronic pain with fibromyalgia with intermittent slurred speech and mental status changes-MRI of the brain shows small acute infarct in the left parietal subcortical white matter with no acute intracranial abnormalities  3) Ulcerative colitis-currently stable. 4) Severe hypokalemia. 5) Colonic diverticulosis. 6) Aneurysm of the ascending thoracic aorta. 5) PAD with history of aorto-iliac bypass 1991 6) PAF. 7) CKD.  8) Elevated troponins. 9)  Kara Bush 02/06/2023, 6:58 AM

## 2023-02-06 NOTE — Consult Note (Signed)
Neurology Consultation Reason for Consult: stroke Referring Physician: Dr. Elvera Lennox  CC: increased weakness, slurred speech  History is obtained from:patient, daughters at bedside, chart review  HPI: Kara Bush is a 82 y.o. female with PMH significant for fibromyalgia, HLD, HTN, OSA, CAD, Aortic aneurysm (stable), PE in 2014, Chronic Pain who was admitted to [facility] 7/3 after presenting c/o nausea and vomiting x2 months without any resolution, increased weakness and intermittent slurred speech. MRI shows small acute infarct left parietal white matter.   On exam, patient has some vision deficits in the right upper field, right facial droop, oriented and follows all commands with weakness seen in bilateral lower extremities.   Daughters at bedside. Reviewed assessment and plan of care with them and the patient.   Patient's Eliquis was placed on hold in April due to GI bleed. It was then restarted as the lower dose of 2.5mg .   ROS: A 14 point ROS was performed and is negative except as noted in the HPI.     Past Medical History:  Diagnosis Date   Abdominal aortic aneurysm (HCC)    Anemia, improved 01/11/2013   Basal cell carcinoma    CAD (coronary artery disease)    hx CABG 1998, last cath 2007 with PCI and stenting to the proximal segment of the vein graft to the diagonal branch. DES Taxus was placed   Cervicalgia    Chronic fatigue fibromyalgia syndrome    Chronic pain syndrome    Depression    Fibromyalgia    History of nuclear stress test 08/07/2011   lexiscan; no evidence of ischemia or infarct; low risk    Hyperlipidemia    Hypertension    OSA (obstructive sleep apnea)    PAD (peripheral artery disease) (HCC)    Pes anserine bursitis    Pulmonary embolism (HCC) 01/11/2013   S/P resection of aortic aneurysm    Trochanteric bursitis    Ulcerative colitis (HCC)     Family History  Problem Relation Age of Onset   Heart disease Father    Heart attack  Father        @ 60   Cancer Paternal Aunt        BREAST   Heart failure Mother        at 42   Healthy Sister    Cancer Brother        pancreatic    Heart failure Maternal Grandmother    Heart attack Maternal Grandfather    Heart attack Paternal Grandmother        at 43   Healthy Daughter    Healthy Daughter     Social History:  reports that she quit smoking about 32 years ago. Her smoking use included cigarettes. She has a 50.00 pack-year smoking history. She has never been exposed to tobacco smoke. She has never used smokeless tobacco. She reports that she does not drink alcohol and does not use drugs.  Exam: Current vital signs: BP (!) 175/76   Pulse 95   Temp 98.4 F (36.9 C) (Axillary)   Resp (!) 21   Ht 5\' 4"  (1.626 m) Comment: Simultaneous filing. User may not have seen previous data.  Wt 80.2 kg   SpO2 93%   BMI 30.35 kg/m  Vital signs in last 24 hours: Temp:  [97.7 F (36.5 C)-98.7 F (37.1 C)] 98.4 F (36.9 C) (07/05 1150) Pulse Rate:  [68-101] 95 (07/05 0918) Resp:  [13-26] 21 (07/05 0800) BP: (108-207)/(66-140) 175/76 (07/05  2130) SpO2:  [90 %-99 %] 93 % (07/05 0800)   Physical Exam  Appears well-developed and well-nourished.   Neuro: Mental Status: Patient is awake, alert, oriented to person, place, month, year, and situation. Patient is able to give a clear and coherent history. No signs of aphasia or neglect Cranial Nerves: II:  Pupils are equal, round, and reactive to light. Decreased right upper field of vision.  III,IV, VI: EOMI without ptosis or diploplia.  V: Facial sensation is symmetric to temperature VII: Right facial droop VIII: hearing is intact to voice X: Uvula elevates symmetrically XI: Shoulder shrug is symmetric. XII: tongue is midline without atrophy or fasciculations.  Motor: 4+/5 BUE, 4/5 BLE. Sensory: Symmetric throughout Cerebellar: FNF and HKS are intact bilaterally  NIHSS:  1a Level of Conscious.: 0 1b LOC  Questions: 0 1c LOC Commands: 0 2 Best Gaze: 0 3 Visual: 1 4 Facial Palsy: 1 5a Motor Arm - left: 0 5b Motor Arm - Right:0  6a Motor Leg - Left: 1 6b Motor Leg - Right: 1 7 Limb Ataxia: 0 8 Sensory: 0 9 Best Language: 0 10 Dysarthria: 0 11 Extinct. 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  - Statin - Increase home Lipitor 20mg  to 40mg  due to LDL>70 - Antithrombotic - Eliquis (continue at 2.5mg  due to recent GI bleed; stroke is small enough that eliquis does not need to be held bc risk of hemorrhagic conversion is low) - DVT ppx - Eliquis - Goal normotension >48 hrs from sx onset - Telemetry monitoring for arrhythmia - 72h - Swallow screen - will be performed prior to PO intake - Stroke education - will be given - PT/OT/SLP  We will sign off. Please recall if needed. Thank you for this consult.  Follow up with Neurology 8 weeks after discharge (ambulatory referral has been placed).    Pt seen by Neuro NP/APP and later by MD. Note/plan to be edited by MD as needed.    Lynnae January, DNP, AGACNP-BC [facility] Please use AMION for contact information & EPIC for messaging.   Attending Neurohospitalist Addendum Patient seen and examined with APP/Resident. Agree with the history and physical as documented above. Agree with the plan as documented, which I helped formulate. I have edited the note above to reflect my full findings and recommendations. I have independently reviewed the chart, obtained  history, review of systems and examined the patient.I have personally reviewed pertinent head/neck/spine imaging (CT/MRI). Please feel free to call with any questions.  -- Bing Neighbors, MD [facility] [phone]  If 7pm- 7am, please page neurology on call as listed in AMION.

## 2023-02-06 NOTE — Progress Notes (Signed)
OT Cancellation Note  Patient Details Name: Kara Bush MRN: [id number] DOB: Jun 18, 1941   Cancelled Treatment:    Reason Eval/Treat Not Completed: Medical issues which prohibited therapy. RN requested for therapy to hold today, due to pt presenting with weakness and impaired cognition.    Reuben Likes, OTR/L 02/06/2023, 3:15 PM

## 2023-02-06 NOTE — Progress Notes (Signed)
SLP Cancellation Note  Patient Details Name: ANESIA HUTHER MRN: [id number] DOB: October 08, 1940   Cancelled treatment:       Reason Eval/Treat Not Completed: (P) Fatigue/lethargy limiting ability to participate;Pain limiting ability to participate (pt had terrible episode of nausea/vomiting after applesauce intake this am and was given Dilaudid, is currently lethargic, Will continue efforts for SLE but advised family to check to assure pt's bills,medications, etc are being managed at home as pt is Independent PTA.)  Rolena Infante, MS [facility] SLP Acute Rehab Services Office [phone]  Chales Abrahams 02/06/2023, 1:50 PM

## 2023-02-06 NOTE — Procedures (Signed)
Patient Name: ROSEY FERIA  MRN: [id number]  Epilepsy Attending: Charlsie Quest  Referring Physician/Provider: Therisa Doyne, MD  Date: 02/06/2023 Duration: 30.57 mins  Patient history: 82yo F with ams getting eeg to evaluate for seizure.  Level of alertness: Awake, asleep  AEDs during EEG study: Ativan  Technical aspects: This EEG study was done with scalp electrodes positioned according to the 10-20 International system of electrode placement. Electrical activity was reviewed with band pass filter of 1-70Hz , sensitivity of 7 uV/mm, display speed of 86mm/sec with a 60Hz  notched filter applied as appropriate. EEG data were recorded continuously and digitally stored.  Video monitoring was available and reviewed as appropriate.  Description: The posterior dominant rhythm consists of 8-9 Hz activity of moderate voltage (25-35 uV) seen predominantly in posterior head regions, symmetric and reactive to eye opening and eye closing. Sleep was characterized by vertex waves, sleep spindles (12 to 14 Hz), maximal frontocentral region. EEG showed intermittent generalized polymorphic 3 to 6 Hz theta-delta slowing. Hyperventilation and photic stimulation were not performed.     ABNORMALITY - Intermittent slow, generalized  IMPRESSION: This study is suggestive of mild diffuse encephalopathy, nonspecific etiology. No seizures or epileptiform discharges were seen throughout the recording.  Brookelynne Dimperio Annabelle Harman

## 2023-02-06 NOTE — Progress Notes (Signed)
PROGRESS NOTE  Kara Bush QMV:[id number] DOB: 07-Dec-1940 DOA: 02/04/2023 PCP: Jackelyn Poling, DO   LOS: 1 day   Brief Narrative / Interim history: 82 year old female with history of ulcerative colitis, CKD, ascending thoracic aneurysm comes into the hospital with confusion, nausea, vomiting.  Family tells me that she has been hospitalized in April for acute blood loss anemia requiring blood transfusions, underwent an EGD which showed hiatal hernia, and also a colonoscopy which showed stable UC comes to the hospital with progressive nausea over the last 2 months.  Since she was discharged she has been having progressive nausea, difficulties with poor p.o. intake, and an associated 10 pound weight loss.  Daughter also reports intermittent diarrhea but none currently.  Family also states that she has been more confused over the last 2 months, especially in the last several days has been having intermittent slurred speech and difficulty finding words  Subjective / 24h Interval events: Complains of increasing shortness of breath this morning  Assesement and Plan: Principal Problem:   Acute encephalopathy Active Problems:   Chest wall pain   PAD (peripheral artery disease), aortic-iliac bypass 1991, mild carotid bruits   OSA (obstructive sleep apnea)   Chronic fatigue syndrome with fibromyalgia   Accelerated hypertension   Ulcerative colitis (HCC)   Atrial fibrillation (HCC)   CKD (chronic kidney disease) stage 3, GFR 30-59 ml/min (HCC)   Elevated troponin   Hypokalemia   Hypomagnesemia   Intractable nausea and vomiting   Prolonged QT interval   Metabolic alkalosis   Principal problem Intractable nausea, vomiting -ongoing for the past 2 months, following her most recent hospitalization.  Apparently family has been working with Dr. Elnoria Howard as an outpatient to get this under control.  This is associated with a 10 pound weight loss, and now with profound hypokalemia -GI consulted, Dr.  Loreta Ave evaluated patient this morning.  Discussed with her, this is likely multifactorial  Active problems Acute CVA -with reported intermittent slurred speech and occasional confusion over the last several days.  MRI done yesterday shows a small acute CVA.  Neurology consulted.  Undergoing stroke workup  Troponin elevation -on admission, EKG showed some ST depressions in the inferolateral leads, cardiology was consulted as a code STEMI however this was canceled as it was not felt to be a STEMI.  Discussed with Dr. Jens Som this morning, this is likely demand ischemia in the setting of acute illness.  She does not have any chest pain, and echocardiogram done during this admission is reassuringly normal, with normal EF and no wall motion abnormalities.  Profound hypokalemia -replete aggressively  Chronic pain, fibromyalgia -resume home hydrocodone  PAD, history of aortoiliac bypass 1991 -CT angiogram without acute findings  Ulcerative colitis - stable  PAF - continue metoprolol, eliquis  Scheduled Meds:   stroke: early stages of recovery book   Does not apply Once   amLODipine  5 mg Oral Daily   apixaban  2.5 mg Oral BID   atorvastatin  20 mg Oral Daily   Chlorhexidine Gluconate Cloth  6 each Topical Daily   citalopram  20 mg Oral Daily   furosemide  40 mg Intravenous Once   isosorbide mononitrate  15 mg Oral Daily   metoprolol succinate  50 mg Oral Daily   Continuous Infusions:  methocarbamol (ROBAXIN) IV Stopped (02/05/23 0733)   promethazine (PHENERGAN) injection (IM or IVPB)     thiamine (VITAMIN B1) injection Stopped (02/06/23 1008)   PRN Meds:.albuterol, HYDROcodone-acetaminophen, HYDROmorphone (DILAUDID) injection, labetalol, LORazepam,  methocarbamol (ROBAXIN) IV, metoCLOPramide (REGLAN) injection, promethazine (PHENERGAN) injection (IM or IVPB)  Current Outpatient Medications  Medication Instructions   amLODipine (NORVASC) 5 mg, Oral, Daily   apixaban (ELIQUIS) 2.5 mg,  Oral, 2 times daily   atorvastatin (LIPITOR) 20 mg, Oral, Daily   b complex vitamins capsule 1 capsule, Oral, Daily   betamethasone dipropionate 0.05 % cream 1 Application, Topical, Daily   citalopram (CELEXA) 20 MG tablet 1 tablet, Oral, Daily   clobetasol ointment (TEMOVATE) 0.05 % 1 Application, Topical, 2 times daily   ezetimibe (ZETIA) 10 mg, Oral, Daily   furosemide (LASIX) 40 mg, Oral, 3 times weekly, Increase to every day as needed for weight gain of 2lbs overnight or 5lbs in 1 week   HYDROcodone-acetaminophen (NORCO/VICODIN) 5-325 MG tablet 1 tablet, Oral, 4 times daily PRN, May Take an extra 0.5 to one tablet when pain is severe.   isosorbide mononitrate (IMDUR) 15 mg, Oral, Daily   methocarbamol (ROBAXIN) 500 MG tablet TAKE 1 TABLET BY MOUTH EVERY 8 HOURS AS NEEDED FOR MUSCLE SPASM   metoprolol succinate (TOPROL-XL) 50 MG 24 hr tablet TAKE ONE TABLET BY MOUTH ONCE daily   Multiple Vitamin (MULITIVITAMIN WITH MINERALS) TABS 1 tablet, Oral, Daily,     ondansetron (ZOFRAN) 4 mg, Oral, Every 8 hours PRN   potassium chloride (KLOR-CON M) 10 MEQ tablet 10 mEq, Oral, 3 times weekly, And with extra lasix as needed.   zolpidem (AMBIEN CR) 12.5 mg, Oral, Daily at bedtime    Diet Orders (From admission, onward)     Start     Ordered   02/06/23 0906  DIET SOFT Room service appropriate? Yes; Fluid consistency: Thin  Diet effective now       Question Answer Comment  Room service appropriate? Yes   Fluid consistency: Thin      02/06/23 0905            DVT prophylaxis: apixaban (ELIQUIS) tablet 2.5 mg Start: 02/05/23 1000 SCDs Start: 02/05/23 0355 apixaban (ELIQUIS) tablet 2.5 mg   Lab Results  Component Value Date   PLT 255 02/06/2023      Code Status: DNR  Family Communication: Discussed with 2 daughters over the phone  Status is: Inpatient Remains inpatient appropriate because: severity of illness  Level of care: Stepdown  Consultants:  none  Objective: Vitals:    02/06/23 0700 02/06/23 0800 02/06/23 0802 02/06/23 0918  BP:  (!) 175/95  (!) 175/76  Pulse: 68 92  95  Resp: (!) 24 (!) 21    Temp:   98 F (36.7 C)   TempSrc:   Oral   SpO2: 90% 93%    Weight:      Height:        Intake/Output Summary (Last 24 hours) at 02/06/2023 1105 Last data filed at 02/06/2023 1015 Gross per 24 hour  Intake 2721.92 ml  Output 500 ml  Net 2221.92 ml    Wt Readings from Last 3 Encounters:  02/05/23 80.2 kg  01/23/23 84.3 kg  12/26/22 83.5 kg    Examination:  Constitutional: NAD Eyes: lids and conjunctivae normal, no scleral icterus ENMT: mmm Neck: normal, supple Respiratory: clear to auscultation bilaterally, no wheezing, no crackles. Normal respiratory effort.  Cardiovascular: Regular rate and rhythm, no murmurs / rubs / gallops. No LE edema. Abdomen: soft, no distention, no tenderness. Bowel sounds positive.   Data Reviewed: I have independently reviewed following labs and imaging studies   CBC Recent Labs  Lab 02/04/23 2115 02/05/23  0309 02/05/23 0537 02/06/23 0259  WBC 13.5* 14.4* 13.0* 14.3*  HGB 14.1 12.2 11.5* 10.8*  HCT 47.2* 40.2 38.5 36.7  PLT 304 268 231 255  MCV 70.4* 70.7* 70.9* 73.1*  MCH 21.0* 21.4* 21.2* 21.5*  MCHC 29.9* 30.3 29.9* 29.4*  RDW 19.2* 18.2* 18.4* 18.3*  LYMPHSABS  --  1.6  --   --   MONOABS  --  0.8  --   --   EOSABS  --  0.0  --   --   BASOSABS  --  0.1  --   --      Recent Labs  Lab 02/04/23 2115 02/04/23 2125 02/04/23 2201 02/04/23 2202 02/05/23 0308 02/05/23 0310 02/05/23 0314 02/05/23 0537 02/05/23 1552 02/06/23 0259  NA 133*  --   --   --   --   --   --  131*  --  133*  K 2.1*  --   --   --   --   --   --  2.1* 2.9* 2.9*  CL 83*  --   --   --   --   --   --  85*  --  93*  CO2 33*  --   --   --   --   --   --  34*  --  30  GLUCOSE 141*  --   --   --   --   --   --  150*  --  140*  BUN 19  --   --   --   --   --   --  15  --  14  CREATININE 1.35*  --   --   --   --   --   --  1.06*   --  1.20*  CALCIUM 9.7  --   --   --   --   --   --  8.3*  --  8.4*  AST  --   --  28  --   --   --   --  25  --  46*  ALT  --   --  18  --   --   --   --  17  --  21  ALKPHOS  --   --  56  --   --   --   --  43  --  43  BILITOT  --   --  0.8  --   --   --   --  0.9  --  0.9  ALBUMIN  --   --  4.3  --   --   --   --  3.6  --  3.5  MG  --  1.5*  --   --   --   --   --  1.8  --  1.6*  LATICACIDVEN  --   --   --  3.2*  --   --  1.6  --   --   --   TSH  --   --   --   --   --  0.733  --   --   --   --   HGBA1C  --   --   --   --   --   --   --   --   --  5.6  AMMONIA  --   --   --   --  16  --   --   --   --   --      ------------------------------------------------------------------------------------------------------------------  Recent Labs    02/05/23 0310 02/06/23 0259  CHOL 183 144  HDL 53 46  LDLCALC 111* 80  TRIG 94 92  CHOLHDL 3.5 3.1     Lab Results  Component Value Date   HGBA1C 5.6 02/06/2023   ------------------------------------------------------------------------------------------------------------------ Recent Labs    02/05/23 0310  TSH 0.733     Cardiac Enzymes No results for input(s): "CKMB", "TROPONINI", "MYOGLOBIN" in the last 168 hours.  Invalid input(s): "CK" ------------------------------------------------------------------------------------------------------------------    Component Value Date/Time   BNP 96.8 10/23/2021 1417   BNP 263.1 (H) 02/26/2021 1314    CBG: Recent Labs  Lab 02/04/23 2151 02/05/23 0445 02/05/23 0915  GLUCAP 166* 142* 158*     Recent Results (from the past 240 hour(s))  SARS Coronavirus 2 by RT PCR (hospital order, performed in [facility] hospital lab) *cepheid single result test* Anterior Nasal Swab     Status: None   Collection Time: 02/05/23  2:44 AM   Specimen: Anterior Nasal Swab  Result Value Ref Range Status   SARS Coronavirus 2 by RT PCR NEGATIVE NEGATIVE Final    Comment: (NOTE) SARS-CoV-2 target  nucleic acids are NOT DETECTED.  The SARS-CoV-2 RNA is generally detectable in upper and lower respiratory specimens during the acute phase of infection. The lowest concentration of SARS-CoV-2 viral copies this assay can detect is 250 copies / mL. A negative result does not preclude SARS-CoV-2 infection and should not be used as the sole basis for treatment or other patient management decisions.  A negative result may occur with improper specimen collection / handling, submission of specimen other than nasopharyngeal swab, presence of viral mutation(s) within the areas targeted by this assay, and inadequate number of viral copies (<250 copies / mL). A negative result must be combined with clinical observations, patient history, and epidemiological information.  Fact Sheet for Patients:   RoadLapTop.co.za  Fact Sheet for Healthcare Providers: http://kim-miller.com/  This test is not yet approved or  cleared by the [place] FDA and has been authorized for detection and/or diagnosis of SARS-CoV-2 by FDA under an Emergency Use Authorization (EUA).  This EUA will remain in effect (meaning this test can be used) for the duration of the COVID-19 declaration under Section 564(b)(1) of the Act, 21 U.S.C. section 360bbb-3(b)(1), unless the authorization is terminated or revoked sooner.  Performed at [facility], [address] [place], [place] [address]      Radiology Studies: ECHOCARDIOGRAM COMPLETE  Result Date: 02/05/2023    ECHOCARDIOGRAM REPORT   Patient Name:   Kara Bush Date of Exam: 02/05/2023 Medical Rec #:  [id number]          Height:       64.0 in Accession #:    [id number]         Weight:       176.8 lb Date of Birth:  07/13/1941         BSA:          1.857 m Patient Age:    81 years           BP:           161/67 mmHg Patient Gender: F                  HR:           86 bpm. Exam Location:  Inpatient  Procedure: 2D Echo, Cardiac Doppler and Color Doppler Indications:    elevated troponin  History:  Patient has prior history of Echocardiogram examinations, most                 recent 11/23/2022. Prior CABG, PAD and chronic kidney disease,                 Arrythmias:Atrial Fibrillation, Signs/Symptoms:Chest Pain; Risk                 Factors:Sleep Apnea and Dyslipidemia.  Sonographer:    Delcie Roch RDCS Referring Phys: [id number] ANASTASSIA DOUTOVA IMPRESSIONS  1. Left ventricular ejection fraction, by estimation, is 55 to 60%. The left ventricle has normal function. The left ventricle has no regional wall motion abnormalities. Left ventricular diastolic parameters are indeterminate.  2. Right ventricular systolic function is normal. The right ventricular size is normal. There is normal pulmonary artery systolic pressure.  3. Left atrial size was mildly dilated.  4. The mitral valve is degenerative. Mild mitral valve regurgitation. No evidence of mitral stenosis.  5. Partial fusion of right and left cusps. The aortic valve is tricuspid. There is moderate calcification of the aortic valve. There is moderate thickening of the aortic valve. Aortic valve regurgitation is mild. Moderate aortic valve stenosis.  6. The inferior vena cava is normal in size with greater than 50% respiratory variability, suggesting right atrial pressure of 3 mmHg. FINDINGS  Left Ventricle: Left ventricular ejection fraction, by estimation, is 55 to 60%. The left ventricle has normal function. The left ventricle has no regional wall motion abnormalities. The left ventricular internal cavity size was normal in size. There is  no left ventricular hypertrophy. Left ventricular diastolic parameters are indeterminate. Right Ventricle: The right ventricular size is normal. No increase in right ventricular wall thickness. Right ventricular systolic function is normal. There is normal pulmonary artery systolic pressure. The tricuspid  regurgitant velocity is 2.69 m/s, and  with an assumed right atrial pressure of 3 mmHg, the estimated right ventricular systolic pressure is 31.9 mmHg. Left Atrium: Left atrial size was mildly dilated. Right Atrium: Right atrial size was normal in size. Pericardium: There is no evidence of pericardial effusion. Mitral Valve: The mitral valve is degenerative in appearance. There is mild thickening of the mitral valve leaflet(s). There is mild calcification of the mitral valve leaflet(s). Mild mitral valve regurgitation. No evidence of mitral valve stenosis. Tricuspid Valve: The tricuspid valve is normal in structure. Tricuspid valve regurgitation is not demonstrated. No evidence of tricuspid stenosis. Aortic Valve: Partial fusion of right and left cusps. The aortic valve is tricuspid. There is moderate calcification of the aortic valve. There is moderate thickening of the aortic valve. Aortic valve regurgitation is mild. Moderate aortic stenosis is present. Aortic valve mean gradient measures 16.5 mmHg. Aortic valve peak gradient measures 27.8 mmHg. Aortic valve area, by VTI measures 1.19 cm. Pulmonic Valve: The pulmonic valve was normal in structure. Pulmonic valve regurgitation is mild. No evidence of pulmonic stenosis. Aorta: The aortic root is normal in size and structure. Venous: The inferior vena cava is normal in size with greater than 50% respiratory variability, suggesting right atrial pressure of 3 mmHg. IAS/Shunts: No atrial level shunt detected by color flow Doppler.  LEFT VENTRICLE PLAX 2D LVIDd:         4.40 cm LVIDs:         3.50 cm LV PW:         1.30 cm LV IVS:        1.10 cm LVOT diam:     2.20 cm LV  SV:         60 LV SV Index:   32 LVOT Area:     3.80 cm  RIGHT VENTRICLE          IVC RV Basal diam:  2.90 cm  IVC diam: 2.00 cm LEFT ATRIUM             Index        RIGHT ATRIUM           Index LA diam:        4.10 cm 2.21 cm/m   RA Area:     16.10 cm LA Vol (A2C):   81.5 ml 43.90 ml/m  RA Volume:    39.40 ml  21.22 ml/m LA Vol (A4C):   66.2 ml 35.66 ml/m LA Biplane Vol: 73.3 ml 39.48 ml/m  AORTIC VALVE AV Area (Vmax):    1.16 cm AV Area (Vmean):   1.11 cm AV Area (VTI):     1.19 cm AV Vmax:           263.75 cm/s AV Vmean:          187.500 cm/s AV VTI:            0.503 m AV Peak Grad:      27.8 mmHg AV Mean Grad:      16.5 mmHg LVOT Vmax:         80.57 cm/s LVOT Vmean:        54.833 cm/s LVOT VTI:          0.158 m LVOT/AV VTI ratio: 0.31  AORTA Ao Root diam: 3.20 cm Ao Asc diam:  3.70 cm TRICUSPID VALVE TR Peak grad:   28.9 mmHg TR Vmax:        269.00 cm/s  SHUNTS Systemic VTI:  0.16 m Systemic Diam: 2.20 cm Charlton Haws MD Electronically signed by Charlton Haws MD Signature Date/Time: 02/05/2023/2:33:00 PM    Final    MR BRAIN WO CONTRAST  Result Date: 02/05/2023 CLINICAL DATA:  Altered mental status, confusion for a few days. EXAM: MRI HEAD WITHOUT CONTRAST TECHNIQUE: Multiplanar, multiecho pulse sequences of the brain and surrounding structures were obtained without intravenous contrast. COMPARISON:  CT/CTA head and neck 1 day prior FINDINGS: Brain: There is a punctate focus of elevated DWI signal with corresponding low ADC signal in the left parietal subcortical white matter consistent with a small acute infarct. There is no other acute infarct. There is no acute intracranial hemorrhage or extra-axial fluid collection. Background parenchymal volume is within expected limits for age. The ventricles are normal in size. Patchy and confluent FLAIR signal abnormality throughout the supratentorial white matter and pons likely reflects sequela of moderate two advanced chronic small-vessel ischemic change. There are small remote infarcts in the left cerebellar hemisphere and right caudate. The pituitary and suprasellar region are normal. There is no mass lesion. There is no mass effect or midline shift. Vascular: Normal flow voids. Skull and upper cervical spine: Normal marrow signal. Sinuses/Orbits: The  paranasal sinuses are clear. Bilateral lens implants are in place. The globes and orbits are otherwise unremarkable. Other: The mastoid air cells and middle ear cavities are clear. IMPRESSION: Small acute infarct in the left parietal subcortical white matter. No other acute intracranial pathology. Electronically Signed   By: Lesia Hausen M.D.   On: 02/05/2023 12:44     Pamella Pert, MD, PhD [facility]  Between 7 am - 7 pm I am available, please contact me via Amion (for  emergencies) or Securechat (non urgent messages)  Between 7 pm - 7 am I am not available, please contact night coverage MD/APP via Amion

## 2023-02-06 NOTE — Progress Notes (Signed)
EEG complete - results pending 

## 2023-02-07 DIAGNOSIS — G9340 Encephalopathy, unspecified: Secondary | ICD-10-CM | POA: Diagnosis not present

## 2023-02-07 LAB — BASIC METABOLIC PANEL
Anion gap: 9 (ref 5–15)
BUN: 16 mg/dL (ref 8–23)
CO2: 30 mmol/L (ref 22–32)
Calcium: 8.7 mg/dL — ABNORMAL LOW (ref 8.9–10.3)
Chloride: 96 mmol/L — ABNORMAL LOW (ref 98–111)
Creatinine, Ser: 1.03 mg/dL — ABNORMAL HIGH (ref 0.44–1.00)
GFR, Estimated: 54 mL/min — ABNORMAL LOW (ref >60)
Glucose, Bld: 115 mg/dL — ABNORMAL HIGH (ref 70–99)
Potassium: 3.5 mmol/L (ref 3.5–5.1)
Sodium: 135 mmol/L (ref 135–145)

## 2023-02-07 LAB — CBC
HCT: 35.4 % — ABNORMAL LOW (ref 36.0–46.0)
Hemoglobin: 10.3 g/dL — ABNORMAL LOW (ref 12.0–15.0)
MCH: 21.7 pg — ABNORMAL LOW (ref 26.0–34.0)
MCHC: 29.1 g/dL — ABNORMAL LOW (ref 30.0–36.0)
MCV: 74.5 fL — ABNORMAL LOW (ref 80.0–100.0)
Platelets: 214 10*3/uL (ref 150–400)
RBC: 4.75 MIL/uL (ref 3.87–5.11)
RDW: 18.4 % — ABNORMAL HIGH (ref 11.5–15.5)
WBC: 12.1 10*3/uL — ABNORMAL HIGH (ref 4.0–10.5)
nRBC: 0 % (ref 0.0–0.2)

## 2023-02-07 LAB — MAGNESIUM: Magnesium: 2.2 mg/dL (ref 1.7–2.4)

## 2023-02-07 MED ORDER — AMLODIPINE BESYLATE 10 MG PO TABS: 10.0000 mg | ORAL_TABLET | Freq: Every day | ORAL | Status: DC

## 2023-02-07 MED ORDER — ONDANSETRON 4 MG PO TBDP
4.0000 mg | ORAL_TABLET | Freq: Three times a day (TID) | ORAL | Status: DC
  Administered 2023-02-07 – 2023-02-14 (×22): 4 mg via ORAL
  Filled 2023-02-07 (×24): qty 1

## 2023-02-07 MED ORDER — POTASSIUM CHLORIDE CRYS ER 20 MEQ PO TBCR
40.0000 meq | EXTENDED_RELEASE_TABLET | Freq: Once | ORAL | Status: AC
  Administered 2023-02-07: 40 meq via ORAL
  Filled 2023-02-07: qty 2

## 2023-02-07 MED ORDER — MELATONIN 5 MG PO TABS
5.0000 mg | ORAL_TABLET | Freq: Once | ORAL | Status: AC
  Administered 2023-02-08: 5 mg via ORAL
  Filled 2023-02-07: qty 1

## 2023-02-07 MED ORDER — HYDRALAZINE HCL 25 MG PO TABS
25.0000 mg | ORAL_TABLET | Freq: Three times a day (TID) | ORAL | Status: DC
  Administered 2023-02-07 – 2023-02-14 (×21): 25 mg via ORAL
  Filled 2023-02-07 (×24): qty 1

## 2023-02-07 MED ORDER — ATORVASTATIN CALCIUM 40 MG PO TABS
40.0000 mg | ORAL_TABLET | Freq: Every day | ORAL | Status: DC
  Administered 2023-02-07 – 2023-02-14 (×8): 40 mg via ORAL
  Filled 2023-02-07 (×8): qty 1

## 2023-02-07 NOTE — Evaluation (Signed)
Physical Therapy Evaluation Patient Details Name: Kara Bush MRN: [id number] DOB: July 08, 1941 Today's Date: 02/07/2023  History of Present Illness  Patient is a 82 year old female who presented with poor oral intake, progressive nausea, increased confusion, and intermittent slurred speech. Patient was admitted with acute CVA, PMH: fibromyalgia, AAA, CAD s/p CABG, chronic fatigue/chronic pain, HLD, HTN.  Clinical Impression  Pt admitted as above and presenting with functional mobility limitations 2* balance deficits and questionable safety awareness placing pt at high fall risk.  This date, pt up to ambulate limited distance in hall with RW and requiring assist for RW management and balance.  Pt motivated to progress to return home and could benefit from follow up HHPT to further address deficits and maximize IND and safety.  Pt could also benefit from increased assist/supervision by family during recovery - currently lives with grandson who works and goes to school.      Assistance Recommended at Discharge Intermittent Supervision/Assistance  If plan is discharge home, recommend the following:  Can travel by private vehicle  A little help with walking and/or transfers;A little help with bathing/dressing/bathroom;Assistance with cooking/housework;Assist for transportation;Help with stairs or ramp for entrance        Equipment Recommendations None recommended by PT  Recommendations for Other Services       Functional Status Assessment Patient has had a recent decline in their functional status and demonstrates the ability to make significant improvements in function in a reasonable and predictable amount of time.     Precautions / Restrictions Precautions Precautions: Fall Precaution Comments: monitor O2 Restrictions Weight Bearing Restrictions: No      Mobility  Bed Mobility Overal bed mobility: Needs Assistance Bed Mobility: Supine to Sit     Supine to sit:  Supervision     General bed mobility comments: for safety    Transfers Overall transfer level: Needs assistance Equipment used: Rolling walker (2 wheels) Transfers: Sit to/from Stand Sit to Stand: Min assist           General transfer comment: cues for position from surface and use of UEs to self assist    Ambulation/Gait Ambulation/Gait assistance: Min assist Gait Distance (Feet): 125 Feet Assistive device: Rolling walker (2 wheels) Gait Pattern/deviations: Step-to pattern, Step-through pattern, Decreased step length - right, Decreased step length - left, Shuffle, Staggering right, Staggering left Gait velocity: decr     General Gait Details: Unsteady on feet partially corrected with use of RW; Cues for posture, pace, position from RW and safety awareness  Stairs            Wheelchair Mobility     Tilt Bed    Modified Rankin (Stroke Patients Only)       Balance Overall balance assessment: Needs assistance Sitting-balance support: Bilateral upper extremity supported, Feet unsupported Sitting balance-Leahy Scale: Fair     Standing balance support: Bilateral upper extremity supported Standing balance-Leahy Scale: Poor                               Pertinent Vitals/Pain Pain Assessment Pain Assessment: No/denies pain    Home Living Family/patient expects to be discharged to:: Private residence Living Arrangements: Other relatives Available Help at Discharge: Family;Available PRN/intermittently Type of Home: House Home Access: Stairs to enter Entrance Stairs-Rails: Right;Left;Can reach both Entrance Stairs-Number of Steps: 4 Alternate Level Stairs-Number of Steps: Flight Home Layout: Two level;Able to live on main level with bedroom/bathroom Home Equipment:  Rollator (4 wheels);Cane - single point;Shower seat;Shower seat - built in;Grab bars - toilet;Hand held shower head      Prior Function Prior Level of Function :  Independent/Modified Independent;Driving             Mobility Comments: reported having difficulty sometimes going up stairs ADLs Comments: Ind     Hand Dominance   Dominant Hand: Right    Extremity/Trunk Assessment   Upper Extremity Assessment Upper Extremity Assessment: Overall WFL for tasks assessed    Lower Extremity Assessment Lower Extremity Assessment: Overall WFL for tasks assessed    Cervical / Trunk Assessment Cervical / Trunk Assessment: Normal  Communication   Communication: HOH  Cognition Arousal/Alertness: Awake/alert Behavior During Therapy: Flat affect Overall Cognitive Status: Difficult to assess                                 General Comments: patient had multiple family members in room will need to addess cognition during next session. patient was slow to answer questions at times during session. but alot fo distractions in room as well.        General Comments      Exercises     Assessment/Plan    PT Assessment Patient needs continued PT services  PT Problem List Decreased strength;Decreased range of motion;Decreased activity tolerance;Decreased balance;Decreased mobility;Decreased knowledge of use of DME;Decreased safety awareness       PT Treatment Interventions DME instruction;Gait training;Stair training;Functional mobility training;Therapeutic exercise;Therapeutic activities;Patient/family education;Balance training    PT Goals (Current goals can be found in the Care Plan section)  Acute Rehab PT Goals Patient Stated Goal: REgain IND PT Goal Formulation: With patient Time For Goal Achievement: 02/21/23 Potential to Achieve Goals: Good    Frequency Min 1X/week     Co-evaluation PT/OT/SLP Co-Evaluation/Treatment: Yes Reason for Co-Treatment: To address functional/ADL transfers PT goals addressed during session: Mobility/safety with mobility OT goals addressed during session: ADL's and self-care       AM-PAC  PT "6 Clicks" Mobility  Outcome Measure Help needed turning from your back to your side while in a flat bed without using bedrails?: None Help needed moving from lying on your back to sitting on the side of a flat bed without using bedrails?: None Help needed moving to and from a bed to a chair (including a wheelchair)?: A Little Help needed standing up from a chair using your arms (e.g., wheelchair or bedside chair)?: A Little Help needed to walk in hospital room?: A Little Help needed climbing 3-5 steps with a railing? : A Lot 6 Click Score: 19    End of Session Equipment Utilized During Treatment: Gait belt;Oxygen Activity Tolerance: Patient tolerated treatment well Patient left: in chair;with call bell/phone within reach;with chair alarm set;with family/visitor present Nurse Communication: Mobility status PT Visit Diagnosis: Unsteadiness on feet (R26.81);Difficulty in walking, not elsewhere classified (R26.2)    Time: 0102-7253 PT Time Calculation (min) (ACUTE ONLY): 32 min   Charges:   PT Evaluation $PT Eval Low Complexity: 1 Low   PT General Charges $$ ACUTE PT VISIT: 1 Visit         Mauro Kaufmann PT Acute Rehabilitation Services Pager [phone] Office [phone]   Kaleisha Bhargava 02/07/2023, 3:22 PM

## 2023-02-07 NOTE — Evaluation (Signed)
Occupational Therapy Evaluation Patient Details Name: ORRIS GRASSE MRN: [id number] DOB: 06-26-1941 Today's Date: 02/07/2023   History of Present Illness Patient is a 82 year old female who presented with poor oral intake, progressive nausea, increased confusion, and intermittent slurred speech. Patient was admitted with acute CVA, PMH: fibromyalgia, AAA, CAD s/p CABG, chronic fatigue/chronic pain, HLD, HTN.   Clinical Impression   Patient is a 82 year old female who was admitted for above. Patient lives at home independently with grandson who is in grad school. Patient was min A with RW for transfers with unsteadiness with movement. Patient needed 4L/min to maintain O2 saturation during assessment. Patient was noted to have decreased functional activity tolerance, decreased endurance, decreased standing balance, decreased safety awareness, and decreased knowledge of AD/AE impacting participation in ADLs. Patient would continue to benefit from skilled OT services at this time while admitted and after d/c to address noted deficits in order to improve overall safety and independence in ADLs.       Recommendations for follow up therapy are one component of a multi-disciplinary discharge planning process, led by the attending physician.  Recommendations may be updated based on patient status, additional functional criteria and insurance authorization.   Assistance Recommended at Discharge Frequent or constant Supervision/Assistance  Patient can return home with the following A little help with walking and/or transfers;A lot of help with bathing/dressing/bathroom;Assistance with cooking/housework;Direct supervision/assist for medications management;Assist for transportation;Help with stairs or ramp for entrance;Direct supervision/assist for financial management    Functional Status Assessment  Patient has had a recent decline in their functional status and demonstrates the ability to make  significant improvements in function in a reasonable and predictable amount of time.  Equipment Recommendations  None recommended by OT    Recommendations for Other Services       Precautions / Restrictions Precautions Precautions: Fall Precaution Comments: monitor O2 Restrictions Weight Bearing Restrictions: No      Mobility Bed Mobility Overal bed mobility: Needs Assistance Bed Mobility: Supine to Sit     Supine to sit: Supervision     General bed mobility comments: for safety                Balance Overall balance assessment: Needs assistance Sitting-balance support: Bilateral upper extremity supported, Feet unsupported Sitting balance-Leahy Scale: Fair     Standing balance support: Bilateral upper extremity supported Standing balance-Leahy Scale: Poor                             ADL either performed or assessed with clinical judgement   ADL Overall ADL's : Needs assistance/impaired Eating/Feeding: Sitting;Set up Eating/Feeding Details (indicate cue type and reason): in recliner Grooming: Min guard;Set up;Sitting   Upper Body Bathing: Minimal assistance;Sitting   Lower Body Bathing: Maximal assistance;Sit to/from stand;Sitting/lateral leans   Upper Body Dressing : Minimal assistance;Sitting   Lower Body Dressing: Maximal assistance;Sit to/from stand;Sitting/lateral leans   Toilet Transfer: Minimal assistance;Rolling walker (2 wheels);Ambulation Toilet Transfer Details (indicate cue type and reason): with increased time and need for BUE support to maintain balance Toileting- Clothing Manipulation and Hygiene: Maximal assistance;Sit to/from stand               Vision Baseline Vision/History: 1 Wears glasses Vision Assessment?: No apparent visual deficits         Hand Dominance Right   Extremity/Trunk Assessment Upper Extremity Assessment Upper Extremity Assessment: Overall WFL for tasks assessed   Lower Extremity  Assessment Lower Extremity Assessment: Overall WFL for tasks assessed   Cervical / Trunk Assessment Cervical / Trunk Assessment: Normal   Communication Communication Communication: HOH   Cognition Arousal/Alertness: Awake/alert Behavior During Therapy: Flat affect Overall Cognitive Status: Difficult to assess           General Comments: patient had multiple family members in room will need to addess cognition during next session. patient was slow to answer questions at times during session. but alot fo distractions in room as well.                Home Living Family/patient expects to be discharged to:: Private residence Living Arrangements: Other relatives Available Help at Discharge: Family;Available PRN/intermittently Type of Home: House Home Access: Stairs to enter [employer] of Steps: 4 Entrance Stairs-Rails: Right;Left;Can reach both Home Layout: Two level;Able to live on main level with bedroom/bathroom Alternate Level Stairs-Number of Steps: Flight Alternate Level Stairs-Rails: Can reach both;Right;Left Bathroom Shower/Tub: Producer, television/film/video: Handicapped height Bathroom Accessibility: Yes   Home Equipment: Rollator (4 wheels);Cane - single point;Shower seat;Shower seat - built in;Grab bars - toilet;Hand held shower head          Prior Functioning/Environment Prior Level of Function : Independent/Modified Independent;Driving             Mobility Comments: reported having difficulty sometimes going up stairs ADLs Comments: Ind        OT Problem List: Decreased activity tolerance;Impaired balance (sitting and/or standing);Decreased coordination;Decreased safety awareness;Decreased knowledge of precautions;Decreased knowledge of use of DME or AE      OT Treatment/Interventions: Self-care/ADL training;Therapeutic exercise;Energy conservation;DME and/or AE instruction;Therapeutic activities;Patient/family education;Balance  training    OT Goals(Current goals can be found in the care plan section) Acute Rehab OT Goals Patient Stated Goal: to get out of bed OT Goal Formulation: With patient Time For Goal Achievement: 02/21/23 Potential to Achieve Goals: Fair  OT Frequency: Min 1X/week    Co-evaluation PT/OT/SLP Co-Evaluation/Treatment: Yes Reason for Co-Treatment: To address functional/ADL transfers PT goals addressed during session: Mobility/safety with mobility OT goals addressed during session: ADL's and self-care      AM-PAC OT "6 Clicks" Daily Activity     Outcome Measure Help from another person eating meals?: A Little Help from another person taking care of personal grooming?: A Little Help from another person toileting, which includes using toliet, bedpan, or urinal?: A Little Help from another person bathing (including washing, rinsing, drying)?: A Little Help from another person to put on and taking off regular upper body clothing?: A Little Help from another person to put on and taking off regular lower body clothing?: A Little 6 Click Score: 18   End of Session Equipment Utilized During Treatment: Gait belt;Rolling walker (2 wheels) Nurse Communication: Mobility status  Activity Tolerance: Patient tolerated treatment well Patient left: in chair;with call bell/phone within reach;with family/visitor present;with chair alarm set  OT Visit Diagnosis: Unsteadiness on feet (R26.81);Other abnormalities of gait and mobility (R26.89);Repeated falls (R29.6)                Time: 1610-9604 OT Time Calculation (min): 27 min Charges:  OT General Charges $OT Visit: 1 Visit OT Evaluation $OT Eval Moderate Complexity: 1 Mod  Cope Marte OTR/L, MS Acute Rehabilitation Department Office# [phone]   Selinda Flavin 02/07/2023, 3:50 PM

## 2023-02-07 NOTE — Progress Notes (Signed)
PROGRESS NOTE  Kara Bush NWG:[id number] DOB: [birth year] DOA: 02/04/2023 PCP: Jackelyn Poling, DO   LOS: 2 days   Brief Narrative / Interim history: 82 year old female with history of ulcerative colitis, CKD, ascending thoracic aneurysm comes into the hospital with confusion, nausea, vomiting.  Family tells me that she has been hospitalized in April for acute blood loss anemia requiring blood transfusions, underwent an EGD which showed hiatal hernia, and also a colonoscopy which showed stable UC comes to the hospital with progressive nausea over the last 2 months.  Since she was discharged she has been having progressive nausea, difficulties with poor p.o. intake, and an associated 10 pound weight loss.  Daughter also reports intermittent diarrhea but none currently.  Family also states that she has been more confused over the last 2 months, especially in the last several days has been having intermittent slurred speech and difficulty finding words  Subjective / 24h Interval events: Feels okay this morning.  No significant shortness of breath, no chest pain  Assesement and Plan: Principal Problem:   Acute encephalopathy Active Problems:   Chest wall pain   PAD (peripheral artery disease), aortic-iliac bypass 1991, mild carotid bruits   OSA (obstructive sleep apnea)   Chronic fatigue syndrome with fibromyalgia   Accelerated hypertension   Ulcerative colitis (HCC)   Atrial fibrillation (HCC)   CKD (chronic kidney disease) stage 3, GFR 30-59 ml/min (HCC)   Elevated troponin   Hypokalemia   Hypomagnesemia   Intractable nausea and vomiting   Prolonged QT interval   Metabolic alkalosis   Principal problem Intractable nausea, vomiting -ongoing for the past 2 months, following her most recent hospitalization.  Apparently family has been working with Dr. Elnoria Howard as an outpatient to get this under control.  This is associated with a 10 pound weight loss, and now with profound  hypokalemia -GI consulted, Dr. Loreta Ave evaluated patient, and its likely multifactorial, potentially polypharmacy and side effects from that -Schedule antiemetics today.  Try to switch amlodipine to another antihypertensive agents due to poorly controlled hypertension but also to see if her nausea may be a side effect from amlodipine  Active problems Acute CVA -with reported intermittent slurred speech and occasional confusion over the last several days.  MRI done yesterday shows a small acute CVA.  Neurology consulted.  For now continue Eliquis -Workup showed an LDL of 80, A1c 5.6.  CT angiogram without LVO.  2D echo unremarkable  Troponin elevation -on admission, EKG showed some ST depressions in the inferolateral leads, cardiology was consulted as a code STEMI however this was canceled as it was not felt to be a STEMI.  Discussed with Dr. Jens Som, this is likely demand ischemia in the setting of acute illness.  She does not have any chest pain, and echocardiogram done during this admission is reassuringly normal, with normal EF and no wall motion abnormalities.  Profound hypokalemia -replete aggressively, improved today  Chronic pain, fibromyalgia -resume home hydrocodone  PAD, history of aortoiliac bypass 1991 -CT angiogram without acute findings  Ulcerative colitis - stable  PAF - continue metoprolol, eliquis  Scheduled Meds:   stroke: early stages of recovery book   Does not apply Once   apixaban  2.5 mg Oral BID   atorvastatin  40 mg Oral Daily   Chlorhexidine Gluconate Cloth  6 each Topical Daily   citalopram  20 mg Oral Daily   hydrALAZINE  25 mg Oral Q8H   isosorbide mononitrate  15 mg Oral Daily  metoprolol succinate  50 mg Oral Daily   ondansetron  4 mg Oral Q8H   Continuous Infusions:  methocarbamol (ROBAXIN) IV Stopped (02/05/23 0733)   promethazine (PHENERGAN) injection (IM or IVPB)     thiamine (VITAMIN B1) injection 500 mg (02/07/23 1134)   PRN Meds:.albuterol,  HYDROcodone-acetaminophen, HYDROmorphone (DILAUDID) injection, labetalol, LORazepam, methocarbamol (ROBAXIN) IV, promethazine (PHENERGAN) injection (IM or IVPB)  Current Outpatient Medications  Medication Instructions   amLODipine (NORVASC) 5 mg, Oral, Daily   apixaban (ELIQUIS) 2.5 mg, Oral, 2 times daily   atorvastatin (LIPITOR) 20 mg, Oral, Daily   b complex vitamins capsule 1 capsule, Oral, Daily   betamethasone dipropionate 0.05 % cream 1 Application, Topical, Daily   citalopram (CELEXA) 20 MG tablet 1 tablet, Oral, Daily   clobetasol ointment (TEMOVATE) 0.05 % 1 Application, Topical, 2 times daily   ezetimibe (ZETIA) 10 mg, Oral, Daily   furosemide (LASIX) 40 mg, Oral, 3 times weekly, Increase to every day as needed for weight gain of 2lbs overnight or 5lbs in 1 week   HYDROcodone-acetaminophen (NORCO/VICODIN) 5-325 MG tablet 1 tablet, Oral, 4 times daily PRN, May Take an extra 0.5 to one tablet when pain is severe.   isosorbide mononitrate (IMDUR) 15 mg, Oral, Daily   methocarbamol (ROBAXIN) 500 MG tablet TAKE 1 TABLET BY MOUTH EVERY 8 HOURS AS NEEDED FOR MUSCLE SPASM   metoprolol succinate (TOPROL-XL) 50 MG 24 hr tablet TAKE ONE TABLET BY MOUTH ONCE daily   Multiple Vitamin (MULITIVITAMIN WITH MINERALS) TABS 1 tablet, Oral, Daily,     ondansetron (ZOFRAN) 4 mg, Oral, Every 8 hours PRN   potassium chloride (KLOR-CON M) 10 MEQ tablet 10 mEq, Oral, 3 times weekly, And with extra lasix as needed.   zolpidem (AMBIEN CR) 12.5 mg, Oral, Daily at bedtime    Diet Orders (From admission, onward)     Start     Ordered   02/06/23 0906  DIET SOFT Room service appropriate? Yes; Fluid consistency: Thin  Diet effective now       Question Answer Comment  Room service appropriate? Yes   Fluid consistency: Thin      02/06/23 0905            DVT prophylaxis: apixaban (ELIQUIS) tablet 2.5 mg Start: 02/05/23 1000 SCDs Start: 02/05/23 0355 apixaban (ELIQUIS) tablet 2.5 mg   Lab Results   Component Value Date   PLT 214 02/07/2023      Code Status: DNR  Family Communication: Discussed with 2 daughters over the phone  Status is: Inpatient Remains inpatient appropriate because: severity of illness  Level of care: Stepdown  Consultants:  none  Objective: Vitals:   02/07/23 0500 02/07/23 0600 02/07/23 0800 02/07/23 0809  BP: (!) 160/61 (!) 180/92  (!) 178/81  Pulse: 81 80    Resp: 11 17    Temp:   97.9 F (36.6 C)   TempSrc:   Oral   SpO2: 100% 96%    Weight:      Height:        Intake/Output Summary (Last 24 hours) at 02/07/2023 1205 Last data filed at 02/07/2023 0600 Gross per 24 hour  Intake --  Output 440 ml  Net -440 ml    Wt Readings from Last 3 Encounters:  02/05/23 80.2 kg  01/23/23 84.3 kg  12/26/22 83.5 kg    Examination:  Constitutional: NAD Eyes: lids and conjunctivae normal, no scleral icterus ENMT: mmm Neck: normal, supple Respiratory: clear to auscultation bilaterally, no wheezing, no  crackles. Normal respiratory effort.  Cardiovascular: Regular rate and rhythm, no murmurs / rubs / gallops. No LE edema. Abdomen: soft, no distention, no tenderness. Bowel sounds positive.   Data Reviewed: I have independently reviewed following labs and imaging studies   CBC Recent Labs  Lab 02/04/23 2115 02/05/23 0309 02/05/23 0537 02/06/23 0259 02/07/23 0302  WBC 13.5* 14.4* 13.0* 14.3* 12.1*  HGB 14.1 12.2 11.5* 10.8* 10.3*  HCT 47.2* 40.2 38.5 36.7 35.4*  PLT 304 268 231 255 214  MCV 70.4* 70.7* 70.9* 73.1* 74.5*  MCH 21.0* 21.4* 21.2* 21.5* 21.7*  MCHC 29.9* 30.3 29.9* 29.4* 29.1*  RDW 19.2* 18.2* 18.4* 18.3* 18.4*  LYMPHSABS  --  1.6  --   --   --   MONOABS  --  0.8  --   --   --   EOSABS  --  0.0  --   --   --   BASOSABS  --  0.1  --   --   --      Recent Labs  Lab 02/04/23 2115 02/04/23 2125 02/04/23 2201 02/04/23 2202 02/05/23 0308 02/05/23 0310 02/05/23 0314 02/05/23 0537 02/05/23 1552 02/06/23 0259  02/06/23 1532 02/07/23 0302  NA 133*  --   --   --   --   --   --  131*  --  133*  --  135  K 2.1*  --   --   --   --   --   --  2.1* 2.9* 2.9* 3.7 3.5  CL 83*  --   --   --   --   --   --  85*  --  93*  --  96*  CO2 33*  --   --   --   --   --   --  34*  --  30  --  30  GLUCOSE 141*  --   --   --   --   --   --  150*  --  140*  --  115*  BUN 19  --   --   --   --   --   --  15  --  14  --  16  CREATININE 1.35*  --   --   --   --   --   --  1.06*  --  1.20*  --  1.03*  CALCIUM 9.7  --   --   --   --   --   --  8.3*  --  8.4*  --  8.7*  AST  --   --  28  --   --   --   --  25  --  46*  --   --   ALT  --   --  18  --   --   --   --  17  --  21  --   --   ALKPHOS  --   --  56  --   --   --   --  43  --  43  --   --   BILITOT  --   --  0.8  --   --   --   --  0.9  --  0.9  --   --   ALBUMIN  --   --  4.3  --   --   --   --  3.6  --  3.5  --   --  MG  --  1.5*  --   --   --   --   --  1.8  --  1.6*  --  2.2  LATICACIDVEN  --   --   --  3.2*  --   --  1.6  --   --   --   --   --   TSH  --   --   --   --   --  0.733  --   --   --   --   --   --   HGBA1C  --   --   --   --   --   --   --   --   --  5.6  --   --   AMMONIA  --   --   --   --  16  --   --   --   --   --   --   --      ------------------------------------------------------------------------------------------------------------------ Recent Labs    02/05/23 0310 02/06/23 0259  CHOL 183 144  HDL 53 46  LDLCALC 111* 80  TRIG 94 92  CHOLHDL 3.5 3.1     Lab Results  Component Value Date   HGBA1C 5.6 02/06/2023   ------------------------------------------------------------------------------------------------------------------ Recent Labs    02/05/23 0310  TSH 0.733     Cardiac Enzymes No results for input(s): "CKMB", "TROPONINI", "MYOGLOBIN" in the last 168 hours.  Invalid input(s): "CK" ------------------------------------------------------------------------------------------------------------------    Component  Value Date/Time   BNP 96.8 10/23/2021 1417   BNP 263.1 (H) 02/26/2021 1314    CBG: Recent Labs  Lab 02/04/23 2151 02/05/23 0445 02/05/23 0915  GLUCAP 166* 142* 158*     Recent Results (from the past 240 hour(s))  SARS Coronavirus 2 by RT PCR (hospital order, performed in [facility] hospital lab) *cepheid single result test* Anterior Nasal Swab     Status: None   Collection Time: 02/05/23  2:44 AM   Specimen: Anterior Nasal Swab  Result Value Ref Range Status   SARS Coronavirus 2 by RT PCR NEGATIVE NEGATIVE Final    Comment: (NOTE) SARS-CoV-2 target nucleic acids are NOT DETECTED.  The SARS-CoV-2 RNA is generally detectable in upper and lower respiratory specimens during the acute phase of infection. The lowest concentration of SARS-CoV-2 viral copies this assay can detect is 250 copies / mL. A negative result does not preclude SARS-CoV-2 infection and should not be used as the sole basis for treatment or other patient management decisions.  A negative result may occur with improper specimen collection / handling, submission of specimen other than nasopharyngeal swab, presence of viral mutation(s) within the areas targeted by this assay, and inadequate number of viral copies (<250 copies / mL). A negative result must be combined with clinical observations, patient history, and epidemiological information.  Fact Sheet for Patients:   RoadLapTop.co.za  Fact Sheet for Healthcare Providers: http://kim-miller.com/  This test is not yet approved or  cleared by the [place] FDA and has been authorized for detection and/or diagnosis of SARS-CoV-2 by FDA under an Emergency Use Authorization (EUA).  This EUA will remain in effect (meaning this test can be used) for the duration of the COVID-19 declaration under Section 564(b)(1) of the Act, 21 U.S.C. section 360bbb-3(b)(1), unless the authorization is terminated or revoked  sooner.  Performed at [facility], [address], [place], [place] [address]      Radiology Studies: EEG adult  Result Date: 02/06/2023 Charlsie Quest, MD     02/06/2023  5:09 PM Patient Name: Kara Bush MRN: [id number] Epilepsy Attending: Charlsie Quest Referring Physician/Provider: Therisa Doyne, MD Date: 02/06/2023 Duration: 30.57 mins Patient history: 82yo F with ams getting eeg to evaluate for seizure. Level of alertness: Awake, asleep AEDs during EEG study: Ativan Technical aspects: This EEG study was done with scalp electrodes positioned according to the 10-20 International system of electrode placement. Electrical activity was reviewed with band pass filter of 1-70Hz , sensitivity of 7 uV/mm, display speed of 44mm/sec with a 60Hz  notched filter applied as appropriate. EEG data were recorded continuously and digitally stored.  Video monitoring was available and reviewed as appropriate. Description: The posterior dominant rhythm consists of 8-9 Hz activity of moderate voltage (25-35 uV) seen predominantly in posterior head regions, symmetric and reactive to eye opening and eye closing. Sleep was characterized by vertex waves, sleep spindles (12 to 14 Hz), maximal frontocentral region. EEG showed intermittent generalized polymorphic 3 to 6 Hz theta-delta slowing. Hyperventilation and photic stimulation were not performed.   ABNORMALITY - Intermittent slow, generalized IMPRESSION: This study is suggestive of mild diffuse encephalopathy, nonspecific etiology. No seizures or epileptiform discharges were seen throughout the recording. Priyanka Thelma Comp, MD, PhD [facility]  Between 7 am - 7 pm I am available, please contact me via Amion (for emergencies) or Securechat (non urgent messages)  Between 7 pm - 7 am I am not available, please contact night coverage MD/APP via Amion

## 2023-02-08 ENCOUNTER — Inpatient Hospital Stay (HOSPITAL_COMMUNITY): Payer: PPO

## 2023-02-08 DIAGNOSIS — G9340 Encephalopathy, unspecified: Secondary | ICD-10-CM | POA: Diagnosis not present

## 2023-02-08 LAB — BASIC METABOLIC PANEL
Anion gap: 10 (ref 5–15)
BUN: 19 mg/dL (ref 8–23)
CO2: 27 mmol/L (ref 22–32)
Calcium: 8.7 mg/dL — ABNORMAL LOW (ref 8.9–10.3)
Chloride: 96 mmol/L — ABNORMAL LOW (ref 98–111)
Creatinine, Ser: 1.02 mg/dL — ABNORMAL HIGH (ref 0.44–1.00)
GFR, Estimated: 55 mL/min — ABNORMAL LOW (ref >60)
Glucose, Bld: 118 mg/dL — ABNORMAL HIGH (ref 70–99)
Potassium: 3.7 mmol/L (ref 3.5–5.1)
Sodium: 133 mmol/L — ABNORMAL LOW (ref 135–145)

## 2023-02-08 LAB — URINALYSIS, ROUTINE W REFLEX MICROSCOPIC
Bilirubin Urine: NEGATIVE
Glucose, UA: NEGATIVE mg/dL
Ketones, ur: NEGATIVE mg/dL
Nitrite: NEGATIVE
Protein, ur: NEGATIVE mg/dL
Specific Gravity, Urine: 1.006 (ref 1.005–1.030)
pH: 6 (ref 5.0–8.0)

## 2023-02-08 LAB — MAGNESIUM: Magnesium: 1.8 mg/dL (ref 1.7–2.4)

## 2023-02-08 MED ORDER — LORAZEPAM 2 MG/ML IJ SOLN: 0.5000 mg | Freq: Two times a day (BID) | INTRAMUSCULAR | Status: DC | PRN

## 2023-02-08 MED ORDER — FUROSEMIDE 10 MG/ML IJ SOLN
40.0000 mg | Freq: Once | INTRAMUSCULAR | Status: AC
  Administered 2023-02-08: 40 mg via INTRAVENOUS
  Filled 2023-02-08: qty 4

## 2023-02-08 MED ORDER — HYDROCODONE-ACETAMINOPHEN 5-325 MG PO TABS
1.0000 | ORAL_TABLET | Freq: Four times a day (QID) | ORAL | Status: DC | PRN
  Administered 2023-02-08 – 2023-02-09 (×2): 1 via ORAL
  Filled 2023-02-08 (×2): qty 1

## 2023-02-08 MED ORDER — METOPROLOL TARTRATE 5 MG/5ML IV SOLN
INTRAVENOUS | Status: AC
  Filled 2023-02-08: qty 5

## 2023-02-08 MED ORDER — ACETAMINOPHEN 325 MG PO TABS
325.0000 mg | ORAL_TABLET | Freq: Once | ORAL | Status: AC | PRN
  Administered 2023-02-08: 325 mg via ORAL

## 2023-02-08 MED ORDER — METOPROLOL TARTRATE 5 MG/5ML IV SOLN
5.0000 mg | INTRAVENOUS | Status: DC | PRN
  Administered 2023-02-08: 5 mg via INTRAVENOUS

## 2023-02-08 MED ORDER — METOPROLOL SUCCINATE ER 25 MG PO TB24
25.0000 mg | ORAL_TABLET | Freq: Once | ORAL | Status: AC
  Administered 2023-02-08: 25 mg via ORAL
  Filled 2023-02-08: qty 1

## 2023-02-08 MED ORDER — METOPROLOL SUCCINATE ER 50 MG PO TB24
75.0000 mg | ORAL_TABLET | Freq: Every day | ORAL | Status: DC
  Administered 2023-02-09 – 2023-02-11 (×3): 75 mg via ORAL
  Filled 2023-02-08 (×4): qty 1

## 2023-02-08 MED ORDER — MELATONIN 3 MG PO TABS
3.0000 mg | ORAL_TABLET | Freq: Once | ORAL | Status: AC
  Administered 2023-02-08: 3 mg via ORAL
  Filled 2023-02-08: qty 1

## 2023-02-08 NOTE — Plan of Care (Signed)
  Problem: Education: Goal: Knowledge of General Education information will improve Description: Including pain rating scale, medication(s)/side effects and non-pharmacologic comfort measures Outcome: Progressing   Problem: Activity: Goal: Risk for activity intolerance will decrease Outcome: Progressing   Problem: Coping: Goal: Level of anxiety will decrease Outcome: Progressing   Problem: Elimination: Goal: Will not experience complications related to bowel motility Outcome: Progressing Goal: Will not experience complications related to urinary retention Outcome: Progressing   Problem: Pain Managment: Goal: General experience of comfort will improve Outcome: Progressing   Problem: Safety: Goal: Ability to remain free from injury will improve Outcome: Progressing   Problem: Skin Integrity: Goal: Risk for impaired skin integrity will decrease Outcome: Progressing   Problem: Education: Goal: Knowledge of disease or condition will improve Outcome: Progressing   Problem: Ischemic Stroke/TIA Tissue Perfusion: Goal: Complications of ischemic stroke/TIA will be minimized Outcome: Progressing   Problem: Nutrition: Goal: Risk of aspiration will decrease Outcome: Progressing

## 2023-02-08 NOTE — Progress Notes (Signed)
Kara Bush has been pretty sleepy today, she was asleep everytime I checked on her and is asleep in the chair now. She received Dilaudid x2, Ativanx2, Norco x1 last night. We did give her Phenergan this am. Family is concerned that [name] is too sleepy today and they are stating that she often takes her pain meds to prevent pain, rather than to treat it.

## 2023-02-08 NOTE — Progress Notes (Signed)
PROGRESS NOTE  Kara Bush UJW:[id number] DOB: [birth year] DOA: 02/04/2023 PCP: Jackelyn Poling, DO   LOS: 3 days   Brief Narrative / Interim history: 82 year old female with history of ulcerative colitis, CKD, ascending thoracic aneurysm comes into the hospital with confusion, nausea, vomiting.  Family tells me that she has been hospitalized in April for acute blood loss anemia requiring blood transfusions, underwent an EGD which showed hiatal hernia, and also a colonoscopy which showed stable UC comes to the hospital with progressive nausea over the last 2 months.  Since she was discharged she has been having progressive nausea, difficulties with poor p.o. intake, and an associated 10 pound weight loss.  Daughter also reports intermittent diarrhea but none currently.  Family also states that she has been more confused over the last 2 months, especially in the last several days has been having intermittent slurred speech and difficulty finding words  Subjective / 24h Interval events: She was feeling much better.  No significant nausea  Assesement and Plan: Principal Problem:   Acute encephalopathy Active Problems:   Chest wall pain   PAD (peripheral artery disease), aortic-iliac bypass 1991, mild carotid bruits   OSA (obstructive sleep apnea)   Chronic fatigue syndrome with fibromyalgia   Accelerated hypertension   Ulcerative colitis (HCC)   Atrial fibrillation (HCC)   CKD (chronic kidney disease) stage 3, GFR 30-59 ml/min (HCC)   Elevated troponin   Hypokalemia   Hypomagnesemia   Intractable nausea and vomiting   Prolonged QT interval   Metabolic alkalosis   Principal problem Intractable nausea, vomiting -ongoing for the past 2 months, following her most recent hospitalization.  Apparently family has been working with Dr. Elnoria Howard as an outpatient to get this under control.  This is associated with a 10 pound weight loss, and now with profound hypokalemia -GI consulted, Dr. Loreta Ave  evaluated patient, and its likely multifactorial, potentially polypharmacy and side effects from that -Continue scheduled antiemetics.  Amlodipine switched to hydralazine to minimize side effects  Active problems Acute CVA -with reported intermittent slurred speech and occasional confusion over the last several days.  MRI done yesterday shows a small acute CVA.  Neurology consulted.  For now continue Eliquis -Workup showed an LDL of 80, A1c 5.6.  CT angiogram without LVO.  2D echo unremarkable  Troponin elevation -on admission, EKG showed some ST depressions in the inferolateral leads, cardiology was consulted as a code STEMI however this was canceled as it was not felt to be a STEMI.  Discussed with Dr. Jens Som, this is likely demand ischemia in the setting of acute illness.  She does not have any chest pain, and echocardiogram done during this admission is reassuringly normal, with normal EF and no wall motion abnormalities.  Profound hypokalemia -potassium improved.  Magnesium 1.8  Chronic pain, fibromyalgia -continue home hydrocodone  PAD, history of aortoiliac bypass 1991 -CT angiogram without acute findings  Ulcerative colitis - stable  PAF - continue metoprolol, eliquis  Scheduled Meds:   stroke: early stages of recovery book   Does not apply Once   apixaban  2.5 mg Oral BID   atorvastatin  40 mg Oral Daily   Chlorhexidine Gluconate Cloth  6 each Topical Daily   citalopram  20 mg Oral Daily   hydrALAZINE  25 mg Oral Q8H   isosorbide mononitrate  15 mg Oral Daily   metoprolol succinate  50 mg Oral Daily   ondansetron  4 mg Oral Q8H   Continuous Infusions:  methocarbamol (ROBAXIN)  IV Stopped (02/07/23 2229)   promethazine (PHENERGAN) injection (IM or IVPB) 200 mL/hr at 02/08/23 0900   thiamine (VITAMIN B1) injection Stopped (02/07/23 1204)   PRN Meds:.albuterol, HYDROcodone-acetaminophen, HYDROmorphone (DILAUDID) injection, labetalol, LORazepam, methocarbamol (ROBAXIN) IV,  promethazine (PHENERGAN) injection (IM or IVPB)  Current Outpatient Medications  Medication Instructions   amLODipine (NORVASC) 5 mg, Oral, Daily   apixaban (ELIQUIS) 2.5 mg, Oral, 2 times daily   atorvastatin (LIPITOR) 20 mg, Oral, Daily   b complex vitamins capsule 1 capsule, Oral, Daily   betamethasone dipropionate 0.05 % cream 1 Application, Daily   citalopram (CELEXA) 20 MG tablet 1 tablet, Daily   clobetasol ointment (TEMOVATE) 0.05 % 1 Application, 2 times daily   ezetimibe (ZETIA) 10 mg, Oral, Daily   ferrous sulfate 325 mg, Daily   furosemide (LASIX) 40 mg, Oral, 3 times weekly, Increase to every day as needed for weight gain of 2lbs overnight or 5lbs in 1 week   HYDROcodone-acetaminophen (NORCO/VICODIN) 5-325 MG tablet 1 tablet, Oral, 4 times daily PRN, May Take an extra 0.5 to one tablet when pain is severe.   isosorbide mononitrate (IMDUR) 15 mg, Oral, Daily   methocarbamol (ROBAXIN) 500 MG tablet TAKE 1 TABLET BY MOUTH EVERY 8 HOURS AS NEEDED FOR MUSCLE SPASM   metoprolol succinate (TOPROL-XL) 50 MG 24 hr tablet TAKE ONE TABLET BY MOUTH ONCE daily   Multiple Vitamin (MULITIVITAMIN WITH MINERALS) TABS 1 tablet, Oral, Daily,     ondansetron (ZOFRAN) 4 mg, Oral, Every 8 hours PRN   potassium chloride (KLOR-CON M) 10 MEQ tablet 10 mEq, Oral, 3 times weekly, And with extra lasix as needed.   sacubitril-valsartan (ENTRESTO) 97-103 MG 1 tablet, 2 times daily   zolpidem (AMBIEN CR) 12.5 mg, Oral, Daily at bedtime    Diet Orders (From admission, onward)     Start     Ordered   02/06/23 0906  DIET SOFT Room service appropriate? Yes; Fluid consistency: Thin  Diet effective now       Question Answer Comment  Room service appropriate? Yes   Fluid consistency: Thin      02/06/23 0905            DVT prophylaxis: apixaban (ELIQUIS) tablet 2.5 mg Start: 02/05/23 1000 SCDs Start: 02/05/23 0355 apixaban (ELIQUIS) tablet 2.5 mg   Lab Results  Component Value Date   PLT 214  02/07/2023      Code Status: DNR  Family Communication: No family at bedside this morning  Status is: Inpatient Remains inpatient appropriate because: severity of illness  Level of care: Progressive  Consultants:  none  Objective: Vitals:   02/08/23 0600 02/08/23 0700 02/08/23 0800 02/08/23 0812  BP: 96/67 133/74 (!) 161/59   Pulse: (!) 106 97 89 86  Resp: 18 (!) 21 17 (!) 23  Temp:      TempSrc:      SpO2: (!) 87% 94% 96% 96%  Weight:      Height:        Intake/Output Summary (Last 24 hours) at 02/08/2023 0953 Last data filed at 02/08/2023 0900 Gross per 24 hour  Intake 308.37 ml  Output 150 ml  Net 158.37 ml    Wt Readings from Last 3 Encounters:  02/05/23 80.2 kg  01/23/23 84.3 kg  12/26/22 83.5 kg    Examination:  Constitutional: NAD Eyes: lids and conjunctivae normal, no scleral icterus ENMT: mmm Neck: normal, supple Respiratory: clear to auscultation bilaterally, no wheezing, no crackles. Normal respiratory effort.  Cardiovascular:  Regular rate and rhythm, no murmurs / rubs / gallops. No LE edema. Abdomen: soft, no distention, no tenderness. Bowel sounds positive.  Skin: no rashes  Data Reviewed: I have independently reviewed following labs and imaging studies   CBC Recent Labs  Lab 02/04/23 2115 02/05/23 0309 02/05/23 0537 02/06/23 0259 02/07/23 0302  WBC 13.5* 14.4* 13.0* 14.3* 12.1*  HGB 14.1 12.2 11.5* 10.8* 10.3*  HCT 47.2* 40.2 38.5 36.7 35.4*  PLT 304 268 231 255 214  MCV 70.4* 70.7* 70.9* 73.1* 74.5*  MCH 21.0* 21.4* 21.2* 21.5* 21.7*  MCHC 29.9* 30.3 29.9* 29.4* 29.1*  RDW 19.2* 18.2* 18.4* 18.3* 18.4*  LYMPHSABS  --  1.6  --   --   --   MONOABS  --  0.8  --   --   --   EOSABS  --  0.0  --   --   --   BASOSABS  --  0.1  --   --   --      Recent Labs  Lab 02/04/23 2115 02/04/23 2125 02/04/23 2201 02/04/23 2202 02/05/23 0308 02/05/23 0310 02/05/23 0314 02/05/23 0537 02/05/23 1552 02/06/23 0259 02/06/23 1532  02/07/23 0302 02/08/23 0259  NA 133*  --   --   --   --   --   --  131*  --  133*  --  135 133*  K 2.1*  --   --   --   --   --   --  2.1* 2.9* 2.9* 3.7 3.5 3.7  CL 83*  --   --   --   --   --   --  85*  --  93*  --  96* 96*  CO2 33*  --   --   --   --   --   --  34*  --  30  --  30 27  GLUCOSE 141*  --   --   --   --   --   --  150*  --  140*  --  115* 118*  BUN 19  --   --   --   --   --   --  15  --  14  --  16 19  CREATININE 1.35*  --   --   --   --   --   --  1.06*  --  1.20*  --  1.03* 1.02*  CALCIUM 9.7  --   --   --   --   --   --  8.3*  --  8.4*  --  8.7* 8.7*  AST  --   --  28  --   --   --   --  25  --  46*  --   --   --   ALT  --   --  18  --   --   --   --  17  --  21  --   --   --   ALKPHOS  --   --  56  --   --   --   --  43  --  43  --   --   --   BILITOT  --   --  0.8  --   --   --   --  0.9  --  0.9  --   --   --   ALBUMIN  --   --  4.3  --   --   --   --  3.6  --  3.5  --   --   --   MG  --  1.5*  --   --   --   --   --  1.8  --  1.6*  --  2.2 1.8  LATICACIDVEN  --   --   --  3.2*  --   --  1.6  --   --   --   --   --   --   TSH  --   --   --   --   --  0.733  --   --   --   --   --   --   --   HGBA1C  --   --   --   --   --   --   --   --   --  5.6  --   --   --   AMMONIA  --   --   --   --  16  --   --   --   --   --   --   --   --      ------------------------------------------------------------------------------------------------------------------ Recent Labs    02/06/23 0259  CHOL 144  HDL 46  LDLCALC 80  TRIG 92  CHOLHDL 3.1     Lab Results  Component Value Date   HGBA1C 5.6 02/06/2023   ------------------------------------------------------------------------------------------------------------------ No results for input(s): "TSH", "T4TOTAL", "T3FREE", "THYROIDAB" in the last 72 hours.  Invalid input(s): "FREET3"   Cardiac Enzymes No results for input(s): "CKMB", "TROPONINI", "MYOGLOBIN" in the last 168 hours.  Invalid input(s):  "CK" ------------------------------------------------------------------------------------------------------------------    Component Value Date/Time   BNP 96.8 10/23/2021 1417   BNP 263.1 (H) 02/26/2021 1314    CBG: Recent Labs  Lab 02/04/23 2151 02/05/23 0445 02/05/23 0915  GLUCAP 166* 142* 158*     Recent Results (from the past 240 hour(s))  SARS Coronavirus 2 by RT PCR (hospital order, performed in [facility] hospital lab) *cepheid single result test* Anterior Nasal Swab     Status: None   Collection Time: 02/05/23  2:44 AM   Specimen: Anterior Nasal Swab  Result Value Ref Range Status   SARS Coronavirus 2 by RT PCR NEGATIVE NEGATIVE Final    Comment: (NOTE) SARS-CoV-2 target nucleic acids are NOT DETECTED.  The SARS-CoV-2 RNA is generally detectable in upper and lower respiratory specimens during the acute phase of infection. The lowest concentration of SARS-CoV-2 viral copies this assay can detect is 250 copies / mL. A negative result does not preclude SARS-CoV-2 infection and should not be used as the sole basis for treatment or other patient management decisions.  A negative result may occur with improper specimen collection / handling, submission of specimen other than nasopharyngeal swab, presence of viral mutation(s) within the areas targeted by this assay, and inadequate number of viral copies (<250 copies / mL). A negative result must be combined with clinical observations, patient history, and epidemiological information.  Fact Sheet for Patients:   RoadLapTop.co.za  Fact Sheet for Healthcare Providers: http://kim-miller.com/  This test is not yet approved or  cleared by the [place] FDA and has been authorized for detection and/or diagnosis of SARS-CoV-2 by FDA under an Emergency Use Authorization (EUA).  This EUA will remain in effect (meaning this test can be used) for the duration of the COVID-19  declaration under Section 564(b)(1) of the Act, 21 U.S.C. section 360bbb-3(b)(1), unless the authorization is terminated  or revoked sooner.  Performed at [facility], [address], [place], [place] [address]      Radiology Studies: No results found.   Pamella Pert, MD, PhD [facility]  Between 7 am - 7 pm I am available, please contact me via Amion (for emergencies) or Securechat (non urgent messages)  Between 7 pm - 7 am I am not available, please contact night coverage MD/APP via Amion

## 2023-02-08 NOTE — Progress Notes (Signed)
OT Cancellation Note  Patient Details Name: Kara Bush MRN: [id number] DOB: 1940-12-30   Cancelled Treatment:    Reason Eval/Treat Not Completed: Patient not medically ready Patient in recliner with family present HR increased to 130s. OT to continue to follow and check back as schedule will allow Rosalio Loud, MS Acute Rehabilitation Department Office# [phone] 02/08/2023, 1:04 PM

## 2023-02-08 NOTE — Progress Notes (Signed)
Notified MD that patient's daughters were concerned about patient's sleepiness and worried patient might get dehydrated. MD put in new orders for patient to get a Head CT and obtain a urinalysis with reflex microscopic to check and see if anything has changed. MD did not want to give patient IV fluids due to her shortness of breath with the fluids and had to be diuresed with Lasix. Will continue to monitor patient.

## 2023-02-09 ENCOUNTER — Inpatient Hospital Stay (HOSPITAL_COMMUNITY): Payer: PPO

## 2023-02-09 DIAGNOSIS — G9340 Encephalopathy, unspecified: Secondary | ICD-10-CM | POA: Diagnosis not present

## 2023-02-09 LAB — BASIC METABOLIC PANEL
Anion gap: 8 (ref 5–15)
BUN: 18 mg/dL (ref 8–23)
CO2: 30 mmol/L (ref 22–32)
Calcium: 8.7 mg/dL — ABNORMAL LOW (ref 8.9–10.3)
Chloride: 97 mmol/L — ABNORMAL LOW (ref 98–111)
Creatinine, Ser: 1.1 mg/dL — ABNORMAL HIGH (ref 0.44–1.00)
GFR, Estimated: 50 mL/min — ABNORMAL LOW (ref >60)
Glucose, Bld: 115 mg/dL — ABNORMAL HIGH (ref 70–99)
Potassium: 3.2 mmol/L — ABNORMAL LOW (ref 3.5–5.1)
Sodium: 135 mmol/L (ref 135–145)

## 2023-02-09 LAB — VITAMIN B1: Vitamin B1 (Thiamine): 200.2 nmol/L — ABNORMAL HIGH (ref 66.5–200.0)

## 2023-02-09 LAB — MAGNESIUM: Magnesium: 1.7 mg/dL (ref 1.7–2.4)

## 2023-02-09 MED ORDER — BOOST / RESOURCE BREEZE PO LIQD CUSTOM
1.0000 | Freq: Two times a day (BID) | ORAL | Status: DC
  Administered 2023-02-09 – 2023-02-13 (×10): 1 via ORAL

## 2023-02-09 MED ORDER — POTASSIUM CHLORIDE CRYS ER 20 MEQ PO TBCR
40.0000 meq | EXTENDED_RELEASE_TABLET | Freq: Once | ORAL | Status: AC
  Administered 2023-02-09: 40 meq via ORAL
  Filled 2023-02-09: qty 2

## 2023-02-09 MED ORDER — MAGNESIUM SULFATE 2 GM/50ML IV SOLN
2.0000 g | Freq: Once | INTRAVENOUS | Status: AC
  Administered 2023-02-09: 2 g via INTRAVENOUS
  Filled 2023-02-09: qty 50

## 2023-02-09 MED ORDER — ACETAMINOPHEN 325 MG PO TABS
650.0000 mg | ORAL_TABLET | Freq: Four times a day (QID) | ORAL | Status: DC | PRN
  Administered 2023-02-09 – 2023-02-13 (×7): 650 mg via ORAL
  Filled 2023-02-09 (×9): qty 2

## 2023-02-09 MED ORDER — HYDROCODONE-ACETAMINOPHEN 5-325 MG PO TABS
1.0000 | ORAL_TABLET | ORAL | Status: DC | PRN
  Administered 2023-02-09 – 2023-02-14 (×24): 1 via ORAL
  Filled 2023-02-09 (×26): qty 1

## 2023-02-09 NOTE — Evaluation (Signed)
Speech Language Pathology Evaluation Patient Details Name: Kara Bush MRN: [id number] DOB: 08-13-1940 Today's Date: 02/09/2023 Time: 1205-1225 SLP Time Calculation (min) (ACUTE ONLY): 20 min  Problem List:  Patient Active Problem List   Diagnosis Date Noted   Acute encephalopathy 02/05/2023   Elevated troponin 02/05/2023   Hypokalemia 02/05/2023   Hypomagnesemia 02/05/2023   Intractable nausea and vomiting 02/05/2023   Prolonged QT interval 02/05/2023   Metabolic alkalosis 02/05/2023   Demand ischemia 11/24/2022   Chest pain 11/23/2022   Non-ST elevation (NSTEMI) myocardial infarction (HCC) 11/23/2022   Acute gastrointestinal bleeding 11/23/2022   Chronic combined systolic (congestive) and diastolic (congestive) heart failure (HCC) 11/23/2022   CKD (chronic kidney disease) stage 3, GFR 30-59 ml/min (HCC) 11/23/2022   DNR (do not resuscitate) 11/23/2022   A-fib (HCC) 02/26/2021   Atrial fibrillation (HCC) 09/09/2018   DOE (dyspnea on exertion) 08/10/2017   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   Greater trochanteric bursitis of right hip 11/07/2014   Sacro-iliac pain 11/07/2014   Chronic midline low back pain without sciatica 11/07/2013   Hepatitis 04/20/2013   Screening for STD (sexually transmitted disease) 04/20/2013   Recurrent UTI 04/20/2013   Abdominal aortic aneurysm (HCC)    Ulcerative colitis (HCC)    Obstructive sleep apnea    Fibromyalgia    Coronary artery disease    Basal cell carcinoma    Symptomatic anemia 01/11/2013   Ascending aortic aneurysm, 4.1 cm 01/11/2013   Cervical spondylolysis 10/15/2011   Chronic periscapular pain 10/15/2011   Chest wall pain    S/P resection of aortic aneurysm, 1998 prior to CABG    PAD (peripheral artery disease), aortic-iliac bypass 1991, mild carotid bruits    OSA (obstructive sleep apnea)    Chronic fatigue syndrome with fibromyalgia    Hyperlipidemia    Ulcerative colitis    Depression    CAD  (coronary artery disease), CABG 1998, STENTS MULTIPLE SITES SINCE.     Accelerated hypertension    Past Medical History:  Past Medical History:  Diagnosis Date   Abdominal aortic aneurysm (HCC)    Anemia, improved 01/11/2013   Basal cell carcinoma    CAD (coronary artery disease)    hx CABG 1998, last cath 2007 with PCI and stenting to the proximal segment of the vein graft to the diagonal branch. DES Taxus was placed   Cervicalgia    Chronic fatigue fibromyalgia syndrome    Chronic pain syndrome    Depression    Fibromyalgia    History of nuclear stress test 08/07/2011   lexiscan; no evidence of ischemia or infarct; low risk    Hyperlipidemia    Hypertension    OSA (obstructive sleep apnea)    PAD (peripheral artery disease) (HCC)    Pes anserine bursitis    Pulmonary embolism (HCC) 01/11/2013   S/P resection of aortic aneurysm    Trochanteric bursitis    Ulcerative colitis [facility])    Past Surgical History:  Past Surgical History:  Procedure Laterality Date   AORTOBIEXTERNAL ILIAC BYPASS  1991   BIOPSY  11/25/2022   Procedure: BIOPSY;  Surgeon: Jeani Hawking, MD;  Location: [facility] ENDOSCOPY;  Service: Gastroenterology;;   CATARACT EXTRACTION Left 09/2019   CHOLECYSTECTOMY  1997   COLONOSCOPY WITH PROPOFOL N/A 11/25/2022   Procedure: COLONOSCOPY WITH PROPOFOL;  Surgeon: Jeani Hawking, MD;  Location: [facility] ENDOSCOPY;  Service: Gastroenterology;  Laterality: N/A;   CORONARY ANGIOPLASTY WITH STENT PLACEMENT  2006   to LAD, ATRETIC LIMA  AT THAT TIME ALSO   CORONARY ANGIOPLASTY WITH STENT PLACEMENT  2007   STENT TO PROX VG-DIAG, OTHER STENTS PATENT   CORONARY ARTERY BYPASS GRAFT  1998   LIMA-LAD;VG-DIAG & RCA   CORONARY STENT PLACEMENT  2005   TO LAD & LCX-STENTS,   ESOPHAGOGASTRODUODENOSCOPY (EGD) WITH PROPOFOL N/A 11/25/2022   Procedure: ESOPHAGOGASTRODUODENOSCOPY (EGD) WITH PROPOFOL;  Surgeon: Jeani Hawking, MD;  Location: [facility] ENDOSCOPY;  Service: Gastroenterology;  Laterality: N/A;    HERNIA REPAIR     TUBAL LIGATION     BTL   HPI:  Kara Bush is a 82 y.o. female with medical history significant of anemia, ulcerative colitis   CKD fusiform aneurysm of ascending thoracic aorta      Presented with  confusion, nausea and vomiting on 02/04/23. MRI revealed on 02/05/23 Small acute infarct in the left parietal subcortical white matter.ST consulted for speech/language evaluation d/t decreased cognition/confusion and new CVA.   Assessment / Plan / Recommendation Clinical Impression  Pt seen for speech/language assessment with [employer] Mental Status Examination (SLUMS) administered and a total score of 27/30 acquired which falls within the normal range.  Pt exhibited decreased short-term memory for recalling objects (60% gained without cues provided) and intermittent word finding/anomia during conversational tasks.  Auditory comprehension appeared [facility].  Slight flat affect observed, but otherwise, pragmatics appear adequate.  Simple problem solving, sustained attention tasks all WFL.  Speech intelligible, but deliberate intermittently during conversation.  Recommend ST f/u with HH if family/pt in agreement to assess cognition further if symptoms persist.  No further acute ST needs.  Thank you for this consult.    SLP Assessment  SLP Recommendation/Assessment: All further Speech Lanaguage Pathology  needs can be addressed in the next venue of care SLP Visit Diagnosis: Cognitive communication deficit (R41.841)    Recommendations for follow up therapy are one component of a multi-disciplinary discharge planning process, led by the attending physician.  Recommendations may be updated based on patient status, additional functional criteria and insurance authorization.    Follow Up Recommendations  Home health SLP (if pt/family agree prn if symptoms persist)    Assistance Recommended at Discharge  PRN  Functional Status Assessment Patient has had a recent decline in their  functional status and demonstrates the ability to make significant improvements in function in a reasonable and predictable amount of time.  Frequency and Duration  (evaluation only)         SLP Evaluation Cognition  Overall Cognitive Status: No family/caregiver present to determine baseline cognitive functioning Arousal/Alertness: Awake/alert Orientation Level: Oriented X4 Year: 2024 Month: July Day of Week: Correct Attention: Sustained Sustained Attention: Appears intact Memory: Impaired Memory Impairment: Decreased short term memory;Decreased recall of new information;Retrieval deficit Decreased Short Term Memory: Verbal basic;Functional basic Awareness: Appears intact Problem Solving: Appears intact Safety/Judgment: Appears intact       Comprehension  Auditory Comprehension Overall Auditory Comprehension: Appears within functional limits for tasks assessed Yes/No Questions: Within Functional Limits Commands: Within Functional Limits Conversation: Simple Interfering Components: Hearing;Working memory EffectiveTechniques: Repetition;Increased volume Visual Recognition/Discrimination Discrimination: Within Function Limits Reading Comprehension Reading Status: Within funtional limits    Expression Expression Primary Mode of Expression: Verbal Verbal Expression Overall Verbal Expression: Impaired Level of Generative/Spontaneous Verbalization: Conversation Naming: Impairment Responsive: 76-100% accurate Confrontation: Not tested Convergent: 75-100% accurate Divergent: 75-100% accurate Other Naming Comments: intermittent anomia during conversation; thought organization vs anomia Verbal Errors: Aware of errors Pragmatics: No impairment Non-Verbal Means of Communication: Not applicable Written Expression  Dominant Hand: Right Written Expression: Within Functional Limits   Oral / Motor  Oral Motor/Sensory Function Overall Oral Motor/Sensory Function: Within functional  limits Motor Speech Overall Motor Speech: Appears within functional limits for tasks assessed Respiration: Within functional limits Phonation: Normal Resonance: Within functional limits Articulation: Within functional limitis Intelligibility: Intelligible Motor Planning: Witnin functional limits Motor Speech Errors: Not applicable            Pat Sani Loiseau,M.S., CCC-SLP 02/09/2023, 1:12 PM

## 2023-02-09 NOTE — Progress Notes (Signed)
Occupational Therapy Treatment Patient Details Name: Kara Bush MRN: [id number] DOB: [birth year] Today's Date: 02/09/2023   History of present illness Patient is a 82 year old female who presented with poor oral intake, progressive nausea, increased confusion, and intermittent slurred speech. Patient was admitted with acute CVA, PMH: fibromyalgia, AAA, CAD s/p CABG, chronic fatigue/chronic pain, HLD, HTN.   OT comments  Patient was noted to make progress  towards goals. Patient was able to engage in grooming tasks standing at sink with no LOB or AD. HR noted to range from 100 to 126 bpm with activity. Patient was able to maintain O2 on RA during session in 90s. Patient's discharge plan remains appropriate at this time. OT will continue to follow acutely.     Recommendations for follow up therapy are one component of a multi-disciplinary discharge planning process, led by the attending physician.  Recommendations may be updated based on patient status, additional functional criteria and insurance authorization.    Assistance Recommended at Discharge Frequent or constant Supervision/Assistance  Patient can return home with the following  A little help with walking and/or transfers;A lot of help with bathing/dressing/bathroom;Assistance with cooking/housework;Direct supervision/assist for medications management;Assist for transportation;Help with stairs or ramp for entrance;Direct supervision/assist for financial management   Equipment Recommendations  None recommended by OT       Precautions / Restrictions Precautions Precautions: Fall Precaution Comments: monitor O2 Restrictions Weight Bearing Restrictions: No       Mobility Bed Mobility               General bed mobility comments: patient was up in recliner and returned to the same       Balance Overall balance assessment: Mild deficits observed, not formally tested           ADL either performed or assessed  with clinical judgement   ADL Overall ADL's : Needs assistance/impaired     Grooming: Standing;Supervision/safety;Wash/dry face;Wash/dry hands;Oral care Grooming Details (indicate cue type and reason): standing at sink   Upper Body Bathing Details (indicate cue type and reason): declined to participate in standing at sink     Upper Body Dressing : Min guard;Standing Upper Body Dressing Details (indicate cue type and reason): at sink with hosptial gown     Toilet Transfer: Supervision/safety;Ambulation Toilet Transfer Details (indicate cue type and reason): with no AD with some unsteadiness noted. HR noted to range from 100- 126 bpm during session Toileting- Clothing Manipulation and Hygiene: Supervision/safety;Sit to/from stand                Cognition Arousal/Alertness: Awake/alert Behavior During Therapy: Flat affect Overall Cognitive Status: Within Functional Limits for tasks assessed                         Pertinent Vitals/ Pain       Pain Assessment Pain Assessment: No/denies pain         Frequency  Min 1X/week        Progress Toward Goals  OT Goals(current goals can now be found in the care plan section)  Progress towards OT goals: Progressing toward goals     Plan Discharge plan remains appropriate       AM-PAC OT "6 Clicks" Daily Activity     Outcome Measure   Help from another person eating meals?: A Little Help from another person taking care of personal grooming?: A Little Help from another person toileting, which includes using toliet, bedpan, or urinal?:  A Little Help from another person bathing (including washing, rinsing, drying)?: A Little Help from another person to put on and taking off regular upper body clothing?: A Little Help from another person to put on and taking off regular lower body clothing?: A Little 6 Click Score: 18    End of Session    OT Visit Diagnosis: Unsteadiness on feet (R26.81);Other abnormalities of  gait and mobility (R26.89);Repeated falls (R29.6)   Activity Tolerance Patient tolerated treatment well   Patient Left in chair;with call bell/phone within reach;with family/visitor present   Nurse Communication Mobility status        Time: 1020-1037 OT Time Calculation (min): 17 min  Charges: OT General Charges $OT Visit: 1 Visit OT Treatments $Self Care/Home Management : 8-22 mins  Rosalio Loud, MS Acute Rehabilitation Department Office# [phone]   Selinda Flavin 02/09/2023, 10:44 AM

## 2023-02-09 NOTE — Progress Notes (Signed)
PROGRESS NOTE  Kara Bush ZOX:[id number] DOB: 10-09-1940 DOA: 02/04/2023 PCP: Jackelyn Poling, DO   LOS: 4 days   Brief Narrative / Interim history: 82 year old female with history of ulcerative colitis, CKD, ascending thoracic aneurysm comes into the hospital with confusion, nausea, vomiting.  Family tells me that she has been hospitalized in April for acute blood loss anemia requiring blood transfusions, underwent an EGD which showed hiatal hernia, and also a colonoscopy which showed stable UC comes to the hospital with progressive nausea over the last 2 months.  Since she was discharged she has been having progressive nausea, difficulties with poor p.o. intake, and an associated 10 pound weight loss.  Daughter also reports intermittent diarrhea but none currently.  Family also states that she has been more confused over the last 2 months, especially in the last several days has been having intermittent slurred speech and difficulty finding words  Subjective / 24h Interval events: Up in the chair, very alert, daughters at bedside  Assesement and Plan: Principal Problem:   Acute encephalopathy Active Problems:   Chest wall pain   PAD (peripheral artery disease), aortic-iliac bypass 1991, mild carotid bruits   OSA (obstructive sleep apnea)   Chronic fatigue syndrome with fibromyalgia   Accelerated hypertension   Ulcerative colitis (HCC)   Atrial fibrillation (HCC)   CKD (chronic kidney disease) stage 3, GFR 30-59 ml/min (HCC)   Elevated troponin   Hypokalemia   Hypomagnesemia   Intractable nausea and vomiting   Prolonged QT interval   Metabolic alkalosis   Principal problem Intractable nausea, vomiting -ongoing for the past 2 months, following her most recent hospitalization.  Apparently family has been working with Dr. Elnoria Howard as an outpatient to get this under control.  This is associated with a 10 pound weight loss, and now with profound hypokalemia -GI consulted, Dr. Loreta Ave  evaluated patient, and its likely multifactorial, potentially polypharmacy and side effects from that -Continue scheduled antiemetics.  Seems to be doing well, nausea is under control and has been able to eat better -She does tell me today that occasionally she feels like food is getting stuck in her chest.  Recently had an endoscopy which did not show any significant strictures so I wonder whether it is a motility problem.  Obtain barium esophagogram  Active problems Acute CVA -with reported intermittent slurred speech and occasional confusion over the last several days.  MRI done yesterday shows a small acute CVA.  Neurology consulted.  For now continue Eliquis -Workup showed an LDL of 80, A1c 5.6.  CT angiogram without LVO.  2D echo unremarkable  Troponin elevation -on admission, EKG showed some ST depressions in the inferolateral leads, cardiology was consulted as a code STEMI however this was canceled as it was not felt to be a STEMI.  Discussed with Dr. Jens Som, this is likely demand ischemia in the setting of acute illness.  She does not have any chest pain, and echocardiogram done during this admission is reassuringly normal, with normal EF and no wall motion abnormalities.  Hypokalemia, hypomagnesemia-continue to replace potassium and magnesium as well today  Chronic pain, fibromyalgia -continue home hydrocodone  PAD, history of aortoiliac bypass 1991 -CT angiogram without acute findings  Ulcerative colitis - stable  PAF - continue metoprolol, eliquis  Scheduled Meds:   stroke: early stages of recovery book   Does not apply Once   apixaban  2.5 mg Oral BID   atorvastatin  40 mg Oral Daily   Chlorhexidine Gluconate Cloth  6 each Topical Daily   citalopram  20 mg Oral Daily   feeding supplement  1 Container Oral BID BM   hydrALAZINE  25 mg Oral Q8H   isosorbide mononitrate  15 mg Oral Daily   metoprolol succinate  75 mg Oral Daily   ondansetron  4 mg Oral Q8H   Continuous  Infusions:  thiamine (VITAMIN B1) injection 500 mg (02/09/23 1011)   PRN Meds:.albuterol, HYDROcodone-acetaminophen, metoprolol tartrate  Current Outpatient Medications  Medication Instructions   amLODipine (NORVASC) 5 mg, Oral, Daily   apixaban (ELIQUIS) 2.5 mg, Oral, 2 times daily   atorvastatin (LIPITOR) 20 mg, Oral, Daily   b complex vitamins capsule 1 capsule, Oral, Daily   betamethasone dipropionate 0.05 % cream 1 Application, Daily   citalopram (CELEXA) 20 MG tablet 1 tablet, Daily   clobetasol ointment (TEMOVATE) 0.05 % 1 Application, 2 times daily   ezetimibe (ZETIA) 10 mg, Oral, Daily   ferrous sulfate 325 mg, Daily   furosemide (LASIX) 40 mg, Oral, 3 times weekly, Increase to every day as needed for weight gain of 2lbs overnight or 5lbs in 1 week   HYDROcodone-acetaminophen (NORCO/VICODIN) 5-325 MG tablet 1 tablet, Oral, 4 times daily PRN, May Take an extra 0.5 to one tablet when pain is severe.   isosorbide mononitrate (IMDUR) 15 mg, Oral, Daily   methocarbamol (ROBAXIN) 500 MG tablet TAKE 1 TABLET BY MOUTH EVERY 8 HOURS AS NEEDED FOR MUSCLE SPASM   metoprolol succinate (TOPROL-XL) 50 MG 24 hr tablet TAKE ONE TABLET BY MOUTH ONCE daily   Multiple Vitamin (MULITIVITAMIN WITH MINERALS) TABS 1 tablet, Oral, Daily,     ondansetron (ZOFRAN) 4 mg, Oral, Every 8 hours PRN   potassium chloride (KLOR-CON M) 10 MEQ tablet 10 mEq, Oral, 3 times weekly, And with extra lasix as needed.   sacubitril-valsartan (ENTRESTO) 97-103 MG 1 tablet, 2 times daily   zolpidem (AMBIEN CR) 12.5 mg, Oral, Daily at bedtime    Diet Orders (From admission, onward)     Start     Ordered   02/06/23 0906  DIET SOFT Room service appropriate? Yes; Fluid consistency: Thin  Diet effective now       Question Answer Comment  Room service appropriate? Yes   Fluid consistency: Thin      02/06/23 0905            DVT prophylaxis: apixaban (ELIQUIS) tablet 2.5 mg Start: 02/05/23 1000 SCDs Start: 02/05/23  0355 apixaban (ELIQUIS) tablet 2.5 mg   Lab Results  Component Value Date   PLT 214 02/07/2023      Code Status: DNR  Family Communication: No family at bedside this morning  Status is: Inpatient Remains inpatient appropriate because: severity of illness  Level of care: Progressive  Consultants:  none  Objective: Vitals:   02/08/23 1436 02/08/23 2050 02/09/23 0128 02/09/23 0554  BP: (!) 147/66 (!) 150/61 (!) 162/72 (!) 173/68  Pulse: 89 100 (!) 109 93  Resp: 16 (!) 22 13 20   Temp: 98 F (36.7 C) 99.1 F (37.3 C) 98 F (36.7 C) 97.9 F (36.6 C)  TempSrc: Oral Oral Oral Oral  SpO2: 99% 97% 98% 93%  Weight:      Height:        Intake/Output Summary (Last 24 hours) at 02/09/2023 1132 Last data filed at 02/09/2023 0837 Gross per 24 hour  Intake 893.95 ml  Output 1251 ml  Net -357.05 ml    Wt Readings from Last 3 Encounters:  02/05/23  80.2 kg  01/23/23 84.3 kg  12/26/22 83.5 kg    Examination:  Constitutional: NAD Eyes: lids and conjunctivae normal, no scleral icterus ENMT: mmm Neck: normal, supple Respiratory: clear to auscultation bilaterally, no wheezing, no crackles. Normal respiratory effort.  Cardiovascular: Regular rate and rhythm, no murmurs / rubs / gallops. No LE edema. Abdomen: soft, no distention, no tenderness. Bowel sounds positive.  Skin: no rashes  Data Reviewed: I have independently reviewed following labs and imaging studies   CBC Recent Labs  Lab 02/04/23 2115 02/05/23 0309 02/05/23 0537 02/06/23 0259 02/07/23 0302  WBC 13.5* 14.4* 13.0* 14.3* 12.1*  HGB 14.1 12.2 11.5* 10.8* 10.3*  HCT 47.2* 40.2 38.5 36.7 35.4*  PLT 304 268 231 255 214  MCV 70.4* 70.7* 70.9* 73.1* 74.5*  MCH 21.0* 21.4* 21.2* 21.5* 21.7*  MCHC 29.9* 30.3 29.9* 29.4* 29.1*  RDW 19.2* 18.2* 18.4* 18.3* 18.4*  LYMPHSABS  --  1.6  --   --   --   MONOABS  --  0.8  --   --   --   EOSABS  --  0.0  --   --   --   BASOSABS  --  0.1  --   --   --      Recent Labs   Lab 02/04/23 2201 02/04/23 2202 02/05/23 0308 02/05/23 0310 02/05/23 0314 02/05/23 0537 02/05/23 1552 02/06/23 0259 02/06/23 1532 02/07/23 0302 02/08/23 0259 02/09/23 0337  NA  --   --   --   --   --  131*  --  133*  --  135 133* 135  K  --   --   --   --   --  2.1*   < > 2.9* 3.7 3.5 3.7 3.2*  CL  --   --   --   --   --  85*  --  93*  --  96* 96* 97*  CO2  --   --   --   --   --  34*  --  30  --  30 27 30   GLUCOSE  --   --   --   --   --  150*  --  140*  --  115* 118* 115*  BUN  --   --   --   --   --  15  --  14  --  16 19 18   CREATININE  --   --   --   --   --  1.06*  --  1.20*  --  1.03* 1.02* 1.10*  CALCIUM  --   --   --   --   --  8.3*  --  8.4*  --  8.7* 8.7* 8.7*  AST 28  --   --   --   --  25  --  46*  --   --   --   --   ALT 18  --   --   --   --  17  --  21  --   --   --   --   ALKPHOS 56  --   --   --   --  43  --  43  --   --   --   --   BILITOT 0.8  --   --   --   --  0.9  --  0.9  --   --   --   --  ALBUMIN 4.3  --   --   --   --  3.6  --  3.5  --   --   --   --   MG  --   --   --   --   --  1.8  --  1.6*  --  2.2 1.8 1.7  LATICACIDVEN  --  3.2*  --   --  1.6  --   --   --   --   --   --   --   TSH  --   --   --  0.733  --   --   --   --   --   --   --   --   HGBA1C  --   --   --   --   --   --   --  5.6  --   --   --   --   AMMONIA  --   --  16  --   --   --   --   --   --   --   --   --    < > = values in this interval not displayed.     ------------------------------------------------------------------------------------------------------------------ No results for input(s): "CHOL", "HDL", "LDLCALC", "TRIG", "CHOLHDL", "LDLDIRECT" in the last 72 hours.   Lab Results  Component Value Date   HGBA1C 5.6 02/06/2023   ------------------------------------------------------------------------------------------------------------------ No results for input(s): "TSH", "T4TOTAL", "T3FREE", "THYROIDAB" in the last 72 hours.  Invalid input(s): "FREET3"   Cardiac  Enzymes No results for input(s): "CKMB", "TROPONINI", "MYOGLOBIN" in the last 168 hours.  Invalid input(s): "CK" ------------------------------------------------------------------------------------------------------------------    Component Value Date/Time   BNP 96.8 10/23/2021 1417   BNP 263.1 (H) 02/26/2021 1314    CBG: Recent Labs  Lab 02/04/23 2151 02/05/23 0445 02/05/23 0915  GLUCAP 166* 142* 158*     Recent Results (from the past 240 hour(s))  SARS Coronavirus 2 by RT PCR (hospital order, performed in [facility] hospital lab) *cepheid single result test* Anterior Nasal Swab     Status: None   Collection Time: 02/05/23  2:44 AM   Specimen: Anterior Nasal Swab  Result Value Ref Range Status   SARS Coronavirus 2 by RT PCR NEGATIVE NEGATIVE Final    Comment: (NOTE) SARS-CoV-2 target nucleic acids are NOT DETECTED.  The SARS-CoV-2 RNA is generally detectable in upper and lower respiratory specimens during the acute phase of infection. The lowest concentration of SARS-CoV-2 viral copies this assay can detect is 250 copies / mL. A negative result does not preclude SARS-CoV-2 infection and should not be used as the sole basis for treatment or other patient management decisions.  A negative result may occur with improper specimen collection / handling, submission of specimen other than nasopharyngeal swab, presence of viral mutation(s) within the areas targeted by this assay, and inadequate number of viral copies (<250 copies / mL). A negative result must be combined with clinical observations, patient history, and epidemiological information.  Fact Sheet for Patients:   RoadLapTop.co.za  Fact Sheet for Healthcare Providers: http://kim-miller.com/  This test is not yet approved or  cleared by the [place] FDA and has been authorized for detection and/or diagnosis of SARS-CoV-2 by FDA under an Emergency Use Authorization  (EUA).  This EUA will remain in effect (meaning this test can be used) for the duration of the COVID-19 declaration under Section 564(b)(1) of the Act, 21 U.S.C. section 360bbb-3(b)(1), unless  the authorization is terminated or revoked sooner.  Performed at [facility], [address], [place], [place] [address]      Radiology Studies: CT HEAD WO CONTRAST ( )  Result Date: 02/08/2023 CLINICAL DATA:  Altered mental status EXAM: CT HEAD WITHOUT CONTRAST TECHNIQUE: Contiguous axial images were obtained from the base of the skull through the vertex without intravenous contrast. RADIATION DOSE REDUCTION: This exam was performed according to the departmental dose-optimization program which includes automated exposure control, adjustment of the mA and/or kV according to patient size and/or use of iterative reconstruction technique. COMPARISON:  None Available. FINDINGS: Brain: There is no mass, hemorrhage or extra-axial collection. There is generalized atrophy without lobar predilection. Hypodensity of the white matter is most commonly associated with chronic microvascular disease. Vascular: Atherosclerotic calcification of the internal carotid arteries at the skull base. No abnormal hyperdensity of the major intracranial arteries or dural venous sinuses. Skull: The visualized skull base, calvarium and extracranial soft tissues are normal. Sinuses/Orbits: No fluid levels or advanced mucosal thickening of the visualized paranasal sinuses. No mastoid or middle ear effusion. The orbits are normal. IMPRESSION: 1. No acute intracranial abnormality. 2. Generalized atrophy and findings of chronic microvascular disease. Electronically Signed   By: Deatra Robinson M.D.   On: 02/08/2023 19:34     Pamella Pert, MD, PhD [facility]  Between 7 am - 7 pm I am available, please contact me via Amion (for emergencies) or Securechat (non urgent messages)  Between 7 pm - 7 am I am not  available, please contact night coverage MD/APP via Amion

## 2023-02-09 NOTE — Progress Notes (Signed)
Physical Therapy Treatment Patient Details Name: Kara Bush MRN: [id number] DOB: 09-15-1940 Today's Date: 02/09/2023   History of Present Illness Patient is a 82 year old female who presented on 02/04/23 with poor oral intake, progressive nausea, increased confusion, and intermittent slurred speech. Patient found to have small L parietal acute CVA, PMH: fibromyalgia, AAA, CAD s/p CABG, chronic fatigue/chronic pain, HLD, HTN.    PT Comments  Pt making good progress.  Able to ambulate 200' without AD and performed stairs.  Did need min cues for safety and min guard.  Pt tolerating multiple standing exercises and requesting rest breaks as needed.  Continue plan of care.  Recommendation was for HHPT - continue to recommend as pt has stairs to enter home and below baseline.       Assistance Recommended at Discharge Intermittent Supervision/Assistance  If plan is discharge home, recommend the following:  Can travel by private vehicle    A little help with walking and/or transfers;A little help with bathing/dressing/bathroom;Assistance with cooking/housework;Assist for transportation;Help with stairs or ramp for entrance      Equipment Recommendations  None recommended by PT    Recommendations for Other Services       Precautions / Restrictions Precautions Precautions: Fall     Mobility  Bed Mobility Overal bed mobility: Needs Assistance Bed Mobility: Supine to Sit     Supine to sit: Supervision          Transfers Overall transfer level: Needs assistance Equipment used: None Transfers: Sit to/from Stand Sit to Stand: Supervision           General transfer comment: supervision for safety; performed x 2 trnasfers and x 10 ther ex    Ambulation/Gait Ambulation/Gait assistance: Min guard Gait Distance (Feet): 200 Feet Assistive device: None Gait Pattern/deviations: Step-through pattern Gait velocity: decreased     General Gait Details: Good pace, min guard  for safety, good self monitoring (asked for standing rest break); HR 108 bpm   Stairs Stairs: Yes Stairs assistance: Min guard Stair Management: One rail Right, Step to pattern, Forwards Number of Stairs: 4 General stair comments: Pt tending to keep hips and knees flexed once up on next step.  Cued to stand upright and straighten legs - improved with cues   Wheelchair Mobility     Tilt Bed    Modified Rankin (Stroke Patients Only)       Balance Overall balance assessment: Mild deficits observed, not formally tested Sitting-balance support: Feet unsupported, No upper extremity supported Sitting balance-Leahy Scale: Good     Standing balance support: No upper extremity supported, Single extremity supported Standing balance-Leahy Scale: Good Standing balance comment: RW to ambulate ; single UE support with balance challenges                            Cognition Arousal/Alertness: Awake/alert Behavior During Therapy: WFL for tasks assessed/performed Overall Cognitive Status: Within Functional Limits for tasks assessed                                 General Comments: oriented, appropriate conversation, answered questions appropriately, followed commands, good self monitoring and asking for rest breaks as needed        Exercises General Exercises - Lower Extremity Hip Flexion/Marching: AROM, Both, 10 reps, Seated Other Exercises Other Exercises: Sit to stand x 10 with hands on knees Other Exercises: 1x10 heel  raises and 1x10 standing marching with single UE support and min guat Other Exercises: isometric chair pushup x 10    General Comments        Pertinent Vitals/Pain Pain Assessment Pain Assessment: No/denies pain    Home Living     Available Help at Discharge: Family;Available PRN/intermittently Type of Home: House                  Prior Function            PT Goals (current goals can now be found in the care plan  section) Progress towards PT goals: Progressing toward goals    Frequency    Min 1X/week      PT Plan Current plan remains appropriate    Co-evaluation              AM-PAC PT "6 Clicks" Mobility   Outcome Measure  Help needed turning from your back to your side while in a flat bed without using bedrails?: None Help needed moving from lying on your back to sitting on the side of a flat bed without using bedrails?: None Help needed moving to and from a bed to a chair (including a wheelchair)?: A Little Help needed standing up from a chair using your arms (e.g., wheelchair or bedside chair)?: A Little Help needed to walk in hospital room?: A Little Help needed climbing 3-5 steps with a railing? : A Little 6 Click Score: 20    End of Session Equipment Utilized During Treatment: Gait belt Activity Tolerance: Patient tolerated treatment well Patient left: in chair;with call bell/phone within reach;with family/visitor present Nurse Communication: Mobility status PT Visit Diagnosis: Unsteadiness on feet (R26.81);Difficulty in walking, not elsewhere classified (R26.2)     Time: 1308-6578 PT Time Calculation (min) (ACUTE ONLY): 18 min  Charges:    $Gait Training: 8-22 mins PT General Charges $$ ACUTE PT VISIT: 1 Visit                     Anise Salvo, PT Acute Rehab Services [facility] [phone]    Rayetta Humphrey 02/09/2023, 3:47 PM

## 2023-02-10 ENCOUNTER — Inpatient Hospital Stay (HOSPITAL_COMMUNITY): Payer: PPO

## 2023-02-10 DIAGNOSIS — G9340 Encephalopathy, unspecified: Secondary | ICD-10-CM | POA: Diagnosis not present

## 2023-02-10 DIAGNOSIS — I4819 Other persistent atrial fibrillation: Secondary | ICD-10-CM | POA: Diagnosis not present

## 2023-02-10 LAB — BASIC METABOLIC PANEL
Anion gap: 10 (ref 5–15)
BUN: 15 mg/dL (ref 8–23)
CO2: 29 mmol/L (ref 22–32)
Calcium: 8.8 mg/dL — ABNORMAL LOW (ref 8.9–10.3)
Chloride: 97 mmol/L — ABNORMAL LOW (ref 98–111)
Creatinine, Ser: 1.09 mg/dL — ABNORMAL HIGH (ref 0.44–1.00)
GFR, Estimated: 50 mL/min — ABNORMAL LOW (ref >60)
Glucose, Bld: 118 mg/dL — ABNORMAL HIGH (ref 70–99)
Potassium: 3.4 mmol/L — ABNORMAL LOW (ref 3.5–5.1)
Sodium: 136 mmol/L (ref 135–145)

## 2023-02-10 LAB — CBC
HCT: 32.9 % — ABNORMAL LOW (ref 36.0–46.0)
Hemoglobin: 9.6 g/dL — ABNORMAL LOW (ref 12.0–15.0)
MCH: 21.4 pg — ABNORMAL LOW (ref 26.0–34.0)
MCHC: 29.2 g/dL — ABNORMAL LOW (ref 30.0–36.0)
MCV: 73.4 fL — ABNORMAL LOW (ref 80.0–100.0)
Platelets: 298 10*3/uL (ref 150–400)
RBC: 4.48 MIL/uL (ref 3.87–5.11)
RDW: 18.7 % — ABNORMAL HIGH (ref 11.5–15.5)
WBC: 11.4 10*3/uL — ABNORMAL HIGH (ref 4.0–10.5)
nRBC: 0 % (ref 0.0–0.2)

## 2023-02-10 LAB — MAGNESIUM: Magnesium: 1.8 mg/dL (ref 1.7–2.4)

## 2023-02-10 LAB — VITAMIN D 25 HYDROXY (VIT D DEFICIENCY, FRACTURES): Vit D, 25-Hydroxy: 53.66 ng/mL (ref 30–100)

## 2023-02-10 LAB — BRAIN NATRIURETIC PEPTIDE: B Natriuretic Peptide: 739.7 pg/mL — ABNORMAL HIGH (ref 0.0–100.0)

## 2023-02-10 MED ORDER — TRAZODONE HCL 50 MG PO TABS: 25.0000 mg | ORAL_TABLET | Freq: Every evening | ORAL | Status: DC | PRN

## 2023-02-10 MED ORDER — PROCHLORPERAZINE EDISYLATE 10 MG/2ML IJ SOLN: 5.0000 mg | Freq: Four times a day (QID) | INTRAMUSCULAR | Status: DC | PRN

## 2023-02-10 MED ORDER — FUROSEMIDE 10 MG/ML IJ SOLN
40.0000 mg | Freq: Once | INTRAMUSCULAR | Status: AC
  Administered 2023-02-10: 40 mg via INTRAVENOUS
  Filled 2023-02-10: qty 4

## 2023-02-10 MED ORDER — ONDANSETRON HCL 4 MG/2ML IJ SOLN
4.0000 mg | Freq: Once | INTRAMUSCULAR | Status: AC
  Administered 2023-02-10: 4 mg via INTRAVENOUS
  Filled 2023-02-10: qty 2

## 2023-02-10 MED ORDER — SACUBITRIL-VALSARTAN 97-103 MG PO TABS
1.0000 | ORAL_TABLET | Freq: Two times a day (BID) | ORAL | Status: DC
  Administered 2023-02-10 – 2023-02-11 (×2): 1 via ORAL
  Filled 2023-02-10 (×4): qty 1

## 2023-02-10 MED ORDER — FOLIC ACID 1 MG PO TABS
1.0000 mg | ORAL_TABLET | Freq: Every day | ORAL | Status: DC
  Administered 2023-02-10 – 2023-02-14 (×5): 1 mg via ORAL
  Filled 2023-02-10 (×5): qty 1

## 2023-02-10 MED ORDER — POTASSIUM CHLORIDE CRYS ER 20 MEQ PO TBCR
20.0000 meq | EXTENDED_RELEASE_TABLET | Freq: Two times a day (BID) | ORAL | Status: DC
  Administered 2023-02-10 – 2023-02-11 (×4): 20 meq via ORAL
  Filled 2023-02-10 (×4): qty 1

## 2023-02-10 MED ORDER — MAGNESIUM SULFATE 2 GM/50ML IV SOLN
2.0000 g | Freq: Once | INTRAVENOUS | Status: AC
  Administered 2023-02-10: 2 g via INTRAVENOUS
  Filled 2023-02-10: qty 50

## 2023-02-10 MED ORDER — HYDROCODONE-ACETAMINOPHEN 5-325 MG PO TABS
0.5000 | ORAL_TABLET | Freq: Three times a day (TID) | ORAL | Status: DC | PRN
  Administered 2023-02-10 – 2023-02-13 (×4): 0.5 via ORAL
  Filled 2023-02-10 (×4): qty 1

## 2023-02-10 MED ORDER — PANTOPRAZOLE SODIUM 40 MG PO TBEC
40.0000 mg | DELAYED_RELEASE_TABLET | Freq: Two times a day (BID) | ORAL | Status: DC
  Administered 2023-02-10 – 2023-02-14 (×9): 40 mg via ORAL
  Filled 2023-02-10 (×9): qty 1

## 2023-02-10 MED ORDER — TRAZODONE HCL 50 MG PO TABS
25.0000 mg | ORAL_TABLET | Freq: Every evening | ORAL | Status: DC | PRN
  Administered 2023-02-11 – 2023-02-13 (×3): 25 mg via ORAL
  Filled 2023-02-10 (×3): qty 1

## 2023-02-10 MED ORDER — EMPAGLIFLOZIN 10 MG PO TABS
10.0000 mg | ORAL_TABLET | Freq: Every day | ORAL | Status: DC
  Administered 2023-02-10 – 2023-02-12 (×3): 10 mg via ORAL
  Filled 2023-02-10 (×4): qty 1

## 2023-02-10 NOTE — Consult Note (Addendum)
Cardiology Consultation   Patient ID: Kara Bush MRN: [id number]; DOB: Feb 19, 1941  Admit date: 02/04/2023 Date of Consult: 02/10/2023  PCP:  Jackelyn Poling, DO   [facility] Providers Cardiologist:  Chrystie Nose, MD  Electrophysiologist:  Lanier Prude, MD  {   Patient Profile:   Kara Bush is a 82 y.o. female with a hx of CAD s/p CABG x 3  (LIMA-LAD, SVG-Diag, and SVG-RCA) in 1998 s/p multiple percutaneous interventions, paroxysmal atrial fibrillation on Eliquis, bicuspid aortic valve with mild to moderate regurgitation, pulmonary embolism, thoracic aortic aneurysm, chronic diastolic heart failure, TIA, PAD s/p aortoiliac bypass, hypertension, hyperlipidemia, OSA, and recent admission for acute blood loss anemia, who is being seen 02/10/2023 for the evaluation of dyspnea at the request of Dr. Sunnie Nielsen.  History of Present Illness:   Ms. [name] has extensive cardiac history with CABG in 1998 with multiple subsequent stenting (2000, 2005, 2006).  She had a negative nuclear stress test in 2014.  She was last seen on 12/26/2022 at our office for hospitalization follow-up after being admitted for acute blood loss anemia requiring multiple units of PRBC.  EGD at that time revealed mild ulcerative colitis.  Outpatient follow-up with GI was recommended.  During her admission she had mild elevation of troponins that was thought to be demand ischemia secondary to anemia. PET stress test was recommended however does not appear patient ever completed this.  At her last office visit she had some intermittent complaints of chest discomfort and nausea.  Was also noted to be in atrial fibrillation. She was also seen by Dr. Lalla Brothers for consideration of Watchman device.  Per his recommendations plan is now for Eliquis at reduced dose of 2.5 mg and if no complications may resume back to full dose at upcoming follow-up visit in July.  Currently patient was admitted on February 05, 2023  due to complaints of confusion, nausea and vomiting.  Has had abnormal barium swallow test noting dysmotility. On admission EKG showed some ST depressions in the inferolateral leads with elevated troponins.  Code STEMI was initially called but canceled after case was discussed with Dr. Jens Som who felt it was more likely due to demand ischemia.  She has not had any chest pain throughout this admission and echocardiogram was reassuring that showed normal EF no wall motion abnormalities.  Upon further evaluation it appears that patient has had a small acute CVA noted on MRI 02/08/2023.  Cardiology has now been consulted due to dyspnea.  Of note: Patient was in normal sinus rhythm on admission however now is in atrial fibrillation.  Appears patient fluctuates in and out regularly.  At her last appointment she was in atrial fibrillation.  Patient stating that she has mild complaints of dyspnea.  Patient states that she is not significantly limited by her HFpEF or atrial fibrillation.  She is able to complete her ADLs without significant limitations.  At home she takes 40 mg Lasix every other day.  She has not gotten this regularly during this admission.  Also, it appears that chronically patient has some degree of shortness of breath however this has been slightly more pronounced during this admission and has also been complaining more tightness in her legs.  However, she denies any orthopnea or significant shortness of breath with exertion.  Currently she is on 2 L via nasal cannula.  She is sitting comfortably in bed.     Past Medical History:  Diagnosis Date   Abdominal aortic  aneurysm (HCC)    Anemia, improved 01/11/2013   Basal cell carcinoma    CAD (coronary artery disease)    hx CABG 1998, last cath 2007 with PCI and stenting to the proximal segment of the vein graft to the diagonal branch. DES Taxus was placed   Cervicalgia    Chronic fatigue fibromyalgia syndrome    Chronic pain syndrome     Depression    Fibromyalgia    History of nuclear stress test 08/07/2011   lexiscan; no evidence of ischemia or infarct; low risk    Hyperlipidemia    Hypertension    OSA (obstructive sleep apnea)    PAD (peripheral artery disease) (HCC)    Pes anserine bursitis    Pulmonary embolism (HCC) 01/11/2013   S/P resection of aortic aneurysm    Trochanteric bursitis    Ulcerative colitis [facility])     Past Surgical History:  Procedure Laterality Date   AORTOBIEXTERNAL ILIAC BYPASS  1991   BIOPSY  11/25/2022   Procedure: BIOPSY;  Surgeon: Jeani Hawking, MD;  Location: [facility] ENDOSCOPY;  Service: Gastroenterology;;   CATARACT EXTRACTION Left 09/2019   CHOLECYSTECTOMY  1997   COLONOSCOPY WITH PROPOFOL N/A 11/25/2022   Procedure: COLONOSCOPY WITH PROPOFOL;  Surgeon: Jeani Hawking, MD;  Location: [facility] ENDOSCOPY;  Service: Gastroenterology;  Laterality: N/A;   CORONARY ANGIOPLASTY WITH STENT PLACEMENT  2006   to LAD, ATRETIC LIMA AT THAT TIME ALSO   CORONARY ANGIOPLASTY WITH STENT PLACEMENT  2007   STENT TO PROX VG-DIAG, OTHER STENTS PATENT   CORONARY ARTERY BYPASS GRAFT  1998   LIMA-LAD;VG-DIAG & RCA   CORONARY STENT PLACEMENT  2005   TO LAD & LCX-STENTS,   ESOPHAGOGASTRODUODENOSCOPY (EGD) WITH PROPOFOL N/A 11/25/2022   Procedure: ESOPHAGOGASTRODUODENOSCOPY (EGD) WITH PROPOFOL;  Surgeon: Jeani Hawking, MD;  Location: [facility] ENDOSCOPY;  Service: Gastroenterology;  Laterality: N/A;   HERNIA REPAIR     TUBAL LIGATION     BTL   Inpatient Medications: Scheduled Meds:   stroke: early stages of recovery book   Does not apply Once   apixaban  2.5 mg Oral BID   atorvastatin  40 mg Oral Daily   Chlorhexidine Gluconate Cloth  6 each Topical Daily   citalopram  20 mg Oral Daily   feeding supplement  1 Container Oral BID BM   hydrALAZINE  25 mg Oral Q8H   isosorbide mononitrate  15 mg Oral Daily   metoprolol succinate  75 mg Oral Daily   ondansetron  4 mg Oral Q8H   Continuous Infusions:  magnesium sulfate  bolus IVPB     thiamine (VITAMIN B1) injection 500 mg (02/10/23 1132)   PRN Meds: acetaminophen, albuterol, HYDROcodone-acetaminophen, HYDROcodone-acetaminophen, metoprolol tartrate  Allergies:    Allergies  Allergen Reactions   Biotin Hives   Lisinopril Cough   Atenolol Other (See Comments)    colitis Other reaction(s): Bowel issues   Duloxetine Hcl Other (See Comments)    Mad pt feel spacey Other reaction(s): shaky; tired in the PMs   Lyrica [Pregabalin] Other (See Comments)    Made pt feel spacey   Penicillin G Rash    Other reaction(s): rash   Penicillins Rash   Sulfamethoxazole-Trimethoprim Nausea And Vomiting    Other reaction(s): elevated liver functions    Social History:   Social History   Socioeconomic History   Marital status: Divorced    Spouse name: Not on file   Number of children: 2   Years of education: college  Highest education level: Not on file  Occupational History    Employer: RETIRED  Tobacco Use   Smoking status: Former    Packs/day: 2.50    Years: 20.00    Additional pack years: 0.00    Total pack years: 50.00    Types: Cigarettes    Quit date: 06/29/1990    Years since quitting: 32.6    Passive exposure: Never   Smokeless tobacco: Never  Vaping Use   Vaping Use: Never used  Substance and Sexual Activity   Alcohol use: No   Drug use: No   Sexual activity: Not on file  Other Topics Concern   Not on file  Social History Narrative   Not on file   Social Determinants of Health   Financial Resource Strain: Not on file  Food Insecurity: No Food Insecurity (02/08/2023)   Hunger Vital Sign    Worried About Running Out of Food in the Last Year: Never true    Ran Out of Food in the Last Year: Never true  Transportation Needs: No Transportation Needs (02/08/2023)   PRAPARE - Administrator, Civil Service (Medical): No    Lack of Transportation (Non-Medical): No  Physical Activity: Not on file  Stress: Not on file  Social  Connections: Not on file  Intimate Partner Violence: Not At Risk (02/08/2023)   Humiliation, Afraid, Rape, and Kick questionnaire    Fear of Current or Ex-Partner: No    Emotionally Abused: No    Physically Abused: No    Sexually Abused: No    Family History:   Family History  Problem Relation Age of Onset   Heart disease Father    Heart attack Father        @ 62   Cancer Paternal Aunt        BREAST   Heart failure Mother        at 29   Healthy Sister    Cancer Brother        pancreatic    Heart failure Maternal Grandmother    Heart attack Maternal Grandfather    Heart attack Paternal Grandmother        at 48   Healthy Daughter    Healthy Daughter      ROS:  Please see the history of present illness.  All other ROS reviewed and negative.     Physical Exam/Data:   Vitals:   02/09/23 1427 02/09/23 2031 02/10/23 0705 02/10/23 1023  BP: (!) 156/91 (!) 160/73 (!) 174/70 (!) 156/81  Pulse: 93 82 92 78  Resp: 19 16 18    Temp: 97.7 F (36.5 C) 98 F (36.7 C) 98.2 F (36.8 C)   TempSrc: Oral Oral Oral   SpO2: 98% 96% 95%   Weight:      Height:        Intake/Output Summary (Last 24 hours) at 02/10/2023 1140 Last data filed at 02/10/2023 0300 Gross per 24 hour  Intake 657 ml  Output --  Net 657 ml      02/05/2023    4:37 AM 02/04/2023    9:01 PM 01/23/2023    2:27 PM  Last 3 Weights  Weight (lbs) 176 lb 12.9 oz 176 lb 185 lb 12.8 oz  Weight (kg) 80.2 kg 79.833 kg 84.278 kg     Body mass index is 30.35 kg/m.  General:  Well nourished, well developed, in no acute distress.  On 2 L via nasal cannula HEENT: normal Neck: no JVD  Vascular: No carotid bruits; Distal pulses 2+ bilaterally Cardiac:  normal S1, S2; RRR; no murmur.  Irregularly irregular. Lungs:  clear to auscultation bilaterally, no wheezing, rhonchi or rales  Abd: soft, nontender, no hepatomegaly  Ext: 1+ pitting edema Musculoskeletal:  No deformities, BUE and BLE strength normal and equal Skin: warm and  dry  Neuro:  CNs 2-12 intact, no focal abnormalities noted Psych:  Normal affect   EKG:  The EKG was personally reviewed and demonstrates:  Atrial fibrillation, heart rate 83. Telemetry:  Telemetry was personally reviewed and demonstrates: Atrial fibrillation heart rate 80s-90s  Relevant CV Studies: Echocardiogram 02/05/2023  1. Left ventricular ejection fraction, by estimation, is 55 to 60%. The  left ventricle has normal function. The left ventricle has no regional  wall motion abnormalities. Left ventricular diastolic parameters are  indeterminate.   2. Right ventricular systolic function is normal. The right ventricular  size is normal. There is normal pulmonary artery systolic pressure.   3. Left atrial size was mildly dilated.   4. The mitral valve is degenerative. Mild mitral valve regurgitation. No  evidence of mitral stenosis.   5. Partial fusion of right and left cusps. The aortic valve is tricuspid.  There is moderate calcification of the aortic valve. There is moderate  thickening of the aortic valve. Aortic valve regurgitation is mild.  Moderate aortic valve stenosis.   6. The inferior vena cava is normal in size with greater than 50%  respiratory variability, suggesting right atrial pressure of 3 mmHg.   Laboratory Data:  High Sensitivity Troponin:   Recent Labs  Lab 02/04/23 2327 02/05/23 0309 02/05/23 0537 02/05/23 1430 02/06/23 0711  TROPONINIHS 44* 43* 91* 633* 735*     Chemistry Recent Labs  Lab 02/08/23 0259 02/09/23 0337 02/10/23 0429  NA 133* 135 136  K 3.7 3.2* 3.4*  CL 96* 97* 97*  CO2 27 30 29   GLUCOSE 118* 115* 118*  BUN 19 18 15   CREATININE 1.02* 1.10* 1.09*  CALCIUM 8.7* 8.7* 8.8*  MG 1.8 1.7 1.8  GFRNONAA 55* 50* 50*  ANIONGAP 10 8 10     Recent Labs  Lab 02/04/23 2201 02/05/23 0537 02/06/23 0259  PROT 8.3* 6.7 6.7  ALBUMIN 4.3 3.6 3.5  AST 28 25 46*  ALT 18 17 21   ALKPHOS 56 43 43  BILITOT 0.8 0.9 0.9   Lipids  Recent Labs   Lab 02/06/23 0259  CHOL 144  TRIG 92  HDL 46  LDLCALC 80  CHOLHDL 3.1    Hematology Recent Labs  Lab 02/05/23 0537 02/06/23 0259 02/07/23 0302  WBC 13.0* 14.3* 12.1*  RBC 5.43* 5.02  5.03 4.75  HGB 11.5* 10.8* 10.3*  HCT 38.5 36.7 35.4*  MCV 70.9* 73.1* 74.5*  MCH 21.2* 21.5* 21.7*  MCHC 29.9* 29.4* 29.1*  RDW 18.4* 18.3* 18.4*  PLT 231 255 214   Thyroid  Recent Labs  Lab 02/05/23 0310  TSH 0.733    BNPNo results for input(s): "BNP", "PROBNP" in the last 168 hours.  DDimer No results for input(s): "DDIMER" in the last 168 hours.   Radiology/Studies:  DG ESOPHAGUS W SINGLE CM (SOL OR THIN BA)  Result Date: 02/09/2023 CLINICAL DATA:  Patient with difficulty swallowing.  Dysphagia. EXAM: ESOPHOGRAM/BARIUM SWALLOW TECHNIQUE: Single contrast examination was performed using thin barium. 13 mm barium tablet was also utilized. FLUOROSCOPY: Radiation Exposure Index (as provided by the fluoroscopic device): 1 minute 30 seconds COMPARISON:  None Available. FINDINGS: Oral and pharyngeal phases are  negative. No aspiration. No evidence of cervical stricture. No fixed lesion of the thoracic esophagus. No hiatal hernia. Advanced soft a geode dysmotility. Poor primary stripping wave. Abnormal tertiary contractions. Stasis within the esophagus. Abnormal to and fro flow. No evidence of esophagitis or ulceration. 13.5 mm barium tablet passes after a few additional swallows, ruling out fixed stricture. IMPRESSION: 1. Advanced presbyesophagus/esophageal dysmotility. Poor primary stripping wave. Abnormal tertiary contractions. Stasis within the esophagus. Abnormal to and fro flow. 2. No evidence of esophagitis or ulceration. No hernia or fixed stricture. 3. 13.5 mm barium tablet passes after a few additional swallows, ruling out fixed stricture. Electronically Signed   By: Paulina Fusi M.D.   On: 02/09/2023 13:33   CT HEAD WO CONTRAST ( )  Result Date: 02/08/2023 CLINICAL DATA:  Altered mental  status EXAM: CT HEAD WITHOUT CONTRAST TECHNIQUE: Contiguous axial images were obtained from the base of the skull through the vertex without intravenous contrast. RADIATION DOSE REDUCTION: This exam was performed according to the departmental dose-optimization program which includes automated exposure control, adjustment of the mA and/or kV according to patient size and/or use of iterative reconstruction technique. COMPARISON:  None Available. FINDINGS: Brain: There is no mass, hemorrhage or extra-axial collection. There is generalized atrophy without lobar predilection. Hypodensity of the white matter is most commonly associated with chronic microvascular disease. Vascular: Atherosclerotic calcification of the internal carotid arteries at the skull base. No abnormal hyperdensity of the major intracranial arteries or dural venous sinuses. Skull: The visualized skull base, calvarium and extracranial soft tissues are normal. Sinuses/Orbits: No fluid levels or advanced mucosal thickening of the visualized paranasal sinuses. No mastoid or middle ear effusion. The orbits are normal. IMPRESSION: 1. No acute intracranial abnormality. 2. Generalized atrophy and findings of chronic microvascular disease. Electronically Signed   By: Deatra Robinson M.D.   On: 02/08/2023 19:34   EEG adult  Result Date: 02/06/2023 Charlsie Quest, MD     02/06/2023  5:09 PM Patient Name: Kara Bush MRN: [id number] Epilepsy Attending: Charlsie Quest Referring Physician/Provider: Therisa Doyne, MD Date: 02/06/2023 Duration: 30.57 mins Patient history: 82yo F with ams getting eeg to evaluate for seizure. Level of alertness: Awake, asleep AEDs during EEG study: Ativan Technical aspects: This EEG study was done with scalp electrodes positioned according to the 10-20 International system of electrode placement. Electrical activity was reviewed with band pass filter of 1-70Hz , sensitivity of 7 uV/mm, display speed of 44mm/sec with a 60Hz   notched filter applied as appropriate. EEG data were recorded continuously and digitally stored.  Video monitoring was available and reviewed as appropriate. Description: The posterior dominant rhythm consists of 8-9 Hz activity of moderate voltage (25-35 uV) seen predominantly in posterior head regions, symmetric and reactive to eye opening and eye closing. Sleep was characterized by vertex waves, sleep spindles (12 to 14 Hz), maximal frontocentral region. EEG showed intermittent generalized polymorphic 3 to 6 Hz theta-delta slowing. Hyperventilation and photic stimulation were not performed.   ABNORMALITY - Intermittent slow, generalized IMPRESSION: This study is suggestive of mild diffuse encephalopathy, nonspecific etiology. No seizures or epileptiform discharges were seen throughout the recording. Priyanka Annabelle Harman     Assessment and Plan:  Dyspnea Chronic HFpEF Patient with minimal complaints of dyspnea.  She has known chronic HFpEF and likely volume up from correction of dehydration.  She has 1+ pitting edema and has remained sedentary throughout this admission.  My preliminary interpretation of chest x-ray does note some signs of pulmonary congestion/pleural effusion.  Echocardiogram during this admission shows normal LVEF, no significant changes.  Will check BNP and give one-time dose of IV Lasix 40 mg.  Then plan to switch back to home oral diuresis plan.  PTA p.o. Lasix 40 mg every other day as needed Will also give potassium supplementation. Will start jardiance 10mg . No previous issues with yeast infections. Counseled on side effects.  Continue Toprol XL 75mg  and resuming home entresto 97-103.  Paroxysmal atrial fibrillation Patient has fluctuated in and out of atrial fibrillation and NSR throughout this admission and at last office visit.  Given recent GI bleed in April hospitalization, plan is to continue reduced dose of Eliquis 2.5 mg twice daily, then reevaluate at upcoming appointment  in July.  Currently in A-fib right now with controlled rates around 80-90 Continue Eliquis 2.5 mg and Toprol-XL 75 mg  CAD status post CABG in 1998 was multiple subsequent stenting Elevated troponins Patient was reviewed by Dr. Jens Som who felt to be demand ischemia.  I agree with this.  Continue outpatient workup with PET stress test. Continue Imdur 15 mg, BB, statin, Zetia  Hypertension Will start to resume home meds. Will add back entresto and amlodipine tomorrow if BP stable.   Hyperlipidemia Continue atorvastatin 40 mg and zetia.   Moderate aortic valve stenosis.  Continue to monitor.   Anemia Recently admitted due to acute blood loss anemia requiring multiple transfusions.  Hemoglobin on admission 14.1.  Continues to downtrend since admission.  Currently 10.3.  Discussed with primary team, felt to falsy elevated on admission due to dehydration.   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  Nausea and vomiting.  Barium swallow test indicating dysmotility. GI following CKD stage 3       New York Heart Association (NYHA) Functional Class NYHA Class II  CHA2DS2-VASc Score = 5   This indicates a 7.2% annual risk of stroke. The patient's score is based upon: CHF History: 0 HTN History: 1 Diabetes History: 0 Stroke History: 0 Vascular Disease History: 1 Age Score: 2 Gender Score: 1    For questions or updates, please contact [facility] Please consult [email or web address] for contact info under    Signed, Abagail Kitchens, PA-C  02/10/2023 11:40 AM

## 2023-02-10 NOTE — Progress Notes (Addendum)
PROGRESS NOTE    Kara Bush  ZOX:[id number] DOB: 08/06/1940 DOA: 02/04/2023 PCP: Jackelyn Poling, DO   Brief Narrative: 82 year old with past medical history significant for ulcerative colitis, CKD, ascending thoracic aneurysm who presented to the hospital with confusion, nausea, vomiting.  Recent admission in April for acute blood loss anemia requiring blood transfusion, endoscopy only showed hiatal hernia, colonoscopy showed stable UC.  Since discharge she has been having difficulty with poor oral intake, nausea, 10 pounds weight loss.  Intermittent diarrhea.  Patient has been confused for the last 2 months, worse over the last several days, with intermittent slurred speech and difficulty finding words.   Assessment & Plan:   Principal Problem:   Acute encephalopathy Active Problems:   Chest wall pain   PAD (peripheral artery disease), aortic-iliac bypass 1991, mild carotid bruits   OSA (obstructive sleep apnea)   Chronic fatigue syndrome with fibromyalgia   Accelerated hypertension   Ulcerative colitis (HCC)   Atrial fibrillation (HCC)   CKD (chronic kidney disease) stage 3, GFR 30-59 ml/min (HCC)   Elevated troponin   Hypokalemia   Hypomagnesemia   Intractable nausea and vomiting   Prolonged QT interval   Metabolic alkalosis   1-Intractable nausea and vomiting: Thought to be multifactorial, related to medication, polypharmacy.  On schedule Zofran.  She had an episode of nausea and vomiting today. Extra dose of Zofran given IV.  Start PPI BID.  She will need small, frequent meals.  Will add Compazine as recommended by Dr Elnoria Howard.  Dr Elnoria Howard will follow up on patient today.   Presbyesophagus:  Small frequent meals.  PPI BID.   Dyspnea; report episodes of SOB on and off. Having episode today, on my assessment.  EKG obtain, afib,  Cardiology has been consulted.  Chest x ray: Minimal Bibasilar subsegmental atelectasis with small pleural effusion.  IV lasix order by  cardiology.    Acute CVA:  intermittent slurred speech and confusion.  Patient with report of intermittent slurred speech and occasional confusion over the last several days.  MRI showed small acute CVA.  Neurology was consulted.  Plan is to continue with Eliquis for now. LDL 80, A1c 5.6, CT angio without LVO.  2D echo unremarkable Received IV thiamine.   Elevation of troponin: On admission EKG showed some ST depression in the inferolateral leads, cardiology was consulted as a code STEMI however this was canceled as he was not felt to be a STEMI.  Case was discussed with Dr. Jens Som this is likely demand ischemia in the setting of acute illness.  Echo with normal ejection fraction and no wall motion abnormality.  Hypokalemia, hypomagnesemia: Replete orally and IV,   Chronic pain, fibromyalgia: Will continue with Hydrocodone every 4 Hours PRN.  At home she was taking half a tablet every 2 hours.  I will add half tablet hydrocodone for severe Pain TID PRN.  Will check Vitamin D level.   PAD, history of aortoiliac bypass 1991  Ulcerative colitis: stable.  Insomnia; will try trazodone PRN . Ambien has been discontinue  PAF: on eliquis and metoprolol.   Anemia. Hb was 14 on admission, suspect hemoconcentration.  Last several 11---10/  Repeat cbc pending.   Hypokalemia; replete orally.     Nutrition Problem: Inadequate oral intake Etiology: acute illness, nausea, vomiting, poor appetite    Signs/Symptoms: per patient/family report, energy intake < 75% for > or equal to 1 month    Interventions: Refer to RD note for recommendations  Estimated body mass index  is 30.35 kg/m as calculated from the following:   Height as of this encounter: 5\' 4"  (1.626 m).   Weight as of this encounter: 80.2 kg.   DVT prophylaxis: eliquis Code Status: DNR Family Communication:Daughter at bedside.  Disposition Plan:  Status is: Inpatient Remains inpatient appropriate because: management  of nausea, dyspnea.     Consultants:  Cardiology GI  Procedures:    Antimicrobials:    Subjective: She is not feeling well today. Yesterday had good day. Pain was controlled yesterday.  She report pain all over, arm, lower back, aching pain.  She report nausea, she vomited.  She report SOB started this am. She has had on and off SOB, she has history of A fib  Per daughter at home patient was sleeping more, she was having poor oral intake. She was taking half a dose of hydrocodone but more frequent at home.   Objective: Vitals:   02/09/23 0554 02/09/23 1427 02/09/23 2031 02/10/23 0705  BP: (!) 173/68 (!) 156/91 (!) 160/73 (!) 174/70  Pulse: 93 93 82 92  Resp: 20 19 16 18   Temp: 97.9 F (36.6 C) 97.7 F (36.5 C) 98 F (36.7 C) 98.2 F (36.8 C)  TempSrc: Oral Oral Oral Oral  SpO2: 93% 98% 96% 95%  Weight:      Height:        Intake/Output Summary (Last 24 hours) at 02/10/2023 0737 Last data filed at 02/10/2023 0300 Gross per 24 hour  Intake 897 ml  Output 150 ml  Net 747 ml   Filed Weights   02/04/23 2101 02/05/23 0437  Weight: 79.8 kg 80.2 kg    Examination:  General exam: Appears calm and comfortable  Respiratory system: Clear to auscultation. Respiratory effort normal. Cardiovascular system: S1 & S2 heard, IRR Gastrointestinal system: Abdomen is nondistended, soft and nontender. No organomegaly or masses felt. Normal bowel sounds heard. Central nervous system: Alert and oriented.  Extremities: Symmetric 5 x 5 power.    Data Reviewed: I have personally reviewed following labs and imaging studies  CBC: Recent Labs  Lab 02/04/23 2115 02/05/23 0309 02/05/23 0537 02/06/23 0259 02/07/23 0302  WBC 13.5* 14.4* 13.0* 14.3* 12.1*  NEUTROABS  --  11.9*  --   --   --   HGB 14.1 12.2 11.5* 10.8* 10.3*  HCT 47.2* 40.2 38.5 36.7 35.4*  MCV 70.4* 70.7* 70.9* 73.1* 74.5*  PLT 304 268 231 255 214   Basic Metabolic Panel: Recent Labs  Lab 02/05/23 0537  02/05/23 1552 02/06/23 0259 02/06/23 1532 02/07/23 0302 02/08/23 0259 02/09/23 0337 02/10/23 0429  NA 131*  --  133*  --  135 133* 135 136  K 2.1*   < > 2.9* 3.7 3.5 3.7 3.2* 3.4*  CL 85*  --  93*  --  96* 96* 97* 97*  CO2 34*  --  30  --  30 27 30 29   GLUCOSE 150*  --  140*  --  115* 118* 115* 118*  BUN 15  --  14  --  16 19 18 15   CREATININE 1.06*  --  1.20*  --  1.03* 1.02* 1.10* 1.09*  CALCIUM 8.3*  --  8.4*  --  8.7* 8.7* 8.7* 8.8*  MG 1.8  --  1.6*  --  2.2 1.8 1.7 1.8  PHOS 3.5  --  3.3  --   --   --   --   --    < > = values in this interval not  displayed.   GFR: Estimated Creatinine Clearance: 40.1 mL/min (A) (by C-G formula based on SCr of 1.09 mg/dL (H)). Liver Function Tests: Recent Labs  Lab 02/04/23 2201 02/05/23 0537 02/06/23 0259  AST 28 25 46*  ALT 18 17 21   ALKPHOS 56 43 43  BILITOT 0.8 0.9 0.9  PROT 8.3* 6.7 6.7  ALBUMIN 4.3 3.6 3.5   Recent Labs  Lab 02/04/23 2125  LIPASE 59*   Recent Labs  Lab 02/05/23 0308  AMMONIA 16   Coagulation Profile: No results for input(s): "INR", "PROTIME" in the last 168 hours. Cardiac Enzymes: Recent Labs  Lab 02/05/23 0309  CKTOTAL 72   BNP (last 3 results) No results for input(s): "PROBNP" in the last 8760 hours. HbA1C: No results for input(s): "HGBA1C" in the last 72 hours. CBG: Recent Labs  Lab 02/04/23 2151 02/05/23 0445 02/05/23 0915  GLUCAP 166* 142* 158*   Lipid Profile: No results for input(s): "CHOL", "HDL", "LDLCALC", "TRIG", "CHOLHDL", "LDLDIRECT" in the last 72 hours. Thyroid Function Tests: No results for input(s): "TSH", "T4TOTAL", "FREET4", "T3FREE", "THYROIDAB" in the last 72 hours. Anemia Panel: No results for input(s): "VITAMINB12", "FOLATE", "FERRITIN", "TIBC", "IRON", "RETICCTPCT" in the last 72 hours. Sepsis Labs: Recent Labs  Lab 02/04/23 2202 02/05/23 0314  LATICACIDVEN 3.2* 1.6    Recent Results (from the past 240 hour(s))  SARS Coronavirus 2 by RT PCR (hospital  order, performed in [facility] hospital lab) *cepheid single result test* Anterior Nasal Swab     Status: None   Collection Time: 02/05/23  2:44 AM   Specimen: Anterior Nasal Swab  Result Value Ref Range Status   SARS Coronavirus 2 by RT PCR NEGATIVE NEGATIVE Final    Comment: (NOTE) SARS-CoV-2 target nucleic acids are NOT DETECTED.  The SARS-CoV-2 RNA is generally detectable in upper and lower respiratory specimens during the acute phase of infection. The lowest concentration of SARS-CoV-2 viral copies this assay can detect is 250 copies / mL. A negative result does not preclude SARS-CoV-2 infection and should not be used as the sole basis for treatment or other patient management decisions.  A negative result may occur with improper specimen collection / handling, submission of specimen other than nasopharyngeal swab, presence of viral mutation(s) within the areas targeted by this assay, and inadequate number of viral copies (<250 copies / mL). A negative result must be combined with clinical observations, patient history, and epidemiological information.  Fact Sheet for Patients:   RoadLapTop.co.za  Fact Sheet for Healthcare Providers: http://kim-miller.com/  This test is not yet approved or  cleared by the [place] FDA and has been authorized for detection and/or diagnosis of SARS-CoV-2 by FDA under an Emergency Use Authorization (EUA).  This EUA will remain in effect (meaning this test can be used) for the duration of the COVID-19 declaration under Section 564(b)(1) of the Act, 21 U.S.C. section 360bbb-3(b)(1), unless the authorization is terminated or revoked sooner.  Performed at [facility], [address] [place], [place] [address]          Radiology Studies: DG ESOPHAGUS W SINGLE CM (SOL OR THIN BA)  Result Date: 02/09/2023 CLINICAL DATA:  Patient with difficulty swallowing.  Dysphagia. EXAM:  ESOPHOGRAM/BARIUM SWALLOW TECHNIQUE: Single contrast examination was performed using thin barium. 13 mm barium tablet was also utilized. FLUOROSCOPY: Radiation Exposure Index (as provided by the fluoroscopic device): 1 minute 30 seconds COMPARISON:  None Available. FINDINGS: Oral and pharyngeal phases are negative. No aspiration. No evidence of cervical  stricture. No fixed lesion of the thoracic esophagus. No hiatal hernia. Advanced soft a geode dysmotility. Poor primary stripping wave. Abnormal tertiary contractions. Stasis within the esophagus. Abnormal to and fro flow. No evidence of esophagitis or ulceration. 13.5 mm barium tablet passes after a few additional swallows, ruling out fixed stricture. IMPRESSION: 1. Advanced presbyesophagus/esophageal dysmotility. Poor primary stripping wave. Abnormal tertiary contractions. Stasis within the esophagus. Abnormal to and fro flow. 2. No evidence of esophagitis or ulceration. No hernia or fixed stricture. 3. 13.5 mm barium tablet passes after a few additional swallows, ruling out fixed stricture. Electronically Signed   By: Paulina Fusi M.D.   On: 02/09/2023 13:33   CT HEAD WO CONTRAST ( )  Result Date: 02/08/2023 CLINICAL DATA:  Altered mental status EXAM: CT HEAD WITHOUT CONTRAST TECHNIQUE: Contiguous axial images were obtained from the base of the skull through the vertex without intravenous contrast. RADIATION DOSE REDUCTION: This exam was performed according to the departmental dose-optimization program which includes automated exposure control, adjustment of the mA and/or kV according to patient size and/or use of iterative reconstruction technique. COMPARISON:  None Available. FINDINGS: Brain: There is no mass, hemorrhage or extra-axial collection. There is generalized atrophy without lobar predilection. Hypodensity of the white matter is most commonly associated with chronic microvascular disease. Vascular: Atherosclerotic calcification of the internal  carotid arteries at the skull base. No abnormal hyperdensity of the major intracranial arteries or dural venous sinuses. Skull: The visualized skull base, calvarium and extracranial soft tissues are normal. Sinuses/Orbits: No fluid levels or advanced mucosal thickening of the visualized paranasal sinuses. No mastoid or middle ear effusion. The orbits are normal. IMPRESSION: 1. No acute intracranial abnormality. 2. Generalized atrophy and findings of chronic microvascular disease. Electronically Signed   By: Deatra Robinson M.D.   On: 02/08/2023 19:34        Scheduled Meds:   stroke: early stages of recovery book   Does not apply Once   apixaban  2.5 mg Oral BID   atorvastatin  40 mg Oral Daily   Chlorhexidine Gluconate Cloth  6 each Topical Daily   citalopram  20 mg Oral Daily   feeding supplement  1 Container Oral BID BM   hydrALAZINE  25 mg Oral Q8H   isosorbide mononitrate  15 mg Oral Daily   metoprolol succinate  75 mg Oral Daily   ondansetron  4 mg Oral Q8H   Continuous Infusions:  thiamine (VITAMIN B1) injection Stopped (02/09/23 1045)     LOS: 5 days    Time spent: 35 minutes    Broc Caspers A Jenesys Casseus, MD [facility]   If 7PM-7AM, please contact night-coverage [email or web address]  02/10/2023, 7:37 AM

## 2023-02-10 NOTE — Plan of Care (Signed)
  Problem: Education: Goal: Knowledge of General Education information will improve Description: Including pain rating scale, medication(s)/side effects and non-pharmacologic comfort measures 02/10/2023 1730 by Dalphine Handing, RN Outcome: Progressing 02/10/2023 1726 by Dalphine Handing, RN Outcome: Progressing   Problem: Health Behavior/Discharge Planning: Goal: Ability to manage health-related needs will improve 02/10/2023 1730 by Dalphine Handing, RN Outcome: Progressing 02/10/2023 1726 by Dalphine Handing, RN Outcome: Progressing   Problem: Clinical Measurements: Goal: Ability to maintain clinical measurements within normal limits will improve 02/10/2023 1730 by Dalphine Handing, RN Outcome: Progressing 02/10/2023 1726 by Dalphine Handing, RN Outcome: Progressing

## 2023-02-10 NOTE — Progress Notes (Signed)
Subjective: Feeling well at this time.  Objective: Vital signs in last 24 hours: Temp:  [98 F (36.7 C)-98.2 F (36.8 C)] 98.1 F (36.7 C) (07/09 1301) Pulse Rate:  [69-92] 69 (07/09 1347) Resp:  [15-18] 15 (07/09 1301) BP: (155-174)/(66-81) 155/66 (07/09 1347) SpO2:  [95 %-99 %] 99 % (07/09 1301) Weight:  [82.1 kg] 82.1 kg (07/09 1358) Last BM Date : 02/08/23  Intake/Output from previous day: 07/08 0701 - 07/09 0700 In: 897 [P.O.:897] Out: 150 [Urine:150] Intake/Output this shift: No intake/output data recorded.  General appearance: alert and no distress Resp: clear to auscultation bilaterally Cardio: irregularly irregular rhythm GI: soft, non-tender; bowel sounds normal; no masses,  no organomegaly Extremities: extremities normal, atraumatic, no cyanosis or edema  Lab Results: Recent Labs    02/10/23 1321  WBC 11.4*  HGB 9.6*  HCT 32.9*  PLT 298   BMET Recent Labs    02/08/23 0259 02/09/23 0337 02/10/23 0429  NA 133* 135 136  K 3.7 3.2* 3.4*  CL 96* 97* 97*  CO2 27 30 29   GLUCOSE 118* 115* 118*  BUN 19 18 15   CREATININE 1.02* 1.10* 1.09*  CALCIUM 8.7* 8.7* 8.8*   LFT No results for input(s): "PROT", "ALBUMIN", "AST", "ALT", "ALKPHOS", "BILITOT", "BILIDIR", "IBILI" in the last 72 hours. PT/INR No results for input(s): "LABPROT", "INR" in the last 72 hours. Hepatitis Panel No results for input(s): "HEPBSAG", "HCVAB", "HEPAIGM", "HEPBIGM" in the last 72 hours. C-Diff No results for input(s): "CDIFFTOX" in the last 72 hours. Fecal Lactopherrin No results for input(s): "FECLLACTOFRN" in the last 72 hours.  Studies/Results: DG CHEST PORT 1 VIEW  Result Date: 02/10/2023 CLINICAL DATA:  Dyspnea. EXAM: PORTABLE CHEST 1 VIEW COMPARISON:  February 06, 2023. FINDINGS: Stable cardiomegaly. Status post coronary artery bypass graft. Minimal bibasilar subsegmental atelectasis is noted with possible small pleural effusions. Bony thorax unremarkable. IMPRESSION: Minimal  bibasilar subsegmental atelectasis is noted with possible small pleural effusions. Electronically Signed   By: Lupita Raider M.D.   On: 02/10/2023 12:40   DG ESOPHAGUS W SINGLE CM (SOL OR THIN BA)  Result Date: 02/09/2023 CLINICAL DATA:  Patient with difficulty swallowing.  Dysphagia. EXAM: ESOPHOGRAM/BARIUM SWALLOW TECHNIQUE: Single contrast examination was performed using thin barium. 13 mm barium tablet was also utilized. FLUOROSCOPY: Radiation Exposure Index (as provided by the fluoroscopic device): 1 minute 30 seconds COMPARISON:  None Available. FINDINGS: Oral and pharyngeal phases are negative. No aspiration. No evidence of cervical stricture. No fixed lesion of the thoracic esophagus. No hiatal hernia. Advanced soft a geode dysmotility. Poor primary stripping wave. Abnormal tertiary contractions. Stasis within the esophagus. Abnormal to and fro flow. No evidence of esophagitis or ulceration. 13.5 mm barium tablet passes after a few additional swallows, ruling out fixed stricture. IMPRESSION: 1. Advanced presbyesophagus/esophageal dysmotility. Poor primary stripping wave. Abnormal tertiary contractions. Stasis within the esophagus. Abnormal to and fro flow. 2. No evidence of esophagitis or ulceration. No hernia or fixed stricture. 3. 13.5 mm barium tablet passes after a few additional swallows, ruling out fixed stricture. Electronically Signed   By: Paulina Fusi M.D.   On: 02/09/2023 13:33   CT HEAD WO CONTRAST ( )  Result Date: 02/08/2023 CLINICAL DATA:  Altered mental status EXAM: CT HEAD WITHOUT CONTRAST TECHNIQUE: Contiguous axial images were obtained from the base of the skull through the vertex without intravenous contrast. RADIATION DOSE REDUCTION: This exam was performed according to the departmental dose-optimization program which includes automated exposure control, adjustment of the mA and/or  kV according to patient size and/or use of iterative reconstruction technique. COMPARISON:  None  Available. FINDINGS: Brain: There is no mass, hemorrhage or extra-axial collection. There is generalized atrophy without lobar predilection. Hypodensity of the white matter is most commonly associated with chronic microvascular disease. Vascular: Atherosclerotic calcification of the internal carotid arteries at the skull base. No abnormal hyperdensity of the major intracranial arteries or dural venous sinuses. Skull: The visualized skull base, calvarium and extracranial soft tissues are normal. Sinuses/Orbits: No fluid levels or advanced mucosal thickening of the visualized paranasal sinuses. No mastoid or middle ear effusion. The orbits are normal. IMPRESSION: 1. No acute intracranial abnormality. 2. Generalized atrophy and findings of chronic microvascular disease. Electronically Signed   By: Deatra Robinson M.D.   On: 02/08/2023 19:34    Medications: Scheduled:   stroke: early stages of recovery book   Does not apply Once   apixaban  2.5 mg Oral BID   atorvastatin  40 mg Oral Daily   Chlorhexidine Gluconate Cloth  6 each Topical Daily   citalopram  20 mg Oral Daily   empagliflozin  10 mg Oral Daily   feeding supplement  1 Container Oral BID BM   folic acid  1 mg Oral Daily   hydrALAZINE  25 mg Oral Q8H   isosorbide mononitrate  15 mg Oral Daily   metoprolol succinate  75 mg Oral Daily   ondansetron  4 mg Oral Q8H   pantoprazole  40 mg Oral BID   potassium chloride  20 mEq Oral BID   sacubitril-valsartan  1 tablet Oral BID   Continuous:  Assessment/Plan: 1) Nausea/vomiting. 2) HFpEF. 3) Afib.   The patient currently feels well, but her nausea and vomiting are chronic.  The etiology of this issue is not known and it is multifactorial.  The presbyesophagus noted with the esophagram, in my opinion, cannot explain the nausea/vomiting.  This is not a common finding and it is typically noted in patients with dysphagia complaints.  Her symptoms may be from a persistent volume overload.  The case  with discussed with Dr. Rennis Golden and he is working on improving her volume status.  Plan: 1) Diuresis per Dr. Rennis Golden. 2) Trial of Compazine.  LOS: 5 days   Eytan Carrigan D 02/10/2023, 5:37 PM

## 2023-02-10 NOTE — Care Management Important Message (Signed)
Important Message  Patient Details IM Letter given. Name: NEKIA MAXHAM MRN: [id number] Date of Birth: 08-02-1941   Medicare Important Message Given:  Yes     Caren Macadam 02/10/2023, 10:30 AM

## 2023-02-10 NOTE — Progress Notes (Signed)
Pt sleeping on and off for most of the nights with complaints of generalized pain and states "I just don't feel good" Pt assisted to BR, encouraged PO intake, and gave PRN pain meds.

## 2023-02-11 DIAGNOSIS — E8770 Fluid overload, unspecified: Secondary | ICD-10-CM

## 2023-02-11 DIAGNOSIS — I5032 Chronic diastolic (congestive) heart failure: Secondary | ICD-10-CM | POA: Diagnosis not present

## 2023-02-11 DIAGNOSIS — G9340 Encephalopathy, unspecified: Secondary | ICD-10-CM | POA: Diagnosis not present

## 2023-02-11 LAB — CBC
HCT: 35.3 % — ABNORMAL LOW (ref 36.0–46.0)
Hemoglobin: 10.2 g/dL — ABNORMAL LOW (ref 12.0–15.0)
MCH: 21.3 pg — ABNORMAL LOW (ref 26.0–34.0)
MCHC: 28.9 g/dL — ABNORMAL LOW (ref 30.0–36.0)
MCV: 73.7 fL — ABNORMAL LOW (ref 80.0–100.0)
Platelets: 331 10*3/uL (ref 150–400)
RBC: 4.79 MIL/uL (ref 3.87–5.11)
RDW: 18.7 % — ABNORMAL HIGH (ref 11.5–15.5)
WBC: 9.7 10*3/uL (ref 4.0–10.5)
nRBC: 0 % (ref 0.0–0.2)

## 2023-02-11 LAB — BASIC METABOLIC PANEL
Anion gap: 10 (ref 5–15)
BUN: 16 mg/dL (ref 8–23)
CO2: 28 mmol/L (ref 22–32)
Calcium: 8.5 mg/dL — ABNORMAL LOW (ref 8.9–10.3)
Chloride: 97 mmol/L — ABNORMAL LOW (ref 98–111)
Creatinine, Ser: 1.43 mg/dL — ABNORMAL HIGH (ref 0.44–1.00)
GFR, Estimated: 36 mL/min — ABNORMAL LOW (ref >60)
Glucose, Bld: 112 mg/dL — ABNORMAL HIGH (ref 70–99)
Potassium: 3.1 mmol/L — ABNORMAL LOW (ref 3.5–5.1)
Sodium: 135 mmol/L (ref 135–145)

## 2023-02-11 MED ORDER — FUROSEMIDE 40 MG PO TABS
40.0000 mg | ORAL_TABLET | Freq: Every day | ORAL | Status: DC
  Administered 2023-02-11: 40 mg via ORAL
  Filled 2023-02-11: qty 1

## 2023-02-11 MED ORDER — FUROSEMIDE 40 MG PO TABS: 40.0000 mg | ORAL_TABLET | Freq: Every day | ORAL | Status: DC | PRN

## 2023-02-11 NOTE — Plan of Care (Signed)
  Problem: Education: Goal: Knowledge of General Education information will improve Description: Including pain rating scale, medication(s)/side effects and non-pharmacologic comfort measures 02/11/2023 1618 by Dalphine Handing, RN Outcome: Progressing 02/11/2023 1617 by Dalphine Handing, RN Outcome: Progressing   Problem: Health Behavior/Discharge Planning: Goal: Ability to manage health-related needs will improve Outcome: Progressing   Problem: Clinical Measurements: Goal: Respiratory complications will improve Outcome: Progressing

## 2023-02-11 NOTE — TOC Initial Note (Addendum)
Transition of Care [facility]) - Initial/Assessment Note    Patient Details  Name: Kara Bush MRN: [id number] Date of Birth: June 29, 1941  Transition of Care [facility]) CM/SW Contact:    Catalina Gravel, LCSW Phone Number: 02/11/2023, 2:28 PM  Clinical Narrative:                 CSW assessed pt at bedside due to  readmission risk. Patient lives with grandson, she stays on the first floor, he lives on the second floor of the home.  She has about 5 stairs from her garage into the home. She is independent, does ADLS.  She will order in groceries sometimes, or they will bring food in. She has been driving herself to appointments.  For DME patient has a shower chair. Pt wants HHPT and CSW requested orders form MD.   Frances Furbish accepted Pt- text to [facility] with info and added to AVS. TOC to follow  Expected Discharge Plan: Home/Self Care Barriers to Discharge: Continued Medical Work up   Patient Goals and CMS Choice Patient states their goals for this hospitalization and ongoing recovery are:: Return home with [facility]   Choice offered to / list presented to : Patient      Expected Discharge Plan and Services                                              Prior Living Arrangements/Services                       Activities of Daily Living Home Assistive Devices/Equipment: Hearing aid, Eyeglasses, CPAP, Oxygen ADL Screening (condition at time of admission) Patient's cognitive ability adequate to safely complete daily activities?: Yes Is the patient deaf or have difficulty hearing?: Yes (wears hearing aids but does not have them here) Does the patient have difficulty seeing, even when wearing glasses/contacts?: No Does the patient have difficulty concentrating, remembering, or making decisions?: Yes (due to her hospitalization condition) Patient able to express need for assistance with ADLs?: Yes Does the patient have difficulty dressing or bathing?: No Independently performs  ADLs?: No Communication: Independent Dressing (OT): Needs assistance Is this a change from baseline?: Change from baseline, expected to last <3days Grooming: Independent Feeding: Independent Bathing: Needs assistance Is this a change from baseline?: Change from baseline, expected to last <3 days Toileting: Needs assistance Is this a change from baseline?: Change from baseline, expected to last <3 days In/Out Bed: Needs assistance Is this a change from baseline?: Change from baseline, expected to last <3 days Walks in Home: Needs assistance Is this a change from baseline?: Change from baseline, expected to last <3 days Does the patient have difficulty walking or climbing stairs?: Yes Weakness of Legs: Both Weakness of Arms/Hands: Both  Permission Sought/Granted                  Emotional Assessment              Admission diagnosis:  Dehydration [E86.0] Hypokalemia [E87.6] Hypomagnesemia [E83.42] Acute encephalopathy [G93.40] Nausea vomiting and diarrhea [R11.2, R19.7] Patient Active Problem List   Diagnosis Date Noted   Chronic heart failure with preserved ejection fraction (HFpEF) (HCC) 02/11/2023   Hypervolemia 02/11/2023   Acute encephalopathy 02/05/2023   Elevated troponin 02/05/2023   Hypokalemia 02/05/2023   Hypomagnesemia 02/05/2023   Intractable nausea and vomiting 02/05/2023  Prolonged QT interval 02/05/2023   Metabolic alkalosis 02/05/2023   Demand ischemia 11/24/2022   Chest pain 11/23/2022   Non-ST elevation (NSTEMI) myocardial infarction [facility]) 11/23/2022   Acute gastrointestinal bleeding 11/23/2022   Chronic combined systolic (congestive) and diastolic (congestive) heart failure (HCC) 11/23/2022   CKD (chronic kidney disease) stage 3, GFR 30-59 ml/min (HCC) 11/23/2022   DNR (do not resuscitate) 11/23/2022   A-fib (HCC) 02/26/2021   Atrial fibrillation (HCC) 09/09/2018   DOE (dyspnea on exertion) 08/10/2017   S/P CABG (coronary artery bypass  graft) 07/03/2016   Greater trochanteric bursitis of right hip 11/07/2014   Sacro-iliac pain 11/07/2014   Chronic midline low back pain without sciatica 11/07/2013   Hepatitis 04/20/2013   Screening for STD (sexually transmitted disease) 04/20/2013   Recurrent UTI 04/20/2013   Abdominal aortic aneurysm (HCC)    Ulcerative colitis (HCC)    Obstructive sleep apnea    Fibromyalgia    Coronary artery disease    Basal cell carcinoma    Symptomatic anemia 01/11/2013   Ascending aortic aneurysm, 4.1 cm 01/11/2013   Cervical spondylolysis 10/15/2011   Chronic periscapular pain 10/15/2011   Chest wall pain    S/P resection of aortic aneurysm, 1998 prior to CABG    PAD (peripheral artery disease), aortic-iliac bypass 1991, mild carotid bruits    OSA (obstructive sleep apnea)    Chronic fatigue syndrome with fibromyalgia    Hyperlipidemia    Ulcerative colitis    Depression    CAD (coronary artery disease), CABG 1998, STENTS MULTIPLE SITES SINCE.     Accelerated hypertension    PCP:  Jackelyn Poling, DO Pharmacy:   [id number] [place],  - [phone] [place] [address] Phone: [phone] Fax: [phone]     Social Determinants of Health (SDOH) Social History: SDOH Screenings   Food Insecurity: No Food Insecurity (02/08/2023)  Housing: Low Risk  (02/08/2023)  Transportation Needs: No Transportation Needs (02/08/2023)  Utilities: Not At Risk (02/08/2023)  Depression (PHQ2-9): Low Risk  (10/17/2022)  Tobacco Use: Medium Risk (02/04/2023)   SDOH Interventions:     Readmission Risk Interventions    02/11/2023    2:21 PM  Readmission Risk Prevention Plan  Transportation Screening Complete  PCP or Specialist Appt within 3-5 Days Complete  HRI or Home Care Consult Complete  Social Work Consult for Recovery Care Planning/Counseling Complete  Palliative Care Screening Not Applicable  Medication Review Ocean[employer]) Complete

## 2023-02-11 NOTE — Progress Notes (Signed)
PROGRESS NOTE    Kara Bush  WUJ:[id number] DOB: 24-May-1941 DOA: 02/04/2023 PCP: Jackelyn Poling, DO   Brief Narrative: 82 year old with past medical history significant for ulcerative colitis, CKD, ascending thoracic aneurysm who presented to the hospital with confusion, nausea, vomiting.  Recent admission in April for acute blood loss anemia requiring blood transfusion, endoscopy only showed hiatal hernia, colonoscopy showed stable UC.  Since discharge she has been having difficulty with poor oral intake, nausea, 10 pounds weight loss.  Intermittent diarrhea.  Patient has been confused for the last 2 months, worse over the last several days, with intermittent slurred speech and difficulty finding words.   Assessment & Plan:   Principal Problem:   Acute encephalopathy Active Problems:   Chest wall pain   PAD (peripheral artery disease), aortic-iliac bypass 1991, mild carotid bruits   OSA (obstructive sleep apnea)   Chronic fatigue syndrome with fibromyalgia   Accelerated hypertension   Ulcerative colitis (HCC)   Atrial fibrillation (HCC)   CKD (chronic kidney disease) stage 3, GFR 30-59 ml/min (HCC)   Elevated troponin   Hypokalemia   Hypomagnesemia   Intractable nausea and vomiting   Prolonged QT interval   Metabolic alkalosis   1-Intractable nausea and vomiting: Thought to be multifactorial, related to medication, polypharmacy.  On schedule Zofran.  Continue with  PPI BID.  She will need small, frequent meals.  Compazine PRN as recommended by Dr Elnoria Howard.  She feels better today. She was able to eat breakfast.   Presbyesophagus:  Small frequent meals.  PPI BID.   Acute on Chronic Diastolic Heart failure exacerbation  Dyspnea; report episodes of SOB on and off. Having episode today, on my assessment.  EKG obtain, afib,  Cardiology has been consulted.  Chest x ray: Minimal Bibasilar subsegmental atelectasis with small pleural effusion.  Received IV lasix  7/09 Entresto resume. Started on oral lasix.  Denies Dyspnea today   Acute CVA:  intermittent slurred speech and confusion.  Patient with report of intermittent slurred speech and occasional confusion over the last several days.  MRI showed small acute CVA.  Neurology was consulted.  Plan is to continue with Eliquis for now. LDL 80, A1c 5.6, CT angio without LVO.  2D echo unremarkable Received IV thiamine.   Elevation of troponin: On admission EKG showed some ST depression in the inferolateral leads, cardiology was consulted as a code STEMI however this was canceled as he was not felt to be a STEMI.  Case was discussed with Dr. Jens Som this is likely demand ischemia in the setting of acute illness.  Echo with normal ejection fraction and no wall motion abnormality.  Hypokalemia, hypomagnesemia: Replete orally   Chronic pain, fibromyalgia: Will continue with Hydrocodone every 4 Hours PRN.  At home she was taking half a tablet every 2 hours.  I will add half tablet hydrocodone for severe Pain TID PRN.  Vitamin D level. 53 not low.  She will need close follow up with pain clinic. Would avoid escalating opioids. Opioids can cause nausea.   PAD, history of aortoiliac bypass 1991  Ulcerative colitis: stable.  Insomnia; will try trazodone PRN . Ambien has been discontinue  PAF: on eliquis and metoprolol.   Anemia. Hb was 14 on admission, suspect hemoconcentration.  Last several days ago 11---10/  Hb two months ago was at 8.   Hb stable at 10./   Hypokalemia; replete orally.     Nutrition Problem: Inadequate oral intake Etiology: acute illness, nausea, vomiting, poor appetite  Signs/Symptoms: per patient/family report, energy intake < 75% for > or equal to 1 month    Interventions: Refer to RD note for recommendations  Estimated body mass index is 31.07 kg/m as calculated from the following:   Height as of this encounter: 5\' 4"  (1.626 m).   Weight as of this encounter:  82.1 kg.   DVT prophylaxis: eliquis Code Status: DNR Family Communication:Daughter at bedside 7/09.  Disposition Plan:  Status is: Inpatient Remains inpatient appropriate because: management of nausea, dyspnea. Plan to discharge tomorrow if stable.     Consultants:  Cardiology GI  Procedures:    Antimicrobials:    Subjective: She is feeling better. She was able to tolerates breakfast.  She is breathing better.   Objective: Vitals:   02/10/23 2213 02/11/23 0000 02/11/23 0454 02/11/23 0625  BP: 104/65 (!) 135/57 (!) 103/52 125/62  Pulse: 66 76 78 72  Resp:   20   Temp:   97.6 F (36.4 C)   TempSrc:   Oral   SpO2:   100%   Weight:      Height:        Intake/Output Summary (Last 24 hours) at 02/11/2023 0744 Last data filed at 02/11/2023 0520 Gross per 24 hour  Intake 1080 ml  Output --  Net 1080 ml    Filed Weights   02/04/23 2101 02/05/23 0437 02/10/23 1358  Weight: 79.8 kg 80.2 kg 82.1 kg    Examination:  General exam: NAD Respiratory system: CTA Cardiovascular system: S 1, S 2 IRR Gastrointestinal system: BS present, soft, nt Central nervous system: alert, conversant Extremities: no edema    Data Reviewed: I have personally reviewed following labs and imaging studies  CBC: Recent Labs  Lab 02/05/23 0309 02/05/23 0537 02/06/23 0259 02/07/23 0302 02/10/23 1321 02/11/23 0444  WBC 14.4* 13.0* 14.3* 12.1* 11.4* 9.7  NEUTROABS 11.9*  --   --   --   --   --   HGB 12.2 11.5* 10.8* 10.3* 9.6* 10.2*  HCT 40.2 38.5 36.7 35.4* 32.9* 35.3*  MCV 70.7* 70.9* 73.1* 74.5* 73.4* 73.7*  PLT 268 231 255 214 298 331    Basic Metabolic Panel: Recent Labs  Lab 02/05/23 0537 02/05/23 1552 02/06/23 0259 02/06/23 1532 02/07/23 0302 02/08/23 0259 02/09/23 0337 02/10/23 0429 02/11/23 0444  NA 131*  --  133*  --  135 133* 135 136 135  K 2.1*   < > 2.9*   < > 3.5 3.7 3.2* 3.4* 3.1*  CL 85*  --  93*  --  96* 96* 97* 97* 97*  CO2 34*  --  30  --  30 27  30 29 28   GLUCOSE 150*  --  140*  --  115* 118* 115* 118* 112*  BUN 15  --  14  --  16 19 18 15 16   CREATININE 1.06*  --  1.20*  --  1.03* 1.02* 1.10* 1.09* 1.43*  CALCIUM 8.3*  --  8.4*  --  8.7* 8.7* 8.7* 8.8* 8.5*  MG 1.8  --  1.6*  --  2.2 1.8 1.7 1.8  --   PHOS 3.5  --  3.3  --   --   --   --   --   --    < > = values in this interval not displayed.    GFR: Estimated Creatinine Clearance: 30.9 mL/min (A) (by C-G formula based on SCr of 1.43 mg/dL (H)). Liver Function Tests: Recent Labs  Lab 02/04/23  2201 02/05/23 0537 02/06/23 0259  AST 28 25 46*  ALT 18 17 21   ALKPHOS 56 43 43  BILITOT 0.8 0.9 0.9  PROT 8.3* 6.7 6.7  ALBUMIN 4.3 3.6 3.5    Recent Labs  Lab 02/04/23 2125  LIPASE 59*    Recent Labs  Lab 02/05/23 0308  AMMONIA 16    Coagulation Profile: No results for input(s): "INR", "PROTIME" in the last 168 hours. Cardiac Enzymes: Recent Labs  Lab 02/05/23 0309  CKTOTAL 72    BNP (last 3 results) No results for input(s): "PROBNP" in the last 8760 hours. HbA1C: No results for input(s): "HGBA1C" in the last 72 hours. CBG: Recent Labs  Lab 02/04/23 2151 02/05/23 0445 02/05/23 0915  GLUCAP 166* 142* 158*    Lipid Profile: No results for input(s): "CHOL", "HDL", "LDLCALC", "TRIG", "CHOLHDL", "LDLDIRECT" in the last 72 hours. Thyroid Function Tests: No results for input(s): "TSH", "T4TOTAL", "FREET4", "T3FREE", "THYROIDAB" in the last 72 hours. Anemia Panel: No results for input(s): "VITAMINB12", "FOLATE", "FERRITIN", "TIBC", "IRON", "RETICCTPCT" in the last 72 hours. Sepsis Labs: Recent Labs  Lab 02/04/23 2202 02/05/23 0314  LATICACIDVEN 3.2* 1.6     Recent Results (from the past 240 hour(s))  SARS Coronavirus 2 by RT PCR (hospital order, performed in [facility] hospital lab) *cepheid single result test* Anterior Nasal Swab     Status: None   Collection Time: 02/05/23  2:44 AM   Specimen: Anterior Nasal Swab  Result Value Ref Range  Status   SARS Coronavirus 2 by RT PCR NEGATIVE NEGATIVE Final    Comment: (NOTE) SARS-CoV-2 target nucleic acids are NOT DETECTED.  The SARS-CoV-2 RNA is generally detectable in upper and lower respiratory specimens during the acute phase of infection. The lowest concentration of SARS-CoV-2 viral copies this assay can detect is 250 copies / mL. A negative result does not preclude SARS-CoV-2 infection and should not be used as the sole basis for treatment or other patient management decisions.  A negative result may occur with improper specimen collection / handling, submission of specimen other than nasopharyngeal swab, presence of viral mutation(s) within the areas targeted by this assay, and inadequate number of viral copies (<250 copies / mL). A negative result must be combined with clinical observations, patient history, and epidemiological information.  Fact Sheet for Patients:   RoadLapTop.co.za  Fact Sheet for Healthcare Providers: http://kim-miller.com/  This test is not yet approved or  cleared by the [place] FDA and has been authorized for detection and/or diagnosis of SARS-CoV-2 by FDA under an Emergency Use Authorization (EUA).  This EUA will remain in effect (meaning this test can be used) for the duration of the COVID-19 declaration under Section 564(b)(1) of the Act, 21 U.S.C. section 360bbb-3(b)(1), unless the authorization is terminated or revoked sooner.  Performed at [facility], [address] [place], [place] [address]          Radiology Studies: DG CHEST PORT 1 VIEW  Result Date: 02/10/2023 CLINICAL DATA:  Dyspnea. EXAM: PORTABLE CHEST 1 VIEW COMPARISON:  February 06, 2023. FINDINGS: Stable cardiomegaly. Status post coronary artery bypass graft. Minimal bibasilar subsegmental atelectasis is noted with possible small pleural effusions. Bony thorax unremarkable. IMPRESSION: Minimal  bibasilar subsegmental atelectasis is noted with possible small pleural effusions. Electronically Signed   By: Lupita Raider M.D.   On: 02/10/2023 12:40   DG ESOPHAGUS W SINGLE CM (SOL OR THIN BA)  Result Date: 02/09/2023 CLINICAL DATA:  Patient with difficulty  swallowing.  Dysphagia. EXAM: ESOPHOGRAM/BARIUM SWALLOW TECHNIQUE: Single contrast examination was performed using thin barium. 13 mm barium tablet was also utilized. FLUOROSCOPY: Radiation Exposure Index (as provided by the fluoroscopic device): 1 minute 30 seconds COMPARISON:  None Available. FINDINGS: Oral and pharyngeal phases are negative. No aspiration. No evidence of cervical stricture. No fixed lesion of the thoracic esophagus. No hiatal hernia. Advanced soft a geode dysmotility. Poor primary stripping wave. Abnormal tertiary contractions. Stasis within the esophagus. Abnormal to and fro flow. No evidence of esophagitis or ulceration. 13.5 mm barium tablet passes after a few additional swallows, ruling out fixed stricture. IMPRESSION: 1. Advanced presbyesophagus/esophageal dysmotility. Poor primary stripping wave. Abnormal tertiary contractions. Stasis within the esophagus. Abnormal to and fro flow. 2. No evidence of esophagitis or ulceration. No hernia or fixed stricture. 3. 13.5 mm barium tablet passes after a few additional swallows, ruling out fixed stricture. Electronically Signed   By: Paulina Fusi M.D.   On: 02/09/2023 13:33        Scheduled Meds:   stroke: early stages of recovery book   Does not apply Once   apixaban  2.5 mg Oral BID   atorvastatin  40 mg Oral Daily   Chlorhexidine Gluconate Cloth  6 each Topical Daily   citalopram  20 mg Oral Daily   empagliflozin  10 mg Oral Daily   feeding supplement  1 Container Oral BID BM   folic acid  1 mg Oral Daily   hydrALAZINE  25 mg Oral Q8H   isosorbide mononitrate  15 mg Oral Daily   metoprolol succinate  75 mg Oral Daily   ondansetron  4 mg Oral Q8H   pantoprazole  40 mg  Oral BID   potassium chloride  20 mEq Oral BID   sacubitril-valsartan  1 tablet Oral BID   Continuous Infusions:     LOS: 6 days    Time spent: 35 minutes    Kara Bush A Kara Angerer, MD [facility]   If 7PM-7AM, please contact night-coverage [email or web address]  02/11/2023, 7:44 AM

## 2023-02-11 NOTE — Progress Notes (Signed)
Rounding Note    Patient Name: Kara Bush Date of Encounter: 02/11/2023  [facility] Cardiologist: Chrystie Nose, MD   Subjective   Feeling better today.  Not requiring any supplemental oxygen and no longer short of breath  Inpatient Medications    Scheduled Meds:   stroke: early stages of recovery book   Does not apply Once   apixaban  2.5 mg Oral BID   atorvastatin  40 mg Oral Daily   Chlorhexidine Gluconate Cloth  6 each Topical Daily   citalopram  20 mg Oral Daily   empagliflozin  10 mg Oral Daily   feeding supplement  1 Container Oral BID BM   folic acid  1 mg Oral Daily   hydrALAZINE  25 mg Oral Q8H   isosorbide mononitrate  15 mg Oral Daily   metoprolol succinate  75 mg Oral Daily   ondansetron  4 mg Oral Q8H   pantoprazole  40 mg Oral BID   potassium chloride  20 mEq Oral BID   sacubitril-valsartan  1 tablet Oral BID   Continuous Infusions:  PRN Meds: acetaminophen, albuterol, HYDROcodone-acetaminophen, HYDROcodone-acetaminophen, metoprolol tartrate, prochlorperazine, traZODone   Vital Signs    Vitals:   02/10/23 2213 02/11/23 0000 02/11/23 0454 02/11/23 0625  BP: 104/65 (!) 135/57 (!) 103/52 125/62  Pulse: 66 76 78 72  Resp:   20   Temp:   97.6 F (36.4 C)   TempSrc:   Oral   SpO2:   100%   Weight:      Height:        Intake/Output Summary (Last 24 hours) at 02/11/2023 0910 Last data filed at 02/11/2023 0520 Gross per 24 hour  Intake 960 ml  Output --  Net 960 ml      02/10/2023    1:58 PM 02/05/2023    4:37 AM 02/04/2023    9:01 PM  Last 3 Weights  Weight (lbs) 181 lb 176 lb 12.9 oz 176 lb  Weight (kg) 82.101 kg 80.2 kg 79.833 kg      Telemetry    Atrial fibrillation heart rate 80s- Personally Reviewed  ECG    No new tracings- Personally Reviewed  Physical Exam   GEN: No acute distress.   Neck: No JVD Cardiac: Irregularly irregular Respiratory: Clear to auscultation bilaterally. GI: Soft, nontender,  non-distended  MS: No edema; No deformity. Neuro:  Nonfocal  Psych: Normal affect   Labs    High Sensitivity Troponin:   Recent Labs  Lab 02/04/23 2327 02/05/23 0309 02/05/23 0537 02/05/23 1430 02/06/23 0711  TROPONINIHS 44* 43* 91* 633* 735*     Chemistry Recent Labs  Lab 02/04/23 2201 02/05/23 0537 02/05/23 1552 02/06/23 0259 02/06/23 1532 02/08/23 0259 02/09/23 0337 02/10/23 0429 02/11/23 0444  NA  --  131*  --  133*   < > 133* 135 136 135  K  --  2.1*   < > 2.9*   < > 3.7 3.2* 3.4* 3.1*  CL  --  85*  --  93*   < > 96* 97* 97* 97*  CO2  --  34*  --  30   < > 27 30 29 28   GLUCOSE  --  150*  --  140*   < > 118* 115* 118* 112*  BUN  --  15  --  14   < > 19 18 15 16   CREATININE  --  1.06*  --  1.20*   < > 1.02*  1.10* 1.09* 1.43*  CALCIUM  --  8.3*  --  8.4*   < > 8.7* 8.7* 8.8* 8.5*  MG  --  1.8  --  1.6*   < > 1.8 1.7 1.8  --   PROT 8.3* 6.7  --  6.7  --   --   --   --   --   ALBUMIN 4.3 3.6  --  3.5  --   --   --   --   --   AST 28 25  --  46*  --   --   --   --   --   ALT 18 17  --  21  --   --   --   --   --   ALKPHOS 56 43  --  43  --   --   --   --   --   BILITOT 0.8 0.9  --  0.9  --   --   --   --   --   GFRNONAA  --  52*  --  45*   < > 55* 50* 50* 36*  ANIONGAP  --  12  --  10   < > 10 8 10 10    < > = values in this interval not displayed.    Lipids  Recent Labs  Lab 02/06/23 0259  CHOL 144  TRIG 92  HDL 46  LDLCALC 80  CHOLHDL 3.1    Hematology Recent Labs  Lab 02/07/23 0302 02/10/23 1321 02/11/23 0444  WBC 12.1* 11.4* 9.7  RBC 4.75 4.48 4.79  HGB 10.3* 9.6* 10.2*  HCT 35.4* 32.9* 35.3*  MCV 74.5* 73.4* 73.7*  MCH 21.7* 21.4* 21.3*  MCHC 29.1* 29.2* 28.9*  RDW 18.4* 18.7* 18.7*  PLT 214 298 331   Thyroid  Recent Labs  Lab 02/05/23 0310  TSH 0.733    BNP Recent Labs  Lab 02/10/23 1321  BNP 739.7*    DDimer No results for input(s): "DDIMER" in the last 168 hours.   Radiology    DG CHEST PORT 1 VIEW  Result Date:  02/10/2023 CLINICAL DATA:  Dyspnea. EXAM: PORTABLE CHEST 1 VIEW COMPARISON:  February 06, 2023. FINDINGS: Stable cardiomegaly. Status post coronary artery bypass graft. Minimal bibasilar subsegmental atelectasis is noted with possible small pleural effusions. Bony thorax unremarkable. IMPRESSION: Minimal bibasilar subsegmental atelectasis is noted with possible small pleural effusions. Electronically Signed   By: Lupita Raider M.D.   On: 02/10/2023 12:40   DG ESOPHAGUS W SINGLE CM (SOL OR THIN BA)  Result Date: 02/09/2023 CLINICAL DATA:  Patient with difficulty swallowing.  Dysphagia. EXAM: ESOPHOGRAM/BARIUM SWALLOW TECHNIQUE: Single contrast examination was performed using thin barium. 13 mm barium tablet was also utilized. FLUOROSCOPY: Radiation Exposure Index (as provided by the fluoroscopic device): 1 minute 30 seconds COMPARISON:  None Available. FINDINGS: Oral and pharyngeal phases are negative. No aspiration. No evidence of cervical stricture. No fixed lesion of the thoracic esophagus. No hiatal hernia. Advanced soft a geode dysmotility. Poor primary stripping wave. Abnormal tertiary contractions. Stasis within the esophagus. Abnormal to and fro flow. No evidence of esophagitis or ulceration. 13.5 mm barium tablet passes after a few additional swallows, ruling out fixed stricture. IMPRESSION: 1. Advanced presbyesophagus/esophageal dysmotility. Poor primary stripping wave. Abnormal tertiary contractions. Stasis within the esophagus. Abnormal to and fro flow. 2. No evidence of esophagitis or ulceration. No hernia or fixed stricture. 3. 13.5 mm barium tablet passes after a few additional  swallows, ruling out fixed stricture. Electronically Signed   By: Paulina Fusi M.D.   On: 02/09/2023 13:33    Cardiac Studies   Echocardiogram 02/05/2023  1. Left ventricular ejection fraction, by estimation, is 55 to 60%. The  left ventricle has normal function. The left ventricle has no regional  wall motion abnormalities.  Left ventricular diastolic parameters are  indeterminate.   2. Right ventricular systolic function is normal. The right ventricular  size is normal. There is normal pulmonary artery systolic pressure.   3. Left atrial size was mildly dilated.   4. The mitral valve is degenerative. Mild mitral valve regurgitation. No  evidence of mitral stenosis.   5. Partial fusion of right and left cusps. The aortic valve is tricuspid.  There is moderate calcification of the aortic valve. There is moderate  thickening of the aortic valve. Aortic valve regurgitation is mild.  Moderate aortic valve stenosis.   6. The inferior vena cava is normal in size with greater than 50%  respiratory variability, suggesting right atrial pressure of 3 mmHg.   Patient Profile     82 y.o. female hx of CAD s/p CABG x 3  (LIMA-LAD, SVG-Diag, and SVG-RCA) in 1998 s/p multiple percutaneous interventions, paroxysmal atrial fibrillation on Eliquis, bicuspid aortic valve with mild to moderate regurgitation, pulmonary embolism, thoracic aortic aneurysm, chronic diastolic heart failure, TIA, PAD s/p aortoiliac bypass, hypertension, hyperlipidemia, OSA, and recent admission for acute blood loss anemia.  Currently being evaluated for dyspnea.  Assessment & Plan    Dyspnea Chronic HFpEF Patient with minimal complaints of dyspnea, now resolved after 40 mg of IV Lasix yesterday.  Off supplemental o2. She has known chronic HFpEF and likely volume up from correction of dehydration.  Echocardiogram during this admission shows normal LVEF, no significant changes.  Had elevated BNP in the 700s, supporting pathology as above.  Will switch back home to home diuresis regiment.  PTA p.o. Lasix 40 mg every other day as needed Continue to give potassium as needed  Continue jardiance 10mg . No previous issues with yeast infections. Counseled on side effects.  Continue Toprol XL 75mg  and resuming home entresto 97-103.   Paroxysmal atrial  fibrillation Patient has fluctuated in and out of atrial fibrillation and NSR throughout this admission and at last office visit.  Given recent GI bleed in April hospitalization, plan is to continue reduced dose of Eliquis 2.5 mg twice daily, then reevaluate at upcoming appointment in July.  Currently in A-fib right now with controlled rates around 80-90 Continue Eliquis 2.5 mg and Toprol-XL 75 mg   CAD status post CABG in 1998 was multiple subsequent stenting Elevated troponins Patient was reviewed by Dr. Jens Som who felt to be demand ischemia.  I agree with this.  Continue outpatient workup with PET stress test. Continue Imdur 15 mg, BB, statin, Zetia   Hypertension Added back to [facility] yesterday.  Will defer amlodipine once blood pressure allows.   Hyperlipidemia Continue atorvastatin 40 mg and zetia.    Moderate aortic valve stenosis.  Continue to monitor.    Anemia Recently admitted due to acute blood loss anemia requiring multiple transfusions.  Hemoglobin on admission 14.1.  Continues to downtrend since admission.  Currently 10.3.  Discussed with primary team, felt to falsy elevated on admission due to dehydration.    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  Nausea and vomiting.  Barium swallow test indicating dysmotility. GI following CKD stage 3   For questions or  updates, please contact [facility] Please consult [email or web address] for contact info under        Signed, Abagail Kitchens, PA-C  02/11/2023, 9:10 AM

## 2023-02-11 NOTE — Plan of Care (Signed)
  Problem: Education: Goal: Knowledge of General Education information will improve Description: Including pain rating scale, medication(s)/side effects and non-pharmacologic comfort measures Outcome: Progressing   Problem: Clinical Measurements: Goal: Ability to maintain clinical measurements within normal limits will improve Outcome: Progressing Goal: Cardiovascular complication will be avoided Outcome: Progressing   Problem: Safety: Goal: Ability to remain free from injury will improve Outcome: Progressing   

## 2023-02-11 NOTE — Progress Notes (Signed)
Subjective: Seems to doing very well today.  She denies any problems with nausea or abdominal pain.  She is tolerating her diet well.  She is scheduled to be discharged tomorrow.  Objective: Vital signs in last 24 hours: Temp:  [97.6 F (36.4 C)-98.3 F (36.8 C)] 98.3 F (36.8 C) (07/10 1203) Pulse Rate:  [61-78] 75 (07/10 1316) Resp:  [20-22] 21 (07/10 1203) BP: (93-135)/(46-88) 110/88 (07/10 1316) SpO2:  [93 %-100 %] 100 % (07/10 1203) Last BM Date : 02/08/23  Intake/Output from previous day: 07/09 0701 - 07/10 0700 In: 1080 [P.O.:1080] Out: -  Intake/Output this shift: Total I/O In: 357 [P.O.:357] Out: 200 [Urine:200]  General appearance: alert, cooperative, appears stated age, fatigued, and no distress Resp: clear to auscultation bilaterally Cardio: Irregularly irregular rate and rhythm, S1, S2 normal, no murmur, click, rub or gallop GI: soft, non-tender; bowel sounds normal; no masses,  no organomegaly  Lab Results: Recent Labs    02/10/23 1321 02/11/23 0444  WBC 11.4* 9.7  HGB 9.6* 10.2*  HCT 32.9* 35.3*  PLT 298 331   BMET Recent Labs    02/09/23 0337 02/10/23 0429 02/11/23 0444  NA 135 136 135  K 3.2* 3.4* 3.1*  CL 97* 97* 97*  CO2 30 29 28   GLUCOSE 115* 118* 112*  BUN 18 15 16   CREATININE 1.10* 1.09* 1.43*  CALCIUM 8.7* 8.8* 8.5*   Studies/Results: DG CHEST PORT 1 VIEW  Result Date: 02/10/2023 CLINICAL DATA:  Dyspnea. EXAM: PORTABLE CHEST 1 VIEW COMPARISON:  February 06, 2023. FINDINGS: Stable cardiomegaly. Status post coronary artery bypass graft. Minimal bibasilar subsegmental atelectasis is noted with possible small pleural effusions. Bony thorax unremarkable. IMPRESSION: Minimal bibasilar subsegmental atelectasis is noted with possible small pleural effusions. Electronically Signed   By: Lupita Raider M.D.   On: 02/10/2023 12:40    Medications: I have reviewed the patient's current medications. Prior to Admission:  Medications Prior to Admission   Medication Sig Dispense Refill Last Dose   amLODipine (NORVASC) 5 MG tablet TAKE ONE TABLET BY MOUTH ONCE daily 90 tablet 3 02/04/2023   apixaban (ELIQUIS) 2.5 MG TABS tablet Take 1 tablet (2.5 mg total) by mouth 2 (two) times daily. 60 tablet 11 02/04/2023 at Unknown   b complex vitamins capsule Take 1 capsule by mouth daily.   02/04/2023   ezetimibe (ZETIA) 10 MG tablet TAKE ONE TABLET BY MOUTH ONCE daily 90 tablet 3 02/04/2023   furosemide (LASIX) 40 MG tablet Take 1 tablet (40 mg total) by mouth 3 (three) times a week. Increase to every day as needed for weight gain of 2lbs overnight or 5lbs in 1 week (Patient taking differently: Take 40 mg by mouth See admin instructions. Take 1 tablet (40mg ) 3 times weekly and Increase to every day as needed for weight gain of 2lbs overnight or 5lbs in 1 week.) 90 tablet 1 02/04/2023   HYDROcodone-acetaminophen (NORCO/VICODIN) 5-325 MG tablet Take 1 tablet by mouth 4 (four) times daily as needed for moderate pain. May Take an extra 0.5 to one tablet when pain is severe. (Patient taking differently: Take 0.5 tablets by mouth every 2 (two) hours.) 140 tablet 0 02/04/2023   isosorbide mononitrate (IMDUR) 30 MG 24 hr tablet Take 0.5 tablets (15 mg total) by mouth daily. 45 tablet 3 02/04/2023   metoprolol succinate (TOPROL-XL) 50 MG 24 hr tablet TAKE ONE TABLET BY MOUTH ONCE daily 90 tablet 1 02/04/2023   Multiple Vitamin (MULITIVITAMIN WITH MINERALS) TABS Take 1 tablet  by mouth daily.   02/04/2023   ondansetron (ZOFRAN) 4 MG tablet Take 4 mg by mouth every 8 (eight) hours as needed for nausea or vomiting.   unknown   potassium chloride (KLOR-CON M) 10 MEQ tablet Take 1 tablet (10 mEq total) by mouth 3 (three) times a week. And with extra lasix as needed. (Patient taking differently: Take 10 mEq by mouth See admin instructions. Take 1 tablet ( ) 3 times weekly. Take with extra lasix as needed.) 90 tablet 3 02/04/2023   zolpidem (AMBIEN CR) 12.5 MG CR tablet Take 12.5 mg by mouth at  bedtime.   02/03/2023   atorvastatin (LIPITOR) 20 MG tablet Take 1 tablet (20 mg total) by mouth daily. (Patient not taking: Reported on 02/07/2023) 30 tablet 1 Not Taking   betamethasone dipropionate 0.05 % cream Apply 1 Application topically daily. (Patient not taking: Reported on 02/07/2023)   Not Taking   citalopram (CELEXA) 20 MG tablet Take 1 tablet by mouth daily. (Patient not taking: Reported on 02/07/2023)   Not Taking   clobetasol ointment (TEMOVATE) 0.05 % Apply 1 Application topically 2 (two) times daily. (Patient not taking: Reported on 02/07/2023)   Not Taking   ferrous sulfate 325 (65 FE) MG EC tablet Take 325 mg by mouth daily. (Patient not taking: Reported on 02/07/2023)   Not Taking   methocarbamol (ROBAXIN) 500 MG tablet TAKE 1 TABLET BY MOUTH EVERY 8 HOURS AS NEEDED FOR MUSCLE SPASM (Patient not taking: Reported on 02/07/2023) 30 tablet 1 Not Taking   sacubitril-valsartan (ENTRESTO) 97-103 MG Take 1 tablet by mouth 2 (two) times daily. (Patient not taking: Reported on 02/07/2023)   Not Taking   Scheduled:   stroke: early stages of recovery book   Does not apply Once   apixaban  2.5 mg Oral BID   atorvastatin  40 mg Oral Daily   Chlorhexidine Gluconate Cloth  6 each Topical Daily   citalopram  20 mg Oral Daily   empagliflozin  10 mg Oral Daily   feeding supplement  1 Container Oral BID BM   folic acid  1 mg Oral Daily   furosemide  40 mg Oral Daily   hydrALAZINE  25 mg Oral Q8H   isosorbide mononitrate  15 mg Oral Daily   metoprolol succinate  75 mg Oral Daily   ondansetron  4 mg Oral Q8H   pantoprazole  40 mg Oral BID   potassium chloride  20 mEq Oral BID   sacubitril-valsartan  1 tablet Oral BID   Continuous:  Assessment/Plan: 1) Nausea off-and-on without any vomiting but with some hypersalivation with the nausea is severe ongoing for the last 1 year-her daughter feels it might be due to multiple medications that she is taking [amlodipine and Eliquis can cause nausea] but I feel  this might be related to her headaches chronic pain and fibromyalgia-significantly improved with Compazine. 2) Chronic pain with fibromyalgia with intermittent slurred speech and mental status changes-MRI of the brain shows small acute infarct in the left parietal subcortical white matter with no acute intracranial abnormalities  3) Ulcerative colitis-currently stable. 4) Severe hypokalemia-resolved. 5) Colonic diverticulosis. 6) Aneurysm of the ascending thoracic aorta. 5) PAD with history of aorto-iliac bypass 1991 6) PAF. 7) CKD.  8) Elevated troponins. 9) Chronic HFpEF 10) CAD/HTN/hyperlipidemia-s/p CABG with multiple stenting 11) Moderate aortic valve stenosis. 12) anemia of chronic disease. Charna Elizabeth 02/06/2023, 6:58 AM  Routing Hi   LOS: 6 days   Charna Elizabeth 02/11/2023, 2:58 PM

## 2023-02-11 NOTE — Progress Notes (Signed)
Nutrition Follow-up  DOCUMENTATION CODES:   Obesity unspecified  INTERVENTION:  - Advance diet to Regular to avoid restricting intake. Discussed with Dr. Sunnie Nielsen. - Continue Boost Breeze po BID, each supplement provides 250 kcal and 9 grams of protein - Monitor weight trends.   NUTRITION DIAGNOSIS:   Inadequate oral intake related to acute illness, nausea, vomiting, poor appetite as evidenced by per patient/family report, energy intake < 75% for > or equal to 1 month. *ongoing  GOAL:   Patient will meet greater than or equal to 90% of their needs *progressing  MONITOR:   PO intake, Diet advancement, Labs, Weight trends  REASON FOR ASSESSMENT:   Consult Assessment of nutrition requirement/status  ASSESSMENT:   82 y.o. female with PMH significant of ulcerative colitis, CKD, fusiform aneurysm of ascending thoracic aorta who presented with  confusion, nausea and vomiting. Admitted for acute encephalopathy.  Patient reports she has not been eating well as she continues to have some regurgitation. Another issues is that she does not like the food, states it is very bland. Also reports she is limited on the soft diet.  Appetite is fair and thankfully she has been drinking Boost Breeze twice daily and enjoys it.   Patient has desire to have diet advanced to Regular to avoid being restricted. 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  Discussed that she would need to choose foods she could tolerate and get down. She endorses understanding and feels she knows what she could tolerate.  Discussed changing diet to Regular with Dr. Sunnie Nielsen, who was agreeable with plan.   Medications reviewed and include: 1mg  folic acid, Zofran Q8H  Labs reviewed:  K+ 3.1 Creatinine 1.43   Diet Order:   Diet Order             Diet regular Room service appropriate? Yes; Fluid consistency: Thin  Diet effective now                   EDUCATION NEEDS:  Education needs have been  addressed  Skin:  Skin Assessment: Reviewed RN Assessment  Last BM:  7/7  Height:  Ht Readings from Last 1 Encounters:  02/04/23 5\' 4"  (1.626 m)   Weight:  Wt Readings from Last 1 Encounters:  02/10/23 82.1 kg    BMI:  Body mass index is 31.07 kg/m.  Estimated Nutritional Needs:  Kcal:  1750-1900 kcals Protein:  80-95 grams Fluid:  >/= 1.7L    Shelle Iron RD, LDN For contact information, refer to [facility].

## 2023-02-12 ENCOUNTER — Encounter: Payer: PPO | Admitting: Registered Nurse

## 2023-02-12 ENCOUNTER — Telehealth: Payer: Self-pay | Admitting: Cardiology

## 2023-02-12 DIAGNOSIS — I5032 Chronic diastolic (congestive) heart failure: Secondary | ICD-10-CM | POA: Diagnosis not present

## 2023-02-12 DIAGNOSIS — G9340 Encephalopathy, unspecified: Secondary | ICD-10-CM | POA: Diagnosis not present

## 2023-02-12 DIAGNOSIS — I5033 Acute on chronic diastolic (congestive) heart failure: Secondary | ICD-10-CM

## 2023-02-12 LAB — CBC
HCT: 32.6 % — ABNORMAL LOW (ref 36.0–46.0)
Hemoglobin: 9.6 g/dL — ABNORMAL LOW (ref 12.0–15.0)
MCH: 21.8 pg — ABNORMAL LOW (ref 26.0–34.0)
MCHC: 29.4 g/dL — ABNORMAL LOW (ref 30.0–36.0)
MCV: 73.9 fL — ABNORMAL LOW (ref 80.0–100.0)
Platelets: 295 10*3/uL (ref 150–400)
RBC: 4.41 MIL/uL (ref 3.87–5.11)
RDW: 19 % — ABNORMAL HIGH (ref 11.5–15.5)
WBC: 9.4 10*3/uL (ref 4.0–10.5)
nRBC: 0 % (ref 0.0–0.2)

## 2023-02-12 LAB — BASIC METABOLIC PANEL
Anion gap: 9 (ref 5–15)
BUN: 24 mg/dL — ABNORMAL HIGH (ref 8–23)
CO2: 26 mmol/L (ref 22–32)
Calcium: 8.3 mg/dL — ABNORMAL LOW (ref 8.9–10.3)
Chloride: 100 mmol/L (ref 98–111)
Creatinine, Ser: 1.98 mg/dL — ABNORMAL HIGH (ref 0.44–1.00)
GFR, Estimated: 25 mL/min — ABNORMAL LOW (ref >60)
Glucose, Bld: 113 mg/dL — ABNORMAL HIGH (ref 70–99)
Potassium: 3.3 mmol/L — ABNORMAL LOW (ref 3.5–5.1)
Sodium: 135 mmol/L (ref 135–145)

## 2023-02-12 MED ORDER — POTASSIUM CHLORIDE CRYS ER 20 MEQ PO TBCR
20.0000 meq | EXTENDED_RELEASE_TABLET | Freq: Once | ORAL | Status: AC
  Administered 2023-02-12: 20 meq via ORAL
  Filled 2023-02-12: qty 1

## 2023-02-12 NOTE — Telephone Encounter (Signed)
Currently admitted, will need call once discharged

## 2023-02-12 NOTE — Progress Notes (Signed)
Physical Therapy Treatment Patient Details Name: Kara Bush MRN: [id number] DOB: 04-03-1941 Today's Date: 02/12/2023   History of Present Illness Patient is a 82 year old female who presented on 02/04/23 with poor oral intake, progressive nausea, increased confusion, and intermittent slurred speech. Patient found to have small L parietal acute CVA, PMH: fibromyalgia, AAA, CAD s/p CABG, chronic fatigue/chronic pain, HLD, HTN.    PT Comments  Pt ambulated 100' + 80' (with seated rest break) without an assistive device, without loss of balance, HR 106. Pt notes that she fatigues more quickly than she did at baseline. Pt performed seated BUE/LE strengthening exercises. She is mobilizing well enough to DC home from a PT standpoint.     Assistance Recommended at Discharge Intermittent Supervision/Assistance  If plan is discharge home, recommend the following:  Can travel by private vehicle    A little help with bathing/dressing/bathroom;Assistance with cooking/housework;Assist for transportation;Help with stairs or ramp for entrance      Equipment Recommendations  None recommended by PT    Recommendations for Other Services       Precautions / Restrictions Precautions Precautions: Fall Restrictions Weight Bearing Restrictions: No     Mobility  Bed Mobility               General bed mobility comments: patient was up in recliner and returned to the same    Transfers Overall transfer level: Modified independent Equipment used: None Transfers: Sit to/from Stand Sit to Stand: Modified independent (Device/Increase time)           General transfer comment: used arm rest for sit to stand x 2    Ambulation/Gait Ambulation/Gait assistance: Supervision Gait Distance (Feet): 100 Feet Assistive device: None Gait Pattern/deviations: Step-through pattern, Decreased stride length Gait velocity: decreased     General Gait Details: 100' + 80', no AD, no loss of  balance, 1 standing and 1 seated rest break 2* fatigue. HR 106   Stairs             Wheelchair Mobility     Tilt Bed    Modified Rankin (Stroke Patients Only)       Balance Overall balance assessment: Mild deficits observed, not formally tested Sitting-balance support: Feet supported, No upper extremity supported Sitting balance-Leahy Scale: Good       Standing balance-Leahy Scale: Good                              Cognition Arousal/Alertness: Awake/alert Behavior During Therapy: WFL for tasks assessed/performed Overall Cognitive Status: Within Functional Limits for tasks assessed                                 General Comments: oriented, appropriate conversation, answered questions appropriately, followed commands, good self monitoring and asking for rest breaks as needed        Exercises General Exercises - Lower Extremity Long Arc Quad: AROM, 10 reps, Both, Seated Hip Flexion/Marching: AROM, Both, 10 reps, Seated Shoulder Exercises Pendulum Exercise: AROM, Both, 10 reps, Seated    General Comments        Pertinent Vitals/Pain Pain Assessment Pain Score: 5  Pain Location: upper back and "all over" d/t fibromyalgia Pain Descriptors / Indicators: Aching Pain Intervention(s): Limited activity within patient's tolerance, Premedicated before session, Patient requesting pain meds-RN notified, Monitored during session    Home Living  Prior Function            PT Goals (current goals can now be found in the care plan section) Acute Rehab PT Goals Patient Stated Goal: to be able to do stairs PT Goal Formulation: With patient Time For Goal Achievement: 02/21/23 Potential to Achieve Goals: Good Progress towards PT goals: Progressing toward goals    Frequency    Min 1X/week      PT Plan Current plan remains appropriate    Co-evaluation              AM-PAC PT "6 Clicks"  Mobility   Outcome Measure  Help needed turning from your back to your side while in a flat bed without using bedrails?: None Help needed moving from lying on your back to sitting on the side of a flat bed without using bedrails?: None Help needed moving to and from a bed to a chair (including a wheelchair)?: None Help needed standing up from a chair using your arms (e.g., wheelchair or bedside chair)?: None Help needed to walk in hospital room?: None Help needed climbing 3-5 steps with a railing? : A Little 6 Click Score: 23    End of Session Equipment Utilized During Treatment: Gait belt Activity Tolerance: Patient tolerated treatment well Patient left: in bed;with bed alarm set;with call bell/phone within reach Nurse Communication: Mobility status PT Visit Diagnosis: Unsteadiness on feet (R26.81);Difficulty in walking, not elsewhere classified (R26.2)     Time: 1610-9604 PT Time Calculation (min) (ACUTE ONLY): 10 min  Charges:    $Gait Training: 8-22 mins PT General Charges $$ ACUTE PT VISIT: 1 Visit                     Ralene Bathe Kistler PT 02/12/2023  Acute Rehabilitation Services  Office [phone]

## 2023-02-12 NOTE — Progress Notes (Signed)
Rounding Note    Patient Name: Kara Bush Date of Encounter: 02/12/2023  [facility] Cardiologist: Chrystie Nose, MD   Subjective   NO complaints.   Inpatient Medications    Scheduled Meds:   stroke: early stages of recovery book   Does not apply Once   apixaban  2.5 mg Oral BID   atorvastatin  40 mg Oral Daily   citalopram  20 mg Oral Daily   empagliflozin  10 mg Oral Daily   feeding supplement  1 Container Oral BID BM   folic acid  1 mg Oral Daily   hydrALAZINE  25 mg Oral Q8H   isosorbide mononitrate  15 mg Oral Daily   metoprolol succinate  75 mg Oral Daily   ondansetron  4 mg Oral Q8H   pantoprazole  40 mg Oral BID   Continuous Infusions:  PRN Meds: acetaminophen, albuterol, HYDROcodone-acetaminophen, HYDROcodone-acetaminophen, metoprolol tartrate, prochlorperazine, traZODone   Vital Signs    Vitals:   02/11/23 2004 02/12/23 0444 02/12/23 0449 02/12/23 0933  BP: (!) 113/55 (!) 99/46  (!) 95/55  Pulse: 74 (!) 56  75  Resp: 16 17    Temp: 98.3 F (36.8 C) 98.6 F (37 C)    TempSrc: Oral Oral    SpO2: 97% 95%    Weight:   83.9 kg   Height:        Intake/Output Summary (Last 24 hours) at 02/12/2023 0955 Last data filed at 02/12/2023 0836 Gross per 24 hour  Intake 717 ml  Output 700 ml  Net 17 ml      02/12/2023    4:49 AM 02/10/2023    1:58 PM 02/05/2023    4:37 AM  Last 3 Weights  Weight (lbs) 184 lb 15.5 oz 181 lb 176 lb 12.9 oz  Weight (kg) 83.9 kg 82.101 kg 80.2 kg      Telemetry    Atrial fibrillation heart rate 80s- Personally Reviewed  ECG    No new tracings- Personally Reviewed  Physical Exam   GEN: No acute distress.   Neck: No JVD Cardiac: Irregularly irregular Respiratory: Clear to auscultation bilaterally. GI: Soft, nontender, non-distended  MS: No edema; No deformity. Neuro:  Nonfocal  Psych: Normal affect   Labs    High Sensitivity Troponin:   Recent Labs  Lab 02/04/23 2327 02/05/23 0309  02/05/23 0537 02/05/23 1430 02/06/23 0711  TROPONINIHS 44* 43* 91* 633* 735*     Chemistry Recent Labs  Lab 02/06/23 0259 02/06/23 1532 02/08/23 0259 02/09/23 0337 02/10/23 0429 02/11/23 0444 02/12/23 0424  NA 133*   < > 133* 135 136 135 135  K 2.9*   < > 3.7 3.2* 3.4* 3.1* 3.3*  CL 93*   < > 96* 97* 97* 97* 100  CO2 30   < > 27 30 29 28 26   GLUCOSE 140*   < > 118* 115* 118* 112* 113*  BUN 14   < > 19 18 15 16  24*  CREATININE 1.20*   < > 1.02* 1.10* 1.09* 1.43* 1.98*  CALCIUM 8.4*   < > 8.7* 8.7* 8.8* 8.5* 8.3*  MG 1.6*   < > 1.8 1.7 1.8  --   --   PROT 6.7  --   --   --   --   --   --   ALBUMIN 3.5  --   --   --   --   --   --   AST 46*  --   --   --   --   --   --  ALT 21  --   --   --   --   --   --   ALKPHOS 43  --   --   --   --   --   --   BILITOT 0.9  --   --   --   --   --   --   GFRNONAA 45*   < > 55* 50* 50* 36* 25*  ANIONGAP 10   < > 10 8 10 10 9    < > = values in this interval not displayed.    Lipids  Recent Labs  Lab 02/06/23 0259  CHOL 144  TRIG 92  HDL 46  LDLCALC 80  CHOLHDL 3.1    Hematology Recent Labs  Lab 02/10/23 1321 02/11/23 0444 02/12/23 0424  WBC 11.4* 9.7 9.4  RBC 4.48 4.79 4.41  HGB 9.6* 10.2* 9.6*  HCT 32.9* 35.3* 32.6*  MCV 73.4* 73.7* 73.9*  MCH 21.4* 21.3* 21.8*  MCHC 29.2* 28.9* 29.4*  RDW 18.7* 18.7* 19.0*  PLT 298 331 295   Thyroid  No results for input(s): "TSH", "FREET4" in the last 168 hours.   BNP Recent Labs  Lab 02/10/23 1321  BNP 739.7*    DDimer No results for input(s): "DDIMER" in the last 168 hours.   Radiology    DG CHEST PORT 1 VIEW  Result Date: 02/10/2023 CLINICAL DATA:  Dyspnea. EXAM: PORTABLE CHEST 1 VIEW COMPARISON:  February 06, 2023. FINDINGS: Stable cardiomegaly. Status post coronary artery bypass graft. Minimal bibasilar subsegmental atelectasis is noted with possible small pleural effusions. Bony thorax unremarkable. IMPRESSION: Minimal bibasilar subsegmental atelectasis is noted with  possible small pleural effusions. Electronically Signed   By: Lupita Raider M.D.   On: 02/10/2023 12:40    Cardiac Studies   Echocardiogram 02/05/2023  1. Left ventricular ejection fraction, by estimation, is 55 to 60%. The  left ventricle has normal function. The left ventricle has no regional  wall motion abnormalities. Left ventricular diastolic parameters are  indeterminate.   2. Right ventricular systolic function is normal. The right ventricular  size is normal. There is normal pulmonary artery systolic pressure.   3. Left atrial size was mildly dilated.   4. The mitral valve is degenerative. Mild mitral valve regurgitation. No  evidence of mitral stenosis.   5. Partial fusion of right and left cusps. The aortic valve is tricuspid.  There is moderate calcification of the aortic valve. There is moderate  thickening of the aortic valve. Aortic valve regurgitation is mild.  Moderate aortic valve stenosis.   6. The inferior vena cava is normal in size with greater than 50%  respiratory variability, suggesting right atrial pressure of 3 mmHg.   Patient Profile     82 y.o. female hx of CAD s/p CABG x 3  (LIMA-LAD, SVG-Diag, and SVG-RCA) in 1998 s/p multiple percutaneous interventions, paroxysmal atrial fibrillation on Eliquis, bicuspid aortic valve with mild to moderate regurgitation, pulmonary embolism, thoracic aortic aneurysm, chronic diastolic heart failure, TIA, PAD s/p aortoiliac bypass, hypertension, hyperlipidemia, OSA, and recent admission for acute blood loss anemia.  Currently being evaluated for dyspnea.  Assessment & Plan    Dyspnea Chronic HFpEF Patient with minimal complaints of dyspnea, now resolved after 40 mg of IV Lasix yesterday.  Off supplemental o2. She has known chronic HFpEF and was likely volume up from correction of dehydration.  Echocardiogram during this admission shows normal LVEF, no significant changes.   Feeling back to baseline. Has  AKI now, Cr 1.4-1.98  today. Stopped entresto and lasix. Likely can be DC. Will need to check renal function 1 week post DC. Will set up follow up. Continue jardiance 10mg . No previous issues with yeast infections. Counseled on side effects.  Continue Toprol XL 75mg . Continue to supplement potassium. Should improve now off diuretics.    Paroxysmal atrial fibrillation Patient has fluctuated in and out of atrial fibrillation and NSR throughout this admission and at last office visit.  Given recent GI bleed in April hospitalization, plan is to continue reduced dose of Eliquis 2.5 mg twice daily, then reevaluate at upcoming appointment in July.  Currently in A-fib right now with controlled rates around 80-90 Continue Eliquis 2.5 mg and Toprol-XL 75 mg   CAD status post CABG in 1998 was multiple subsequent stenting Elevated troponins Patient was reviewed by Dr. Jens Som who felt to be demand ischemia.  I agree with this.  Continue outpatient workup with PET stress test. Continue Imdur 15 mg, BB, statin, Zetia   Hypertension Added back to [facility] yesterday.  Will defer amlodipine once blood pressure allows.   Hyperlipidemia Continue atorvastatin 40 mg and zetia.    Moderate aortic valve stenosis.  Continue to monitor.    Anemia Recently admitted due to acute blood loss anemia requiring multiple transfusions.  Looks to be stable currently. 9.6.   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  Nausea and vomiting.  Barium swallow test indicating dysmotility. GI following. Resolved.  CKD stage 3   For questions or updates, please contact [facility] Please consult [email or web address] for contact info under        Signed, Abagail Kitchens, PA-C  02/12/2023, 9:55 AM

## 2023-02-12 NOTE — Progress Notes (Signed)
PROGRESS NOTE    Kara Bush  JXB:[id number] DOB: 03-30-1941 DOA: 02/04/2023 PCP: Jackelyn Poling, DO   Brief Narrative: 82 year old with past medical history significant for ulcerative colitis, CKD, ascending thoracic aneurysm who presented to the hospital with confusion, nausea, vomiting.  Recent admission in April for acute blood loss anemia requiring blood transfusion, endoscopy only showed hiatal hernia, colonoscopy showed stable UC.  Since discharge she has been having difficulty with poor oral intake, nausea, 10 pounds weight loss.  Intermittent diarrhea.  Patient has been confused for the last 2 months, worse over the last several days, with intermittent slurred speech and difficulty finding words.   Assessment & Plan:   Principal Problem:   Acute encephalopathy Active Problems:   Chest wall pain   PAD (peripheral artery disease), aortic-iliac bypass 1991, mild carotid bruits   OSA (obstructive sleep apnea)   Chronic fatigue syndrome with fibromyalgia   Accelerated hypertension   Ulcerative colitis (HCC)   Atrial fibrillation (HCC)   CKD (chronic kidney disease) stage 3, GFR 30-59 ml/min (HCC)   Elevated troponin   Hypokalemia   Hypomagnesemia   Intractable nausea and vomiting   Prolonged QT interval   Metabolic alkalosis   Chronic heart failure with preserved ejection fraction (HFpEF) (HCC)   Hypervolemia   1-Intractable nausea and vomiting: Thought to be multifactorial, related to medication, polypharmacy.  On schedule Zofran.  Continue with  PPI BID.  She will need small, frequent meals.  Compazine PRN as recommended by Dr Elnoria Howard.  Nausea improved, tolerating diet today.   Presbyesophagus:  Small frequent meals.  PPI BID.   Acute on Chronic Diastolic Heart failure exacerbation  Dyspnea; report episodes of SOB on and off. Having episode today, on my assessment.  EKG obtain, afib,  Cardiology has been consulted.  Chest x ray: Minimal Bibasilar subsegmental  atelectasis with small pleural effusion.  Received IV lasix 7/09 Cr increase to 1.9--plan to hold entresto, lasix/ Repeat cr in am.   Acute CVA:  intermittent slurred speech and confusion.  Patient with report of intermittent slurred speech and occasional confusion over the last several days.  MRI showed small acute CVA.  Neurology was consulted.  Plan is to continue with Eliquis for now. LDL 80, A1c 5.6, CT angio without LVO.  2D echo unremarkable Received IV thiamine.   AKI; Cr peak to 1.9 in setting Diuretics and hypotension.  Hold multiples diuretics.  Repeat labs in am.   Elevation of troponin: On admission EKG showed some ST depression in the inferolateral leads, cardiology was consulted as a code STEMI however this was canceled as he was not felt to be a STEMI.  Case was discussed with Dr. Jens Som this is likely demand ischemia in the setting of acute illness.  Echo with normal ejection fraction and no wall motion abnormality.  Hypokalemia, hypomagnesemia: Replete orally.   Chronic pain, fibromyalgia: Will continue with Hydrocodone every 4 Hours PRN.  At home she was taking half a tablet every 2 hours.  I will add half tablet hydrocodone for severe Pain TID PRN.  Vitamin D level. 53 not low.  She will need close follow up with pain clinic. Would avoid escalating opioids. Opioids can cause nausea.   PAD, history of aortoiliac bypass 1991  Ulcerative colitis: stable.  Insomnia; will try trazodone PRN . Ambien has been discontinue  PAF: on eliquis and metoprolol.   Anemia. Hb was 14 on admission, suspect hemoconcentration.  Last several days ago 11---10/  Hb two months  ago was at 8.   Hb stable at 10./   Hypokalemia; replete orally.      Nutrition Problem: Inadequate oral intake Etiology: acute illness, nausea, vomiting, poor appetite    Signs/Symptoms: per patient/family report, energy intake < 75% for > or equal to 1 month    Interventions: Refer to RD  note for recommendations  Estimated body mass index is 31.75 kg/m as calculated from the following:   Height as of this encounter: 5\' 4"  (1.626 m).   Weight as of this encounter: 83.9 kg.   DVT prophylaxis: eliquis Code Status: DNR Family Communication:Daughter over phone 7/11 Disposition Plan:  Status is: Inpatient Remains inpatient appropriate because: management of nausea, dyspnea. Plan to discharge tomorrow if stable.     Consultants:  Cardiology GI  Procedures:    Antimicrobials:    Subjective: She is in good spirit. She has been able to eat, nausea has improved.   Objective: Vitals:   02/12/23 0449 02/12/23 0933 02/12/23 1246 02/12/23 1443  BP:  (!) 95/55 133/74 118/72  Pulse:  75 84   Resp:   16   Temp:   98.6 F (37 C)   TempSrc:   Oral   SpO2:   95%   Weight: 83.9 kg     Height:        Intake/Output Summary (Last 24 hours) at 02/12/2023 1505 Last data filed at 02/12/2023 1413 Gross per 24 hour  Intake 1160 ml  Output 600 ml  Net 560 ml   Filed Weights   02/05/23 0437 02/10/23 1358 02/12/23 0449  Weight: 80.2 kg 82.1 kg 83.9 kg    Examination:  General exam: NAD Respiratory system: CTA Cardiovascular system: S 1, S 2 RRR Gastrointestinal system: BS present, soft, nt Central nervous system: Alert, conversant  Extremities: No edema    Data Reviewed: I have personally reviewed following labs and imaging studies  CBC: Recent Labs  Lab 02/06/23 0259 02/07/23 0302 02/10/23 1321 02/11/23 0444 02/12/23 0424  WBC 14.3* 12.1* 11.4* 9.7 9.4  HGB 10.8* 10.3* 9.6* 10.2* 9.6*  HCT 36.7 35.4* 32.9* 35.3* 32.6*  MCV 73.1* 74.5* 73.4* 73.7* 73.9*  PLT 255 214 298 331 295   Basic Metabolic Panel: Recent Labs  Lab 02/06/23 0259 02/06/23 1532 02/07/23 0302 02/08/23 0259 02/09/23 0337 02/10/23 0429 02/11/23 0444 02/12/23 0424  NA 133*  --  135 133* 135 136 135 135  K 2.9*   < > 3.5 3.7 3.2* 3.4* 3.1* 3.3*  CL 93*  --  96* 96* 97* 97*  97* 100  CO2 30  --  30 27 30 29 28 26   GLUCOSE 140*  --  115* 118* 115* 118* 112* 113*  BUN 14  --  16 19 18 15 16  24*  CREATININE 1.20*  --  1.03* 1.02* 1.10* 1.09* 1.43* 1.98*  CALCIUM 8.4*  --  8.7* 8.7* 8.7* 8.8* 8.5* 8.3*  MG 1.6*  --  2.2 1.8 1.7 1.8  --   --   PHOS 3.3  --   --   --   --   --   --   --    < > = values in this interval not displayed.   GFR: Estimated Creatinine Clearance: 22.6 mL/min (A) (by C-G formula based on SCr of 1.98 mg/dL (H)). Liver Function Tests: Recent Labs  Lab 02/06/23 0259  AST 46*  ALT 21  ALKPHOS 43  BILITOT 0.9  PROT 6.7  ALBUMIN 3.5   No  results for input(s): "LIPASE", "AMYLASE" in the last 168 hours. No results for input(s): "AMMONIA" in the last 168 hours. Coagulation Profile: No results for input(s): "INR", "PROTIME" in the last 168 hours. Cardiac Enzymes: No results for input(s): "CKTOTAL", "CKMB", "CKMBINDEX", "TROPONINI" in the last 168 hours. BNP (last 3 results) No results for input(s): "PROBNP" in the last 8760 hours. HbA1C: No results for input(s): "HGBA1C" in the last 72 hours. CBG: No results for input(s): "GLUCAP" in the last 168 hours. Lipid Profile: No results for input(s): "CHOL", "HDL", "LDLCALC", "TRIG", "CHOLHDL", "LDLDIRECT" in the last 72 hours. Thyroid Function Tests: No results for input(s): "TSH", "T4TOTAL", "FREET4", "T3FREE", "THYROIDAB" in the last 72 hours. Anemia Panel: No results for input(s): "VITAMINB12", "FOLATE", "FERRITIN", "TIBC", "IRON", "RETICCTPCT" in the last 72 hours. Sepsis Labs: No results for input(s): "PROCALCITON", "LATICACIDVEN" in the last 168 hours.  Recent Results (from the past 240 hour(s))  SARS Coronavirus 2 by RT PCR (hospital order, performed in [facility] hospital lab) *cepheid single result test* Anterior Nasal Swab     Status: None   Collection Time: 02/05/23  2:44 AM   Specimen: Anterior Nasal Swab  Result Value Ref Range Status   SARS Coronavirus 2 by RT PCR  NEGATIVE NEGATIVE Final    Comment: (NOTE) SARS-CoV-2 target nucleic acids are NOT DETECTED.  The SARS-CoV-2 RNA is generally detectable in upper and lower respiratory specimens during the acute phase of infection. The lowest concentration of SARS-CoV-2 viral copies this assay can detect is 250 copies / mL. A negative result does not preclude SARS-CoV-2 infection and should not be used as the sole basis for treatment or other patient management decisions.  A negative result may occur with improper specimen collection / handling, submission of specimen other than nasopharyngeal swab, presence of viral mutation(s) within the areas targeted by this assay, and inadequate number of viral copies (<250 copies / mL). A negative result must be combined with clinical observations, patient history, and epidemiological information.  Fact Sheet for Patients:   RoadLapTop.co.za  Fact Sheet for Healthcare Providers: http://kim-miller.com/  This test is not yet approved or  cleared by the [place] FDA and has been authorized for detection and/or diagnosis of SARS-CoV-2 by FDA under an Emergency Use Authorization (EUA).  This EUA will remain in effect (meaning this test can be used) for the duration of the COVID-19 declaration under Section 564(b)(1) of the Act, 21 U.S.C. section 360bbb-3(b)(1), unless the authorization is terminated or revoked sooner.  Performed at [facility], [address], [place], [place] [address]          Radiology Studies: No results found.      Scheduled Meds:   stroke: early stages of recovery book   Does not apply Once   apixaban  2.5 mg Oral BID   atorvastatin  40 mg Oral Daily   citalopram  20 mg Oral Daily   empagliflozin  10 mg Oral Daily   feeding supplement  1 Container Oral BID BM   folic acid  1 mg Oral Daily   hydrALAZINE  25 mg Oral Q8H   isosorbide mononitrate  15 mg Oral  Daily   metoprolol succinate  75 mg Oral Daily   ondansetron  4 mg Oral Q8H   pantoprazole  40 mg Oral BID   potassium chloride  20 mEq Oral Once   Continuous Infusions:     LOS: 7 days    Time spent: 35 minutes  Alba Cory, MD [facility]   If 7PM-7AM, please contact night-coverage [email or web address]  02/12/2023, 3:05 PM

## 2023-02-12 NOTE — Plan of Care (Signed)
  Problem: Education: Goal: Knowledge of General Education information will improve Description: Including pain rating scale, medication(s)/side effects and non-pharmacologic comfort measures Outcome: Progressing   Problem: Activity: Goal: Risk for activity intolerance will decrease Outcome: Progressing   Problem: Nutrition: Goal: Adequate nutrition will be maintained Outcome: Progressing   Problem: Coping: Goal: Level of anxiety will decrease Outcome: Progressing   Problem: Elimination: Goal: Will not experience complications related to urinary retention Outcome: Progressing   Problem: Pain Managment: Goal: General experience of comfort will improve Outcome: Progressing   Problem: Safety: Goal: Ability to remain free from injury will improve Outcome: Progressing   Problem: Skin Integrity: Goal: Risk for impaired skin integrity will decrease Outcome: Progressing   Problem: Education: Goal: Knowledge of disease or condition will improve Outcome: Progressing   Problem: Ischemic Stroke/TIA Tissue Perfusion: Goal: Complications of ischemic stroke/TIA will be minimized Outcome: Progressing   Problem: Coping: Goal: Will identify appropriate support needs Outcome: Progressing

## 2023-02-12 NOTE — Progress Notes (Deleted)
Subjective:    Patient ID: Kara Bush, female    DOB: 10/21/1940, 82 y.o.   MRN: [id number]  HPI   Pain Inventory Average Pain {NUMBERS; 0-10:5044} Pain Right Now {NUMBERS; 0-10:5044} My pain is {PAIN DESCRIPTION:21022940}  In the last 24 hours, has pain interfered with the following? General activity {NUMBERS; 0-10:5044} Relation with others {NUMBERS; 0-10:5044} Enjoyment of life {NUMBERS; 0-10:5044} What TIME of day is your pain at its worst? {time of day:24191} Sleep (in general) {BHH GOOD/FAIR/POOR:22877}  Pain is worse with: {ACTIVITIES:21022942} Pain improves with: {PAIN IMPROVES UYQI:34742595} Relief from Meds: {NUMBERS; 0-10:5044}  Family History  Problem Relation Age of Onset   Heart disease Father    Heart attack Father        @ 6   Cancer Paternal Aunt        BREAST   Heart failure Mother        at 100   Healthy Sister    Cancer Brother        pancreatic    Heart failure Maternal Grandmother    Heart attack Maternal Grandfather    Heart attack Paternal Grandmother        at 53   Healthy Daughter    Healthy Daughter    Social History   Socioeconomic History   Marital status: Divorced    Spouse name: Not on file   Number of children: 2   Years of education: college    Highest education level: Not on file  Occupational History    Employer: RETIRED  Tobacco Use   Smoking status: Former    Current packs/day: 0.00    Average packs/day: 2.5 packs/day for 20.0 years (50.0 ttl pk-yrs)    Types: Cigarettes    Start date: 06/29/1970    Quit date: 06/29/1990    Years since quitting: 32.6    Passive exposure: Never   Smokeless tobacco: Never  Vaping Use   Vaping status: Never Used  Substance and Sexual Activity   Alcohol use: No   Drug use: No   Sexual activity: Not on file  Other Topics Concern   Not on file  Social History Narrative   Not on file   Social Determinants of Health   Financial Resource Strain: Not on file  Food  Insecurity: No Food Insecurity (02/08/2023)   Hunger Vital Sign    Worried About Running Out of Food in the Last Year: Never true    Ran Out of Food in the Last Year: Never true  Transportation Needs: No Transportation Needs (02/08/2023)   PRAPARE - Administrator, Civil Service (Medical): No    Lack of Transportation (Non-Medical): No  Physical Activity: Not on file  Stress: Not on file  Social Connections: Not on file   Past Surgical History:  Procedure Laterality Date   AORTOBIEXTERNAL ILIAC BYPASS  1991   BIOPSY  11/25/2022   Procedure: BIOPSY;  Surgeon: Jeani Hawking, MD;  Location: [facility] ENDOSCOPY;  Service: Gastroenterology;;   CATARACT EXTRACTION Left 09/2019   CHOLECYSTECTOMY  1997   COLONOSCOPY WITH PROPOFOL N/A 11/25/2022   Procedure: COLONOSCOPY WITH PROPOFOL;  Surgeon: Jeani Hawking, MD;  Location: [facility] ENDOSCOPY;  Service: Gastroenterology;  Laterality: N/A;   CORONARY ANGIOPLASTY WITH STENT PLACEMENT  2006   to LAD, ATRETIC LIMA AT THAT TIME ALSO   CORONARY ANGIOPLASTY WITH STENT PLACEMENT  2007   STENT TO PROX VG-DIAG, OTHER STENTS PATENT   CORONARY ARTERY BYPASS GRAFT  1998  LIMA-LAD;VG-DIAG & RCA   CORONARY STENT PLACEMENT  2005   TO LAD & LCX-STENTS,   ESOPHAGOGASTRODUODENOSCOPY (EGD) WITH PROPOFOL N/A 11/25/2022   Procedure: ESOPHAGOGASTRODUODENOSCOPY (EGD) WITH PROPOFOL;  Surgeon: Jeani Hawking, MD;  Location: [facility] ENDOSCOPY;  Service: Gastroenterology;  Laterality: N/A;   HERNIA REPAIR     TUBAL LIGATION     BTL   Past Surgical History:  Procedure Laterality Date   AORTOBIEXTERNAL ILIAC BYPASS  1991   BIOPSY  11/25/2022   Procedure: BIOPSY;  Surgeon: Jeani Hawking, MD;  Location: [facility] ENDOSCOPY;  Service: Gastroenterology;;   CATARACT EXTRACTION Left 09/2019   CHOLECYSTECTOMY  1997   COLONOSCOPY WITH PROPOFOL N/A 11/25/2022   Procedure: COLONOSCOPY WITH PROPOFOL;  Surgeon: Jeani Hawking, MD;  Location: [facility] ENDOSCOPY;  Service: Gastroenterology;  Laterality:  N/A;   CORONARY ANGIOPLASTY WITH STENT PLACEMENT  2006   to LAD, ATRETIC LIMA AT THAT TIME ALSO   CORONARY ANGIOPLASTY WITH STENT PLACEMENT  2007   STENT TO PROX VG-DIAG, OTHER STENTS PATENT   CORONARY ARTERY BYPASS GRAFT  1998   LIMA-LAD;VG-DIAG & RCA   CORONARY STENT PLACEMENT  2005   TO LAD & LCX-STENTS,   ESOPHAGOGASTRODUODENOSCOPY (EGD) WITH PROPOFOL N/A 11/25/2022   Procedure: ESOPHAGOGASTRODUODENOSCOPY (EGD) WITH PROPOFOL;  Surgeon: Jeani Hawking, MD;  Location: [facility] ENDOSCOPY;  Service: Gastroenterology;  Laterality: N/A;   HERNIA REPAIR     TUBAL LIGATION     BTL   Past Medical History:  Diagnosis Date   Abdominal aortic aneurysm (HCC)    Anemia, improved 01/11/2013   Basal cell carcinoma    CAD (coronary artery disease)    hx CABG 1998, last cath 2007 with PCI and stenting to the proximal segment of the vein graft to the diagonal branch. DES Taxus was placed   Cervicalgia    Chronic fatigue fibromyalgia syndrome    Chronic pain syndrome    Depression    Fibromyalgia    History of nuclear stress test 08/07/2011   lexiscan; no evidence of ischemia or infarct; low risk    Hyperlipidemia    Hypertension    OSA (obstructive sleep apnea)    PAD (peripheral artery disease) (HCC)    Pes anserine bursitis    Pulmonary embolism (HCC) 01/11/2013   S/P resection of aortic aneurysm    Trochanteric bursitis    Ulcerative colitis (HCC)    There were no vitals taken for this visit.  Opioid Risk Score:   Fall Risk Score:  `1  Depression screen PHQ 2/9     10/17/2022    1:38 PM 08/18/2022    1:15 PM 06/23/2022    1:03 PM 02/19/2022    1:40 PM 10/21/2021   11:08 AM 06/24/2021   10:48 AM 04/23/2021   10:41 AM  Depression screen PHQ 2/9  Decreased Interest 0 0 1 0 0 0 0  Down, Depressed, Hopeless 0 0 1 0 0 0 0  PHQ - 2 Score 0 0 2 0 0 0 0    Review of Systems     Objective:   Physical Exam        Assessment & Plan:

## 2023-02-12 NOTE — Telephone Encounter (Signed)
   Transition of Care Follow-up Phone Call Request    Patient Name: [name] Date of Birth: Aug 24, 1940 Date of Encounter: 02/12/2023  Primary Care Provider:  Jackelyn Poling, DO Primary Cardiologist:  Chrystie Nose, MD  Kara Bush has been scheduled for a transition of care follow up appointment with a [facility] provider:  Tuesday Feb 24, 2023  Arrive by 8:10 AMAppt at 8:25 AM (25 min)  With Kara Bush. Plans for DC soon.  Has BMP to complete one week from DC  Please reach out to Kara Bush within 48 hours to confirm appointment and review transition of care protocol questionnaire.  Abagail Kitchens, PA-C  02/12/2023, 10:13 AM

## 2023-02-13 ENCOUNTER — Inpatient Hospital Stay (HOSPITAL_COMMUNITY): Payer: PPO

## 2023-02-13 DIAGNOSIS — N183 Chronic kidney disease, stage 3 unspecified: Secondary | ICD-10-CM

## 2023-02-13 DIAGNOSIS — E8770 Fluid overload, unspecified: Secondary | ICD-10-CM | POA: Diagnosis not present

## 2023-02-13 DIAGNOSIS — N179 Acute kidney failure, unspecified: Secondary | ICD-10-CM | POA: Diagnosis not present

## 2023-02-13 DIAGNOSIS — G9340 Encephalopathy, unspecified: Secondary | ICD-10-CM | POA: Diagnosis not present

## 2023-02-13 DIAGNOSIS — I5032 Chronic diastolic (congestive) heart failure: Secondary | ICD-10-CM | POA: Diagnosis not present

## 2023-02-13 LAB — URINALYSIS, ROUTINE W REFLEX MICROSCOPIC
Bilirubin Urine: NEGATIVE
Glucose, UA: 50 mg/dL — AB
Hgb urine dipstick: NEGATIVE
Ketones, ur: NEGATIVE mg/dL
Nitrite: NEGATIVE
Protein, ur: NEGATIVE mg/dL
Specific Gravity, Urine: 1.009 (ref 1.005–1.030)
pH: 5 (ref 5.0–8.0)

## 2023-02-13 LAB — BASIC METABOLIC PANEL
Anion gap: 10 (ref 5–15)
BUN: 30 mg/dL — ABNORMAL HIGH (ref 8–23)
CO2: 26 mmol/L (ref 22–32)
Calcium: 8.3 mg/dL — ABNORMAL LOW (ref 8.9–10.3)
Chloride: 100 mmol/L (ref 98–111)
Creatinine, Ser: 2.42 mg/dL — ABNORMAL HIGH (ref 0.44–1.00)
GFR, Estimated: 19 mL/min — ABNORMAL LOW (ref >60)
Glucose, Bld: 128 mg/dL — ABNORMAL HIGH (ref 70–99)
Potassium: 3.6 mmol/L (ref 3.5–5.1)
Sodium: 136 mmol/L (ref 135–145)

## 2023-02-13 MED ORDER — METOPROLOL SUCCINATE ER 50 MG PO TB24: 50.0000 mg | ORAL_TABLET | Freq: Every day | ORAL | Status: DC

## 2023-02-13 MED ORDER — METOPROLOL SUCCINATE ER 50 MG PO TB24
75.0000 mg | ORAL_TABLET | Freq: Every day | ORAL | Status: DC
  Administered 2023-02-13 – 2023-02-14 (×2): 75 mg via ORAL
  Filled 2023-02-13 (×2): qty 1

## 2023-02-13 NOTE — Telephone Encounter (Signed)
Left voicemail for patient to return call to office. 

## 2023-02-13 NOTE — Progress Notes (Signed)
Rounding Note    Patient Name: Kara Bush Date of Encounter: 02/13/2023  [facility] Cardiologist: Chrystie Nose, MD   Subjective   No issues overnight. Creatinine worse today- suspect combination pre-renal / ATN. Nephrology consult per hospitalist. Renal ultrasound ordered. Holding diuretics and entresto. Did not advise fluids since she became volume overloaded earlier this admission with fluids.  Inpatient Medications    Scheduled Meds:   stroke: early stages of recovery book   Does not apply Once   apixaban  2.5 mg Oral BID   atorvastatin  40 mg Oral Daily   citalopram  20 mg Oral Daily   feeding supplement  1 Container Oral BID BM   folic acid  1 mg Oral Daily   hydrALAZINE  25 mg Oral Q8H   isosorbide mononitrate  15 mg Oral Daily   metoprolol succinate  75 mg Oral Daily   ondansetron  4 mg Oral Q8H   pantoprazole  40 mg Oral BID   Continuous Infusions:  PRN Meds: acetaminophen, albuterol, HYDROcodone-acetaminophen, HYDROcodone-acetaminophen, metoprolol tartrate, prochlorperazine, traZODone   Vital Signs    Vitals:   02/12/23 2149 02/13/23 0500 02/13/23 0522 02/13/23 0933  BP: (!) 106/49  139/72 (!) 129/56  Pulse: 77  89 82  Resp: 17  18   Temp: 97.9 F (36.6 C)  98.1 F (36.7 C)   TempSrc: Oral  Oral   SpO2: 95%  98%   Weight:  87.4 kg    Height:        Intake/Output Summary (Last 24 hours) at 02/13/2023 1012 Last data filed at 02/13/2023 0940 Gross per 24 hour  Intake 480 ml  Output 1000 ml  Net -520 ml      02/13/2023    5:00 AM 02/12/2023    4:49 AM 02/10/2023    1:58 PM  Last 3 Weights  Weight (lbs) 192 lb 10.9 oz 184 lb 15.5 oz 181 lb  Weight (kg) 87.4 kg 83.9 kg 82.101 kg      Telemetry    Atrial fibrillation heart rate 80s- Personally Reviewed  ECG    No new tracings- Personally Reviewed  Physical Exam   GEN: No acute distress.   Neck: No JVD Cardiac: Irregularly irregular Respiratory: Clear to auscultation  bilaterally. GI: Soft, nontender, non-distended  MS: No edema; No deformity. Neuro:  Nonfocal  Psych: Normal affect   Labs    High Sensitivity Troponin:   Recent Labs  Lab 02/04/23 2327 02/05/23 0309 02/05/23 0537 02/05/23 1430 02/06/23 0711  TROPONINIHS 44* 43* 91* 633* 735*     Chemistry Recent Labs  Lab 02/08/23 0259 02/09/23 0337 02/10/23 0429 02/11/23 0444 02/12/23 0424 02/13/23 0349  NA 133* 135 136 135 135 136  K 3.7 3.2* 3.4* 3.1* 3.3* 3.6  CL 96* 97* 97* 97* 100 100  CO2 27 30 29 28 26 26   GLUCOSE 118* 115* 118* 112* 113* 128*  BUN 19 18 15 16  24* 30*  CREATININE 1.02* 1.10* 1.09* 1.43* 1.98* 2.42*  CALCIUM 8.7* 8.7* 8.8* 8.5* 8.3* 8.3*  MG 1.8 1.7 1.8  --   --   --   GFRNONAA 55* 50* 50* 36* 25* 19*  ANIONGAP 10 8 10 10 9 10     Lipids  No results for input(s): "CHOL", "TRIG", "HDL", "LABVLDL", "LDLCALC", "CHOLHDL" in the last 168 hours.   Hematology Recent Labs  Lab 02/10/23 1321 02/11/23 0444 02/12/23 0424  WBC 11.4* 9.7 9.4  RBC 4.48 4.79 4.41  HGB 9.6* 10.2* 9.6*  HCT 32.9* 35.3* 32.6*  MCV 73.4* 73.7* 73.9*  MCH 21.4* 21.3* 21.8*  MCHC 29.2* 28.9* 29.4*  RDW 18.7* 18.7* 19.0*  PLT 298 331 295   Thyroid  No results for input(s): "TSH", "FREET4" in the last 168 hours.   BNP Recent Labs  Lab 02/10/23 1321  BNP 739.7*    DDimer No results for input(s): "DDIMER" in the last 168 hours.   Radiology    No results found.  Cardiac Studies   Echocardiogram 02/05/2023  1. Left ventricular ejection fraction, by estimation, is 55 to 60%. The  left ventricle has normal function. The left ventricle has no regional  wall motion abnormalities. Left ventricular diastolic parameters are  indeterminate.   2. Right ventricular systolic function is normal. The right ventricular  size is normal. There is normal pulmonary artery systolic pressure.   3. Left atrial size was mildly dilated.   4. The mitral valve is degenerative. Mild mitral valve  regurgitation. No  evidence of mitral stenosis.   5. Partial fusion of right and left cusps. The aortic valve is tricuspid.  There is moderate calcification of the aortic valve. There is moderate  thickening of the aortic valve. Aortic valve regurgitation is mild.  Moderate aortic valve stenosis.   6. The inferior vena cava is normal in size with greater than 50%  respiratory variability, suggesting right atrial pressure of 3 mmHg.   Patient Profile     82 y.o. female hx of CAD s/p CABG x 3  (LIMA-LAD, SVG-Diag, and SVG-RCA) in 1998 s/p multiple percutaneous interventions, paroxysmal atrial fibrillation on Eliquis, bicuspid aortic valve with mild to moderate regurgitation, pulmonary embolism, thoracic aortic aneurysm, chronic diastolic heart failure, TIA, PAD s/p aortoiliac bypass, hypertension, hyperlipidemia, OSA, and recent admission for acute blood loss anemia.  Currently being evaluated for dyspnea.  Assessment & Plan    Dyspnea Chronic HFpEF Patient with minimal complaints of dyspnea, now resolved after 40 mg of IV Lasix yesterday.  Off supplemental o2. She has known chronic HFpEF and was likely volume up from correction of dehydration.  Echocardiogram during this admission shows normal LVEF, no significant changes.   Creatinine worse- has not reached peak. Avoid nephrotoxins and diuretics. Nephrology consult. Renal ultrasound- suspect ATN d/t hypotension and possibly pre-renal. Stop jardiance.  Continue Toprol XL 75mg .   Paroxysmal atrial fibrillation Patient has fluctuated in and out of atrial fibrillation and NSR throughout this admission and at last office visit.  Given recent GI bleed in April hospitalization, plan is to continue reduced dose of Eliquis 2.5 mg twice daily, then reevaluate at upcoming appointment in July.  Currently in A-fib right now with controlled rates around 80-90 Continue Eliquis 2.5 mg and Toprol-XL 75 mg   CAD status post CABG in 1998 was multiple  subsequent stenting Elevated troponins Patient was reviewed by Dr. Jens Som who felt to be demand ischemia.  I agree with this.  Continue outpatient workup with PET stress test. Continue Imdur 15 mg, BB, statin, Zetia   Hypertension Normotensive today   Hyperlipidemia Continue atorvastatin 40 mg and zetia.    Moderate aortic valve stenosis.  Continue to monitor.    Anemia Recently admitted due to acute blood loss anemia requiring multiple transfusions.  Looks to be stable currently. 9.6.   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  Nausea and vomiting.  Barium swallow test indicating dysmotility. GI following. Resolved.  CKD stage 3   For  questions or updates, please contact [facility] Please consult [email or web address] for contact info under   Chrystie Nose, MD, Milagros Loll    [facility]  Medical Director of the [facility] Lipid Disorders &  Cardiovascular Risk Reduction Clinic Diplomate of the American Board of Clinical Lipidology Attending Cardiologist  Direct Dial: [phone]  Fax: [phone]  Website:  [email or web address]  Chrystie Nose, MD  02/13/2023, 10:12 AM

## 2023-02-13 NOTE — Progress Notes (Signed)
PROGRESS NOTE    Kara Bush  WUJ:[id number] DOB: 1941-05-08 DOA: 02/04/2023 PCP: Jackelyn Poling, DO   Brief Narrative: 82 year old with past medical history significant for ulcerative colitis, CKD, ascending thoracic aneurysm who presented to the hospital with confusion, nausea, vomiting.  Recent admission in April for acute blood loss anemia requiring blood transfusion, endoscopy only showed hiatal hernia, colonoscopy showed stable UC.  Since discharge she has been having difficulty with poor oral intake, nausea, 10 pounds weight loss.  Intermittent diarrhea.  Patient has been confused for the last 2 months, worse over the last several days, with intermittent slurred speech and difficulty finding words.   Assessment & Plan:   Principal Problem:   Acute encephalopathy Active Problems:   Chest wall pain   PAD (peripheral artery disease), aortic-iliac bypass 1991, mild carotid bruits   OSA (obstructive sleep apnea)   Chronic fatigue syndrome with fibromyalgia   Accelerated hypertension   Ulcerative colitis (HCC)   Atrial fibrillation (HCC)   CKD (chronic kidney disease) stage 3, GFR 30-59 ml/min (HCC)   Elevated troponin   Hypokalemia   Hypomagnesemia   Intractable nausea and vomiting   Prolonged QT interval   Metabolic alkalosis   Chronic heart failure with preserved ejection fraction (HFpEF) (HCC)   Hypervolemia   AKI;  Cr peak to 1.9--- 2.4-- in setting Diuretics and hypotension.  Hold multiples diuretics.  She report she only has one working Kidney.  Check renal US.  UA with Pyuria. Denies Dysuria. Follow Urine culture/  Will get Nephrology consultation.    Intractable nausea and vomiting: Thought to be multifactorial, related to medication, polypharmacy.  On schedule Zofran.  Continue with  PPI BID.  She will need small, frequent meals.  Compazine PRN as recommended by Dr Elnoria Howard.  Nausea improved, tolerating diet .   Presbyesophagus:  Small frequent meals.   PPI BID.   Acute on Chronic Diastolic Heart failure exacerbation  Dyspnea; report episodes of SOB on and off. Having episode today, on my assessment.  EKG obtain, afib,  Cardiology has been consulted.  Chest x ray: Minimal Bibasilar subsegmental atelectasis with small pleural effusion.  Received IV lasix 7/09 Cr increase to 1.9-- entresto, lasix/ held.  Continue to hold diuretics.    Acute CVA:  intermittent slurred speech and confusion.  Patient with report of intermittent slurred speech and occasional confusion over the last several days.  MRI showed small acute CVA.  Neurology was consulted.  Plan is to continue with Eliquis for now. LDL 80, A1c 5.6, CT angio without LVO.  2D echo unremarkable Received IV thiamine.     Elevation of troponin: On admission EKG showed some ST depression in the inferolateral leads, cardiology was consulted as a code STEMI however this was canceled as he was not felt to be a STEMI.  Case was discussed with Dr. Jens Som this is likely demand ischemia in the setting of acute illness.  Echo with normal ejection fraction and no wall motion abnormality.  Hypokalemia, hypomagnesemia: Replete orally.   Chronic pain, fibromyalgia: Will continue with Hydrocodone every 4 Hours PRN.  At home she was taking half a tablet every 2 hours.  I will add half tablet hydrocodone for severe Pain TID PRN.  Vitamin D level. 53 not low.  She will need close follow up with pain clinic. Would avoid escalating opioids. Opioids can cause nausea.   PAD, history of aortoiliac bypass 1991  Ulcerative colitis: stable.  Insomnia; will try trazodone PRN . Ambien has  been discontinue  PAF: on eliquis and metoprolol.   Anemia. Hb was 14 on admission, suspect hemoconcentration.  Last several days ago 11---10/  Hb two months ago was at 8.   Hb stable at 10./   Hypokalemia; Replaced.      Nutrition Problem: Inadequate oral intake Etiology: acute illness, nausea,  vomiting, poor appetite    Signs/Symptoms: per patient/family report, energy intake < 75% for > or equal to 1 month    Interventions: Refer to RD note for recommendations  Estimated body mass index is 33.07 kg/m as calculated from the following:   Height as of this encounter: 5\' 4"  (1.626 m).   Weight as of this encounter: 87.4 kg.   DVT prophylaxis: eliquis Code Status: DNR Family Communication:Daughter over phone 7/11 Disposition Plan:  Status is: Inpatient Remains inpatient appropriate because: management of nausea, dyspnea. Plan to discharge tomorrow if stable.     Consultants:  Cardiology GI  Procedures:    Antimicrobials:    Subjective: She is tolerating diet. Feels well. Had BM today. Denies dysuria.   Objective: Vitals:   02/12/23 2149 02/13/23 0500 02/13/23 0522 02/13/23 0933  BP: (!) 106/49  139/72 (!) 129/56  Pulse: 77  89 82  Resp: 17  18   Temp: 97.9 F (36.6 C)  98.1 F (36.7 C)   TempSrc: Oral  Oral   SpO2: 95%  98%   Weight:  87.4 kg    Height:        Intake/Output Summary (Last 24 hours) at 02/13/2023 1033 Last data filed at 02/13/2023 0940 Gross per 24 hour  Intake 480 ml  Output 1000 ml  Net -520 ml   Filed Weights   02/10/23 1358 02/12/23 0449 02/13/23 0500  Weight: 82.1 kg 83.9 kg 87.4 kg    Examination:  General exam: NAD Respiratory system: CTA Cardiovascular system: S 1, S 2 RRR Gastrointestinal system: BS present, soft, nt Central nervous system: Alert, conversant Extremities: No edema    Data Reviewed: I have personally reviewed following labs and imaging studies  CBC: Recent Labs  Lab 02/07/23 0302 02/10/23 1321 02/11/23 0444 02/12/23 0424  WBC 12.1* 11.4* 9.7 9.4  HGB 10.3* 9.6* 10.2* 9.6*  HCT 35.4* 32.9* 35.3* 32.6*  MCV 74.5* 73.4* 73.7* 73.9*  PLT 214 298 331 295   Basic Metabolic Panel: Recent Labs  Lab 02/07/23 0302 02/08/23 0259 02/09/23 0337 02/10/23 0429 02/11/23 0444 02/12/23 0424  02/13/23 0349  NA 135 133* 135 136 135 135 136  K 3.5 3.7 3.2* 3.4* 3.1* 3.3* 3.6  CL 96* 96* 97* 97* 97* 100 100  CO2 30 27 30 29 28 26 26   GLUCOSE 115* 118* 115* 118* 112* 113* 128*  BUN 16 19 18 15 16  24* 30*  CREATININE 1.03* 1.02* 1.10* 1.09* 1.43* 1.98* 2.42*  CALCIUM 8.7* 8.7* 8.7* 8.8* 8.5* 8.3* 8.3*  MG 2.2 1.8 1.7 1.8  --   --   --    GFR: Estimated Creatinine Clearance: 18.9 mL/min (A) (by C-G formula based on SCr of 2.42 mg/dL (H)). Liver Function Tests: No results for input(s): "AST", "ALT", "ALKPHOS", "BILITOT", "PROT", "ALBUMIN" in the last 168 hours.  No results for input(s): "LIPASE", "AMYLASE" in the last 168 hours. No results for input(s): "AMMONIA" in the last 168 hours. Coagulation Profile: No results for input(s): "INR", "PROTIME" in the last 168 hours. Cardiac Enzymes: No results for input(s): "CKTOTAL", "CKMB", "CKMBINDEX", "TROPONINI" in the last 168 hours. BNP (last 3 results) No  results for input(s): "PROBNP" in the last 8760 hours. HbA1C: No results for input(s): "HGBA1C" in the last 72 hours. CBG: No results for input(s): "GLUCAP" in the last 168 hours. Lipid Profile: No results for input(s): "CHOL", "HDL", "LDLCALC", "TRIG", "CHOLHDL", "LDLDIRECT" in the last 72 hours. Thyroid Function Tests: No results for input(s): "TSH", "T4TOTAL", "FREET4", "T3FREE", "THYROIDAB" in the last 72 hours. Anemia Panel: No results for input(s): "VITAMINB12", "FOLATE", "FERRITIN", "TIBC", "IRON", "RETICCTPCT" in the last 72 hours. Sepsis Labs: No results for input(s): "PROCALCITON", "LATICACIDVEN" in the last 168 hours.  Recent Results (from the past 240 hour(s))  SARS Coronavirus 2 by RT PCR (hospital order, performed in [facility] hospital lab) *cepheid single result test* Anterior Nasal Swab     Status: None   Collection Time: 02/05/23  2:44 AM   Specimen: Anterior Nasal Swab  Result Value Ref Range Status   SARS Coronavirus 2 by RT PCR NEGATIVE NEGATIVE Final     Comment: (NOTE) SARS-CoV-2 target nucleic acids are NOT DETECTED.  The SARS-CoV-2 RNA is generally detectable in upper and lower respiratory specimens during the acute phase of infection. The lowest concentration of SARS-CoV-2 viral copies this assay can detect is 250 copies / mL. A negative result does not preclude SARS-CoV-2 infection and should not be used as the sole basis for treatment or other patient management decisions.  A negative result may occur with improper specimen collection / handling, submission of specimen other than nasopharyngeal swab, presence of viral mutation(s) within the areas targeted by this assay, and inadequate number of viral copies (<250 copies / mL). A negative result must be combined with clinical observations, patient history, and epidemiological information.  Fact Sheet for Patients:   RoadLapTop.co.za  Fact Sheet for Healthcare Providers: http://kim-miller.com/  This test is not yet approved or  cleared by the [place] FDA and has been authorized for detection and/or diagnosis of SARS-CoV-2 by FDA under an Emergency Use Authorization (EUA).  This EUA will remain in effect (meaning this test can be used) for the duration of the COVID-19 declaration under Section 564(b)(1) of the Act, 21 U.S.C. section 360bbb-3(b)(1), unless the authorization is terminated or revoked sooner.  Performed at [facility], [address], [place], [place] [address]          Radiology Studies: No results found.      Scheduled Meds:   stroke: early stages of recovery book   Does not apply Once   apixaban  2.5 mg Oral BID   atorvastatin  40 mg Oral Daily   citalopram  20 mg Oral Daily   feeding supplement  1 Container Oral BID BM   folic acid  1 mg Oral Daily   hydrALAZINE  25 mg Oral Q8H   isosorbide mononitrate  15 mg Oral Daily   metoprolol succinate  75 mg Oral Daily    ondansetron  4 mg Oral Q8H   pantoprazole  40 mg Oral BID   Continuous Infusions:     LOS: 8 days    Time spent: 35 minutes    Aida Lemaire A Katricia Prehn, MD [facility]   If 7PM-7AM, please contact night-coverage [email or web address]  02/13/2023, 10:33 AM

## 2023-02-13 NOTE — Plan of Care (Signed)
  Problem: Education: Goal: Knowledge of General Education information will improve Description: Including pain rating scale, medication(s)/side effects and non-pharmacologic comfort measures Outcome: Progressing   Problem: Activity: Goal: Risk for activity intolerance will decrease Outcome: Progressing   Problem: Nutrition: Goal: Adequate nutrition will be maintained Outcome: Progressing   Problem: Coping: Goal: Level of anxiety will decrease Outcome: Progressing   Problem: Elimination: Goal: Will not experience complications related to urinary retention Outcome: Progressing   Problem: Pain Managment: Goal: General experience of comfort will improve Outcome: Progressing   Problem: Safety: Goal: Ability to remain free from injury will improve Outcome: Progressing   Problem: Skin Integrity: Goal: Risk for impaired skin integrity will decrease Outcome: Progressing   Problem: Education: Goal: Knowledge of disease or condition will improve Outcome: Progressing   Problem: Ischemic Stroke/TIA Tissue Perfusion: Goal: Complications of ischemic stroke/TIA will be minimized Outcome: Progressing   Problem: Self-Care: Goal: Ability to participate in self-care as condition permits will improve Outcome: Progressing Goal: Ability to communicate needs accurately will improve Outcome: Progressing   Problem: Nutrition: Goal: Risk of aspiration will decrease Outcome: Progressing Goal: Dietary intake will improve Outcome: Progressing

## 2023-02-13 NOTE — Care Management Important Message (Signed)
Important Message  Patient Details IM Letter given Name: Kara Bush MRN: [id number] Date of Birth: [birth year]   Medicare Important Message Given:  Yes     Caren Macadam 02/13/2023, 10:21 AM

## 2023-02-13 NOTE — Progress Notes (Signed)
Physical Therapy Treatment Patient Details Name: Kara Bush MRN: [id number] DOB: 1941-01-05 Today's Date: 02/13/2023   History of Present Illness Patient is a 82 year old female who presented on 02/04/23 with poor oral intake, progressive nausea, increased confusion, and intermittent slurred speech. Patient found to have small L parietal acute CVA, also with Acute on Chronic Diastolic Heart failure exacerbation this admission. PMH: fibromyalgia, AAA, CAD s/p CABG, chronic fatigue/chronic pain, HLD, HTN.    PT Comments  Cardiologist in to see pt beginning of session and reports nephrology consult placed. Pt was hoping for d/c home today however to remain in hospital.  Pt able to use bathroom independently and then ambulated in hallway.  Mobility appears improved.  Will likely check back Monday (if pt remains) and consider signing off if pt continues to perform well.       Assistance Recommended at Discharge Intermittent Supervision/Assistance  If plan is discharge home, recommend the following:  Can travel by private vehicle    A little help with bathing/dressing/bathroom;Assistance with cooking/housework;Assist for transportation;Help with stairs or ramp for entrance      Equipment Recommendations  None recommended by PT    Recommendations for Other Services       Precautions / Restrictions Precautions Precautions: Fall     Mobility  Bed Mobility Overal bed mobility: Modified Independent                  Transfers Overall transfer level: Modified independent Equipment used: None                    Ambulation/Gait Ambulation/Gait assistance: Supervision Gait Distance (Feet): 200 Feet (total) Assistive device: None Gait Pattern/deviations: Step-through pattern, Decreased stride length Gait velocity: decreased     General Gait Details: slow but steady, keeps arms tucked around abdomen, no overt LOB observed, 100'x2, brief standing rest break; HR  87-104 bpm during ambulation, denies dizziness   Stairs             Wheelchair Mobility     Tilt Bed    Modified Rankin (Stroke Patients Only)       Balance                                            Cognition Arousal/Alertness: Awake/alert Behavior During Therapy: WFL for tasks assessed/performed Overall Cognitive Status: Within Functional Limits for tasks assessed                                          Exercises      General Comments        Pertinent Vitals/Pain Pain Assessment Pain Assessment: No/denies pain    Home Living                          Prior Function            PT Goals (current goals can now be found in the care plan section) Progress towards PT goals: Progressing toward goals    Frequency    Min 1X/week      PT Plan Current plan remains appropriate    Co-evaluation              AM-PAC PT "6 Clicks" Mobility  Outcome Measure  Help needed turning from your back to your side while in a flat bed without using bedrails?: None Help needed moving from lying on your back to sitting on the side of a flat bed without using bedrails?: None Help needed moving to and from a bed to a chair (including a wheelchair)?: None Help needed standing up from a chair using your arms (e.g., wheelchair or bedside chair)?: None Help needed to walk in hospital room?: None Help needed climbing 3-5 steps with a railing? : A Little 6 Click Score: 23    End of Session Equipment Utilized During Treatment: Gait belt Activity Tolerance: Patient tolerated treatment well Patient left: with call bell/phone within reach;in chair   PT Visit Diagnosis: Difficulty in walking, not elsewhere classified (R26.2)     Time: 4098-1191 PT Time Calculation (min) (ACUTE ONLY): 10 min  Charges:    $Gait Training: 8-22 mins PT General Charges $$ ACUTE PT VISIT: 1 Visit                    Paulino Door,  DPT Physical Therapist Acute Rehabilitation Services Office: [phone]    Janan Halter [place] 02/13/2023, 1:05 PM

## 2023-02-14 DIAGNOSIS — G9340 Encephalopathy, unspecified: Secondary | ICD-10-CM | POA: Diagnosis not present

## 2023-02-14 LAB — URINE CULTURE

## 2023-02-14 LAB — BASIC METABOLIC PANEL
Anion gap: 10 (ref 5–15)
BUN: 29 mg/dL — ABNORMAL HIGH (ref 8–23)
CO2: 27 mmol/L (ref 22–32)
Calcium: 8.8 mg/dL — ABNORMAL LOW (ref 8.9–10.3)
Chloride: 101 mmol/L (ref 98–111)
Creatinine, Ser: 1.72 mg/dL — ABNORMAL HIGH (ref 0.44–1.00)
GFR, Estimated: 29 mL/min — ABNORMAL LOW (ref >60)
Glucose, Bld: 164 mg/dL — ABNORMAL HIGH (ref 70–99)
Potassium: 3.8 mmol/L (ref 3.5–5.1)
Sodium: 138 mmol/L (ref 135–145)

## 2023-02-14 MED ORDER — CITALOPRAM HYDROBROMIDE 20 MG PO TABS: 20.0000 mg | ORAL_TABLET | Freq: Every day | ORAL | 0 refills | Status: AC

## 2023-02-14 MED ORDER — ONDANSETRON 4 MG PO TBDP: 4.0000 mg | ORAL_TABLET | Freq: Three times a day (TID) | ORAL | 0 refills | Status: DC

## 2023-02-14 MED ORDER — METOPROLOL SUCCINATE ER 25 MG PO TB24: 75.0000 mg | ORAL_TABLET | Freq: Every day | ORAL | 0 refills | Status: DC

## 2023-02-14 MED ORDER — HYDRALAZINE HCL 25 MG PO TABS: 25.0000 mg | ORAL_TABLET | Freq: Three times a day (TID) | ORAL | 0 refills | Status: DC

## 2023-02-14 MED ORDER — ATORVASTATIN CALCIUM 40 MG PO TABS: 40.0000 mg | ORAL_TABLET | Freq: Every day | ORAL | 0 refills | Status: DC

## 2023-02-14 MED ORDER — PANTOPRAZOLE SODIUM 40 MG PO TBEC: 40.0000 mg | DELAYED_RELEASE_TABLET | Freq: Two times a day (BID) | ORAL | 1 refills | Status: DC

## 2023-02-14 MED ORDER — TRAZODONE HCL 50 MG PO TABS: 50.0000 mg | ORAL_TABLET | Freq: Every evening | ORAL | 0 refills | Status: AC | PRN

## 2023-02-14 MED ORDER — TRAZODONE HCL 50 MG PO TABS: 25.0000 mg | ORAL_TABLET | Freq: Every evening | ORAL | 0 refills | Status: DC | PRN

## 2023-02-14 NOTE — Progress Notes (Signed)
   Renal function improved today- would keep off heart failure meds (jardiance, entresto, lasix) for now. BP normal. Will revisit starting medications at follow-up. This is scheduled on 7/23 with Carlos Levering, NP.  Ok to d/c home from my standpoint.  [facility] will sign off.   Medication Recommendations:  remain off jardiance, entresto and lasix Other recommendations (labs, testing, etc):  outpatient BMET prior to follow-up Follow up as an outpatient:  Carlos Levering, NP on 02/24/23  Chrystie Nose, MD, [facility], FACP  [place]  [facility]  Medical Director of the [facility]  [facility] Diplomate of the American Board of Clinical Lipidology Attending Cardiologist  Direct Dial: [phone]  Fax: [phone]  Website:  [email or web address]

## 2023-02-14 NOTE — Discharge Instructions (Addendum)
If your weight increases more than 2-3 pound in 24 hours, please contact your cardiology. You might be advised to take a dose of your lasix.   You will need lab work next week to follow on your Kidney function.   You have new medications for Blood pressure.  Do not take for now: entresto, Lasix. Follow up with cardiology to discuss when to resume this medications.  Keep your self Hydrated.  Follow up with Dr Caryl Ada for further care of chronic Nausea.   Results of Urine culture is Pending at this time, please follow results with your PCP

## 2023-02-14 NOTE — Discharge Summary (Addendum)
Physician Discharge Summary   Patient: Kara Bush MRN: [id number] DOB: 1941-01-14  Admit date:     02/04/2023  Discharge date: 02/14/23  Discharge Physician: Alba Cory   PCP: Jackelyn Poling, DO   Recommendations at discharge:   Needs Bmet to follow renal function.  Monitor volume status.  Follow up with Dr Elnoria Howard for chronic nausea.  Follow urine culture.   Discharge Diagnoses: Principal Problem:   Acute encephalopathy Active Problems:   Chest wall pain   PAD (peripheral artery disease), aortic-iliac bypass 1991, mild carotid bruits   OSA (obstructive sleep apnea)   Chronic fatigue syndrome with fibromyalgia   Accelerated hypertension   Ulcerative colitis (HCC)   Atrial fibrillation (HCC)   CKD (chronic kidney disease) stage 3, GFR 30-59 ml/min (HCC)   Elevated troponin   Hypokalemia   Hypomagnesemia   Intractable nausea and vomiting   Prolonged QT interval   Metabolic alkalosis   Chronic heart failure with preserved ejection fraction (HFpEF) (HCC)   Hypervolemia  Resolved Problems:   * No resolved hospital problems. *  Hospital Course: 82 year old with past medical history significant for ulcerative colitis, CKD, ascending thoracic aneurysm who presented to the hospital with confusion, nausea, vomiting.  Recent admission in April for acute blood loss anemia requiring blood transfusion, endoscopy only showed hiatal hernia, colonoscopy showed stable UC.  Since discharge she has been having difficulty with poor oral intake, nausea, 10 pounds weight loss.  Intermittent diarrhea.  Patient has been confused for the last 2 months, worse over the last several days, with intermittent slurred speech and difficulty finding words.     Assessment and Plan: AKI;  Cr peak to 1.9--- 2.4-- in setting Diuretics and hypotension.  Hold multiples diuretics.  She report she only has one working Kidney.  renal US. Negative for hydronephrosis.  UA with Pyuria. Denies Dysuria.  Follow Urine culture/  Renal function improved, cr down to 1.7 . Plan to continue to hold HD medications and diuretics at discharge.      Intractable nausea and vomiting: Thought to be multifactorial, related to medication, polypharmacy.  On schedule Zofran.  Continue with  PPI BID.  She will need small, frequent meals.  Compazine PRN as recommended by Dr Elnoria Howard.  Nausea improved, tolerating diet .    Presbyesophagus:  Small frequent meals.  PPI BID.    Acute on Chronic Diastolic Heart failure exacerbation  Dyspnea; report episodes of SOB on and off. Having episode on my assessment on 7/09.  EKG obtain, afib,  Cardiology was consulted.  Chest x ray: Minimal Bibasilar subsegmental atelectasis with small pleural effusion.  Received IV lasix 7/09 Cr increase to 1.9-- entresto, lasix/ held.  Continue to hold diuretics. Cr improving down to 1.7 Continue with Imdur and hydralazine.    Acute CVA:  intermittent slurred speech and confusion.  Acute Metabolic Encephalopathy, related to delirium, acute illness.  Patient with report of intermittent slurred speech and occasional confusion over the last several days.  MRI showed small acute CVA.  Neurology was consulted.  Plan is to Continue with Eliquis .  LDL 80, A1c 5.6, CT angio without LVO.  2D echo unremarkable Received IV thiamine.  Alert and conversant.      Elevation of troponin: On admission EKG showed some ST depression in the inferolateral leads, cardiology was consulted as a code STEMI however this was canceled as he was not felt to be a STEMI.  Case was discussed with Dr. Jens Som this is likely demand  ischemia in the setting of acute illness.  Echo with normal ejection fraction and no wall motion abnormality.   Hypokalemia, hypomagnesemia: Replaced   Chronic pain, fibromyalgia: Continue with Hydrocodone every 4 Hours PRN.  Vitamin D level. 53 not low.  She will need close follow up with pain clinic. Would avoid escalating  opioids. Opioids can cause nausea.    PAD, history of aortoiliac bypass 1991   Ulcerative colitis: stable.  Insomnia; started  trazodone PRN . Ambien has been discontinue   PAF: on eliquis and metoprolol.    Anemia. Hb was 14 on admission, suspect hemoconcentration.  Last several days ago 11---10/  Hb two months ago was at 8.   Hb stable at 10./    Hypokalemia; Replaced.          Nutrition Problem: Inadequate oral intake Etiology: acute illness, nausea, vomiting, poor appetite       Signs/Symptoms: per patient/family report, energy intake < 75% for > or equal to 1 month         Nutrition Problem: Inadequate oral intake Etiology: acute illness, nausea, vomiting, poor appetite            Consultants: Cardiology, neurology  Procedures performed: none Disposition: Home Diet recommendation:  Discharge Diet Orders (From admission, onward)     Start     Ordered   02/14/23 0000  Diet - low sodium heart healthy        02/14/23 1018           Cardiac diet DISCHARGE MEDICATION: Allergies as of 02/14/2023       Reactions   Biotin Hives   Lisinopril Cough   Atenolol Other (See Comments)   colitis Other reaction(s): Bowel issues   Duloxetine Hcl Other (See Comments)   Mad pt feel spacey Other reaction(s): shaky; tired in the PMs   Lyrica [pregabalin] Other (See Comments)   Made pt feel spacey   Penicillin G Rash   Other reaction(s): rash   Penicillins Rash   Sulfamethoxazole-trimethoprim Nausea And Vomiting   Other reaction(s): elevated liver functions        Medication List     STOP taking these medications    amLODipine 5 MG tablet Commonly known as: NORVASC   betamethasone dipropionate 0.05 % cream   clobetasol ointment 0.05 % Commonly known as: TEMOVATE   ezetimibe 10 MG tablet Commonly known as: ZETIA   ferrous sulfate 325 (65 FE) MG EC tablet   furosemide 40 MG tablet Commonly known as: LASIX   methocarbamol 500 MG  tablet Commonly known as: ROBAXIN   ondansetron 4 MG tablet Commonly known as: ZOFRAN   potassium chloride 10 MEQ tablet Commonly known as: KLOR-CON M   sacubitril-valsartan 97-103 MG Commonly known as: ENTRESTO   zolpidem 12.5 MG CR tablet Commonly known as: AMBIEN CR       TAKE these medications    apixaban 2.5 MG Tabs tablet Commonly known as: ELIQUIS Take 1 tablet (2.5 mg total) by mouth 2 (two) times daily.   atorvastatin 40 MG tablet Commonly known as: LIPITOR Take 1 tablet (40 mg total) by mouth daily. Start taking on: February 15, 2023 What changed:  medication strength how much to take   b complex vitamins capsule Take 1 capsule by mouth daily.   citalopram 20 MG tablet Commonly known as: CELEXA Take 1 tablet (20 mg total) by mouth daily.   hydrALAZINE 25 MG tablet Commonly known as: APRESOLINE Take 1 tablet (25 mg total) by  mouth every 8 (eight) hours.   HYDROcodone-acetaminophen 5-325 MG tablet Commonly known as: NORCO/VICODIN Take 1 tablet by mouth 4 (four) times daily as needed for moderate pain. May Take an extra 0.5 to one tablet when pain is severe. What changed:  how much to take when to take this additional instructions   isosorbide mononitrate 30 MG 24 hr tablet Commonly known as: IMDUR Take 0.5 tablets (15 mg total) by mouth daily.   metoprolol succinate 25 MG 24 hr tablet Commonly known as: TOPROL-XL Take 3 tablets (75 mg total) by mouth daily. Take with or immediately following a meal. Start taking on: February 15, 2023 What changed:  medication strength how much to take additional instructions   multivitamin with minerals Tabs tablet Take 1 tablet by mouth daily.   ondansetron 4 MG disintegrating tablet Commonly known as: ZOFRAN-ODT Take 1 tablet (4 mg total) by mouth every 8 (eight) hours.   pantoprazole 40 MG tablet Commonly known as: PROTONIX Take 1 tablet (40 mg total) by mouth 2 (two) times daily.   traZODone 50 MG  tablet Commonly known as: DESYREL Take 1 tablet (50 mg total) by mouth at bedtime as needed for sleep.        Follow-up Information     [facility] Follow up.   Specialty: Home Health Services Why: Will call you to schedule. Contact information: [address] [place] [address] [phone]         Carlos Levering, NP Follow up.   Specialty: Cardiology Why: Tuesday Feb 24, 2023 Arrive by 8:10 AM Appt at 8:25 AM (25 min) Contact information: [address] [place] [address] [phone]                Discharge Exam: Ceasar Mons Weights   02/12/23 0449 02/13/23 0500 02/14/23 0457  Weight: 83.9 kg 87.4 kg 87.6 kg   General; NAD  Condition at discharge: stable  The results of significant diagnostics from this hospitalization (including imaging, microbiology, ancillary and laboratory) are listed below for reference.   Imaging Studies: US RENAL  Result Date: 02/13/2023 CLINICAL DATA:  AKI EXAM: RENAL / URINARY TRACT ULTRASOUND COMPLETE COMPARISON:  CT chest abdomen pelvis 02/04/2023 FINDINGS: Right Kidney: Renal measurements: 9.5 x 5.1 x 5.9 cm = volume: 151 mL. Echogenicity within normal limits. No mass or hydronephrosis visualized. Left Kidney: Renal measurements: 8.4 x 3.7 x 4.0 cm = volume: 64 mL. Echogenicity within normal limits. No mass or hydronephrosis visualized. Bladder: Appears normal for degree of bladder distention. Other: Limited exam due to body habitus and labored breathing. IMPRESSION: Unremarkable sonographic appearance of the kidneys. Electronically Signed   By: Minerva Fester M.D.   On: 02/13/2023 20:04   DG CHEST PORT 1 VIEW  Result Date: 02/10/2023 CLINICAL DATA:  Dyspnea. EXAM: PORTABLE CHEST 1 VIEW COMPARISON:  February 06, 2023. FINDINGS: Stable cardiomegaly. Status post coronary artery bypass graft. Minimal bibasilar subsegmental atelectasis is noted with possible small pleural effusions. Bony thorax  unremarkable. IMPRESSION: Minimal bibasilar subsegmental atelectasis is noted with possible small pleural effusions. Electronically Signed   By: Lupita Raider M.D.   On: 02/10/2023 12:40   DG ESOPHAGUS W SINGLE CM (SOL OR THIN BA)  Result Date: 02/09/2023 CLINICAL DATA:  Patient with difficulty swallowing.  Dysphagia. EXAM: ESOPHOGRAM/BARIUM SWALLOW TECHNIQUE: Single contrast examination was performed using thin barium. 13 mm barium tablet was also utilized. FLUOROSCOPY: Radiation Exposure Index (as provided by the fluoroscopic device): 1 minute 30  seconds COMPARISON:  None Available. FINDINGS: Oral and pharyngeal phases are negative. No aspiration. No evidence of cervical stricture. No fixed lesion of the thoracic esophagus. No hiatal hernia. Advanced soft a geode dysmotility. Poor primary stripping wave. Abnormal tertiary contractions. Stasis within the esophagus. Abnormal to and fro flow. No evidence of esophagitis or ulceration. 13.5 mm barium tablet passes after a few additional swallows, ruling out fixed stricture. IMPRESSION: 1. Advanced presbyesophagus/esophageal dysmotility. Poor primary stripping wave. Abnormal tertiary contractions. Stasis within the esophagus. Abnormal to and fro flow. 2. No evidence of esophagitis or ulceration. No hernia or fixed stricture. 3. 13.5 mm barium tablet passes after a few additional swallows, ruling out fixed stricture. Electronically Signed   By: Paulina Fusi M.D.   On: 02/09/2023 13:33   CT HEAD WO CONTRAST ( )  Result Date: 02/08/2023 CLINICAL DATA:  Altered mental status EXAM: CT HEAD WITHOUT CONTRAST TECHNIQUE: Contiguous axial images were obtained from the base of the skull through the vertex without intravenous contrast. RADIATION DOSE REDUCTION: This exam was performed according to the departmental dose-optimization program which includes automated exposure control, adjustment of the mA and/or kV according to patient size and/or use of iterative  reconstruction technique. COMPARISON:  None Available. FINDINGS: Brain: There is no mass, hemorrhage or extra-axial collection. There is generalized atrophy without lobar predilection. Hypodensity of the white matter is most commonly associated with chronic microvascular disease. Vascular: Atherosclerotic calcification of the internal carotid arteries at the skull base. No abnormal hyperdensity of the major intracranial arteries or dural venous sinuses. Skull: The visualized skull base, calvarium and extracranial soft tissues are normal. Sinuses/Orbits: No fluid levels or advanced mucosal thickening of the visualized paranasal sinuses. No mastoid or middle ear effusion. The orbits are normal. IMPRESSION: 1. No acute intracranial abnormality. 2. Generalized atrophy and findings of chronic microvascular disease. Electronically Signed   By: Deatra Robinson M.D.   On: 02/08/2023 19:34   EEG adult  Result Date: 02/06/2023 Kara Quest, MD     02/06/2023  5:09 PM Patient Name: Kara Bush MRN: [id number] Epilepsy Attending: Charlsie Bush Referring Physician/Provider: Therisa Doyne, MD Date: 02/06/2023 Duration: 30.57 mins Patient history: 82yo F with ams getting eeg to evaluate for seizure. Level of alertness: Awake, asleep AEDs during EEG study: Ativan Technical aspects: This EEG study was done with scalp electrodes positioned according to the 10-20 International system of electrode placement. Electrical activity was reviewed with band pass filter of 1-70Hz , sensitivity of 7 uV/mm, display speed of 56mm/sec with a 60Hz  notched filter applied as appropriate. EEG data were recorded continuously and digitally stored.  Video monitoring was available and reviewed as appropriate. Description: The posterior dominant rhythm consists of 8-9 Hz activity of moderate voltage (25-35 uV) seen predominantly in posterior head regions, symmetric and reactive to eye opening and eye closing. Sleep was characterized by  vertex waves, sleep spindles (12 to 14 Hz), maximal frontocentral region. EEG showed intermittent generalized polymorphic 3 to 6 Hz theta-delta slowing. Hyperventilation and photic stimulation were not performed.   ABNORMALITY - Intermittent slow, generalized IMPRESSION: This study is suggestive of mild diffuse encephalopathy, nonspecific etiology. No seizures or epileptiform discharges were seen throughout the recording. Kara Bush   DG CHEST PORT 1 VIEW  Result Date: 02/06/2023 CLINICAL DATA:  Dyspnea. EXAM: PORTABLE CHEST 1 VIEW COMPARISON:  02/04/2023. FINDINGS: Previous CABG surgery. Cardiac silhouette is normal in size. No mediastinal or hilar masses. Lungs are hyperexpanded. Mild linear opacities noted at the left lung  base consistent with scarring or atelectasis. Lungs otherwise clear. No convincing pleural effusion and no pneumothorax. Skeletal structures are grossly intact. IMPRESSION: No acute cardiopulmonary disease. No significant change from the recent prior study. Electronically Signed   By: Amie Portland M.D.   On: 02/06/2023 12:14   ECHOCARDIOGRAM COMPLETE  Result Date: 02/05/2023    ECHOCARDIOGRAM REPORT   Patient Name:   Kara Bush Date of Exam: 02/05/2023 Medical Rec #:  [id number]          Height:       64.0 in Accession #:    [id number]         Weight:       176.8 lb Date of Birth:  [birth year]         BSA:          1.857 m Patient Age:    81 years           BP:           161/67 mmHg Patient Gender: F                  HR:           86 bpm. Exam Location:  Inpatient Procedure: 2D Echo, Cardiac Doppler and Color Doppler Indications:    elevated troponin  History:        Patient has prior history of Echocardiogram examinations, most                 recent 11/23/2022. Prior CABG, PAD and chronic kidney disease,                 Arrythmias:Atrial Fibrillation, Signs/Symptoms:Chest Pain; Risk                 Factors:Sleep Apnea and Dyslipidemia.  Sonographer:    Delcie Roch RDCS  Referring Phys: [id number] ANASTASSIA DOUTOVA IMPRESSIONS  1. Left ventricular ejection fraction, by estimation, is 55 to 60%. The left ventricle has normal function. The left ventricle has no regional wall motion abnormalities. Left ventricular diastolic parameters are indeterminate.  2. Right ventricular systolic function is normal. The right ventricular size is normal. There is normal pulmonary artery systolic pressure.  3. Left atrial size was mildly dilated.  4. The mitral valve is degenerative. Mild mitral valve regurgitation. No evidence of mitral stenosis.  5. Partial fusion of right and left cusps. The aortic valve is tricuspid. There is moderate calcification of the aortic valve. There is moderate thickening of the aortic valve. Aortic valve regurgitation is mild. Moderate aortic valve stenosis.  6. The inferior vena cava is normal in size with greater than 50% respiratory variability, suggesting right atrial pressure of 3 mmHg. FINDINGS  Left Ventricle: Left ventricular ejection fraction, by estimation, is 55 to 60%. The left ventricle has normal function. The left ventricle has no regional wall motion abnormalities. The left ventricular internal cavity size was normal in size. There is  no left ventricular hypertrophy. Left ventricular diastolic parameters are indeterminate. Right Ventricle: The right ventricular size is normal. No increase in right ventricular wall thickness. Right ventricular systolic function is normal. There is normal pulmonary artery systolic pressure. The tricuspid regurgitant velocity is 2.69 m/s, and  with an assumed right atrial pressure of 3 mmHg, the estimated right ventricular systolic pressure is 31.9 mmHg. Left Atrium: Left atrial size was mildly dilated. Right Atrium: Right atrial size was normal in size. Pericardium: There is no evidence of pericardial effusion. Mitral Valve: The mitral  valve is degenerative in appearance. There is mild thickening of the mitral valve  leaflet(s). There is mild calcification of the mitral valve leaflet(s). Mild mitral valve regurgitation. No evidence of mitral valve stenosis. Tricuspid Valve: The tricuspid valve is normal in structure. Tricuspid valve regurgitation is not demonstrated. No evidence of tricuspid stenosis. Aortic Valve: Partial fusion of right and left cusps. The aortic valve is tricuspid. There is moderate calcification of the aortic valve. There is moderate thickening of the aortic valve. Aortic valve regurgitation is mild. Moderate aortic stenosis is present. Aortic valve mean gradient measures 16.5 mmHg. Aortic valve peak gradient measures 27.8 mmHg. Aortic valve area, by VTI measures 1.19 cm. Pulmonic Valve: The pulmonic valve was normal in structure. Pulmonic valve regurgitation is mild. No evidence of pulmonic stenosis. Aorta: The aortic root is normal in size and structure. Venous: The inferior vena cava is normal in size with greater than 50% respiratory variability, suggesting right atrial pressure of 3 mmHg. IAS/Shunts: No atrial level shunt detected by color flow Doppler.  LEFT VENTRICLE PLAX 2D LVIDd:         4.40 cm LVIDs:         3.50 cm LV PW:         1.30 cm LV IVS:        1.10 cm LVOT diam:     2.20 cm LV SV:         60 LV SV Index:   32 LVOT Area:     3.80 cm  RIGHT VENTRICLE          IVC RV Basal diam:  2.90 cm  IVC diam: 2.00 cm LEFT ATRIUM             Index        RIGHT ATRIUM           Index LA diam:        4.10 cm 2.21 cm/m   RA Area:     16.10 cm LA Vol (A2C):   81.5 ml 43.90 ml/m  RA Volume:   39.40 ml  21.22 ml/m LA Vol (A4C):   66.2 ml 35.66 ml/m LA Biplane Vol: 73.3 ml 39.48 ml/m  AORTIC VALVE AV Area (Vmax):    1.16 cm AV Area (Vmean):   1.11 cm AV Area (VTI):     1.19 cm AV Vmax:           263.75 cm/s AV Vmean:          187.500 cm/s AV VTI:            0.503 m AV Peak Grad:      27.8 mmHg AV Mean Grad:      16.5 mmHg LVOT Vmax:         80.57 cm/s LVOT Vmean:        54.833 cm/s LVOT VTI:           0.158 m LVOT/AV VTI ratio: 0.31  AORTA Ao Root diam: 3.20 cm Ao Asc diam:  3.70 cm TRICUSPID VALVE TR Peak grad:   28.9 mmHg TR Vmax:        269.00 cm/s  SHUNTS Systemic VTI:  0.16 m Systemic Diam: 2.20 cm Charlton Haws MD Electronically signed by Charlton Haws MD Signature Date/Time: 02/05/2023/2:33:00 PM    Final    MR BRAIN WO CONTRAST  Result Date: 02/05/2023 CLINICAL DATA:  Altered mental status, confusion for a few days. EXAM: MRI HEAD WITHOUT CONTRAST TECHNIQUE: Multiplanar, multiecho pulse sequences  of the brain and surrounding structures were obtained without intravenous contrast. COMPARISON:  CT/CTA head and neck 1 day prior FINDINGS: Brain: There is a punctate focus of elevated DWI signal with corresponding low ADC signal in the left parietal subcortical white matter consistent with a small acute infarct. There is no other acute infarct. There is no acute intracranial hemorrhage or extra-axial fluid collection. Background parenchymal volume is within expected limits for age. The ventricles are normal in size. Patchy and confluent FLAIR signal abnormality throughout the supratentorial white matter and pons likely reflects sequela of moderate two advanced chronic small-vessel ischemic change. There are small remote infarcts in the left cerebellar hemisphere and right caudate. The pituitary and suprasellar region are normal. There is no mass lesion. There is no mass effect or midline shift. Vascular: Normal flow voids. Skull and upper cervical spine: Normal marrow signal. Sinuses/Orbits: The paranasal sinuses are clear. Bilateral lens implants are in place. The globes and orbits are otherwise unremarkable. Other: The mastoid air cells and middle ear cavities are clear. IMPRESSION: Small acute infarct in the left parietal subcortical white matter. No other acute intracranial pathology. Electronically Signed   By: Lesia Hausen M.D.   On: 02/05/2023 12:44   CT Angio Chest/Abd/Pel for Dissection W and/or  W/WO  Result Date: 02/04/2023 CLINICAL DATA:  Altered mental status. EXAM: CT ANGIOGRAPHY CHEST, ABDOMEN AND PELVIS TECHNIQUE: Non-contrast CT of the chest was initially obtained. Multidetector CT imaging through the chest, abdomen and pelvis was performed using the standard protocol during bolus administration of intravenous contrast. Multiplanar reconstructed images and MIPs were obtained and reviewed to evaluate the vascular anatomy. RADIATION DOSE REDUCTION: This exam was performed according to the departmental dose-optimization program which includes automated exposure control, adjustment of the mA and/or kV according to patient size and/or use of iterative reconstruction technique. CONTRAST:  80mL OMNIPAQUE IOHEXOL 350 MG/ML SOLN COMPARISON:  Chest CT dated 09/10/2020 and CT of the abdomen pelvis dated 05/19/2022. FINDINGS: CTA CHEST FINDINGS Cardiovascular: Borderline cardiomegaly. No pericardial effusion. Three-vessel coronary vascular calcification and postsurgical changes of CABG. Moderate atherosclerotic calcification of the thoracic aorta. Fusiform aneurysm of the ascending aorta measuring up to 4.2 cm in diameter. This is relatively similar to prior CT. No aortic dissection. The origins of the great vessels of the aortic arch and the central pulmonary arteries appear patent for the degree of opacification. Mediastinum/Nodes: No hilar or mediastinal adenopathy. Small hiatal hernia. The esophagus is grossly unremarkable. No mediastinal fluid collection. Lungs/Pleura: Faint diffuse hazy densities throughout the lungs may represent atelectasis or areas of air trapping related to underlying small airways versus small vessel disease. No focal consolidation, pleural effusion, or pneumothorax. The central airways are patent. Musculoskeletal: Median sternotomy wires. No acute osseous pathology. Review of the MIP images confirms the above findings. CTA ABDOMEN AND PELVIS FINDINGS VASCULAR Aorta: Advanced  atherosclerotic calcification of the abdominal aorta. Postsurgical changes of aorto bi iliac bypass. There is advanced atherosclerotic calcification with luminal narrowing of the abdominal aorta at the level of the renal arteries as well as just above the bypass graft. No aortic dissection. No periaortic fluid collection. Celiac: Atherosclerotic calcification of the origin of the celiac axis. The celiac trunk and its major branches remain patent. SMA: Atherosclerotic calcification of the origin of the SMA. The SMA remains patent. Renals: Advanced atherosclerotic calcification of the origin of the left renal artery. There is significantly diminished flow in the left renal artery. The right renal artery is patent. IMA: The IMA has  been previously ligated and not visualized. Inflow: The bypass graft and the external iliac arteries are patent. There is moderate atherosclerotic calcification of the internal iliac arteries. The internal iliac arteries also remain patent. Veins: The IVC is grossly unremarkable.  No portal venous gas. Review of the MIP images confirms the above findings. NON-VASCULAR No intra-abdominal free air or free fluid. Hepatobiliary: The liver is unremarkable. No biliary dilatation. Cholecystectomy. Dilatation of the common bile duct, post cholecystectomy. No retained calcified stone noted in the central CBD. Pancreas: Unremarkable. No pancreatic ductal dilatation or surrounding inflammatory changes. Spleen: Several small splenic cysts. Indeterminate 2 cm hypoenhancing lesion in the medial spleen which appears larger compared to prior CT. This is not evaluated on this exam. Further evaluation with ultrasound on a nonemergent basis recommended. Adrenals/Urinary Tract: The adrenal glands are unremarkable. Atrophic left kidney. Punctate nonobstructing left renal interpolar calculus. There is significant decreased perfusion to the left kidney. There is no hydronephrosis on the right. Several small right  renal cyst. The visualized ureters and urinary bladder appear unremarkable. Stomach/Bowel: Small hiatal hernia. Sigmoid diverticulosis and scattered colonic diverticula without active inflammatory changes. There is no bowel obstruction or active inflammation. The appendix is normal. Lymphatic: No adenopathy. Reproductive: The uterus is grossly unremarkable. No adnexal masses. Other: Small fat containing left inguinal hernia. Status post prior ventral hernia repair. Musculoskeletal: Osteopenia with degenerative changes of the spine. No acute osseous pathology. Review of the MIP images confirms the above findings. IMPRESSION: 1. No acute intrathoracic, abdominal, or pelvic pathology. No aortic dissection. 2. 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 Recommend annual imaging followup by CTA or MRA. This recommendation follows 2010 ACCF/AHA/AATS/ACR/ASA/SCA/SCAI/SIR/STS/SVM Guidelines for the Diagnosis and Management of Patients with Thoracic Aortic Disease. Circulation. 2010; 121: B147-W295. Aortic aneurysm NOS (ICD10-I71.9) 3. 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 4. Atrophic left kidney with significantly diminished perfusion. 5. Colonic diverticulosis. No bowel obstruction. Normal appendix. 6. Indeterminate 2 cm hypoenhancing lesion in the medial spleen which appears larger compared to prior CT. 7.  Aortic Atherosclerosis (ICD10-I70.0). Electronically Signed   By: Elgie Collard M.D.   On: 02/04/2023 23:04   CT ANGIO HEAD NECK W WO CM  Result Date: 02/04/2023 CLINICAL DATA:  Slurred speech EXAM: CT ANGIOGRAPHY HEAD AND NECK WITH AND WITHOUT CONTRAST TECHNIQUE: Multidetector CT imaging of the head and neck was performed using the standard protocol during bolus administration of intravenous contrast. Multiplanar CT image reconstructions and MIPs were obtained to evaluate the vascular anatomy. Carotid stenosis measurements (when applicable) are obtained utilizing NASCET criteria, using the  distal internal carotid diameter as the denominator. RADIATION DOSE REDUCTION: This exam was performed according to the departmental dose-optimization program which includes automated exposure control, adjustment of the mA and/or kV according to patient size and/or use of iterative reconstruction technique. CONTRAST:  80mL OMNIPAQUE IOHEXOL 350 MG/ML SOLN COMPARISON:  None Available. FINDINGS: CT HEAD FINDINGS Brain: There is no mass, hemorrhage or extra-axial collection. The size and configuration of the ventricles and extra-axial CSF spaces are normal. There is no acute or chronic infarction. There is hypoattenuation of the periventricular white matter, most commonly indicating chronic ischemic microangiopathy. Skull: The visualized skull base, calvarium and extracranial soft tissues are normal. Sinuses/Orbits: No fluid levels or advanced mucosal thickening of the visualized paranasal sinuses. No mastoid or middle ear effusion. The orbits are normal. CTA NECK FINDINGS SKELETON: There is no bony spinal canal stenosis. No lytic or blastic lesion. OTHER NECK: Normal pharynx, larynx and  major salivary glands. No cervical lymphadenopathy. Unremarkable thyroid gland. UPPER CHEST: No pneumothorax or pleural effusion. No nodules or masses. AORTIC ARCH: There is calcific atherosclerosis of the aortic arch. Conventional 3 vessel aortic branching pattern. RIGHT CAROTID SYSTEM: No dissection, occlusion or aneurysm. There is calcified atherosclerosis extending into the proximal ICA, resulting in 50% stenosis. LEFT CAROTID SYSTEM: No dissection, occlusion or aneurysm. There is calcified atherosclerosis extending into the proximal ICA, resulting in less than 50% stenosis. VERTEBRAL ARTERIES: Left dominant configuration.There is no dissection, occlusion or flow-limiting stenosis to the skull base (V1-V3 segments). CTA HEAD FINDINGS POSTERIOR CIRCULATION: --Vertebral arteries: Normal V4 segments. --Inferior cerebellar arteries:  Normal. --Basilar artery: Normal. --Superior cerebellar arteries: Normal. --Posterior cerebral arteries (PCA): Normal. ANTERIOR CIRCULATION: --Intracranial internal carotid arteries: Normal. --Anterior cerebral arteries (ACA): Normal. Both A1 segments are present. Patent anterior communicating artery (a-comm). --Middle cerebral arteries (MCA): Normal. VENOUS SINUSES: As permitted by contrast timing, patent. ANATOMIC VARIANTS: Fetal origin of the right posterior cerebral artery. Review of the MIP images confirms the above findings. IMPRESSION: 1. No emergent large vessel occlusion or high-grade stenosis of the intracranial arteries. 2. Bilateral carotid bifurcation atherosclerosis resulting in 50% stenosis of the proximal right internal carotid artery and slightly less than 50% stenosis of the proximal left internal carotid artery. 3. Chronic ischemic microangiopathy. Aortic Atherosclerosis (ICD10-I70.0). Electronically Signed   By: Deatra Robinson M.D.   On: 02/04/2023 22:56   DG Chest Portable 1 View  Result Date: 02/04/2023 CLINICAL DATA:  Code STEMI, chest pain EXAM: PORTABLE CHEST 1 VIEW COMPARISON:  Radiographs 11/22/2022 FINDINGS: Sternotomy and CABG. Defibrillator pads overlie the left chest. Stable cardiomediastinal silhouette. No focal consolidation, pleural effusion, or pneumothorax. No displaced rib fractures. IMPRESSION: No active disease. Electronically Signed   By: Minerva Fester M.D.   On: 02/04/2023 22:05    Microbiology: Results for orders placed or performed during the hospital encounter of 02/04/23  SARS Coronavirus 2 by RT PCR (hospital order, performed in [facility] hospital lab) *cepheid single result test* Anterior Nasal Swab     Status: None   Collection Time: 02/05/23  2:44 AM   Specimen: Anterior Nasal Swab  Result Value Ref Range Status   SARS Coronavirus 2 by RT PCR NEGATIVE NEGATIVE Final    Comment: (NOTE) SARS-CoV-2 target nucleic acids are NOT DETECTED.  The SARS-CoV-2  RNA is generally detectable in upper and lower respiratory specimens during the acute phase of infection. The lowest concentration of SARS-CoV-2 viral copies this assay can detect is 250 copies / mL. A negative result does not preclude SARS-CoV-2 infection and should not be used as the sole basis for treatment or other patient management decisions.  A negative result may occur with improper specimen collection / handling, submission of specimen other than nasopharyngeal swab, presence of viral mutation(s) within the areas targeted by this assay, and inadequate number of viral copies (<250 copies / mL). A negative result must be combined with clinical observations, patient history, and epidemiological information.  Fact Sheet for Patients:   RoadLapTop.co.za  Fact Sheet for Healthcare Providers: http://kim-miller.com/  This test is not yet approved or  cleared by the [place] FDA and has been authorized for detection and/or diagnosis of SARS-CoV-2 by FDA under an Emergency Use Authorization (EUA).  This EUA will remain in effect (meaning this test can be used) for the duration of the COVID-19 declaration under Section 564(b)(1) of the Act, 21 U.S.C. section 360bbb-3(b)(1), unless the authorization is terminated or revoked sooner.  Performed at [facility], [address], [place], [place] [address]   Urine Culture (for pregnant, neutropenic or urologic patients or patients with an indwelling urinary catheter)     Status: Abnormal   Collection Time: 02/13/23  8:35 AM   Specimen: Urine, Clean Catch  Result Value Ref Range Status   Specimen Description   Final    URINE, CLEAN CATCH Performed at [facility], [address], [place], [place] [address]    Special Requests   Final    NONE Performed at [facility], [address] [place], [place] [address]    Culture MULTIPLE  SPECIES PRESENT, SUGGEST RECOLLECTION (A)  Final   Report Status 02/14/2023 FINAL  Final    Labs: CBC: Recent Labs  Lab 02/10/23 1321 02/11/23 0444 02/12/23 0424  WBC 11.4* 9.7 9.4  HGB 9.6* 10.2* 9.6*  HCT 32.9* 35.3* 32.6*  MCV 73.4* 73.7* 73.9*  PLT 298 331 295   Basic Metabolic Panel: Recent Labs  Lab 02/08/23 0259 02/09/23 0337 02/10/23 0429 02/11/23 0444 02/12/23 0424 02/13/23 0349 02/14/23 0402  NA 133* 135 136 135 135 136 138  K 3.7 3.2* 3.4* 3.1* 3.3* 3.6 3.8  CL 96* 97* 97* 97* 100 100 101  CO2 27 30 29 28 26 26 27   GLUCOSE 118* 115* 118* 112* 113* 128* 164*  Bush 19 18 15 16  24* 30* 29*  CREATININE 1.02* 1.10* 1.09* 1.43* 1.98* 2.42* 1.72*  CALCIUM 8.7* 8.7* 8.8* 8.5* 8.3* 8.3* 8.8*  MG 1.8 1.7 1.8  --   --   --   --    Liver Function Tests: No results for input(s): "AST", "ALT", "ALKPHOS", "BILITOT", "PROT", "ALBUMIN" in the last 168 hours. CBG: No results for input(s): "GLUCAP" in the last 168 hours.  Discharge time spent: greater than 30 minutes.  Signed: Alba Cory, MD [facility] 02/14/2023

## 2023-02-14 NOTE — Progress Notes (Signed)
Mobility Specialist - Progress Note   02/14/23 1158  Mobility  Activity Ambulated with assistance in hallway  Level of Assistance Standby assist, set-up cues, supervision of patient - no hands on  Assistive Device None  Distance Ambulated (ft) 205 ft  Range of Motion/Exercises Active  Activity Response Tolerated well  Mobility Referral Yes  $Mobility charge 1 Mobility  Mobility Specialist Start Time (ACUTE ONLY) 1149  Mobility Specialist Stop Time (ACUTE ONLY) 1158  Mobility Specialist Time Calculation (min) (ACUTE ONLY) 9 min   Pt received in chair and agreed to mobility, had no issues throughout session, some discomfort in legs and hips. Returned to chair with all needs met.   Marilynne Halsted Mobility Specialist

## 2023-02-16 ENCOUNTER — Telehealth: Payer: Self-pay | Admitting: Internal Medicine

## 2023-02-16 NOTE — Telephone Encounter (Signed)
Pt c/o medication issue:  1. Name of Medication: Lasix  2. How are you currently taking this medication (dosage and times per day)?   3. Are you having a reaction (difficulty breathing--STAT)? No  4. What is your medication issue? Patient spoke with nurse this morning (See phone encounter 07/15). Patient is confused if she should take Lasix. Please advise.

## 2023-02-16 NOTE — Telephone Encounter (Signed)
Called patient phone got disconnected tried to call again and received busy signal x2

## 2023-02-16 NOTE — Telephone Encounter (Signed)
Pt c/o medication issue:  1. Name of Medication:   metoprolol succinate (TOPROL-XL) 25 MG 24 hr tablet    2. How are you currently taking this medication (dosage and times per day)?   3. Are you having a reaction (difficulty breathing--STAT)? No   4. What is your medication issue? Pt called in asking how is she supposed to be taking metoprolol since they changed her instructions in the hospital. She also asked if she is supposed to restart lasix. Please advise.

## 2023-02-16 NOTE — Telephone Encounter (Signed)
Spoke with pt regarding her medications that recently changed due to a hospitalization. Pt is wondering if she take metoprolol succinate 75mg  all at one time or at separate times. Advised that this medication should be taken all at one time. Pt states that she plans to restart lasix today because she is "swollen all over." Advised pt to be careful with lasix because of already compromised renal function. Pt verbalizes understanding. Confirmed pt is aware of follow up appointment for next week, 7/23 @ 8:25am.

## 2023-02-17 DIAGNOSIS — I77819 Aortic ectasia, unspecified site: Secondary | ICD-10-CM | POA: Diagnosis not present

## 2023-02-17 DIAGNOSIS — K5730 Diverticulosis of large intestine without perforation or abscess without bleeding: Secondary | ICD-10-CM | POA: Diagnosis not present

## 2023-02-17 DIAGNOSIS — I714 Abdominal aortic aneurysm, without rupture, unspecified: Secondary | ICD-10-CM | POA: Diagnosis not present

## 2023-02-17 DIAGNOSIS — K5190 Ulcerative colitis, unspecified, without complications: Secondary | ICD-10-CM | POA: Diagnosis not present

## 2023-02-17 DIAGNOSIS — G8929 Other chronic pain: Secondary | ICD-10-CM | POA: Diagnosis not present

## 2023-02-17 DIAGNOSIS — M797 Fibromyalgia: Secondary | ICD-10-CM | POA: Diagnosis not present

## 2023-02-17 DIAGNOSIS — I083 Combined rheumatic disorders of mitral, aortic and tricuspid valves: Secondary | ICD-10-CM | POA: Diagnosis not present

## 2023-02-17 DIAGNOSIS — K2289 Other specified disease of esophagus: Secondary | ICD-10-CM | POA: Diagnosis not present

## 2023-02-17 DIAGNOSIS — I2510 Atherosclerotic heart disease of native coronary artery without angina pectoris: Secondary | ICD-10-CM | POA: Diagnosis not present

## 2023-02-17 DIAGNOSIS — N1831 Chronic kidney disease, stage 3a: Secondary | ICD-10-CM | POA: Diagnosis not present

## 2023-02-17 DIAGNOSIS — Z7901 Long term (current) use of anticoagulants: Secondary | ICD-10-CM | POA: Diagnosis not present

## 2023-02-17 DIAGNOSIS — D631 Anemia in chronic kidney disease: Secondary | ICD-10-CM | POA: Diagnosis not present

## 2023-02-17 DIAGNOSIS — G9340 Encephalopathy, unspecified: Secondary | ICD-10-CM | POA: Diagnosis not present

## 2023-02-17 DIAGNOSIS — I5033 Acute on chronic diastolic (congestive) heart failure: Secondary | ICD-10-CM | POA: Diagnosis not present

## 2023-02-17 DIAGNOSIS — I739 Peripheral vascular disease, unspecified: Secondary | ICD-10-CM | POA: Diagnosis not present

## 2023-02-17 DIAGNOSIS — I700 Atherosclerosis of aorta: Secondary | ICD-10-CM | POA: Diagnosis not present

## 2023-02-17 DIAGNOSIS — J9811 Atelectasis: Secondary | ICD-10-CM | POA: Diagnosis not present

## 2023-02-17 DIAGNOSIS — G4700 Insomnia, unspecified: Secondary | ICD-10-CM | POA: Diagnosis not present

## 2023-02-17 DIAGNOSIS — G4733 Obstructive sleep apnea (adult) (pediatric): Secondary | ICD-10-CM | POA: Diagnosis not present

## 2023-02-17 DIAGNOSIS — I69328 Other speech and language deficits following cerebral infarction: Secondary | ICD-10-CM | POA: Diagnosis not present

## 2023-02-17 DIAGNOSIS — Z6833 Body mass index (BMI) 33.0-33.9, adult: Secondary | ICD-10-CM | POA: Diagnosis not present

## 2023-02-17 DIAGNOSIS — I130 Hypertensive heart and chronic kidney disease with heart failure and stage 1 through stage 4 chronic kidney disease, or unspecified chronic kidney disease: Secondary | ICD-10-CM | POA: Diagnosis not present

## 2023-02-17 DIAGNOSIS — E860 Dehydration: Secondary | ICD-10-CM | POA: Diagnosis not present

## 2023-02-17 DIAGNOSIS — Z8673 Personal history of transient ischemic attack (TIA), and cerebral infarction without residual deficits: Secondary | ICD-10-CM | POA: Diagnosis not present

## 2023-02-17 DIAGNOSIS — I712 Thoracic aortic aneurysm, without rupture, unspecified: Secondary | ICD-10-CM | POA: Diagnosis not present

## 2023-02-17 DIAGNOSIS — I480 Paroxysmal atrial fibrillation: Secondary | ICD-10-CM | POA: Diagnosis not present

## 2023-02-17 DIAGNOSIS — G9332 Myalgic encephalomyelitis/chronic fatigue syndrome: Secondary | ICD-10-CM | POA: Diagnosis not present

## 2023-02-17 NOTE — Telephone Encounter (Signed)
Call to patient.  No voice mail pick up.. Rings multiple times then hangs up

## 2023-02-17 NOTE — Telephone Encounter (Signed)
Patient contacted regarding discharge from [facility] on 02/14/23  Patient understands to follow up with provider D. Wittenborn, NP  on 02/24/23 at 8:25 at  [facility] Patient understands discharge instructions? Yes Patient understands medications and regiment? Yes Patient understands to bring all medications to this visit? Yes  Ask patient:  Are you enrolled in My Chart (yes or no)  If no ask patient if they would like to enroll.                  Do you have any questions about your medications?  All medications (except pain medications) are to be filled by your Cardiologist AFTER your first post op       appointment with them.  Are you taking your pain medication? None              How is your pain controlled? Pain level? Controlled              Do you have help at home with ADL's?  None needed.  Has PT in home

## 2023-02-18 DIAGNOSIS — N179 Acute kidney failure, unspecified: Secondary | ICD-10-CM | POA: Diagnosis not present

## 2023-02-18 DIAGNOSIS — Z8673 Personal history of transient ischemic attack (TIA), and cerebral infarction without residual deficits: Secondary | ICD-10-CM | POA: Diagnosis not present

## 2023-02-18 DIAGNOSIS — I10 Essential (primary) hypertension: Secondary | ICD-10-CM | POA: Diagnosis not present

## 2023-02-18 DIAGNOSIS — G479 Sleep disorder, unspecified: Secondary | ICD-10-CM | POA: Diagnosis not present

## 2023-02-18 DIAGNOSIS — I2510 Atherosclerotic heart disease of native coronary artery without angina pectoris: Secondary | ICD-10-CM | POA: Diagnosis not present

## 2023-02-18 DIAGNOSIS — Z09 Encounter for follow-up examination after completed treatment for conditions other than malignant neoplasm: Secondary | ICD-10-CM | POA: Diagnosis not present

## 2023-02-18 MED ORDER — FUROSEMIDE 40 MG PO TABS: 40.0000 mg | ORAL_TABLET | Freq: Every day | ORAL | Status: DC | PRN

## 2023-02-18 MED ORDER — POTASSIUM CHLORIDE ER 10 MEQ PO TBCR: 10.0000 meq | EXTENDED_RELEASE_TABLET | Freq: Every day | ORAL | Status: DC | PRN

## 2023-02-18 NOTE — Telephone Encounter (Signed)
 Left message for pt to call.

## 2023-02-18 NOTE — Telephone Encounter (Signed)
Spoke with pt, she is having some swelling in her feet and ankles. She reports the furosemide and potassium were given to her by dr Rennis Golden. Advised patient that her kidney function is elevated so she needs to let uas know if she is taking it more than once or twice a week. Patient voiced understanding.

## 2023-02-22 NOTE — Progress Notes (Unsigned)
Cardiology Clinic Note   Date: 02/24/2023 ID: SHANEIKA ROSSA, DOB 16-May-1941, MRN [id number]  Primary Cardiologist:  Chrystie Nose, MD  Patient Profile    Kara Bush is a 82 y.o. female who presents to the clinic today for hospital follow up.     Past medical history significant for: CAD. CABG x 3 1998. LHC 10/23/2003 (positive stress test): Mid LAD 90%.  Proximal LCx 80%.  Proximal RCA 100%.  LIMA to LAD very small basically nonfunctional.  Patent SVG to RCA.  Patent SVG to diagonal.  PCI with stent to mid LAD, stent to proximal LCx. LHC 09/04/2004 (NSTEMI): LAD 80 to 90% just after previously placed stent.  Widely patent stent LCx.  CTO mid RCA. Atretic LIMA to LAD.  Patent SVG to distal RCA and SVG to diagonal.  PCI with Cutting Balloon atherectomy and stent to LAD. LHC 07/08/2006 (unstable angina): Unchanged anatomy from previous cath with the exception of proximal SVG to diagonal 60%.  PCI with DES to proximal SVG to diagonal. Nuclear stress test 08/07/2011: No evidence of ischemia or infarct. Chronic HFpEF.  Echo 02/05/2023: EF 55 to 60%.  Indeterminate diastolic parameters.  Normal RV function.  Mild LAE.  Mild MR.  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 Aortic aneurysm.  S/p resection 1998. CTA chest abdomen pelvis 02/04/2023: 4.2 fusiform aneurysm of ascending thoracic aorta.  Recommend annual imaging for surveillance. PAF.  14-day ZIO 09/16/2018: 2999 runs of SVT, fastest 15 beats max rate 193 bpm, longest 18 beats average 111 bpm. <1% A-fib burden.  A-fib detected within +/- 45 seconds of symptomatic patient events. PAD.  S/p aortobiexternal iliac artery bypass graft 1991. Vascular ultrasound aorta/IVC/iliacs 12/17/2022: Suboptimal exam.  Bilateral external iliac arteries appear patent with an increased velocity at the proximal left EIA.  50% stenosis proximal aorta area. CTA chest abdomen pelvis 02/04/2023:  Aortobiiliac bypass graft appears patent. Hypertension.  Hyperlipidemia.  Lipid panel 02/05/2023: LDL 111, HDL 53, TG 94, total 183. CKD stage III.  Fibromyalgia.  OSA. Anemia.  PE. TIA. CVA. 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     History of Present Illness    Kara Bush is a longtime patient of cardiology.  She is followed by Dr. Rennis Golden for the above outlined history.  Patient was last seen in the office by Bernadene Person, NP 12/26/2022 for hospital follow-up.  She underwent hospital admission April 2024 for acute blood loss anemia for which she received several units of PRBC (hemoglobin 6 upon arrival to ED).  Eliquis was held and she underwent EGD which showed mild ulcerative colitis and a single nonbleeding colon lesion.  Eliquis was resumed at a reduced dose.  Cardiology was consulted for elevated troponin likely due to demand ischemia in the setting of anemia.  Echo showed normal LV/RV function, no RWMA, mild LVH, mild MR, moderate to severe TR, mild AI/AS.  She was referred to Dr. Lalla Brothers for consideration of watchman.   Patient was evaluated by Dr. Lalla Brothers on 01/23/2023 for consideration of watchman.  Given patient's history of unprovoked PE in 2014 a Watchman device would not illuminate need for anticoagulation.  Patient was instructed to continue Eliquis at 2.5 mg until outpatient visit with APP 02/06/2023 when she will then increase back to 5 mg twice daily as long as there is no evidence of recurrent anemia.  Patient presented to the ED on  02/04/2023 with complaints of nausea, disorientation, slurred speech.  Initial EKG showed changes concerning for ischemia and STEMI was called.  Cardiology consulted and STEMI canceled given slightly elevated but flat troponin-felt to be demand ischemia.  Potassium 2.1.  Hemoglobin normal.  Echo showed normal LV/RV function with no RWMA (details above).  MRI of the brain showed small acute infarct.   Cardiology consulted for patient complaints of dyspnea.  She was found to be in A-fib at the time of consult.  It was felt dyspnea more likely to chronic heart failure versus symptomatic A-fib.  Patient was started on Entresto and given IV Lasix with plan to restart home dose of p.o. Lasix.  Eliquis remains at reduced dose secondary to downtrending hemoglobin.  During hospital course creatinine worsened and Entresto/Lasix was held.  Patient was discharged on 02/14/2023.  Patient contacted the office on 02/16/2023 with questions regarding discharge medications.  She also reported being "swollen all over" and requested restarting Lasix.  Patient spoke to triage RN 02/18/2023 and reported edema of feet and ankles.  She was advised to contact the office if she needed to take more than 1-2 doses of Lasix.  Today, patient is here alone. She is doing well since discharge from hospital. She recently had labs drawn with her PCP on 02/18/2023 showing HGB 9.2 and CRT 1.380. She has been taking Lasix every 2-3 days to manage lower extremity edema. Her weight has been stable between 192-194 lb. She denies shortness of breath at rest. DOE is at baseline for her. She feels she is having more brief runs of afib. She does not get palpitations but feels "not right." She monitors her rhythm with an Apple watch. Episodes of afib last no more than a few minutes. She is hypertensive today with BP 200/80 on intake and 162/70 on my recheck. She has not had her medications this morning. SBP at home typically 140-150. She has been working with home PT twice a week and tolerating it well.     ROS: All other systems reviewed and are otherwise negative except as noted in History of Present Illness.  Studies Reviewed      No EKG ordered today.   Risk Assessment/Calculations     CHA2DS2-VASc Score = 5   This indicates a 7.2% annual risk of stroke. The patient's score is based upon: CHF History: 0 HTN History: 1 Diabetes History:  0 Stroke History: 0 Vascular Disease History: 1 Age Score: 2 Gender Score: 1    HYPERTENSION CONTROL Vitals:   02/24/23 0806 02/24/23 1018  BP: (!) 200/80 (!) 162/70    The patient's blood pressure is elevated above target today.  In order to address the patient's elevated BP: A current anti-hypertensive medication was adjusted today.           Physical Exam    VS:  BP (!) 200/80   Pulse 91   Ht 5\' 4"  (1.626 m)   Wt 195 lb (88.5 kg)   SpO2 90%   BMI 33.47 kg/m  , BMI Body mass index is 33.47 kg/m.  GEN: Well nourished, well developed, in no acute distress. Neck: No JVD or carotid bruits. Cardiac: Irregular rhythm on initial auscultation. RRR a few moments later. 3/6 systolic murmur. No rubs or gallops.   Respiratory:  Respirations regular and unlabored. Clear to auscultation without rales, wheezing or rhonchi. GI: Soft, nontender, nondistended. Extremities: Radials/DP/PT 2+ and equal bilaterally. No clubbing or cyanosis. Mild nonpitting edema bilateral lower extremities.  Skin: Warm and dry, no rash. Neuro: Strength intact.  Assessment & Plan    CAD.  S/p CABG x 3 1998.  Multiple PCI with stent 2005, 2006, 2007 (outlined in patient profile). Patient denies chest pain or shortness of breath at rest. Continue metoprolol, isosorbide.  Chronic HFpEF.  Echo July 2024 showed normal LV/RV function, mild LAE, mild MR, mild AI, moderate AS.  Entresto and diuretics held during recent hospital admission secondary to elevated creatinine.  Patient contacted the office post hospital discharge secondary to bilateral lower extremity edema requesting to restart Lasix.  She was advised to take as needed and contact the office if she needed more than 1-2 doses. Patient continues to manage lower extremity edema with elevation and as needed Lasix. Last dose of Lasix 2 days ago. Home weight stable at 192-194 lb. She reports DOE is at baseline for her occurring with heavier exertion such as  walking up an incline or climbing stairs. Mild, nonpitting edema bilateral lower extremities otherwise euvolemic and well compensated on exam. Continue furosemide, hydralazine (see #4), isosorbide, metoprolol.  Recent BMP 02/18/2023 by PCP showed CRT 1.380. Will hold off on adding ARB/ARNI for now.  PAF.  Patient has no palpitation with arrhythmia but feels "not right" when she goes into afib.  Apple Watch indicates brief runs of afib lasting no more than a few minutes.  Patient taking reduced dose of Eliquis since April 2024 secondary to GI bleed.  Plan was to restart full dose of Eliquis in July however hemoglobin was downtrending during hospital admission. Recent HGB 02/17/2026 9.2.  Patient denies spontaneous bleeding concerns.  Irregular rhythm on initial auscultation. Patient ran EKG on Apple Watch which showed sinus rhythm. She was then RRR on auscultation a few minutes later.  Continue metoprolol and Eliquis at current dose, as HGB still on the low side.  Hypertension: BP today 200/80 on intake and 162/70 on my recheck. Patient did not take her medication this morning. She denies headache or vision changes. Home SBP typically 140-150. She states it used to be better controlled. Will increase hydralazine to 50 mg tid. She will continue to monitor her BP and contact the office if it begins trending down. Continue isosorbide and metoprolol. Hyperlipidemia.  LDL July 2024 111, not at goal.  Continue atorvastatin.  Disposition: Increase hydralazine to 50 mg tid. Return for previously scheduled visit with Dr. Rennis Golden on 06/12/2023 or sooner as needed.          Signed, Etta Grandchild. Railyn House, DNP, NP-C

## 2023-02-24 ENCOUNTER — Ambulatory Visit: Payer: PPO | Attending: Student | Admitting: Student

## 2023-02-24 ENCOUNTER — Encounter: Payer: Self-pay | Admitting: Student

## 2023-02-24 VITALS — BP 162/70 | HR 91 | Ht 64.0 in | Wt 195.0 lb

## 2023-02-24 DIAGNOSIS — I480 Paroxysmal atrial fibrillation: Secondary | ICD-10-CM | POA: Diagnosis not present

## 2023-02-24 DIAGNOSIS — I2510 Atherosclerotic heart disease of native coronary artery without angina pectoris: Secondary | ICD-10-CM

## 2023-02-24 DIAGNOSIS — I10 Essential (primary) hypertension: Secondary | ICD-10-CM | POA: Diagnosis not present

## 2023-02-24 DIAGNOSIS — I5032 Chronic diastolic (congestive) heart failure: Secondary | ICD-10-CM

## 2023-02-24 DIAGNOSIS — E785 Hyperlipidemia, unspecified: Secondary | ICD-10-CM

## 2023-02-24 MED ORDER — HYDRALAZINE HCL 50 MG PO TABS: 50.0000 mg | ORAL_TABLET | Freq: Three times a day (TID) | ORAL | 3 refills | Status: DC

## 2023-02-24 NOTE — Patient Instructions (Signed)
Medication Instructions:  Increase Hydralazine to 50 mg ( Take 1 Tablet 3 Time Daily). *If you need a refill on your cardiac medications before your next appointment, please call your pharmacy*   Lab Work: No Labs If you have labs (blood work) drawn today and your tests are completely normal, you will receive your results only by: MyChart Message (if you have MyChart) OR A paper copy in the mail If you have any lab test that is abnormal or we need to change your treatment, we will call you to review the results.   Testing/Procedures: No Testing   Follow-Up: At [facility], you and your health needs are our priority.  As part of our continuing mission to provide you with exceptional heart care, we have created designated Provider Care Teams.  These Care Teams include your primary Cardiologist (physician) and Advanced Practice Providers (APPs -  Physician Assistants and Nurse Practitioners) who all work together to provide you with the care you need, when you need it.  We recommend signing up for the patient portal called "MyChart".  Sign up information is provided on this After Visit Summary.  MyChart is used to connect with patients for Virtual Visits (Telemedicine).  Patients are able to view lab/test results, encounter notes, upcoming appointments, etc.  Non-urgent messages can be sent to your provider as well.   To learn more about what you can do with MyChart, go to [email or web address].    Your next appointment:   Keep Scheduled Appointment  Provider:   Chrystie Nose, MD

## 2023-02-25 ENCOUNTER — Encounter: Payer: Self-pay | Admitting: Registered Nurse

## 2023-02-25 ENCOUNTER — Encounter: Payer: PPO | Attending: Registered Nurse | Admitting: Registered Nurse

## 2023-02-25 VITALS — BP 160/67 | HR 80 | Ht 64.0 in | Wt 193.8 lb

## 2023-02-25 DIAGNOSIS — M25562 Pain in left knee: Secondary | ICD-10-CM | POA: Diagnosis not present

## 2023-02-25 DIAGNOSIS — M797 Fibromyalgia: Secondary | ICD-10-CM | POA: Diagnosis not present

## 2023-02-25 DIAGNOSIS — G8929 Other chronic pain: Secondary | ICD-10-CM | POA: Diagnosis not present

## 2023-02-25 DIAGNOSIS — G894 Chronic pain syndrome: Secondary | ICD-10-CM

## 2023-02-25 DIAGNOSIS — Z5181 Encounter for therapeutic drug level monitoring: Secondary | ICD-10-CM | POA: Diagnosis not present

## 2023-02-25 DIAGNOSIS — Z79891 Long term (current) use of opiate analgesic: Secondary | ICD-10-CM | POA: Diagnosis not present

## 2023-02-25 DIAGNOSIS — M25561 Pain in right knee: Secondary | ICD-10-CM | POA: Diagnosis not present

## 2023-02-25 DIAGNOSIS — M545 Low back pain, unspecified: Secondary | ICD-10-CM | POA: Diagnosis not present

## 2023-02-25 MED ORDER — HYDROCODONE-ACETAMINOPHEN 5-325 MG PO TABS
1.0000 | ORAL_TABLET | Freq: Four times a day (QID) | ORAL | 0 refills | Status: DC | PRN
Start: 2023-02-25 — End: 2023-02-25

## 2023-02-25 MED ORDER — HYDROCODONE-ACETAMINOPHEN 5-325 MG PO TABS
1.0000 | ORAL_TABLET | Freq: Four times a day (QID) | ORAL | 0 refills | Status: DC | PRN
Start: 2023-02-25 — End: 2023-04-22

## 2023-02-25 NOTE — Progress Notes (Signed)
Subjective:    Patient ID: Kara Bush, female    DOB: [birth year], 82 y.o.   MRN: [id number]  HPI: Kara Bush is a 82 y.o. female who returns for follow up appointment for chronic pain and medication refill. She states her pain is located in her mid- lower back and bilateral knee pain. She rates her pain 4. Her current exercise regime is  receiving Home physical Therapy two days a week and walking.  Kara Bush was admitted to [facility] on 02/04/2023 and discharged on 02/14/2023, discharge summary was reviewed.    Kara Bush Morphine equivalent is 25.00 MME.   Last UDS was Performed on 10/17/2022, it was consistent.     Pain Inventory Average Pain 4 Pain Right Now 4 My pain is constant and aching  In the last 24 hours, has pain interfered with the following? General activity 2 Relation with others 0 Enjoyment of life 4 What TIME of day is your pain at its worst? morning , daytime, evening, and night Sleep (in general) Poor  Pain is worse with: walking and bending Pain improves with: rest and medication Relief from Meds: 5  walk without assistance how many minutes can you walk? 1-2 ability to climb steps?  yes do you drive?  yes  I need assistance with the following:  household duties and shopping  No problems in this area  Any changes since last visit?  no  Any changes since last visit?  no    Family History  Problem Relation Age of Onset   Heart disease Father    Heart attack Father        @ 36   Cancer Paternal Aunt        BREAST   Heart failure Mother        at 20   Healthy Sister    Cancer Brother        pancreatic    Heart failure Maternal Grandmother    Heart attack Maternal Grandfather    Heart attack Paternal Grandmother        at 52   Healthy Daughter    Healthy Daughter    Social History   Socioeconomic History   Marital status: Divorced    Spouse name: Not on file   Number of children: 2   Years of education:  college    Highest education level: Not on file  Occupational History    Employer: RETIRED  Tobacco Use   Smoking status: Former    Current packs/day: 0.00    Average packs/day: 2.5 packs/day for 20.0 years (50.0 ttl pk-yrs)    Types: Cigarettes    Start date: 06/29/1970    Quit date: 06/29/1990    Years since quitting: 32.6    Passive exposure: Never   Smokeless tobacco: Never  Vaping Use   Vaping status: Never Used  Substance and Sexual Activity   Alcohol use: No   Drug use: No   Sexual activity: Not on file  Other Topics Concern   Not on file  Social History Narrative   Not on file   Social Determinants of Health   Financial Resource Strain: Not on file  Food Insecurity: No Food Insecurity (02/08/2023)   Hunger Vital Sign    Worried About Running Out of Food in the Last Year: Never true    Ran Out of Food in the Last Year: Never true  Transportation Needs: No Transportation Needs (02/08/2023)   PRAPARE - Transportation  Lack of Transportation (Medical): No    Lack of Transportation (Non-Medical): No  Physical Activity: Not on file  Stress: Not on file  Social Connections: Not on file   Past Surgical History:  Procedure Laterality Date   AORTOBIEXTERNAL ILIAC BYPASS  1991   BIOPSY  11/25/2022   Procedure: BIOPSY;  Surgeon: Jeani Hawking, MD;  Location: [facility] ENDOSCOPY;  Service: Gastroenterology;;   CATARACT EXTRACTION Left 09/2019   CHOLECYSTECTOMY  1997   COLONOSCOPY WITH PROPOFOL N/A 11/25/2022   Procedure: COLONOSCOPY WITH PROPOFOL;  Surgeon: Jeani Hawking, MD;  Location: [facility] ENDOSCOPY;  Service: Gastroenterology;  Laterality: N/A;   CORONARY ANGIOPLASTY WITH STENT PLACEMENT  2006   to LAD, ATRETIC LIMA AT THAT TIME ALSO   CORONARY ANGIOPLASTY WITH STENT PLACEMENT  2007   STENT TO PROX VG-DIAG, OTHER STENTS PATENT   CORONARY ARTERY BYPASS GRAFT  1998   LIMA-LAD;VG-DIAG & RCA   CORONARY STENT PLACEMENT  2005   TO LAD & LCX-STENTS,   ESOPHAGOGASTRODUODENOSCOPY  (EGD) WITH PROPOFOL N/A 11/25/2022   Procedure: ESOPHAGOGASTRODUODENOSCOPY (EGD) WITH PROPOFOL;  Surgeon: Jeani Hawking, MD;  Location: [facility] ENDOSCOPY;  Service: Gastroenterology;  Laterality: N/A;   HERNIA REPAIR     TUBAL LIGATION     BTL   Past Medical History:  Diagnosis Date   Abdominal aortic aneurysm (HCC)    Anemia, improved 01/11/2013   Basal cell carcinoma    CAD (coronary artery disease)    hx CABG 1998, last cath 2007 with PCI and stenting to the proximal segment of the vein graft to the diagonal branch. DES Taxus was placed   Cervicalgia    Chronic fatigue fibromyalgia syndrome    Chronic pain syndrome    Depression    Fibromyalgia    History of nuclear stress test 08/07/2011   lexiscan; no evidence of ischemia or infarct; low risk    Hyperlipidemia    Hypertension    OSA (obstructive sleep apnea)    PAD (peripheral artery disease) (HCC)    Pes anserine bursitis    Pulmonary embolism (HCC) 01/11/2013   S/P resection of aortic aneurysm    Trochanteric bursitis    Ulcerative colitis (HCC)    BP (!) 160/67   Pulse 80   Ht 5\' 4"  (1.626 m)   Wt 195 lb (88.5 kg)   SpO2 92%   BMI 33.47 kg/m   Opioid Risk Score:   Fall Risk Score:  `1  Depression screen PHQ 2/9     02/25/2023    1:31 PM 10/17/2022    1:38 PM 08/18/2022    1:15 PM 06/23/2022    1:03 PM 02/19/2022    1:40 PM 10/21/2021   11:08 AM 06/24/2021   10:48 AM  Depression screen PHQ 2/9  Decreased Interest 0 0 0 1 0 0 0  Down, Depressed, Hopeless 0 0 0 1 0 0 0  PHQ - 2 Score 0 0 0 2 0 0 0     Review of Systems  Constitutional: Negative.   HENT: Negative.    Eyes: Negative.   Respiratory: Negative.    Cardiovascular: Negative.   Gastrointestinal: Negative.   Endocrine: Negative.   Genitourinary: Negative.   Musculoskeletal:  Positive for arthralgias, back pain and myalgias.  Skin: Negative.   Allergic/Immunologic: Negative.   Neurological: Negative.   Hematological:  Bruises/bleeds easily.        Eliquis  Psychiatric/Behavioral: Negative.    All other systems reviewed and are negative.  Objective:   Physical Exam Vitals and nursing note reviewed.  Constitutional:      Appearance: Normal appearance.  Cardiovascular:     Rate and Rhythm: Normal rate and regular rhythm.     Pulses: Normal pulses.     Heart sounds: Normal heart sounds.  Pulmonary:     Effort: Pulmonary effort is normal.     Breath sounds: Normal breath sounds.  Musculoskeletal:     Cervical back: Normal range of motion and neck supple.     Comments: Normal Muscle Bulk and Muscle Testing Reveals:  Upper Extremities: Right: Decreased ROM 90 Degrees and Muscle Strength 5/5 Left Upper Extremity: Full ROM and Muscle Strength 5/5 Thoracic Paraspinal Tenderness: T-7-T-9 Mainly Right Side Lumbar Paraspinal Tenderness: L-4-L-5 Bilateral Greater Trochanter Tenderness Lower Extremities: Full ROM and Muscle Strength 5/5 Arises from Chair slowly Narrow Based  Gait     Skin:    General: Skin is warm and dry.  Neurological:     Mental Status: She is alert and oriented to person, place, and time.  Psychiatric:        Mood and Affect: Mood normal.        Behavior: Behavior normal.         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 02/25/2023 2. Fibromyalgia: Continue  Home Exercise Program. Continue to Monitor.02/25/2023 3. Bilateral intermittent hand numbness: Carpal tunnel syndrome versus C6 radiculopathy mild: No complaints today. Continue to Monitor. 02/25/2023 4. Chronic Midline Low Back Pain/ LBP: Refilled: Norco 5/325mg  # 140 pills--use one pill every 6 hours as needed for pain. A second prescription was e-scribe for the following month 02/25/2023 We will continue the opioid monitoring program, this consists of regular clinic visits, examinations, urine drug screen, pill counts as  well as use of [place] Controlled Substance Reporting system. A 12 month History has been reviewed on the [place] Controlled Substance Reporting System on 02/25/2023. 5. Muscle Spasm: Continue current medication regimen with  Robaxin. 02/25/2023 6.Bilateral  Greater Trochanteric Bursitis:Continue to Alternate with heat and ice therapy. Continue current medication regime. 02/25/2023  7. Left Foot pain: No complaints today.Continue with HEP as Tolerated. Continue to Monitor. 02/25/2023 8. Right Lateral Epicondylitis Pain: No complaints Today. Continue HEP as Tolerated. Continue to Monitor. 02/25/2023 9. Right Shoulder Tendonitis:  No complaints today. Continue Current Medication Regimen. Continue to Alternate Ice and Heat Therapy. 02/25/2023 10 Bilateral Chronic Knee Pain: .  Continue HEP as Tolerated. Continue to Monitor. 02/25/2023   F/U in 2 months

## 2023-02-27 ENCOUNTER — Telehealth: Payer: Self-pay | Admitting: Internal Medicine

## 2023-02-27 NOTE — Telephone Encounter (Signed)
  Pt c/o Shortness Of Breath: STAT if SOB developed within the last 24 hours or pt is noticeably SOB on the phone  1. Are you currently SOB (can you hear that pt is SOB on the phone)? No   2. How long have you been experiencing SOB?   3. Are you SOB when sitting or when up moving around? when up moving around  4. Are you currently experiencing any other symptoms? No   The patient mentioned that there has been no improvement with her shortness of breath and would like to get Dr. Blanchie Dessert recommendations.

## 2023-02-27 NOTE — Telephone Encounter (Signed)
Patient states continued SOB with activity.  She states worse since visit.  BP elevated 160's to 180's. Has not taken Lasix in several days.  She states took a dose Tuesday or Wednesday. And several days prior to that.  Weight this AM while on phone so not dry weight as 196 Yesterday no weight Wednesday 195 21 st 194 in the morning and 192 in the evening after Lasix   Advised to take Lasix today and tomorrow and weigh Daily .  She has potassium to take with her Lasix.  Call Monday with weights.  ED precautions given if SOB continues to distress.  She states understanding.   Please advise if any other recommendations, otherwise she will call  Monday

## 2023-02-27 NOTE — Telephone Encounter (Signed)
No changes to recommendations. Will look for weight on Monday.  Thank you!  DW

## 2023-03-02 ENCOUNTER — Encounter (HOSPITAL_COMMUNITY): Payer: Self-pay | Admitting: Internal Medicine

## 2023-03-02 ENCOUNTER — Inpatient Hospital Stay (HOSPITAL_COMMUNITY)
Admission: EM | Admit: 2023-03-02 | Discharge: 2023-03-07 | DRG: 291 | Disposition: A | Discharge status: Home / Self Care | Payer: PPO | Attending: Family Medicine | Admitting: Family Medicine

## 2023-03-02 ENCOUNTER — Other Ambulatory Visit: Payer: Self-pay

## 2023-03-02 ENCOUNTER — Emergency Department (HOSPITAL_COMMUNITY): Payer: PPO

## 2023-03-02 DIAGNOSIS — Z86711 Personal history of pulmonary embolism: Secondary | ICD-10-CM

## 2023-03-02 DIAGNOSIS — F32A Depression, unspecified: Secondary | ICD-10-CM | POA: Diagnosis present

## 2023-03-02 DIAGNOSIS — I130 Hypertensive heart and chronic kidney disease with heart failure and stage 1 through stage 4 chronic kidney disease, or unspecified chronic kidney disease: Secondary | ICD-10-CM | POA: Diagnosis not present

## 2023-03-02 DIAGNOSIS — Z8249 Family history of ischemic heart disease and other diseases of the circulatory system: Secondary | ICD-10-CM

## 2023-03-02 DIAGNOSIS — Z888 Allergy status to other drugs, medicaments and biological substances status: Secondary | ICD-10-CM

## 2023-03-02 DIAGNOSIS — R0689 Other abnormalities of breathing: Secondary | ICD-10-CM | POA: Diagnosis not present

## 2023-03-02 DIAGNOSIS — I5042 Chronic combined systolic (congestive) and diastolic (congestive) heart failure: Secondary | ICD-10-CM | POA: Diagnosis not present

## 2023-03-02 DIAGNOSIS — E876 Hypokalemia: Secondary | ICD-10-CM | POA: Diagnosis present

## 2023-03-02 DIAGNOSIS — E785 Hyperlipidemia, unspecified: Secondary | ICD-10-CM | POA: Diagnosis present

## 2023-03-02 DIAGNOSIS — I160 Hypertensive urgency: Secondary | ICD-10-CM | POA: Diagnosis not present

## 2023-03-02 DIAGNOSIS — E669 Obesity, unspecified: Secondary | ICD-10-CM | POA: Diagnosis not present

## 2023-03-02 DIAGNOSIS — Z955 Presence of coronary angioplasty implant and graft: Secondary | ICD-10-CM

## 2023-03-02 DIAGNOSIS — Z66 Do not resuscitate: Secondary | ICD-10-CM | POA: Diagnosis present

## 2023-03-02 DIAGNOSIS — Z1152 Encounter for screening for COVID-19: Secondary | ICD-10-CM | POA: Diagnosis not present

## 2023-03-02 DIAGNOSIS — N1832 Chronic kidney disease, stage 3b: Secondary | ICD-10-CM | POA: Diagnosis not present

## 2023-03-02 DIAGNOSIS — G4733 Obstructive sleep apnea (adult) (pediatric): Secondary | ICD-10-CM | POA: Diagnosis present

## 2023-03-02 DIAGNOSIS — I350 Nonrheumatic aortic (valve) stenosis: Secondary | ICD-10-CM | POA: Diagnosis present

## 2023-03-02 DIAGNOSIS — Z79899 Other long term (current) drug therapy: Secondary | ICD-10-CM

## 2023-03-02 DIAGNOSIS — I2510 Atherosclerotic heart disease of native coronary artery without angina pectoris: Secondary | ICD-10-CM | POA: Diagnosis present

## 2023-03-02 DIAGNOSIS — I5043 Acute on chronic combined systolic (congestive) and diastolic (congestive) heart failure: Secondary | ICD-10-CM | POA: Diagnosis present

## 2023-03-02 DIAGNOSIS — I7121 Aneurysm of the ascending aorta, without rupture: Secondary | ICD-10-CM | POA: Diagnosis not present

## 2023-03-02 DIAGNOSIS — R0602 Shortness of breath: Secondary | ICD-10-CM | POA: Diagnosis not present

## 2023-03-02 DIAGNOSIS — Z880 Allergy status to penicillin: Secondary | ICD-10-CM

## 2023-03-02 DIAGNOSIS — I2699 Other pulmonary embolism without acute cor pulmonale: Secondary | ICD-10-CM | POA: Insufficient documentation

## 2023-03-02 DIAGNOSIS — Z9842 Cataract extraction status, left eye: Secondary | ICD-10-CM

## 2023-03-02 DIAGNOSIS — D509 Iron deficiency anemia, unspecified: Secondary | ICD-10-CM | POA: Diagnosis present

## 2023-03-02 DIAGNOSIS — Z7901 Long term (current) use of anticoagulants: Secondary | ICD-10-CM

## 2023-03-02 DIAGNOSIS — I10 Essential (primary) hypertension: Secondary | ICD-10-CM | POA: Diagnosis not present

## 2023-03-02 DIAGNOSIS — Z8673 Personal history of transient ischemic attack (TIA), and cerebral infarction without residual deficits: Secondary | ICD-10-CM

## 2023-03-02 DIAGNOSIS — I5023 Acute on chronic systolic (congestive) heart failure: Secondary | ICD-10-CM | POA: Diagnosis present

## 2023-03-02 DIAGNOSIS — I5033 Acute on chronic diastolic (congestive) heart failure: Secondary | ICD-10-CM | POA: Diagnosis present

## 2023-03-02 DIAGNOSIS — Z951 Presence of aortocoronary bypass graft: Secondary | ICD-10-CM

## 2023-03-02 DIAGNOSIS — G894 Chronic pain syndrome: Secondary | ICD-10-CM | POA: Diagnosis present

## 2023-03-02 DIAGNOSIS — Z85828 Personal history of other malignant neoplasm of skin: Secondary | ICD-10-CM

## 2023-03-02 DIAGNOSIS — R7989 Other specified abnormal findings of blood chemistry: Secondary | ICD-10-CM | POA: Diagnosis present

## 2023-03-02 DIAGNOSIS — I739 Peripheral vascular disease, unspecified: Secondary | ICD-10-CM | POA: Diagnosis present

## 2023-03-02 DIAGNOSIS — Z882 Allergy status to sulfonamides status: Secondary | ICD-10-CM

## 2023-03-02 DIAGNOSIS — I214 Non-ST elevation (NSTEMI) myocardial infarction: Secondary | ICD-10-CM | POA: Diagnosis not present

## 2023-03-02 DIAGNOSIS — E873 Alkalosis: Secondary | ICD-10-CM | POA: Diagnosis not present

## 2023-03-02 DIAGNOSIS — N183 Chronic kidney disease, stage 3 unspecified: Secondary | ICD-10-CM | POA: Diagnosis present

## 2023-03-02 DIAGNOSIS — I480 Paroxysmal atrial fibrillation: Secondary | ICD-10-CM | POA: Diagnosis present

## 2023-03-02 DIAGNOSIS — Z87891 Personal history of nicotine dependence: Secondary | ICD-10-CM

## 2023-03-02 DIAGNOSIS — M797 Fibromyalgia: Secondary | ICD-10-CM | POA: Diagnosis present

## 2023-03-02 DIAGNOSIS — I2489 Other forms of acute ischemic heart disease: Secondary | ICD-10-CM | POA: Diagnosis not present

## 2023-03-02 DIAGNOSIS — Z6832 Body mass index (BMI) 32.0-32.9, adult: Secondary | ICD-10-CM

## 2023-03-02 LAB — BRAIN NATRIURETIC PEPTIDE: B Natriuretic Peptide: 671.2 pg/mL — ABNORMAL HIGH (ref 0.0–100.0)

## 2023-03-02 LAB — COMPREHENSIVE METABOLIC PANEL
ALT: 14 U/L (ref 0–44)
AST: 19 U/L (ref 15–41)
Albumin: 3.5 g/dL (ref 3.5–5.0)
Alkaline Phosphatase: 52 U/L (ref 38–126)
Anion gap: 12 (ref 5–15)
BUN: 16 mg/dL (ref 8–23)
CO2: 30 mmol/L (ref 22–32)
Calcium: 8.8 mg/dL — ABNORMAL LOW (ref 8.9–10.3)
Chloride: 96 mmol/L — ABNORMAL LOW (ref 98–111)
Creatinine, Ser: 1.33 mg/dL — ABNORMAL HIGH (ref 0.44–1.00)
GFR, Estimated: 40 mL/min — ABNORMAL LOW (ref >60)
Glucose, Bld: 115 mg/dL — ABNORMAL HIGH (ref 70–99)
Potassium: 2.6 mmol/L — CL (ref 3.5–5.1)
Sodium: 138 mmol/L (ref 135–145)
Total Bilirubin: 0.6 mg/dL (ref 0.3–1.2)
Total Protein: 6.8 g/dL (ref 6.5–8.1)

## 2023-03-02 LAB — CBC WITH DIFFERENTIAL/PLATELET
Abs Immature Granulocytes: 0.02 10*3/uL (ref 0.00–0.07)
Basophils Absolute: 0 10*3/uL (ref 0.0–0.1)
Basophils Relative: 1 %
Eosinophils Absolute: 0.3 10*3/uL (ref 0.0–0.5)
Eosinophils Relative: 3 %
HCT: 30.1 % — ABNORMAL LOW (ref 36.0–46.0)
Hemoglobin: 9 g/dL — ABNORMAL LOW (ref 12.0–15.0)
Immature Granulocytes: 0 %
Lymphocytes Relative: 14 %
Lymphs Abs: 1.2 10*3/uL (ref 0.7–4.0)
MCH: 21.1 pg — ABNORMAL LOW (ref 26.0–34.0)
MCHC: 29.9 g/dL — ABNORMAL LOW (ref 30.0–36.0)
MCV: 70.5 fL — ABNORMAL LOW (ref 80.0–100.0)
Monocytes Absolute: 0.9 10*3/uL (ref 0.1–1.0)
Monocytes Relative: 10 %
Neutro Abs: 6.1 10*3/uL (ref 1.7–7.7)
Neutrophils Relative %: 72 %
Platelets: 213 10*3/uL (ref 150–400)
RBC: 4.27 MIL/uL (ref 3.87–5.11)
RDW: 17.9 % — ABNORMAL HIGH (ref 11.5–15.5)
WBC: 8.4 10*3/uL (ref 4.0–10.5)
nRBC: 0 % (ref 0.0–0.2)

## 2023-03-02 LAB — MAGNESIUM: Magnesium: 1.6 mg/dL — ABNORMAL LOW (ref 1.7–2.4)

## 2023-03-02 LAB — BLOOD GAS, VENOUS
Acid-Base Excess: 17.2 mmol/L — ABNORMAL HIGH (ref 0.0–2.0)
Bicarbonate: 40.1 mmol/L — ABNORMAL HIGH (ref 20.0–28.0)
O2 Saturation: 99 %
Patient temperature: 37
pCO2, Ven: 39 mmHg — ABNORMAL LOW (ref 44–60)
pH, Ven: 7.62 (ref 7.25–7.43)
pO2, Ven: 164 mmHg — ABNORMAL HIGH (ref 32–45)

## 2023-03-02 LAB — RESP PANEL BY RT-PCR (RSV, FLU A&B, COVID)  RVPGX2
Influenza A by PCR: NEGATIVE
Influenza B by PCR: NEGATIVE
Resp Syncytial Virus by PCR: NEGATIVE
SARS Coronavirus 2 by RT PCR: NEGATIVE

## 2023-03-02 LAB — TROPONIN I (HIGH SENSITIVITY)
Troponin I (High Sensitivity): 24 ng/L — ABNORMAL HIGH (ref <18)
Troponin I (High Sensitivity): 24 ng/L — ABNORMAL HIGH (ref <18)

## 2023-03-02 MED ORDER — IPRATROPIUM-ALBUTEROL 0.5-2.5 (3) MG/3ML IN SOLN
3.0000 mL | Freq: Four times a day (QID) | RESPIRATORY_TRACT | Status: DC | PRN
  Administered 2023-03-02: 3 mL via RESPIRATORY_TRACT
  Filled 2023-03-02: qty 3

## 2023-03-02 MED ORDER — CITALOPRAM HYDROBROMIDE 20 MG PO TABS
20.0000 mg | ORAL_TABLET | Freq: Every day | ORAL | Status: DC
  Administered 2023-03-03 – 2023-03-07 (×5): 20 mg via ORAL
  Filled 2023-03-02 (×5): qty 1

## 2023-03-02 MED ORDER — MORPHINE SULFATE (PF) 2 MG/ML IV SOLN: 1.0000 mg | Freq: Once | INTRAVENOUS | Status: DC | PRN

## 2023-03-02 MED ORDER — HYDROCODONE-ACETAMINOPHEN 5-325 MG PO TABS
1.0000 | ORAL_TABLET | Freq: Four times a day (QID) | ORAL | Status: DC | PRN
  Administered 2023-03-02 – 2023-03-03 (×5): 1 via ORAL
  Administered 2023-03-04: 1.5 via ORAL
  Administered 2023-03-04 – 2023-03-07 (×12): 1 via ORAL
  Filled 2023-03-02 (×8): qty 1
  Filled 2023-03-02: qty 2
  Filled 2023-03-02 (×10): qty 1

## 2023-03-02 MED ORDER — ACETAMINOPHEN 325 MG PO TABS
650.0000 mg | ORAL_TABLET | Freq: Four times a day (QID) | ORAL | Status: DC | PRN
  Administered 2023-03-02 – 2023-03-07 (×15): 650 mg via ORAL
  Filled 2023-03-02 (×16): qty 2

## 2023-03-02 MED ORDER — MAGNESIUM SULFATE 2 GM/50ML IV SOLN
2.0000 g | Freq: Once | INTRAVENOUS | Status: AC
  Administered 2023-03-02: 2 g via INTRAVENOUS
  Filled 2023-03-02: qty 50

## 2023-03-02 MED ORDER — ACETAMINOPHEN 650 MG RE SUPP: 650.0000 mg | Freq: Four times a day (QID) | RECTAL | Status: DC | PRN

## 2023-03-02 MED ORDER — HYDRALAZINE HCL 20 MG/ML IJ SOLN
10.0000 mg | INTRAMUSCULAR | Status: DC | PRN
  Administered 2023-03-03 – 2023-03-04 (×2): 10 mg via INTRAVENOUS
  Filled 2023-03-02 (×2): qty 1

## 2023-03-02 MED ORDER — ONDANSETRON HCL 4 MG/2ML IJ SOLN
4.0000 mg | Freq: Four times a day (QID) | INTRAMUSCULAR | Status: DC | PRN
  Administered 2023-03-02 – 2023-03-05 (×4): 4 mg via INTRAVENOUS
  Filled 2023-03-02 (×4): qty 2

## 2023-03-02 MED ORDER — POTASSIUM CHLORIDE 10 MEQ/100ML IV SOLN: 10.0000 meq | Freq: Once | INTRAVENOUS | Status: DC

## 2023-03-02 MED ORDER — FUROSEMIDE 10 MG/ML IJ SOLN
60.0000 mg | Freq: Once | INTRAMUSCULAR | Status: AC
  Administered 2023-03-02: 60 mg via INTRAVENOUS
  Filled 2023-03-02: qty 8

## 2023-03-02 MED ORDER — ATORVASTATIN CALCIUM 40 MG PO TABS
40.0000 mg | ORAL_TABLET | Freq: Every day | ORAL | Status: DC
  Administered 2023-03-03 – 2023-03-07 (×5): 40 mg via ORAL
  Filled 2023-03-02 (×5): qty 1

## 2023-03-02 MED ORDER — APIXABAN 2.5 MG PO TABS
2.5000 mg | ORAL_TABLET | Freq: Two times a day (BID) | ORAL | Status: DC
  Administered 2023-03-02 – 2023-03-03 (×2): 2.5 mg via ORAL
  Filled 2023-03-02 (×2): qty 1

## 2023-03-02 MED ORDER — METOPROLOL SUCCINATE ER 50 MG PO TB24
75.0000 mg | ORAL_TABLET | Freq: Every day | ORAL | Status: DC
  Administered 2023-03-03 – 2023-03-07 (×5): 75 mg via ORAL
  Filled 2023-03-02 (×5): qty 1

## 2023-03-02 MED ORDER — TRAZODONE HCL 50 MG PO TABS
50.0000 mg | ORAL_TABLET | Freq: Every evening | ORAL | Status: DC | PRN
  Administered 2023-03-02 – 2023-03-06 (×5): 50 mg via ORAL
  Filled 2023-03-02 (×5): qty 1

## 2023-03-02 MED ORDER — HYDRALAZINE HCL 50 MG PO TABS
50.0000 mg | ORAL_TABLET | Freq: Three times a day (TID) | ORAL | Status: DC
  Administered 2023-03-02 – 2023-03-05 (×9): 50 mg via ORAL
  Filled 2023-03-02 (×9): qty 1

## 2023-03-02 MED ORDER — IPRATROPIUM-ALBUTEROL 0.5-2.5 (3) MG/3ML IN SOLN
3.0000 mL | Freq: Once | RESPIRATORY_TRACT | Status: AC
  Administered 2023-03-02: 3 mL via RESPIRATORY_TRACT
  Filled 2023-03-02: qty 3

## 2023-03-02 MED ORDER — FUROSEMIDE 10 MG/ML IJ SOLN
40.0000 mg | Freq: Every day | INTRAMUSCULAR | Status: DC
  Administered 2023-03-03 – 2023-03-04 (×2): 40 mg via INTRAVENOUS
  Filled 2023-03-02 (×2): qty 4

## 2023-03-02 MED ORDER — ONDANSETRON HCL 4 MG PO TABS
4.0000 mg | ORAL_TABLET | Freq: Four times a day (QID) | ORAL | Status: DC | PRN
  Administered 2023-03-05: 4 mg via ORAL
  Filled 2023-03-02: qty 1

## 2023-03-02 MED ORDER — POTASSIUM CHLORIDE CRYS ER 20 MEQ PO TBCR
40.0000 meq | EXTENDED_RELEASE_TABLET | Freq: Every evening | ORAL | Status: AC
  Administered 2023-03-02: 40 meq via ORAL
  Filled 2023-03-02: qty 2

## 2023-03-02 MED ORDER — HYDRALAZINE HCL 20 MG/ML IJ SOLN
10.0000 mg | Freq: Once | INTRAMUSCULAR | Status: AC
  Administered 2023-03-02: 10 mg via INTRAVENOUS
  Filled 2023-03-02: qty 1

## 2023-03-02 MED ORDER — PANTOPRAZOLE SODIUM 40 MG PO TBEC
40.0000 mg | DELAYED_RELEASE_TABLET | Freq: Two times a day (BID) | ORAL | Status: DC
  Administered 2023-03-02 – 2023-03-07 (×10): 40 mg via ORAL
  Filled 2023-03-02 (×10): qty 1

## 2023-03-02 MED ORDER — POTASSIUM CHLORIDE 10 MEQ/100ML IV SOLN
10.0000 meq | INTRAVENOUS | Status: DC
  Administered 2023-03-02 (×2): 10 meq via INTRAVENOUS
  Filled 2023-03-02 (×2): qty 100

## 2023-03-02 MED ORDER — HYDROCODONE-ACETAMINOPHEN 5-325 MG PO TABS
1.0000 | ORAL_TABLET | Freq: Once | ORAL | Status: AC
  Administered 2023-03-02: 1 via ORAL
  Filled 2023-03-02: qty 1

## 2023-03-02 MED ORDER — POTASSIUM CHLORIDE CRYS ER 20 MEQ PO TBCR
40.0000 meq | EXTENDED_RELEASE_TABLET | Freq: Once | ORAL | Status: AC
  Administered 2023-03-02: 40 meq via ORAL
  Filled 2023-03-02: qty 2

## 2023-03-02 NOTE — ED Notes (Signed)
ED TO INPATIENT HANDOFF REPORT  Name/Age/Gender Kara Bush 82 y.o. female  Code Status    Code Status Orders  (From admission, onward)           Start     Ordered   03/02/23 1402  Do not attempt resuscitation (DNR)  Continuous       Question Answer Comment  If patient has no pulse and is not breathing Do Not Attempt Resuscitation   If patient has a pulse and/or is breathing: Medical Treatment Goals LIMITED ADDITIONAL INTERVENTIONS: Use medication/IV fluids and cardiac monitoring as indicated; Do not use intubation or mechanical ventilation (DNI), also provide comfort medications.  Transfer to Progressive/Stepdown as indicated, avoid Intensive Care.   Consent: Discussion documented in EHR or advanced directives reviewed      03/02/23 1401           Code Status History     Date Active Date Inactive Code Status Order ID Comments User Context   02/05/2023 0056 02/14/2023 1758 DNR [id number]  Therisa Doyne, MD ED   11/23/2022 0956 11/28/2022 2333 DNR [id number]  Jonah Blue, MD ED   11/23/2022 0207 11/23/2022 0956 DNR [id number]  Angie Fava, DO ED   02/26/2021 1823 02/27/2021 2331 DNR [id number]  Teddy Spike, DO ED       Home/SNF/Other Home  Chief Complaint Acute on chronic diastolic congestive heart failure (HCC) [I50.33]  Level of Care/Admitting Diagnosis ED Disposition     ED Disposition  Admit   Condition  --   Comment  Hospital Area: [facility] [[id number]]  Level of Care: Progressive [102]  Admit to Progressive based on following criteria: CARDIOVASCULAR & THORACIC of moderate stability with acute coronary syndrome symptoms/low risk myocardial infarction/hypertensive urgency/arrhythmias/heart failure potentially compromising stability and stable post cardiovascular intervention patients.  May place patient in observation at [facility] or [facility] if equivalent level of care is available:: No  Covid Evaluation:  Asymptomatic - no recent exposure (last 10 days) testing not required  Diagnosis: Acute on chronic diastolic congestive heart failure [facility]) [[id number]]  Admitting Physician: Bobette Mo [[id number]]  Attending Physician: Bobette Mo [[id number]]          Medical History Past Medical History:  Diagnosis Date   Abdominal aortic aneurysm (HCC)    Anemia, improved 01/11/2013   Basal cell carcinoma    CAD (coronary artery disease)    hx CABG 1998, last cath 2007 with PCI and stenting to the proximal segment of the vein graft to the diagonal branch. DES Taxus was placed   Cervicalgia    Chronic fatigue fibromyalgia syndrome    Chronic pain syndrome    Depression    Fibromyalgia    History of nuclear stress test 08/07/2011   lexiscan; no evidence of ischemia or infarct; low risk    Hyperlipidemia    Hypertension    OSA (obstructive sleep apnea)    PAD (peripheral artery disease) (HCC)    Pes anserine bursitis    Pulmonary embolism (HCC) 01/11/2013   S/P resection of aortic aneurysm    Trochanteric bursitis    Ulcerative colitis (HCC)     Allergies Allergies  Allergen Reactions   Biotin Hives   Lisinopril Cough   Atenolol Other (See Comments)    colitis Other reaction(s): Bowel issues   Duloxetine Hcl Other (See Comments)    Mad pt feel spacey Other reaction(s): shaky; tired in the PMs   Lyrica [Pregabalin] Other (See Comments)  Made pt feel spacey   Penicillin G Rash    Other reaction(s): rash   Penicillins Rash   Sulfamethoxazole-Trimethoprim Nausea And Vomiting    Other reaction(s): elevated liver functions    IV Location/Drains/Wounds Patient Lines/Drains/Airways Status     Active Line/Drains/Airways     Name Placement date Placement time Site Days   Peripheral IV 03/02/23 20 G 1.25" Left;Posterior Hand 03/02/23  1045  Hand  less than 1            Labs/Imaging Results for orders placed or performed during the hospital encounter of 03/02/23  (from the past 48 hour(s))  Comprehensive metabolic panel     Status: Abnormal   Collection Time: 03/02/23 11:17 AM  Result Value Ref Range   Sodium 138 135 - 145 mmol/L   Potassium 2.6 (LL) 3.5 - 5.1 mmol/L    Comment: CRITICAL RESULT CALLED TO, READ BACK BY AND VERIFIED WITH Ramon Zanders, J RN @ 1200 ON 03/02/2023 BY ABDULHALIM,M    Chloride 96 (L) 98 - 111 mmol/L   CO2 30 22 - 32 mmol/L   Glucose, Bld 115 (H) 70 - 99 mg/dL    Comment: Glucose reference range applies only to samples taken after fasting for at least 8 hours.   BUN 16 8 - 23 mg/dL   Creatinine, Ser 5.63 (H) 0.44 - 1.00 mg/dL   Calcium 8.8 (L) 8.9 - 10.3 mg/dL   Total Protein 6.8 6.5 - 8.1 g/dL   Albumin 3.5 3.5 - 5.0 g/dL   AST 19 15 - 41 U/L   ALT 14 0 - 44 U/L   Alkaline Phosphatase 52 38 - 126 U/L   Total Bilirubin 0.6 0.3 - 1.2 mg/dL   GFR, Estimated 40 (L) >60 mL/min    Comment: (NOTE) Calculated using the CKD-EPI Creatinine Equation (2021)    Anion gap 12 5 - 15    Comment: Performed at [facility], [address] [place], [place] [address]  Brain natriuretic peptide     Status: Abnormal   Collection Time: 03/02/23 11:17 AM  Result Value Ref Range   B Natriuretic Peptide 671.2 (H) 0.0 - 100.0 pg/mL    Comment: Performed at [facility], [address], [place], [place] [address]  Troponin I (High Sensitivity)     Status: Abnormal   Collection Time: 03/02/23 11:17 AM  Result Value Ref Range   Troponin I (High Sensitivity) 24 (H) <18 ng/L    Comment: (NOTE) Elevated high sensitivity troponin I (hsTnI) values and significant  changes across serial measurements may suggest ACS but many other  chronic and acute conditions are known to elevate hsTnI results.  Refer to the "Links" section for chest pain algorithms and additional  guidance. Performed at [facility] [address] [place], [place] [address]   CBC with Differential     Status: Abnormal    Collection Time: 03/02/23 11:17 AM  Result Value Ref Range   WBC 8.4 4.0 - 10.5 K/uL   RBC 4.27 3.87 - 5.11 MIL/uL   Hemoglobin 9.0 (L) 12.0 - 15.0 g/dL   HCT 66.0 (L) 63.0 - 16.0 %   MCV 70.5 (L) 80.0 - 100.0 fL   MCH 21.1 (L) 26.0 - 34.0 pg   MCHC 29.9 (L) 30.0 - 36.0 g/dL   RDW 10.9 (H) 32.3 - 55.7 %   Platelets 213 150 - 400 K/uL   nRBC 0.0 0.0 - 0.2 %   Neutrophils Relative %  72 %   Neutro Abs 6.1 1.7 - 7.7 K/uL   Lymphocytes Relative 14 %   Lymphs Abs 1.2 0.7 - 4.0 K/uL   Monocytes Relative 10 %   Monocytes Absolute 0.9 0.1 - 1.0 K/uL   Eosinophils Relative 3 %   Eosinophils Absolute 0.3 0.0 - 0.5 K/uL   Basophils Relative 1 %   Basophils Absolute 0.0 0.0 - 0.1 K/uL   Immature Granulocytes 0 %   Abs Immature Granulocytes 0.02 0.00 - 0.07 K/uL    Comment: Performed at [facility], [address] [place], [place] [address]  Resp panel by RT-PCR (RSV, Flu A&B, Covid) Anterior Nasal Swab     Status: None   Collection Time: 03/02/23 11:17 AM   Specimen: Anterior Nasal Swab  Result Value Ref Range   SARS Coronavirus 2 by RT PCR NEGATIVE NEGATIVE    Comment: (NOTE) SARS-CoV-2 target nucleic acids are NOT DETECTED.  The SARS-CoV-2 RNA is generally detectable in upper respiratory specimens during the acute phase of infection. The lowest concentration of SARS-CoV-2 viral copies this assay can detect is 138 copies/mL. A negative result does not preclude SARS-Cov-2 infection and should not be used as the sole basis for treatment or other patient management decisions. A negative result may occur with  improper specimen collection/handling, submission of specimen other than nasopharyngeal swab, presence of viral mutation(s) within the areas targeted by this assay, and inadequate number of viral copies(<138 copies/mL). A negative result must be combined with clinical observations, patient history, and epidemiological information. The expected result is  Negative.  Fact Sheet for Patients:  BloggerCourse.com  Fact Sheet for Healthcare Providers:  SeriousBroker.it  This test is no t yet approved or cleared by the [place] FDA and  has been authorized for detection and/or diagnosis of SARS-CoV-2 by FDA under an Emergency Use Authorization (EUA). This EUA will remain  in effect (meaning this test can be used) for the duration of the COVID-19 declaration under Section 564(b)(1) of the Act, 21 U.S.C.section 360bbb-3(b)(1), unless the authorization is terminated  or revoked sooner.       Influenza A by PCR NEGATIVE NEGATIVE   Influenza B by PCR NEGATIVE NEGATIVE    Comment: (NOTE) The Xpert Xpress SARS-CoV-2/FLU/RSV plus assay is intended as an aid in the diagnosis of influenza from Nasopharyngeal swab specimens and should not be used as a sole basis for treatment. Nasal washings and aspirates are unacceptable for Xpert Xpress SARS-CoV-2/FLU/RSV testing.  Fact Sheet for Patients: BloggerCourse.com  Fact Sheet for Healthcare Providers: SeriousBroker.it  This test is not yet approved or cleared by the [place] FDA and has been authorized for detection and/or diagnosis of SARS-CoV-2 by FDA under an Emergency Use Authorization (EUA). This EUA will remain in effect (meaning this test can be used) for the duration of the COVID-19 declaration under Section 564(b)(1) of the Act, 21 U.S.C. section 360bbb-3(b)(1), unless the authorization is terminated or revoked.     Resp Syncytial Virus by PCR NEGATIVE NEGATIVE    Comment: (NOTE) Fact Sheet for Patients: BloggerCourse.com  Fact Sheet for Healthcare Providers: SeriousBroker.it  This test is not yet approved or cleared by the [place] FDA and has been authorized for detection and/or diagnosis of SARS-CoV-2 by FDA under an  Emergency Use Authorization (EUA). This EUA will remain in effect (meaning this test can be used) for the duration of the COVID-19 declaration under Section 564(b)(1) of the Act, 21 U.S.C. section 360bbb-3(b)(1), unless the authorization  is terminated or revoked.  Performed at [facility], [address], [place], [place] [address]   Blood gas, venous (at [facility] and [facility])     Status: Abnormal   Collection Time: 03/02/23 11:17 AM  Result Value Ref Range   pH, Ven 7.62 (HH) 7.25 - 7.43    Comment: CRITICAL RESULT CALLED TO, READ BACK BY AND VERIFIED WITH: RN D CRAWFORD AT 1130 03/02/23 CRUICKSHANK A    pCO2, Ven 39 (L) 44 - 60 mmHg   pO2, Ven 164 (H) 32 - 45 mmHg   Bicarbonate 40.1 (H) 20.0 - 28.0 mmol/L   Acid-Base Excess 17.2 (H) 0.0 - 2.0 mmol/L   O2 Saturation 99 %   Patient temperature 37.0     Comment: Performed at [facility], [address] [place], [place] [address]  Magnesium     Status: Abnormal   Collection Time: 03/02/23 11:17 AM  Result Value Ref Range   Magnesium 1.6 (L) 1.7 - 2.4 mg/dL    Comment: Performed at [facility], [address], [place], [place] [address]  Troponin I (High Sensitivity)     Status: Abnormal   Collection Time: 03/02/23  1:10 PM  Result Value Ref Range   Troponin I (High Sensitivity) 24 (H) <18 ng/L    Comment: (NOTE) Elevated high sensitivity troponin I (hsTnI) values and significant  changes across serial measurements may suggest ACS but many other  chronic and acute conditions are known to elevate hsTnI results.  Refer to the "Links" section for chest pain algorithms and additional  guidance. Performed at [facility], [address], [place], [place] [address]    DG Chest 2 View  Result Date: 03/02/2023 CLINICAL DATA:  sob EXAM: CHEST - 2 VIEW COMPARISON:  CXR 02/10/23 FINDINGS: No pleural effusion. No pneumothorax. Status post median sternotomy and CABG.  Unchanged cardiac and mediastinal contours. No focal airspace opacity. No radiographically apparent displaced rib fractures. Visualized upper abdomen is unremarkable. Vertebral body heights are maintained. IMPRESSION: No focal airspace opacity. Electronically Signed   By: Lorenza Cambridge M.D.   On: 03/02/2023 12:25    Pending Labs Unresulted Labs (From admission, onward)     Start     Ordered   03/03/23 0500  CBC  Tomorrow morning,   R        03/02/23 1401   03/03/23 0500  Comprehensive metabolic panel  Tomorrow morning,   R        03/02/23 1401            Vitals/Pain Today's Vitals   03/02/23 1300 03/02/23 1500 03/02/23 1600 03/02/23 1620  BP: (!) 204/82 (!) 194/91 (!) 186/89   Pulse: 89 73 82   Resp: 17 15 (!) 24   Temp:    97.9 F (36.6 C)  TempSrc:    Oral  SpO2: 99% 99% 99%   Weight:      Height:      PainSc:        Isolation Precautions No active isolations  Medications Medications  potassium chloride 10 mEq in 100 mL IVPB (10 mEq Intravenous Not Given 03/02/23 1421)  potassium chloride SA (KLOR-CON M) CR tablet 40 mEq (has no administration in time range)  furosemide (LASIX) injection 40 mg (has no administration in time range)  acetaminophen (TYLENOL) tablet 650 mg (has no administration in time range)    Or  acetaminophen (TYLENOL) suppository 650 mg (has no administration in time  range)  ondansetron (ZOFRAN) tablet 4 mg (has no administration in time range)    Or  ondansetron (ZOFRAN) injection 4 mg (has no administration in time range)  HYDROcodone-acetaminophen (NORCO/VICODIN) 5-325 MG per tablet 1-1.5 tablet (has no administration in time range)  citalopram (CELEXA) tablet 20 mg (has no administration in time range)  atorvastatin (LIPITOR) tablet 40 mg (has no administration in time range)  apixaban (ELIQUIS) tablet 2.5 mg (has no administration in time range)  hydrALAZINE (APRESOLINE) tablet 50 mg (has no administration in time range)  metoprolol  succinate (TOPROL-XL) 24 hr tablet 75 mg (has no administration in time range)  pantoprazole (PROTONIX) EC tablet 40 mg (has no administration in time range)  traZODone (DESYREL) tablet 50 mg (has no administration in time range)  hydrALAZINE (APRESOLINE) injection 10 mg (10 mg Intravenous Given 03/02/23 1206)  furosemide (LASIX) injection 60 mg (60 mg Intravenous Given 03/02/23 1206)  magnesium sulfate IVPB 2 g 50 mL (0 g Intravenous Stopped 03/02/23 1420)  potassium chloride SA (KLOR-CON M) CR tablet 40 mEq (40 mEq Oral Given 03/02/23 1429)  HYDROcodone-acetaminophen (NORCO/VICODIN) 5-325 MG per tablet 1 tablet (1 tablet Oral Given 03/02/23 1608)    Mobility walks with person assist

## 2023-03-02 NOTE — Progress Notes (Signed)
   03/02/23 2355  BiPAP/CPAP/SIPAP  BiPAP/CPAP/SIPAP Pt Type Adult  BiPAP/CPAP/SIPAP DREAMSTATIOND  Mask Type Full face mask  Mask Size Medium  Flow Rate 3 lpm  Patient Home Equipment No  Auto Titrate Yes (5-20)  Nasal massage performed No (comment)

## 2023-03-02 NOTE — ED Triage Notes (Signed)
Pt arrives from home via [facility] for c/o SOB and CHF with EMS report of SOB starting this morning. Pt recently admitted and Dced x2 weeks ago for possible TIA. Pt is on baseline 4L [place], has hx of afib and takes eliquis. 20g Lt hand via EMS.

## 2023-03-02 NOTE — H&P (Signed)
History and Physical    Patient: Kara Bush NWG:[id number] DOB: Mar 11, 1941 DOA: 03/02/2023 DOS: the patient was seen and examined on 03/02/2023 PCP: Jackelyn Poling, DO  Patient coming from: Home  Chief Complaint:  Chief Complaint  Patient presents with   Shortness of Breath   HPI: Kara Bush is a 82 y.o. female with medical history significant of AAA, AAA resection, microcytic anemia, basal cell carcinoma, CAD, CABG, cervicalgia, chronic fatigue fibromyalgia syndrome, chronic pain syndrome, depression, hyperlipidemia, hypertension, obstructive sleep apnea, peripheral vascular disease, Pes Anserine, trochanteric bursitis bursitis, history of pulmonary embolism on apixaban, ulcerative colitis who presented to the emergency department complaints of dyspnea, lower extremity edema, orthopnea, who woke up early last night with dyspnea and has been using her as needed home oxygen at 4 LPM home.  She does not use oxygen at baseline.  No chest pain.  She gets occasional palpitations.  The patient last week a whole meat pizza, she also has been eating bacon and has been drinking a lot of fluids.  She denied fever, chills, rhinorrhea, sore throat, wheezing or hemoptysis. No abdominal pain, nausea, emesis, diarrhea, constipation, melena or hematochezia.  No flank pain, dysuria, frequency or hematuria.  No polyuria, polydipsia, polyphagia or blurred vision.   Lab work: CBC showed a white count of 8.4, hemoglobin 9.0 g/dL with an MCV of 57.8 fL and platelets 213.  Coronavirus, influenza and RSV PCR was negative.  Venous blood gas with a pH of 7.62, pCO2 of 39 and pO2 of 164 mmHg.  Bicarbonate was 40.1 and acid-base excess was 17.2 mmol/L.  First troponin 24 ng/L and BNP 671.2 pg/mL.  CMP showed a sodium 138, potassium of 2.6, chloride 96 and CO2 30 mmol/L.  Glucose on 115, BUN 16 and creatinine 1.33 mg/dL.  LFTs and calcium were normal after correction to albumin level.  Magnesium was 1.6  mg/dL.  Imaging: No pleural effusion or pneumothorax.  Previous CABG.  Unchanged cardiomediastinal contours.  No focal airspace opacity.   ED course: Initial vital signs were temperature 97.9 F, pulse 85, respiration 15, BP 197/109 mmHg and O2 sat 98% on room air.  The patient received hydralazine 10 mg IVP, furosemide 60 mg IVP, magnesium sulfate 2 g IVPB, KCl 10 mEq IVPB x 3 and I added KCl 40 mEq p.o. x 1.  Review of Systems: As mentioned in the history of present illness. All other systems reviewed and are negative. Past Medical History:  Diagnosis Date   Abdominal aortic aneurysm (HCC)    Anemia, improved 01/11/2013   Basal cell carcinoma    CAD (coronary artery disease)    hx CABG 1998, last cath 2007 with PCI and stenting to the proximal segment of the vein graft to the diagonal branch. DES Taxus was placed   Cervicalgia    Chronic fatigue fibromyalgia syndrome    Chronic pain syndrome    Depression    Fibromyalgia    History of nuclear stress test 08/07/2011   lexiscan; no evidence of ischemia or infarct; low risk    Hyperlipidemia    Hypertension    OSA (obstructive sleep apnea)    PAD (peripheral artery disease) (HCC)    Pes anserine bursitis    Pulmonary embolism (HCC) 01/11/2013   S/P resection of aortic aneurysm    Trochanteric bursitis    Ulcerative colitis [facility])    Past Surgical History:  Procedure Laterality Date   AORTOBIEXTERNAL ILIAC BYPASS  1991   BIOPSY  11/25/2022  Procedure: BIOPSY;  Surgeon: Jeani Hawking, MD;  Location: [facility] ENDOSCOPY;  Service: Gastroenterology;;   CATARACT EXTRACTION Left 09/2019   CHOLECYSTECTOMY  1997   COLONOSCOPY WITH PROPOFOL N/A 11/25/2022   Procedure: COLONOSCOPY WITH PROPOFOL;  Surgeon: Jeani Hawking, MD;  Location: [facility] ENDOSCOPY;  Service: Gastroenterology;  Laterality: N/A;   CORONARY ANGIOPLASTY WITH STENT PLACEMENT  2006   to LAD, ATRETIC LIMA AT THAT TIME ALSO   CORONARY ANGIOPLASTY WITH STENT PLACEMENT  2007   STENT TO  PROX VG-DIAG, OTHER STENTS PATENT   CORONARY ARTERY BYPASS GRAFT  1998   LIMA-LAD;VG-DIAG & RCA   CORONARY STENT PLACEMENT  2005   TO LAD & LCX-STENTS,   ESOPHAGOGASTRODUODENOSCOPY (EGD) WITH PROPOFOL N/A 11/25/2022   Procedure: ESOPHAGOGASTRODUODENOSCOPY (EGD) WITH PROPOFOL;  Surgeon: Jeani Hawking, MD;  Location: [facility] ENDOSCOPY;  Service: Gastroenterology;  Laterality: N/A;   HERNIA REPAIR     TUBAL LIGATION     BTL   Social History:  reports that she quit smoking about 32 years ago. Her smoking use included cigarettes. She started smoking about 52 years ago. She has a 50 pack-year smoking history. She has never been exposed to tobacco smoke. She has never used smokeless tobacco. She reports that she does not drink alcohol and does not use drugs.  Allergies  Allergen Reactions   Biotin Hives   Lisinopril Cough   Atenolol Other (See Comments)    colitis Other reaction(s): Bowel issues   Duloxetine Hcl Other (See Comments)    Mad pt feel spacey Other reaction(s): shaky; tired in the PMs   Lyrica [Pregabalin] Other (See Comments)    Made pt feel spacey   Penicillin G Rash    Other reaction(s): rash   Penicillins Rash   Sulfamethoxazole-Trimethoprim Nausea And Vomiting    Other reaction(s): elevated liver functions    Family History  Problem Relation Age of Onset   Heart disease Father    Heart attack Father        @ 50   Cancer Paternal Aunt        BREAST   Heart failure Mother        at 93   Healthy Sister    Cancer Brother        pancreatic    Heart failure Maternal Grandmother    Heart attack Maternal Grandfather    Heart attack Paternal Grandmother        at 35   Healthy Daughter    Healthy Daughter     Prior to Admission medications   Medication Sig Start Date End Date Taking? Authorizing Provider  apixaban (ELIQUIS) 2.5 MG TABS tablet Take 1 tablet (2.5 mg total) by mouth 2 (two) times daily. 11/28/22   Willeen Niece, MD  atorvastatin (LIPITOR) 40 MG tablet  Take 1 tablet (40 mg total) by mouth daily. 02/15/23   Regalado, Belkys A, MD  b complex vitamins capsule Take 1 capsule by mouth daily.    [provider]  citalopram (CELEXA) 20 MG tablet Take 1 tablet (20 mg total) by mouth daily. 02/14/23 03/16/23  Regalado, Belkys A, MD  furosemide (LASIX) 40 MG tablet Take 1 tablet (40 mg total) by mouth daily as needed. 02/18/23 05/19/23  Hilty, Lisette Abu, MD  hydrALAZINE (APRESOLINE) 50 MG tablet Take 1 tablet (50 mg total) by mouth 3 (three) times daily. 02/24/23 05/25/23  Carlos Levering, NP  HYDROcodone-acetaminophen (NORCO/VICODIN) 5-325 MG tablet Take 1 tablet by mouth 4 (four) times daily as needed for moderate  pain. May Take an extra 0.5 to one tablet when pain is severe. 02/25/23   Jones Bales, NP  isosorbide mononitrate (IMDUR) 30 MG 24 hr tablet Take 0.5 tablets (15 mg total) by mouth daily. 12/26/22 03/26/23  Joylene Grapes, NP  metoprolol succinate (TOPROL-XL) 25 MG 24 hr tablet Take 3 tablets (75 mg total) by mouth daily. Take with or immediately following a meal. 02/15/23   Regalado, Belkys A, MD  Multiple Vitamin (MULITIVITAMIN WITH MINERALS) TABS Take 1 tablet by mouth daily.    [provider]  ondansetron (ZOFRAN-ODT) 4 MG disintegrating tablet Take 1 tablet (4 mg total) by mouth every 8 (eight) hours. 02/14/23   Regalado, Belkys A, MD  pantoprazole (PROTONIX) 40 MG tablet Take 1 tablet (40 mg total) by mouth 2 (two) times daily. 02/14/23   Regalado, Belkys A, MD  potassium chloride (KLOR-CON) 10 MEQ tablet Take 1 tablet (10 mEq total) by mouth daily as needed. Take with furosemide 02/18/23 05/19/23  Hilty, Lisette Abu, MD  traZODone (DESYREL) 50 MG tablet Take 1 tablet (50 mg total) by mouth at bedtime as needed for sleep. 02/14/23 03/16/23  Alba Cory, MD    Physical Exam: Vitals:   03/02/23 1104 03/02/23 1224 03/02/23 1240 03/02/23 1300  BP: (!) 197/109  (!) 176/71 (!) 204/82  Pulse: 85 88 86 89  Resp: 16 16 16 17    Temp: 97.9 F (36.6 C)  97.9 F (36.6 C)   TempSrc: Oral  Oral   SpO2: 98%  98% 99%  Weight:      Height:       Physical Exam Vitals and nursing note reviewed.  Constitutional:      General: She is awake. She is not in acute distress.    Appearance: She is well-developed.     Interventions: Nasal cannula in place.  HENT:     Head: Normocephalic.     Nose: No rhinorrhea.     Mouth/Throat:     Pharynx: No oropharyngeal exudate.  Eyes:     General: No scleral icterus.    Pupils: Pupils are equal, round, and reactive to light.  Neck:     Vascular: No JVD.  Cardiovascular:     Rate and Rhythm: Normal rate.     Heart sounds: S1 normal and S2 normal.  Pulmonary:     Breath sounds: Examination of the right-lower field reveals decreased breath sounds and rales. Examination of the left-lower field reveals decreased breath sounds and rales. Decreased breath sounds and rales present. No wheezing or rhonchi.  Abdominal:     General: Bowel sounds are normal. There is no distension.     Palpations: Abdomen is soft.     Tenderness: There is no abdominal tenderness. There is no guarding.  Musculoskeletal:     Cervical back: Neck supple.     Right lower leg: Edema present.     Left lower leg: Edema present.  Skin:    General: Skin is warm and dry.  Neurological:     General: No focal deficit present.     Mental Status: She is alert and oriented to person, place, and time.  Psychiatric:        Mood and Affect: Mood normal.        Behavior: Behavior normal. Behavior is cooperative.     Data Reviewed:  Results are pending, will review when available.  02/05/2023 transthoracic echocardiogram IMPRESSIONS:   1. Left ventricular ejection fraction, by estimation, is 55 to  60%. The left ventricle has normal function. The left ventricle has no regional wall motion abnormalities. Left ventricular diastolic parameters are indeterminate.  2. Right ventricular systolic function is  normal. The right ventricular size is normal. There is normal pulmonary artery systolic pressure.  3. Left atrial size was mildly dilated.  4. The mitral valve is degenerative. Mild mitral valve regurgitation. No evidence of mitral stenosis.  5. Partial fusion of right and left cusps. The aortic valve is tricuspid. There is moderate calcification of the aortic valve. There is moderate thickening of the aortic valve. Aortic valve regurgitation is mild. Moderate aortic valve stenosis.  6. The inferior vena cava is normal in size with greater than 50% respiratory variability, suggesting right atrial pressure of 3 mmHg.  EKG: Vent. rate 81 BPM PR interval 176 ms QRS duration 82 ms QT/QTcB 406/472 ms P-R-T axes 31 35 82 Sinus arrhythmia Consider left ventricular hypertrophy  Assessment and Plan: Principal Problem:   Acute on chronic diastolic congestive heart failure (HCC) Observation/telemetry. Supplemental oxygen as needed. Sodium and fluid restriction. Continue furosemide 40 mg IVP twice daily. Continue hydralazine 50 mg p.o. 3 times daily. Continue metoprolol 75 mg p.o. daily. Monitor daily weights, intake and output. Monitor renal function electrolytes. Echocardiogram done recently.  Active Problems:   Elevated troponin Minimal. Secondary to demand ischemia.    Hypokalemia Replacing. Follow level in AM.    Hypomagnesemia Magnesium sulfate 2 g IVPB x 1.    Hyperlipidemia Continue atorvastatin 40 mg p.o. daily.    Depression Continue escitalopram 20 mg p.o. daily. Continue trazodone 50 mg p.o. bedtime.    CAD (coronary artery disease),  CABG 1998, STENTS MULTIPLE SITES SINCE.  Holding apixaban, atorvastatin and metoprolol.    Obstructive sleep apnea Has not been using that at home recently due to device dysfunction. She will try to use 1 of our CPAP devices while in the hospital.    Paroxysmal atrial fibrillation (HCC) CHA2DS2-VASc Score of at least  6. Continue apixaban 2.5 mg p.o. twice daily. Continue metoprolol as above.    CKD (chronic kidney disease) stage 3, GFR 30-59 ml/min (HCC) Monitor renal function electrolytes.     Advance Care Planning:   Code Status: DNR   Consults:   Family Communication: Her daughter was at bedside.  Severity of Illness: The appropriate patient status for this patient is OBSERVATION. Observation status is judged to be reasonable and necessary in order to provide the required intensity of service to ensure the patient's safety. The patient's presenting symptoms, physical exam findings, and initial radiographic and laboratory data in the context of their medical condition is felt to place them at decreased risk for further clinical deterioration. Furthermore, it is anticipated that the patient will be medically stable for discharge from the hospital within 2 midnights of admission.   Author: Bobette Mo, MD 03/02/2023 1:11 PM  For on call review [email or web address].   This document was prepared using Dragon voice recognition software and may contain some unintended transcription errors.

## 2023-03-02 NOTE — ED Provider Notes (Signed)
[place] EMERGENCY DEPARTMENT AT [facility] Provider Note   CSN: [id number] Arrival date & time: 03/02/23  1053     History  Chief Complaint  Patient presents with   Shortness of Breath    Kara Bush is a 82 y.o. female.  With past medical history of coronary artery disease s/p CABG, pulmonary embolism on Eliquis, combined systolic and diastolic heart failure, PAD, obstructive sleep apnea, chronic pain, atrial fibrillation who presents to the emergency department with shortness of breath.  States symptoms began this morning. She describes getting up around 2-3 AM. She has been having trouble sleeping and noticed she felt mildly dyspneic. She then got up at 6AM to have coffee and continued to feel worse. She had been using her CPAP but switched to nasal cannula oxygen and found herself walking over to the O2 multiple times this morning needing to rest due to her dyspnea. She has had associated lightheadedness. No chest pain, palpitations, cough or fever. She denies recent illnesses. She notes she is to be taking Lasix and says she took it Friday and Saturday but was told not to take it on Sunday. States the swelling in her legs had gone down some but not all the way. She notes her usual weight between 190-194.   On chart review, it appears that she saw her cardiologist on 02/24/2023 with stable findings.  They did increase her hydralazine dosing due to her hypertension. Was admitted on 02/04/23-02/14/23 for acute blood loss anemia requiring transfusion, AKI, stroke.   HPI     Home Medications Prior to Admission medications   Medication Sig Start Date End Date Taking? Authorizing Provider  apixaban (ELIQUIS) 2.5 MG TABS tablet Take 1 tablet (2.5 mg total) by mouth 2 (two) times daily. 11/28/22   Willeen Niece, MD  atorvastatin (LIPITOR) 40 MG tablet Take 1 tablet (40 mg total) by mouth daily. 02/15/23   Regalado, Belkys A, MD  b complex vitamins capsule Take 1 capsule by  mouth daily.    [provider]  citalopram (CELEXA) 20 MG tablet Take 1 tablet (20 mg total) by mouth daily. 02/14/23 03/16/23  Regalado, Belkys A, MD  furosemide (LASIX) 40 MG tablet Take 1 tablet (40 mg total) by mouth daily as needed. 02/18/23 05/19/23  Hilty, Lisette Abu, MD  hydrALAZINE (APRESOLINE) 50 MG tablet Take 1 tablet (50 mg total) by mouth 3 (three) times daily. 02/24/23 05/25/23  Carlos Levering, NP  HYDROcodone-acetaminophen (NORCO/VICODIN) 5-325 MG tablet Take 1 tablet by mouth 4 (four) times daily as needed for moderate pain. May Take an extra 0.5 to one tablet when pain is severe. 02/25/23   Jones Bales, NP  isosorbide mononitrate (IMDUR) 30 MG 24 hr tablet Take 0.5 tablets (15 mg total) by mouth daily. 12/26/22 03/26/23  Joylene Grapes, NP  metoprolol succinate (TOPROL-XL) 25 MG 24 hr tablet Take 3 tablets (75 mg total) by mouth daily. Take with or immediately following a meal. 02/15/23   Regalado, Belkys A, MD  Multiple Vitamin (MULITIVITAMIN WITH MINERALS) TABS Take 1 tablet by mouth daily.    [provider]  ondansetron (ZOFRAN-ODT) 4 MG disintegrating tablet Take 1 tablet (4 mg total) by mouth every 8 (eight) hours. 02/14/23   Regalado, Belkys A, MD  pantoprazole (PROTONIX) 40 MG tablet Take 1 tablet (40 mg total) by mouth 2 (two) times daily. 02/14/23   Regalado, Belkys A, MD  potassium chloride (KLOR-CON) 10 MEQ tablet Take 1 tablet (10 mEq total)  by mouth daily as needed. Take with furosemide 02/18/23 05/19/23  Hilty, Lisette Abu, MD  traZODone (DESYREL) 50 MG tablet Take 1 tablet (50 mg total) by mouth at bedtime as needed for sleep. 02/14/23 03/16/23  Regalado, Jon Billings A, MD      Allergies    Biotin, Lisinopril, Atenolol, Duloxetine hcl, Lyrica [pregabalin], Penicillin g, Penicillins, and Sulfamethoxazole-trimethoprim    Review of Systems   Review of Systems  Respiratory:  Positive for shortness of breath.   Neurological:  Positive for light-headedness.   All other systems reviewed and are negative.   Physical Exam Updated Vital Signs BP (!) 204/82   Pulse 89   Temp 97.9 F (36.6 C) (Oral)   Resp 17   Ht 5\' 3"  (1.6 m)   Wt 86.2 kg   SpO2 99%   BMI 33.66 kg/m  Physical Exam Vitals and nursing note reviewed.  Constitutional:      Appearance: Normal appearance. She is obese. She is ill-appearing.  HENT:     Head: Normocephalic.     Mouth/Throat:     Pharynx: Oropharynx is clear.  Eyes:     General: No scleral icterus. Neck:     Vascular: No JVD.  Cardiovascular:     Rate and Rhythm: Normal rate and regular rhythm.     Pulses: Normal pulses.     Heart sounds: Murmur heard.  Pulmonary:     Effort: Tachypnea present.     Breath sounds: Examination of the right-lower field reveals rales. Examination of the left-lower field reveals rales. Rales present.  Chest:     Chest wall: No tenderness.  Abdominal:     General: Bowel sounds are normal.     Palpations: Abdomen is soft.  Musculoskeletal:     Right lower leg: Tenderness present. Edema present.     Left lower leg: Tenderness present. Edema present.  Skin:    General: Skin is warm and dry.     Capillary Refill: Capillary refill takes less than 2 seconds.  Neurological:     General: No focal deficit present.     Mental Status: She is alert and oriented to person, place, and time.  Psychiatric:        Mood and Affect: Mood normal.        Behavior: Behavior normal.     ED Results / Procedures / Treatments   Labs (all labs ordered are listed, but only abnormal results are displayed) Labs Reviewed  COMPREHENSIVE METABOLIC PANEL - Abnormal; Notable for the following components:      Result Value   Potassium 2.6 (*)    Chloride 96 (*)    Glucose, Bld 115 (*)    Creatinine, Ser 1.33 (*)    Calcium 8.8 (*)    GFR, Estimated 40 (*)    All other components within normal limits  BRAIN NATRIURETIC PEPTIDE - Abnormal; Notable for the following components:   B  Natriuretic Peptide 671.2 (*)    All other components within normal limits  CBC WITH DIFFERENTIAL/PLATELET - Abnormal; Notable for the following components:   Hemoglobin 9.0 (*)    HCT 30.1 (*)    MCV 70.5 (*)    MCH 21.1 (*)    MCHC 29.9 (*)    RDW 17.9 (*)    All other components within normal limits  BLOOD GAS, VENOUS - Abnormal; Notable for the following components:   pH, Ven 7.62 (*)    pCO2, Ven 39 (*)    pO2, Ven 164 (*)  Bicarbonate 40.1 (*)    Acid-Base Excess 17.2 (*)    All other components within normal limits  MAGNESIUM - Abnormal; Notable for the following components:   Magnesium 1.6 (*)    All other components within normal limits  TROPONIN I (HIGH SENSITIVITY) - Abnormal; Notable for the following components:   Troponin I (High Sensitivity) 24 (*)    All other components within normal limits  RESP PANEL BY RT-PCR (RSV, FLU A&B, COVID)  RVPGX2  TROPONIN I (HIGH SENSITIVITY)    EKG EKG Interpretation Date/Time:  Monday March 02 2023 11:10:38 EDT Ventricular Rate:  81 PR Interval:  176 QRS Duration:  82 QT Interval:  406 QTC Calculation: 472 R Axis:   35  Text Interpretation: Sinus arrhythmia Consider left ventricular hypertrophy  Repolarization abnormalities lateral leads, more significant than prior tracings Confirmed by Alvester Chou [phone]) on 03/02/2023 11:55:16 AM  Radiology DG Chest 2 View  Result Date: 03/02/2023 CLINICAL DATA:  sob EXAM: CHEST - 2 VIEW COMPARISON:  CXR 02/10/23 FINDINGS: No pleural effusion. No pneumothorax. Status post median sternotomy and CABG. Unchanged cardiac and mediastinal contours. No focal airspace opacity. No radiographically apparent displaced rib fractures. Visualized upper abdomen is unremarkable. Vertebral body heights are maintained. IMPRESSION: No focal airspace opacity. Electronically Signed   By: Lorenza Cambridge M.D.   On: 03/02/2023 12:25    Procedures Procedures   Medications Ordered in ED Medications   potassium chloride 10 mEq in 100 mL IVPB (10 mEq Intravenous New Bag/Given 03/02/23 1331)  magnesium sulfate IVPB 2 g 50 mL (2 g Intravenous New Bag/Given 03/02/23 1305)  hydrALAZINE (APRESOLINE) injection 10 mg (10 mg Intravenous Given 03/02/23 1206)  furosemide (LASIX) injection 60 mg (60 mg Intravenous Given 03/02/23 1206)    ED Course/ Medical Decision Making/ A&P Clinical Course as of 03/02/23 1331  Mon Mar 02, 2023  1155 Ecg ST depressions in lateral leads seen on earlier 2024 tracings [MT]  1249 This is an 82 year old female with a history of coronary disease, congestive heart failure, A-fib on Eliquis, on home oxygen "as needed" presented to ED with worsening dyspnea beginning last night, reporting bilateral symmetrical leg swelling, labored breathing.  On exam the patient is afebrile, heart rate is in A-fib but rate controlled, blood pressure is elevated, she does have some labored breathing, satting 90% on 4 L nasal cannula.  She has fine crackles of her mid and lower lung fields, symmetrical pitting edema.  Labs are notable for BNP of 671, hemoglobin of 9.0, magnesium of 1.6, and hypokalemia with potassium 2.6.  Her EKG shows atrial fibrillation is rate controlled with persistent ST depressions in lateral leads, which per my review are similar to earlier EKG is performed this year.  Troponin is 24.  COVID is negative. [MT]  1250 Suspect this is likely congestive heart failure related.  Patient was given IV diuresis along with IV potassium magnesium, and given her work of breathing and her oxygen requirement I would anticipate medical admission.  I do not see clear evidence of sepsis or pneumonia and there is no indication for antibiotics at this time. [MT]    Clinical Course User Index [MT] Trifan, Kermit Balo, MD    Medical Decision Making Amount and/or Complexity of Data Reviewed Labs: ordered. Radiology: ordered.  Risk Prescription drug management. Decision regarding  hospitalization.  Initial Impression and Ddx 82 year old female who presents to the emergency department with shortness of breath Patient PMH that increases complexity of ED encounter: CHF, CAD,  PE, atrial fibrillation, obstructive sleep apnea Differential: ACS, PE, pneumothorax, pleural effusion, cardiac tamponade, aortic dissection, COPD exacerbation, pneumonia, asthma exacerbation, congestive heart failure, viral upper respiratory infection, medication side effect, anaphylaxis, etc.    Interpretation of Diagnostics I independent reviewed and interpreted the labs as followed: BNP 671, initial troponin 24, mag 1.6, potassium of 2.6, hemoglobin 9, stable, respiratory alkalosis on VBG  - I independently visualized the following imaging with scope of interpretation limited to determining acute life threatening conditions related to emergency care: Chest x-ray, which revealed without acute findings  Patient Reassessment and Ultimate Disposition/Management 82 year old female who presents to the emergency department with shortness of breath.  On my initial exam she is tachypneic, moderate work of breathing.  She is on 4 L nasal cannula saturating well.  She has bilateral lower lobe Rales as well as bilateral lower extremity pitting edema.  Appears to be fluid volume overloaded on exam.  I do not appreciate any JVD. Do not feel that her work of breathing elicits use of BiPAP at this time.  She is able to talk in full sentences.  Will obtain workup including BNP, troponin, VBG, chest x-ray.  Chest x-ray without acute findings.  Clinically has rales bilaterally.  She is on O2.  Will give her a DuoNeb.  Additionally ordering 60 of Lasix IV. Labs with hypokalemia, likely from her diuretics at home.  Ordering IV potassium and will order a magnesium lab.  Mag is 1.6, given 2 g of mag IV.  Also giving a dose of IV hydralazine given her blood pressure.  Initial troponin is 24, significantly down from previous  but will trend.  RVP is negative.  BNP is 671 which is about stable for her.  On my reassessment she continues to be dyspneic.  She is on 4 L nasal cannula tolerating this well.  I do think that this is likely a CHF exacerbation.  She has no history of COPD to contribute to this.  No evidence of pneumothorax, pleural effusion or pneumonia on her workup.  No viral upper respiratory symptoms.  Feel that her troponin anemia could be demand if it continues to trend up a little bit higher.  She has some chronic lateral ST depressions and do not feel that she is having active ACS at this time.  Her BNP is 671 which is not significantly off from her baseline.  Although her labs appear stable, clinically she is working to breathe and clinically fluid volume overloaded.  Feel that she is likely in CHF exacerbation.  She was given 60 mg of IV Lasix.  Consulted and spoke with Dr. Robb Matar, hospitalist who agrees to admit the patient.  The patient has been updated on plan of care and is agreeable to admission at this time.  Patient management required discussion with the following services or consulting groups:  Hospitalist Service  Complexity of Problems Addressed Chronic illness with exacerbation  Additional Data Reviewed and Analyzed Further history obtained from: EMS on arrival, Past medical history and medications listed in the EMR, Prior ED visit notes, and Care Everywhere  Patient Encounter Risk Assessment Consideration of hospitalization  Final Clinical Impression(s) / ED Diagnoses Final diagnoses:  Shortness of breath    Rx / DC Orders ED Discharge Orders     None         Cristopher Peru, PA-C 03/02/23 1331    Terald Sleeper, MD 03/02/23 1350

## 2023-03-02 NOTE — Telephone Encounter (Signed)
Patient is calling to report her weight logs from the weekend.  Saturday 192 Sunday 187 (Pt stated they were unsure if that's correct) This morning 190.2  Patient stated their SOB is very bad this morning. Patient stated that they can hardly function without their oxygen, but she is not having any chest pain. Patient mentioned she was unsure if she should go to the hospital. Please advise.

## 2023-03-02 NOTE — Telephone Encounter (Signed)
Pt says she is calling ER response to take her to [facility]

## 2023-03-03 DIAGNOSIS — F32A Depression, unspecified: Secondary | ICD-10-CM | POA: Diagnosis not present

## 2023-03-03 DIAGNOSIS — Z79899 Other long term (current) drug therapy: Secondary | ICD-10-CM | POA: Diagnosis not present

## 2023-03-03 DIAGNOSIS — E785 Hyperlipidemia, unspecified: Secondary | ICD-10-CM | POA: Diagnosis not present

## 2023-03-03 DIAGNOSIS — I350 Nonrheumatic aortic (valve) stenosis: Secondary | ICD-10-CM | POA: Diagnosis not present

## 2023-03-03 DIAGNOSIS — I2510 Atherosclerotic heart disease of native coronary artery without angina pectoris: Secondary | ICD-10-CM

## 2023-03-03 DIAGNOSIS — I5043 Acute on chronic combined systolic (congestive) and diastolic (congestive) heart failure: Secondary | ICD-10-CM | POA: Diagnosis not present

## 2023-03-03 DIAGNOSIS — E873 Alkalosis: Secondary | ICD-10-CM | POA: Diagnosis not present

## 2023-03-03 DIAGNOSIS — I130 Hypertensive heart and chronic kidney disease with heart failure and stage 1 through stage 4 chronic kidney disease, or unspecified chronic kidney disease: Secondary | ICD-10-CM | POA: Diagnosis not present

## 2023-03-03 DIAGNOSIS — I160 Hypertensive urgency: Secondary | ICD-10-CM

## 2023-03-03 DIAGNOSIS — R0602 Shortness of breath: Secondary | ICD-10-CM | POA: Diagnosis present

## 2023-03-03 DIAGNOSIS — E876 Hypokalemia: Secondary | ICD-10-CM | POA: Diagnosis not present

## 2023-03-03 DIAGNOSIS — I480 Paroxysmal atrial fibrillation: Secondary | ICD-10-CM | POA: Diagnosis not present

## 2023-03-03 DIAGNOSIS — I7121 Aneurysm of the ascending aorta, without rupture: Secondary | ICD-10-CM | POA: Diagnosis not present

## 2023-03-03 DIAGNOSIS — I5023 Acute on chronic systolic (congestive) heart failure: Secondary | ICD-10-CM | POA: Diagnosis present

## 2023-03-03 DIAGNOSIS — I5033 Acute on chronic diastolic (congestive) heart failure: Secondary | ICD-10-CM | POA: Diagnosis not present

## 2023-03-03 DIAGNOSIS — Z66 Do not resuscitate: Secondary | ICD-10-CM | POA: Diagnosis not present

## 2023-03-03 DIAGNOSIS — Z1152 Encounter for screening for COVID-19: Secondary | ICD-10-CM | POA: Diagnosis not present

## 2023-03-03 DIAGNOSIS — Z8249 Family history of ischemic heart disease and other diseases of the circulatory system: Secondary | ICD-10-CM | POA: Diagnosis not present

## 2023-03-03 DIAGNOSIS — E669 Obesity, unspecified: Secondary | ICD-10-CM | POA: Diagnosis not present

## 2023-03-03 DIAGNOSIS — G894 Chronic pain syndrome: Secondary | ICD-10-CM | POA: Diagnosis not present

## 2023-03-03 DIAGNOSIS — D509 Iron deficiency anemia, unspecified: Secondary | ICD-10-CM | POA: Diagnosis not present

## 2023-03-03 DIAGNOSIS — M797 Fibromyalgia: Secondary | ICD-10-CM | POA: Diagnosis not present

## 2023-03-03 DIAGNOSIS — Z7901 Long term (current) use of anticoagulants: Secondary | ICD-10-CM | POA: Diagnosis not present

## 2023-03-03 DIAGNOSIS — I739 Peripheral vascular disease, unspecified: Secondary | ICD-10-CM | POA: Diagnosis not present

## 2023-03-03 DIAGNOSIS — N1832 Chronic kidney disease, stage 3b: Secondary | ICD-10-CM | POA: Diagnosis not present

## 2023-03-03 DIAGNOSIS — G4733 Obstructive sleep apnea (adult) (pediatric): Secondary | ICD-10-CM | POA: Diagnosis not present

## 2023-03-03 LAB — GLUCOSE, CAPILLARY: Glucose-Capillary: 159 mg/dL — ABNORMAL HIGH (ref 70–99)

## 2023-03-03 MED ORDER — POTASSIUM CHLORIDE CRYS ER 20 MEQ PO TBCR
40.0000 meq | EXTENDED_RELEASE_TABLET | Freq: Once | ORAL | Status: AC
  Administered 2023-03-03: 40 meq via ORAL
  Filled 2023-03-03: qty 2

## 2023-03-03 MED ORDER — MAGNESIUM SULFATE 4 GM/100ML IV SOLN
4.0000 g | Freq: Once | INTRAVENOUS | Status: AC
  Administered 2023-03-03: 4 g via INTRAVENOUS
  Filled 2023-03-03: qty 100

## 2023-03-03 MED ORDER — POTASSIUM CHLORIDE 20 MEQ PO PACK
60.0000 meq | PACK | Freq: Once | ORAL | Status: DC
  Filled 2023-03-03: qty 3

## 2023-03-03 MED ORDER — APIXABAN 5 MG PO TABS
5.0000 mg | ORAL_TABLET | Freq: Two times a day (BID) | ORAL | Status: DC
  Administered 2023-03-03 – 2023-03-07 (×8): 5 mg via ORAL
  Filled 2023-03-03 (×8): qty 1

## 2023-03-03 NOTE — Consult Note (Signed)
Cardiology Consultation   Patient ID: Kara Bush MRN: [id number]; DOB: 1941/05/10  Admit date: 03/02/2023 Date of Consult: 03/03/2023  PCP:  Jackelyn Poling, DO   [facility] Providers Cardiologist:  Chrystie Nose, MD  Electrophysiologist:  Lanier Prude, MD  {  Patient Profile:   Kara Bush is a 82 y.o. 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 Pola Corn.  History of Present Illness:   [name] was hospitalized 11/2022 for GI bleed found to have mild ulcerative colitis. Eliquis was initially held and resumed after EGD with single nonbleeding colon lesion. Cardiology consulted for elevated troponin felt related to demand ischemia. Echo showed normal BiV function with no RWMA, mild LVH, midl MR, moderate to severe TR. She was referred to Dr. Lalla Brothers for consideration of watchman device. She was seen by Dr. Lalla Brothers 01/23/23 and felt  not a candidate for watchman due to hx of unprovoked PE in 2014. She was hospitalized 02/04/2023 with EKG concerning for STEMI - STEMI canceled due to mild and flat troponin elevation and lack of chest pain. Presented for nausea, disorientation, and slurred speech. MRI brain with small acute infarct. She developed Afib that was rate controlled with 75 mg toprol. Eliquis was initially resumed at 2.5 mg BID given GI bleed with plans to increase to 5 mg BID at follow up. Neurology stated infarct was small and hemorrhagic conversion risk was low.   HFpEF treated with IV lasix. Jardiance  was stopped due to worsening renal function, continued 15 mg imdur, statin, and zetia. She was discharged 02/14/23 and was seen in follow up 02/24/23 by Carlos Levering NP. She was taking lasix every 2-3 days with stable weight. She was monitoring her rhythm on her apple watch that showed only brief episodes of Afib. She was hypertensive 200/80 --> 162/72 on recheck. Hydralazine was increased to 50 mg TID.   Apparently last week she ate an entire meat pizza, has been eating bacon, and drinking excessive water. She presented to [facility]02[facility]23 with dyspnea, lower extremity edema, and orthopnea. She used her PRN O2 at home and inhalers, but did not experience relief prompting evaluation.    On arrival, HR 85, BP 197/109, O2 98% on RA.  She was treated with IV hydralazine and 60 mg IV lasix  Hypokalemia was replaced with IV and PO K  During my interview, daughter is bedside. She admits to dietary indiscretion. Daughter reports the patient loves salt and does not like eating leftovers. She denies chest pain. She was found lying nearly completely flat and denies orthopnea. SOB is improved but remains on HF O2.    Past Medical History:  Diagnosis Date   Abdominal aortic aneurysm (HCC)    Anemia, improved 01/11/2013   Basal cell carcinoma    CAD (coronary artery disease)    hx CABG 1998, last cath 2007 with PCI and stenting to the proximal segment of the vein graft to the diagonal branch. DES Taxus was placed   Cervicalgia    Chronic fatigue fibromyalgia syndrome    Chronic  pain syndrome    Depression    Fibromyalgia    History of nuclear stress test 08/07/2011   lexiscan; no evidence of ischemia or infarct; low risk    Hyperlipidemia    Hypertension    OSA (obstructive sleep apnea)    PAD (peripheral artery disease) (HCC)    Pes anserine bursitis    Pulmonary embolism (HCC) 01/11/2013   S/P resection of aortic aneurysm    Trochanteric bursitis    Ulcerative colitis [facility])     Past  Surgical History:  Procedure Laterality Date   AORTOBIEXTERNAL ILIAC BYPASS  1991   BIOPSY  11/25/2022   Procedure: BIOPSY;  Surgeon: Jeani Hawking, MD;  Location: [facility] ENDOSCOPY;  Service: Gastroenterology;;   CATARACT EXTRACTION Left 09/2019   CHOLECYSTECTOMY  1997   COLONOSCOPY WITH PROPOFOL N/A 11/25/2022   Procedure: COLONOSCOPY WITH PROPOFOL;  Surgeon: Jeani Hawking, MD;  Location: [facility] ENDOSCOPY;  Service: Gastroenterology;  Laterality: N/A;   CORONARY ANGIOPLASTY WITH STENT PLACEMENT  2006   to LAD, ATRETIC LIMA AT THAT TIME ALSO   CORONARY ANGIOPLASTY WITH STENT PLACEMENT  2007   STENT TO PROX VG-DIAG, OTHER STENTS PATENT   CORONARY ARTERY BYPASS GRAFT  1998   LIMA-LAD;VG-DIAG & RCA   CORONARY STENT PLACEMENT  2005   TO LAD & LCX-STENTS,   ESOPHAGOGASTRODUODENOSCOPY (EGD) WITH PROPOFOL N/A 11/25/2022   Procedure: ESOPHAGOGASTRODUODENOSCOPY (EGD) WITH PROPOFOL;  Surgeon: Jeani Hawking, MD;  Location: [name] ENDOSCOPY;  Service: Gastroenterology;  Laterality: N/A;   HERNIA REPAIR     TUBAL LIGATION     BTL     Home Medications:  Prior to Admission medications   Medication Sig Start Date End Date Taking? Authorizing Provider  apixaban (ELIQUIS) 2.5 MG TABS tablet Take 1 tablet (2.5 mg total) by mouth 2 (two) times daily. 11/28/22  Yes Willeen Niece, MD  atorvastatin (LIPITOR) 40 MG tablet Take 1 tablet (40 mg total) by mouth daily. 02/15/23  Yes Regalado, Belkys A, MD  b complex vitamins capsule Take 1 capsule by mouth daily.   Yes [provider]  citalopram (CELEXA) 20 MG tablet Take 1 tablet (20 mg total) by mouth daily. 02/14/23 03/16/23 Yes Regalado, Belkys A, MD  furosemide (LASIX) 40 MG tablet Take 1 tablet (40 mg total) by mouth daily as needed. 02/18/23 05/19/23 Yes Hilty, Lisette Abu, MD  hydrALAZINE (APRESOLINE) 50 MG tablet Take 1 tablet (50 mg total) by mouth 3 (three) times daily. 02/24/23 05/25/23 Yes Wittenborn, Gavin Pound, NP  HYDROcodone-acetaminophen (NORCO/VICODIN)  5-325 MG tablet Take 1 tablet by mouth 4 (four) times daily as needed for moderate pain. May Take an extra 0.5 to one tablet when pain is severe. 02/25/23  Yes Jones Bales, NP  metoprolol succinate (TOPROL-XL) 25 MG 24 hr tablet Take 3 tablets (75 mg total) by mouth daily. Take with or immediately following a meal. 02/15/23  Yes Regalado, Belkys A, MD  Multiple Vitamin (MULITIVITAMIN WITH MINERALS) TABS Take 1 tablet by mouth daily.   Yes [provider]  ondansetron (ZOFRAN-ODT) 4 MG disintegrating tablet Take 1 tablet (4 mg total) by mouth every 8 (eight) hours. 02/14/23  Yes Regalado, Belkys A, MD  pantoprazole (PROTONIX) 40 MG tablet Take 1 tablet (40 mg total) by mouth 2 (two) times daily. 02/14/23  Yes Regalado, Belkys A, MD  potassium chloride (KLOR-CON) 10 MEQ tablet Take 1 tablet (10 mEq total) by mouth daily as needed. Take with furosemide 02/18/23 05/19/23 Yes Hilty, Lisette Abu, MD  traZODone [facility])  50 MG tablet Take 1 tablet (50 mg total) by mouth at bedtime as needed for sleep. 02/14/23 03/16/23 Yes Regalado, Belkys A, MD  isosorbide mononitrate (IMDUR) 30 MG 24 hr tablet Take 0.5 tablets (15 mg total) by mouth daily. Patient not taking: Reported on 03/02/2023 12/26/22 03/26/23  Joylene Grapes, NP    Inpatient Medications: Scheduled Meds:  apixaban  2.5 mg Oral BID   atorvastatin  40 mg Oral Daily   citalopram  20 mg Oral Daily   furosemide  40 mg Intravenous Daily   hydrALAZINE  50 mg Oral TID   metoprolol succinate  75 mg Oral Daily   pantoprazole  40 mg Oral BID   Continuous Infusions:  magnesium sulfate bolus IVPB 4 g (03/03/23 1112)   potassium chloride     PRN Meds: acetaminophen **OR** acetaminophen, hydrALAZINE, HYDROcodone-acetaminophen, ipratropium-albuterol, morphine injection, ondansetron **OR** ondansetron (ZOFRAN) IV, traZODone  Allergies:    Allergies  Allergen Reactions   Biotin Hives   Lisinopril Cough   Atenolol Other (See Comments)     colitis Other reaction(s): Bowel issues   Duloxetine Hcl Other (See Comments)    Mad pt feel spacey Other reaction(s): shaky; tired in the PMs   Lyrica [Pregabalin] Other (See Comments)    Made pt feel spacey   Penicillin G Rash    Other reaction(s): rash   Penicillins Rash   Sulfamethoxazole-Trimethoprim Nausea And Vomiting    Other reaction(s): elevated liver functions    Social History:   Social History   Socioeconomic History   Marital status: Divorced    Spouse name: Not on file   Number of children: 2   Years of education: college    Highest education level: Not on file  Occupational History    Employer: RETIRED  Tobacco Use   Smoking status: Former    Current packs/day: 0.00    Average packs/day: 2.5 packs/day for 20.0 years (50.0 ttl pk-yrs)    Types: Cigarettes    Start date: 06/29/1970    Quit date: 06/29/1990    Years since quitting: 32.6    Passive exposure: Never   Smokeless tobacco: Never  Vaping Use   Vaping status: Never Used  Substance and Sexual Activity   Alcohol use: No   Drug use: No   Sexual activity: Not on file  Other Topics Concern   Not on file  Social History Narrative   Not on file   Social Determinants of Health   Financial Resource Strain: Not on file  Food Insecurity: No Food Insecurity (03/02/2023)   Hunger Vital Sign    Worried About Running Out of Food in the Last Year: Never true    Ran Out of Food in the Last Year: Never true  Transportation Needs: No Transportation Needs (03/02/2023)   PRAPARE - Administrator, Civil Service (Medical): No    Lack of Transportation (Non-Medical): No  Physical Activity: Not on file  Stress: Not on file  Social Connections: Not on file  Intimate Partner Violence: Not At Risk (03/02/2023)   Humiliation, Afraid, Rape, and Kick questionnaire    Fear of Current or Ex-Partner: No    Emotionally Abused: No    Physically Abused: No    Sexually Abused: No    Family History:     Family History  Problem Relation Age of Onset   Heart disease Father    Heart attack Father        @ 83   Cancer Paternal  Aunt        BREAST   Heart failure Mother        at 63   Healthy Sister    Cancer Brother        pancreatic    Heart failure Maternal Grandmother    Heart attack Maternal Grandfather    Heart attack Paternal Grandmother        at 68   Healthy Daughter    Healthy Daughter      ROS:  Please see the history of present illness.   All other ROS reviewed and negative.     Physical Exam/Data:   Vitals:   03/02/23 2312 03/02/23 2312 03/03/23 0207 03/03/23 0538  BP: (!) 170/75 (!) 170/75 (!) 165/81 (!) 158/82  Pulse:  75 79 93  Resp:   (!) 22 20  Temp:   97.7 F (36.5 C) 98 F (36.7 C)  TempSrc:   Oral Oral  SpO2:  100% 98% (!) 89%  Weight:      Height:        Intake/Output Summary (Last 24 hours) at 03/03/2023 1118 Last data filed at 03/03/2023 1052 Gross per 24 hour  Intake 590 ml  Output 5100 ml  Net -4510 ml      03/02/2023    5:40 PM 03/02/2023   11:03 AM 02/25/2023    2:03 PM  Last 3 Weights  Weight (lbs) 190 lb 12.8 oz 190 lb 193 lb 12.8 oz  Weight (kg) 86.546 kg 86.183 kg 87.907 kg     Body mass index is 33.27 kg/m.  General:  Well nourished, well developed, in no acute distress HEENT: normal Neck: no JVD Vascular: No carotid bruits; Distal pulses 2+ bilaterally Cardiac:  irregular rhythm, regular rate Lungs:  clear in upper lobes, crackles in bases Abd: soft, nontender, no hepatomegaly  Ext: minimal B LE edema Musculoskeletal:  No deformities, BUE and BLE strength normal and equal Skin: warm and dry  Neuro:  CNs 2-12 intact, no focal abnormalities noted Psych:  Normal affect   EKG:  The EKG was personally reviewed and demonstrates: SR with HR 81 with sinus arrhythmia vs PACs  Telemetry:  Telemetry was personally reviewed and demonstrates:  appears to be rate controlled Afib in the 80s  Relevant CV Studies:  Echo  02/05/23: 1. Left ventricular ejection fraction, by estimation, is 55 to 60%. The  left ventricle has normal function. The left ventricle has no regional  wall motion abnormalities. Left ventricular diastolic parameters are  indeterminate.   2. Right ventricular systolic function is normal. The right ventricular  size is normal. There is normal pulmonary artery systolic pressure.   3. Left atrial size was mildly dilated.   4. The mitral valve is degenerative. Mild mitral valve regurgitation. No  evidence of mitral stenosis.   5. Partial fusion of right and left cusps. The aortic valve is tricuspid.  There is moderate calcification of the aortic valve. There is moderate  thickening of the aortic valve. Aortic valve regurgitation is mild.  Moderate aortic valve stenosis.   6. The inferior vena cava is normal in size with greater than 50%  respiratory variability, suggesting right atrial pressure of 3 mmHg.   Laboratory Data:  High Sensitivity Troponin:   Recent Labs  Lab 02/05/23 0537 02/05/23 1430 02/06/23 0711 03/02/23 1117 03/02/23 1310  TROPONINIHS 91* 633* 735* 24* 24*     Chemistry Recent Labs  Lab 03/02/23 1117 03/03/23 0352  NA 138 142  K  2.6* 2.8*  CL 96* 97*  CO2 30 32  GLUCOSE 115* 106*  BUN 16 14  CREATININE 1.33* 1.23*  CALCIUM 8.8* 8.6*  MG 1.6*  --   GFRNONAA 40* 44*  ANIONGAP 12 13    Recent Labs  Lab 03/02/23 1117 03/03/23 0352  PROT 6.8 6.2*  ALBUMIN 3.5 3.2*  AST 19 19  ALT 14 13  ALKPHOS 52 48  BILITOT 0.6 0.7   Lipids No results for input(s): "CHOL", "TRIG", "HDL", "LABVLDL", "LDLCALC", "CHOLHDL" in the last 168 hours.  Hematology Recent Labs  Lab 03/02/23 1117 03/03/23 0352  WBC 8.4 8.7  RBC 4.27 4.20  HGB 9.0* 9.0*  HCT 30.1* 30.0*  MCV 70.5* 71.4*  MCH 21.1* 21.4*  MCHC 29.9* 30.0  RDW 17.9* 17.8*  PLT 213 186   Thyroid No results for input(s): "TSH", "FREET4" in the last 168 hours.  BNP Recent Labs  Lab 03/02/23 1117   BNP 671.2*    DDimer No results for input(s): "DDIMER" in the last 168 hours.   Radiology/Studies:  DG Chest 2 View  Result Date: 03/02/2023 CLINICAL DATA:  sob EXAM: CHEST - 2 VIEW COMPARISON:  CXR 02/10/23 FINDINGS: No pleural effusion. No pneumothorax. Status post median sternotomy and CABG. Unchanged cardiac and mediastinal contours. No focal airspace opacity. No radiographically apparent displaced rib fractures. Visualized upper abdomen is unremarkable. Vertebral body heights are maintained. IMPRESSION: No focal airspace opacity. Electronically Signed   By: Lorenza Cambridge M.D.   On: 03/02/2023 12:25     Assessment and Plan:   Acute on chronic diastolic heart failure Aortic stenosis - recent echo 02/05/23 with preserved EF - BNP 671 - has received 60 mg IV lasix and 40 mg IV lasix - 4.1 L urine output and overall 3.6 L net negative - CXR read negative for edema or vascular congestion - GDMT PTA: 40 mg lasix daily PRN, 15 mg imdur, 50 mg hydralazine TID - suspect she will now need standing diuretic instead of once every 2-3 days - will need to continue ongoing discussions regarding diet - will not repeat an echo given recent study - hold off on further diuresis until electrolytes replaced - repeat Mg and potassium levels this afternoon after replacement, if WNL, can continue 40 mg IV lasix this afternoon   Hypertensive urgency - suspect mild and flat troponin elevation likely related to demand ischemia  - BP a bit better controlled - agree with hydralazine, can also add imdur - limited by renal function   Moderate aortic stenosis - tricuspid valve, but fusion of left and right cusps - likely contributing to tendency for hypervolemia - may consider OP referral to structural heart team   Paroxysmal atrial fibrillation - Mg 1.6, K 2.8 - will give an additional 4 g IV Mg - has received IV and PO K - telemetry with rate controlled Afib - rate controlled on 75 mg toprol - she  reports she is in Afib nearly 100% now   Chronic anticoagulation Recent GI bleed Recent CVA - discharged on lower dose of eliquis 2.5 mg BID with plans to increase back to 5 mg BID - Hb 9.0 - baseline recently was 14.1 on 02/04/23, has been 9-10 in July - no signs of active bleeding - consider increasing back to 5 mg eliquis BID and monitor CBC while inpatient (was not increased prior to admission due to Hb still being in the 9-10 range, however given her high stroke risk consider increasing and monitoring  labs)   CAD s/p CABG - hs troponin 24 --> 24 - EKG does not appear ischemic   Hyperlipidemia with LDL goal < 70 02/06/2023: Cholesterol 144; HDL 46; LDL Cholesterol 80; Triglycerides 92; VLDL 18 Continue statin   CKD III - sCr 1.23 - baseline 1.02-1.3 - monitor electrolyte abnormality  - given CrCl, hold off on spironolactone    Risk Assessment/Risk Scores:   New York Heart Association (NYHA) Functional Class NYHA Class IV  CHA2DS2-VASc Score = 8   This indicates a 10.8% annual risk of stroke. The patient's score is based upon: CHF History: 1 HTN History: 1 Diabetes History: 0 Stroke History: 2 Vascular Disease History: 1 Age Score: 2 Gender Score: 1    For questions or updates, please contact [facility] Please consult [email or web address] for contact info under    Signed, Marcelino Duster, PA  03/03/2023 11:18 AM

## 2023-03-03 NOTE — Evaluation (Signed)
Physical Therapy Evaluation Patient Details Name: Kara Bush MRN: [id number] DOB: 02/04/1941 Today's Date: 03/03/2023  History of Present Illness  82 yo female admitted with CHF exac, edema, dyspnea. Hx of obesity, OSA, CAD, CABG, chronic fatigue, chronic pain, fibromyalgia, falls, PE, AAA, UC, CHF, CVA  Clinical Impression  On eval, pt was Min guard A for mobility. She walked ~60 feet without a device. O2 86% on RA, dyspnea 2/4 with ambulation. Will plan to follow and progress activity as tolerated. Pt would like to resume HHPT upon discharge.         If plan is discharge home, recommend the following: A little help with bathing/dressing/bathroom;Assistance with cooking/housework;Assist for transportation;Help with stairs or ramp for entrance   Can travel by private vehicle        Equipment Recommendations None recommended by PT  Recommendations for Other Services       Functional Status Assessment Patient has had a recent decline in their functional status and demonstrates the ability to make significant improvements in function in a reasonable and predictable amount of time.     Precautions / Restrictions Precautions Precautions: Sternal Precaution Comments: monitor O2 Restrictions Weight Bearing Restrictions: No      Mobility  Bed Mobility   Bed Mobility: Supine to Sit     Supine to sit: Min assist     General bed mobility comments: Assist to sit up (provided by NT at pt's request)    Transfers Overall transfer level: Needs assistance   Transfers: Sit to/from Stand Sit to Stand: Supervision                Ambulation/Gait Ambulation/Gait assistance: Min guard Gait Distance (Feet): 60 Feet Assistive device: None Gait Pattern/deviations: Step-through pattern, Decreased stride length       General Gait Details: O2 86% on RA, dyspnea 2/4-limiting amb distance. No overt LOB.  Stairs            Wheelchair Mobility     Tilt Bed     Modified Rankin (Stroke Patients Only)       Balance Overall balance assessment: Needs assistance, History of Falls         Standing balance support: During functional activity Standing balance-Leahy Scale: Fair                               Pertinent Vitals/Pain Pain Assessment Pain Assessment: Faces Faces Pain Scale: Hurts little more Pain Location: L UE, back Pain Descriptors / Indicators: Discomfort, Sore Pain Intervention(s): Limited activity within patient's tolerance, Monitored during session, Repositioned    Home Living Family/patient expects to be discharged to:: Private residence Living Arrangements: Children Available Help at Discharge: Family;Available PRN/intermittently Type of Home: House Home Access: Stairs to enter Entrance Stairs-Rails: Right;Left;Can reach both Entrance Stairs-Number of Steps: 4 Alternate Level Stairs-Number of Steps: Flight Home Layout: Two level;Able to live on main level with bedroom/bathroom Home Equipment: Rollator (4 wheels);Cane - single point;Shower seat;Shower seat - built in;Grab bars - toilet;Hand held shower head      Prior Function Prior Level of Function : Independent/Modified Independent;Driving             Mobility Comments: reported having difficulty sometimes going up stairs ADLs Comments: Ind     Hand Dominance   Dominant Hand: Right    Extremity/Trunk Assessment        Lower Extremity Assessment Lower Extremity Assessment: Generalized weakness    Cervical /  Trunk Assessment Cervical / Trunk Assessment: Normal  Communication   Communication: HOH  Cognition Arousal/Alertness: Awake/alert Behavior During Therapy: WFL for tasks assessed/performed Overall Cognitive Status: Within Functional Limits for tasks assessed                                          General Comments      Exercises     Assessment/Plan    PT Assessment Patient needs continued PT  services  PT Problem List Decreased strength;Decreased range of motion;Decreased activity tolerance;Decreased balance;Decreased mobility;Decreased knowledge of use of DME;Decreased safety awareness       PT Treatment Interventions DME instruction;Gait training;Stair training;Functional mobility training;Therapeutic exercise;Therapeutic activities;Patient/family education;Balance training    PT Goals (Current goals can be found in the Care Plan section)  Acute Rehab PT Goals Patient Stated Goal: to breathe better PT Goal Formulation: With patient Time For Goal Achievement: 03/17/23 Potential to Achieve Goals: Good    Frequency Min 1X/week     Co-evaluation               AM-PAC PT "6 Clicks" Mobility  Outcome Measure Help needed turning from your back to your side while in a flat bed without using bedrails?: None Help needed moving from lying on your back to sitting on the side of a flat bed without using bedrails?: None Help needed moving to and from a bed to a chair (including a wheelchair)?: None Help needed standing up from a chair using your arms (e.g., wheelchair or bedside chair)?: None Help needed to walk in hospital room?: A Little Help needed climbing 3-5 steps with a railing? : A Little 6 Click Score: 22    End of Session   Activity Tolerance: Patient tolerated treatment well;Patient limited by fatigue (limited by drop in O2 sats/dyspnea) Patient left: in chair;with call bell/phone within reach   PT Visit Diagnosis: Difficulty in walking, not elsewhere classified (R26.2)    Time: 1515-1530 PT Time Calculation (min) (ACUTE ONLY): 15 min   Charges:   PT Evaluation $PT Eval Low Complexity: 1 Low   PT General Charges $$ ACUTE PT VISIT: 1 Visit          Faye Ramsay, PT Acute Rehabilitation  Office: [phone]

## 2023-03-03 NOTE — Plan of Care (Signed)
  Problem: Cardiac: Goal: Ability to achieve and maintain adequate cardiopulmonary perfusion will improve Outcome: Progressing   Problem: Education: Goal: Knowledge of General Education information will improve Description: Including pain rating scale, medication(s)/side effects and non-pharmacologic comfort measures Outcome: Progressing   Problem: Clinical Measurements: Goal: Will remain free from infection Outcome: Progressing   Problem: Nutrition: Goal: Adequate nutrition will be maintained Outcome: Progressing

## 2023-03-03 NOTE — Progress Notes (Signed)
O2 Sats sitting on EOB 93-94% RA. After ambulating to BR, O2 Sats dropped to 88% RA. 1L/[place] applied and O2 Sats increased to 93%.

## 2023-03-03 NOTE — Progress Notes (Signed)
PROGRESS NOTE  Kara Bush  DOB: [birth year]  PCP: Jackelyn Poling, DO GMW:[id number]  DOA: 03/02/2023  LOS: 0 days  Hospital Day: 2  Brief narrative: Kara Bush is a 82 y.o. female with PMH significant for HTN, HLD, obesity, OSA, CAD/CABG/stent, PAD, AAA s/p resection, PE on Eliquis, CHF on only as needed home oxygen, chronic pain syndrome, fibromyalgia, ulcerative colitis April 2024, patient was hospitalized for acute blood loss anemia with a hemoglobin of 6, received several units of PRBCs.  Colonoscopy 4/23 showed mild ulcerative colitis and a single nonbleeding colon lesion.  Eliquis was resumed at discharge at a reduced dose. Recently hospitalized 7/3 to 7/13 for acute CVA, AKI, intractable nausea, vomiting, weight loss -managed conservatively with continues on Eliquis, IV hydration, medication adjustment.  Because of AKI, Lasix/Entresto was held at discharge.  At home, patient noticed worsening pedal edema and self started Lasix to take every few days.   7/23, followed up at the cardiologist office.  Blood pressure was noted to be elevated.  Hydralazine dose was increased.  Metoprolol, isosorbide and Lasix were continued  7/29, patient was brought to the ED by EMS with complaint of progressively worsening pedal edema, shortness of breath, orthopnea and has been using her as needed home oxygen continuously at 4 L/min.  In the ED patient afebrile, heart rate in 80s, blood pressure 204/82, required 3 to 4 L oxygen to keep O2 sat more than 90% Initial labs with VBG showing alkalotic pH of 7.62, pCO2 39, bicarb 40 CBC with hemoglobin 9 BMP with potassium low at 2.6, creatinine 1.33, magnesium 1.6, BNP 671, troponin 24 Respiratory virus panel unremarkable Chest x-ray unremarkable  Patient was given IV hydralazine, IV Lasix, potassium and magnesium supplement Admitted to [facility]   Subjective: Patient was seen and examined this morning.  Pleasant elderly Caucasian female.  Sitting  up in recliner.  Not in distress.  On 3 L oxygen by nasal cannula.  Daughter at bedside. Chart reviewed Potassium still low at 2.8, creatinine 1.23, hemoglobin 9  Assessment and plan: Acute exacerbation of chronic diastolic congestive heart failure Essential hypertension Presented with progressively worsening volume overload symptoms apparently since reduction in dose and frequency of Lasix in last admission for AKI. Clinically with some pedal edema BNP was elevated Started on IV Lasix 40 mg daily. Other PTA meds: Toprol 75 mg daily, hydralazine 50 mg 3 times daily Continue Toprol, hydralazine Last echo 02/05/2023 with EF 55 to 60%, moderate aortic valve stenosis Cardiology consult appreciated. Structural heart disease evaluation as an outpatient for moderate aortic stenosis. Net IO Since Admission: -4,390 mL [03/03/23 1418] Continue to monitor for daily intake output, weight, blood pressure, BNP, renal function and electrolytes. Recent Labs  Lab 03/02/23 1117 03/03/23 0352  BNP 671.2*  --   BUN 16 14  CREATININE 1.33* 1.23*  NA 138 142  K 2.6* 2.8*  MG 1.6*  --    Hypokalemia/hypomagnesemia Replacement given.  Repeat tomorrow.  CKD 3B Creatinine at baseline currently less than 1.5.  Continue to monitor on IV Lasix.   Paroxysmal A-fib Currently in A-fib, rate controlled on metoprolol During the hospitalization in April, Eliquis dose was reduced from 5 mg twice daily to 2.5 mg twice daily because of elevated risk of bleeding.  Her hemoglobin has been stable for several weeks now.  Given her high CHA2DS2-VASc score of more than 6, she needs more protection with regular dose of Eliquis.  Discussed with cardiology and increased to 5 mg twice  daily.  Elevated troponin H/o CAD/CABG/stent H/o PAD, AAA s/p resection HLD Mildly elevated troponin.  Likely demand ischemia in the setting of CHF exacerbation Continue metoprolol, Lipitor. Eliquis plan as above Recent Labs     03/02/23 1117 03/02/23 1310  TROPONINIHS 24* 24*   H/o PE Eliquis plan as above  Chronic microcytic anemia Chronic iron deficiency H/o ulcerative colitis April 2024, patient was hospitalized for acute blood loss anemia with a hemoglobin of 6, received several units of PRBCs.  Colonoscopy 4/23 showed mild ulcerative colitis and a single nonbleeding colon lesion.  Eliquis was resumed at discharge at a reduced dose. Hemoglobin at baseline close to 9.  Remained stable at this time. Continue Protonix Recent Labs    11/24/22 0225 11/24/22 1802 11/25/22 0301 02/06/23 0259 02/07/23 0302 02/10/23 1321 02/11/23 0444 02/12/23 0424 03/02/23 1117 03/03/23 0352  HGB 7.4* 8.9*   < > 10.8*   < > 9.6* 10.2* 9.6* 9.0* 9.0*  MCV 68.6*  --    < > 73.1*   < > 73.4* 73.7* 73.9* 70.5* 71.4*  VITAMINB12  --  381  --  370  --   --   --   --   --   --   FOLATE >40.0  --   --  33.6  --   --   --   --   --   --   FERRITIN  --  7*  --  13  --   --   --   --   --   --   TIBC  --  479*  --  438  --   --   --   --   --   --   IRON  --  73  --  76  --   --   --   --   --   --   RETICCTPCT 1.8  --   --  1.5  --   --   --   --   --   --    < > = values in this interval not displayed.   OSA Not been using CPAP recently due to device malfunction Hospital CPAP to try  Depression Continue Lexapro, trazodone    Mobility: Encourage ambulation.  PT eval  Goals of care   Code Status: DNR     DVT prophylaxis:   apixaban (ELIQUIS) tablet 5 mg   Antimicrobials: None Fluid: None Consultants: Cardiology Family Communication: Daughter at bedside  Status: Inpatient Level of care:  Progressive   Patient from: Home Anticipated d/c to: Pending clinical course Needs to continue in-hospital care:  Ongoing diuresis   Diet:  Diet Order             Diet Heart Room service appropriate? Yes; Fluid consistency: Thin; Fluid restriction: 1200 mL Fluid  Diet effective now                   Scheduled  Meds:  apixaban  5 mg Oral BID   atorvastatin  40 mg Oral Daily   citalopram  20 mg Oral Daily   furosemide  40 mg Intravenous Daily   hydrALAZINE  50 mg Oral TID   metoprolol succinate  75 mg Oral Daily   pantoprazole  40 mg Oral BID    PRN meds: acetaminophen **OR** acetaminophen, hydrALAZINE, HYDROcodone-acetaminophen, ipratropium-albuterol, morphine injection, ondansetron **OR** ondansetron (ZOFRAN) IV, traZODone   Infusions:   potassium chloride      Antimicrobials:  Anti-infectives (From admission, onward)    None       Nutritional status:  Body mass index is 33.27 kg/m.          Objective: Vitals:   03/03/23 0207 03/03/23 0538  BP: (!) 165/81 (!) 158/82  Pulse: 79 93  Resp: (!) 22 20  Temp: 97.7 F (36.5 C) 98 F (36.7 C)  SpO2: 98% (!) 89%    Intake/Output Summary (Last 24 hours) at 03/03/2023 1418 Last data filed at 03/03/2023 1300 Gross per 24 hour  Intake 710 ml  Output 5100 ml  Net -4390 ml   Filed Weights   03/02/23 1103 03/02/23 1740  Weight: 86.2 kg 86.5 kg   Weight change:  Body mass index is 33.27 kg/m.   Physical Exam: General exam: Pleasant elderly Caucasian female.  Not in pain Skin: No rashes, lesions or ulcers. HEENT: Atraumatic, normocephalic, no obvious bleeding Lungs: Clear to auscultate bilaterally CVS: Irregular rhythm, controlled rate, mild ejection systolic murmur GI/Abd soft, nontender, nondistended, bowel sound present CNS: Alert, awake, oriented x 3 Psychiatry: Mood appropriate Extremities: Trace to 1+ bilateral pedal edema, no calf tenderness  Data Review: I have personally reviewed the laboratory data and studies available.  F/u labs ordered Unresulted Labs (From admission, onward)     Start     Ordered   03/04/23 0500  Basic metabolic panel  Tomorrow morning,   R        03/03/23 1418   03/04/23 0500  CBC with Differential/Platelet  Tomorrow morning,   R        03/03/23 1418   03/04/23 0500  Magnesium   Tomorrow morning,   R        03/03/23 1418   03/04/23 0500  Phosphorus  Tomorrow morning,   R        03/03/23 1418            Total time spent in review of labs and imaging, patient evaluation, formulation of plan, documentation and communication with family: 55 minutes  Signed, Lorin Glass, MD [facility] 03/03/2023

## 2023-03-03 NOTE — TOC Initial Note (Addendum)
Transition of Care [facility]) - Initial/Assessment Note    Patient Details  Name: Kara Bush MRN: [id number] Date of Birth: [birth year]  [facility] of [facility]) CM/SW Contact:    Howell Rucks, RN Phone Number: 03/03/2023, 11:18 AM  Clinical Narrative: [facility] consult for SNF Placement and Heart Failure Navigation Team (order already entered). Request sent to attending to enter PT eval, await recommendation. TOC will continue to follow  -12:01pm Met with pt and pt's dtr at bedside to introduce role of TOC/NCM and review for dc planning. Pt/dtr confirmed pt has a PCP and pharmacy in place, has Home 02 through [facility], dtr reports pt uses home 02 as needed, does not have require portable 02 at this time. Reports pt was receiving HH PT though [facility],  Home DME: shower chair, toilet lift.  Pt/dtr had no questions/concerns. TOC will continue to follow.                        Patient Goals and CMS Choice            Expected Discharge Plan and Services                                              Prior Living Arrangements/Services                       Activities of Daily Living Home Assistive Devices/Equipment: CPAP, Eyeglasses, Hearing aid ADL Screening (condition at time of admission) Patient's cognitive ability adequate to safely complete daily activities?: Yes Is the patient deaf or have difficulty hearing?: Yes Does the patient have difficulty seeing, even when wearing glasses/contacts?: No Does the patient have difficulty concentrating, remembering, or making decisions?: No Patient able to express need for assistance with ADLs?: Yes Does the patient have difficulty dressing or bathing?: No Independently performs ADLs?: Yes (appropriate for developmental age) Does the patient have difficulty walking or climbing stairs?: Yes Weakness of Legs: Both Weakness of Arms/Hands: Both  Permission Sought/Granted                  Emotional  Assessment              Admission diagnosis:  Shortness of breath [R06.02] Acute on chronic diastolic congestive heart failure (HCC) [I50.33] Acute on chronic systolic CHF (congestive heart failure) (HCC) [I50.23] Patient Active Problem List   Diagnosis Date Noted   Acute on chronic systolic CHF (congestive heart failure) (HCC) 03/03/2023   Acute on chronic diastolic congestive heart failure (HCC) 03/02/2023   Pulmonary embolism with infarction (HCC) 03/02/2023   Chronic heart failure with preserved ejection fraction (HFpEF) (HCC) 02/11/2023   Hypervolemia 02/11/2023   Acute encephalopathy 02/05/2023   Elevated troponin 02/05/2023   Hypokalemia 02/05/2023   Hypomagnesemia 02/05/2023   Intractable nausea and vomiting 02/05/2023   Metabolic alkalosis 02/05/2023   Demand ischemia 11/24/2022   Chest pain 11/23/2022   Non-ST elevation (NSTEMI) myocardial infarction (HCC) 11/23/2022   Acute gastrointestinal bleeding 11/23/2022   Chronic combined systolic (congestive) and diastolic (congestive) heart failure (HCC) 11/23/2022   CKD (chronic kidney disease) stage 3, GFR 30-59 ml/min (HCC) 11/23/2022   DNR (do not resuscitate) 11/23/2022   Anxiety disorder 09/25/2021   Paroxysmal atrial fibrillation (HCC) 02/26/2021   Atrial fibrillation (HCC) 09/09/2018   DOE (dyspnea on exertion) 08/10/2017  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   Greater trochanteric bursitis of right hip 11/07/2014   Sacro-iliac pain 11/07/2014   Chronic midline low back pain without sciatica 11/07/2013   Hepatitis 04/20/2013   Screening for STD (sexually transmitted disease) 04/20/2013   Recurrent UTI 04/20/2013   Abdominal aortic aneurysm (HCC)    Ulcerative colitis (HCC)    Obstructive sleep apnea    Fibromyalgia    Coronary artery disease    Basal cell carcinoma    Symptomatic anemia 01/11/2013   Ascending aortic aneurysm, 4.1 cm 01/11/2013   Cervical spondylolysis 10/15/2011   Chronic  periscapular pain 10/15/2011   Chest wall pain    S/P resection of aortic aneurysm, 1998 prior to CABG    PAD (peripheral artery disease), aortic-iliac bypass 1991, mild carotid bruits    OSA (obstructive sleep apnea)    Chronic fatigue syndrome with fibromyalgia    Hyperlipidemia    Ulcerative colitis    Depression    CAD (coronary artery disease), CABG 1998, STENTS MULTIPLE SITES SINCE.     Accelerated hypertension    PCP:  Jackelyn Poling, DO Pharmacy:   [id number] [place], [place] - [phone] [place] [address] Phone: [phone] Fax: [phone]     Social Determinants of Health (SDOH) Social History: SDOH Screenings   Food Insecurity: No Food Insecurity (03/02/2023)  Housing: Low Risk  (03/02/2023)  Transportation Needs: No Transportation Needs (03/02/2023)  Utilities: Not At Risk (03/02/2023)  Depression (PHQ2-9): Low Risk  (02/25/2023)  Tobacco Use: Medium Risk (03/02/2023)   SDOH Interventions:     Readmission Risk Interventions    02/11/2023    2:21 PM  Readmission Risk Prevention Plan  Transportation Screening Complete  PCP or Specialist Appt within 3-5 Days Complete  HRI or Home Care Consult Complete  Social Work Consult for Recovery Care Planning/Counseling Complete  Palliative Care Screening Not Applicable  Medication Review Oceanographer) Complete

## 2023-03-04 DIAGNOSIS — I480 Paroxysmal atrial fibrillation: Secondary | ICD-10-CM | POA: Diagnosis not present

## 2023-03-04 DIAGNOSIS — I5033 Acute on chronic diastolic (congestive) heart failure: Secondary | ICD-10-CM | POA: Diagnosis not present

## 2023-03-04 DIAGNOSIS — I2510 Atherosclerotic heart disease of native coronary artery without angina pectoris: Secondary | ICD-10-CM | POA: Diagnosis not present

## 2023-03-04 DIAGNOSIS — E785 Hyperlipidemia, unspecified: Secondary | ICD-10-CM | POA: Diagnosis not present

## 2023-03-04 LAB — CBC
HCT: 30.4 % — ABNORMAL LOW (ref 36.0–46.0)
Hemoglobin: 8.8 g/dL — ABNORMAL LOW (ref 12.0–15.0)
MCH: 21.3 pg — ABNORMAL LOW (ref 26.0–34.0)
MCHC: 28.9 g/dL — ABNORMAL LOW (ref 30.0–36.0)
MCV: 73.4 fL — ABNORMAL LOW (ref 80.0–100.0)
Platelets: 221 10*3/uL (ref 150–400)
RBC: 4.14 MIL/uL (ref 3.87–5.11)
RDW: 17.7 % — ABNORMAL HIGH (ref 11.5–15.5)
WBC: 9.5 10*3/uL (ref 4.0–10.5)
nRBC: 0 % (ref 0.0–0.2)

## 2023-03-04 MED ORDER — FUROSEMIDE 10 MG/ML IJ SOLN: 40.0000 mg | Freq: Two times a day (BID) | INTRAMUSCULAR | Status: DC

## 2023-03-04 MED ORDER — POTASSIUM CHLORIDE CRYS ER 20 MEQ PO TBCR
40.0000 meq | EXTENDED_RELEASE_TABLET | Freq: Once | ORAL | Status: AC
  Administered 2023-03-04: 40 meq via ORAL
  Filled 2023-03-04: qty 2

## 2023-03-04 MED ORDER — ISOSORBIDE MONONITRATE ER 30 MG PO TB24
30.0000 mg | ORAL_TABLET | Freq: Every day | ORAL | Status: DC
  Administered 2023-03-04 – 2023-03-07 (×4): 30 mg via ORAL
  Filled 2023-03-04 (×4): qty 1

## 2023-03-04 MED ORDER — FUROSEMIDE 10 MG/ML IJ SOLN
60.0000 mg | Freq: Two times a day (BID) | INTRAMUSCULAR | Status: DC
  Administered 2023-03-04 – 2023-03-05 (×2): 60 mg via INTRAVENOUS
  Filled 2023-03-04 (×2): qty 6

## 2023-03-04 MED ORDER — SACUBITRIL-VALSARTAN 24-26 MG PO TABS
1.0000 | ORAL_TABLET | Freq: Two times a day (BID) | ORAL | Status: DC
  Administered 2023-03-04 – 2023-03-07 (×7): 1 via ORAL
  Filled 2023-03-04 (×7): qty 1

## 2023-03-04 NOTE — Progress Notes (Signed)
PROGRESS NOTE  Kara Bush  DOB: 1940/11/08  PCP: Jackelyn Poling, DO ZOX:[id number]  DOA: 03/02/2023  LOS: 1 day  Hospital Day: 3  Brief narrative: Kara Bush is a 82 y.o. female with PMH significant for HTN, HLD, obesity, OSA, CAD/CABG/stent, PAD, AAA s/p resection, PE on Eliquis, CHF on only as needed home oxygen, chronic pain syndrome, fibromyalgia, ulcerative colitis April 2024, patient was hospitalized for acute blood loss anemia with a hemoglobin of 6, received several units of PRBCs.  Colonoscopy 4/23 showed mild ulcerative colitis and a single nonbleeding colon lesion.  Eliquis was resumed at discharge at a reduced dose. Recently hospitalized 7/3 to 7/13 for acute CVA, AKI, intractable nausea, vomiting, weight loss -managed conservatively with continues on Eliquis, IV hydration, medication adjustment.  Because of AKI, Lasix/Entresto was held at discharge.  At home, patient noticed worsening pedal edema and self started Lasix to take every few days.   7/23, followed up at the cardiologist office.  Blood pressure was noted to be elevated.  Hydralazine dose was increased.  Metoprolol, isosorbide and Lasix were continued  7/29, patient was brought to the ED by EMS with complaint of progressively worsening pedal edema, shortness of breath, orthopnea and has been using her as needed home oxygen continuously at 4 L/min.  In the ED patient afebrile, heart rate in 80s, blood pressure 204/82, required 3 to 4 L oxygen to keep O2 sat more than 90% Initial labs with VBG showing alkalotic pH of 7.62, pCO2 39, bicarb 40 CBC with hemoglobin 9 BMP with potassium low at 2.6, creatinine 1.33, magnesium 1.6, BNP 671, troponin 24 Respiratory virus panel unremarkable Chest x-ray unremarkable  Patient was given IV hydralazine, IV Lasix, potassium and magnesium supplement Admitted to [facility]   Subjective: Patient was seen and examined this morning.   Sitting up in recliner.  Not in distress.   On supplemental oxygen.   On IV diuresis.  Assessment and plan: Acute exacerbation of chronic diastolic congestive heart failure Essential hypertension Presented with progressively worsening volume overload symptoms apparently since reduction in dose and frequency of Lasix in last admission for AKI. Clinically with some pedal edema BNP was elevated Cardiology consultation was obtained Started on IV Lasix 40 mg daily. 5 L negative balance of 5.  Increased to IV 60 mg twice daily today. Prior to admission, patient was also on toprol 75 mg daily, hydralazine 50 mg 3 times daily Currently controlled on both.  Imdur and Entresto added as well per cardiology. Last echo 02/05/2023 with EF 55 to 60%, moderate aortic valve stenosis Cardiology consult appreciated. Structural heart disease evaluation as an outpatient for moderate aortic stenosis. Recommended dietary discretion. Net IO Since Admission: -5,180 mL [03/04/23 1315] Continue to monitor for daily intake output, weight, blood pressure, BNP, renal function and electrolytes. Recent Labs  Lab 03/02/23 1117 03/03/23 0352 03/04/23 0414  BNP 671.2*  --   --   BUN 16 14 13   CREATININE 1.33* 1.23* 1.21*  NA 138 142 138  K 2.6* 2.8* 3.0*  MG 1.6*  --  2.3   Hypokalemia/hypomagnesemia Potassium low at 3 today.  Replacement given.  Continue to monitor with IV diuresis.  CKD 3B Creatinine at baseline currently less than 1.5.  Remains in baseline.   Paroxysmal A-fib Currently in A-fib, rate controlled on metoprolol During the hospitalization in April, Eliquis dose was reduced from 5 mg twice daily to 2.5 mg twice daily because of elevated risk of bleeding.  Her hemoglobin has  been stable for several weeks now.  Given her high CHA2DS2-VASc score of more than 6, she needs more protection with regular dose of Eliquis.  Discussed with cardiology and increased to 5 mg twice daily.  Elevated troponin H/o CAD/CABG/stent H/o PAD, AAA s/p  resection HLD Mildly elevated troponin.  Likely demand ischemia in the setting of CHF exacerbation Continue metoprolol, Lipitor. Eliquis plan as above Per cardiology, no plan of ischemic evaluation at this time. Recent Labs    03/02/23 1117 03/02/23 1310  TROPONINIHS 24* 24*   H/o PE Eliquis plan as above  Chronic microcytic anemia Chronic iron deficiency H/o ulcerative colitis April 2024, patient was hospitalized for acute blood loss anemia with a hemoglobin of 6, received several units of PRBCs.  Colonoscopy 4/23 showed mild ulcerative colitis and a single nonbleeding colon lesion.  Hemoglobin at baseline close to 9.  Remained stable at this time. Continue Protonix Recent Labs    11/24/22 0225 11/24/22 1802 11/25/22 0301 02/06/23 0259 02/07/23 0302 02/11/23 0444 02/12/23 0424 03/02/23 1117 03/03/23 0352 03/04/23 0414  HGB 7.4* 8.9*   < > 10.8*   < > 10.2* 9.6* 9.0* 9.0* 8.6*  MCV 68.6*  --    < > 73.1*   < > 73.7* 73.9* 70.5* 71.4* 71.8*  VITAMINB12  --  381  --  370  --   --   --   --   --   --   FOLATE >40.0  --   --  33.6  --   --   --   --   --   --   FERRITIN  --  7*  --  13  --   --   --   --   --   --   TIBC  --  479*  --  438  --   --   --   --   --   --   IRON  --  73  --  76  --   --   --   --   --   --   RETICCTPCT 1.8  --   --  1.5  --   --   --   --   --   --    < > = values in this interval not displayed.   OSA Not been using CPAP recently due to device malfunction Hospital CPAP to try  Depression Continue Lexapro, trazodone    Mobility: Independently ambulating with mobility tech  Goals of care   Code Status: DNR     DVT prophylaxis:   apixaban (ELIQUIS) tablet 5 mg   Antimicrobials: None Fluid: None Consultants: Cardiology Family Communication: Daughter not at bedside today.  Status: Inpatient Level of care:  Progressive   Patient from: Home Anticipated d/c to: Pending clinical course Needs to continue in-hospital care:  Ongoing  diuresis   Diet:  Diet Order             Diet Heart Room service appropriate? Yes; Fluid consistency: Thin; Fluid restriction: 1200 mL Fluid  Diet effective now                   Scheduled Meds:  apixaban  5 mg Oral BID   atorvastatin  40 mg Oral Daily   citalopram  20 mg Oral Daily   furosemide  60 mg Intravenous BID   hydrALAZINE  50 mg Oral TID   isosorbide mononitrate  30 mg Oral Daily  metoprolol succinate  75 mg Oral Daily   pantoprazole  40 mg Oral BID   sacubitril-valsartan  1 tablet Oral BID    PRN meds: acetaminophen **OR** acetaminophen, hydrALAZINE, HYDROcodone-acetaminophen, ipratropium-albuterol, morphine injection, ondansetron **OR** ondansetron (ZOFRAN) IV, traZODone   Infusions:     Antimicrobials: Anti-infectives (From admission, onward)    None       Nutritional status:  Body mass index is 32.34 kg/m.          Objective: Vitals:   03/04/23 0407 03/04/23 0502  BP: (!) 189/68 (!) 156/74  Pulse: 85 92  Resp: 18 18  Temp: 99.3 F (37.4 C)   SpO2: 98% 97%    Intake/Output Summary (Last 24 hours) at 03/04/2023 1315 Last data filed at 03/04/2023 1100 Gross per 24 hour  Intake 360 ml  Output 1150 ml  Net -790 ml   Filed Weights   03/02/23 1103 03/02/23 1740 03/04/23 0706  Weight: 86.2 kg 86.5 kg 84.1 kg   Weight change:  Body mass index is 32.34 kg/m.   Physical Exam: General exam: Pleasant elderly Caucasian female.  Not in pain Skin: No rashes, lesions or ulcers. HEENT: Atraumatic, normocephalic, no obvious bleeding Lungs: Bibasilar crackles noted, left more than right. CVS: Irregular rhythm, controlled rate, mild ejection systolic murmur GI/Abd soft, nontender, nondistended, bowel sound present CNS: Alert,, oriented x 3 Psychiatry: Mood appropriate Extremities: Trace bilateral pedal edema, no calf tenderness  Data Review: I have personally reviewed the laboratory data and studies available.  F/u labs  ordered Unresulted Labs (From admission, onward)     Start     Ordered   03/04/23 1400  CBC  Once-Timed,   TIMED        03/04/23 1039            Total time spent in review of labs and imaging, patient evaluation, formulation of plan, documentation and communication with family: 45 minutes  Signed, Lorin Glass, MD [facility] 03/04/2023

## 2023-03-04 NOTE — TOC Progression Note (Addendum)
Transition of Care [facility]) - Progression Note    Patient Details  Name: Kara Bush MRN: [id number] Date of Birth: 06-07-1941  [facility]) CM/SW Contact  Howell Rucks, RN Phone Number: 03/04/2023, 2:12 PM  Clinical Narrative:  PT recommendation- HH PT. Met with pt at bedside, pt confirmed she is active with [name] for PT, agreeable to continue. NCM contacted [facility] rep-Cory, confirmed pt active with HH PT. TOC will continue to follow.          Expected Discharge Plan and Services                                               Social Determinants of Health (SDOH) Interventions SDOH Screenings   Food Insecurity: No Food Insecurity (03/02/2023)  Housing: Low Risk  (03/02/2023)  Transportation Needs: No Transportation Needs (03/02/2023)  Utilities: Not At Risk (03/02/2023)  Depression (PHQ2-9): Low Risk  (02/25/2023)  Tobacco Use: Medium Risk (03/02/2023)    Readmission Risk Interventions    02/11/2023    2:21 PM  Readmission Risk Prevention Plan  Transportation Screening Complete  PCP or Specialist Appt within 3-5 Days Complete  HRI or Home Care Consult Complete  Social Work Consult for Recovery Care Planning/Counseling Complete  Palliative Care Screening Not Applicable  Medication Review Oceanographer) Complete

## 2023-03-04 NOTE — Progress Notes (Signed)
Mobility Specialist - Progress Note   03/04/23 1022  Mobility  Activity Ambulated with assistance in hallway  Level of Assistance Standby assist, set-up cues, supervision of patient - no hands on  Assistive Device None  Distance Ambulated (ft) 80 ft  Range of Motion/Exercises Active  Activity Response Tolerated well  Mobility Referral Yes  $Mobility charge 1 Mobility  Mobility Specialist Start Time (ACUTE ONLY) 1010  Mobility Specialist Stop Time (ACUTE ONLY) 1020  Mobility Specialist Time Calculation (min) (ACUTE ONLY) 10 min   Pt received in chair and agreed to mobility. No issues throughout session, returned to chair with all needs met.   Marilynne Halsted Mobility Specialist

## 2023-03-04 NOTE — Plan of Care (Signed)
  Problem: Education: Goal: Knowledge of disease or condition will improve Outcome: Progressing Goal: Knowledge of secondary prevention will improve (MUST DOCUMENT ALL) Outcome: Progressing Goal: Knowledge of patient specific risk factors will improve (Mark N/A or DELETE if not current risk factor) Outcome: Progressing   

## 2023-03-04 NOTE — Progress Notes (Signed)
   03/04/23 0043  BiPAP/CPAP/SIPAP  BiPAP/CPAP/SIPAP Pt Type Adult  BiPAP/CPAP/SIPAP DREAMSTATIOND  Mask Type Full face mask  Mask Size Medium  Set Rate 0 breaths/min  Respiratory Rate 18 breaths/min  Flow Rate 2 lpm  Patient Home Equipment No  Auto Titrate Yes (5-20)

## 2023-03-04 NOTE — Progress Notes (Signed)
Rounding Note    Patient Name: Kara Bush Date of Encounter: 03/04/2023  [facility] Cardiologist: Chrystie Nose, MD   Subjective   Feeling better, will not wear compression socks, likes the feeling of wearing O2, urine output has dropped off  Inpatient Medications    Scheduled Meds:  apixaban  5 mg Oral BID   atorvastatin  40 mg Oral Daily   citalopram  20 mg Oral Daily   furosemide  40 mg Intravenous Daily   hydrALAZINE  50 mg Oral TID   metoprolol succinate  75 mg Oral Daily   pantoprazole  40 mg Oral BID   Continuous Infusions:  PRN Meds: acetaminophen **OR** acetaminophen, hydrALAZINE, HYDROcodone-acetaminophen, ipratropium-albuterol, morphine injection, ondansetron **OR** ondansetron (ZOFRAN) IV, traZODone   Vital Signs    Vitals:   03/03/23 2040 03/04/23 0407 03/04/23 0502 03/04/23 0706  BP:  (!) 189/68 (!) 156/74   Pulse:  85 92   Resp: (!) 23 18 18    Temp:  99.3 F (37.4 C)    TempSrc:      SpO2:  98% 97%   Weight:    84.1 kg  Height:        Intake/Output Summary (Last 24 hours) at 03/04/2023 1007 Last data filed at 03/04/2023 0854 Gross per 24 hour  Intake 350 ml  Output 1150 ml  Net -800 ml      03/04/2023    7:06 AM 03/02/2023    5:40 PM 03/02/2023   11:03 AM  Last 3 Weights  Weight (lbs) 185 lb 8 oz 190 lb 12.8 oz 190 lb  Weight (kg) 84.142 kg 86.546 kg 86.183 kg      Telemetry    Question bouts of sinus with wandering pacemaker vs Afib, rate controlled in the 80s - Personally Reviewed  ECG    No new tracings - Personally Reviewed  Physical Exam   GEN: No acute distress.   Neck: + JVD Cardiac: RRR, 3/6 systolic murmur Respiratory: crackles in bases GI: Soft, nontender, non-distended  MS: B LE edema mild redness on ankles from chronic edema Neuro:  Nonfocal  Psych: Normal affect   Labs    High Sensitivity Troponin:   Recent Labs  Lab 02/05/23 0537 02/05/23 1430 02/06/23 0711 03/02/23 1117  03/02/23 1310  TROPONINIHS 91* 633* 735* 24* 24*     Chemistry Recent Labs  Lab 03/02/23 1117 03/03/23 0352 03/04/23 0414  NA 138 142 138  K 2.6* 2.8* 3.0*  CL 96* 97* 94*  CO2 30 32 33*  GLUCOSE 115* 106* 110*  BUN 16 14 13   CREATININE 1.33* 1.23* 1.21*  CALCIUM 8.8* 8.6* 8.6*  MG 1.6*  --  2.3  PROT 6.8 6.2*  --   ALBUMIN 3.5 3.2*  --   AST 19 19  --   ALT 14 13  --   ALKPHOS 52 48  --   BILITOT 0.6 0.7  --   GFRNONAA 40* 44* 44*  ANIONGAP 12 13 11     Lipids No results for input(s): "CHOL", "TRIG", "HDL", "LABVLDL", "LDLCALC", "CHOLHDL" in the last 168 hours.  Hematology Recent Labs  Lab 03/02/23 1117 03/03/23 0352 03/04/23 0414  WBC 8.4 8.7 8.5  RBC 4.27 4.20 4.15  HGB 9.0* 9.0* 8.6*  HCT 30.1* 30.0* 29.8*  MCV 70.5* 71.4* 71.8*  MCH 21.1* 21.4* 20.7*  MCHC 29.9* 30.0 28.9*  RDW 17.9* 17.8* 17.9*  PLT 213 186 195   Thyroid No results for input(s): "TSH", "  FREET4" in the last 168 hours.  BNP Recent Labs  Lab 03/02/23 1117  BNP 671.2*    DDimer No results for input(s): "DDIMER" in the last 168 hours.   Radiology    DG Chest 2 View  Result Date: 03/02/2023 CLINICAL DATA:  sob EXAM: CHEST - 2 VIEW COMPARISON:  CXR 02/10/23 FINDINGS: No pleural effusion. No pneumothorax. Status post median sternotomy and CABG. Unchanged cardiac and mediastinal contours. No focal airspace opacity. No radiographically apparent displaced rib fractures. Visualized upper abdomen is unremarkable. Vertebral body heights are maintained. IMPRESSION: No focal airspace opacity. Electronically Signed   By: Lorenza Cambridge M.D.   On: 03/02/2023 12:25    Cardiac Studies   Echo 02/05/23: 1. Left ventricular ejection fraction, by estimation, is 55 to 60%. The  left ventricle has normal function. The left ventricle has no regional  wall motion abnormalities. Left ventricular diastolic parameters are  indeterminate.   2. Right ventricular systolic function is normal. The right ventricular   size is normal. There is normal pulmonary artery systolic pressure.   3. Left atrial size was mildly dilated.   4. The mitral valve is degenerative. Mild mitral valve regurgitation. No  evidence of mitral stenosis.   5. Partial fusion of right and left cusps. The aortic valve is tricuspid.  There is moderate calcification of the aortic valve. There is moderate  thickening of the aortic valve. Aortic valve regurgitation is mild.  Moderate aortic valve stenosis.   6. The inferior vena cava is normal in size with greater than 50%  respiratory variability, suggesting right atrial pressure of 3 mmHg.   Patient Profile     82 y.o. 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   Assessment & Plan    Acute on chronic diastolic heart failure Aortic stenosis - recent echo 02/05/23 with preserved EF - BNP 671 - has received 60 mg IV lasix and 40 mg IV lasix - 4.1 L urine output and overall 3.6 L net negative - CXR read negative for edema or vascular congestion - GDMT PTA: 40 mg lasix daily PRN, 15 mg imdur, 50 mg hydralazine TID - suspect she will now need standing diuretic instead of once every 2-3 days - will need to continue ongoing discussions regarding diet - will not repeat an echo given recent study - Mg 2.3, K remains 3.0 - received 40 mEq today, will give another 40 mEq at lunch given IV diuresis - BMP tomorrow - sCr 1.21 (1.23), BUN 13, CO2 33 - moderate aortic stenosis with fusion of left and right cusps, DNR status, likely contributing - diuresing on  40 mg IV lasix daily with 1.5 L urine output yesterday and weight down 5 lbs (but less diuresis than yesterday) - exam still volume up, now with potassium replacement I think we can increase lasix to 40 mg IV BID   Low sodium diet - extreme dietary indiscretion leading to present admission - consider consulting dietician for counseling   Hypertensive urgency - mild and flat troponin likely demand in the setting of hypertension - BP still uncontrolled -  currently on hydralazine 50 mg TID, 75 mg toprol - will add 30 mg imdur   PAF - electrolytes improving, continue to replace K - rate controlled Afib on 75 mg toprol - she reports Afib nearly 100% of the time now   Chronic anticoagulation Recent GI bleed Recent CVA - discharged on lower dose of eliquis 2.5 mg BID with plans to increase back to 5 mg BID - baseline recently was 14.1 on 02/04/23, has been 9-10 in July - no signs of active bleeding - increased to 5 mg eliquis yesterday with Hb now 8.6 (9.0) - recheck H/H this afternoon     CAD s/p CABG - hs troponin 24 --> 24 - EKG does not appear ischemic, likely due to CHF and HTN     Hyperlipidemia with LDL goal < 70 02/06/2023: Cholesterol 144; HDL 46; LDL Cholesterol 80; Triglycerides 92; VLDL 18 Continue statin     CKD III - sCr 1.21 - baseline 1.02-1.3 - monitor electrolytes - given CrCl, hold off on spironolactone    For questions or updates, please contact [facility] Please consult [email or web address] for contact info under        Signed, Marcelino Duster, PA  03/04/2023, 10:07 AM

## 2023-03-04 NOTE — Progress Notes (Signed)
   03/04/23 2318  BiPAP/CPAP/SIPAP  BiPAP/CPAP/SIPAP DREAMSTATIOND  Mask Type Full face mask  Mask Size Medium  Respiratory Rate 18 breaths/min  Flow Rate 2 lpm  Patient Home Equipment No  Auto Titrate Yes (5-20)

## 2023-03-05 DIAGNOSIS — I5033 Acute on chronic diastolic (congestive) heart failure: Secondary | ICD-10-CM | POA: Diagnosis not present

## 2023-03-05 DIAGNOSIS — I2510 Atherosclerotic heart disease of native coronary artery without angina pectoris: Secondary | ICD-10-CM | POA: Diagnosis not present

## 2023-03-05 DIAGNOSIS — N1832 Chronic kidney disease, stage 3b: Secondary | ICD-10-CM | POA: Diagnosis not present

## 2023-03-05 LAB — BASIC METABOLIC PANEL
Anion gap: 10 (ref 5–15)
BUN: 20 mg/dL (ref 8–23)
CO2: 34 mmol/L — ABNORMAL HIGH (ref 22–32)
Calcium: 8.3 mg/dL — ABNORMAL LOW (ref 8.9–10.3)
Chloride: 94 mmol/L — ABNORMAL LOW (ref 98–111)
Creatinine, Ser: 1.41 mg/dL — ABNORMAL HIGH (ref 0.44–1.00)
GFR, Estimated: 37 mL/min — ABNORMAL LOW (ref >60)
Glucose, Bld: 111 mg/dL — ABNORMAL HIGH (ref 70–99)
Potassium: 3.1 mmol/L — ABNORMAL LOW (ref 3.5–5.1)
Sodium: 138 mmol/L (ref 135–145)

## 2023-03-05 MED ORDER — FUROSEMIDE 10 MG/ML IJ SOLN
80.0000 mg | Freq: Two times a day (BID) | INTRAMUSCULAR | Status: DC
  Administered 2023-03-05: 80 mg via INTRAVENOUS
  Filled 2023-03-05: qty 8

## 2023-03-05 MED ORDER — ORAL CARE MOUTH RINSE: 15.0000 mL | OROMUCOSAL | Status: DC | PRN

## 2023-03-05 MED ORDER — POTASSIUM CHLORIDE CRYS ER 20 MEQ PO TBCR: 40.0000 meq | EXTENDED_RELEASE_TABLET | ORAL | Status: DC

## 2023-03-05 MED ORDER — HYDRALAZINE HCL 25 MG PO TABS
25.0000 mg | ORAL_TABLET | Freq: Three times a day (TID) | ORAL | Status: DC
  Administered 2023-03-05 – 2023-03-07 (×6): 25 mg via ORAL
  Filled 2023-03-05 (×6): qty 1

## 2023-03-05 MED ORDER — POTASSIUM CHLORIDE CRYS ER 20 MEQ PO TBCR
20.0000 meq | EXTENDED_RELEASE_TABLET | Freq: Two times a day (BID) | ORAL | Status: DC
  Administered 2023-03-05 – 2023-03-07 (×5): 20 meq via ORAL
  Filled 2023-03-05 (×5): qty 1

## 2023-03-05 MED ORDER — SPIRONOLACTONE 25 MG PO TABS
25.0000 mg | ORAL_TABLET | Freq: Every day | ORAL | Status: DC
  Administered 2023-03-05 – 2023-03-07 (×3): 25 mg via ORAL
  Filled 2023-03-05 (×3): qty 1

## 2023-03-05 MED ORDER — POTASSIUM CHLORIDE CRYS ER 20 MEQ PO TBCR
40.0000 meq | EXTENDED_RELEASE_TABLET | ORAL | Status: DC
  Administered 2023-03-05: 40 meq via ORAL
  Filled 2023-03-05: qty 2

## 2023-03-05 NOTE — Progress Notes (Signed)
Rounding Note    Patient Name: Kara Bush Date of Encounter: 03/05/2023  [facility] Cardiologist: Chrystie Nose, MD   Subjective   Feeling a little better.  +Back pain.  Slow urinary response to lasix.   Inpatient Medications    Scheduled Meds:  apixaban  5 mg Oral BID   atorvastatin  40 mg Oral Daily   citalopram  20 mg Oral Daily   furosemide  60 mg Intravenous BID   hydrALAZINE  50 mg Oral TID   isosorbide mononitrate  30 mg Oral Daily   metoprolol succinate  75 mg Oral Daily   pantoprazole  40 mg Oral BID   potassium chloride  40 mEq Oral Q3H   sacubitril-valsartan  1 tablet Oral BID   Continuous Infusions:  PRN Meds: acetaminophen **OR** acetaminophen, hydrALAZINE, HYDROcodone-acetaminophen, ipratropium-albuterol, morphine injection, ondansetron **OR** ondansetron (ZOFRAN) IV, mouth rinse, traZODone   Vital Signs    Vitals:   03/04/23 1325 03/04/23 1330 03/04/23 2012 03/05/23 0421  BP: (!) 118/57 120/83 (!) 128/51 (!) 146/61  Pulse: 77 77 71 81  Resp:  16 18 18   Temp:  98.2 F (36.8 C) 98.1 F (36.7 C) 98.7 F (37.1 C)  TempSrc:  Oral Oral Oral  SpO2:  100% 92% 93%  Weight:    84 kg  Height:        Intake/Output Summary (Last 24 hours) at 03/05/2023 1022 Last data filed at 03/05/2023 0900 Gross per 24 hour  Intake 960 ml  Output 1475 ml  Net -515 ml      03/05/2023    4:21 AM 03/04/2023    7:06 AM 03/02/2023    5:40 PM  Last 3 Weights  Weight (lbs) 185 lb 3 oz 185 lb 8 oz 190 lb 12.8 oz  Weight (kg) 84 kg 84.142 kg 86.546 kg      Telemetry    Atrial fibrillation.  Rate <100 BPM.  - Personally Reviewed  ECG    N/A - Personally Reviewed  Physical Exam   VS:  BP (!) 146/61 (BP Location: Right Arm)   Pulse 81   Temp 98.7 F (37.1 C) (Oral)   Resp 18   Ht 5' 3.5" (1.613 m)   Wt 84 kg   SpO2 93%   BMI 32.29 kg/m  , BMI Body mass index is 32.29 kg/m. GENERAL:  Well appearing HEENT: Pupils equal round and reactive,  fundi not visualized, oral mucosa unremarkable NECK:  No jugular venous distention, waveform within normal limits, carotid upstroke brisk and symmetric, no bruits, no thyromegaly LUNGS:  Basilar crackles HEART:  Irregularly irregular.  PMI not displaced or sustained,S1 and S2 within normal limits, no S3, no S4, no clicks, no rubs, no murmurs ABD:  Flat, positive bowel sounds normal in frequency in pitch, no bruits, no rebound, no guarding, no midline pulsatile mass, no hepatomegaly, no splenomegaly EXT:  2 plus pulses throughout, 2+ LE edema, no cyanosis no clubbing SKIN:  No rashes no nodules NEURO:  Cranial nerves II through XII grossly intact, motor grossly intact throughout PSYCH:  Cognitively intact, oriented to person place and time   Labs    High Sensitivity Troponin:   Recent Labs  Lab 02/05/23 0537 02/05/23 1430 02/06/23 0711 03/02/23 1117 03/02/23 1310  TROPONINIHS 91* 633* 735* 24* 24*     Chemistry Recent Labs  Lab 03/02/23 1117 03/03/23 0352 03/04/23 0414  NA 138 142 138  K 2.6* 2.8* 3.0*  CL 96* 97*  94*  CO2 30 32 33*  GLUCOSE 115* 106* 110*  BUN 16 14 13   CREATININE 1.33* 1.23* 1.21*  CALCIUM 8.8* 8.6* 8.6*  MG 1.6*  --  2.3  PROT 6.8 6.2*  --   ALBUMIN 3.5 3.2*  --   AST 19 19  --   ALT 14 13  --   ALKPHOS 52 48  --   BILITOT 0.6 0.7  --   GFRNONAA 40* 44* 44*  ANIONGAP 12 13 11     Lipids No results for input(s): "CHOL", "TRIG", "HDL", "LABVLDL", "LDLCALC", "CHOLHDL" in the last 168 hours.  Hematology Recent Labs  Lab 03/03/23 0352 03/04/23 0414 03/04/23 1401  WBC 8.7 8.5 9.5  RBC 4.20 4.15 4.14  HGB 9.0* 8.6* 8.8*  HCT 30.0* 29.8* 30.4*  MCV 71.4* 71.8* 73.4*  MCH 21.4* 20.7* 21.3*  MCHC 30.0 28.9* 28.9*  RDW 17.8* 17.9* 17.7*  PLT 186 195 221   Thyroid No results for input(s): "TSH", "FREET4" in the last 168 hours.  BNP Recent Labs  Lab 03/02/23 1117  BNP 671.2*    DDimer No results for input(s): "DDIMER" in the last 168 hours.    Radiology    No results found.  Cardiac Studies   Echo 02/05/23: 1. Left ventricular ejection fraction, by estimation, is 55 to 60%. The  left ventricle has normal function. The left ventricle has no regional  wall motion abnormalities. Left ventricular diastolic parameters are  indeterminate.   2. Right ventricular systolic function is normal. The right ventricular  size is normal. There is normal pulmonary artery systolic pressure.   3. Left atrial size was mildly dilated.   4. The mitral valve is degenerative. Mild mitral valve regurgitation. No  evidence of mitral stenosis.   5. Partial fusion of right and left cusps. The aortic valve is tricuspid.  There is moderate calcification of the aortic valve. There is moderate  thickening of the aortic valve. Aortic valve regurgitation is mild.  Moderate aortic valve stenosis.   6. The inferior vena cava is normal in size with greater than 50%  respiratory variability, suggesting right atrial pressure of 3 mmHg.   Assessment & Plan    Kara Bush is an 62F with CAD s/p CABG and PCI, HFpEF, mild ascending aorta aneurysm, PAF, PAD s/p bypass, HTN, HTL, CKD 3a, OSA, and CVA admitted with acute on chronic diastolic HF:   # HFpEF:  In the setting of dietary indiscretion.  Symptomatically improved after significant diuresis but remains very volume overloaded.  Will order TED hose.  Continue diuresis with IV lasix.  Increase to 80mg  IV bid.  Suggest daily dosing at discharge.  Continue coaching on sodium and fluid restriction.  She states "I'd rather die than eat that heart healthy stuff."  Start spironolactone 25mg  daily and aggressively supplement potassium.  # Hypertensive urgency:  BP improving but still elevated.  Continue metoprolol.  Resume Entresto 24/26mg  bid. Continue hydralazine and Imdur.  Add spironolactone as above.  Monitor renal function.  # Demand ischemia:  No plans for ischemic evaluation at this time.  Currently chest  pain free.   # CAD:  # PAD:  # HL:  Continue aspirin and statin.   # PAF:  Eliquis and metoprolol.       For questions or updates, please contact [facility] Please consult [email or web address] for contact info under        Signed, Chilton Si, MD  03/05/2023, 10:22 AM

## 2023-03-05 NOTE — Progress Notes (Addendum)
PROGRESS NOTE MIAMI MERCHAN  ZOX:[id number] DOB: 1941-06-10 DOA: 03/02/2023 PCP: Jackelyn Poling, DO  Brief Narrative/Hospital Course: 82 y.o. female with PMH significant for HTN, HLD, obesity, OSA, CAD/CABG/stent, PAD, AAA s/p resection, PE on Eliquis, CHF on only as needed home oxygen, chronic pain syndrome, fibromyalgia, ulcerative colitis April 2024, patient was hospitalized for acute blood loss anemia with a hemoglobin of 6, received several units of PRBCs.  Colonoscopy 4/23 showed mild ulcerative colitis and a single nonbleeding colon lesion.  Eliquis was resumed at discharge at a reduced dose. Recently hospitalized 7/3 to 7/13 for acute CVA, AKI, intractable nausea, vomiting, weight loss -managed conservatively with continues on Eliquis, IV hydration, medication adjustment.  Because of AKI, Lasix/Entresto was held at discharge.  At home, patient noticed worsening pedal edema and self started Lasix to take every few days.   7/23, followed up at the cardiologist office.  Blood pressure was noted to be elevated.  Hydralazine dose was increased.  Metoprolol, isosorbide and Lasix were continued   7/29, patient was brought to the ED by EMS with complaint of progressively worsening pedal edema, shortness of breath, orthopnea and has been using her as needed home oxygen continuously at 4 L/min.   In the ED patient afebrile, heart rate in 80s, blood pressure 204/82, required 3 to 4 L oxygen to keep O2 sat more than 90% Initial labs with VBG showing alkalotic pH of 7.62, pCO2 39, bicarb 40 CBC with hemoglobin 9, BMP with potassium low at 2.6, creatinine 1.33, magnesium 1.6, BNP 671, troponin 24 Respiratory virus panel unremarkable,Chest x-ray unremarkable Patient was given IV hydralazine, IV Lasix, potassium and magnesium supplement and admitted to [facility]   Subjective: Patient seen and examined Resting well on RA  Past 24 hours patient has been afebrile BP 120s to 140s systolic, saturation in mid to  high 90s Labs shows hypokalemia 3.0 from 2.8 bicarb 33 creatinine 1.2, anemic with hemoglobin 8.8 UOP 1925 cc past 24 hours Legs still swollen Sat on bedside chair yesterday and had some back pain.  Assessment and Plan: Principal Problem:   Acute on chronic diastolic congestive heart failure (HCC) Active Problems:   Hyperlipidemia   Depression   CAD (coronary artery disease), CABG 1998, STENTS MULTIPLE SITES SINCE.    Obstructive sleep apnea   Paroxysmal atrial fibrillation (HCC)   CKD (chronic kidney disease) stage 3, GFR 30-59 ml/min (HCC)   Elevated troponin   Hypokalemia   Hypomagnesemia   Acute on chronic systolic CHF (congestive heart failure) (HCC)  HFpEF, acute on chronic Leg edema Hypertension: In the setting of dietary indiscretion, with ongoing diuresis symptomatically improving, has had significant diuresis but is still volume overloaded, cardiology following closely, ordering TED hose. Last echo 02/05/2023 with EF 55 to 60%, moderate aortic valve stenosis Continue on IV Lasix increasing to 80 mg twice daily 8/1, continue to educate sodium and fluid restriction at home.  Optimize antihypertensive, monitor intake output electrolytes, GDMT as per cardiology-Prior to admission, patient was also on toprol 75 mg daily, hydralazine 50 mg 3 times daily.  Added hydralazine 25 tid and aldactone 25 daily 8/1, continue Imdur 30 daily, Toprol XL 25 daily, Entresto twice daily. Net IO Since Admission: -5,715 mL [03/05/23 1301]  Filed Weights   03/02/23 1740 03/04/23 0706 03/05/23 0421  Weight: 86.5 kg 84.1 kg 84 kg    Recent Labs  Lab 03/02/23 1117 03/03/23 0352 03/04/23 0414 03/05/23 1150  BNP 671.2*  --   --   --  BUN 16 14 13 20   CREATININE 1.33* 1.23* 1.21* 1.41*  K 2.6* 2.8* 3.0* 3.1*  MG 1.6*  --  2.3  --    Hypokalemia/hypomagnesemia: Repleting electrolytes. Add kdur daily as well.  CKD 3B: Creatinine at baseline as below and 1.2-1.3 range> recheck lab creatinine  1.4> but given fluid overload keep on current IV Lasix Recent Labs    02/09/23 0337 02/10/23 0429 02/11/23 0444 02/12/23 0424 02/13/23 0349 02/14/23 0402 03/02/23 1117 03/03/23 0352 03/04/23 0414 03/05/23 1150  BUN 18 15 16  24* 30* 29* 16 14 13 20   CREATININE 1.10* 1.09* 1.43* 1.98* 2.42* 1.72* 1.33* 1.23* 1.21* 1.41*  CO2 30 29 28 26 26 27 30  32 33* 34*    PAF: Rate controlled continue Toprol, Eliquis. During the hospitalization in April, Eliquis dose was reduced from 5 mg twice daily to 2.5 mg twice daily because of elevated risk of bleeding.Given her high CHA2DS2-VASc score of more than 6, and a stable hemoglobin currently does increase TO 5 MG BID after discussing with cardiology   Elevated troponin H/o CAD/CABG/stent H/o PAD, AAA s/p resection HLD: Mildly elevated troponin.  Likely demand ischemia in the setting of CHF exacerbation Continue metoprolol, Lipitor.Per cardiology, no plan of ischemic evaluation at this time. Recent Labs    03/02/23 1310  TROPONINIHS 24*   H/o PE: Cont Eliquis  Chronic microcytic anemia Chronic iron deficiency H/o ulcerative colitis April 2024, patient was hospitalized for acute blood loss anemia with a hemoglobin of 6, received several units of PRBCs.  Colonoscopy 4/23 showed mild ulcerative colitis and a single nonbleeding colon lesion.  Hemoglobin at baseline close to 9.  Continue to trend as below. Continue Protonix. Recent Labs    11/24/22 0225 11/24/22 1802 11/25/22 0301 02/06/23 0259 02/07/23 0302 02/12/23 0424 03/02/23 1117 03/03/23 0352 03/04/23 0414 03/04/23 1401  HGB 7.4* 8.9*   < > 10.8*   < > 9.6* 9.0* 9.0* 8.6* 8.8*  MCV 68.6*  --    < > 73.1*   < > 73.9* 70.5* 71.4* 71.8* 73.4*  VITAMINB12  --  381  --  370  --   --   --   --   --   --   FOLATE >40.0  --   --  33.6  --   --   --   --   --   --   FERRITIN  --  7*  --  13  --   --   --   --   --   --   TIBC  --  479*  --  438  --   --   --   --   --   --   IRON  --   73  --  76  --   --   --   --   --   --   RETICCTPCT 1.8  --   --  1.5  --   --   --   --   --   --    < > = values in this interval not displayed.   OSA Not been using CPAP recently due to device malfunction Hospital CPAP to try.  Depression Continue Lexapro, trazodone  Class I Obesity:Patient's Body mass index is 32.29 kg/m. : Will benefit with PCP follow-up, weight loss  healthy lifestyle and outpatient sleep evaluation.   DVT prophylaxis: Place TED hose Start: 03/05/23 1025Eliquis Code Status:   Code Status: DNR Family Communication: plan  of care discussed with patient/daughter Selena Batten on phone updated Patient status is: Inpatient because of CHF exacerbation Level of care: Progressive   Dispo: The patient is from: Home, Mobilizing independently with tech            Anticipated disposition: Pending clinical course  Objective: Vitals last 24 hrs: Vitals:   03/04/23 1330 03/04/23 2012 03/05/23 0421 03/05/23 1233  BP: 120/83 (!) 128/51 (!) 146/61 (!) 153/61  Pulse: 77 71 81 86  Resp: 16 18 18 18   Temp: 98.2 F (36.8 C) 98.1 F (36.7 C) 98.7 F (37.1 C) 98.3 F (36.8 C)  TempSrc: Oral Oral Oral Oral  SpO2: 100% 92% 93% 98%  Weight:   84 kg   Height:       Weight change:   Physical Examination: General exam: alert awake,older than stated age HEENT:Oral mucosa moist, Ear/Nose WNL grossly Respiratory system: bilaterally clear BS,no use of accessory muscle Cardiovascular system: S1 & S2 +, No JVD. Gastrointestinal system: Abdomen soft,NT,ND, BS+ Nervous System:Alert, awake, moving extremities. Extremities:LE edema ++, distal peripheral pulses palpable.  Skin:No rashes,no icterus. JWJ:XBJYNW muscle bulk,tone, power  Medications reviewed:  Scheduled Meds:  apixaban  5 mg Oral BID   atorvastatin  40 mg Oral Daily   citalopram  20 mg Oral Daily   furosemide  80 mg Intravenous BID   hydrALAZINE  25 mg Oral TID   isosorbide mononitrate  30 mg Oral Daily   metoprolol  succinate  75 mg Oral Daily   pantoprazole  40 mg Oral BID   sacubitril-valsartan  1 tablet Oral BID   spironolactone  25 mg Oral Daily   Continuous Infusions:    Diet Order             Diet Heart Room service appropriate? Yes; Fluid consistency: Thin; Fluid restriction: 1200 mL Fluid  Diet effective now                   Intake/Output Summary (Last 24 hours) at 03/05/2023 1301 Last data filed at 03/05/2023 0900 Gross per 24 hour  Intake 960 ml  Output 875 ml  Net 85 ml   Net IO Since Admission: -5,715 mL [03/05/23 1301]  Wt Readings from Last 3 Encounters:  03/05/23 84 kg  02/25/23 87.9 kg  02/24/23 88.5 kg     Unresulted Labs (From admission, onward)     Start     Ordered   03/06/23 0500  Basic metabolic panel  Daily,   R      03/05/23 1101   03/06/23 0500  CBC  Daily,   R      03/05/23 1101   03/06/23 0500  Magnesium  Tomorrow morning,   R        03/05/23 1101          Data Reviewed: I have personally reviewed following labs and imaging studies CBC: Recent Labs  Lab 03/02/23 1117 03/03/23 0352 03/04/23 0414 03/04/23 1401  WBC 8.4 8.7 8.5 9.5  NEUTROABS 6.1  --  6.0  --   HGB 9.0* 9.0* 8.6* 8.8*  HCT 30.1* 30.0* 29.8* 30.4*  MCV 70.5* 71.4* 71.8* 73.4*  PLT 213 186 195 221   Basic Metabolic Panel: Recent Labs  Lab 03/02/23 1117 03/03/23 0352 03/04/23 0414 03/05/23 1150  NA 138 142 138 138  K 2.6* 2.8* 3.0* 3.1*  CL 96* 97* 94* 94*  CO2 30 32 33* 34*  GLUCOSE 115* 106* 110* 111*  BUN 16 14 13  20  CREATININE 1.33* 1.23* 1.21* 1.41*  CALCIUM 8.8* 8.6* 8.6* 8.3*  MG 1.6*  --  2.3  --   PHOS  --   --  3.1  --   GFR: Estimated Creatinine Clearance: 31.4 mL/min (A) (by C-G formula based on SCr of 1.41 mg/dL (H)). Liver Function Tests: Recent Labs  Lab 03/02/23 1117 03/03/23 0352  AST 19 19  ALT 14 13  ALKPHOS 52 48  BILITOT 0.6 0.7  PROT 6.8 6.2*  ALBUMIN 3.5 3.2*    CBG: Recent Labs  Lab 03/03/23 2053  GLUCAP 159*   Recent  Results (from the past 240 hour(s))  Resp panel by RT-PCR (RSV, Flu A&B, Covid) Anterior Nasal Swab     Status: None   Collection Time: 03/02/23 11:17 AM   Specimen: Anterior Nasal Swab  Result Value Ref Range Status   SARS Coronavirus 2 by RT PCR NEGATIVE NEGATIVE Final    Comment: (NOTE) SARS-CoV-2 target nucleic acids are NOT DETECTED.  The SARS-CoV-2 RNA is generally detectable in upper respiratory specimens during the acute phase of infection. The lowest concentration of SARS-CoV-2 viral copies this assay can detect is 138 copies/mL. A negative result does not preclude SARS-Cov-2 infection and should not be used as the sole basis for treatment or other patient management decisions. A negative result may occur with  improper specimen collection/handling, submission of specimen other than nasopharyngeal swab, presence of viral mutation(s) within the areas targeted by this assay, and inadequate number of viral copies(<138 copies/mL). A negative result must be combined with clinical observations, patient history, and epidemiological information. The expected result is Negative.  Fact Sheet for Patients:  BloggerCourse.com  Fact Sheet for Healthcare Providers:  SeriousBroker.it  This test is no t yet approved or cleared by the [place] FDA and  has been authorized for detection and/or diagnosis of SARS-CoV-2 by FDA under an Emergency Use Authorization (EUA). This EUA will remain  in effect (meaning this test can be used) for the duration of the COVID-19 declaration under Section 564(b)(1) of the Act, 21 U.S.C.section 360bbb-3(b)(1), unless the authorization is terminated  or revoked sooner.       Influenza A by PCR NEGATIVE NEGATIVE Final   Influenza B by PCR NEGATIVE NEGATIVE Final    Comment: (NOTE) The Xpert Xpress SARS-CoV-2/FLU/RSV plus assay is intended as an aid in the diagnosis of influenza from Nasopharyngeal  swab specimens and should not be used as a sole basis for treatment. Nasal washings and aspirates are unacceptable for Xpert Xpress SARS-CoV-2/FLU/RSV testing.  Fact Sheet for Patients: BloggerCourse.com  Fact Sheet for Healthcare Providers: SeriousBroker.it  This test is not yet approved or cleared by the [place] FDA and has been authorized for detection and/or diagnosis of SARS-CoV-2 by FDA under an Emergency Use Authorization (EUA). This EUA will remain in effect (meaning this test can be used) for the duration of the COVID-19 declaration under Section 564(b)(1) of the Act, 21 U.S.C. section 360bbb-3(b)(1), unless the authorization is terminated or revoked.     Resp Syncytial Virus by PCR NEGATIVE NEGATIVE Final    Comment: (NOTE) Fact Sheet for Patients: BloggerCourse.com  Fact Sheet for Healthcare Providers: SeriousBroker.it  This test is not yet approved or cleared by the [place] FDA and has been authorized for detection and/or diagnosis of SARS-CoV-2 by FDA under an Emergency Use Authorization (EUA). This EUA will remain in effect (meaning this test can be used) for the duration of the COVID-19 declaration under Section  564(b)(1) of the Act, 21 U.S.C. section 360bbb-3(b)(1), unless the authorization is terminated or revoked.  Performed at [facility], [address] [place], [place] [address]     Antimicrobials: Anti-infectives (From admission, onward)    None      Culture/Microbiology    Component Value Date/Time   SDES  02/13/2023 0835    URINE, CLEAN CATCH Performed at [facility], [address], [place], [place] [address]    SPECREQUEST  02/13/2023 5643    NONE Performed at [facility], [address], [place], [place] [address]    CULT MULTIPLE SPECIES PRESENT, SUGGEST  RECOLLECTION (A) 02/13/2023 0835   REPTSTATUS 02/14/2023 FINAL 02/13/2023 0835    Radiology Studies: No results found.   LOS: 2 days   Lanae Boast, MD [facility]  03/05/2023, 1:01 PM

## 2023-03-05 NOTE — Progress Notes (Signed)
   03/05/23 2136  BiPAP/CPAP/SIPAP  $ Face Mask Large  Yes  BiPAP/CPAP/SIPAP Pt Type Adult (prefers self placement)  BiPAP/CPAP/SIPAP DREAMSTATIOND  Mask Type Full face mask  Mask Size Large  Flow Rate 2 lpm  Patient Home Equipment No  Auto Titrate Yes (5-20)  CPAP/SIPAP surface wiped down Yes  BiPAP/CPAP /SiPAP Vitals  Pulse Rate 85  Resp 20  SpO2 94 %  Bilateral Breath Sounds Clear;Diminished  MEWS Score/Color  MEWS Score 0  MEWS Score Color [place] Si

## 2023-03-05 NOTE — Plan of Care (Deleted)
Patient is progressing but need to keep working on fluid restriction and pain management

## 2023-03-05 NOTE — Hospital Course (Signed)
82 y.o. female with PMH significant for HTN, HLD, obesity, OSA, CAD/CABG/stent, PAD, AAA s/p resection, PE on Eliquis, CHF on only as needed home oxygen, chronic pain syndrome, fibromyalgia, ulcerative colitis April 2024, patient was hospitalized for acute blood loss anemia with a hemoglobin of 6, received several units of PRBCs.  Colonoscopy 4/23 showed mild ulcerative colitis and a single nonbleeding colon lesion.  Eliquis was resumed at discharge at a reduced dose. Recently hospitalized 7/3 to 7/13 for acute CVA, AKI, intractable nausea, vomiting, weight loss -managed conservatively with continues on Eliquis, IV hydration, medication adjustment.  Because of AKI, Lasix/Entresto was held at discharge.  At home, patient noticed worsening pedal edema and self started Lasix to take every few days.   7/23, followed up at the cardiologist office.  Blood pressure was noted to be elevated.  Hydralazine dose was increased.  Metoprolol, isosorbide and Lasix were continued   7/29, patient was brought to the ED by EMS with complaint of progressively worsening pedal edema, shortness of breath, orthopnea and has been using her as needed home oxygen continuously at 4 L/min.   In the ED patient afebrile, heart rate in 80s, blood pressure 204/82, required 3 to 4 L oxygen to keep O2 sat more than 90% Initial labs with VBG showing alkalotic pH of 7.62, pCO2 39, bicarb 40 CBC with hemoglobin 9, BMP with potassium low at 2.6, creatinine 1.33, magnesium 1.6, BNP 671, troponin 24 Respiratory virus panel unremarkable,Chest x-ray unremarkable Patient was given IV hydralazine, IV Lasix, potassium and magnesium supplement and admitted to [facility]

## 2023-03-05 NOTE — Consult Note (Signed)
   [facility] Inpatient Consult   03/05/2023  Kara Bush September 08, 1940 [id number]  [facility] [[facility]]  Accountable Care Organization [ACO] Patient: [employer]   [facility] L[facility]ison remote coverage review for patient admitted to [facility]    Primary Care Provider:  Jackelyn Poling, DO with Deboraha Sprang at [facility] is listed as PCP  Patient screened for hospitalization with noted high risk score for unplanned readmission risk with a 3 day length of stay and to assess for potential [facility]  [[facility]] Care Management service needs for post hospital transition for care coordination.  Review of patient's electronic medical record reveals patient is on HF respiratory issues.   Plan:  Continue to follow progress and disposition to assess for post hospital community care coordination/management needs.  Referral request for community care coordination: would be with [facility] provider and team. Patient shows engaged with [facility] in [place]/Ping for transition of care follow up  Of note, [facility] Care Management/Population Health does not replace or interfere with any arrangements made by the Inpatient Transition of Care team.  For questions contact:   Charlesetta Shanks, RN BSN CCM [facility]  [phone] business mobile phone Toll free office [phone]  *Concierge Line  [phone] Fax number: [phone] [email or web address]

## 2023-03-05 NOTE — Progress Notes (Signed)
Physical Therapy Treatment Patient Details Name: Kara Bush MRN: [id number] DOB: Jan 10, 1941 Today's Date: 03/05/2023   History of Present Illness 82 yo female admitted with CHF exac, edema, dyspnea. Hx of obesity, OSA, CAD, CABG, chronic fatigue, chronic pain, fibromyalgia, falls, PE, AAA, UC, CHF, CVA    PT Comments  Pt tolerated increased ambulation distance of 120' without an assistive device, no loss of balance, SpO2 90% on room air walking. Distance limited by back pain.      If plan is discharge home, recommend the following: Assistance with cooking/housework;Assist for transportation;Help with stairs or ramp for entrance   Can travel by private vehicle        Equipment Recommendations  None recommended by PT    Recommendations for Other Services       Precautions / Restrictions Precautions Precautions: Sternal Precaution Comments: monitor O2 Restrictions Weight Bearing Restrictions: No     Mobility  Bed Mobility Overal bed mobility: Modified Independent Bed Mobility: Supine to Sit                Transfers Overall transfer level: Independent Equipment used: None Transfers: Sit to/from Stand Sit to Stand: Independent                Ambulation/Gait Ambulation/Gait assistance: Supervision Gait Distance (Feet): 120 Feet Assistive device: None Gait Pattern/deviations: Step-through pattern, Decreased stride length Gait velocity: WFL     General Gait Details: Spo2 90% on room air walking, distance limited by back pain   Stairs             Wheelchair Mobility     Tilt Bed    Modified Rankin (Stroke Patients Only)       Balance Overall balance assessment: Mild deficits observed, not formally tested Sitting-balance support: Feet unsupported, No upper extremity supported Sitting balance-Leahy Scale: Good     Standing balance support: No upper extremity supported, Single extremity supported Standing balance-Leahy Scale:  Good Standing balance comment: RW to ambulate ; single UE support with balance challenges                            Cognition Arousal/Alertness: Awake/alert Behavior During Therapy: WFL for tasks assessed/performed Overall Cognitive Status: Within Functional Limits for tasks assessed                                          Exercises      General Comments        Pertinent Vitals/Pain Pain Assessment Pain Score: 7  Pain Location: back Pain Descriptors / Indicators: Discomfort, Sore Pain Intervention(s): Limited activity within patient's tolerance, Monitored during session, RN gave pain meds during session    Home Living                          Prior Function            PT Goals (current goals can now be found in the care plan section) Acute Rehab PT Goals Patient Stated Goal: to be able to do stairs PT Goal Formulation: With patient Time For Goal Achievement: 02/21/23 Potential to Achieve Goals: Good Progress towards PT goals: Progressing toward goals    Frequency    Min 1X/week      PT Plan Current plan remains appropriate    Co-evaluation  AM-PAC PT "6 Clicks" Mobility   Outcome Measure  Help needed turning from your back to your side while in a flat bed without using bedrails?: None Help needed moving from lying on your back to sitting on the side of a flat bed without using bedrails?: None Help needed moving to and from a bed to a chair (including a wheelchair)?: None Help needed standing up from a chair using your arms (e.g., wheelchair or bedside chair)?: None Help needed to walk in hospital room?: None Help needed climbing 3-5 steps with a railing? : A Little 6 Click Score: 23    End of Session Equipment Utilized During Treatment: Gait belt Activity Tolerance: Patient limited by pain Patient left: in chair;with call bell/phone within reach Nurse Communication: Mobility status PT Visit  Diagnosis: Difficulty in walking, not elsewhere classified (R26.2);Pain     Time: 6962-9528 PT Time Calculation (min) (ACUTE ONLY): 11 min  Charges:    $Gait Training: 8-22 mins PT General Charges $$ ACUTE PT VISIT: 1 Visit                     Tamala Ser PT 03/05/2023  Acute Rehabilitation Services  Office [phone]

## 2023-03-06 DIAGNOSIS — E785 Hyperlipidemia, unspecified: Secondary | ICD-10-CM | POA: Diagnosis not present

## 2023-03-06 DIAGNOSIS — I480 Paroxysmal atrial fibrillation: Secondary | ICD-10-CM | POA: Diagnosis not present

## 2023-03-06 DIAGNOSIS — G4733 Obstructive sleep apnea (adult) (pediatric): Secondary | ICD-10-CM

## 2023-03-06 DIAGNOSIS — E876 Hypokalemia: Secondary | ICD-10-CM

## 2023-03-06 DIAGNOSIS — N1832 Chronic kidney disease, stage 3b: Secondary | ICD-10-CM | POA: Diagnosis not present

## 2023-03-06 DIAGNOSIS — I5033 Acute on chronic diastolic (congestive) heart failure: Secondary | ICD-10-CM | POA: Diagnosis not present

## 2023-03-06 MED ORDER — POLYVINYL ALCOHOL 1.4 % OP SOLN
1.0000 [drp] | OPHTHALMIC | Status: DC | PRN
  Administered 2023-03-06: 1 [drp] via OPHTHALMIC
  Filled 2023-03-06: qty 15

## 2023-03-06 MED ORDER — MAGNESIUM OXIDE -MG SUPPLEMENT 400 (240 MG) MG PO TABS
400.0000 mg | ORAL_TABLET | Freq: Two times a day (BID) | ORAL | Status: DC
  Administered 2023-03-06 – 2023-03-07 (×3): 400 mg via ORAL
  Filled 2023-03-06 (×3): qty 1

## 2023-03-06 NOTE — Progress Notes (Signed)
Rounding Note    Patient Name: Kara Bush Date of Encounter: 03/06/2023  [facility] Cardiologist: Kara Nose, MD   Subjective   Better.  Notes that her breathing is back to baseline.  Inpatient Medications    Scheduled Meds:  apixaban  5 mg Oral BID   atorvastatin  40 mg Oral Daily   citalopram  20 mg Oral Daily   hydrALAZINE  25 mg Oral TID   isosorbide mononitrate  30 mg Oral Daily   metoprolol succinate  75 mg Oral Daily   pantoprazole  40 mg Oral BID   potassium chloride  20 mEq Oral BID   sacubitril-valsartan  1 tablet Oral BID   spironolactone  25 mg Oral Daily   Continuous Infusions:  PRN Meds: acetaminophen **OR** acetaminophen, hydrALAZINE, HYDROcodone-acetaminophen, ipratropium-albuterol, morphine injection, ondansetron **OR** ondansetron (ZOFRAN) IV, mouth rinse, traZODone   Vital Signs    Vitals:   03/05/23 2136 03/06/23 0500 03/06/23 0515 03/06/23 0922  BP:   (!) 151/44 132/75  Pulse: 85  90 90  Resp: 20  (!) 22   Temp:   98.1 F (36.7 C)   TempSrc:   Oral   SpO2: 94%  90%   Weight:  84.2 kg    Height:        Intake/Output Summary (Last 24 hours) at 03/06/2023 0945 Last data filed at 03/06/2023 0300 Gross per 24 hour  Intake 360 ml  Output 1800 ml  Net -1440 ml      03/06/2023    5:00 AM 03/05/2023    4:21 AM 03/04/2023    7:06 AM  Last 3 Weights  Weight (lbs) 185 lb 10 oz 185 lb 3 oz 185 lb 8 oz  Weight (kg) 84.2 kg 84 kg 84.142 kg      Telemetry    Atrial fibrillation.  Rate <100 BPM.  - Personally Reviewed  ECG    N/A - Personally Reviewed  Physical Exam   VS:  BP 132/75   Pulse 90   Temp 98.1 F (36.7 C) (Oral)   Resp (!) 22   Ht 5' 3.5" (1.613 m)   Wt 84.2 kg   SpO2 90%   BMI 32.37 kg/m  , BMI Body mass index is 32.37 kg/m. GENERAL:  Well appearing HEENT: Pupils equal round and reactive, fundi not visualized, oral mucosa unremarkable NECK:  No jugular venous distention, waveform within normal  limits, carotid upstroke brisk and symmetric, no bruits, no thyromegaly LUNGS: Clear to auscultation bilaterally HEART:  Irregularly irregular.  PMI not displaced or sustained,S1 and S2 within normal limits, no S3, no S4, no clicks, no rubs, no murmurs ABD:  Flat, positive bowel sounds normal in frequency in pitch, no bruits, no rebound, no guarding, no midline pulsatile mass, no hepatomegaly, no splenomegaly EXT:  2 plus pulses throughout, 1+ LE edema, no cyanosis no clubbing SKIN:  No rashes no nodules NEURO:  Cranial nerves II through XII grossly intact, motor grossly intact throughout PSYCH:  Cognitively intact, oriented to person place and time   Labs    High Sensitivity Troponin:   Recent Labs  Lab 02/05/23 0537 02/05/23 1430 02/06/23 0711 03/02/23 1117 03/02/23 1310  TROPONINIHS 91* 633* 735* 24* 24*     Chemistry Recent Labs  Lab 03/02/23 1117 03/03/23 0352 03/04/23 0414 03/05/23 1150 03/06/23 0357  NA 138 142 138 138 140  K 2.6* 2.8* 3.0* 3.1* 3.4*  CL 96* 97* 94* 94* 97*  CO2 30  32 33* 34* 34*  GLUCOSE 115* 106* 110* 111* 100*  BUN 16 14 13 20 23   CREATININE 1.33* 1.23* 1.21* 1.41* 1.63*  CALCIUM 8.8* 8.6* 8.6* 8.3* 8.4*  MG 1.6*  --  2.3  --  1.5*  PROT 6.8 6.2*  --   --   --   ALBUMIN 3.5 3.2*  --   --   --   AST 19 19  --   --   --   ALT 14 13  --   --   --   ALKPHOS 52 48  --   --   --   BILITOT 0.6 0.7  --   --   --   GFRNONAA 40* 44* 44* 37* 31*  ANIONGAP 12 13 11 10 9     Lipids No results for input(s): "CHOL", "TRIG", "HDL", "LABVLDL", "LDLCALC", "CHOLHDL" in the last 168 hours.  Hematology Recent Labs  Lab 03/04/23 0414 03/04/23 1401 03/06/23 0357  WBC 8.5 9.5 10.3  RBC 4.15 4.14 4.28  HGB 8.6* 8.8* 8.9*  HCT 29.8* 30.4* 30.9*  MCV 71.8* 73.4* 72.2*  MCH 20.7* 21.3* 20.8*  MCHC 28.9* 28.9* 28.8*  RDW 17.9* 17.7* 17.9*  PLT 195 221 202   Thyroid No results for input(s): "TSH", "FREET4" in the last 168 hours.  BNP Recent Labs  Lab  03/02/23 1117  BNP 671.2*    DDimer No results for input(s): "DDIMER" in the last 168 hours.   Radiology    No results found.  Cardiac Studies   Echo 02/05/23: 1. Left ventricular ejection fraction, by estimation, is 55 to 60%. The  left ventricle has normal function. The left ventricle has no regional  wall motion abnormalities. Left ventricular diastolic parameters are  indeterminate.   2. Right ventricular systolic function is normal. The right ventricular  size is normal. There is normal pulmonary artery systolic pressure.   3. Left atrial size was mildly dilated.   4. The mitral valve is degenerative. Mild mitral valve regurgitation. No  evidence of mitral stenosis.   5. Partial fusion of right and left cusps. The aortic valve is tricuspid.  There is moderate calcification of the aortic valve. There is moderate  thickening of the aortic valve. Aortic valve regurgitation is mild.  Moderate aortic valve stenosis.   6. The inferior vena cava is normal in size with greater than 50%  respiratory variability, suggesting right atrial pressure of 3 mmHg.   Assessment & Plan    Kara Bush is an 21F with CAD s/p CABG and PCI, HFpEF, mild ascending aorta aneurysm, PAF, PAD s/p bypass, HTN, HTL, CKD 3a, OSA, and CVA admitted with acute on chronic diastolic HF:   # HFpEF:  In the setting of dietary indiscretion.  Volume status improving.  Renal function is worsening.  Her lungs now sound clear.  She does continue to have some lower extremity edema.  Encouraged her to wear compression socks.  We will hold her Lasix.  Plan to start Lasix 40 mg daily tomorrow if renal function is improving.  She was taking it as needed at home.  Continue coaching on sodium and fluid restriction.  She states "I'd rather die than eat that heart healthy stuff."  Start spironolactone 25mg  daily and aggressively supplement potassium.  # Hypertensive urgency:  BP improving but still elevated.  Continue metoprolol  and Entresto 24/26mg  bid. Continue hydralazine and Imdur.  Spironolactone was added due to hypokalemia.  Continue to monitor renal function and holding  diuresis as above.  # Demand ischemia:  No plans for ischemic evaluation at this time.  Currently chest pain free.   # CAD:  # PAD:  # HL:  Continue aspirin and statin.   # PAF:  Eliquis and metoprolol.       For questions or updates, please contact [facility] Please consult [email or web address] for contact info under        Signed, Chilton Si, MD  03/06/2023, 9:45 AM

## 2023-03-06 NOTE — Plan of Care (Signed)
  Problem: Education: Goal: Knowledge of General Education information will improve Description Including pain rating scale, medication(s)/side effects and non-pharmacologic comfort measures Outcome: Progressing   Problem: Health Behavior/Discharge Planning: Goal: Ability to manage health-related needs will improve Outcome: Progressing   

## 2023-03-06 NOTE — Plan of Care (Signed)
  Problem: Education: Goal: Ability to demonstrate management of disease process will improve Outcome: Progressing Goal: Ability to verbalize understanding of medication therapies will improve Outcome: Progressing   Problem: Activity: Goal: Capacity to carry out activities will improve Outcome: Progressing   Problem: Cardiac: Goal: Ability to achieve and maintain adequate cardiopulmonary perfusion will improve Outcome: Progressing   

## 2023-03-06 NOTE — Plan of Care (Signed)
  Problem: Education: Goal: Ability to demonstrate management of disease process will improve Outcome: Progressing Goal: Ability to verbalize understanding of medication therapies will improve Outcome: Progressing   Problem: Activity: Goal: Capacity to carry out activities will improve Outcome: Progressing   Problem: Education: Goal: Knowledge of General Education information will improve Description: Including pain rating scale, medication(s)/side effects and non-pharmacologic comfort measures Outcome: Progressing   Problem: Health Behavior/Discharge Planning: Goal: Ability to manage health-related needs will improve Outcome: Progressing   Problem: Cardiac: Goal: Ability to achieve and maintain adequate cardiopulmonary perfusion will improve Outcome: Not Progressing

## 2023-03-06 NOTE — Progress Notes (Signed)
PROGRESS NOTE Kara Bush  MVH:[id number] DOB: 1941-02-19 DOA: 03/02/2023 PCP: Jackelyn Poling, DO  Brief Narrative/Hospital Course: 82 y.o. female with PMH significant for HTN, HLD, obesity, OSA, CAD/CABG/stent, PAD, AAA s/p resection, PE on Eliquis, CHF on only as needed home oxygen, chronic pain syndrome, fibromyalgia, ulcerative colitis April 2024, patient was hospitalized for acute blood loss anemia with a hemoglobin of 6, received several units of PRBCs.  Colonoscopy 4/23 showed mild ulcerative colitis and a single nonbleeding colon lesion.  Eliquis was resumed at discharge at a reduced dose. Recently hospitalized 7/3 to 7/13 for acute CVA, AKI, intractable nausea, vomiting, weight loss -managed conservatively with continues on Eliquis, IV hydration, medication adjustment.  Because of AKI, Lasix/Entresto was held at discharge.  At home, patient noticed worsening pedal edema and self started Lasix to take every few days.   7/23, followed up at the cardiologist office.  Blood pressure was noted to be elevated.  Hydralazine dose was increased.  Metoprolol, isosorbide and Lasix were continued   7/29, patient was brought to the ED by EMS with complaint of progressively worsening pedal edema, shortness of breath, orthopnea and has been using her as needed home oxygen continuously at 4 L/min.   In the ED patient afebrile, heart rate in 80s, blood pressure 204/82, required 3 to 4 L oxygen to keep O2 sat more than 90% Initial labs with VBG showing alkalotic pH of 7.62, pCO2 39, bicarb 40 CBC with hemoglobin 9, BMP with potassium low at 2.6, creatinine 1.33, magnesium 1.6, BNP 671, troponin 24 Respiratory virus panel unremarkable,Chest x-ray unremarkable Patient was given IV hydralazine, IV Lasix, potassium and magnesium supplement and admitted to [facility]   Subjective: Seen and examined this morning Breathing much better, leg edema improving, on bedside chair Overnight patient has been afebrile BP  in 130s to 150s systolic, used CPAP last night Labs showed improving potassium, currently on K-Dur 20 twice daily supplementation Creatinine slightly up further at 1.6 UOP at 1900 cc despite increasing Lasix  Assessment and Plan: Principal Problem:   Acute on chronic diastolic congestive heart failure (HCC) Active Problems:   Hyperlipidemia   Depression   CAD (coronary artery disease), CABG 1998, STENTS MULTIPLE SITES SINCE.    Obstructive sleep apnea   Paroxysmal atrial fibrillation (HCC)   CKD (chronic kidney disease) stage 3, GFR 30-59 ml/min (HCC)   Elevated troponin   Hypokalemia   Hypomagnesemia   Acute on chronic systolic CHF (congestive heart failure) (HCC)  HFpEF, acute on chronic Leg edema Hypertension: In the setting of dietary indiscretion.Last echo 02/05/2023 with EF 55 to 60%, moderate aortic valve stenosis  Managed w/  IV Lasix increased to 80 mg bid 8/1> creat up at 1.6- holding lasix and if creat better resume at 40 mg daily 8/3 given still has leg edema. Cont compression stocking. Cntinue to educate sodium and fluid restriction at home.  Monitor intake output electrolytes, GDMT as per cardiology-Prior to admission, patient was also on toprol 75 mg daily, hydralazine 50 mg 3 times daily.  Now on Hydralazine 25 tid and aldactone 25 daily added 8/1 2/2 hypokalemia,continue Imdur 30 daily, Toprol XL 25 daily, Entresto twice daily. Net IO Since Admission: -7,155 mL [03/06/23 1139]  Filed Weights   03/04/23 0706 03/05/23 0421 03/06/23 0500  Weight: 84.1 kg 84 kg 84.2 kg    Recent Labs  Lab 03/02/23 1117 03/03/23 0352 03/04/23 0414 03/05/23 1150 03/06/23 0357  BNP 671.2*  --   --   --   --  BUN 16 14 13 20 23   CREATININE 1.33* 1.23* 1.21* 1.41* 1.63*  K 2.6* 2.8* 3.0* 3.1* 3.4*  MG 1.6*  --  2.3  --  1.5*   Hypokalemia/hypomagnesemia: Continue K-Dur 20 twice daily, add Mag-Ox 400 twice daily  AKI on CKD 3B: Creatinine at baseline as below and 1.2-1.3 range>  creatinine slowly uptrending in the setting of CHF and diuresis.  Holding Lasix received Lasix after repeat BMP tomorrow  Recent Labs    02/10/23 0429 02/11/23 0444 02/12/23 0424 02/13/23 0349 02/14/23 0402 03/02/23 1117 03/03/23 0352 03/04/23 0414 03/05/23 1150 03/06/23 0357  BUN 15 16 24* 30* 29* 16 14 13 20 23   CREATININE 1.09* 1.43* 1.98* 2.42* 1.72* 1.33* 1.23* 1.21* 1.41* 1.63*  CO2 29 28 26 26 27 30  32 33* 34* 34*    PAF: Rate controlled continue Toprol, Eliquis. During the hospitalization in April, Eliquis dose was reduced from 5 mg twice daily to 2.5 mg twice daily because of elevated risk of bleeding.Given her high CHA2DS2-VASc score of more than 6, and a stable hemoglobin currently does increase TO 5 MG BID after discussing with cardiology   Elevated troponin H/o CAD/CABG/stent H/o PAD, AAA s/p resection HLD: Mildly elevated troponin.  Likely demand ischemia in the setting of CHF exacerbation Continue metoprolol, Lipitor.Per cardiology, no plan of ischemic evaluation at this time. No results for input(s): "CKTOTAL", "CKMB", "TROPONINIHS", "RELINDX" in the last 72 hours.  H/o PE: Cont Eliquis  Chronic microcytic anemia Chronic iron deficiency H/o ulcerative colitis April 2024, patient was hospitalized for acute blood loss anemia with a hemoglobin of 6, received several units of PRBCs.  Colonoscopy 4/23 showed mild ulcerative colitis and a single nonbleeding colon lesion.  Hemoglobin at baseline ~9.  Continue to trend as below. Continue Protonix. Recent Labs    11/24/22 0225 11/24/22 1802 11/25/22 0301 02/06/23 0259 02/07/23 0302 03/02/23 1117 03/03/23 0352 03/04/23 0414 03/04/23 1401 03/06/23 0357  HGB 7.4* 8.9*   < > 10.8*   < > 9.0* 9.0* 8.6* 8.8* 8.9*  MCV 68.6*  --    < > 73.1*   < > 70.5* 71.4* 71.8* 73.4* 72.2*  VITAMINB12  --  381  --  370  --   --   --   --   --   --   FOLATE >40.0  --   --  33.6  --   --   --   --   --   --   FERRITIN  --  7*  --   13  --   --   --   --   --   --   TIBC  --  479*  --  438  --   --   --   --   --   --   IRON  --  73  --  76  --   --   --   --   --   --   RETICCTPCT 1.8  --   --  1.5  --   --   --   --   --   --    < > = values in this interval not displayed.   OSA Not been using CPAP recently due to device malfunction Hospital CPAP to try.  Depression Continue Lexapro, trazodone  Class I Obesity:Patient's Body mass index is 32.37 kg/m. : Will benefit with PCP follow-up, weight loss  healthy lifestyle and outpatient sleep evaluation.   DVT prophylaxis:  Place TED hose Start: 03/05/23 1025Eliquis Code Status:   Code Status: DNR Family Communication: plan of care discussed with patient/daughter Selena Batten on phone updated Patient status is: Inpatient because of CHF exacerbation Level of care: Progressive   Dispo: The patient is from: Home, Mobilizing independently with tech            Anticipated disposition: Pending clinical course.  Objective: Vitals last 24 hrs: Vitals:   03/05/23 2136 03/06/23 0500 03/06/23 0515 03/06/23 0922  BP:   (!) 151/44 132/75  Pulse: 85  90 90  Resp: 20  (!) 22   Temp:   98.1 F (36.7 C)   TempSrc:   Oral   SpO2: 94%  90%   Weight:  84.2 kg    Height:       Weight change: 0.058 kg  Physical Examination: General exam: alert awake, oriented at baseline, older than stated age HEENT:Oral mucosa moist, Ear/Nose WNL grossly Respiratory system: Bilaterally clear BS,no use of accessory muscle Cardiovascular system: S1 & S2 +, No JVD. Gastrointestinal system: Abdomen soft,NT,ND, BS+ Nervous System: Alert, awake, moving all extremities,and following commands. Extremities: LE edema +in bilateral ankle.Distal peripheral pulses palpable and warm.  Skin: No rashes,no icterus. MSK: Normal muscle bulk,tone, power   Medications reviewed:  Scheduled Meds:  apixaban  5 mg Oral BID   atorvastatin  40 mg Oral Daily   citalopram  20 mg Oral Daily   hydrALAZINE  25 mg Oral  TID   isosorbide mononitrate  30 mg Oral Daily   metoprolol succinate  75 mg Oral Daily   pantoprazole  40 mg Oral BID   potassium chloride  20 mEq Oral BID   sacubitril-valsartan  1 tablet Oral BID   spironolactone  25 mg Oral Daily   Continuous Infusions:    Diet Order             Diet Heart Room service appropriate? Yes; Fluid consistency: Thin; Fluid restriction: 1200 mL Fluid  Diet effective now                   Intake/Output Summary (Last 24 hours) at 03/06/2023 1139 Last data filed at 03/06/2023 0300 Gross per 24 hour  Intake 360 ml  Output 1800 ml  Net -1440 ml   Net IO Since Admission: -7,155 mL [03/06/23 1139]  Wt Readings from Last 3 Encounters:  03/06/23 84.2 kg  02/25/23 87.9 kg  02/24/23 88.5 kg     Unresulted Labs (From admission, onward)     Start     Ordered   03/06/23 0500  Basic metabolic panel  Daily,   R      03/05/23 1101   03/06/23 0500  CBC  Daily,   R      03/05/23 1101          Data Reviewed: I have personally reviewed following labs and imaging studies CBC: Recent Labs  Lab 03/02/23 1117 03/03/23 0352 03/04/23 0414 03/04/23 1401 03/06/23 0357  WBC 8.4 8.7 8.5 9.5 10.3  NEUTROABS 6.1  --  6.0  --   --   HGB 9.0* 9.0* 8.6* 8.8* 8.9*  HCT 30.1* 30.0* 29.8* 30.4* 30.9*  MCV 70.5* 71.4* 71.8* 73.4* 72.2*  PLT 213 186 195 221 202   Basic Metabolic Panel: Recent Labs  Lab 03/02/23 1117 03/03/23 0352 03/04/23 0414 03/05/23 1150 03/06/23 0357  NA 138 142 138 138 140  K 2.6* 2.8* 3.0* 3.1* 3.4*  CL 96* 97* 94* 94* 97*  CO2 30 32 33* 34* 34*  GLUCOSE 115* 106* 110* 111* 100*  BUN 16 14 13 20 23   CREATININE 1.33* 1.23* 1.21* 1.41* 1.63*  CALCIUM 8.8* 8.6* 8.6* 8.3* 8.4*  MG 1.6*  --  2.3  --  1.5*  PHOS  --   --  3.1  --   --   GFR: Estimated Creatinine Clearance: 27.2 mL/min (A) (by C-G formula based on SCr of 1.63 mg/dL (H)). Liver Function Tests: Recent Labs  Lab 03/02/23 1117 03/03/23 0352  AST 19 19  ALT 14 13   ALKPHOS 52 48  BILITOT 0.6 0.7  PROT 6.8 6.2*  ALBUMIN 3.5 3.2*    CBG: Recent Labs  Lab 03/03/23 2053  GLUCAP 159*   Recent Results (from the past 240 hour(s))  Resp panel by RT-PCR (RSV, Flu A&B, Covid) Anterior Nasal Swab     Status: None   Collection Time: 03/02/23 11:17 AM   Specimen: Anterior Nasal Swab  Result Value Ref Range Status   SARS Coronavirus 2 by RT PCR NEGATIVE NEGATIVE Final    Comment: (NOTE) SARS-CoV-2 target nucleic acids are NOT DETECTED.  The SARS-CoV-2 RNA is generally detectable in upper respiratory specimens during the acute phase of infection. The lowest concentration of SARS-CoV-2 viral copies this assay can detect is 138 copies/mL. A negative result does not preclude SARS-Cov-2 infection and should not be used as the sole basis for treatment or other patient management decisions. A negative result may occur with  improper specimen collection/handling, submission of specimen other than nasopharyngeal swab, presence of viral mutation(s) within the areas targeted by this assay, and inadequate number of viral copies(<138 copies/mL). A negative result must be combined with clinical observations, patient history, and epidemiological information. The expected result is Negative.  Fact Sheet for Patients:  BloggerCourse.com  Fact Sheet for Healthcare Providers:  SeriousBroker.it  This test is no t yet approved or cleared by the [place] FDA and  has been authorized for detection and/or diagnosis of SARS-CoV-2 by FDA under an Emergency Use Authorization (EUA). This EUA will remain  in effect (meaning this test can be used) for the duration of the COVID-19 declaration under Section 564(b)(1) of the Act, 21 U.S.C.section 360bbb-3(b)(1), unless the authorization is terminated  or revoked sooner.       Influenza A by PCR NEGATIVE NEGATIVE Final   Influenza B by PCR NEGATIVE NEGATIVE Final     Comment: (NOTE) The Xpert Xpress SARS-CoV-2/FLU/RSV plus assay is intended as an aid in the diagnosis of influenza from Nasopharyngeal swab specimens and should not be used as a sole basis for treatment. Nasal washings and aspirates are unacceptable for Xpert Xpress SARS-CoV-2/FLU/RSV testing.  Fact Sheet for Patients: BloggerCourse.com  Fact Sheet for Healthcare Providers: SeriousBroker.it  This test is not yet approved or cleared by the [place] FDA and has been authorized for detection and/or diagnosis of SARS-CoV-2 by FDA under an Emergency Use Authorization (EUA). This EUA will remain in effect (meaning this test can be used) for the duration of the COVID-19 declaration under Section 564(b)(1) of the Act, 21 U.S.C. section 360bbb-3(b)(1), unless the authorization is terminated or revoked.     Resp Syncytial Virus by PCR NEGATIVE NEGATIVE Final    Comment: (NOTE) Fact Sheet for Patients: BloggerCourse.com  Fact Sheet for Healthcare Providers: SeriousBroker.it  This test is not yet approved or cleared by the [place] FDA and has been authorized for detection and/or diagnosis of SARS-CoV-2 by FDA under an  Emergency Use Authorization (EUA). This EUA will remain in effect (meaning this test can be used) for the duration of the COVID-19 declaration under Section 564(b)(1) of the Act, 21 U.S.C. section 360bbb-3(b)(1), unless the authorization is terminated or revoked.  Performed at [facility], [address] [place], [place] [address]     Antimicrobials: Anti-infectives (From admission, onward)    None      Culture/Microbiology    Component Value Date/Time   SDES  02/13/2023 0835    URINE, CLEAN CATCH Performed at [facility], [address] [place], [place] [address]    SPECREQUEST  02/13/2023 1308     NONE Performed at [facility], [address], [place], [place] [address]    CULT MULTIPLE SPECIES PRESENT, SUGGEST RECOLLECTION (A) 02/13/2023 0835   REPTSTATUS 02/14/2023 FINAL 02/13/2023 0835    Radiology Studies: No results found.   LOS: 3 days   Lanae Boast, MD [facility]  03/06/2023, 11:39 AM   Managed with aggressive diuresis and overall much better.  Still having leg swelling

## 2023-03-07 DIAGNOSIS — I5033 Acute on chronic diastolic (congestive) heart failure: Secondary | ICD-10-CM | POA: Diagnosis not present

## 2023-03-07 MED ORDER — FUROSEMIDE 40 MG PO TABS: 40.0000 mg | ORAL_TABLET | ORAL | 0 refills | Status: DC

## 2023-03-07 MED ORDER — POTASSIUM CHLORIDE CRYS ER 10 MEQ PO TBCR: 10.0000 meq | EXTENDED_RELEASE_TABLET | ORAL | 0 refills | Status: AC

## 2023-03-07 MED ORDER — POTASSIUM CHLORIDE CRYS ER 10 MEQ PO TBCR: 10.0000 meq | EXTENDED_RELEASE_TABLET | ORAL | Status: DC

## 2023-03-07 MED ORDER — FUROSEMIDE 40 MG PO TABS: 40.0000 mg | ORAL_TABLET | ORAL | Status: DC

## 2023-03-07 MED ORDER — SPIRONOLACTONE 25 MG PO TABS: 25.0000 mg | ORAL_TABLET | Freq: Every day | ORAL | 0 refills | Status: DC

## 2023-03-07 MED ORDER — ISOSORBIDE MONONITRATE ER 30 MG PO TB24: 30.0000 mg | ORAL_TABLET | Freq: Every day | ORAL | 0 refills | Status: DC

## 2023-03-07 MED ORDER — APIXABAN 5 MG PO TABS: 5.0000 mg | ORAL_TABLET | Freq: Two times a day (BID) | ORAL | 0 refills | Status: DC

## 2023-03-07 MED ORDER — HYDRALAZINE HCL 25 MG PO TABS: 25.0000 mg | ORAL_TABLET | Freq: Three times a day (TID) | ORAL | 0 refills | Status: DC

## 2023-03-07 MED ORDER — SACUBITRIL-VALSARTAN 24-26 MG PO TABS: 1.0000 | ORAL_TABLET | Freq: Two times a day (BID) | ORAL | 0 refills | Status: DC

## 2023-03-07 NOTE — Progress Notes (Signed)
   03/07/23 0014  BiPAP/CPAP/SIPAP  BiPAP/CPAP/SIPAP Pt Type Adult  BiPAP/CPAP/SIPAP DREAMSTATIOND  Mask Type Full face mask  Mask Size Medium  Respiratory Rate 18 breaths/min  Flow Rate 3 lpm  Patient Home Equipment No  Auto Titrate Yes (5-20)

## 2023-03-07 NOTE — Progress Notes (Signed)
Rounding Note    Patient Name: Kara Bush Date of Encounter: 03/07/2023  [facility] Cardiologist: Chrystie Nose, MD   Subjective   82 year old female with a history of coronary artery disease, coronary artery bypass grafting and PCI.  She has chronic diastolic congestive heart failure, paroxysmal atrial fibrillation, hypertension, CKD.  She was admitted with acute on chronic diastolic congestive heart failure.  She developed shortness of breath and volume overload due to dietary indiscretion.  She has been diuresed.  She is net 7.6 L negative during this admission.  She is currently on Entresto 24-26 twice a day and spironolactone 25 mg a day.  She is feeling back to baseline.  She walked in the halls yesterday.  I encouraged her to walk the halls today.  I had a long discussion with Trayonna and her daughter Selena Batten.  She knows that she needs to greatly reduce her salt.  We talked about salt substitutes.  We talked about using the lounge doctor leg rest , compression hose    1. Leg elevation - I recommend the Lounge Dr. Leg rest.  See below for details  2. Salt restriction  -  Use potassium chloride instead of regular salt as a salt substitute. 3. Walk regularly 4. Compression hose - [employer] store  5. Weight loss    Available on [email or web address] Or  Go to [email or web address]      Spironolactone has been added to her home medications.  Sherryll Burger has been continued.  We will add Lasix 40 mg a day and potassium chloride 10 mill equivalents a day to be taken on Monday, Wednesdays, Fridays.  I have given her instructions to add another Lasix and potassium pill if she gains 3 pounds in a day or 5 pounds in a week.  She will follow-up with Dr. Rennis Golden or an APP in the next several weeks.    Inpatient Medications    Scheduled Meds:  apixaban  5 mg Oral BID   atorvastatin  40 mg Oral Daily   citalopram  20 mg Oral Daily   hydrALAZINE  25 mg Oral TID   isosorbide  mononitrate  30 mg Oral Daily   magnesium oxide  400 mg Oral BID   metoprolol succinate  75 mg Oral Daily   pantoprazole  40 mg Oral BID   potassium chloride  20 mEq Oral BID   sacubitril-valsartan  1 tablet Oral BID   spironolactone  25 mg Oral Daily   Continuous Infusions:  PRN Meds: acetaminophen **OR** acetaminophen, hydrALAZINE, HYDROcodone-acetaminophen, ipratropium-albuterol, morphine injection, ondansetron **OR** ondansetron (ZOFRAN) IV, mouth rinse, polyvinyl alcohol, traZODone   Vital Signs    Vitals:   03/06/23 1810 03/06/23 2053 03/07/23 0300 03/07/23 0548  BP: (!) 142/68 (!) 143/63  (!) 134/55  Pulse:  90  80  Resp:  18  (!) 21  Temp:  98.2 F (36.8 C)  98.2 F (36.8 C)  TempSrc:  Oral  Oral  SpO2:  93%  94%  Weight:   84.9 kg   Height:        Intake/Output Summary (Last 24 hours) at 03/07/2023 0948 Last data filed at 03/07/2023 0500 Gross per 24 hour  Intake --  Output 500 ml  Net -500 ml      03/07/2023    3:00 AM 03/06/2023    5:00 AM 03/05/2023    4:21 AM  Last 3 Weights  Weight (lbs) 187 lb 1.6 oz 185 lb 10 oz 185 lb  3 oz  Weight (kg) 84.868 kg 84.2 kg 84 kg      Telemetry    NSR  - Personally Reviewed  ECG     - Personally Reviewed  Physical Exam   GEN: No acute distress.   Neck: No JVD Cardiac: RRR, no murmurs, rubs, or gallops.  Respiratory: generally clear, scant  faint rales in R lower lung field  GI: Soft, nontender, non-distended  MS: 1 + pitting edema ( chronic issue for her )  Neuro:  Nonfocal  Psych: Normal affect   Labs    High Sensitivity Troponin:   Recent Labs  Lab 02/05/23 1430 02/06/23 0711 03/02/23 1117 03/02/23 1310  TROPONINIHS 633* 735* 24* 24*     Chemistry Recent Labs  Lab 03/02/23 1117 03/03/23 0352 03/04/23 0414 03/05/23 1150 03/06/23 0357 03/07/23 0445  NA 138 142 138 138 140 137  K 2.6* 2.8* 3.0* 3.1* 3.4* 3.8  CL 96* 97* 94* 94* 97* 98  CO2 30 32 33* 34* 34* 32  GLUCOSE 115* 106* 110* 111* 100*  97  BUN 16 14 13 20 23 23   CREATININE 1.33* 1.23* 1.21* 1.41* 1.63* 1.45*  CALCIUM 8.8* 8.6* 8.6* 8.3* 8.4* 8.1*  MG 1.6*  --  2.3  --  1.5*  --   PROT 6.8 6.2*  --   --   --   --   ALBUMIN 3.5 3.2*  --   --   --   --   AST 19 19  --   --   --   --   ALT 14 13  --   --   --   --   ALKPHOS 52 48  --   --   --   --   BILITOT 0.6 0.7  --   --   --   --   GFRNONAA 40* 44* 44* 37* 31* 36*  ANIONGAP 12 13 11 10 9 7     Lipids No results for input(s): "CHOL", "TRIG", "HDL", "LABVLDL", "LDLCALC", "CHOLHDL" in the last 168 hours.  Hematology Recent Labs  Lab 03/04/23 1401 03/06/23 0357 03/07/23 0445  WBC 9.5 10.3 9.4  RBC 4.14 4.28 3.93  HGB 8.8* 8.9* 8.3*  HCT 30.4* 30.9* 28.6*  MCV 73.4* 72.2* 72.8*  MCH 21.3* 20.8* 21.1*  MCHC 28.9* 28.8* 29.0*  RDW 17.7* 17.9* 17.6*  PLT 221 202 211   Thyroid No results for input(s): "TSH", "FREET4" in the last 168 hours.  BNP Recent Labs  Lab 03/02/23 1117  BNP 671.2*    DDimer No results for input(s): "DDIMER" in the last 168 hours.   Radiology    No results found.  Cardiac Studies      Patient Profile     82 y.o. female with acute on chronic diastolic congestive heart failure.  She admits to eating a high salt diet.  Assessment & Plan   =  Acute on chronic diastolic congestive heart failure: Echocardiogram from last July reveals normal left ventricular systolic function with EF of 55 to 60%.  We were unable to determine her diastolic function.  She is diuresed almost 8 L and is back to her normal baseline.  We have added spironolactone to her medical regimen.  I think that she is stable to be discharged today.  Will start with Lasix and potassium on Mondays, Wednesdays, Fridays.  She has been instructed to keep a blood pressure and weight log.  She has been instructed to call the office or  add Lasix and potassium if she gains 3 pounds in a day or 5 pounds in a week.  She will follow-up with Dr. Rennis Golden or an APP in the next several  weeks.  I have encouraged her to get some compression hose.  Will give her instructions on the lounge doctor leg rest.  I talked to her daughter Selena Batten about getting a lounge doctor leg rest.  We also discussed potentially going to the vein and vascular specialist for further treatment of her leg edema if needed.    [facility] will sign off.   Medication Recommendations: add lasix and Kdur on M,W,F. ( More often if she starts to develop volume overload ) Other recommendations (labs, testing, etc):    Follow up as an outpatient:   Dr. Rennis Golden or APP in several weeks.    For questions or updates, please contact [facility] Please consult [email or web address] for contact info under        Signed, Kristeen Miss, MD  03/07/2023, 9:48 AM

## 2023-03-07 NOTE — Discharge Summary (Signed)
Physician Discharge Summary   Patient: Kara Bush MRN: [id number] DOB: May 03, 1941  Admit date:     03/02/2023  Discharge date: 03/07/23  Discharge Physician: Alberteen Sam   PCP: Jackelyn Poling, DO     Recommendations at discharge:  Follow-up with PCP Dr. Vincente Poli in 1 week for congestive heart failure Dr. 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 F with CAD s/p CABG, HTN, PVD, PE on Eliquis, dCHF and ulcerative colitis who presented with progressive leg swelling, shortness of breath, and increased oxygen needs at home.  In the ER she required 4 L to keep her oxygen level 90%, had severe range hypertension, and elevated BNP.  Started on Lasix and admitted for CHF.  Has had a recent hospitalization for CVA, complicated by AKI, for which her Lasix was decreased and Entresto was held at discharge.       CHF The patient was admitted and started on IV Lasix.  She was diuresed 8 L, and cardiology resumed her Entresto, Imdur and metoprolol.  After discussion with cardiology, the patient elected to resume Eliquis.  Spironolactone was added.  Cardiology arrange for close follow-up.  She will need a BMP in 1 week.         The [facility] Controlled Substances Registry was reviewed for this patient prior to discharge.  Consultants: Cardiology  Disposition: Home health Diet recommendation:  Discharge Diet Orders  (From admission, onward)     Start     Ordered   03/07/23 0000  Diet - low sodium heart healthy        03/07/23 1130             DISCHARGE MEDICATION: Allergies as of 03/07/2023       Reactions   Biotin Hives   Lisinopril Cough   Atenolol Other (See Comments)   colitis Other reaction(s): Bowel issues   Duloxetine Hcl Other (See Comments)   Mad pt feel spacey Other reaction(s): shaky; tired in the PMs   Lyrica [pregabalin] Other (See Comments)   Made pt feel spacey   Penicillin G Rash   Other reaction(s): rash   Penicillins Rash   Sulfamethoxazole-trimethoprim Nausea And Vomiting   Other reaction(s): elevated liver functions        Medication List     STOP taking these medications    potassium chloride 10 MEQ tablet Commonly known as: KLOR-CON       TAKE these medications    apixaban 5 MG Tabs tablet Commonly known as: ELIQUIS Take 1 tablet (5 mg total) by mouth 2 (two) times daily. What changed:  medication strength how much to take   atorvastatin 40 MG tablet Commonly known as: LIPITOR Take 1 tablet (40 mg total) by mouth daily.   b complex vitamins capsule Take 1 capsule by mouth daily.   citalopram 20 MG tablet Commonly known as: CELEXA Take 1 tablet (20 mg total)  by mouth daily.   furosemide 40 MG tablet Commonly known as: LASIX Take 1 tablet (40 mg total) by mouth every Monday, Wednesday, and Friday. Start taking on: March 09, 2023 What changed:  when to take this reasons to take this   hydrALAZINE 25 MG tablet Commonly known as: APRESOLINE Take 1 tablet (25 mg total) by mouth 3 (three) times daily. What changed:  medication strength how much to take   HYDROcodone-acetaminophen 5-325 MG tablet Commonly known as: NORCO/VICODIN Take 1 tablet by mouth 4 (four) times daily as needed for moderate pain. May Take an extra 0.5 to one tablet when pain is severe.   isosorbide mononitrate 30 MG 24 hr tablet Commonly known as: IMDUR Take  1 tablet (30 mg total) by mouth daily. Start taking on: March 08, 2023 What changed: how much to take   metoprolol succinate 25 MG 24 hr tablet Commonly known as: TOPROL-XL Take 3 tablets (75 mg total) by mouth daily. Take with or immediately following a meal.   multivitamin with minerals Tabs tablet Take 1 tablet by mouth daily.   ondansetron 4 MG disintegrating tablet Commonly known as: ZOFRAN-ODT Take 1 tablet (4 mg total) by mouth every 8 (eight) hours.   pantoprazole 40 MG tablet Commonly known as: PROTONIX Take 1 tablet (40 mg total) by mouth 2 (two) times daily.   potassium chloride 10 MEQ tablet Commonly known as: KLOR-CON M Take 1 tablet (10 mEq total) by mouth every Monday, Wednesday, and Friday. Start taking on: March 09, 2023   sacubitril-valsartan 24-26 MG Commonly known as: ENTRESTO Take 1 tablet by mouth 2 (two) times daily.   spironolactone 25 MG tablet Commonly known as: ALDACTONE Take 1 tablet (25 mg total) by mouth daily. Start taking on: March 08, 2023   traZODone 50 MG tablet Commonly known as: DESYREL Take 1 tablet (50 mg total) by mouth at bedtime as needed for sleep.        Follow-up Information     [facility] Follow up.   Specialty: Home Health Services Why: Home Health Physical Therapy Contact information: [address] [place] [address] [phone]         Jackelyn Poling, DO. Schedule an appointment as soon as possible for a visit in 1 week(s).   Specialty: Family Medicine Why: and get labs Contact information: [address] [place] [address] [phone]                 Discharge Instructions     Diet - low sodium heart healthy   Complete by: As directed    Discharge instructions   Complete by: As directed    **IMPORTANT DISCHARGE INSTRUCTIONS**   From Drs. KC and : You were admitted for congestive heart failure flaring up. This was treated with diuretics (Lasix)  to pull the fluid off  You should take furosemide/Lasix 40 mg Mon, Weds, Fri Take the potassium supplement on days you take Furosemide/Lasix  Weigh yourself daily Your weight today when you get home is your "dry weight" (the weight when all the excess fluid has been removed) If your weight is after 5 pounds over your dry weight, or ever goes up by 3 pounds in a single day, then increase your Lasix to taking every day (instead of 3 times a week) and call Dr. Blanchie Dessert office   You have an appointment with Dr. Blanchie Dessert office in 3 weeks (see below in To Do section)  Schedule an  appointment with your PCP in 1 week and get labs drawn Ask that they fax the results to Dr. Rennis Golden  For medicine changes, there are several: Reduce your Lasix to 3 times a week, Monday Wednesday Friday Add spironolactone 25 mg daily Add Entresto 24-26 mg twice daily Increase Eliquis to 5 mg twice daily, as per your discussion with Dr. Jonathon Bellows and Dr. Duke Salvia REDUCE your dose of hydralazine for now to 25 mg daily Continue Imdur, but increase the dose to 30 mg   Increase activity slowly   Complete by: As directed        Discharge Exam: Filed Weights   03/05/23 0421 03/06/23 0500 03/07/23 0300  Weight: 84 kg 84.2 kg 84.9 kg    General: Pt is alert, awake, not in acute distress Cardiovascular: RRR, nl S1-S2, no murmurs appreciated.  Some residual lower extremity edema.   Respiratory: Normal respiratory rate and rhythm.  CTAB without rales or wheezes. Abdominal: Abdomen soft and non-tender.  No distension or HSM.   Neuro/Psych: Strength symmetric in upper and lower extremities.  Judgment and insight appear normal.   Condition at discharge: fair  The results of significant diagnostics from this hospitalization (including imaging, microbiology, ancillary and laboratory) are listed below for reference.   Imaging Studies: DG Chest 2 View  Result Date: 03/02/2023 CLINICAL DATA:  sob EXAM: CHEST - 2 VIEW COMPARISON:   CXR 02/10/23 FINDINGS: No pleural effusion. No pneumothorax. Status post median sternotomy and CABG. Unchanged cardiac and mediastinal contours. No focal airspace opacity. No radiographically apparent displaced rib fractures. Visualized upper abdomen is unremarkable. Vertebral body heights are maintained. IMPRESSION: No focal airspace opacity. Electronically Signed   By: Lorenza Cambridge M.D.   On: 03/02/2023 12:25   US RENAL  Result Date: 02/13/2023 CLINICAL DATA:  AKI EXAM: RENAL / URINARY TRACT ULTRASOUND COMPLETE COMPARISON:  CT chest abdomen pelvis 02/04/2023 FINDINGS: Right Kidney: Renal measurements: 9.5 x 5.1 x 5.9 cm = volume: 151 mL. Echogenicity within normal limits. No mass or hydronephrosis visualized. Left Kidney: Renal measurements: 8.4 x 3.7 x 4.0 cm = volume: 64 mL. Echogenicity within normal limits. No mass or hydronephrosis visualized. Bladder: Appears normal for degree of bladder distention. Other: Limited exam due to body habitus and labored breathing. IMPRESSION: Unremarkable sonographic appearance of the kidneys. Electronically Signed   By: Minerva Fester M.D.   On: 02/13/2023 20:04   DG CHEST PORT 1 VIEW  Result Date: 02/10/2023 CLINICAL DATA:  Dyspnea. EXAM: PORTABLE CHEST 1 VIEW COMPARISON:  February 06, 2023. FINDINGS: Stable cardiomegaly. Status post coronary artery bypass graft. Minimal bibasilar subsegmental atelectasis is noted with possible small pleural effusions. Bony thorax unremarkable. IMPRESSION: Minimal bibasilar subsegmental atelectasis is noted with possible small pleural effusions. Electronically Signed   By: Lupita Raider M.D.   On: 02/10/2023 12:40   DG ESOPHAGUS W SINGLE CM (SOL OR THIN BA)  Result Date: 02/09/2023 CLINICAL DATA:  Patient with difficulty swallowing.  Dysphagia. EXAM: ESOPHOGRAM/BARIUM SWALLOW TECHNIQUE: Single contrast examination was performed using thin barium. 13 mm barium tablet was also utilized. FLUOROSCOPY: Radiation Exposure Index (as provided  by the fluoroscopic device): 1 minute 30 seconds COMPARISON:  None Available. FINDINGS: Oral and pharyngeal phases are negative. No aspiration. No evidence of cervical stricture. No fixed lesion of the thoracic esophagus. No hiatal hernia. Advanced soft a geode dysmotility. Poor primary stripping wave. Abnormal tertiary contractions. Stasis within the esophagus. Abnormal to and fro flow. No evidence of esophagitis or ulceration. 13.5 mm barium  tablet passes after a few additional swallows, ruling out fixed stricture. IMPRESSION: 1. Advanced presbyesophagus/esophageal dysmotility. Poor primary stripping wave. Abnormal tertiary contractions. Stasis within the esophagus. Abnormal to and fro flow. 2. No evidence of esophagitis or ulceration. No hernia or fixed stricture. 3. 13.5 mm barium tablet passes after a few additional swallows, ruling out fixed stricture. Electronically Signed   By: Paulina Fusi M.D.   On: 02/09/2023 13:33   CT HEAD WO CONTRAST ( )  Result Date: 02/08/2023 CLINICAL DATA:  Altered mental status EXAM: CT HEAD WITHOUT CONTRAST TECHNIQUE: Contiguous axial images were obtained from the base of the skull through the vertex without intravenous contrast. RADIATION DOSE REDUCTION: This exam was performed according to the departmental dose-optimization program which includes automated exposure control, adjustment of the mA and/or kV according to patient size and/or use of iterative reconstruction technique. COMPARISON:  None Available. FINDINGS: Brain: There is no mass, hemorrhage or extra-axial collection. There is generalized atrophy without lobar predilection. Hypodensity of the white matter is most commonly associated with chronic microvascular disease. Vascular: Atherosclerotic calcification of the internal carotid arteries at the skull base. No abnormal hyperdensity of the major intracranial arteries or dural venous sinuses. Skull: The visualized skull base, calvarium and extracranial soft  tissues are normal. Sinuses/Orbits: No fluid levels or advanced mucosal thickening of the visualized paranasal sinuses. No mastoid or middle ear effusion. The orbits are normal. IMPRESSION: 1. No acute intracranial abnormality. 2. Generalized atrophy and findings of chronic microvascular disease. Electronically Signed   By: Deatra Robinson M.D.   On: 02/08/2023 19:34   EEG adult  Result Date: 02/06/2023 Charlsie Quest, MD     02/06/2023  5:09 PM Patient Name: RAEANNA SOBERANES MRN: [id number] Epilepsy Attending: Charlsie Quest Referring Physician/Provider: Therisa Doyne, MD Date: 02/06/2023 Duration: 30.57 mins Patient history: 82yo F with ams getting eeg to evaluate for seizure. Level of alertness: Awake, asleep AEDs during EEG study: Ativan Technical aspects: This EEG study was done with scalp electrodes positioned according to the 10-20 International system of electrode placement. Electrical activity was reviewed with band pass filter of 1-70Hz , sensitivity of 7 uV/mm, display speed of 10mm/sec with a 60Hz  notched filter applied as appropriate. EEG data were recorded continuously and digitally stored.  Video monitoring was available and reviewed as appropriate. Description: The posterior dominant rhythm consists of 8-9 Hz activity of moderate voltage (25-35 uV) seen predominantly in posterior head regions, symmetric and reactive to eye opening and eye closing. Sleep was characterized by vertex waves, sleep spindles (12 to 14 Hz), maximal frontocentral region. EEG showed intermittent generalized polymorphic 3 to 6 Hz theta-delta slowing. Hyperventilation and photic stimulation were not performed.   ABNORMALITY - Intermittent slow, generalized IMPRESSION: This study is suggestive of mild diffuse encephalopathy, nonspecific etiology. No seizures or epileptiform discharges were seen throughout the recording. Charlsie Quest   DG CHEST PORT 1 VIEW  Result Date: 02/06/2023 CLINICAL DATA:  Dyspnea. EXAM:  PORTABLE CHEST 1 VIEW COMPARISON:  02/04/2023. FINDINGS: Previous CABG surgery. Cardiac silhouette is normal in size. No mediastinal or hilar masses. Lungs are hyperexpanded. Mild linear opacities noted at the left lung base consistent with scarring or atelectasis. Lungs otherwise clear. No convincing pleural effusion and no pneumothorax. Skeletal structures are grossly intact. IMPRESSION: No acute cardiopulmonary disease. No significant change from the recent prior study. Electronically Signed   By: Amie Portland M.D.   On: 02/06/2023 12:14    Microbiology: Results for orders placed or performed during  the hospital encounter of 03/02/23  Resp panel by RT-PCR (RSV, Flu A&B, Covid) Anterior Nasal Swab     Status: None   Collection Time: 03/02/23 11:17 AM   Specimen: Anterior Nasal Swab  Result Value Ref Range Status   SARS Coronavirus 2 by RT PCR NEGATIVE NEGATIVE Final    Comment: (NOTE) SARS-CoV-2 target nucleic acids are NOT DETECTED.  The SARS-CoV-2 RNA is generally detectable in upper respiratory specimens during the acute phase of infection. The lowest concentration of SARS-CoV-2 viral copies this assay can detect is 138 copies/mL. A negative result does not preclude SARS-Cov-2 infection and should not be used as the sole basis for treatment or other patient management decisions. A negative result may occur with  improper specimen collection/handling, submission of specimen other than nasopharyngeal swab, presence of viral mutation(s) within the areas targeted by this assay, and inadequate number of viral copies(<138 copies/mL). A negative result must be combined with clinical observations, patient history, and epidemiological information. The expected result is Negative.  Fact Sheet for Patients:  BloggerCourse.com  Fact Sheet for Healthcare Providers:  SeriousBroker.it  This test is no t yet approved or cleared by the [place] FDA and  has been authorized for detection and/or diagnosis of SARS-CoV-2 by FDA under an Emergency Use Authorization (EUA). This EUA will remain  in effect (meaning this test can be used) for the duration of the COVID-19 declaration under Section 564(b)(1) of the Act, 21 U.S.C.section 360bbb-3(b)(1), unless the authorization is terminated  or revoked sooner.       Influenza A by PCR NEGATIVE NEGATIVE Final   Influenza B by PCR NEGATIVE NEGATIVE Final    Comment: (NOTE) The Xpert Xpress SARS-CoV-2/FLU/RSV plus assay is intended as an aid in the diagnosis of influenza from Nasopharyngeal swab specimens and should not be used as a sole basis for treatment. Nasal washings and aspirates are unacceptable for Xpert Xpress SARS-CoV-2/FLU/RSV testing.  Fact Sheet for Patients: BloggerCourse.com  Fact Sheet for Healthcare Providers: SeriousBroker.it  This test is not yet approved or cleared by the [place] FDA and has been authorized for detection and/or diagnosis of SARS-CoV-2 by FDA under an Emergency Use Authorization (EUA). This EUA will remain in effect (meaning this test can be used) for the duration of the COVID-19 declaration under Section 564(b)(1) of the Act, 21 U.S.C. section 360bbb-3(b)(1), unless the authorization is terminated or revoked.     Resp Syncytial Virus by PCR NEGATIVE NEGATIVE Final    Comment: (NOTE) Fact Sheet for Patients: BloggerCourse.com  Fact Sheet for Healthcare Providers: SeriousBroker.it  This test is not yet approved or cleared by the [place] FDA and has been authorized for detection and/or diagnosis of SARS-CoV-2 by FDA under an Emergency Use Authorization (EUA). This EUA will remain in effect (meaning this test can be used) for the duration of the COVID-19 declaration under Section 564(b)(1) of the Act, 21 U.S.C. section  360bbb-3(b)(1), unless the authorization is terminated or revoked.  Performed at [facility], [address], [place], [place] [address]     Labs: CBC: Recent Labs  Lab 03/02/23 1117 03/03/23 0352 03/04/23 0414 03/04/23 1401 03/06/23 0357 03/07/23 0445  WBC 8.4 8.7 8.5 9.5 10.3 9.4  NEUTROABS 6.1  --  6.0  --   --   --   HGB 9.0* 9.0* 8.6* 8.8* 8.9* 8.3*  HCT 30.1* 30.0* 29.8* 30.4* 30.9* 28.6*  MCV 70.5* 71.4* 71.8* 73.4* 72.2* 72.8*  PLT 213 186 195 221  202 211   Basic Metabolic Panel: Recent Labs  Lab 03/02/23 1117 03/03/23 0352 03/04/23 0414 03/05/23 1150 03/06/23 0357 03/07/23 0445  NA 138 142 138 138 140 137  K 2.6* 2.8* 3.0* 3.1* 3.4* 3.8  CL 96* 97* 94* 94* 97* 98  CO2 30 32 33* 34* 34* 32  GLUCOSE 115* 106* 110* 111* 100* 97  BUN 16 14 13 20 23 23   CREATININE 1.33* 1.23* 1.21* 1.41* 1.63* 1.45*  CALCIUM 8.8* 8.6* 8.6* 8.3* 8.4* 8.1*  MG 1.6*  --  2.3  --  1.5*  --   PHOS  --   --  3.1  --   --   --    Liver Function Tests: Recent Labs  Lab 03/02/23 1117 03/03/23 0352  AST 19 19  ALT 14 13  ALKPHOS 52 48  BILITOT 0.6 0.7  PROT 6.8 6.2*  ALBUMIN 3.5 3.2*   CBG: Recent Labs  Lab 03/03/23 2053  GLUCAP 159*    Discharge time spent: approximately 35 minutes spent on discharge counseling, evaluation of patient on day of discharge, and coordination of discharge planning with nursing, social work, pharmacy and case management  Signed: Alberteen Sam, MD [facility] 03/07/2023

## 2023-03-09 DIAGNOSIS — Z6831 Body mass index (BMI) 31.0-31.9, adult: Secondary | ICD-10-CM | POA: Diagnosis not present

## 2023-03-09 DIAGNOSIS — I5030 Unspecified diastolic (congestive) heart failure: Secondary | ICD-10-CM | POA: Diagnosis not present

## 2023-03-09 DIAGNOSIS — I482 Chronic atrial fibrillation, unspecified: Secondary | ICD-10-CM | POA: Diagnosis not present

## 2023-03-11 DIAGNOSIS — Z09 Encounter for follow-up examination after completed treatment for conditions other than malignant neoplasm: Secondary | ICD-10-CM | POA: Diagnosis not present

## 2023-03-11 DIAGNOSIS — I5032 Chronic diastolic (congestive) heart failure: Secondary | ICD-10-CM | POA: Diagnosis not present

## 2023-03-16 NOTE — Progress Notes (Unsigned)
Office Visit Note  Patient: Kara Bush             Date of Birth: 05-05-1941           MRN: [id number]             PCP: Jackelyn Poling, DO Referring: Jackelyn Poling, DO Visit Date: 03/17/2023 Occupation: @GUAROCC @  Subjective:  Right-sided lower back pain   History of Present Illness: Kara Bush is a 82 y.o. female with history of osteoarthritis and fibromyalgia.  Patient presents today with increased right sided lower back pain which started 2 to 3 weeks ago.  She has had 2 hospitalizations since July and attributes the increased discomfort in her right lower back and right leg due to having to sleep in a hospital bed.  She denies any injury or fall prior to the onset of symptoms.   Patient states that she has been having increased discomfort in her lower back at night as well as with ambulation.  She describes the pain as an aching sensation while walking.  She has also noticed numbness and radiating pain down to her right ankle.  She denies any weakness in her lower extremities.  She denies any joint swelling.  She has been taking hydrocodone and Tylenol for pain relief. Patient states that overall her fibromyalgia has been well-controlled.  She has had some increased fatigue which she attributes to afib.    Activities of Daily Living:  Patient reports morning stiffness for 1 hour.   Patient Reports nocturnal pain.  Difficulty dressing/grooming: Reports Difficulty climbing stairs: Reports Difficulty getting out of chair: Denies Difficulty using hands for taps, buttons, cutlery, and/or writing: Denies  Review of Systems  Constitutional:  Positive for fatigue.  HENT:  Positive for mouth dryness. Negative for mouth sores.   Eyes:  Positive for dryness.  Respiratory:  Positive for shortness of breath.   Cardiovascular:  Positive for chest pain and palpitations.  Gastrointestinal:  Negative for blood in stool, constipation and diarrhea.  Endocrine: Negative for  increased urination.  Genitourinary:  Negative for involuntary urination.  Musculoskeletal:  Positive for joint pain, gait problem, joint pain, myalgias, muscle weakness, morning stiffness, muscle tenderness and myalgias. Negative for joint swelling.  Skin:  Negative for color change, rash, hair loss and sensitivity to sunlight.  Allergic/Immunologic: Negative for susceptible to infections.  Neurological:  Negative for dizziness and headaches.  Hematological:  Negative for swollen glands.  Psychiatric/Behavioral:  Positive for sleep disturbance. Negative for depressed mood. The patient is not nervous/anxious.     PMFS History:  Patient Active Problem List   Diagnosis Date Noted   Acute on chronic systolic CHF (congestive heart failure) (HCC) 03/03/2023   Acute on chronic diastolic congestive heart failure (HCC) 03/02/2023   Pulmonary embolism with infarction (HCC) 03/02/2023   Chronic heart failure with preserved ejection fraction (HFpEF) (HCC) 02/11/2023   Hypervolemia 02/11/2023   Acute encephalopathy 02/05/2023   Elevated troponin 02/05/2023   Hypokalemia 02/05/2023   Hypomagnesemia 02/05/2023   Intractable nausea and vomiting 02/05/2023   Metabolic alkalosis 02/05/2023   Demand ischemia 11/24/2022   Chest pain 11/23/2022   Non-ST elevation (NSTEMI) myocardial infarction (HCC) 11/23/2022   Acute gastrointestinal bleeding 11/23/2022   Chronic combined systolic (congestive) and diastolic (congestive) heart failure (HCC) 11/23/2022   CKD (chronic kidney disease) stage 3, GFR 30-59 ml/min (HCC) 11/23/2022   DNR (do not resuscitate) 11/23/2022   Anxiety disorder 09/25/2021   Paroxysmal atrial fibrillation (HCC) 02/26/2021  Atrial fibrillation (HCC) 09/09/2018   DOE (dyspnea on exertion) 08/10/2017   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   Greater trochanteric bursitis of right hip 11/07/2014   Sacro-iliac pain 11/07/2014   Chronic midline low back pain without  sciatica 11/07/2013   Hepatitis 04/20/2013   Screening for STD (sexually transmitted disease) 04/20/2013   Recurrent UTI 04/20/2013   Abdominal aortic aneurysm (HCC)    Ulcerative colitis (HCC)    Obstructive sleep apnea    Fibromyalgia    Coronary artery disease    Basal cell carcinoma    Symptomatic anemia 01/11/2013   Ascending aortic aneurysm, 4.1 cm 01/11/2013   Cervical spondylolysis 10/15/2011   Chronic periscapular pain 10/15/2011   Chest wall pain    S/P resection of aortic aneurysm, 1998 prior to CABG    PAD (peripheral artery disease), aortic-iliac bypass 1991, mild carotid bruits    OSA (obstructive sleep apnea)    Chronic fatigue syndrome with fibromyalgia    Hyperlipidemia    Ulcerative colitis    Depression    CAD (coronary artery disease), CABG 1998, STENTS MULTIPLE SITES SINCE.     Accelerated hypertension     Past Medical History:  Diagnosis Date   Abdominal aortic aneurysm (HCC)    Anemia, improved 01/11/2013   Basal cell carcinoma    CAD (coronary artery disease)    hx CABG 1998, last cath 2007 with PCI and stenting to the proximal segment of the vein graft to the diagonal branch. DES Taxus was placed   Cervicalgia    Chronic fatigue fibromyalgia syndrome    Chronic pain syndrome    Depression    Fibromyalgia    History of nuclear stress test 08/07/2011   lexiscan; no evidence of ischemia or infarct; low risk    Hyperlipidemia    Hypertension    OSA (obstructive sleep apnea)    PAD (peripheral artery disease) (HCC)    Pes anserine bursitis    Pulmonary embolism (HCC) 01/11/2013   S/P resection of aortic aneurysm    Trochanteric bursitis    Ulcerative colitis (HCC)     Family History  Problem Relation Age of Onset   Heart failure Mother        at 80   Heart disease Father    Heart attack Father        @ 44   Healthy Sister    Cancer Brother        pancreatic    Cancer Paternal Aunt        BREAST   Heart failure Maternal Grandmother     Heart attack Maternal Grandfather    Heart attack Paternal Grandmother        at 20   Healthy Daughter    Healthy Daughter    Past Surgical History:  Procedure Laterality Date   AORTOBIEXTERNAL ILIAC BYPASS  1991   BIOPSY  11/25/2022   Procedure: BIOPSY;  Surgeon: Jeani Hawking, MD;  Location: [facility] ENDOSCOPY;  Service: Gastroenterology;;   CATARACT EXTRACTION Left 09/2019   CHOLECYSTECTOMY  1997   COLONOSCOPY WITH PROPOFOL N/A 11/25/2022   Procedure: COLONOSCOPY WITH PROPOFOL;  Surgeon: Jeani Hawking, MD;  Location: [facility] ENDOSCOPY;  Service: Gastroenterology;  Laterality: N/A;   CORONARY ANGIOPLASTY WITH STENT PLACEMENT  2006   to LAD, ATRETIC LIMA AT THAT TIME ALSO   CORONARY ANGIOPLASTY WITH STENT PLACEMENT  2007   STENT TO PROX VG-DIAG, OTHER STENTS PATENT   CORONARY ARTERY BYPASS GRAFT  1998  LIMA-LAD;VG-DIAG & RCA   CORONARY STENT PLACEMENT  2005   TO LAD & LCX-STENTS,   ESOPHAGOGASTRODUODENOSCOPY (EGD) WITH PROPOFOL N/A 11/25/2022   Procedure: ESOPHAGOGASTRODUODENOSCOPY (EGD) WITH PROPOFOL;  Surgeon: Jeani Hawking, MD;  Location: [facility] ENDOSCOPY;  Service: Gastroenterology;  Laterality: N/A;   HERNIA REPAIR     TUBAL LIGATION     BTL   Social History   Social History Narrative   Not on file   Immunization History  Administered Date(s) Administered   Influenza,inj,Quad PF,6+ Mos 04/20/2013, 06/01/2014   PFIZER(Purple Top)SARS-COV-2 Vaccination 08/18/2019, 09/11/2019, 04/27/2020     Objective: Vital Signs: BP (!) 148/75 (BP Location: Left Arm, Patient Position: Sitting, Cuff Size: Normal)   Pulse 86   Resp 15   Ht 5\' 3"  (1.6 m)   Wt 173 lb 6.4 oz (78.7 kg)   BMI 30.72 kg/m    Physical Exam Vitals and nursing note reviewed.  Constitutional:      Appearance: She is well-developed.  HENT:     Head: Normocephalic and atraumatic.  Eyes:     Conjunctiva/sclera: Conjunctivae normal.  Cardiovascular:     Rate and Rhythm: Rhythm irregular.     Heart sounds: Murmur heard.   Pulmonary:     Effort: Pulmonary effort is normal.     Breath sounds: Normal breath sounds.  Abdominal:     General: Bowel sounds are normal.     Palpations: Abdomen is soft.  Musculoskeletal:     Cervical back: Normal range of motion.  Lymphadenopathy:     Cervical: No cervical adenopathy.  Skin:    General: Skin is warm and dry.     Capillary Refill: Capillary refill takes less than 2 seconds.  Neurological:     Mental Status: She is alert and oriented to person, place, and time.  Psychiatric:        Behavior: Behavior normal.      Musculoskeletal Exam: C-spine has limited ROM.  Thoracic kyphosis. Painful ROM of lumbar spine.  Midline spinal tenderness in the lumbar region.  Tenderness over the right SI joint and right piriformis muscle. Shoulder joints, elbow joints, wrist joints, MCPs, PIPs, and DIPs good ROM with no synovitis.  Complete fist formation bilaterally.  CMC prominence bilaterally.  Hip joints have good ROM with no groin pain.  Tenderness along the right IT band.  Knee joints have good ROM with no warmth or effusion.  Ankle joints have good ROM with no tenderness or joint swelling.    CDAI Exam: CDAI Score: -- Patient Global: --; Provider Global: -- Swollen: --; Tender: -- Joint Exam 03/17/2023   No joint exam has been documented for this visit   There is currently no information documented on the homunculus. Go to the Rheumatology activity and complete the homunculus joint exam.  Investigation: No additional findings.  Imaging: XR Pelvis 1-2 Views  Result Date: 03/17/2023 No SI joint narrowing was noted.  Mild sclerosis was noted in the right SI joint.  Moderate narrowing of bilateral hip joints was noted.  No chondrocalcinosis was noted. Impression: These findings are suggestive of mild osteoarthritis of the right SI joint and moderate osteoarthritis of bilateral hip joints.  XR Lumbar Spine 2-3 Views  Result Date: 03/17/2023 Multilevel spondylosis with  most severe narrowing between L1-L2, L2-L3 was noted.  L4-L5 with spondylolisthesis was noted.  Facet joint arthropathy was noted.  Atherosclerosis of the aorta was noted. Impression: These findings are consistent with multilevel spondylosis and facet joint arthropathy.  DG Chest 2  View  Result Date: 03/02/2023 CLINICAL DATA:  sob EXAM: CHEST - 2 VIEW COMPARISON:  CXR 02/10/23 FINDINGS: No pleural effusion. No pneumothorax. Status post median sternotomy and CABG. Unchanged cardiac and mediastinal contours. No focal airspace opacity. No radiographically apparent displaced rib fractures. Visualized upper abdomen is unremarkable. Vertebral body heights are maintained. IMPRESSION: No focal airspace opacity. Electronically Signed   By: Lorenza Cambridge M.D.   On: 03/02/2023 12:25    Recent Labs: Lab Results  Component Value Date   WBC 9.4 03/07/2023   HGB 8.3 (L) 03/07/2023   PLT 211 03/07/2023   NA 137 03/07/2023   K 3.8 03/07/2023   CL 98 03/07/2023   CO2 32 03/07/2023   GLUCOSE 97 03/07/2023   BUN 23 03/07/2023   CREATININE 1.45 (H) 03/07/2023   BILITOT 0.7 03/03/2023   ALKPHOS 48 03/03/2023   AST 19 03/03/2023   ALT 13 03/03/2023   PROT 6.2 (L) 03/03/2023   ALBUMIN 3.2 (L) 03/03/2023   CALCIUM 8.1 (L) 03/07/2023   GFRAA 73 10/13/2019    Speciality Comments: No specialty comments available.  Procedures:  No procedures performed Allergies: Biotin, Lisinopril, Atenolol, Duloxetine hcl, Lyrica [pregabalin], Penicillin g, Penicillins, and Sulfamethoxazole-trimethoprim   Assessment / Plan:     Visit Diagnoses: Primary osteoarthritis of both hands: CMC, PIP, DIP thickening consistent with osteoarthritis of both hands.  No synovitis or dactylitis noted.  Complete fist formation bilaterally.  Discussed the importance of joint protection and muscle strengthening.  Trochanteric bursitis of both hips: Patient experiences intermittent discomfort on the lateral aspect of both hips especially the  right side.  On examination today she has mild tenderness over the right trochanteric bursa as well as along the right IT band.  Her symptoms are recently exacerbated by sleeping in her hospital bed.  She will benefit from physical therapy.  Cervical spondylolysis: C-spine has limited range of motion with lateral rotation.  No symptoms of radiculopathy at this time.  Chronic right SI joint pain -Patient presents today with increased right-sided lower back pain.  No recent injury or fall.  She was hospitalized twice since July and had to sleep in a hospital bed which likely exacerbated her discomfort.  On examination she has midline spinal tenderness in the lumbar region as well as tenderness over the right SI joint.  X-rays of the pelvis were obtained today which revealed mild arthritis in the sacroiliac joint as well as moderate arthritis in both hips.  On examination she has good range of motion of both hip joints with no groin pain.  Most of her discomfort seems to be due to multilevel spondylosis in the lumbar region.   Referral to physical therapy will be placed today.  Plan: XR Pelvis 1-2 Views  Chronic right-sided low back pain with right-sided sciatica -Patient presents today with increased discomfort in her lower back which started 2 to 3 weeks ago.  She was recently hospitalized and was discharged on 03/07/2023.  Patient experiences her increased discomfort due to having to be in a hospital bed during her recent hospitalizations.  She has not had any recent injury or fall.  She is experiencing right-sided radiculopathy--numbness and pain radiating down the lateral aspect of her right leg to her right ankle.  No weakness in her lower extremities.  She has been experiencing nocturnal pain as well as some difficulty with ambulation. No fevers or chills. On examination she has painful range of motion of the lumbar spine.  Midline spinal tenderness in  the lumbar region as well as tenderness over the right  SI joint and piriformis.  She also has some tenderness extending along the right IT band.  X-rays of the lumbar spine and pelvis were obtained today for further evaluation.  Findings are consistent with multilevel spondylosis and facet joint arthropathy.  Different treatment options were discussed.  Offered a referral to a spine specialist but she would like to hold off at this time.  Offered referral to physical therapy which she is open to--will be placed for [facility] as requested.  She is not a good candidate for corticosteroids due to congestive heart failure, atrial fibrillation, and high blood pressure. She plans on taking hydrocodone for pain relief. Plan: XR Lumbar Spine 2-3 Views  Fibromyalgia: Well-controlled.  She has not had any recent fibromyalgia flares.  Her myofascial pain has been tolerable.  Primary insomnia - She takes trazodone 50 mg 1 tablet at bedtime as needed for insomnia.  Other medical conditions are listed as follows:  History of claudication - Under care of Dr. Randie Heinz.  History of aortobi-iliac artery bypass in 1991 for severe claudication.  Acute on chronic diastolic heart failure (HCC): Recently hospitalized--discharged on 03/07/2023.  Patient has followed up with her PCP outpatient as advised.  Coronary artery disease status post CABG  History of depression  History of hypertension: Blood pressure was elevated today in the office and was rechecked prior to leaving.  Patient has not yet taken her antihypertensive medications today patient was advised to take her medications as prescribed and to monitor her blood pressure closely.   Paroxysmal atrial fibrillation (HCC)  History of ulcerative colitis  History of hyperlipidemia  OSA (obstructive sleep apnea)  Osteoporosis screening - DEXA followed by PCP    Orders: Orders Placed This Encounter  Procedures   XR Pelvis 1-2 Views   XR Lumbar Spine 2-3 Views   No orders of the defined types were placed  in this encounter.     Follow-Up Instructions: Return in about 6 months (around 09/17/2023) for Fibromyalgia, Osteoarthritis.   Gearldine Bienenstock, PA-C  Note - This record has been created using Dragon software.  Chart creation errors have been sought, but may not always  have been located. Such creation errors do not reflect on  the standard of medical care.

## 2023-03-17 ENCOUNTER — Ambulatory Visit (INDEPENDENT_AMBULATORY_CARE_PROVIDER_SITE_OTHER): Payer: PPO

## 2023-03-17 ENCOUNTER — Encounter: Payer: Self-pay | Admitting: Physician Assistant

## 2023-03-17 ENCOUNTER — Ambulatory Visit: Payer: PPO | Attending: Physician Assistant | Admitting: Physician Assistant

## 2023-03-17 VITALS — BP 148/75 | HR 86 | Resp 15 | Ht 63.0 in | Wt 173.4 lb

## 2023-03-17 DIAGNOSIS — Z8639 Personal history of other endocrine, nutritional and metabolic disease: Secondary | ICD-10-CM

## 2023-03-17 DIAGNOSIS — I2510 Atherosclerotic heart disease of native coronary artery without angina pectoris: Secondary | ICD-10-CM

## 2023-03-17 DIAGNOSIS — Z13820 Encounter for screening for osteoporosis: Secondary | ICD-10-CM

## 2023-03-17 DIAGNOSIS — M19041 Primary osteoarthritis, right hand: Secondary | ICD-10-CM

## 2023-03-17 DIAGNOSIS — M25511 Pain in right shoulder: Secondary | ICD-10-CM

## 2023-03-17 DIAGNOSIS — M7061 Trochanteric bursitis, right hip: Secondary | ICD-10-CM | POA: Diagnosis not present

## 2023-03-17 DIAGNOSIS — Z8659 Personal history of other mental and behavioral disorders: Secondary | ICD-10-CM

## 2023-03-17 DIAGNOSIS — F5101 Primary insomnia: Secondary | ICD-10-CM | POA: Diagnosis not present

## 2023-03-17 DIAGNOSIS — M5441 Lumbago with sciatica, right side: Secondary | ICD-10-CM

## 2023-03-17 DIAGNOSIS — M797 Fibromyalgia: Secondary | ICD-10-CM

## 2023-03-17 DIAGNOSIS — M7062 Trochanteric bursitis, left hip: Secondary | ICD-10-CM

## 2023-03-17 DIAGNOSIS — M533 Sacrococcygeal disorders, not elsewhere classified: Secondary | ICD-10-CM

## 2023-03-17 DIAGNOSIS — I480 Paroxysmal atrial fibrillation: Secondary | ICD-10-CM | POA: Diagnosis not present

## 2023-03-17 DIAGNOSIS — G8929 Other chronic pain: Secondary | ICD-10-CM

## 2023-03-17 DIAGNOSIS — Z8719 Personal history of other diseases of the digestive system: Secondary | ICD-10-CM

## 2023-03-17 DIAGNOSIS — Z8679 Personal history of other diseases of the circulatory system: Secondary | ICD-10-CM | POA: Diagnosis not present

## 2023-03-17 DIAGNOSIS — M4302 Spondylolysis, cervical region: Secondary | ICD-10-CM

## 2023-03-17 DIAGNOSIS — M7711 Lateral epicondylitis, right elbow: Secondary | ICD-10-CM

## 2023-03-17 DIAGNOSIS — M19042 Primary osteoarthritis, left hand: Secondary | ICD-10-CM

## 2023-03-17 DIAGNOSIS — I5033 Acute on chronic diastolic (congestive) heart failure: Secondary | ICD-10-CM

## 2023-03-17 DIAGNOSIS — G4733 Obstructive sleep apnea (adult) (pediatric): Secondary | ICD-10-CM

## 2023-03-17 NOTE — Progress Notes (Unsigned)
Cardiology Office Note:    Date:  03/26/2023   ID:  Kara Bush, DOB 01-Jan-1941, MRN [id number]  PCP:  Jackelyn Poling, DO   [facility] Providers Cardiologist:  Chrystie Nose, MD Electrophysiologist:  Lanier Prude, MD      Referring MD: Jackelyn Poling, DO   Follow-up for atrial fibrillation and lower extremity edema  History of Present Illness:    Kara Bush is a 82 y.o. female with a hx of  coronary artery disease status post CABG in 1998, PAD status post iliac bypass, TIA, PE 2/14, paroxysmal atrial fibrillation diagnosed in 2/20, HLD, and HTN.  Her echocardiogram 1/19 showed an EF 60-65%, G1 DD, mild AI, and mild MR.        She was seen by Dr. Rennis Golden 12/21.  During that time her metoprolol succinate was increased to 25 mg daily.  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    She presented to the emergency department at [facility] on 02/26/2021 and was discharged on 02/28/2021.  She presented with A. fib RVR.  She reported that she had been noticing atrial fibrillation on her apple watch for the past week.  She reported heart rates in the 130-150 range.  On arrival to [facility] heart rate was 110-130.  Her serial troponins were negative.  Her BNP was borderline at 263.1.  She was negative for COVID.  Her chest x-ray showed trace amount of fluid within minor fissure and was similar to prior studies.  Her EKG showed atrial flutter rather than atrial fibrillation.  She received a dose of metoprolol tartrate 25 mg in the emergency room.  She converted back to sinus rhythm.  By the time cardiology service examined the patient she had been in sinus rhythm for 12 hours.  She reported compliance with her apixaban.  Her metoprolol succinate was increased to 50 mg daily with instructions to take an additional 25 mg as needed.  Her echocardiogram 02/27/2021 showed an EF of 55-60%, moderate LVH, mild  mitral valve regurgitation, trivial tricuspid valve regurgitation, and mild dilation of the ascending aortic root at 38 mm.     She presented to the clinic 03/28/21 for follow-up evaluation stated she felt well.  She had 2 episodes of increased heart rate over the previous month.  They were brief episodes and she returned to regular rate without intervention.  She reported that her blood pressure over the last 2 weeks had been elevated.  She was noticing blood pressures in the 150-160/80 range at home.  Initially in the office today her blood pressure was 160/80 and on follow-up it was 146/78.  She reported dietary indiscretion.  She did not have interest in changing her diet.  She also reported that she was previously a patient of Dr. Mayford Knife and wore CPAP.  She had not worn her CPAP in a few years.  Her initial sleep evaluation she believed was over 13 years ago.  I ordered repeat  sleep evaluation, asked her to increase her physical activity as tolerated, gave the salty 6 diet sheet, increased her valsartan, and planned follow-up after her sleep study.  She was seen by Dr. Mayford Knife on 08/15/2021.  At that time she was doing well with her CPAP.  She felt that she had gotten used to the device.  She was tolerating her mask and felt the pressure was appropriate.  She reported feeling  rested and denies daytime sleepiness.      She presented to the clinic 10/02/21 for follow-up evaluation stated she had noticed that she had lower extremity swelling.  She felt that her weight was up around 10 pounds.  She reported dietary indiscretion.  She reported that she continued to eat the salty foods that she enjoyed.  We reviewed the importance of daily weights and elevating her lower extremities.  She reported compliance with her CPAP.  Her blood pressures at home had been somewhat elevated.  I will prescribe furosemide and supplemental potassium.  I planned repeat  BMP in 1 week and planned follow-up in 1 month.  She was  previously hospitalized for CVA which was complicated by AKI.  During that time her furosemide was decreased and her Sherryll Burger was held at discharge.  She presented to the emergency department on 03/02/2023 and was discharged on 03/07/2023.  She presented with progressive lower extremity swelling shortness of breath and increased oxygen needs.  In the emergency department she required 4 L of oxygen to maintain 90% oxygen saturation.  She was noted to have elevated blood pressure and elevated BNP.  She was started on furosemide and admitted for CHF.  She diuresed 8 L during admission.  Her Sherryll Burger was resumed along with her Imdur and metoprolol.  Cardiology also reviewed need for Eliquis and she was restarted on her Eliquis therapy.  Spironolactone was also added to her medication regimen.  BMP in 1 week was recommended.  BMP on 03/07/2023 showed stable creatinine and electrolytes.  She presents to the clinic today for follow-up evaluation and states she feels well.  She does have some back and right leg pain.  She takes hydrocodone for this.  We reviewed her most recent hospitalization.  She and her daughter expressed understanding.  She had recent lab work drawn at [facility].  Her blood pressure today is 114/44.  On recheck her blood pressure is 114/50.  She has noticed some dizziness with standing.  She has been maintaining her weight and is noted to be 171 pounds today.  We reviewed the importance of daily weights and low-sodium diet.  She expressed understanding.  We established a goal weight of around 170 pounds.  I will give her a weight log, refill her cardiac medications, reduce her hydralazine to 10 mg 3 times daily and plan follow-up as scheduled with Dr. Rennis Golden.  She is moving to assisted living tomorrow.     Today she denies chest pain, shortness of breath, increased fatigue, palpitations, melena, hematuria, hemoptysis, diaphoresis, weakness, presyncope, syncope, orthopnea, and PND.  Past  Medical History:  Diagnosis Date   Abdominal aortic aneurysm (HCC)    Anemia, improved 01/11/2013   Basal cell carcinoma    CAD (coronary artery disease)    hx CABG 1998, last cath 2007 with PCI and stenting to the proximal segment of the vein graft to the diagonal branch. DES Taxus was placed   Cervicalgia    Chronic fatigue fibromyalgia syndrome    Chronic pain syndrome    Depression    Fibromyalgia    History of nuclear stress test 08/07/2011   lexiscan; no evidence of ischemia or infarct; low risk    Hyperlipidemia    Hypertension    OSA (obstructive sleep apnea)    PAD (peripheral artery disease) (HCC)    Pes anserine bursitis    Pulmonary embolism (HCC) 01/11/2013   S/P resection of aortic aneurysm    Trochanteric bursitis  Ulcerative colitis [facility])     Past Surgical History:  Procedure Laterality Date   AORTOBIEXTERNAL ILIAC BYPASS  1991   BIOPSY  11/25/2022   Procedure: BIOPSY;  Surgeon: Jeani Hawking, MD;  Location: [facility] ENDOSCOPY;  Service: Gastroenterology;;   CATARACT EXTRACTION Left 09/2019   CHOLECYSTECTOMY  1997   COLONOSCOPY WITH PROPOFOL N/A 11/25/2022   Procedure: COLONOSCOPY WITH PROPOFOL;  Surgeon: Jeani Hawking, MD;  Location: [facility] ENDOSCOPY;  Service: Gastroenterology;  Laterality: N/A;   CORONARY ANGIOPLASTY WITH STENT PLACEMENT  2006   to LAD, ATRETIC LIMA AT THAT TIME ALSO   CORONARY ANGIOPLASTY WITH STENT PLACEMENT  2007   STENT TO PROX VG-DIAG, OTHER STENTS PATENT   CORONARY ARTERY BYPASS GRAFT  1998   LIMA-LAD;VG-DIAG & RCA   CORONARY STENT PLACEMENT  2005   TO LAD & LCX-STENTS,   ESOPHAGOGASTRODUODENOSCOPY (EGD) WITH PROPOFOL N/A 11/25/2022   Procedure: ESOPHAGOGASTRODUODENOSCOPY (EGD) WITH PROPOFOL;  Surgeon: Jeani Hawking, MD;  Location: [facility] ENDOSCOPY;  Service: Gastroenterology;  Laterality: N/A;   HERNIA REPAIR     TUBAL LIGATION     BTL    Current Medications: Current Meds  Medication Sig   acetaminophen (TYLENOL) 500 MG tablet Take 500  mg by mouth as needed.   apixaban (ELIQUIS) 5 MG TABS tablet Take 1 tablet (5 mg total) by mouth 2 (two) times daily.   atorvastatin (LIPITOR) 40 MG tablet Take 1 tablet (40 mg total) by mouth daily.   b complex vitamins capsule Take 1 capsule by mouth daily.   furosemide (LASIX) 40 MG tablet Take 1 tablet (40 mg total) by mouth every Monday, Wednesday, and Friday.   hydrALAZINE (APRESOLINE) 25 MG tablet Take 1 tablet (25 mg total) by mouth 3 (three) times daily.   HYDROcodone-acetaminophen (NORCO/VICODIN) 5-325 MG tablet Take 1 tablet by mouth 4 (four) times daily as needed for moderate pain. May Take an extra 0.5 to one tablet when pain is severe.   isosorbide mononitrate (IMDUR) 30 MG 24 hr tablet Take 1 tablet (30 mg total) by mouth daily.   metoprolol succinate (TOPROL-XL) 25 MG 24 hr tablet Take 3 tablets (75 mg total) by mouth daily. Take with or immediately following a meal.   Multiple Vitamin (MULITIVITAMIN WITH MINERALS) TABS Take 1 tablet by mouth daily.   ondansetron (ZOFRAN-ODT) 4 MG disintegrating tablet Take 1 tablet (4 mg total) by mouth every 8 (eight) hours.   pantoprazole (PROTONIX) 40 MG tablet Take 1 tablet (40 mg total) by mouth 2 (two) times daily.   potassium chloride (KLOR-CON M) 10 MEQ tablet Take 1 tablet (10 mEq total) by mouth every Monday, Wednesday, and Friday.   sacubitril-valsartan (ENTRESTO) 24-26 MG Take 1 tablet by mouth 2 (two) times daily.   spironolactone (ALDACTONE) 25 MG tablet Take 1 tablet (25 mg total) by mouth daily.     Allergies:   Biotin, Lisinopril, Atenolol, Duloxetine hcl, Lyrica [pregabalin], Penicillin g, Penicillins, and Sulfamethoxazole-trimethoprim   Social History   Socioeconomic History   Marital status: Divorced    Spouse name: Not on file   Number of children: 2   Years of education: college    Highest education level: Not on file  Occupational History    Employer: RETIRED  Tobacco Use   Smoking status: Former    Current  packs/day: 0.00    Average packs/day: 2.5 packs/day for 20.0 years (50.0 ttl pk-yrs)    Types: Cigarettes    Start date: 06/29/1970    Quit date: 06/29/1990  Years since quitting: 32.7    Passive exposure: Never   Smokeless tobacco: Never  Vaping Use   Vaping status: Never Used  Substance and Sexual Activity   Alcohol use: No   Drug use: No   Sexual activity: Not on file  Other Topics Concern   Not on file  Social History Narrative   Not on file   Social Determinants of Health   Financial Resource Strain: Not on file  Food Insecurity: No Food Insecurity (03/02/2023)   Hunger Vital Sign    Worried About Running Out of Food in the Last Year: Never true    Ran Out of Food in the Last Year: Never true  Transportation Needs: No Transportation Needs (03/02/2023)   PRAPARE - Administrator, Civil Service (Medical): No    Lack of Transportation (Non-Medical): No  Physical Activity: Not on file  Stress: Not on file  Social Connections: Not on file     Family History: The patient's family history includes Cancer in her brother and paternal aunt; Healthy in her daughter, daughter, and sister; Heart attack in her father, maternal grandfather, and paternal grandmother; Heart disease in her father; Heart failure in her maternal grandmother and mother.  ROS:   Please see the history of present illness.     All other systems reviewed and are negative.   Risk Assessment/Calculations:           Physical Exam:    VS:  BP (!) 114/50   Pulse 87   Ht 5\' 3"  (1.6 m)   Wt 171 lb (77.6 kg)   SpO2 91%   BMI 30.29 kg/m     Wt Readings from Last 3 Encounters:  03/26/23 171 lb (77.6 kg)  03/17/23 173 lb 6.4 oz (78.7 kg)  03/07/23 187 lb 1.6 oz (84.9 kg)     GEN:  Well nourished, well developed in no acute distress HEENT: Normal NECK: No JVD; No carotid bruits LYMPHATICS: No lymphadenopathy CARDIAC: RRR, no murmurs, rubs, gallops, no edema RESPIRATORY:  Clear to  auscultation without rales, wheezing or rhonchi  ABDOMEN: Soft, non-tender, non-distended MUSCULOSKELETAL:  No edema; No deformity  SKIN: Warm and dry NEUROLOGIC:  Alert and oriented x 3 PSYCHIATRIC:  Normal affect    EKGs/Labs/Other Studies Reviewed:    The following studies were reviewed today:  Echocardiogram 02/27/2021 IMPRESSIONS     1. Left ventricular ejection fraction, by estimation, is 55 to 60%. The  left ventricle has normal function. The left ventricle has no regional  wall motion abnormalities. There is moderate left ventricular hypertrophy.  Left ventricular diastolic  parameters were normal.   2. Right ventricular systolic function is normal. The right ventricular  size is normal. There is mildly elevated pulmonary artery systolic  pressure. The estimated right ventricular systolic pressure is 36.9 mmHg.   3. The mitral valve is normal in structure. Mild mitral valve  regurgitation.   4. The aortic valve is tricuspid. There is moderate calcification of the  aortic valve. Aortic valve regurgitation is mild. Mild to moderate aortic  valve stenosis. Vmax 2.6 m/s, MG 15 mmHg, AVA 1.0 cm^2, DI 0.36   5. Aortic dilatation noted. There is mild dilatation of the ascending  aorta, measuring 38 mm.   6. The inferior vena cava is dilated in size with >50% respiratory  variability, suggesting right atrial pressure of 8 mmHg.   FINDINGS   Left Ventricle: Left ventricular ejection fraction, by estimation, is 55  to 60%.  The left ventricle has normal function. The left ventricle has no  regional wall motion abnormalities. The left ventricular internal cavity  size was normal in size. There is   moderate left ventricular hypertrophy. Left ventricular diastolic  parameters were normal.   Right Ventricle: The right ventricular size is normal. No increase in  right ventricular wall thickness. Right ventricular systolic function is  normal. There is mildly elevated pulmonary  artery systolic pressure. The  tricuspid regurgitant velocity is 2.69   m/s, and with an assumed right atrial pressure of 8 mmHg, the estimated  right ventricular systolic pressure is 36.9 mmHg.   Left Atrium: Left atrial size was normal in size.   Right Atrium: Right atrial size was normal in size.   Pericardium: There is no evidence of pericardial effusion.   Mitral Valve: The mitral valve is normal in structure. Mild mitral valve  regurgitation.   Tricuspid Valve: The tricuspid valve is normal in structure. Tricuspid  valve regurgitation is trivial.   Aortic Valve: The aortic valve is tricuspid. There is moderate  calcification of the aortic valve. Aortic valve regurgitation is mild.  Aortic regurgitation PHT measures 455 msec. Mild to moderate aortic  stenosis is present. Aortic valve mean gradient  measures 14.0 mmHg. Aortic valve peak gradient measures 24.0 mmHg. Aortic  valve area, by VTI measures 0.97 cm.   Pulmonic Valve: The pulmonic valve was not well visualized. Pulmonic valve  regurgitation is not visualized.   Aorta: The aortic root is normal in size and structure and aortic  dilatation noted. There is mild dilatation of the ascending aorta,  measuring 38 mm.   Venous: The inferior vena cava is dilated in size with greater than 50%  respiratory variability, suggesting right atrial pressure of 8 mmHg.   IAS/Shunts: No atrial level shunt detected by color flow Doppler.   Echocardiogram 08/14/2021  IMPRESSIONS     1. Left ventricular ejection fraction, by estimation, is 55 to 60%. The  left ventricle has normal function. The left ventricle has no regional  wall motion abnormalities. Left ventricular diastolic parameters are  consistent with Grade II diastolic  dysfunction (pseudonormalization).   2. Right ventricular systolic function is normal. The right ventricular  size is normal. There is normal pulmonary artery systolic pressure. The  estimated right  ventricular systolic pressure is 35.3 mmHg.   3. The mitral valve is normal in structure. Trivial mitral valve  regurgitation. No evidence of mitral stenosis.   4. 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  Aortic valve regurgitation is mild. Probably paradoxical low flow/low  gradient moderate aortic stenosis. Aortic valve area, by VTI measures 1.03  cm. Aortic valve mean gradient  measures 14.0 mmHg.   5. The inferior vena cava is normal in size with greater than 50%  respiratory variability, suggesting right atrial pressure of 3 mmHg.  Echo 02/05/23 IMPRESSIONS     1. Left ventricular ejection fraction, by estimation, is 55 to 60%. The  left ventricle has normal function. The left ventricle has no regional  wall motion abnormalities. Left ventricular diastolic parameters are  indeterminate.   2. Right ventricular systolic function is normal. The right ventricular  size is normal. There is normal pulmonary artery systolic pressure.   3. Left atrial size was mildly dilated.   4. The mitral valve is degenerative. Mild mitral valve regurgitation. No  evidence of mitral stenosis.   5. Partial fusion of right and left cusps. The aortic  valve is tricuspid.  There is moderate calcification of the aortic valve. There is moderate  thickening of the aortic valve. Aortic valve regurgitation is mild.  Moderate aortic valve stenosis.   6. The inferior vena cava is normal in size with greater than 50%  respiratory variability, suggesting right atrial pressure of 3 mmHg.   FINDINGS   Left Ventricle: Left ventricular ejection fraction, by estimation, is 55  to 60%. The left ventricle has normal function. The left ventricle has no  regional wall motion abnormalities. The left ventricular internal cavity  size was normal in size. There is   no left ventricular hypertrophy. Left ventricular diastolic parameters  are indeterminate.   Right Ventricle: The right  ventricular size is normal. No increase in  right ventricular wall thickness. Right ventricular systolic function is  normal. There is normal pulmonary artery systolic pressure. The tricuspid  regurgitant velocity is 2.69 m/s, and   with an assumed right atrial pressure of 3 mmHg, the estimated right  ventricular systolic pressure is 31.9 mmHg.   Left Atrium: Left atrial size was mildly dilated.   Right Atrium: Right atrial size was normal in size.   Pericardium: There is no evidence of pericardial effusion.   Mitral Valve: The mitral valve is degenerative in appearance. There is  mild thickening of the mitral valve leaflet(s). There is mild  calcification of the mitral valve leaflet(s). Mild mitral valve  regurgitation. No evidence of mitral valve stenosis.   Tricuspid Valve: The tricuspid valve is normal in structure. Tricuspid  valve regurgitation is not demonstrated. No evidence of tricuspid  stenosis.   Aortic Valve: Partial fusion of right and left cusps. The aortic valve is  tricuspid. There is moderate calcification of the aortic valve. There is  moderate thickening of the aortic valve. Aortic valve regurgitation is  mild. Moderate aortic stenosis is  present. Aortic valve mean gradient measures 16.5 mmHg. Aortic valve peak  gradient measures 27.8 mmHg. Aortic valve area, by VTI measures 1.19 cm.   Pulmonic Valve: The pulmonic valve was normal in structure. Pulmonic valve  regurgitation is mild. No evidence of pulmonic stenosis.   Aorta: The aortic root is normal in size and structure.   Venous: The inferior vena cava is normal in size with greater than 50%  respiratory variability, suggesting right atrial pressure of 3 mmHg.   IAS/Shunts: No atrial level shunt detected by color flow Doppler.    CT angio chest 09/10/20 COMPARISON:  CTA chest 09/09/2019   FINDINGS: Cardiovascular: Fusiform aneurysmal dilation of the ascending thoracic aorta with a maximal diameter  of 4.1 cm, minimally increased compared to 4.0 cm previously. Surgical changes of prior median sternotomy with multivessel CABG. The heart is normal in size. No pericardial effusion. Calcifications are present on the coronary arteries. The aortic valve is mildly thickened and calcified. Conventional 3 vessel arch anatomy. Atherosclerotic plaque at the origin of the left subclavian artery results in mild stenosis. Calcifications extend throughout the descending thoracic aorta.   Mediastinum/Nodes: Unremarkable CT appearance of the thyroid gland. No suspicious mediastinal or hilar adenopathy. No soft tissue mediastinal mass. The thoracic esophagus is unremarkable.   Lungs/Pleura: Small amount of fluid trapped within the minor fissure. The lungs are otherwise clear. No pleural effusion or pneumothorax.   Upper Abdomen: No acute abnormality within the visualized upper abdomen. Stable dilation of the common bile duct up to 1.4 cm. The gallbladder is surgically absent.   Musculoskeletal: No acute fracture or aggressive appearing lytic  or blastic osseous lesion.   Review of the MIP images confirms the above findings.   IMPRESSION: 1. Stable to incrementally enlarged fusiform aneurysm of the ascending thoracic aorta with a maximal diameter of 4.1 cm compared to 4.0 cm previously. Recommend annual imaging followup by CTA or MRA. This recommendation follows 2010 ACCF/AHA/AATS/ACR/ASA/SCA/SCAI/SIR/STS/SVM Guidelines for the Diagnosis and Management of Patients with Thoracic Aortic Disease. Circulation. 2010; 121: G956-O130. Aortic aneurysm NOS (ICD10-I71.9); Aortic Atherosclerosis (ICD10-I70.0). 2. Thickened and calcified aortic valve suggests underlying aortic stenosis. 3. Coronary artery disease status post multivessel CABG. 4. Small amount of fluid within the minor fissure. 5. Stable chronic dilation of the common bile duct.  EKG:  EKG is  ordered today.  The ekg ordered today  demonstrates sinus rhythm with occasional premature ventricular complexes and premature atrial complexes septal infarct undetermined age 21 bpm  Recent Labs: 02/05/2023: TSH 0.733 03/02/2023: B Natriuretic Peptide 671.2 03/03/2023: ALT 13 03/06/2023: Magnesium 1.5 03/07/2023: BUN 23; Creatinine, Ser 1.45; Hemoglobin 8.3; Platelets 211; Potassium 3.8; Sodium 137  Recent Lipid Panel    Component Value Date/Time   CHOL 144 02/06/2023 0259   CHOL 141 10/13/2019 0817   TRIG 92 02/06/2023 0259   HDL 46 02/06/2023 0259   HDL 56 10/13/2019 0817   CHOLHDL 3.1 02/06/2023 0259   VLDL 18 02/06/2023 0259   LDLCALC 80 02/06/2023 0259   LDLCALC 63 10/13/2019 0817    ASSESSMENT & PLAN    Acute on chronic diastolic CHF-weight stable.  Euvolemic.  Goal wt 170. NYHA class II.  Recent admission for CHF exacerbation.  Received IV diuresis.  GDMT was restarted. Reviewed heart healthy low-sodium diet Daily weights-contact office with a weight increase of 2 to 3 pounds overnight or 5 pounds in 1 week. Fluid restriction Lower extremity support stockings Labs-BMP from [facility], request Continue Entresto, furosemide, spironolactone, metoprolol Plan for up titration of GDMT   Atrial flutter/fibrillation-continues to be cardiac unaware.  Heart rate today 87 bpm.  She reports compliance with her apixaban.  Continue metoprolol, apixaban Heart healthy low-sodium diet-salty 6 reviewed Avoid triggers caffeine, chocolate, EtOH, dehydration etc.  Essential hypertension-BP today 1 114/50 Maintain blood pressure log Reduce hydralazine to 10 mg 3 times daily and continue other blood pressure therapy Heart healthy low-sodium diet-salty 6 reviewed Increase physical activity as tolerated  OSA-continues to be compliant with CPAP.  Following with Dr. Mayford Knife.  Waking up well rested.     Continue CPAP use   Peripheral arterial disease-denies claudication.  Underwent aortic iliac bypass Continue simvastatin, apixaban Heart  healthy low-sodium diet Increase physical activity as tolerated   Hyperlipidemia-LDL 63 10/13/2019 Continue atorvastatin, ezetimibe Heart healthy low-sodium high-fiber diet Increase physical activity as tolerated Request labs from [facility]  Coronary artery disease-denies episodes of chest pain.  Status post CABG 1998. Continue simvastatin, metoprolol, ezetimibe Heart healthy low-sodium diet Increase physical activity as tolerated  Thoracic aortic aneurysm-follow-up CT 09/10/2020 showed stable aneurysm.  Details above.    Reports good blood pressure control at home.    Disposition: Follow-up with Dr. Rennis Golden or me in 3-4 months.        Medication Adjustments/Labs and Tests Ordered: Current medicines are reviewed at length with the patient today.  Concerns regarding medicines are outlined above.  No orders of the defined types were placed in this encounter.  No orders of the defined types were placed in this encounter.   There are no Patient Instructions on file for this visit.   Signed, Ronney Asters, NP  03/26/2023 9:19 AM      Notice: This dictation was prepared with Dragon dictation along with smaller phrase technology. Any transcriptional errors that result from this process are unintentional and may not be corrected upon review.  I spent 14 minutes examining this patient, reviewing medications, and using patient centered shared decision making involving her cardiac care.  Prior to her visit I spent greater than 20 minutes reviewing her past medical history,  medications, and prior cardiac tests.

## 2023-03-17 NOTE — Addendum Note (Signed)
Addended by: Geroge Baseman C on: 03/17/2023 11:54 AM   Modules accepted: Orders

## 2023-03-18 DIAGNOSIS — R300 Dysuria: Secondary | ICD-10-CM | POA: Diagnosis not present

## 2023-03-22 ENCOUNTER — Other Ambulatory Visit: Payer: Self-pay | Admitting: Student

## 2023-03-23 ENCOUNTER — Telehealth: Payer: Self-pay | Admitting: Internal Medicine

## 2023-03-23 ENCOUNTER — Telehealth: Payer: Self-pay | Admitting: Student

## 2023-03-23 MED ORDER — ATORVASTATIN CALCIUM 40 MG PO TABS: 40.0000 mg | ORAL_TABLET | Freq: Every day | ORAL | 3 refills | Status: DC

## 2023-03-23 MED ORDER — METOPROLOL SUCCINATE ER 25 MG PO TB24: 75.0000 mg | ORAL_TABLET | Freq: Every day | ORAL | 3 refills | Status: DC

## 2023-03-23 NOTE — Telephone Encounter (Signed)
STAT if HR is under 50 or over 120 (normal HR is 60-100 beats per minute)  What is your heart rate? 120-130  Do you have a log of your heart rate readings (document readings)? 125  Do you have any other symptoms? Patient's daughter is calling because while resting her heart rate is running around 120-130. Patient's daughter states the patient does not feel good and is in afib frequently.

## 2023-03-23 NOTE — Telephone Encounter (Signed)
Spoke with daughter per DPR and she states for the last 3 days patient has been in AFIB. Heart rate high as 125. Patient has not had metoprolol in 3 days refill was sent today. Patient is asymptomatic. Advised to pick up metoprolol from and take as prescribed. She is taking her Eliquis. Continue to monitor heart rate and taking medications as prescribed. ED precautions discussed. Will forward to provider

## 2023-03-23 NOTE — Telephone Encounter (Signed)
Pt's medication was sent to pt's pharmacy as requested. Confirmation received.  °

## 2023-03-23 NOTE — Telephone Encounter (Signed)
 *  STAT* If patient is at the pharmacy, call can be transferred to refill team.   1. Which medications need to be refilled? (please list name of each medication and dose if known) atorvastatin (LIPITOR) 40 MG tablet    metoprolol succinate (TOPROL-XL) 25 MG 24 hr tablet     2. Would you like to learn more about the convenience, safety, & potential cost savings by using the [facility]? No      3. Are you open to using the [facility] (Type [facility]. No    4. Which pharmacy/location (including street and city if local pharmacy) is medication to be sent to?  [id number] - [place], [place] - [address]     5. Do they need a 30 day or 90 day supply? 90 days   Pt is out of meds, need refill today

## 2023-03-24 DIAGNOSIS — Z111 Encounter for screening for respiratory tuberculosis: Secondary | ICD-10-CM | POA: Diagnosis not present

## 2023-03-25 ENCOUNTER — Telehealth: Payer: Self-pay | Admitting: Physician Assistant

## 2023-03-25 ENCOUNTER — Telehealth: Payer: Self-pay | Admitting: *Deleted

## 2023-03-25 DIAGNOSIS — F3341 Major depressive disorder, recurrent, in partial remission: Secondary | ICD-10-CM | POA: Diagnosis not present

## 2023-03-25 DIAGNOSIS — G8929 Other chronic pain: Secondary | ICD-10-CM | POA: Diagnosis not present

## 2023-03-25 DIAGNOSIS — G4700 Insomnia, unspecified: Secondary | ICD-10-CM | POA: Diagnosis not present

## 2023-03-25 DIAGNOSIS — Z0289 Encounter for other administrative examinations: Secondary | ICD-10-CM | POA: Diagnosis not present

## 2023-03-25 DIAGNOSIS — M545 Low back pain, unspecified: Secondary | ICD-10-CM | POA: Diagnosis not present

## 2023-03-25 NOTE — Telephone Encounter (Signed)
Pt would like to speak with Kara Bush regarding the pain in her back and sciatic nerve, and what other treatment would be available. Call back number [phone]. Asked pt if she would like to speak with the nurse and pt refused.

## 2023-03-25 NOTE — Telephone Encounter (Signed)
Patient requesting a call back about augmentation of pain meds. [phone] or [phone].

## 2023-03-25 NOTE — Telephone Encounter (Signed)
Patient advised we can place an urgent referral to the spin specialist.  Patient states she is not experiencing any muscle spasms. Patient advised if the pain gets to be intolerable before she is able to see the spine specialist she can be seen at [facility]. Patient expressed understanding.

## 2023-03-25 NOTE — Telephone Encounter (Signed)
LMOM returning patient's call.

## 2023-03-25 NOTE — Telephone Encounter (Signed)
Patient returned call to the office. Patient states her back pain is not improving, it is getting worse. Patient states she does not see PT until September 5th. Patient would like to know what she can do in the meantime. Patient states she is taking Hydrocodone. Patient states she receives this from the pain clinic. Patient states she has put a call into them as well to see if they would approve her to increase her Hydrocodone. Patient states she is moving into an assisted living facility on Friday and is trying to get a handle on the pain prior to that. Please advise.

## 2023-03-26 ENCOUNTER — Encounter: Payer: Self-pay | Admitting: General Practice

## 2023-03-26 ENCOUNTER — Ambulatory Visit: Payer: PPO | Attending: General Practice | Admitting: General Practice

## 2023-03-26 VITALS — BP 114/50 | HR 87 | Ht 63.0 in | Wt 171.0 lb

## 2023-03-26 DIAGNOSIS — I5033 Acute on chronic diastolic (congestive) heart failure: Secondary | ICD-10-CM | POA: Diagnosis not present

## 2023-03-26 DIAGNOSIS — I739 Peripheral vascular disease, unspecified: Secondary | ICD-10-CM

## 2023-03-26 DIAGNOSIS — G4733 Obstructive sleep apnea (adult) (pediatric): Secondary | ICD-10-CM | POA: Diagnosis not present

## 2023-03-26 DIAGNOSIS — I10 Essential (primary) hypertension: Secondary | ICD-10-CM

## 2023-03-26 DIAGNOSIS — I2510 Atherosclerotic heart disease of native coronary artery without angina pectoris: Secondary | ICD-10-CM | POA: Diagnosis not present

## 2023-03-26 DIAGNOSIS — H02052 Trichiasis without entropian right lower eyelid: Secondary | ICD-10-CM | POA: Diagnosis not present

## 2023-03-26 DIAGNOSIS — I712 Thoracic aortic aneurysm, without rupture, unspecified: Secondary | ICD-10-CM | POA: Diagnosis not present

## 2023-03-26 DIAGNOSIS — I480 Paroxysmal atrial fibrillation: Secondary | ICD-10-CM

## 2023-03-26 MED ORDER — ATORVASTATIN CALCIUM 40 MG PO TABS: 40.0000 mg | ORAL_TABLET | Freq: Every day | ORAL | 1 refills | Status: DC

## 2023-03-26 MED ORDER — ISOSORBIDE MONONITRATE ER 30 MG PO TB24: 30.0000 mg | ORAL_TABLET | Freq: Every day | ORAL | 1 refills | Status: DC

## 2023-03-26 MED ORDER — SACUBITRIL-VALSARTAN 24-26 MG PO TABS: 1.0000 | ORAL_TABLET | Freq: Two times a day (BID) | ORAL | 1 refills | Status: DC

## 2023-03-26 MED ORDER — METOPROLOL SUCCINATE ER 25 MG PO TB24: 75.0000 mg | ORAL_TABLET | Freq: Every day | ORAL | 1 refills | Status: DC

## 2023-03-26 MED ORDER — SPIRONOLACTONE 25 MG PO TABS: 25.0000 mg | ORAL_TABLET | Freq: Every day | ORAL | 1 refills | Status: DC

## 2023-03-26 MED ORDER — HYDRALAZINE HCL 10 MG PO TABS: 10.0000 mg | ORAL_TABLET | Freq: Three times a day (TID) | ORAL | 1 refills | Status: DC

## 2023-03-26 MED ORDER — APIXABAN 5 MG PO TABS: 5.0000 mg | ORAL_TABLET | Freq: Two times a day (BID) | ORAL | 1 refills | Status: DC

## 2023-03-26 NOTE — Patient Instructions (Addendum)
Medication Instructions:  DECREASE HYDRALAZINE 10MG  DAILY *If you need a refill on your cardiac medications before your next appointment, please call your pharmacy*  Lab Work: NONE If you have labs (blood work) drawn today and your tests are completely normal, you will receive your results only by:  MyChart Message (if you have MyChart) OR  A paper copy in the mail If you have any lab test that is abnormal or we need to change your treatment, we will call you to review the results.  Testing/Procedures: NONE  Follow-Up: At [facility], you and your health needs are our priority.  As part of our continuing mission to provide you with exceptional heart care, we have created designated Provider Care Teams.  These Care Teams include your primary Cardiologist (physician) and Advanced Practice Providers (APPs -  Physician Assistants and Nurse Practitioners) who all work together to provide you with the care you need, when you need it.  Your next appointment:   KEEP SCHEDULED APPOINTMENT   Provider:   Chrystie Nose, MD     Other Instructions TAKE AND LOG YOUR WEIGHT AND BLOOD PRESSURE DAILY PLEASE READ AND FOLLOW ATTACHED  SALTY 6

## 2023-04-01 NOTE — Telephone Encounter (Signed)
Lumbar X-ray: 03/17/2023  Impression: These findings are consistent with multilevel spondylosis and  facet joint arthropathy.   She states she has a scheduled appointment with a Spine doctor.

## 2023-04-01 NOTE — Telephone Encounter (Signed)
Return Ms. Bold call, she reports increase intensity of lower back pain, and only receiving 2-3 hours of relief with her current medication regimen. Will increase Hydrocodone to 5 times a day as needed for pain. She will call in two weeks with a update. She verbalizes understanding.

## 2023-04-02 DIAGNOSIS — I2489 Other forms of acute ischemic heart disease: Secondary | ICD-10-CM | POA: Diagnosis not present

## 2023-04-02 DIAGNOSIS — I5042 Chronic combined systolic (congestive) and diastolic (congestive) heart failure: Secondary | ICD-10-CM | POA: Diagnosis not present

## 2023-04-02 DIAGNOSIS — I214 Non-ST elevation (NSTEMI) myocardial infarction: Secondary | ICD-10-CM | POA: Diagnosis not present

## 2023-04-02 DIAGNOSIS — G4733 Obstructive sleep apnea (adult) (pediatric): Secondary | ICD-10-CM | POA: Diagnosis not present

## 2023-04-03 ENCOUNTER — Telehealth: Payer: Self-pay | Admitting: Internal Medicine

## 2023-04-03 NOTE — Telephone Encounter (Signed)
Pt c/o BP issue: STAT if pt c/o blurred vision, one-sided weakness or slurred speech  1. What are your last 5 BP readings?   100's/50's-60's  2. Are you having any other symptoms (ex. Dizziness, headache, blurred vision, passed out)? Dizziness and headache   3. What is your BP issue? Pt states her bp has been low and she wants to know what she should do. Please advise.

## 2023-04-03 NOTE — Telephone Encounter (Signed)
Returned patient call in regards to her BP. No chest pain or pressure, sob, swelling, blurred vision. Her BP today was 96/66 HR 88. Yesterday her BP 99/53 HR 82, BP 102/54 HR 83. She goes in and out of Afib according to her watch.    Is dizzy, lightheadedness and slight headedness. She is concerned of these readings and would like advice.

## 2023-04-07 NOTE — Telephone Encounter (Signed)
Returned call to patient and LVM to return our call.

## 2023-04-08 DIAGNOSIS — L905 Scar conditions and fibrosis of skin: Secondary | ICD-10-CM | POA: Diagnosis not present

## 2023-04-08 DIAGNOSIS — D1801 Hemangioma of skin and subcutaneous tissue: Secondary | ICD-10-CM | POA: Diagnosis not present

## 2023-04-08 DIAGNOSIS — Z85828 Personal history of other malignant neoplasm of skin: Secondary | ICD-10-CM | POA: Diagnosis not present

## 2023-04-08 DIAGNOSIS — L821 Other seborrheic keratosis: Secondary | ICD-10-CM | POA: Diagnosis not present

## 2023-04-08 DIAGNOSIS — L720 Epidermal cyst: Secondary | ICD-10-CM | POA: Diagnosis not present

## 2023-04-08 DIAGNOSIS — D225 Melanocytic nevi of trunk: Secondary | ICD-10-CM | POA: Diagnosis not present

## 2023-04-09 DIAGNOSIS — M5451 Vertebrogenic low back pain: Secondary | ICD-10-CM | POA: Diagnosis not present

## 2023-04-10 NOTE — Telephone Encounter (Signed)
Hilty, Lisette Abu, MD  Cv Div Nl Triage 7 days ago    Reduce the spironolactone to 12.5 mg daily.  Dr H   Patient called Aware of med change per MD Med list updated

## 2023-04-13 DIAGNOSIS — M5451 Vertebrogenic low back pain: Secondary | ICD-10-CM | POA: Diagnosis not present

## 2023-04-15 DIAGNOSIS — M5451 Vertebrogenic low back pain: Secondary | ICD-10-CM | POA: Diagnosis not present

## 2023-04-16 DIAGNOSIS — N814 Uterovaginal prolapse, unspecified: Secondary | ICD-10-CM | POA: Diagnosis not present

## 2023-04-16 DIAGNOSIS — N904 Leukoplakia of vulva: Secondary | ICD-10-CM | POA: Diagnosis not present

## 2023-04-17 ENCOUNTER — Other Ambulatory Visit: Payer: Self-pay | Admitting: General Practice

## 2023-04-17 ENCOUNTER — Ambulatory Visit: Payer: PPO | Admitting: Internal Medicine

## 2023-04-20 ENCOUNTER — Ambulatory Visit: Payer: PPO | Admitting: Neurology

## 2023-04-20 DIAGNOSIS — M5451 Vertebrogenic low back pain: Secondary | ICD-10-CM | POA: Diagnosis not present

## 2023-04-22 ENCOUNTER — Encounter: Payer: Self-pay | Admitting: Registered Nurse

## 2023-04-22 ENCOUNTER — Encounter: Payer: PPO | Attending: Registered Nurse | Admitting: Registered Nurse

## 2023-04-22 VITALS — BP 105/63 | HR 98 | Ht 63.0 in | Wt 167.0 lb

## 2023-04-22 DIAGNOSIS — M5416 Radiculopathy, lumbar region: Secondary | ICD-10-CM | POA: Diagnosis not present

## 2023-04-22 DIAGNOSIS — Z5181 Encounter for therapeutic drug level monitoring: Secondary | ICD-10-CM | POA: Insufficient documentation

## 2023-04-22 DIAGNOSIS — Z79891 Long term (current) use of opiate analgesic: Secondary | ICD-10-CM | POA: Diagnosis not present

## 2023-04-22 DIAGNOSIS — M546 Pain in thoracic spine: Secondary | ICD-10-CM | POA: Insufficient documentation

## 2023-04-22 DIAGNOSIS — M545 Low back pain, unspecified: Secondary | ICD-10-CM | POA: Diagnosis not present

## 2023-04-22 DIAGNOSIS — M7061 Trochanteric bursitis, right hip: Secondary | ICD-10-CM | POA: Diagnosis not present

## 2023-04-22 DIAGNOSIS — G8929 Other chronic pain: Secondary | ICD-10-CM | POA: Diagnosis not present

## 2023-04-22 DIAGNOSIS — G894 Chronic pain syndrome: Secondary | ICD-10-CM | POA: Diagnosis not present

## 2023-04-22 DIAGNOSIS — M797 Fibromyalgia: Secondary | ICD-10-CM | POA: Insufficient documentation

## 2023-04-22 MED ORDER — HYDROCODONE-ACETAMINOPHEN 5-325 MG PO TABS: 1.0000 | ORAL_TABLET | Freq: Four times a day (QID) | ORAL | 0 refills | Status: DC | PRN

## 2023-04-22 MED ORDER — HYDROCODONE-ACETAMINOPHEN 5-325 MG PO TABS
1.0000 | ORAL_TABLET | Freq: Four times a day (QID) | ORAL | 0 refills | Status: DC | PRN
Start: 2023-04-22 — End: 2023-04-22

## 2023-04-22 NOTE — Progress Notes (Signed)
Subjective:    Patient ID: Kara Bush, female    DOB: 1941-07-13, 82 y.o.   MRN: [id number]  HPI: Kara Bush is a 82 y.o. female who returns for follow up appointment for chronic pain and medication refill. She states her pain is located in her mid- lower back pain radiating into her right hip and right lower extremity. She rates her pain 3. Her current exercise regime is receiving physical therapy two days a week, walking and performing stretching exercises.  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   [name] Morphine equivalent is 24.17 MME.   UDS ordered today.      Pain Inventory Average Pain 4 Pain Right Now 3 My pain is intermittent and aching  In the last 24 hours, has pain interfered with the following? General activity 2 Relation with others 0 Enjoyment of life 1 What TIME of day is your pain at its worst? daytime Sleep (in general) Fair  Pain is worse with: walking, bending, and some activites Pain improves with: rest and medication Relief from Meds: 5  Family History  Problem Relation Age of Onset   Heart failure Mother        at 34   Heart disease Father    Heart attack Father        @ 45   Healthy Sister    Cancer Brother        pancreatic    Cancer Paternal Aunt        BREAST   Heart failure Maternal Grandmother    Heart attack Maternal Grandfather    Heart attack Paternal Grandmother        at 84   Healthy Daughter    Healthy Daughter    Social History   Socioeconomic History   Marital status: Divorced    Spouse name: Not on file   Number of children: 2   Years of education: college    Highest education level: Not on file  Occupational History    Employer: RETIRED  Tobacco Use   Smoking status: Former    Current packs/day: 0.00    Average packs/day: 2.5 packs/day  for 20.0 years (50.0 ttl pk-yrs)    Types: Cigarettes    Start date: 06/29/1970    Quit date: 06/29/1990    Years since quitting: 32.8    Passive exposure: Never   Smokeless tobacco: Never  Vaping Use   Vaping status: Never Used  Substance and Sexual Activity   Alcohol use: No   Drug use: No   Sexual activity: Not on file  Other Topics Concern   Not on file  Social History Narrative   Not on file   Social Determinants of Health   Financial Resource Strain: Not on file  Food Insecurity: No Food Insecurity (03/02/2023)   Hunger Vital Sign    Worried About Running Out of Food in the Last Year: Never true    Ran Out of Food in the Last Year: Never true  Transportation Needs: No Transportation Needs (03/02/2023)   PRAPARE - Administrator, Civil Service (Medical): No    Lack of Transportation (Non-Medical): No  Physical Activity: Not on file  Stress: Not on file  Social Connections: Not on file   Past Surgical History:  Procedure Laterality Date   AORTOBIEXTERNAL ILIAC BYPASS  1991  BIOPSY  11/25/2022   Procedure: BIOPSY;  Surgeon: Jeani Hawking, MD;  Location: [facility] ENDOSCOPY;  Service: Gastroenterology;;   CATARACT EXTRACTION Left 09/2019   CHOLECYSTECTOMY  1997   COLONOSCOPY WITH PROPOFOL N/A 11/25/2022   Procedure: COLONOSCOPY WITH PROPOFOL;  Surgeon: Jeani Hawking, MD;  Location: [facility] ENDOSCOPY;  Service: Gastroenterology;  Laterality: N/A;   CORONARY ANGIOPLASTY WITH STENT PLACEMENT  2006   to LAD, ATRETIC LIMA AT THAT TIME ALSO   CORONARY ANGIOPLASTY WITH STENT PLACEMENT  2007   STENT TO PROX VG-DIAG, OTHER STENTS PATENT   CORONARY ARTERY BYPASS GRAFT  1998   LIMA-LAD;VG-DIAG & RCA   CORONARY STENT PLACEMENT  2005   TO LAD & LCX-STENTS,   ESOPHAGOGASTRODUODENOSCOPY (EGD) WITH PROPOFOL N/A 11/25/2022   Procedure: ESOPHAGOGASTRODUODENOSCOPY (EGD) WITH PROPOFOL;  Surgeon: Jeani Hawking, MD;  Location: [facility] ENDOSCOPY;  Service: Gastroenterology;  Laterality: N/A;    HERNIA REPAIR     TUBAL LIGATION     BTL   Past Surgical History:  Procedure Laterality Date   AORTOBIEXTERNAL ILIAC BYPASS  1991   BIOPSY  11/25/2022   Procedure: BIOPSY;  Surgeon: Jeani Hawking, MD;  Location: [facility] ENDOSCOPY;  Service: Gastroenterology;;   CATARACT EXTRACTION Left 09/2019   CHOLECYSTECTOMY  1997   COLONOSCOPY WITH PROPOFOL N/A 11/25/2022   Procedure: COLONOSCOPY WITH PROPOFOL;  Surgeon: Jeani Hawking, MD;  Location: [facility] ENDOSCOPY;  Service: Gastroenterology;  Laterality: N/A;   CORONARY ANGIOPLASTY WITH STENT PLACEMENT  2006   to LAD, ATRETIC LIMA AT THAT TIME ALSO   CORONARY ANGIOPLASTY WITH STENT PLACEMENT  2007   STENT TO PROX VG-DIAG, OTHER STENTS PATENT   CORONARY ARTERY BYPASS GRAFT  1998   LIMA-LAD;VG-DIAG & RCA   CORONARY STENT PLACEMENT  2005   TO LAD & LCX-STENTS,   ESOPHAGOGASTRODUODENOSCOPY (EGD) WITH PROPOFOL N/A 11/25/2022   Procedure: ESOPHAGOGASTRODUODENOSCOPY (EGD) WITH PROPOFOL;  Surgeon: Jeani Hawking, MD;  Location: [facility] ENDOSCOPY;  Service: Gastroenterology;  Laterality: N/A;   HERNIA REPAIR     TUBAL LIGATION     BTL   Past Medical History:  Diagnosis Date   Abdominal aortic aneurysm (HCC)    Anemia, improved 01/11/2013   Basal cell carcinoma    CAD (coronary artery disease)    hx CABG 1998, last cath 2007 with PCI and stenting to the proximal segment of the vein graft to the diagonal branch. DES Taxus was placed   Cervicalgia    Chronic fatigue fibromyalgia syndrome    Chronic pain syndrome    Depression    Fibromyalgia    History of nuclear stress test 08/07/2011   lexiscan; no evidence of ischemia or infarct; low risk    Hyperlipidemia    Hypertension    OSA (obstructive sleep apnea)    PAD (peripheral artery disease) (HCC)    Pes anserine bursitis    Pulmonary embolism (HCC) 01/11/2013   S/P resection of aortic aneurysm    Trochanteric bursitis    Ulcerative colitis (HCC)    There were no vitals taken for this visit.  Opioid  Risk Score:   Fall Risk Score:  `1  Depression screen PHQ 2/9     02/25/2023    1:31 PM 10/17/2022    1:38 PM 08/18/2022    1:15 PM 06/23/2022    1:03 PM 02/19/2022    1:40 PM 10/21/2021   11:08 AM 06/24/2021   10:48 AM  Depression screen PHQ 2/9  Decreased Interest 0 0 0 1 0 0 0  Down, Depressed,  Hopeless 0 0 0 1 0 0 0  PHQ - 2 Score 0 0 0 2 0 0 0    Review of Systems  Musculoskeletal:  Positive for back pain.       Right hip, right leg pain  All other systems reviewed and are negative.      Objective:   Physical Exam Vitals and nursing note reviewed.  Constitutional:      Appearance: Normal appearance.  Cardiovascular:     Rate and Rhythm: Normal rate and regular rhythm.     Pulses: Normal pulses.     Heart sounds: Normal heart sounds.  Pulmonary:     Effort: Pulmonary effort is normal.     Breath sounds: Normal breath sounds.  Musculoskeletal:     Cervical back: Normal range of motion and neck supple.     Comments: Normal Muscle Bulk and Muscle Testing Reveals:  Upper Extremities: Full ROM and Muscle Strength 5/5  Thoracic Paraspinal Tenderness: T-7-T-9 Mainly Right Side  Right  Lumbar Paraspinal Tenderness: L-4-L-5 Lower Extremities: Full ROM and Muscle Strength 5/5 Arises from Chair slowly Narrow Based  Gait     Skin:    General: Skin is warm and dry.  Neurological:     Mental Status: She is alert and oriented to person, place, and time.  Psychiatric:        Mood and Affect: Mood normal.        Behavior: Behavior normal.         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 04/22/2023 2. Fibromyalgia: Continue  Home Exercise Program. Continue to Monitor.04/22/2023 3. Bilateral intermittent hand numbness: Carpal tunnel syndrome versus C6 radiculopathy mild: No complaints today. Continue to Monitor. 04/22/2023 4. Chronic Midline Low  Back Pain/ LBP: Refilled: Norco 5/325mg  # 140 pills--use one pill every 6 hours as needed for pain. A second prescription was e-scribe for the following month 04/22/2023 We will continue the opioid monitoring program, this consists of regular clinic visits, examinations, urine drug screen, pill counts as well as use of [place] Controlled Substance Reporting system. A 12 month History has been reviewed on the [place] Controlled Substance Reporting System on 04/22/2023. 5. Muscle Spasm: Continue current medication regimen with  Robaxin. 04/22/2023 6.Bilateral  Greater Trochanteric Bursitis:Continue to Alternate with heat and ice therapy. Continue current medication regime. 04/22/2023  7. Left Foot pain: No complaints today.Continue with HEP as Tolerated. Continue to Monitor. 04/22/2023 8. Right Lateral Epicondylitis Pain: No complaints Today. Continue HEP as Tolerated. Continue to Monitor. 02/25/2023 9. Right Shoulder Tendonitis:  No complaints today. Continue Current Medication Regimen. Continue to Alternate Ice and Heat Therapy. 02/25/2023 10 Bilateral Chronic Knee Pain: .  Continue HEP as Tolerated. Continue to Monitor. 04/22/2023   F/U in 2 month

## 2023-04-27 ENCOUNTER — Ambulatory Visit: Payer: PPO | Admitting: Registered Nurse

## 2023-04-27 DIAGNOSIS — M5451 Vertebrogenic low back pain: Secondary | ICD-10-CM | POA: Diagnosis not present

## 2023-04-28 DIAGNOSIS — H02052 Trichiasis without entropian right lower eyelid: Secondary | ICD-10-CM | POA: Diagnosis not present

## 2023-04-29 DIAGNOSIS — M5451 Vertebrogenic low back pain: Secondary | ICD-10-CM | POA: Diagnosis not present

## 2023-04-29 LAB — TOXASSURE SELECT,+ANTIDEPR,UR

## 2023-05-03 DIAGNOSIS — I214 Non-ST elevation (NSTEMI) myocardial infarction: Secondary | ICD-10-CM | POA: Diagnosis not present

## 2023-05-03 DIAGNOSIS — G4733 Obstructive sleep apnea (adult) (pediatric): Secondary | ICD-10-CM | POA: Diagnosis not present

## 2023-05-03 DIAGNOSIS — I5042 Chronic combined systolic (congestive) and diastolic (congestive) heart failure: Secondary | ICD-10-CM | POA: Diagnosis not present

## 2023-05-03 DIAGNOSIS — I2489 Other forms of acute ischemic heart disease: Secondary | ICD-10-CM | POA: Diagnosis not present

## 2023-05-04 DIAGNOSIS — M5451 Vertebrogenic low back pain: Secondary | ICD-10-CM | POA: Diagnosis not present

## 2023-05-06 DIAGNOSIS — M5451 Vertebrogenic low back pain: Secondary | ICD-10-CM | POA: Diagnosis not present

## 2023-05-15 ENCOUNTER — Other Ambulatory Visit: Payer: Self-pay | Admitting: General Practice

## 2023-06-02 DIAGNOSIS — I2489 Other forms of acute ischemic heart disease: Secondary | ICD-10-CM | POA: Diagnosis not present

## 2023-06-02 DIAGNOSIS — G4733 Obstructive sleep apnea (adult) (pediatric): Secondary | ICD-10-CM | POA: Diagnosis not present

## 2023-06-02 DIAGNOSIS — I5042 Chronic combined systolic (congestive) and diastolic (congestive) heart failure: Secondary | ICD-10-CM | POA: Diagnosis not present

## 2023-06-02 DIAGNOSIS — I214 Non-ST elevation (NSTEMI) myocardial infarction: Secondary | ICD-10-CM | POA: Diagnosis not present

## 2023-06-08 ENCOUNTER — Telehealth: Payer: Self-pay | Admitting: Rheumatology

## 2023-06-08 NOTE — Telephone Encounter (Signed)
Returned patient's call regarding a bill that she received for $5 and to give her the phone number for billing.  Patient was confused why I returned her call and doesn't remember speaking to anyone regarding her bill.

## 2023-06-12 ENCOUNTER — Ambulatory Visit: Payer: PPO | Attending: Internal Medicine | Admitting: Internal Medicine

## 2023-06-12 ENCOUNTER — Encounter: Payer: Self-pay | Admitting: Internal Medicine

## 2023-06-12 VITALS — BP 90/40 | HR 84 | Ht 63.0 in | Wt 163.8 lb

## 2023-06-12 DIAGNOSIS — I480 Paroxysmal atrial fibrillation: Secondary | ICD-10-CM

## 2023-06-12 DIAGNOSIS — I5042 Chronic combined systolic (congestive) and diastolic (congestive) heart failure: Secondary | ICD-10-CM | POA: Diagnosis not present

## 2023-06-12 DIAGNOSIS — I739 Peripheral vascular disease, unspecified: Secondary | ICD-10-CM

## 2023-06-12 DIAGNOSIS — I2510 Atherosclerotic heart disease of native coronary artery without angina pectoris: Secondary | ICD-10-CM | POA: Diagnosis not present

## 2023-06-12 DIAGNOSIS — I951 Orthostatic hypotension: Secondary | ICD-10-CM

## 2023-06-12 NOTE — Progress Notes (Signed)
06/12/2023   PCP: Jackelyn Poling, DO  CC: Dizziness  HPI: 82 year old white female with a history of coronary artery disease as well as peripheral vascular disease. She had bypass grafting in 1998 and stents in 2005 2006 and 2000 710 negative nuclear stress test in 2013 she also has peripheral arterial disease and history of aortic iliac bypass in 1991. Last ABIs were made of 2013 were normal.  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  Fortunately she was asymptomatic, she was placed on anticoagulation with the Xarelto.   She has done well since that time.  Her last visit with Dr. Rennis Golden was in February and he was planning to recheck d-dimer and if it was still elevated she would need to continue her Xarelto for total of 6 months.  Her d-dimer continues to be elevated at 0.67. Initial d-dimer in February was 1.48.  Venous Dopplers were also done in February which were negative for DVT.    Mrs. Folmer just had a repeat D. dimer which was elevated at 1.48, actually slightly higher than it had been before. She tells a story that over the past several weeks she's had a urinary tract infection and started taking Bactrim for which she is more nauseated and is actually thrown up. She reports some tenderness across her epigastric area as well as the costovertebral angles. She's also had fever, chills and some sweating at night. She is planning on seeing her primary care doctor back in the office this afternoon. She denies any worsening shortness of breath or chest pain. He's had no bleeding complications on Xarelto.  Biak returns today for follow-up. She recently reports some shortness of breath with exertion. She occasionally gets twinges in her chest however it is not reminiscent of her prior angina. Nevertheless her last stress test was 2 years ago. I think a lot of her shortness of  breath is due to weight gain and minimal exercise. She does water exercise but not water aerobics.  I saw Adrija Houchin today in follow-up of her stress test and lower extremity Dopplers. Unfortunately she did not get the lower extremity Dopplers. She did undergo a metabolic stress testing. This showed no circulatory limitation to exercise. The main factors in her shortness of breath seems to be obesity and some pulmonary restriction. Currently she denies any significant leg claudication.  07/03/2106  Mrs. Oaks returns today for follow-up. Overall she's feeling fairly well. She had lower chimney arterial Dopplers a year ago which showed normal ABIs. She gets some occasional pain, burning and itching in her extremities which may be related to fibromyalgia. She denies any worsening chest pain or shortness of breath with exertion.  08/10/2017  Mrs. Waddington was seen today in follow-up.  She says she is fairly stable with regards to her symptoms compared to last year.  She gets short of breath, when doing moderate to heavy exertion.  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  She had slight worsening of her right lower extremity ABI last year but denies any worsening claudication with exertion.  Blood pressures been well controlled at home.  She has a physical with routine blood work in 3 months.  08/17/2018  Mrs. Vestal returns today for routine follow-up.  Is been a year since I last saw her.  She had an echocardiogram which showed a stable dilated aortic root.  We had planned to repeat CT angiography of the aorta around this time.  She also had had lower extremity Dopplers but denies any worsening claudication.  This showed mild bilateral lower extremity arterial disease.  She reports continued exercise and does do water aerobics.  She struggles with fibromyalgia and is on long-term opiate therapy.  Labs from March 2019 showed total  cholesterol 141, HDL 50, LDL 64 and triglycerides 134.  09/09/2018  Mrs. Comp is seen today as an add-on for new A. fib.  I just saw her a couple weeks ago.  At the time she was in sinus rhythm and doing well.  Today she was in for Dopplers and was noted to be in an irregular rhythm.  EKG was performed which shows newly identified atrial fibrillation with controlled ventricular response at 81.  Subsequently she reported that she has been having some episodes of palpitations on and off however it was not clear whether this could have been related to her fibromyalgia or any other disorder.  She is also had a few episodes of presyncope.  06/23/2019  Mrs. Perilli returns today for follow-up of A. fib.  Overall she is feeling well.  Her EKG today shows in sinus rhythm.  She has been tolerating Xarelto however the cost is concerning for her.  She is interested in something else.  We discussed possibly switching over to Eliquis.  Recently she held the Xarelto prior to colonoscopy which was uneventful and then it was restarted.  She is also noted elevated blood pressures recently.  She is only on low-dose lisinopril.  08/17/2019  Ms. Biba is seen today in follow-up.  I last saw her about a couple months ago.  She returns today for follow-up of blood pressure.  I previously increased her lisinopril however she remains hypertensive.  She is done well after transitioning from Xarelto to Eliquis.  She is currently only on low-dose lisinopril 10 mg daily.  04/06/2020  Ms. Tyrer returns today for follow-up.  Overall she is doing well without any specific complaints.  LDL was 63 as of March 2011.  Blood pressure today is 140/72.  She is demonstrating sinus rhythm with first-degree AV block and some PACs and PVCs on her EKG but no evidence of A. fib.  She is on Eliquis and denies any bleeding difficulty.  She does get some occasional breakthrough palpitations.  She is only on low-dose  metoprolol.  04/02/2022  Ms. Diebert returns today for follow-up.  Overall she says she is doing well.  She denies any chest pain but does note she gets short of breath with minimal exertion.  It sounds like she is fairly sedentary.  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  She will have these annually.  She reports on her Apple Watch that she has paroxysmal atrial fibrillation.  I did review this and showed some episodes of A-fib although her EKG today shows sinus rhythm with PACs.  She brought a blood pressure log with her which shows relatively good control of her blood pressures generally around the 130s over 70s.  Blood pressure today was 130/76.  She has not had follow-up lower extremity arterial Dopplers and does have a history of aorto ileal bypass in the past.  She was sent to vascular surgery however they did not follow-up with her recently.  09/25/2022  Ms. Boney returns today for follow-up.  She just had a repeat echo which shows normal LVEF 60 to 65%, mild LVH and mild LAE, mild to moderate MR, severe TR and stable mild to moderate AS with a mean gradient of 16.8 mmHg.  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  Blood pressure appears better controlled than it has in the past.  EKG today shows a normal sinus rhythm.  She denies any recurrent A-fib.  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  She does have an intact right kidney.  She has had some recent rectal bleeding was seen yesterday by Dr. Elnoria Howard and found to have an anal fissure which she is undergoing treatment for.  She appears somewhat pale today but says that she has not been anemic.  Hemoglobin in the fall was 12 g/dL.   06/12/2023  Ms. Gottschall returns today for follow-up.  Recently she has had some dizzy episodes.  She brought a number of blood  pressure readings in with her.  It does show some lability.  She is lost 20 pounds recently and her blood pressure today was 90/40.  This was repeated.  She did take her Lasix today.  She is on hydralazine in addition to [place].  Recently I backed off on her spironolactone from 25 to 12.5 mg daily.  He is requesting handicap permit.  She uses an assistive device to walk and with her heart failure would likely qualify for permanent parking placard.   Allergies  Allergen Reactions   Biotin Hives   Lisinopril Cough   Atenolol Other (See Comments)    colitis Other reaction(s): Bowel issues   Duloxetine Hcl Other (See Comments)    Mad pt feel spacey Other reaction(s): shaky; tired in the PMs   Lyrica [Pregabalin] Other (See Comments)    Made pt feel spacey   Penicillin G Rash    Other reaction(s): rash   Penicillins Rash   Sulfamethoxazole-Trimethoprim Nausea And Vomiting    Other reaction(s): elevated liver functions    Current Outpatient Medications  Medication Sig Dispense Refill   acetaminophen (TYLENOL) 500 MG tablet Take 500 mg by mouth as needed.     apixaban (ELIQUIS) 5 MG TABS tablet Take 1 tablet (5 mg total) by mouth 2 (two) times daily. 180 tablet 1   atorvastatin (LIPITOR) 40 MG tablet Take 1 tablet (40 mg total) by mouth daily. 90 tablet 1   B Complex Vitamins (VITAMIN B COMPLEX) TABS 100 mg 1 tablet by mouth once a day     BOOSTRIX 5-2.5-18.5 LF-MCG/0.5 injection      calcium carbonate (TUMS) 500 MG chewable tablet 1 tablet Orally Once a day As needed     cephALEXin (KEFLEX) 500 MG capsule Take 500 mg by mouth every 12 (twelve) hours.     clobetasol ointment (TEMOVATE) 0.05 % Apply 1 Application topically 2 (two) times daily.     furosemide (LASIX) 40 MG tablet Take 1 tablet (40 mg total) by mouth every Monday, Wednesday, and Friday. 10 tablet 0   hydrALAZINE (APRESOLINE) 10 MG tablet TAKE 1 TABLET BY MOUTH THREE TIMES A DAY 270 tablet 3   HYDROcodone-acetaminophen  (NORCO/VICODIN) 5-325 MG tablet Take 1 tablet by mouth 4 (four) times daily as needed for moderate pain. May Take an extra 0.5 to one tablet when pain is severe. 140 tablet 0   isosorbide mononitrate (IMDUR) 30 MG 24  hr tablet Take 1 tablet (30 mg total) by mouth daily. 90 tablet 1   methocarbamol (ROBAXIN) 500 MG tablet 1 capsule orally every 8 hrs as needed     metoprolol succinate (TOPROL-XL) 25 MG 24 hr tablet Take 3 tablets (75 mg total) by mouth daily. Take with or immediately following a meal. 270 tablet 1   Multiple Vitamin (MULITIVITAMIN WITH MINERALS) TABS Take 1 tablet by mouth daily.     ondansetron (ZOFRAN-ODT) 4 MG disintegrating tablet Take 1 tablet (4 mg total) by mouth every 8 (eight) hours. 20 tablet 0   pantoprazole (PROTONIX) 40 MG tablet Take 1 tablet (40 mg total) by mouth 2 (two) times daily. 60 tablet 1   potassium chloride (KLOR-CON M) 10 MEQ tablet Take 1 tablet (10 mEq total) by mouth every Monday, Wednesday, and Friday. 10 tablet 0   sacubitril-valsartan (ENTRESTO) 24-26 MG 1 tablet Oral Twice a day     SHINGRIX injection      spironolactone (ALDACTONE) 25 MG tablet Take 12.5 mg by mouth daily.     apixaban (ELIQUIS) 5 MG TABS tablet 1 tablet Oral twice a day (Patient not taking: Reported on 06/12/2023)     b complex vitamins capsule Take 1 capsule by mouth daily. (Patient not taking: Reported on 06/12/2023)     citalopram (CELEXA) 20 MG tablet Take 1 tablet (20 mg total) by mouth daily. 30 tablet 0   ibuprofen (ADVIL) 400 MG tablet 1 tablet with food or milk as needed Orally Three times a day (Patient not taking: Reported on 06/12/2023)     MULTIPLE VITAMIN PO 1 tablet Orally Once a day (Patient not taking: Reported on 06/12/2023)     traZODone (DESYREL) 50 MG tablet Take 1 tablet (50 mg total) by mouth at bedtime as needed for sleep. 30 tablet 0   No current facility-administered medications for this visit.    Past Medical History:  Diagnosis Date   Abdominal aortic  aneurysm (HCC)    Anemia, improved 01/11/2013   Basal cell carcinoma    CAD (coronary artery disease)    hx CABG 1998, last cath 2007 with PCI and stenting to the proximal segment of the vein graft to the diagonal branch. DES Taxus was placed   Cervicalgia    Chronic fatigue fibromyalgia syndrome    Chronic pain syndrome    Depression    Fibromyalgia    History of nuclear stress test 08/07/2011   lexiscan; no evidence of ischemia or infarct; low risk    Hyperlipidemia    Hypertension    OSA (obstructive sleep apnea)    PAD (peripheral artery disease) (HCC)    Pes anserine bursitis    Pulmonary embolism (HCC) 01/11/2013   S/P resection of aortic aneurysm    Trochanteric bursitis    Ulcerative colitis [facility])     Past Surgical History:  Procedure Laterality Date   AORTOBIEXTERNAL ILIAC BYPASS  1991   BIOPSY  11/25/2022   Procedure: BIOPSY;  Surgeon: Jeani Hawking, MD;  Location: [facility] ENDOSCOPY;  Service: Gastroenterology;;   CATARACT EXTRACTION Left 09/2019   CHOLECYSTECTOMY  1997   COLONOSCOPY WITH PROPOFOL N/A 11/25/2022   Procedure: COLONOSCOPY WITH PROPOFOL;  Surgeon: Jeani Hawking, MD;  Location: [facility] ENDOSCOPY;  Service: Gastroenterology;  Laterality: N/A;   CORONARY ANGIOPLASTY WITH STENT PLACEMENT  2006   to LAD, ATRETIC LIMA AT THAT TIME ALSO   CORONARY ANGIOPLASTY WITH STENT PLACEMENT  2007   STENT TO PROX VG-DIAG, OTHER STENTS PATENT  CORONARY ARTERY BYPASS GRAFT  1998   LIMA-LAD;VG-DIAG & RCA   CORONARY STENT PLACEMENT  2005   TO LAD & LCX-STENTS,   ESOPHAGOGASTRODUODENOSCOPY (EGD) WITH PROPOFOL N/A 11/25/2022   Procedure: ESOPHAGOGASTRODUODENOSCOPY (EGD) WITH PROPOFOL;  Surgeon: Jeani Hawking, MD;  Location: [facility] ENDOSCOPY;  Service: Gastroenterology;  Laterality: N/A;   HERNIA REPAIR     TUBAL LIGATION     BTL   EKG: EKG Interpretation Date/Time:  Friday June 12 2023 13:27:03 EST Ventricular Rate:  76 PR Interval:  198 QRS Duration:  80 QT Interval:  386 QTC  Calculation: 434 R Axis:   4  Text Interpretation: Sinus rhythm  with frequent PAC's Septal infarct , age undetermined When compared with ECG of 02-Mar-2023 23:08, Sinus rhythm has replaced Atrial fibrillation ST no longer depressed in Lateral leads T wave inversion more evident in Lateral leads QT has shortened Confirmed by Zoila Shutter [phone]) on 06/12/2023 1:40:48 PM    ROS: Pertinent items noted in HPI and remainder of comprehensive ROS otherwise negative.  PHYSICAL EXAM BP (!) 90/40 (BP Location: Right Arm, Patient Position: Sitting, Cuff Size: Normal)   Pulse 84   Ht 5\' 3"  (1.6 m)   Wt 163 lb 12.8 oz (74.3 kg)   SpO2 92%   BMI 29.02 kg/m  General appearance: alert, no distress, and moderately obese Neck: no carotid bruit and no JVD Lungs: clear to auscultation bilaterally Heart: regular rate and rhythm and systolic murmur: systolic ejection 3/6, crescendo at 2nd right intercostal space Abdomen: soft, non-tender; bowel sounds normal; no masses,  no organomegaly Extremities: extremities normal, atraumatic, no cyanosis or edema Pulses: 2+ and symmetric Skin: Pale, cool, dry Neurologic: Grossly normal Psych: Pleasant  ASSESSMENT AND PLAN Patient Active Problem List   Diagnosis Date Noted   Acute on chronic systolic CHF (congestive heart failure) (HCC) 03/03/2023   Acute on chronic diastolic congestive heart failure (HCC) 03/02/2023   Pulmonary embolism with infarction (HCC) 03/02/2023   Chronic heart failure with preserved ejection fraction (HFpEF) (HCC) 02/11/2023   Hypervolemia 02/11/2023   Acute encephalopathy 02/05/2023   Elevated troponin 02/05/2023   Hypokalemia 02/05/2023   Hypomagnesemia 02/05/2023   Intractable nausea and vomiting 02/05/2023   Metabolic alkalosis 02/05/2023   Demand ischemia (HCC) 11/24/2022   Chest pain 11/23/2022   Non-ST elevation (NSTEMI) myocardial infarction (HCC) 11/23/2022   Acute gastrointestinal bleeding 11/23/2022   Chronic  combined systolic (congestive) and diastolic (congestive) heart failure (HCC) 11/23/2022   CKD (chronic kidney disease) stage 3, GFR 30-59 ml/min (HCC) 11/23/2022   DNR (do not resuscitate) 11/23/2022   Anxiety disorder 09/25/2021   Paroxysmal atrial fibrillation (HCC) 02/26/2021   Atrial fibrillation (HCC) 09/09/2018   DOE (dyspnea on exertion) 08/10/2017   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   Greater trochanteric bursitis of right hip 11/07/2014   Sacro-iliac pain 11/07/2014   Chronic midline low back pain without sciatica 11/07/2013   Hepatitis 04/20/2013   Screening for STD (sexually transmitted disease) 04/20/2013   Recurrent UTI 04/20/2013   Abdominal aortic aneurysm (HCC)    Ulcerative colitis (HCC)    Obstructive sleep apnea    Fibromyalgia    Coronary artery disease    Basal cell carcinoma    Symptomatic anemia 01/11/2013   Ascending aortic aneurysm, 4.1 cm 01/11/2013   Cervical spondylolysis 10/15/2011   Chronic periscapular pain 10/15/2011   Chest wall pain    S/P resection of aortic aneurysm, 1998 prior to CABG    PAD (peripheral artery  disease), aortic-iliac bypass 1991, mild carotid bruits    OSA (obstructive sleep apnea)    Chronic fatigue syndrome with fibromyalgia    Hyperlipidemia    Ulcerative colitis (HCC)    Depression    CAD (coronary artery disease), CABG 1998, STENTS MULTIPLE SITES SINCE.     Accelerated hypertension    PLAN: Mrs. Allanson is noted to be hypotensive today.  She has had low blood pressures fairly recently and I cut back on her spironolactone.  She is also lost 20 pounds which I think is contributing to this.  Will discontinue her hydralazine.  She should monitor blood pressures at home as well as her symptoms.  We may need to further reduce her blood pressure medications.  She was provided a blood pressure log and to reach out to [place] in the next week or 2.  Otherwise plan follow-up in 6 months or sooner as necessary.  Chrystie Nose, MD, [facility], FACP  [facility]  Medical Director of the Advanced Lipid Disorders &  Cardiovascular Risk Reduction Clinic Diplomate of the American Board of Clinical Lipidology Attending Cardiologist  Direct Dial: [phone]  Fax: [phone]  Website:  [email or web address]

## 2023-06-12 NOTE — Patient Instructions (Signed)
Medication Instructions:  STOP hydralazine   Check BP at home - 1-2 times daily Call or send a message thru MyChart with your BP readings after about 7-10 days  *If you need a refill on your cardiac medications before your next appointment, please call your pharmacy*   Follow-Up: At [facility], you and your health needs are our priority.  As part of our continuing mission to provide you with exceptional heart care, we have created designated Provider Care Teams.  These Care Teams include your primary Cardiologist (physician) and Advanced Practice Providers (APPs -  Physician Assistants and Nurse Practitioners) who all work together to provide you with the care you need, when you need it.  We recommend signing up for the patient portal called "MyChart".  Sign up information is provided on this After Visit Summary.  MyChart is used to connect with patients for Virtual Visits (Telemedicine).  Patients are able to view lab/test results, encounter notes, upcoming appointments, etc.  Non-urgent messages can be sent to your provider as well.   To learn more about what you can do with MyChart, go to [email or web address].    Your next appointment:   6 months with Dr. Rennis Golden  ** call in Jan 2025 for next appointment

## 2023-06-17 DIAGNOSIS — F339 Major depressive disorder, recurrent, unspecified: Secondary | ICD-10-CM | POA: Diagnosis not present

## 2023-06-17 DIAGNOSIS — L89892 Pressure ulcer of other site, stage 2: Secondary | ICD-10-CM | POA: Diagnosis not present

## 2023-06-17 DIAGNOSIS — I5032 Chronic diastolic (congestive) heart failure: Secondary | ICD-10-CM | POA: Diagnosis not present

## 2023-06-17 DIAGNOSIS — G4733 Obstructive sleep apnea (adult) (pediatric): Secondary | ICD-10-CM | POA: Diagnosis not present

## 2023-06-17 DIAGNOSIS — H02052 Trichiasis without entropian right lower eyelid: Secondary | ICD-10-CM | POA: Diagnosis not present

## 2023-06-17 DIAGNOSIS — G4700 Insomnia, unspecified: Secondary | ICD-10-CM | POA: Diagnosis not present

## 2023-06-17 DIAGNOSIS — I700 Atherosclerosis of aorta: Secondary | ICD-10-CM | POA: Diagnosis not present

## 2023-06-18 ENCOUNTER — Encounter: Payer: Self-pay | Admitting: Registered Nurse

## 2023-06-18 ENCOUNTER — Encounter: Payer: PPO | Attending: Registered Nurse | Admitting: Registered Nurse

## 2023-06-18 VITALS — BP 107/70 | HR 82 | Ht 63.0 in | Wt 165.0 lb

## 2023-06-18 DIAGNOSIS — M5416 Radiculopathy, lumbar region: Secondary | ICD-10-CM

## 2023-06-18 DIAGNOSIS — G8929 Other chronic pain: Secondary | ICD-10-CM

## 2023-06-18 DIAGNOSIS — M7061 Trochanteric bursitis, right hip: Secondary | ICD-10-CM

## 2023-06-18 DIAGNOSIS — M797 Fibromyalgia: Secondary | ICD-10-CM

## 2023-06-18 DIAGNOSIS — Z79891 Long term (current) use of opiate analgesic: Secondary | ICD-10-CM | POA: Diagnosis not present

## 2023-06-18 DIAGNOSIS — G894 Chronic pain syndrome: Secondary | ICD-10-CM

## 2023-06-18 DIAGNOSIS — Z5181 Encounter for therapeutic drug level monitoring: Secondary | ICD-10-CM | POA: Diagnosis not present

## 2023-06-18 DIAGNOSIS — M546 Pain in thoracic spine: Secondary | ICD-10-CM | POA: Diagnosis not present

## 2023-06-18 MED ORDER — HYDROCODONE-ACETAMINOPHEN 5-325 MG PO TABS: 1.0000 | ORAL_TABLET | Freq: Four times a day (QID) | ORAL | 0 refills | Status: DC | PRN

## 2023-06-18 NOTE — Progress Notes (Signed)
Subjective:    Patient ID: Kara Bush, female    DOB: 12-Sep-1940, 82 y.o.   MRN: [id number]  HPI: Kara Bush is a 82 y.o. female who returns for follow up appointment for chronic pain and medication refill. She states her pain is located in her mid- lower back pain radiating into her right hip and right lower extremity. She rates her pain 3. Her current exercise regime is walking and performing stretching exercises.  [name] Morphine equivalent is 25.00 MME.   Last UDS was Performed on 04/22/2023, it was consistent.     Pain Inventory Average Pain 4 Pain Right Now 3 My pain is aching  In the last 24 hours, has pain interfered with the following? General activity 5 Relation with others 1 Enjoyment of life 5 What TIME of day is your pain at its worst? evening Sleep (in general) Fair  Pain is worse with: walking, bending, and some activites Pain improves with: heat/ice and medication Relief from Meds: 6  Family History  Problem Relation Age of Onset   Heart failure Mother        at 66   Heart disease Father    Heart attack Father        @ 98   Healthy Sister    Cancer Brother        pancreatic    Cancer Paternal Aunt        BREAST   Heart failure Maternal Grandmother    Heart attack Maternal Grandfather    Heart attack Paternal Grandmother        at 25   Healthy Daughter    Healthy Daughter    Social History   Socioeconomic History   Marital status: Divorced    Spouse name: Not on file   Number of children: 2   Years of education: college    Highest education level: Not on file  Occupational History    Employer: RETIRED  Tobacco Use   Smoking status: Former    Current packs/day: 0.00    Average packs/day: 2.5 packs/day for 20.0 years (50.0 ttl pk-yrs)    Types: Cigarettes    Start date: 06/29/1970    Quit date: 06/29/1990    Years since quitting: 32.9    Passive exposure: Never   Smokeless tobacco: Never  Vaping Use   Vaping status:  Never Used  Substance and Sexual Activity   Alcohol use: No   Drug use: No   Sexual activity: Not on file  Other Topics Concern   Not on file  Social History Narrative   Not on file   Social Determinants of Health   Financial Resource Strain: Not on file  Food Insecurity: No Food Insecurity (03/02/2023)   Hunger Vital Sign    Worried About Running Out of Food in the Last Year: Never true    Ran Out of Food in the Last Year: Never true  Transportation Needs: No Transportation Needs (03/02/2023)   PRAPARE - Administrator, Civil Service (Medical): No    Lack of Transportation (Non-Medical): No  Physical Activity: Not on file  Stress: Not on file  Social Connections: Not on file   Past Surgical History:  Procedure Laterality Date   AORTOBIEXTERNAL ILIAC BYPASS  1991   BIOPSY  11/25/2022   Procedure: BIOPSY;  Surgeon: Jeani Hawking, MD;  Location: [facility] ENDOSCOPY;  Service: Gastroenterology;;   CATARACT EXTRACTION Left 09/2019   CHOLECYSTECTOMY  1997   COLONOSCOPY WITH  PROPOFOL N/A 11/25/2022   Procedure: COLONOSCOPY WITH PROPOFOL;  Surgeon: Jeani Hawking, MD;  Location: [facility] ENDOSCOPY;  Service: Gastroenterology;  Laterality: N/A;   CORONARY ANGIOPLASTY WITH STENT PLACEMENT  2006   to LAD, ATRETIC LIMA AT THAT TIME ALSO   CORONARY ANGIOPLASTY WITH STENT PLACEMENT  2007   STENT TO PROX VG-DIAG, OTHER STENTS PATENT   CORONARY ARTERY BYPASS GRAFT  1998   LIMA-LAD;VG-DIAG & RCA   CORONARY STENT PLACEMENT  2005   TO LAD & LCX-STENTS,   ESOPHAGOGASTRODUODENOSCOPY (EGD) WITH PROPOFOL N/A 11/25/2022   Procedure: ESOPHAGOGASTRODUODENOSCOPY (EGD) WITH PROPOFOL;  Surgeon: Jeani Hawking, MD;  Location: [facility] ENDOSCOPY;  Service: Gastroenterology;  Laterality: N/A;   HERNIA REPAIR     TUBAL LIGATION     BTL   Past Surgical History:  Procedure Laterality Date   AORTOBIEXTERNAL ILIAC BYPASS  1991   BIOPSY  11/25/2022   Procedure: BIOPSY;  Surgeon: Jeani Hawking, MD;  Location: [facility]  ENDOSCOPY;  Service: Gastroenterology;;   CATARACT EXTRACTION Left 09/2019   CHOLECYSTECTOMY  1997   COLONOSCOPY WITH PROPOFOL N/A 11/25/2022   Procedure: COLONOSCOPY WITH PROPOFOL;  Surgeon: Jeani Hawking, MD;  Location: [facility] ENDOSCOPY;  Service: Gastroenterology;  Laterality: N/A;   CORONARY ANGIOPLASTY WITH STENT PLACEMENT  2006   to LAD, ATRETIC LIMA AT THAT TIME ALSO   CORONARY ANGIOPLASTY WITH STENT PLACEMENT  2007   STENT TO PROX VG-DIAG, OTHER STENTS PATENT   CORONARY ARTERY BYPASS GRAFT  1998   LIMA-LAD;VG-DIAG & RCA   CORONARY STENT PLACEMENT  2005   TO LAD & LCX-STENTS,   ESOPHAGOGASTRODUODENOSCOPY (EGD) WITH PROPOFOL N/A 11/25/2022   Procedure: ESOPHAGOGASTRODUODENOSCOPY (EGD) WITH PROPOFOL;  Surgeon: Jeani Hawking, MD;  Location: [facility] ENDOSCOPY;  Service: Gastroenterology;  Laterality: N/A;   HERNIA REPAIR     TUBAL LIGATION     BTL   Past Medical History:  Diagnosis Date   Abdominal aortic aneurysm (HCC)    Anemia, improved 01/11/2013   Basal cell carcinoma    CAD (coronary artery disease)    hx CABG 1998, last cath 2007 with PCI and stenting to the proximal segment of the vein graft to the diagonal branch. DES Taxus was placed   Cervicalgia    Chronic fatigue fibromyalgia syndrome    Chronic pain syndrome    Depression    Fibromyalgia    History of nuclear stress test 08/07/2011   lexiscan; no evidence of ischemia or infarct; low risk    Hyperlipidemia    Hypertension    OSA (obstructive sleep apnea)    PAD (peripheral artery disease) (HCC)    Pes anserine bursitis    Pulmonary embolism (HCC) 01/11/2013   S/P resection of aortic aneurysm    Trochanteric bursitis    Ulcerative colitis (HCC)    BP 107/70   Pulse 82   Ht 5\' 3"  (1.6 m)   Wt 165 lb (74.8 kg)   SpO2 98%   BMI 29.23 kg/m   Opioid Risk Score:   Fall Risk Score:  `1  Depression screen PHQ 2/9     04/22/2023    2:28 PM 02/25/2023    1:31 PM 10/17/2022    1:38 PM 08/18/2022    1:15 PM 06/23/2022     1:03 PM 02/19/2022    1:40 PM 10/21/2021   11:08 AM  Depression screen PHQ 2/9  Decreased Interest 0 0 0 0 1 0 0  Down, Depressed, Hopeless 0 0 0 0 1 0 0  PHQ -  2 Score 0 0 0 0 2 0 0      Review of Systems  Musculoskeletal:  Positive for back pain.       Right hip and leg pain  All other systems reviewed and are negative.     Objective:   Physical Exam Vitals and nursing note reviewed.  Constitutional:      Appearance: Normal appearance.  Cardiovascular:     Rate and Rhythm: Normal rate and regular rhythm.     Pulses: Normal pulses.     Heart sounds: Normal heart sounds.  Pulmonary:     Effort: Pulmonary effort is normal.     Breath sounds: Normal breath sounds.  Musculoskeletal:     Comments: Normal Muscle Bulk and Muscle Testing Reveals:  Upper Extremities: Right Upper Extremity: Decreased ROM 90 Degrees and Muscle Strength 5/5 Left Upper Extremity: Full ROM and Muscle Strength 5/5 Thoracic Paraspinal Tenderness: T-7-T-9 Lumbar Paraspinal Tenderness: L- 4 L-5  Bilateral Greater Trochanter Tenderness: R>L Lower Extremities: Full ROM and Muscle Strength 5/5 Arises from chair slowly Narrow Based  Gait     Skin:    General: Skin is warm and dry.  Neurological:     Mental Status: She is alert and oriented to person, place, and time.  Psychiatric:        Mood and Affect: Mood normal.        Behavior: Behavior normal.         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 06/18/2023 2. Fibromyalgia: Continue  Home Exercise Program. Continue to Monitor.06/18/2023 3. Bilateral intermittent hand numbness: Carpal tunnel syndrome versus C6 radiculopathy mild: No complaints today. Continue to Monitor. 06/18/2023 4. Chronic Midline Low Back Pain/ LBP: Refilled: Norco 5/325mg  # 140 pills--use one pill every 6 hours as needed for pain. A second  prescription was e-scribe for the following month 06/18/2023 We will continue the opioid monitoring program, this consists of regular clinic visits, examinations, urine drug screen, pill counts as well as use of [place] Controlled Substance Reporting system. A 12 month History has been reviewed on the [place] Controlled Substance Reporting System on 06/18/2023. 5. Muscle Spasm: Continue current medication regimen with  Robaxin. 06/18/2023 6.Right  Greater Trochanteric Bursitis:Continue to Alternate with heat and ice therapy. Continue current medication regime. 11/14/202 7. Left Foot pain: No complaints today.Continue with HEP as Tolerated. Continue to Monitor. 06/18/2023 8. Right Lateral Epicondylitis Pain: No complaints Today. Continue HEP as Tolerated. Continue to Monitor. 06/18/2023 9. Right Shoulder Tendonitis:  No complaints today. Continue Current Medication Regimen. Continue to Alternate Ice and Heat Therapy. 06/18/2023 10 Bilateral Chronic Knee Pain: .  Continue HEP as Tolerated. Continue to Monitor. 06/18/2023   F/U in 2 month

## 2023-07-03 DIAGNOSIS — I2489 Other forms of acute ischemic heart disease: Secondary | ICD-10-CM | POA: Diagnosis not present

## 2023-07-03 DIAGNOSIS — I5042 Chronic combined systolic (congestive) and diastolic (congestive) heart failure: Secondary | ICD-10-CM | POA: Diagnosis not present

## 2023-07-03 DIAGNOSIS — G4733 Obstructive sleep apnea (adult) (pediatric): Secondary | ICD-10-CM | POA: Diagnosis not present

## 2023-07-03 DIAGNOSIS — I214 Non-ST elevation (NSTEMI) myocardial infarction: Secondary | ICD-10-CM | POA: Diagnosis not present

## 2023-07-07 DIAGNOSIS — H02052 Trichiasis without entropian right lower eyelid: Secondary | ICD-10-CM | POA: Diagnosis not present

## 2023-07-28 ENCOUNTER — Encounter: Payer: Self-pay | Admitting: Internal Medicine

## 2023-07-30 DIAGNOSIS — I5030 Unspecified diastolic (congestive) heart failure: Secondary | ICD-10-CM | POA: Diagnosis not present

## 2023-07-30 DIAGNOSIS — Z6828 Body mass index (BMI) 28.0-28.9, adult: Secondary | ICD-10-CM | POA: Diagnosis not present

## 2023-07-30 NOTE — Telephone Encounter (Signed)
Patient identification verified by 2 forms. Marilynn Rail, RN    Called and spoke to patient  Patient states:   -unsure if she should discontinue furosemide, takes it MWF   -lowest BP has been 104/47  -main issue is she felt dizzy when standing   -typically feels dizzy when standing/changing positions   -had one episode where she thought she would pass out, passed after lying down   -does not have SOB   -has heaviness in legs when walking Patient denies:   -SOB/difficulty breathing  Advised patient to change positions slowly  Informed patient message sent to Dr. Rennis Golden for input/advisement  Patient agrees, no further questions/concerns at this time

## 2023-08-02 DIAGNOSIS — G4733 Obstructive sleep apnea (adult) (pediatric): Secondary | ICD-10-CM | POA: Diagnosis not present

## 2023-08-02 DIAGNOSIS — I214 Non-ST elevation (NSTEMI) myocardial infarction: Secondary | ICD-10-CM | POA: Diagnosis not present

## 2023-08-02 DIAGNOSIS — I5042 Chronic combined systolic (congestive) and diastolic (congestive) heart failure: Secondary | ICD-10-CM | POA: Diagnosis not present

## 2023-08-02 DIAGNOSIS — I2489 Other forms of acute ischemic heart disease: Secondary | ICD-10-CM | POA: Diagnosis not present

## 2023-08-19 NOTE — Progress Notes (Signed)
Subjective:    Patient ID: [name], female    DOB: [birth year], 83 y.o.   MRN: [id number]  HPI: Kara Bush is a 83 y.o. female who returns for follow up appointment for chronic pain and medication refill. She states her pain is located in her right shoulder, mid-back mainly right side and right hip pain. She rates her pain 3. Her current exercise regime is walking and performing stretching exercises.  Ms. Hoganson Morphine equivalent is 25.00 MME.   UDS ordered today.      Pain Inventory Average Pain 4 Pain Right Now 3 My pain is constant and aching  In the last 24 hours, has pain interfered with the following? General activity 3 Relation with others 0 Enjoyment of life 5 What TIME of day is your pain at its worst? morning  Sleep (in general) Poor  Pain is worse with: bending and some activites Pain improves with: rest and medication Relief from Meds: 5  Family History  Problem Relation Age of Onset   Heart failure Mother        at 19   Heart disease Father    Heart attack Father        @ 27   Healthy Sister    Cancer Brother        pancreatic    Cancer Paternal Aunt        BREAST   Heart failure Maternal Grandmother    Heart attack Maternal Grandfather    Heart attack Paternal Grandmother        at 61   Healthy Daughter    Healthy Daughter    Social History   Socioeconomic History   Marital status: Divorced    Spouse name: Not on file   Number of children: 2   Years of education: college    Highest education level: Not on file  Occupational History    Employer: RETIRED  Tobacco Use   Smoking status: Former    Current packs/day: 0.00    Average packs/day: 2.5 packs/day for 20.0 years (50.0 ttl pk-yrs)    Types: Cigarettes    Start date: 06/29/1970    Quit date: 06/29/1990    Years since quitting: 33.1    Passive exposure: Never   Smokeless tobacco: Never  Vaping Use   Vaping status: Never Used  Substance and Sexual Activity    Alcohol use: No   Drug use: No   Sexual activity: Not on file  Other Topics Concern   Not on file  Social History Narrative   Not on file   Social Drivers of Health   Financial Resource Strain: Not on file  Food Insecurity: No Food Insecurity (03/02/2023)   Hunger Vital Sign    Worried About Running Out of Food in the Last Year: Never true    Ran Out of Food in the Last Year: Never true  Transportation Needs: No Transportation Needs (03/02/2023)   PRAPARE - Administrator, Civil Service (Medical): No    Lack of Transportation (Non-Medical): No  Physical Activity: Not on file  Stress: Not on file  Social Connections: Not on file   Past Surgical History:  Procedure Laterality Date   AORTOBIEXTERNAL ILIAC BYPASS  1991   BIOPSY  11/25/2022   Procedure: BIOPSY;  Surgeon: Jeani Hawking, MD;  Location: [facility] ENDOSCOPY;  Service: Gastroenterology;;   CATARACT EXTRACTION Left 09/2019   CHOLECYSTECTOMY  1997   COLONOSCOPY WITH PROPOFOL N/A 11/25/2022   Procedure:  COLONOSCOPY WITH PROPOFOL;  Surgeon: Jeani Hawking, MD;  Location: [facility] ENDOSCOPY;  Service: Gastroenterology;  Laterality: N/A;   CORONARY ANGIOPLASTY WITH STENT PLACEMENT  2006   to LAD, ATRETIC LIMA AT THAT TIME ALSO   CORONARY ANGIOPLASTY WITH STENT PLACEMENT  2007   STENT TO PROX VG-DIAG, OTHER STENTS PATENT   CORONARY ARTERY BYPASS GRAFT  1998   LIMA-LAD;VG-DIAG & RCA   CORONARY STENT PLACEMENT  2005   TO LAD & LCX-STENTS,   ESOPHAGOGASTRODUODENOSCOPY (EGD) WITH PROPOFOL N/A 11/25/2022   Procedure: ESOPHAGOGASTRODUODENOSCOPY (EGD) WITH PROPOFOL;  Surgeon: Jeani Hawking, MD;  Location: [facility] ENDOSCOPY;  Service: Gastroenterology;  Laterality: N/A;   HERNIA REPAIR     TUBAL LIGATION     BTL   Past Surgical History:  Procedure Laterality Date   AORTOBIEXTERNAL ILIAC BYPASS  1991   BIOPSY  11/25/2022   Procedure: BIOPSY;  Surgeon: Jeani Hawking, MD;  Location: [facility] ENDOSCOPY;  Service: Gastroenterology;;   CATARACT  EXTRACTION Left 09/2019   CHOLECYSTECTOMY  1997   COLONOSCOPY WITH PROPOFOL N/A 11/25/2022   Procedure: COLONOSCOPY WITH PROPOFOL;  Surgeon: Jeani Hawking, MD;  Location: [facility] ENDOSCOPY;  Service: Gastroenterology;  Laterality: N/A;   CORONARY ANGIOPLASTY WITH STENT PLACEMENT  2006   to LAD, ATRETIC LIMA AT THAT TIME ALSO   CORONARY ANGIOPLASTY WITH STENT PLACEMENT  2007   STENT TO PROX VG-DIAG, OTHER STENTS PATENT   CORONARY ARTERY BYPASS GRAFT  1998   LIMA-LAD;VG-DIAG & RCA   CORONARY STENT PLACEMENT  2005   TO LAD & LCX-STENTS,   ESOPHAGOGASTRODUODENOSCOPY (EGD) WITH PROPOFOL N/A 11/25/2022   Procedure: ESOPHAGOGASTRODUODENOSCOPY (EGD) WITH PROPOFOL;  Surgeon: Jeani Hawking, MD;  Location: [facility] ENDOSCOPY;  Service: Gastroenterology;  Laterality: N/A;   HERNIA REPAIR     TUBAL LIGATION     BTL   Past Medical History:  Diagnosis Date   Abdominal aortic aneurysm (HCC)    Anemia, improved 01/11/2013   Basal cell carcinoma    CAD (coronary artery disease)    hx CABG 1998, last cath 2007 with PCI and stenting to the proximal segment of the vein graft to the diagonal branch. DES Taxus was placed   Cervicalgia    Chronic fatigue fibromyalgia syndrome    Chronic pain syndrome    Depression    Fibromyalgia    History of nuclear stress test 08/07/2011   lexiscan; no evidence of ischemia or infarct; low risk    Hyperlipidemia    Hypertension    OSA (obstructive sleep apnea)    PAD (peripheral artery disease) (HCC)    Pes anserine bursitis    Pulmonary embolism (HCC) 01/11/2013   S/P resection of aortic aneurysm    Trochanteric bursitis    Ulcerative colitis (HCC)    There were no vitals taken for this visit.  Opioid Risk Score:   Fall Risk Score:  `1  Depression screen PHQ 2/9     04/22/2023    2:28 PM 02/25/2023    1:31 PM 10/17/2022    1:38 PM 08/18/2022    1:15 PM 06/23/2022    1:03 PM 02/19/2022    1:40 PM 10/21/2021   11:08 AM  Depression screen PHQ 2/9  Decreased Interest  0 0 0 0 1 0 0  Down, Depressed, Hopeless 0 0 0 0 1 0 0  PHQ - 2 Score 0 0 0 0 2 0 0    Review of Systems  Musculoskeletal:  Positive for back pain.  Pain in right back, right hip, right scapula   All other systems reviewed and are negative.      Objective:   Physical Exam Vitals and nursing note reviewed.  Constitutional:      Appearance: Normal appearance.  Cardiovascular:     Rate and Rhythm: Rhythm irregular.     Pulses: Normal pulses.     Heart sounds: Normal heart sounds.  Pulmonary:     Effort: Pulmonary effort is normal.     Breath sounds: Normal breath sounds.  Musculoskeletal:     Comments: Normal Muscle Bulk and Muscle Testing Reveals:  Upper Extremities:Full  ROM and Muscle Strength 5/5 Thoracic Paraspinal Tenderness  T-7-T-9 Mainly Right Side  Lower Extremities: Full ROM and Muscle Strength 5/5 Arises from chair slowly Narrow based  Gait     Skin:    General: Skin is warm and dry.  Neurological:     General: No focal deficit present.     Mental Status: She is alert and oriented to person, place, and time.           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 08/20/2023 2. Fibromyalgia: Continue  Home Exercise Program. Continue to Monitor.08/20/2023 3. Bilateral intermittent hand numbness: Carpal tunnel syndrome versus C6 radiculopathy mild: No complaints today. Continue to Monitor. 08/20/2023 4. Chronic Midline Low Back Pain/ LBP: Refilled: Norco 5/325mg  # 140 pills--use one pill every 6 hours as needed for pain. A second prescription was e-scribe for the following month 08/20/2023 We will continue the opioid monitoring program, this consists of regular clinic visits, examinations, urine drug screen, pill counts as well as use of [place] Controlled Substance Reporting system. A 12 month History has been reviewed on the [place] Controlled Substance Reporting System on 08/20/2023. 5. Muscle Spasm: Continue current medication regimen with  Robaxin. 08/20/2023 6.Right  Greater Trochanteric Bursitis:Continue to Alternate with heat and ice therapy. Continue current medication regime. 11/14/202 7. Left Foot pain: No complaints today.Continue with HEP as Tolerated. Continue to Monitor. 08/20/2023 8. Right Lateral Epicondylitis Pain: No complaints Today. Continue HEP as Tolerated. Continue to Monitor. 08/20/2023 9. Right Shoulder Tendonitis: Continue Current Medication Regimen. Continue to Alternate Ice and Heat Therapy. 08/20/2023 10 Bilateral Chronic Knee Pain: .Continue HEP as Tolerated. Continue to Monitor. 08/20/2023   F/U in 2 month

## 2023-08-20 ENCOUNTER — Encounter: Payer: Self-pay | Admitting: Registered Nurse

## 2023-08-20 ENCOUNTER — Encounter: Payer: PPO | Attending: Registered Nurse | Admitting: Registered Nurse

## 2023-08-20 VITALS — BP 127/66 | HR 72 | Ht 63.0 in | Wt 167.0 lb

## 2023-08-20 DIAGNOSIS — M546 Pain in thoracic spine: Secondary | ICD-10-CM | POA: Diagnosis not present

## 2023-08-20 DIAGNOSIS — M797 Fibromyalgia: Secondary | ICD-10-CM | POA: Insufficient documentation

## 2023-08-20 DIAGNOSIS — Z79891 Long term (current) use of opiate analgesic: Secondary | ICD-10-CM | POA: Diagnosis not present

## 2023-08-20 DIAGNOSIS — G8929 Other chronic pain: Secondary | ICD-10-CM | POA: Insufficient documentation

## 2023-08-20 DIAGNOSIS — Z5181 Encounter for therapeutic drug level monitoring: Secondary | ICD-10-CM | POA: Diagnosis not present

## 2023-08-20 DIAGNOSIS — M7061 Trochanteric bursitis, right hip: Secondary | ICD-10-CM | POA: Diagnosis present

## 2023-08-20 DIAGNOSIS — G894 Chronic pain syndrome: Secondary | ICD-10-CM | POA: Insufficient documentation

## 2023-08-20 MED ORDER — HYDROCODONE-ACETAMINOPHEN 5-325 MG PO TABS: 1.0000 | ORAL_TABLET | Freq: Four times a day (QID) | ORAL | 0 refills | Status: DC | PRN

## 2023-08-25 LAB — TOXASSURE SELECT,+ANTIDEPR,UR

## 2023-08-27 ENCOUNTER — Telehealth: Payer: Self-pay | Admitting: Internal Medicine

## 2023-08-27 NOTE — Telephone Encounter (Signed)
Called and spoke to patient. Verified name and DOB. Patient report she has been having numbness in both her legs and chest discomfort that comes and goes for 2 weeks. She stated she was at the eye doctor yesterday and got up to get her phone and her legs gave away and she had a fall. She does have a bruise on her left leg from the fall. She wonder if the numbness in her legs and chest discomfort may be coming from the blockage in her aorta. Patient was scheduled with Bernadene Person, NP 1/30 at 1:55 pm. Advise patient per ED precautions if symptoms worsen or new symptoms develop to go to the ED for evaluation and not to wait until her appt. Patient verbalized understanding and agree.

## 2023-08-27 NOTE — Telephone Encounter (Signed)
Pt states that she has been having numbness in both legs on/off for several weeks now and was at a appt and stood up and legs gave out on her resulting in her falling and requiring help to get up. She feels fine now just a slight scrap/bruise. Requesting cb

## 2023-09-02 NOTE — Progress Notes (Unsigned)
Cardiology Office Note    Date:  09/03/2023  ID:  Kara Bush, DOB 12-13-1940, MRN [id number] PCP:  Jackelyn Poling, DO  Cardiologist:  Chrystie Nose, MD  Electrophysiologist:  Lanier Prude, MD   Chief Complaint: Chest pain   History of Present Illness: Marland Kitchen    Kara Bush is a 83 y.o. female with visit-pertinent history of coronary artery disease s/p CABG (LIMA-LAD, SVG-diag, SVG-RCA) in 1998, aortic aneurysm s/p resection at time of CABG, PAD s/p iliac bypass in 1991 patent by last [place] in 2024, TIA/CVA in 02/2023, PE in 2/014, paroxysmal atrial fibrillation, hyperlipidemia and hypertension.  Echocardiogram in 08/2017 showed an EF of 60 to 65%, G1 DD, mild AI and mild MR.  CTA in 09/2020 showed dilated thoracic aortic aneurysm at 4.1 cm and a 1 year follow-up study was recommended.  In 02/2021 she presented to [facility] in atrial fibrillation with RVR.  She reported heart rates in the range of 130 to 150 bpm.  Serial troponins were negative.  Her BNP was borderline at 263.1.  She received a dose of metoprolol tartrate 25 mg in the emergency room and converted back to sinus rhythm. Metoprolol succinate was increased to 50 mg daily.  Echo in 02/2021 showed an EF of 55 to 60%, moderate LVH, mild mitral valve regurgitation, trivial tricuspid valve regurgitation and mild dilation of the acing aortic root at 38 mm.  She was later restarted on CPAP therapy, now followed by Dr. Mayford Knife.  In April 2024 she was admitted for acute blood loss anemia and chest pain for which she received several units of PRBCs.  Eliquis was held and she underwent EGD which showed mild ulcerative colitis and a single nonbleeding colon lesion.  Eliquis was resumed at a reduced dose.  She was referred to Dr. Lalla Brothers for consideration of Watchman. It was also recommended she have outpatient PET stress, does not appear to have been completed. She was evaluated by Dr. Lalla Brothers on 01/23/2023 for consideration of watchman.   Given patient's history of unprovoked PE in 2014 a Watchman device would not eliminate need for anticoagulation.  On 02/04/2023 she presented to the ED with complaints of nausea, disorientation, slurred speech.  Initial EKG showed changes concerning for ischemia and STEMI was called.  Cardiology was consulted and STEMI was canceled given slightly elevated but flat troponin, felt to be demand ischemia.  Potassium was 2.1 and hemoglobin is normal.  Echo showed normal LV/RV function with no RWMA.  MRI of the brain showed small acute infarct.  Cardiology was consulted for patient complaints of dyspnea.  She is found to be in A-fib at time of consult.. Echocardiogram indicated LVEF of 55 to 60%, no RWMA, diastolic parameters were indeterminate, RV size and function was normal, there was normal pulmonary artery systolic pressures there was moderate aortic valve stenosis with partial fusion of right and left cusps.  She was started on Entresto and given IV Lasix with plan to restart home dose of p.o. Lasix.  Eliquis remained at reduced dose secondary to downtrending hemoglobin.  Hospital course creatinine worsened Entresto and Lasix was held.  Patient was discharged on 02/14/2023.  On 03/02/2023 she presented to the ED for progressive lower extremity swelling, shortness of breath and increased oxygen need.  She was noted to have elevated blood pressure and elevated BNP.  She was started on furosemide and admitted for CHF.  She was diuresed 8 L during admission.   Her Sherryll Burger was resumed  along with her Imdur and metoprolol.  She was restarted on Eliquis and spironolactone.  She was seen in clinic on 03/26/2023 by Edd Fabian, NP. Her hydralazine was decreased to 10 mg 3 times daily.  Kara Bush was last seen in clinic on 06/12/2023 by Dr. Rennis Golden.  She reported some dizzy episodes, she was hypotensive at 90/40, her hydralazine was discontinued.   Today she presents with concern for chest pain and her "legs giving out on  her" last week while at a doctors appointment.  Patient reports that her legs became very weak and she fell to the ground, she denies any presyncope or syncope.  She notes she did not pass out.  She notes that in recent weeks she has been having intermittent centralized and left chest discomfort, she notes that she had some prior to her appointment today.  She reports that she currently has some chest discomfort when she takes a deep breath in.  She also notes concern that she may be anemic as she notes that her chest discomfort and feeling of generalized fatigue is similar to how she felt prior to admission in 2024 for acute blood loss anemia.  She denies any known bleeding or dark stools.  She also notes concern for a possible blood clot in her left leg, notes that she has been adherent with Eliquis but she has had increased left leg pain. She denies any swelling or redness.   ROS: .   Today she denies shortness of breath, lower extremity edema, palpitations, melena, hematuria, hemoptysis, diaphoresis, presyncope, syncope, orthopnea, and PND.  All other systems are reviewed and otherwise negative.  Studies Reviewed: Marland Kitchen    EKG:  EKG is ordered today, personally reviewed, demonstrating  EKG Interpretation Date/Time:  Thursday September 03 2023 14:53:23 EST Ventricular Rate:  80 PR Interval:  240 QRS Duration:  82 QT Interval:  388 QTC Calculation: 447 R Axis:   -2  Text Interpretation: Sinus rhythm with 1st degree A-V block with Premature atrial complexes ST & T wave abnormality, consider lateral ischemia When compared with ECG of 12-Jun-2023 13:27, Premature atrial complexes are now Present PR interval has increased T wave inversion more evident in Lateral leads ST depression more evident in lateral leads Confirmed by Reather Littler [phone]) on 09/03/2023 5:26:04 PM   CV Studies:  Cardiac Studies & Procedures     STRESS TESTS  NM MYOCAR MULTI W/SPECT W 08/07/2011  ECHOCARDIOGRAM  ECHOCARDIOGRAM  COMPLETE 02/05/2023  Narrative ECHOCARDIOGRAM REPORT    Patient Name:   Kara Bush Date of Exam: 02/05/2023 Medical Rec #:  [id number]          Height:       64.0 in Accession #:    [id number]         Weight:       176.8 lb Date of Birth:  05-Nov-1940         BSA:          1.857 m Patient Age:    81 years           BP:           161/67 mmHg Patient Gender: F                  HR:           86 bpm. Exam Location:  Inpatient  Procedure: 2D Echo, Cardiac Doppler and Color Doppler  Indications:    elevated troponin  History:  Patient has prior history of Echocardiogram examinations, most recent 11/23/2022. Prior CABG, PAD and chronic kidney disease, Arrythmias:Atrial Fibrillation, Signs/Symptoms:Chest Pain; Risk Factors:Sleep Apnea and Dyslipidemia.  Sonographer:    Delcie Roch RDCS Referring Phys: [id number] ANASTASSIA DOUTOVA  IMPRESSIONS   1. Left ventricular ejection fraction, by estimation, is 55 to 60%. The left ventricle has normal function. The left ventricle has no regional wall motion abnormalities. Left ventricular diastolic parameters are indeterminate. 2. Right ventricular systolic function is normal. The right ventricular size is normal. There is normal pulmonary artery systolic pressure. 3. Left atrial size was mildly dilated. 4. The mitral valve is degenerative. Mild mitral valve regurgitation. No evidence of mitral stenosis. 5. Partial fusion of right and left cusps. The aortic valve is tricuspid. There is moderate calcification of the aortic valve. There is moderate thickening of the aortic valve. Aortic valve regurgitation is mild. Moderate aortic valve stenosis. 6. The inferior vena cava is normal in size with greater than 50% respiratory variability, suggesting right atrial pressure of 3 mmHg.  FINDINGS Left Ventricle: Left ventricular ejection fraction, by estimation, is 55 to 60%. The left ventricle has normal function. The left ventricle has no  regional wall motion abnormalities. The left ventricular internal cavity size was normal in size. There is no left ventricular hypertrophy. Left ventricular diastolic parameters are indeterminate.  Right Ventricle: The right ventricular size is normal. No increase in right ventricular wall thickness. Right ventricular systolic function is normal. There is normal pulmonary artery systolic pressure. The tricuspid regurgitant velocity is 2.69 m/s, and with an assumed right atrial pressure of 3 mmHg, the estimated right ventricular systolic pressure is 31.9 mmHg.  Left Atrium: Left atrial size was mildly dilated.  Right Atrium: Right atrial size was normal in size.  Pericardium: There is no evidence of pericardial effusion.  Mitral Valve: The mitral valve is degenerative in appearance. There is mild thickening of the mitral valve leaflet(s). There is mild calcification of the mitral valve leaflet(s). Mild mitral valve regurgitation. No evidence of mitral valve stenosis.  Tricuspid Valve: The tricuspid valve is normal in structure. Tricuspid valve regurgitation is not demonstrated. No evidence of tricuspid stenosis.  Aortic Valve: Partial fusion of right and left cusps. The aortic valve is tricuspid. There is moderate calcification of the aortic valve. There is moderate thickening of the aortic valve. Aortic valve regurgitation is mild. Moderate aortic stenosis is present. Aortic valve mean gradient measures 16.5 mmHg. Aortic valve peak gradient measures 27.8 mmHg. Aortic valve area, by VTI measures 1.19 cm.  Pulmonic Valve: The pulmonic valve was normal in structure. Pulmonic valve regurgitation is mild. No evidence of pulmonic stenosis.  Aorta: The aortic root is normal in size and structure.  Venous: The inferior vena cava is normal in size with greater than 50% respiratory variability, suggesting right atrial pressure of 3 mmHg.  IAS/Shunts: No atrial level shunt detected by color flow  Doppler.   LEFT VENTRICLE PLAX 2D LVIDd:         4.40 cm LVIDs:         3.50 cm LV PW:         1.30 cm LV IVS:        1.10 cm LVOT diam:     2.20 cm LV SV:         60 LV SV Index:   32 LVOT Area:     3.80 cm   RIGHT VENTRICLE          IVC RV Basal  diam:  2.90 cm  IVC diam: 2.00 cm  LEFT ATRIUM             Index        RIGHT ATRIUM           Index LA diam:        4.10 cm 2.21 cm/m   RA Area:     16.10 cm LA Vol (A2C):   81.5 ml 43.90 ml/m  RA Volume:   39.40 ml  21.22 ml/m LA Vol (A4C):   66.2 ml 35.66 ml/m LA Biplane Vol: 73.3 ml 39.48 ml/m AORTIC VALVE AV Area (Vmax):    1.16 cm AV Area (Vmean):   1.11 cm AV Area (VTI):     1.19 cm AV Vmax:           263.75 cm/s AV Vmean:          187.500 cm/s AV VTI:            0.503 m AV Peak Grad:      27.8 mmHg AV Mean Grad:      16.5 mmHg LVOT Vmax:         80.57 cm/s LVOT Vmean:        54.833 cm/s LVOT VTI:          0.158 m LVOT/AV VTI ratio: 0.31  AORTA Ao Root diam: 3.20 cm Ao Asc diam:  3.70 cm  TRICUSPID VALVE TR Peak grad:   28.9 mmHg TR Vmax:        269.00 cm/s  SHUNTS Systemic VTI:  0.16 m Systemic Diam: 2.20 cm  Charlton Haws MD Electronically signed by Charlton Haws MD Signature Date/Time: 02/05/2023/2:33:00 PM    Final   MONITORS  LONG TERM MONITOR (3-14 DAYS) 09/16/2018  Narrative Monitor shows paroxysmal afib.  Chrystie Nose, MD, [facility], FACP [place]  [facility] Medical Director of the [facility] Diplomate of the American Board of Clinical Lipidology Attending Cardiologist Direct Dial: [phone]  Fax: [phone] Website:  [email or web address]           Current Reported Medications:.    Current Meds  Medication Sig   acetaminophen (TYLENOL) 500 MG tablet Take 500 mg by mouth as needed.   apixaban (ELIQUIS) 5 MG TABS tablet Take 1 tablet (5 mg total) by mouth 2 (two) times daily.   apixaban (ELIQUIS) 5 MG TABS tablet     atorvastatin (LIPITOR) 40 MG tablet Take 1 tablet (40 mg total) by mouth daily.   B Complex Vitamins (VITAMIN B COMPLEX) TABS 100 mg 1 tablet by mouth once a day   b complex vitamins capsule Take 1 capsule by mouth daily.   BOOSTRIX 5-2.5-18.5 LF-MCG/0.5 injection    calcium carbonate (TUMS) 500 MG chewable tablet 1 tablet Orally Once a day As needed   cephALEXin (KEFLEX) 500 MG capsule Take 500 mg by mouth every 12 (twelve) hours.   clobetasol ointment (TEMOVATE) 0.05 % Apply 1 Application topically 2 (two) times daily.   furosemide (LASIX) 40 MG tablet Take 1 tablet (40 mg total) by mouth every Monday, Wednesday, and Friday.   HYDROcodone-acetaminophen (NORCO/VICODIN) 5-325 MG tablet Take 1 tablet by mouth 4 (four) times daily as needed for moderate pain (pain score 4-6). May Take an extra 0.5 to one tablet when pain is severe.   ibuprofen (ADVIL) 400 MG tablet    isosorbide mononitrate (IMDUR) 30 MG 24 hr tablet Take 1 tablet (30 mg total) by  mouth daily.   methocarbamol (ROBAXIN) 500 MG tablet 1 capsule orally every 8 hrs as needed   metoprolol succinate (TOPROL-XL) 25 MG 24 hr tablet Take 3 tablets (75 mg total) by mouth daily. Take with or immediately following a meal.   Multiple Vitamin (MULITIVITAMIN WITH MINERALS) TABS Take 1 tablet by mouth daily.   MULTIPLE VITAMIN PO    ondansetron (ZOFRAN-ODT) 4 MG disintegrating tablet Take 1 tablet (4 mg total) by mouth every 8 (eight) hours.   pantoprazole (PROTONIX) 40 MG tablet Take 1 tablet (40 mg total) by mouth 2 (two) times daily.   potassium chloride (KLOR-CON M) 10 MEQ tablet Take 1 tablet (10 mEq total) by mouth every Monday, Wednesday, and Friday.   sacubitril-valsartan (ENTRESTO) 24-26 MG 1 tablet Oral Twice a day   SHINGRIX injection    spironolactone (ALDACTONE) 25 MG tablet Take 12.5 mg by mouth daily.    Physical Exam:    VS:  BP (!) 108/50 (BP Location: Right Arm, Patient Position: Standing, Cuff Size: Normal)   Pulse 85    Ht 5' 3.5" (1.613 m)   Wt 165 lb (74.8 kg)   SpO2 96%   BMI 28.77 kg/m    Wt Readings from Last 3 Encounters:  09/03/23 165 lb (74.8 kg)  09/03/23 165 lb (74.8 kg)  08/20/23 167 lb (75.8 kg)    GEN: Well nourished, well developed in no acute distress NECK: No JVD; No carotid bruits CARDIAC: irregular RR, no murmurs, rubs, gallops RESPIRATORY:  Clear to auscultation without rales, wheezing or rhonchi  ABDOMEN: Soft, non-tender, non-distended EXTREMITIES:  No edema; No acute deformity   Asessement and Plan:.    Chest pain/CAD: S/p CABG x 3 in 1998.  Multiple PCI with stents in 2005, 2006, 2007.  Nuclear stress in 2014 was negative for ischemia.  Patient was admitted in 11/2022 with chest pain and acute blood loss anemia, it was recommended that she have outpatient PET stress, does not appear this was completed.  Today she presents with intermittent centralized and left-sided chest discomfort.  She also notes some chest discomfort at office appointment with a deep breath, appears pleuritic in nature.  Her EKG does show worsened ST depression in lateral leads. Patient is concerned she is again anemia, feels her symptoms are very similar. Discussed with Dr. Servando Salina, DOD today at [facility] who agreed with evaluation in the ED given pleuritic pain and other chest pain, with similar presentation to when she had acute anemia. Patient is agreeable to presenting to the ED, declined transport, her daughter will transport her. Continue Eliquis pending labs, Imdur, metoprolol, Entresto, Spironolactone and atorvastatin.   Chronic diastolic CHF: Echocardiogram in 02/05/23 indicated LVEF of 55 to 60%, no RWMA, diastolic parameters were indeterminate, RV size and function was normal, there was normal pulmonary artery systolic pressures there was moderate aortic valve stenosis with partial fusion of right and left cusps.  Today she denies shortness of breath, lower extremity edema, orthopnea or PND. She appears  euvolemic on exam. Continue current medications as above.   Atrial flutter/fibrillation: Patient appears to be cardiac unaware when she is in A-fib aside "from not feeling right". EKG today indicates sinus rhythm, she denies any recent palpitations.  Reports adherence with Eliquis, she is concerned that she may have GI bleeding although has not noted any dark stools or frank blood.  HTN: Blood pressure today 108/50.  Patient had slight drop in blood pressure from sitting to standing at 104/50 to 91/46 with quick  recovery back to 108/50.  Patient has noted some increased dizziness and lightheadedness.  She is concerned about potential anemia, she notes she feels similar to her hospital admission last year.  She will be evaluated in the ED.  Peripheral arterial disease: s/p aortic iliac bypass in 1991.  Vascular ultrasound aorta/IVC/iliacs on 12/17/2022 indicated suboptimal exam, bilateral external iliac arteries appear patent with an increased velocity of the proximal left EIA.  >50% stenosis proximal aorta area. CTA chest abdomen pelvis on 02/04/2023: Aorto iliac bypass graft appeared patent. Followed by Dr. Randie Heinz.   Thoracic aortic aneurysm: CTA chest abdomen pelvis in 02/04/2023 indicated 4.2 cm fusiform aneurysm of ascending thoracic aorta.  She is having annual imaging.    Disposition: Patient to present to ED for further evaluation.   Signed, Rip Harbour, NP

## 2023-09-03 ENCOUNTER — Observation Stay (HOSPITAL_COMMUNITY)
Admission: EM | Admit: 2023-09-03 | Discharge: 2023-09-04 | Disposition: A | Discharge status: Home / Self Care | Payer: PPO | Attending: Internal Medicine | Admitting: Internal Medicine

## 2023-09-03 ENCOUNTER — Emergency Department (HOSPITAL_COMMUNITY): Payer: PPO

## 2023-09-03 ENCOUNTER — Encounter: Payer: Self-pay | Admitting: Cardiology

## 2023-09-03 ENCOUNTER — Ambulatory Visit: Payer: PPO | Admitting: Nurse Practitioner

## 2023-09-03 ENCOUNTER — Ambulatory Visit: Payer: PPO | Attending: Cardiology | Admitting: Cardiology

## 2023-09-03 ENCOUNTER — Encounter (HOSPITAL_COMMUNITY): Payer: Self-pay

## 2023-09-03 ENCOUNTER — Other Ambulatory Visit: Payer: Self-pay

## 2023-09-03 VITALS — BP 108/50 | HR 85 | Ht 63.5 in | Wt 165.0 lb

## 2023-09-03 DIAGNOSIS — I10 Essential (primary) hypertension: Secondary | ICD-10-CM

## 2023-09-03 DIAGNOSIS — I5022 Chronic systolic (congestive) heart failure: Secondary | ICD-10-CM | POA: Diagnosis not present

## 2023-09-03 DIAGNOSIS — R040 Epistaxis: Principal | ICD-10-CM | POA: Insufficient documentation

## 2023-09-03 DIAGNOSIS — D631 Anemia in chronic kidney disease: Secondary | ICD-10-CM | POA: Insufficient documentation

## 2023-09-03 DIAGNOSIS — R079 Chest pain, unspecified: Secondary | ICD-10-CM | POA: Diagnosis not present

## 2023-09-03 DIAGNOSIS — K5190 Ulcerative colitis, unspecified, without complications: Secondary | ICD-10-CM | POA: Insufficient documentation

## 2023-09-03 DIAGNOSIS — N183 Chronic kidney disease, stage 3 unspecified: Secondary | ICD-10-CM | POA: Diagnosis present

## 2023-09-03 DIAGNOSIS — K219 Gastro-esophageal reflux disease without esophagitis: Secondary | ICD-10-CM | POA: Insufficient documentation

## 2023-09-03 DIAGNOSIS — Z7982 Long term (current) use of aspirin: Secondary | ICD-10-CM | POA: Diagnosis not present

## 2023-09-03 DIAGNOSIS — I480 Paroxysmal atrial fibrillation: Secondary | ICD-10-CM | POA: Insufficient documentation

## 2023-09-03 DIAGNOSIS — M7989 Other specified soft tissue disorders: Secondary | ICD-10-CM

## 2023-09-03 DIAGNOSIS — E785 Hyperlipidemia, unspecified: Secondary | ICD-10-CM | POA: Insufficient documentation

## 2023-09-03 DIAGNOSIS — Z87891 Personal history of nicotine dependence: Secondary | ICD-10-CM | POA: Insufficient documentation

## 2023-09-03 DIAGNOSIS — F32A Depression, unspecified: Secondary | ICD-10-CM | POA: Diagnosis not present

## 2023-09-03 DIAGNOSIS — R531 Weakness: Secondary | ICD-10-CM | POA: Diagnosis not present

## 2023-09-03 DIAGNOSIS — I5032 Chronic diastolic (congestive) heart failure: Secondary | ICD-10-CM

## 2023-09-03 DIAGNOSIS — I739 Peripheral vascular disease, unspecified: Secondary | ICD-10-CM

## 2023-09-03 DIAGNOSIS — I5042 Chronic combined systolic (congestive) and diastolic (congestive) heart failure: Secondary | ICD-10-CM

## 2023-09-03 DIAGNOSIS — I2510 Atherosclerotic heart disease of native coronary artery without angina pectoris: Secondary | ICD-10-CM | POA: Diagnosis not present

## 2023-09-03 DIAGNOSIS — Z86711 Personal history of pulmonary embolism: Secondary | ICD-10-CM | POA: Insufficient documentation

## 2023-09-03 DIAGNOSIS — R0789 Other chest pain: Secondary | ICD-10-CM | POA: Diagnosis present

## 2023-09-03 DIAGNOSIS — I25119 Atherosclerotic heart disease of native coronary artery with unspecified angina pectoris: Secondary | ICD-10-CM

## 2023-09-03 DIAGNOSIS — G4733 Obstructive sleep apnea (adult) (pediatric): Secondary | ICD-10-CM | POA: Diagnosis not present

## 2023-09-03 DIAGNOSIS — R0781 Pleurodynia: Secondary | ICD-10-CM | POA: Diagnosis not present

## 2023-09-03 DIAGNOSIS — I712 Thoracic aortic aneurysm, without rupture, unspecified: Secondary | ICD-10-CM

## 2023-09-03 DIAGNOSIS — Z951 Presence of aortocoronary bypass graft: Secondary | ICD-10-CM | POA: Insufficient documentation

## 2023-09-03 DIAGNOSIS — Z85828 Personal history of other malignant neoplasm of skin: Secondary | ICD-10-CM | POA: Diagnosis not present

## 2023-09-03 DIAGNOSIS — M797 Fibromyalgia: Secondary | ICD-10-CM | POA: Diagnosis not present

## 2023-09-03 DIAGNOSIS — D649 Anemia, unspecified: Secondary | ICD-10-CM | POA: Diagnosis present

## 2023-09-03 DIAGNOSIS — Z79899 Other long term (current) drug therapy: Secondary | ICD-10-CM | POA: Insufficient documentation

## 2023-09-03 DIAGNOSIS — I130 Hypertensive heart and chronic kidney disease with heart failure and stage 1 through stage 4 chronic kidney disease, or unspecified chronic kidney disease: Secondary | ICD-10-CM | POA: Diagnosis not present

## 2023-09-03 DIAGNOSIS — Z8673 Personal history of transient ischemic attack (TIA), and cerebral infarction without residual deficits: Secondary | ICD-10-CM | POA: Insufficient documentation

## 2023-09-03 DIAGNOSIS — I7121 Aneurysm of the ascending aorta, without rupture: Secondary | ICD-10-CM | POA: Diagnosis not present

## 2023-09-03 LAB — BASIC METABOLIC PANEL
Anion gap: 11 (ref 5–15)
BUN: 23 mg/dL (ref 8–23)
CO2: 24 mmol/L (ref 22–32)
Calcium: 9 mg/dL (ref 8.9–10.3)
Chloride: 103 mmol/L (ref 98–111)
Creatinine, Ser: 1.57 mg/dL — ABNORMAL HIGH (ref 0.44–1.00)
GFR, Estimated: 32 mL/min — ABNORMAL LOW (ref >60)
Glucose, Bld: 113 mg/dL — ABNORMAL HIGH (ref 70–99)
Potassium: 4.3 mmol/L (ref 3.5–5.1)
Sodium: 138 mmol/L (ref 135–145)

## 2023-09-03 LAB — CBC
HCT: 21.7 % — ABNORMAL LOW (ref 36.0–46.0)
Hemoglobin: 5.8 g/dL — CL (ref 12.0–15.0)
MCH: 16 pg — ABNORMAL LOW (ref 26.0–34.0)
MCHC: 26.7 g/dL — ABNORMAL LOW (ref 30.0–36.0)
MCV: 59.8 fL — ABNORMAL LOW (ref 80.0–100.0)
Platelets: 186 10*3/uL (ref 150–400)
RBC: 3.63 MIL/uL — ABNORMAL LOW (ref 3.87–5.11)
RDW: 19.3 % — ABNORMAL HIGH (ref 11.5–15.5)
WBC: 8.7 10*3/uL (ref 4.0–10.5)
nRBC: 0 % (ref 0.0–0.2)

## 2023-09-03 LAB — PREPARE RBC (CROSSMATCH)

## 2023-09-03 LAB — RETICULOCYTES
Immature Retic Fract: 19.6 % — ABNORMAL HIGH (ref 2.3–15.9)
RBC.: 3.37 MIL/uL — ABNORMAL LOW (ref 3.87–5.11)
Retic Count, Absolute: 35.7 10*3/uL (ref 19.0–186.0)
Retic Ct Pct: 1.1 % (ref 0.4–3.1)

## 2023-09-03 LAB — IRON AND TIBC
Iron: 17 ug/dL — ABNORMAL LOW (ref 28–170)
Saturation Ratios: 4 % — ABNORMAL LOW (ref 10.4–31.8)
TIBC: 489 ug/dL — ABNORMAL HIGH (ref 250–450)
UIBC: 472 ug/dL

## 2023-09-03 LAB — POC OCCULT BLOOD, ED: Fecal Occult Bld: NEGATIVE

## 2023-09-03 LAB — FERRITIN: Ferritin: 2 ng/mL — ABNORMAL LOW (ref 11–307)

## 2023-09-03 LAB — TROPONIN I (HIGH SENSITIVITY)
Troponin I (High Sensitivity): 12 ng/L (ref <18)
Troponin I (High Sensitivity): 13 ng/L (ref <18)

## 2023-09-03 LAB — CBG MONITORING, ED: Glucose-Capillary: 90 mg/dL (ref 70–99)

## 2023-09-03 LAB — D-DIMER, QUANTITATIVE: D-Dimer, Quant: 0.4 ug{FEU}/mL (ref 0.00–0.50)

## 2023-09-03 MED ORDER — NITROGLYCERIN 0.4 MG SL SUBL: 0.4000 mg | SUBLINGUAL_TABLET | SUBLINGUAL | Status: DC | PRN

## 2023-09-03 MED ORDER — SODIUM CHLORIDE 0.9% IV SOLUTION: Freq: Once | INTRAVENOUS | Status: DC

## 2023-09-03 MED ORDER — ACETAMINOPHEN 650 MG RE SUPP: 650.0000 mg | Freq: Four times a day (QID) | RECTAL | Status: DC | PRN

## 2023-09-03 MED ORDER — SODIUM CHLORIDE 0.9% IV SOLUTION: Freq: Once | INTRAVENOUS | Status: AC

## 2023-09-03 MED ORDER — ACETAMINOPHEN 325 MG PO TABS
650.0000 mg | ORAL_TABLET | Freq: Four times a day (QID) | ORAL | Status: DC | PRN
Start: 2023-09-03 — End: 2023-09-04

## 2023-09-03 NOTE — ED Provider Notes (Cosign Needed)
[place] EMERGENCY DEPARTMENT AT [name] Provider Note  MDM   HPI/ROS:  Kara Bush is a 83 y.o. female with pertinent past medical history of  coronary artery disease s/p CABG (LIMA-LAD, SVG-diag, SVG-RCA) in 1998, aortic aneurysm s/p resection at time of CABG, PAD s/p iliac bypass in 1991 patent by last [place] in 2024, TIA/CVA in 02/2023, PE in 2/014, paroxysmal atrial fibrillation, hyperlipidemia and hypertension  who presents for evaluation of chest pain. Patient reports a 1 week history of intermittent sharp left-sided chest pain.  She states the pain is pleuritic and is associated with a mild cough, lightheadedness, but not dyspnea, diaphoresis, fever, chills.  Her daughter states her blood pressure is also been lower than normal around the 100s/40s-50s.  Her daughter notes that last time she had similar symptoms in 11/2018 for she was found to be anemic and required blood transfusion x 3.  Per chart review, EGD at that time did show mild ulcerative colitis and a single nonbleeding colon lesion and her Eliquis was ultimately resumed at a reduced dose. She presented to her cardiologist today for evaluation and were subsequently sent to the ED for evaluation due to her symptoms and reported EKG changes (increased ST depression in lateral leads).  She denies any hematemesis, dysuria, hematuria, melena, hematochezia.  She denies any increased leg swelling though does note she had a ground-level fall last week and has a focal area of erythema and tenderness over her left lateral leg.  Physical exam is notable for: - Focal area of mild erythema, ecchymosis, and tenderness over the left lateral lower leg.  No tenderness over the calf -2+ pulses all 4 extremities -Regular rate, irregularly irregular rhythm  On my initial evaluation, patient is:  -Vital signs stable. Patient afebrile, hemodynamically stable, and non-toxic appearing. -Additional history obtained from patient's daughter and  review of prior records -Labs reviewed: WBC 8.7, hemoglobin 5.8, MCV 59.8, platelets 186, creatinine 1.57, normal electrolytes, troponin 13 -Imaging reviewed: Negative lower extremity DVT study; chest x-ray is no evidence of pneumonia, pneumothorax, pulmonary edema  This patient's current presentation, including their history and physical exam, is most consistent with chest pain do most likely due to anemia of unclear etiology.  Based on normocytic anemia suspect may be component of iron deficiency anemia.  No obvious sources of active bleeding and patient with negative Hemoccult as well as normal rectal exam.  Considered ACS given EKG changes, though, overall less likely and CKD currently appears stable from at cardiologist office earlier today.  Initial troponin negative at 13, delta troponin pending.  Can additionally consider PE given pleuritic nature of pain; patient low risk by Wells criteria, unable to [facility] out given age and history of prior PE said will order D-dimer, though this is overall felt less likely given negative DVT ultrasound as above as well as compliance with her Eliquis.  Workup otherwise with stable creatinine, other cell lines within normal limits so unlikely aplastic anemia, leukemia, multiple myeloma, etc.  Creatinine mildly elevated but stable from prior.  Patient was consented for blood and initial unit PRBCs ordered.  She was subsequently admitted to the hospitalist service Dr. Loney Loh.  Please see their note for further treatment plan details.  Patient remained stable and had no acute events while under my care in the emergency department.  Disposition:  I discussed the case with the hospitalist service who graciously agreed to admit the patient to their service for continued care.   Clinical Impression:  1.  Epistaxis   2. Anemia, unspecified type     Rx / DC Orders ED Discharge Orders     None       The plan for this patient was discussed with Dr. Dalene Seltzer,  who voiced agreement and who oversaw evaluation and treatment of this patient.   Clinical Complexity A medically appropriate history, review of systems, and physical exam was performed.  My independent interpretations of EKG, labs, and radiology are documented in the ED course above.   If decision rules were used in this patient's evaluation, they are listed below.  HEART score 5  Patient's presentation is most consistent with acute presentation with potential threat to life or bodily function.  Medical Decision Making Amount and/or Complexity of Data Reviewed Labs: ordered. Radiology: ordered.  Risk Decision regarding hospitalization.    HPI/ROS      See MDM section for pertinent HPI and ROS. A complete ROS was performed with pertinent positives/negatives noted above.   Past Medical History:  Diagnosis Date   Abdominal aortic aneurysm (HCC)    Anemia, improved 01/11/2013   Basal cell carcinoma    CAD (coronary artery disease)    hx CABG 1998, last cath 2007 with PCI and stenting to the proximal segment of the vein graft to the diagonal branch. DES Taxus was placed   Cervicalgia    Chronic fatigue fibromyalgia syndrome    Chronic pain syndrome    Depression    Fibromyalgia    History of nuclear stress test 08/07/2011   lexiscan; no evidence of ischemia or infarct; low risk    Hyperlipidemia    Hypertension    OSA (obstructive sleep apnea)    PAD (peripheral artery disease) (HCC)    Pes anserine bursitis    Pulmonary embolism (HCC) 01/11/2013   S/P resection of aortic aneurysm    Trochanteric bursitis    Ulcerative colitis [facility])     Past Surgical History:  Procedure Laterality Date   AORTOBIEXTERNAL ILIAC BYPASS  1991   BIOPSY  11/25/2022   Procedure: BIOPSY;  Surgeon: Jeani Hawking, MD;  Location: [facility] ENDOSCOPY;  Service: Gastroenterology;;   CATARACT EXTRACTION Left 09/2019   CHOLECYSTECTOMY  1997   COLONOSCOPY WITH PROPOFOL N/A 11/25/2022   Procedure:  COLONOSCOPY WITH PROPOFOL;  Surgeon: Jeani Hawking, MD;  Location: [facility] ENDOSCOPY;  Service: Gastroenterology;  Laterality: N/A;   CORONARY ANGIOPLASTY WITH STENT PLACEMENT  2006   to LAD, ATRETIC LIMA AT THAT TIME ALSO   CORONARY ANGIOPLASTY WITH STENT PLACEMENT  2007   STENT TO PROX VG-DIAG, OTHER STENTS PATENT   CORONARY ARTERY BYPASS GRAFT  1998   LIMA-LAD;VG-DIAG & RCA   CORONARY STENT PLACEMENT  2005   TO LAD & LCX-STENTS,   ESOPHAGOGASTRODUODENOSCOPY (EGD) WITH PROPOFOL N/A 11/25/2022   Procedure: ESOPHAGOGASTRODUODENOSCOPY (EGD) WITH PROPOFOL;  Surgeon: Jeani Hawking, MD;  Location: [facility] ENDOSCOPY;  Service: Gastroenterology;  Laterality: N/A;   HERNIA REPAIR     TUBAL LIGATION     BTL      Physical Exam   Vitals:   09/03/23 1610 09/03/23 1615 09/03/23 1749  BP: (!) 116/41  (!) 159/64  Pulse: 70  73  Resp: 17  15  Temp: 98.1 F (36.7 C)  98.3 F (36.8 C)  TempSrc: Oral  Oral  SpO2: 100%  97%  Weight:  74.8 kg   Height:  5' 3.5" (1.613 m)     Physical Exam Gen: NAD. Appears comfortable HENT: Conjunctiva clear, PERRL, EOMI. MMM.  CV: Regular  rate.  Irregularly irregular rhythm. No M/R/G Pulm: Lungs CTAB with no wheezing, rales, or rhonchi.  GI: Abdomen soft, non-tender, non-distended. Normal bowel sounds in all 4 quadrants. MSK/Skin: No lower extremity edema. Extremities warm, well-perfused with 2+ pulses in all 4 extremities. Focal area of mild erythema, ecchymosis, and tenderness over the left lateral lower leg.  No tenderness over the calf Neuro: A&Ox3. GCS 15. Moves all extremities.     Procedures   If procedures were preformed on this patient, they are listed below:  Procedures   Mikeal Hawthorne, MD Emergency Medicine PGY-2   Please note that this documentation was produced with the assistance of voice-to-text technology and may contain errors.    Mikeal Hawthorne, MD 09/04/23 [phone]

## 2023-09-03 NOTE — Patient Instructions (Signed)
Medication Instructions:  No changes *If you need a refill on your cardiac medications before your next appointment, please call your pharmacy*   Lab Work: No labs If you have labs (blood work) drawn today and your tests are completely normal, you will receive your results only by: MyChart Message (if you have MyChart) OR A paper copy in the mail If you have any lab test that is abnormal or we need to change your treatment, we will call you to review the results.   Testing/Procedures: No testing   Follow-Up: At [facility], you and your health needs are our priority.  As part of our continuing mission to provide you with exceptional heart care, we have created designated Provider Care Teams.  These Care Teams include your primary Cardiologist (physician) and Advanced Practice Providers (APPs -  Physician Assistants and Nurse Practitioners) who all work together to provide you with the care you need, when you need it.  Other Instructions

## 2023-09-03 NOTE — H&P (Signed)
History and Physical    Kara Bush:[id number] DOB: [birth year] DOA: 09/03/2023  PCP: Jackelyn Poling, DO  Patient coming from: Home  Chief Complaint: Chest pain  HPI: Kara Bush is a 83 y.o. female with medical history significant of CAD status post CABG x 3 in 1998 and multiple PCIs with stents in 2005, 2006, and 2007.  Also history of chronic HFpEF, thoracic aortic aneurysm, PAD status post iliac bypass, TIA/CVA, PE, paroxysmal A-fib/flutter on Eliquis, hyperlipidemia, hypertension, ulcerative colitis, anemia, depression, fibromyalgia, chronic pain syndrome, OSA on CPAP, GERD, CKD stage IIIb seen by cardiology today for chest pain and generalized weakness.  EKG done in the office was concerning for worsening ST depressions in lateral leads.  Blood pressure has slightly dropped from 104/50 sitting to 91/46 standing with quick recovery back to 108/50.  She was sent to the ED for further evaluation.  Vital signs on arrival to the ED: Temperature 98.1 F, pulse 70, respiratory rate 17, blood pressure 116/41, and SpO2 100% on room air.  Labs notable for hemoglobin 5.8, MCV 59.8, FOBT negative, creatinine 1.5 (stable), initial troponin negative and repeat pending.  Chest x-ray showing no active cardiopulmonary disease.  Patient had an area of mild swelling and erythema on her left calf since after a fall a week ago and Doppler ultrasound negative for DVT.  EKG showing sinus rhythm and ST depressions in lateral leads.  1 unit PRBCs ordered in the ED.  TRH called to admit for symptomatic anemia.    Patient reports history of chronic chest pain. She reports experiencing left-sided squeezing chest pain intermittently for a long time which is nonexertional in nature. Sometimes she starts sweating when she gets the pain but denies any associated shortness of breath. She reports history of PE about 8 or 9 years ago but denies any pleuritic chest pain. States she had a fall at her doctor's office a  week ago and had bumped her left lower lateral leg against an object and since then has some mild erythema and soreness in that area.  Denies head injury or loss of consciousness.  She is compliant with Eliquis and last dose was this morning. Patient states she was found anemic in April of last year as well and had received several blood transfusions. States she had extensive workup done at that time and no source of GI bleed was found. She thinks her anemia could be related to nutritional deficiencies as her appetite is chronically poor.  She does take a B-complex supplement regularly.  She denies hematemesis, hematochezia, melena, hematuria, or any other obvious bleeding. Denies abdominal pain.   Review of Systems:  Review of Systems  All other systems reviewed and are negative.   Past Medical History:  Diagnosis Date   Abdominal aortic aneurysm (HCC)    Anemia, improved 01/11/2013   Basal cell carcinoma    CAD (coronary artery disease)    hx CABG 1998, last cath 2007 with PCI and stenting to the proximal segment of the vein graft to the diagonal branch. DES Taxus was placed   Cervicalgia    Chronic fatigue fibromyalgia syndrome    Chronic pain syndrome    Depression    Fibromyalgia    History of nuclear stress test 08/07/2011   lexiscan; no evidence of ischemia or infarct; low risk    Hyperlipidemia    Hypertension    OSA (obstructive sleep apnea)    PAD (peripheral artery disease) (HCC)    Pes anserine bursitis  Pulmonary embolism (HCC) 01/11/2013   S/P resection of aortic aneurysm    Trochanteric bursitis    Ulcerative colitis [facility])     Past Surgical History:  Procedure Laterality Date   AORTOBIEXTERNAL ILIAC BYPASS  1991   BIOPSY  11/25/2022   Procedure: BIOPSY;  Surgeon: Jeani Hawking, MD;  Location: [facility] ENDOSCOPY;  Service: Gastroenterology;;   CATARACT EXTRACTION Left 09/2019   CHOLECYSTECTOMY  1997   COLONOSCOPY WITH PROPOFOL N/A 11/25/2022   Procedure: COLONOSCOPY  WITH PROPOFOL;  Surgeon: Jeani Hawking, MD;  Location: [facility] ENDOSCOPY;  Service: Gastroenterology;  Laterality: N/A;   CORONARY ANGIOPLASTY WITH STENT PLACEMENT  2006   to LAD, ATRETIC LIMA AT THAT TIME ALSO   CORONARY ANGIOPLASTY WITH STENT PLACEMENT  2007   STENT TO PROX VG-DIAG, OTHER STENTS PATENT   CORONARY ARTERY BYPASS GRAFT  1998   LIMA-LAD;VG-DIAG & RCA   CORONARY STENT PLACEMENT  2005   TO LAD & LCX-STENTS,   ESOPHAGOGASTRODUODENOSCOPY (EGD) WITH PROPOFOL N/A 11/25/2022   Procedure: ESOPHAGOGASTRODUODENOSCOPY (EGD) WITH PROPOFOL;  Surgeon: Jeani Hawking, MD;  Location: [facility] ENDOSCOPY;  Service: Gastroenterology;  Laterality: N/A;   HERNIA REPAIR     TUBAL LIGATION     BTL     reports that she quit smoking about 33 years ago. Her smoking use included cigarettes. She started smoking about 53 years ago. She has a 50 pack-year smoking history. She has never been exposed to tobacco smoke. She has never used smokeless tobacco. She reports that she does not drink alcohol and does not use drugs.  Allergies  Allergen Reactions   Biotin Hives   Lisinopril Cough   Atenolol Other (See Comments)    colitis Other reaction(s): Bowel issues   Duloxetine Hcl Other (See Comments)    Mad pt feel spacey Other reaction(s): shaky; tired in the PMs   Lyrica [Pregabalin] Other (See Comments)    Made pt feel spacey   Penicillin G Rash    Other reaction(s): rash   Penicillins Rash   Sulfamethoxazole-Trimethoprim Nausea And Vomiting    Other reaction(s): elevated liver functions    Family History  Problem Relation Age of Onset   Heart failure Mother        at 65   Heart disease Father    Heart attack Father        @ 35   Healthy Sister    Cancer Brother        pancreatic    Cancer Paternal Aunt        BREAST   Heart failure Maternal Grandmother    Heart attack Maternal Grandfather    Heart attack Paternal Grandmother        at 30   Healthy Daughter    Healthy Daughter     Prior to  Admission medications   Medication Sig Start Date End Date Taking? Authorizing Provider  acetaminophen (TYLENOL) 500 MG tablet Take 500 mg by mouth as needed.    [provider]  apixaban (ELIQUIS) 5 MG TABS tablet Take 1 tablet (5 mg total) by mouth 2 (two) times daily. 03/26/23   Ronney Asters, NP  apixaban (ELIQUIS) 5 MG TABS tablet     [provider]  atorvastatin (LIPITOR) 40 MG tablet Take 1 tablet (40 mg total) by mouth daily. 03/26/23   Ronney Asters, NP  B Complex Vitamins (VITAMIN B COMPLEX) TABS 100 mg 1 tablet by mouth once a day    [provider]  b complex  vitamins capsule Take 1 capsule by mouth daily.    [provider]  Leda Min 5-2.5-18.5 LF-MCG/0.5 injection  12/11/22   [provider]  calcium carbonate (TUMS) 500 MG chewable tablet 1 tablet Orally Once a day As needed    [provider]  cephALEXin (KEFLEX) 500 MG capsule Take 500 mg by mouth every 12 (twelve) hours. 03/18/23   [provider]  citalopram (CELEXA) 20 MG tablet Take 1 tablet (20 mg total) by mouth daily. 02/14/23 03/17/23  Regalado, Belkys A, MD  clobetasol ointment (TEMOVATE) 0.05 % Apply 1 Application topically 2 (two) times daily. 04/16/23   [provider]  furosemide (LASIX) 40 MG tablet Take 1 tablet (40 mg total) by mouth every Monday, Wednesday, and Friday. 03/09/23   Danford, Earl Lites, MD  HYDROcodone-acetaminophen (NORCO/VICODIN) 5-325 MG tablet Take 1 tablet by mouth 4 (four) times daily as needed for moderate pain (pain score 4-6). May Take an extra 0.5 to one tablet when pain is severe. 08/20/23   Jones Bales, NP  ibuprofen (ADVIL) 400 MG tablet     [provider]  isosorbide mononitrate (IMDUR) 30 MG 24 hr tablet Take 1 tablet (30 mg total) by mouth daily. 03/26/23   Ronney Asters, NP  methocarbamol (ROBAXIN) 500 MG tablet 1 capsule orally every 8 hrs as needed    [provider]  metoprolol  succinate (TOPROL-XL) 25 MG 24 hr tablet Take 3 tablets (75 mg total) by mouth daily. Take with or immediately following a meal. 03/26/23   Cleaver, Thomasene Ripple, NP  Multiple Vitamin (MULITIVITAMIN WITH MINERALS) TABS Take 1 tablet by mouth daily.    [provider]  MULTIPLE VITAMIN PO     [provider]  ondansetron (ZOFRAN-ODT) 4 MG disintegrating tablet Take 1 tablet (4 mg total) by mouth every 8 (eight) hours. 02/14/23   Regalado, Belkys A, MD  pantoprazole (PROTONIX) 40 MG tablet Take 1 tablet (40 mg total) by mouth 2 (two) times daily. 02/14/23   Regalado, Jon Billings A, MD  potassium chloride (KLOR-CON M) 10 MEQ tablet Take 1 tablet (10 mEq total) by mouth every Monday, Wednesday, and Friday. 03/09/23   Danford, Earl Lites, MD  sacubitril-valsartan (ENTRESTO) 24-26 MG 1 tablet Oral Twice a day    [provider]  [facility] injection  12/11/22   [provider]  spironolactone (ALDACTONE) 25 MG tablet Take 12.5 mg by mouth daily.    [provider]  traZODone (DESYREL) 50 MG tablet Take 1 tablet (50 mg total) by mouth at bedtime as needed for sleep. 02/14/23 03/17/23  Alba Cory, MD    Physical Exam: Vitals:   09/03/23 1610 09/03/23 1615 09/03/23 1749  BP: (!) 116/41  (!) 159/64  Pulse: 70  73  Resp: 17  15  Temp: 98.1 F (36.7 C)  98.3 F (36.8 C)  TempSrc: Oral  Oral  SpO2: 100%  97%  Weight:  74.8 kg   Height:  5' 3.5" (1.613 m)     Physical Exam Vitals reviewed.  Constitutional:      General: She is not in acute distress. HENT:     Head: Normocephalic and atraumatic.  Eyes:     Extraocular Movements: Extraocular movements intact.  Cardiovascular:     Rate and Rhythm: Normal rate and regular rhythm.     Pulses: Normal pulses.  Pulmonary:     Effort: Pulmonary effort is normal. No respiratory distress.     Breath sounds: Normal  breath sounds. No wheezing or rales.  Abdominal:     General: Bowel sounds are normal. There is no  distension.     Palpations: Abdomen is soft.     Tenderness: There is no abdominal tenderness. There is no guarding.  Musculoskeletal:     Cervical back: Normal range of motion.     Right lower leg: No edema.     Left lower leg: No edema.  Skin:    General: Skin is warm and dry.  Neurological:     General: No focal deficit present.     Mental Status: She is alert and oriented to person, place, and time.     Labs on Admission: I have personally reviewed following labs and imaging studies  CBC: Recent Labs  Lab 09/03/23 1628  WBC 8.7  HGB 5.8*  HCT 21.7*  MCV 59.8*  PLT 186   Basic Metabolic Panel: Recent Labs  Lab 09/03/23 1628  NA 138  K 4.3  CL 103  CO2 24  GLUCOSE 113*  BUN 23  CREATININE 1.57*  CALCIUM 9.0   GFR: Estimated Creatinine Clearance: 26.1 mL/min (A) (by C-G formula based on SCr of 1.57 mg/dL (H)). Liver Function Tests: No results for input(s): "AST", "ALT", "ALKPHOS", "BILITOT", "PROT", "ALBUMIN" in the last 168 hours. No results for input(s): "LIPASE", "AMYLASE" in the last 168 hours. No results for input(s): "AMMONIA" in the last 168 hours. Coagulation Profile: No results for input(s): "INR", "PROTIME" in the last 168 hours. Cardiac Enzymes: No results for input(s): "CKTOTAL", "CKMB", "CKMBINDEX", "TROPONINI" in the last 168 hours. BNP (last 3 results) No results for input(s): "PROBNP" in the last 8760 hours. HbA1C: No results for input(s): "HGBA1C" in the last 72 hours. CBG: No results for input(s): "GLUCAP" in the last 168 hours. Lipid Profile: No results for input(s): "CHOL", "HDL", "LDLCALC", "TRIG", "CHOLHDL", "LDLDIRECT" in the last 72 hours. Thyroid Function Tests: No results for input(s): "TSH", "T4TOTAL", "FREET4", "T3FREE", "THYROIDAB" in the last 72 hours. Anemia Panel: No results for input(s): "VITAMINB12", "FOLATE", "FERRITIN", "TIBC", "IRON", "RETICCTPCT" in the last 72 hours. Urine analysis:    Component Value Date/Time    COLORURINE STRAW (A) 02/13/2023 0835   APPEARANCEUR CLEAR 02/13/2023 0835   LABSPEC 1.009 02/13/2023 0835   PHURINE 5.0 02/13/2023 0835   GLUCOSEU 50 (A) 02/13/2023 0835   HGBUR NEGATIVE 02/13/2023 0835   BILIRUBINUR NEGATIVE 02/13/2023 0835   KETONESUR NEGATIVE 02/13/2023 0835   PROTEINUR NEGATIVE 02/13/2023 0835   NITRITE NEGATIVE 02/13/2023 0835   LEUKOCYTESUR MODERATE (A) 02/13/2023 0835    Radiological Exams on Admission: VAS [place] LOWER EXTREMITY VENOUS (DVT) (ONLY [facility] & [facility]) Result Date: 09/03/2023  Lower Venous DVT Study Patient Name:  Kara Bush  Date of Exam:   09/03/2023 Medical Rec #: [id number]           Accession #:    [id number] Date of Birth: Jun 15, 1941          Patient Gender: F Patient Age:   67 years Exam Location:  [facility] Procedure:      VAS [place] LOWER EXTREMITY VENOUS (DVT) Referring Phys: Sabra Heck --------------------------------------------------------------------------------  Indications: Lateral ankle pain post fall.  Anticoagulation: Eliquis. Comparison Study: Previous exam on 09/23/2012 was negative for DVT. Performing Technologist: Ernestene Mention RVT, RDMS  Examination Guidelines: A complete evaluation includes B-mode imaging, spectral Doppler, color Doppler, and power Doppler as needed of all accessible portions of each vessel. Bilateral testing is considered an integral part of a  complete examination. Limited examinations for reoccurring indications may be performed as noted. The reflux portion of the exam is performed with the patient in reverse Trendelenburg.  +-----+---------------+---------+-----------+----------+--------------+ RIGHTCompressibilityPhasicitySpontaneityPropertiesThrombus Aging +-----+---------------+---------+-----------+----------+--------------+ CFV  Full           Yes      Yes                                 +-----+---------------+---------+-----------+----------+--------------+    +---------+---------------+---------+-----------+----------+-------------------+ LEFT     CompressibilityPhasicitySpontaneityPropertiesThrombus Aging      +---------+---------------+---------+-----------+----------+-------------------+ CFV      Full           Yes      Yes                                      +---------+---------------+---------+-----------+----------+-------------------+ SFJ      Full                                                             +---------+---------------+---------+-----------+----------+-------------------+ FV Prox  Full           Yes      Yes                                      +---------+---------------+---------+-----------+----------+-------------------+ FV Mid   Full           Yes      Yes                                      +---------+---------------+---------+-----------+----------+-------------------+ FV DistalFull           Yes      Yes                                      +---------+---------------+---------+-----------+----------+-------------------+ PFV      Full                                                             +---------+---------------+---------+-----------+----------+-------------------+ POP      Full           Yes      Yes                                      +---------+---------------+---------+-----------+----------+-------------------+ PTV      Full                                                             +---------+---------------+---------+-----------+----------+-------------------+  PERO                                                  Not well visualized +---------+---------------+---------+-----------+----------+-------------------+    Summary: RIGHT: - No evidence of common femoral vein obstruction.   LEFT: - There is no evidence of deep vein thrombosis in the lower extremity.  - No cystic structure found in the popliteal fossa.  *See table(s) above for measurements and  observations. Electronically signed by Sherald Hess MD on 09/03/2023 at 6:53:55 PM.    Final    DG Chest 2 View Result Date: 09/03/2023 CLINICAL DATA:  Chest pain EXAM: CHEST - 2 VIEW COMPARISON:  03/02/2023 FINDINGS: Mild cardiomegaly status post sternotomy and CABG. Aortic atherosclerosis. No focal airspace consolidation, pleural effusion, or pneumothorax. IMPRESSION: No active cardiopulmonary disease. Electronically Signed   By: Duanne Guess D.O.   On: 09/03/2023 17:48    Assessment and Plan  Chest pain Likely related to her anemia.  EKG does show ST depressions in lateral leads but troponin negative x 2 and not consistent with ACS.  Chest x-ray showing no active cardiopulmonary disease.  She does have history of prior PE many years ago but reports compliance with Eliquis.  She is not tachycardic, tachypneic, dyspneic, or hypoxic.  Satting 100% on room air.  Chest pain does not appear to be pleuritic in nature.  Patient had a fall a week ago at a doctor's office and had bumped her left lower lateral leg against an object which resulted in some mild swelling and erythema in that area.  Doppler ultrasound of the left lower extremity is negative for DVT.  Recurrent PE less likely, will check D-dimer.  Continue management of anemia as mentioned below.  Acute on chronic symptomatic microcytic anemia Patient was admitted to the hospital in April 2024 for anemia with hemoglobin down to 6 and required blood transfusions.  No source of GI bleed was found at that time.  On labs today, her hemoglobin is down to 5.8 and MCV 59.8.  Baseline hemoglobin appears to be in the 8-9 range.  She is on Eliquis but FOBT negative.  Patient denies any symptoms of GI bleed or any other obvious bleeding.  Type and screen, 1 unit PRBCs ordered in the ED.  Will order additional 1 unit PRBCs.  Follow-up posttransfusion H&H and continue to transfuse PRBCs if hemoglobin is less than 8 given CAD.  Check iron, ferritin, and  TIBC.  Will hold Eliquis at this time and check D-dimer as mentioned previously.  If D-dimer positive, then start IV heparin for anticoagulation and obtain VQ scan in the morning to rule out PE.  Best to avoid IV contrast given CKD/low GFR.  Chronic HFpEF Last echo done in July 2024 showing EF 55 to 60%, mild MVR, mild AVR, moderate AVS. No signs of volume overload at this time.  Paroxysmal A-fib/flutter Currently in sinus rhythm.  Hold Eliquis at this time and check D-dimer as mentioned previously.  If D-dimer positive, then start IV heparin for anticoagulation and obtain VQ scan in the morning to rule out PE.  Hypertension Hold antihypertensives at this time as there was a slight drop in her blood pressure at cardiology office when going from sitting to standing position.  Repeat orthostatics in the morning.  Ulcerative colitis Stable, patient is not endorsing any symptoms  at this time.  OSA Continue nightly CPAP.  CKD stage IIIb Creatinine stable.  Generalized weakness/recent fall In the setting of anemia.  Patient denies head injury or loss of consciousness related to the fall.  PT/OT eval, fall precautions.  CAD status post CABG and multiple PCIs with stents: Workup not suggestive of ACS. PAD status post iliac bypass History of TIA/CVA Hyperlipidemia Depression Fibromyalgia/chronic pain syndrome GERD Pharmacy med rec pending.  Code Status: DNR (discussed with the patient) Family Communication: No family available at this time. Level of care: Telemetry bed Admission status: It is my clinical opinion that referral for OBSERVATION is reasonable and necessary in this patient based on the above information provided. The aforementioned taken together are felt to place the patient at high risk for further clinical deterioration. However, it is anticipated that the patient may be medically stable for discharge from the hospital within 24 to 48 hours.  John Giovanni MD [facility]  If 7PM-7AM, please contact night-coverage [email or web address]  09/03/2023, 8:20 PM

## 2023-09-03 NOTE — Progress Notes (Deleted)
 Office Visit Note  Patient: Kara Bush             Date of Birth: [birth year]           MRN: [id number]             PCP: Jackelyn Poling, DO Referring: Jackelyn Poling, DO Visit Date: 09/17/2023 Occupation: @GUAROCC @  Subjective:  No chief complaint on file.   History of Present Illness: Kara Bush is a 83 y.o. female ***     Activities of Daily Living:  Patient reports morning stiffness for *** {minute/hour:19697}.   Patient {ACTIONS;DENIES/REPORTS:21021675::"Denies"} nocturnal pain.  Difficulty dressing/grooming: {ACTIONS;DENIES/REPORTS:21021675::"Denies"} Difficulty climbing stairs: {ACTIONS;DENIES/REPORTS:21021675::"Denies"} Difficulty getting out of chair: {ACTIONS;DENIES/REPORTS:21021675::"Denies"} Difficulty using hands for taps, buttons, cutlery, and/or writing: {ACTIONS;DENIES/REPORTS:21021675::"Denies"}  No Rheumatology ROS completed.   PMFS History:  Patient Active Problem List   Diagnosis Date Noted   Acute on chronic systolic CHF (congestive heart failure) (HCC) 03/03/2023   Acute on chronic diastolic congestive heart failure (HCC) 03/02/2023   Pulmonary embolism with infarction (HCC) 03/02/2023   Chronic heart failure with preserved ejection fraction (HFpEF) (HCC) 02/11/2023   Hypervolemia 02/11/2023   Acute encephalopathy 02/05/2023   Elevated troponin 02/05/2023   Hypokalemia 02/05/2023   Hypomagnesemia 02/05/2023   Intractable nausea and vomiting 02/05/2023   Metabolic alkalosis 02/05/2023   Demand ischemia (HCC) 11/24/2022   Chest pain 11/23/2022   Non-ST elevation (NSTEMI) myocardial infarction (HCC) 11/23/2022   Acute gastrointestinal bleeding 11/23/2022   Chronic combined systolic (congestive) and diastolic (congestive) heart failure (HCC) 11/23/2022   CKD (chronic kidney disease) stage 3, GFR 30-59 ml/min (HCC) 11/23/2022   DNR (do not resuscitate) 11/23/2022   Anxiety disorder 09/25/2021   Paroxysmal atrial fibrillation (HCC)  02/26/2021   Atrial fibrillation (HCC) 09/09/2018   DOE (dyspnea on exertion) 08/10/2017   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   Greater trochanteric bursitis of right hip 11/07/2014   Sacro-iliac pain 11/07/2014   Chronic midline low back pain without sciatica 11/07/2013   Hepatitis 04/20/2013   Screening for STD (sexually transmitted disease) 04/20/2013   Recurrent UTI 04/20/2013   Abdominal aortic aneurysm (HCC)    Ulcerative colitis (HCC)    Obstructive sleep apnea    Fibromyalgia    Coronary artery disease    Basal cell carcinoma    Symptomatic anemia 01/11/2013   Ascending aortic aneurysm, 4.1 cm 01/11/2013   Cervical spondylolysis 10/15/2011   Chronic periscapular pain 10/15/2011   Chest wall pain    S/P resection of aortic aneurysm, 1998 prior to CABG    PAD (peripheral artery disease), aortic-iliac bypass 1991, mild carotid bruits    OSA (obstructive sleep apnea)    Chronic fatigue syndrome with fibromyalgia    Hyperlipidemia    Ulcerative colitis (HCC)    Depression    CAD (coronary artery disease), CABG 1998, STENTS MULTIPLE SITES SINCE.     Accelerated hypertension     Past Medical History:  Diagnosis Date   Abdominal aortic aneurysm (HCC)    Anemia, improved 01/11/2013   Basal cell carcinoma    CAD (coronary artery disease)    hx CABG 1998, last cath 2007 with PCI and stenting to the proximal segment of the vein graft to the diagonal branch. DES Taxus was placed   Cervicalgia    Chronic fatigue fibromyalgia syndrome    Chronic pain syndrome    Depression    Fibromyalgia    History of nuclear stress test 08/07/2011   lexiscan; no  evidence of ischemia or infarct; low risk    Hyperlipidemia    Hypertension    OSA (obstructive sleep apnea)    PAD (peripheral artery disease) (HCC)    Pes anserine bursitis    Pulmonary embolism (HCC) 01/11/2013   S/P resection of aortic aneurysm    Trochanteric bursitis    Ulcerative colitis (HCC)      Family History  Problem Relation Age of Onset   Heart failure Mother        at 44   Heart disease Father    Heart attack Father        @ 83   Healthy Sister    Cancer Brother        pancreatic    Cancer Paternal Aunt        BREAST   Heart failure Maternal Grandmother    Heart attack Maternal Grandfather    Heart attack Paternal Grandmother        at 40   Healthy Daughter    Healthy Daughter    Past Surgical History:  Procedure Laterality Date   AORTOBIEXTERNAL ILIAC BYPASS  1991   BIOPSY  11/25/2022   Procedure: BIOPSY;  Surgeon: Jeani Hawking, MD;  Location[facility] END[facility];  Service: Gastroenterology;;   CATARACT EXTRACTION Left 09/2019   CHOLECYSTECTOMY  1997   COLONOSCOPY WITH PROPOFOL N/A 11/25/2022   Procedure: COLONOSCOPY WITH PROPOFOL;  Surgeon: Jeani Hawking, MD;  Location: [facility] ENDOSCOPY;  Service: Gastroenterology;  Laterality: N/A;   CORONARY ANGIOPLASTY WITH STENT PLACEMENT  2006   to LAD, ATRETIC LIMA AT THAT TIME ALSO   CORONARY ANGIOPLASTY WITH STENT PLACEMENT  2007   STENT TO PROX VG-DIAG, OTHER STENTS PATENT   CORONARY ARTERY BYPASS GRAFT  1998   LIMA-LAD;VG-DIAG & RCA   CORONARY STENT PLACEMENT  2005   TO LAD & LCX-STENTS,   ESOPHAGOGASTRODUODENOSCOPY (EGD) WITH PROPOFOL N/A 11/25/2022   Procedure: ESOPHAGOGASTRODUODENOSCOPY (EGD) WITH PROPOFOL;  Surgeon: Jeani Hawking, MD;  Location: [facility] ENDOSCOPY;  Service: Gastroenterology;  Laterality: N/A;   HERNIA REPAIR     TUBAL LIGATION     BTL   Social History   Social History Narrative   Not on file   Immunization History  Administered Date(s) Administered   Influenza,inj,Quad PF,6+ Mos 04/20/2013, 06/01/2014   PFIZER(Purple Top)SARS-COV-2 Vaccination 08/18/2019, 09/11/2019, 04/27/2020     Objective: Vital Signs: There were no vitals taken for this visit.   Physical Exam   Musculoskeletal Exam: ***  CDAI Exam: CDAI Score: -- Patient Global: --; Provider Global: -- Swollen: --; Tender: -- Joint  Exam 09/17/2023   No joint exam has been documented for this visit   There is currently no information documented on the homunculus. Go to the Rheumatology activity and complete the homunculus joint exam.  Investigation: No additional findings.  Imaging: No results found.  Recent Labs: Lab Results  Component Value Date   WBC 9.4 03/07/2023   HGB 8.3 (L) 03/07/2023   PLT 211 03/07/2023   NA 137 03/07/2023   K 3.8 03/07/2023   CL 98 03/07/2023   CO2 32 03/07/2023   GLUCOSE 97 03/07/2023   BUN 23 03/07/2023   CREATININE 1.45 (H) 03/07/2023   BILITOT 0.7 03/03/2023   ALKPHOS 48 03/03/2023   AST 19 03/03/2023   ALT 13 03/03/2023   PROT 6.2 (L) 03/03/2023   ALBUMIN 3.2 (L) 03/03/2023   CALCIUM 8.1 (L) 03/07/2023   GFRAA 73 10/13/2019    Speciality Comments: No specialty comments available.  Procedures:  No procedures performed Allergies: Biotin, Lisinopril, Atenolol, Duloxetine hcl, Lyrica [pregabalin], Penicillin g, Penicillins, and Sulfamethoxazole-trimethoprim   Assessment / Plan:     Visit Diagnoses: Chronic right-sided low back pain with right-sided sciatica  Chronic right shoulder pain  Lateral epicondylitis, right elbow  Primary osteoarthritis of both hands  Trochanteric bursitis of both hips  Cervical spondylolysis  Sacro-iliac pain  Fibromyalgia  Primary insomnia  History of claudication  Coronary artery disease status post CABG  History of depression  History of hypertension  Paroxysmal atrial fibrillation (HCC)  History of ulcerative colitis  History of hyperlipidemia  OSA (obstructive sleep apnea)  Orders: No orders of the defined types were placed in this encounter.  No orders of the defined types were placed in this encounter.   Face-to-face time spent with patient was *** minutes. Greater than 50% of time was spent in counseling and coordination of care.  Follow-Up Instructions: No follow-ups on file.   Gearldine Bienenstock,  PA-C  Note - This record has been created using Dragon software.  Chart creation errors have been sought, but may not always  have been located. Such creation errors do not reflect on  the standard of medical care.

## 2023-09-03 NOTE — ED Triage Notes (Signed)
Pt reports constant chest pain x 1 week, states she has not felt well associated with lack of energy. She also verbalizes concern for DVT in her left leg because she has a small red area on her calf. She takes Eliquis.

## 2023-09-03 NOTE — Discharge Instructions (Addendum)
You were seen in the emergency n

## 2023-09-03 NOTE — Progress Notes (Signed)
LLE venous duplex has been completed.  Preliminary results given to Dr. Dalene Seltzer.   Results can be found under chart review under CV PROC. 09/03/2023 5:46 PM Kenidee Cregan RVT, RDMS

## 2023-09-03 NOTE — ED Provider Triage Note (Addendum)
Emergency Medicine Provider Triage Evaluation Note  Kara Bush , a 83 y.o. female  was evaluated in triage.  Pt complains of 4-5 intermittent chest pain that is worsened with small occasional coughs. CP described as "squeezing".   One week ago, fell onto ground after "legs gave out" at doc office. States concerned for clot as there is a red spot with mild swelling to right calf that is TTP. Takes eliquis for a fib. Has been able to walk and bear weight on extremity following fall  Was evaluated by cardiologist today and recommended to seek ed evaluation due to EKG changes  Currently no complaints of cp  Review of Systems  Positive: Cp, right calf swelling and pain. Negative: Sob, fevers  Physical Exam  BP (!) 116/41 (BP Location: Right Arm)   Pulse 70   Temp 98.1 F (36.7 C) (Oral)   Resp 17   Ht 5' 3.5" (1.613 m)   Wt 74.8 kg   SpO2 100%   BMI 28.77 kg/m  Gen:   Awake, no distress   Resp:  Normal effort  MSK:   Moves extremities without difficulty  Other:  red spot with mild swelling to anterior right calf that is TTP. TTP of left anterior chest  Medical Decision Making  Medically screening exam initiated at 4:35 PM.  Appropriate orders placed.  Kara Bush was informed that the remainder of the evaluation will be completed by another provider, this initial triage assessment does not replace that evaluation, and the importance of remaining in the ED until their evaluation is complete.  Workup started   Judithann Sheen, PA 09/03/23 1644    Judithann Sheen, PA 09/03/23 [phone]

## 2023-09-04 DIAGNOSIS — D649 Anemia, unspecified: Secondary | ICD-10-CM | POA: Diagnosis not present

## 2023-09-04 DIAGNOSIS — R072 Precordial pain: Secondary | ICD-10-CM | POA: Diagnosis not present

## 2023-09-04 LAB — HEMOGLOBIN AND HEMATOCRIT, BLOOD
HCT: 28.6 % — ABNORMAL LOW (ref 36.0–46.0)
Hemoglobin: 8.6 g/dL — ABNORMAL LOW (ref 12.0–15.0)

## 2023-09-04 LAB — PREPARE RBC (CROSSMATCH)

## 2023-09-04 MED ORDER — ISOSORBIDE MONONITRATE ER 30 MG PO TB24: 30.0000 mg | ORAL_TABLET | Freq: Every day | ORAL | Status: DC

## 2023-09-04 MED ORDER — POTASSIUM CHLORIDE CRYS ER 10 MEQ PO TBCR: 10.0000 meq | EXTENDED_RELEASE_TABLET | ORAL | Status: DC

## 2023-09-04 MED ORDER — SACUBITRIL-VALSARTAN 24-26 MG PO TABS: 1.0000 | ORAL_TABLET | Freq: Two times a day (BID) | ORAL | Status: DC

## 2023-09-04 MED ORDER — METHOCARBAMOL 500 MG PO TABS: 500.0000 mg | ORAL_TABLET | Freq: Three times a day (TID) | ORAL | Status: DC | PRN

## 2023-09-04 MED ORDER — IRON (FERROUS SULFATE) 325 (65 FE) MG PO TABS: 325.0000 mg | ORAL_TABLET | Freq: Two times a day (BID) | ORAL | 2 refills | Status: AC

## 2023-09-04 MED ORDER — TRAZODONE HCL 50 MG PO TABS: 50.0000 mg | ORAL_TABLET | Freq: Every evening | ORAL | Status: DC | PRN

## 2023-09-04 MED ORDER — APIXABAN 5 MG PO TABS: 5.0000 mg | ORAL_TABLET | Freq: Two times a day (BID) | ORAL | Status: DC

## 2023-09-04 MED ORDER — SPIRONOLACTONE 12.5 MG HALF TABLET: 12.5000 mg | ORAL_TABLET | Freq: Every day | ORAL | Status: DC

## 2023-09-04 MED ORDER — HYDROCODONE-ACETAMINOPHEN 5-325 MG PO TABS
1.0000 | ORAL_TABLET | Freq: Four times a day (QID) | ORAL | Status: DC | PRN
  Administered 2023-09-04 (×2): 1 via ORAL
  Filled 2023-09-04 (×2): qty 1

## 2023-09-04 MED ORDER — CITALOPRAM HYDROBROMIDE 10 MG PO TABS: 20.0000 mg | ORAL_TABLET | Freq: Every day | ORAL | Status: DC

## 2023-09-04 MED ORDER — FUROSEMIDE 20 MG PO TABS: 40.0000 mg | ORAL_TABLET | ORAL | Status: DC

## 2023-09-04 MED ORDER — ATORVASTATIN CALCIUM 40 MG PO TABS: 40.0000 mg | ORAL_TABLET | Freq: Every day | ORAL | Status: DC

## 2023-09-04 MED ORDER — METOPROLOL SUCCINATE ER 25 MG PO TB24: 75.0000 mg | ORAL_TABLET | Freq: Every day | ORAL | Status: DC

## 2023-09-04 NOTE — Discharge Summary (Signed)
Physician Discharge Summary  Kara Bush EAV:[id number] DOB: 10-20-1940 DOA: 09/03/2023  PCP: Jackelyn Poling, DO  Admit date: 09/03/2023 Discharge date: 09/04/2023  Admitted From: home via cardiology clinic  Disposition: Home  Recommendations for Outpatient Follow-up:  Follow up with PCP in 1-2 weeks Please obtain BMP/CBC in one week Will make referral to hematology clinic for iron infusions. Follow-up with cardiology as scheduled  Home Health: N/A Equipment/Devices: N/A  Discharge Condition: Stable CODE STATUS: DNR Diet recommendation: Low-salt diet  Discharge summary: 83 year old with history of coronary artery disease status post CABG x 3, multiple PCI's, chronic diastolic heart failure, thoracic aortic aneurysm, peripheral artery disease, history of stroke, PE, paroxysmal A-fib on Eliquis, hypertension, chronic pain syndrome who was seen at cardiology office on 1/30 for generalized weakness and left-sided chest pain.  EKG done in the office was concerning for ST depressions on the lateral leads slight worse than before.  Blood pressure was low 104/50.  She was sent to the emergency room.  In the emergency room otherwise stable.  Hemoglobin 5.8, FOBT negative.  Serial troponins nonischemic.  Chest x-ray negative.  Dopplers leg negative.  She was treated for following conditions.  Chest pain: Mostly atypical.  Serial troponins and EKG nonischemic.  Discussed with cardiology and they recommended outpatient follow-up.  Chest pain is improved with hydrocodone use.  Anemia: Found to have chronic microcytic anemia.  B12 was normal.Iron was 17, ferritin 2. 5.8 on presentation-2 units PRBC transfusion with appropriate response.  Hemoglobin 8.6. FOBT negative.  No evidence of active bleeding. Previous EGD and colonoscopy with benign findings. With clinical improvement, will discharge patient with oral iron supplementation.  She will benefit with periodic iron transfusions, will send  referral to hematology clinic.  Essential hypertension Paroxysmal A-fib Remains stable.  She is able to resume all her home medications.  Stable for discharge.   Discharge Diagnoses:  Principal Problem:   Chest pain Active Problems:   OSA (obstructive sleep apnea)   Hyperlipidemia   Ulcerative colitis (HCC)   Depression   Symptomatic anemia   Coronary artery disease   Paroxysmal atrial fibrillation (HCC)   CKD (chronic kidney disease) stage 3, GFR 30-59 ml/min (HCC)   Chronic heart failure with preserved ejection fraction (HFpEF) [facility])    Discharge Instructions  Discharge Instructions     Ambulatory referral to Hematology / Oncology   Complete by: As directed    Diet - low sodium heart healthy   Complete by: As directed    Increase activity slowly   Complete by: As directed       Allergies as of 09/04/2023       Reactions   Biotin Hives   Lisinopril Cough   Atenolol Other (See Comments)   colitis Other reaction(s): Bowel issues   Duloxetine Hcl Other (See Comments)   Mad pt feel spacey Other reaction(s): shaky; tired in the PMs   Lyrica [pregabalin] Other (See Comments)   Made pt feel spacey   Penicillin G Rash   Other reaction(s): rash   Penicillins Rash   Sulfamethoxazole-trimethoprim Nausea And Vomiting   Other reaction(s): elevated liver functions        Medication List     STOP taking these medications    ibuprofen 400 MG tablet Commonly known as: ADVIL       TAKE these medications    acetaminophen 500 MG tablet Commonly known as: TYLENOL Take 500 mg by mouth as needed for moderate pain (pain score 4-6).   apixaban  5 MG Tabs tablet Commonly known as: ELIQUIS Take 1 tablet (5 mg total) by mouth 2 (two) times daily.   atorvastatin 40 MG tablet Commonly known as: LIPITOR Take 1 tablet (40 mg total) by mouth daily.   Vitamin B Complex Tabs 100 mg 1 tablet by mouth once a day   b complex vitamins capsule Take 1 capsule by mouth  daily.   Boostrix 5-2.5-18.5 LF-MCG/0.5 injection Generic drug: Tdap   citalopram 20 MG tablet Commonly known as: CELEXA Take 1 tablet (20 mg total) by mouth daily.   Entresto 24-26 MG Generic drug: sacubitril-valsartan 1 tablet Oral Twice a day   furosemide 40 MG tablet Commonly known as: LASIX Take 1 tablet (40 mg total) by mouth every Monday, Wednesday, and Friday.   HYDROcodone-acetaminophen 5-325 MG tablet Commonly known as: NORCO/VICODIN Take 1 tablet by mouth 4 (four) times daily as needed for moderate pain (pain score 4-6). May Take an extra 0.5 to one tablet when pain is severe.   Iron (Ferrous Sulfate) 325 (65 Fe) MG Tabs Take 325 mg by mouth 2 (two) times daily.   isosorbide mononitrate 30 MG 24 hr tablet Commonly known as: IMDUR Take 1 tablet (30 mg total) by mouth daily.   methocarbamol 500 MG tablet Commonly known as: ROBAXIN 1 capsule orally every 8 hrs as needed   metoprolol succinate 25 MG 24 hr tablet Commonly known as: TOPROL-XL Take 3 tablets (75 mg total) by mouth daily. Take with or immediately following a meal.   MULTIPLE VITAMIN PO   multivitamin with minerals Tabs tablet Take 1 tablet by mouth daily.   pantoprazole 40 MG tablet Commonly known as: PROTONIX Take 1 tablet (40 mg total) by mouth 2 (two) times daily.   potassium chloride 10 MEQ tablet Commonly known as: KLOR-CON M Take 1 tablet (10 mEq total) by mouth every Monday, Wednesday, and Friday.   Shingrix injection Generic drug: Zoster Vaccine Adjuvanted   spironolactone 25 MG tablet Commonly known as: ALDACTONE Take 12.5 mg by mouth daily.   traZODone 50 MG tablet Commonly known as: DESYREL Take 1 tablet (50 mg total) by mouth at bedtime as needed for sleep.   Tums 500 MG chewable tablet Generic drug: calcium carbonate 1 tablet Orally Once a day As needed        Follow-up Information     Jackelyn Poling, DO In 2 days.   Specialty: Family Medicine Contact  information: [address] [place] [address] [phone]         ENT On 09/09/2023.                 Allergies  Allergen Reactions   Biotin Hives   Lisinopril Cough   Atenolol Other (See Comments)    colitis Other reaction(s): Bowel issues   Duloxetine Hcl Other (See Comments)    Mad pt feel spacey Other reaction(s): shaky; tired in the PMs   Lyrica [Pregabalin] Other (See Comments)    Made pt feel spacey   Penicillin G Rash    Other reaction(s): rash   Penicillins Rash   Sulfamethoxazole-Trimethoprim Nausea And Vomiting    Other reaction(s): elevated liver functions    Consultations: Cardiology, curbside   Procedures/Studies: VAS [place] LOWER EXTREMITY VENOUS (DVT) (ONLY [facility] & [facility]) Result Date: 09/03/2023  Lower Venous DVT Study Patient Name:  Kara Bush  Date of Exam:   09/03/2023 Medical Rec #: [id number]           Accession #:  [id number] Date of Birth: 1941-02-10          Patient Gender: F Patient Age:   2 years Exam Location:  [facility] Procedure:      VAS [place] LOWER EXTREMITY VENOUS (DVT) Referring Phys: Sabra Heck --------------------------------------------------------------------------------  Indications: Lateral ankle pain post fall.  Anticoagulation: Eliquis. Comparison Study: Previous exam on 09/23/2012 was negative for DVT. Performing Technologist: Ernestene Mention RVT, RDMS  Examination Guidelines: A complete evaluation includes B-mode imaging, spectral Doppler, color Doppler, and power Doppler as needed of all accessible portions of each vessel. Bilateral testing is considered an integral part of a complete examination. Limited examinations for reoccurring indications may be performed as noted. The reflux portion of the exam is performed with the patient in reverse Trendelenburg.  +-----+---------------+---------+-----------+----------+--------------+ RIGHTCompressibilityPhasicitySpontaneityPropertiesThrombus Aging  +-----+---------------+---------+-----------+----------+--------------+ CFV  Full           Yes      Yes                                 +-----+---------------+---------+-----------+----------+--------------+   +---------+---------------+---------+-----------+----------+-------------------+ LEFT     CompressibilityPhasicitySpontaneityPropertiesThrombus Aging      +---------+---------------+---------+-----------+----------+-------------------+ CFV      Full           Yes      Yes                                      +---------+---------------+---------+-----------+----------+-------------------+ SFJ      Full                                                             +---------+---------------+---------+-----------+----------+-------------------+ FV Prox  Full           Yes      Yes                                      +---------+---------------+---------+-----------+----------+-------------------+ FV Mid   Full           Yes      Yes                                      +---------+---------------+---------+-----------+----------+-------------------+ FV DistalFull           Yes      Yes                                      +---------+---------------+---------+-----------+----------+-------------------+ PFV      Full                                                             +---------+---------------+---------+-----------+----------+-------------------+ POP      Full           Yes  Yes                                      +---------+---------------+---------+-----------+----------+-------------------+ PTV      Full                                                             +---------+---------------+---------+-----------+----------+-------------------+ PERO                                                  Not well visualized +---------+---------------+---------+-----------+----------+-------------------+    Summary: RIGHT: - No  evidence of common femoral vein obstruction.   LEFT: - There is no evidence of deep vein thrombosis in the lower extremity.  - No cystic structure found in the popliteal fossa.  *See table(s) above for measurements and observations. Electronically signed by Sherald Hess MD on 09/03/2023 at 6:53:55 PM.    Final    DG Chest 2 View Result Date: 09/03/2023 CLINICAL DATA:  Chest pain EXAM: CHEST - 2 VIEW COMPARISON:  03/02/2023 FINDINGS: Mild cardiomegaly status post sternotomy and CABG. Aortic atherosclerosis. No focal airspace consolidation, pleural effusion, or pneumothorax. IMPRESSION: No active cardiopulmonary disease. Electronically Signed   By: Duanne Guess D.O.   On: 09/03/2023 17:48   (Echo, Carotid, EGD, Colonoscopy, ERCP)    Subjective: Patient seen and examined.  Still in the emergency room.  Patient has received 2 units PRBC overnight uneventful.  Eager to go home.  She had some left-sided chest pain that was mostly in the left shoulder and felt better with hydrocodone.   Discharge Exam: Vitals:   09/04/23 1203 09/04/23 1204  BP:    Pulse: (!) 105 98  Resp: (!) 26 18  Temp:  (!) 97.5 F (36.4 C)  SpO2: 100% 100%   Vitals:   09/04/23 1100 09/04/23 1202 09/04/23 1203 09/04/23 1204  BP: (!) 155/55 (!) 156/66    Pulse: 96  (!) 105 98  Resp: 12  (!) 26 18  Temp:    (!) 97.5 F (36.4 C)  TempSrc:    Oral  SpO2: 100%  100% 100%  Weight:      Height:        General: Pt is alert, awake, not in acute distress Cardiovascular: RRR, S1/S2 +, no rubs, no gallops Respiratory: CTA bilaterally, no wheezing, no rhonchi Abdominal: Soft, NT, ND, bowel sounds + Extremities: no edema, no cyanosis    The results of significant diagnostics from this hospitalization (including imaging, microbiology, ancillary and laboratory) are listed below for reference.     Microbiology: No results found for this or any previous visit (from the past 240 hours).   Labs: BNP (last 3  results) Recent Labs    02/10/23 1321 03/02/23 1117  BNP 739.7* 671.2*   Basic Metabolic Panel: Recent Labs  Lab 09/03/23 1628  NA 138  K 4.3  CL 103  CO2 24  GLUCOSE 113*  BUN 23  CREATININE 1.57*  CALCIUM 9.0   Liver Function Tests: No results for input(s): "AST", "ALT", "ALKPHOS", "BILITOT", "PROT", "ALBUMIN" in the last 168 hours.  No results for input(s): "LIPASE", "AMYLASE" in the last 168 hours. No results for input(s): "AMMONIA" in the last 168 hours. CBC: Recent Labs  Lab 09/03/23 1628 09/04/23 1107  WBC 8.7  --   HGB 5.8* 8.6*  HCT 21.7* 28.6*  MCV 59.8*  --   PLT 186  --    Cardiac Enzymes: No results for input(s): "CKTOTAL", "CKMB", "CKMBINDEX", "TROPONINI" in the last 168 hours. BNP: Invalid input(s): "POCBNP" CBG: Recent Labs  Lab 09/03/23 2047  GLUCAP 90   D-Dimer Recent Labs    09/03/23 2252  DDIMER 0.40   Hgb A1c No results for input(s): "HGBA1C" in the last 72 hours. Lipid Profile No results for input(s): "CHOL", "HDL", "LDLCALC", "TRIG", "CHOLHDL", "LDLDIRECT" in the last 72 hours. Thyroid function studies No results for input(s): "TSH", "T4TOTAL", "T3FREE", "THYROIDAB" in the last 72 hours.  Invalid input(s): "FREET3" Anemia work up Recent Labs    09/03/23 2252  FERRITIN 2*  TIBC 489*  IRON 17*  RETICCTPCT 1.1   Urinalysis    Component Value Date/Time   COLORURINE STRAW (A) 02/13/2023 0835   APPEARANCEUR CLEAR 02/13/2023 0835   LABSPEC 1.009 02/13/2023 0835   PHURINE 5.0 02/13/2023 0835   GLUCOSEU 50 (A) 02/13/2023 0835   HGBUR NEGATIVE 02/13/2023 0835   BILIRUBINUR NEGATIVE 02/13/2023 0835   KETONESUR NEGATIVE 02/13/2023 0835   PROTEINUR NEGATIVE 02/13/2023 0835   NITRITE NEGATIVE 02/13/2023 0835   LEUKOCYTESUR MODERATE (A) 02/13/2023 0835   Sepsis Labs Recent Labs  Lab 09/03/23 1628  WBC 8.7   Microbiology No results found for this or any previous visit (from the past 240 hours).   Time coordinating  discharge: 35 minutes  SIGNED:   Dorcas Carrow, MD  [facility] 09/04/2023, 1:18 PM

## 2023-09-04 NOTE — Evaluation (Signed)
Occupational Therapy Evaluation Patient Details Name: Kara Bush MRN: [id number] DOB: 05-18-1941 Today's Date: 09/04/2023   History of Present Illness Pt is an 83 yo female presenting to [facility] ED on 09/03/23 for the evaluation of chest pain and generalized weakness. Hgb 5.8. Received one unit PRBC in ED. PMH significant of CAD status post CABG x 3 in 1998 and multiple PCIs with stents in 2005, 2006, and 2007.  Also history of chronic HFpEF, thoracic aortic aneurysm, PAD status post iliac bypass, TIA/CVA, PE, paroxysmal A-fib/flutter on Eliquis, hyperlipidemia, hypertension, ulcerative colitis, anemia, depression, fibromyalgia, chronic pain syndrome, OSA on CPAP, GERD, CKD stage IIIb   Clinical Impression   Pt presenting close to functional baseline completing ADLs independently and ambulating no AD in hall with supervision for safety. Pt BP stable, a bit elevated. She has no further acute skilled OT needs, no post acute OT needed.        If plan is discharge home, recommend the following: Other (comment) (prn)    Functional Status Assessment  Patient has not had a recent decline in their functional status  Equipment Recommendations  None recommended by OT    Recommendations for Other Services       Precautions / Restrictions Precautions Precautions: Fall Restrictions Weight Bearing Restrictions Per Provider Order: No      Mobility Bed Mobility Overal bed mobility: Independent                  Transfers Overall transfer level: Independent Equipment used: None                      Balance Overall balance assessment: No apparent balance deficits (not formally assessed)                                         ADL either performed or assessed with clinical judgement   ADL Overall ADL's : Independent                                       General ADL Comments: Pt independent in ADLs, needed Max A to put on socks but  wears slip on shoes at baseline, toilet t/f with supervision for safety.     Vision         Perception         Praxis         Pertinent Vitals/Pain Pain Assessment Pain Assessment: Faces Faces Pain Scale: Hurts a little bit Pain Location: Chest, sternal region Pain Descriptors / Indicators: Aching, Dull, Discomfort Pain Intervention(s): Monitored during session     Extremity/Trunk Assessment Upper Extremity Assessment Upper Extremity Assessment: Overall WFL for tasks assessed   Lower Extremity Assessment Lower Extremity Assessment: Overall WFL for tasks assessed       Communication Communication Communication: Hearing impairment   Cognition Arousal: Alert Behavior During Therapy: WFL for tasks assessed/performed Overall Cognitive Status: Within Functional Limits for tasks assessed                                       General Comments  Pt in constant afib throughout session. Bp stable throghout session    Exercises     Shoulder Instructions  Home Living Family/patient expects to be discharged to:: Private residence Living Arrangements: Children;Other relatives (daugher and grandson and son in Social worker) Available Help at Discharge: Family;Available PRN/intermittently Type of Home: House Home Access: Stairs to enter [employer] of Steps: 4 Entrance Stairs-Rails: Right;Left;Can reach both Home Layout: Two level;Able to live on main level with bedroom/bathroom Alternate Level Stairs-Number of Steps: Flight Alternate Level Stairs-Rails: Can reach both;Right;Left Bathroom Shower/Tub: Producer, television/film/video: Handicapped height Bathroom Accessibility: Yes   Home Equipment: Rollator (4 wheels);Cane - single point;Shower seat;Shower seat - built in;Grab bars - toilet;Hand held shower head          Prior Functioning/Environment Prior Level of Function : Independent/Modified Independent             Mobility Comments:  ind no AD ADLs Comments: Ind, gets groceries delievered from family. Getting help with cooking        OT Problem List: Pain      OT Treatment/Interventions:      OT Goals(Current goals can be found in the care plan section) Acute Rehab OT Goals Patient Stated Goal: To go home OT Goal Formulation: With patient Time For Goal Achievement: 09/18/23 Potential to Achieve Goals: Good  OT Frequency:      Co-evaluation              AM-PAC OT "6 Clicks" Daily Activity     Outcome Measure Help from another person eating meals?: None Help from another person taking care of personal grooming?: None Help from another person toileting, which includes using toliet, bedpan, or urinal?: None Help from another person bathing (including washing, rinsing, drying)?: None Help from another person to put on and taking off regular upper body clothing?: None Help from another person to put on and taking off regular lower body clothing?: None 6 Click Score: 24   End of Session Nurse Communication: Mobility status  Activity Tolerance: Patient tolerated treatment well Patient left: Other (comment) (pt left working in hall with providing PT)  OT Visit Diagnosis: Pain Pain - Right/Left:  (chest)                Time: 1610-9604 OT Time Calculation (min): 12 min Charges:  OT General Charges $OT Visit: 1 Visit OT Evaluation $OT Eval Low Complexity: 1 Low  09/04/2023  AB, OTR/L  Acute Rehabilitation Services  Office: [phone]   Tristan Schroeder 09/04/2023, 11:53 AM

## 2023-09-04 NOTE — ED Notes (Signed)
Considering pt is up for DC she has elected to take her daily meds when she gets home.

## 2023-09-04 NOTE — Evaluation (Signed)
Physical Therapy Evaluation Patient Details Name: Kara Bush MRN: [id number] DOB: [birth year] Today's Date: 09/04/2023  History of Present Illness  Pt is an 83 yo female presenting to [facility] ED on 09/03/23 for the evaluation of chest pain and generalized weakness. Hgb 5.8. Received one unit PRBC in ED. PMH significant of CAD status post CABG x 3 in 1998 and multiple PCIs with stents in 2005, 2006, and 2007.  Also history of chronic HFpEF, thoracic aortic aneurysm, PAD status post iliac bypass, TIA/CVA, PE, paroxysmal A-fib/flutter on Eliquis, hyperlipidemia, hypertension, ulcerative colitis, anemia, depression, fibromyalgia, chronic pain syndrome, OSA on CPAP, GERD, CKD stage IIIb   Clinical Impression  PT eval complete. Pt is independent bed mobility and transfers. Supervision provided for amb 160' without AD. Steady gait noted. Pt reports poor activity tolerance at baseline and good understanding of energy conservation techniques. No further skilled PT intervention indicated. Recommend mobility team for hallway amb if pt remains in hospital. PT signing off.        If plan is discharge home, recommend the following: Assistance with cooking/housework;Assist for transportation;Help with stairs or ramp for entrance   Can travel by private vehicle        Equipment Recommendations None recommended by PT  Recommendations for Other Services       Functional Status Assessment Patient has not had a recent decline in their functional status     Precautions / Restrictions Precautions Precautions: Fall      Mobility  Bed Mobility Overal bed mobility: Independent                  Transfers Overall transfer level: Independent Equipment used: None                    Ambulation/Gait Ambulation/Gait assistance: Supervision Gait Distance (Feet): 160 Feet Assistive device: None Gait Pattern/deviations: WFL(Within Functional Limits) Gait velocity: WFL Gait velocity  interpretation: 1.31 - 2.62 ft/sec, indicative of limited community ambulator   General Gait Details: steady gait without AD. Fatigues quickly (reports this is baseline).  Stairs            Wheelchair Mobility     Tilt Bed    Modified Rankin (Stroke Patients Only)       Balance Overall balance assessment: No apparent balance deficits (not formally assessed)                                           Pertinent Vitals/Pain Pain Assessment Pain Assessment: No/denies pain    Home Living Family/patient expects to be discharged to:: Private residence Living Arrangements: Children (grandson and occasional children visits) Available Help at Discharge: Family;Available PRN/intermittently Type of Home: House Home Access: Stairs to enter Entrance Stairs-Rails: Right;Left;Can reach both Entrance Stairs-Number of Steps: 4 Alternate Level Stairs-Number of Steps: Flight Home Layout: Two level;Able to live on main level with bedroom/bathroom Home Equipment: Rollator (4 wheels);Cane - single point;Shower seat;Shower seat - built in;Grab bars - toilet;Hand held shower head      Prior Function Prior Level of Function : Independent/Modified Independent             Mobility Comments: ind no AD ADLs Comments: Ind, gets groceries delievered. Getting help with cooking     Extremity/Trunk Assessment   Upper Extremity Assessment Upper Extremity Assessment: Defer to OT evaluation    Lower Extremity Assessment Lower Extremity Assessment:  Overall WFL for tasks assessed       Communication   Communication Communication: Hearing impairment  Cognition Arousal: Alert Behavior During Therapy: WFL for tasks assessed/performed Overall Cognitive Status: Within Functional Limits for tasks assessed                                          General Comments General comments (skin integrity, edema, etc.): VSS on RA    Exercises      Assessment/Plan    PT Assessment Patient does not need any further PT services  PT Problem List         PT Treatment Interventions      PT Goals (Current goals can be found in the Care Plan section)  Acute Rehab PT Goals Patient Stated Goal: home PT Goal Formulation: All assessment and education complete, DC therapy    Frequency       Co-evaluation               AM-PAC PT "6 Clicks" Mobility  Outcome Measure Help needed turning from your back to your side while in a flat bed without using bedrails?: None Help needed moving from lying on your back to sitting on the side of a flat bed without using bedrails?: None Help needed moving to and from a bed to a chair (including a wheelchair)?: None Help needed standing up from a chair using your arms (e.g., wheelchair or bedside chair)?: None Help needed to walk in hospital room?: A Little Help needed climbing 3-5 steps with a railing? : A Little 6 Click Score: 22    End of Session   Activity Tolerance: Patient tolerated treatment well Patient left: in bed;with call bell/phone within reach Nurse Communication: Mobility status PT Visit Diagnosis: Difficulty in walking, not elsewhere classified (R26.2)    Time: 1000-1016 PT Time Calculation (min) (ACUTE ONLY): 16 min   Charges:   PT Evaluation $PT Eval Low Complexity: 1 Low   PT General Charges $$ ACUTE PT VISIT: 1 Visit         Ferd Glassing., PT  Office # [phone]   Ilda Foil 09/04/2023, 11:20 AM

## 2023-09-04 NOTE — ED Notes (Signed)
Pt ambulated to independently to restroom.

## 2023-09-04 NOTE — ED Notes (Signed)
Pt has endorsed CP moving to center of chest since a little before 7a shift change. ECG performed prior to shift change.   Pt does not appear to be in distress.  Pt also hurts all over and was concerned about her daily pain med regimine.  Vicodin given and will reassess pain.

## 2023-09-05 ENCOUNTER — Other Ambulatory Visit: Payer: Self-pay | Admitting: General Practice

## 2023-09-05 LAB — TYPE AND SCREEN
ABO/RH(D): B POS
Antibody Screen: NEGATIVE
Unit division: 0
Unit division: 0

## 2023-09-05 LAB — BPAM RBC
Blood Product Expiration Date: 202502272359
Blood Product Expiration Date: 202502282359
ISSUE DATE / TIME: 202501302348
ISSUE DATE / TIME: 202501310445
Unit Type and Rh: 7300
Unit Type and Rh: 7300

## 2023-09-07 ENCOUNTER — Encounter: Payer: Self-pay | Admitting: Hematology and Oncology

## 2023-09-07 ENCOUNTER — Inpatient Hospital Stay: Payer: PPO | Attending: Hematology and Oncology | Admitting: Hematology and Oncology

## 2023-09-07 VITALS — BP 143/51 | HR 90 | Resp 18 | Ht 63.5 in | Wt 166.0 lb

## 2023-09-07 DIAGNOSIS — N1832 Chronic kidney disease, stage 3b: Secondary | ICD-10-CM

## 2023-09-07 DIAGNOSIS — D631 Anemia in chronic kidney disease: Secondary | ICD-10-CM | POA: Diagnosis not present

## 2023-09-07 DIAGNOSIS — Z79899 Other long term (current) drug therapy: Secondary | ICD-10-CM | POA: Insufficient documentation

## 2023-09-07 DIAGNOSIS — Z7901 Long term (current) use of anticoagulants: Secondary | ICD-10-CM | POA: Diagnosis not present

## 2023-09-07 DIAGNOSIS — D509 Iron deficiency anemia, unspecified: Secondary | ICD-10-CM | POA: Diagnosis present

## 2023-09-07 DIAGNOSIS — I480 Paroxysmal atrial fibrillation: Secondary | ICD-10-CM | POA: Diagnosis not present

## 2023-09-07 DIAGNOSIS — N183 Chronic kidney disease, stage 3 unspecified: Secondary | ICD-10-CM | POA: Diagnosis not present

## 2023-09-07 DIAGNOSIS — Z87891 Personal history of nicotine dependence: Secondary | ICD-10-CM | POA: Diagnosis not present

## 2023-09-07 DIAGNOSIS — D508 Other iron deficiency anemias: Secondary | ICD-10-CM

## 2023-09-07 NOTE — Telephone Encounter (Signed)
Seen in office 09/03/23

## 2023-09-07 NOTE — Progress Notes (Signed)
[facility] CONSULT NOTE  Patient Care Team: Jackelyn Poling, DO as PCP - General (Family Medicine) Rennis Golden Lisette Abu, MD as PCP - Cardiology (Cardiology) Lanier Prude, MD as PCP - Electrophysiology (Cardiology) Jethro Bolus, MD (Inactive) as Consulting Physician (Urology)  ASSESSMENT & PLAN:  Iron deficiency anemia The most likely cause of her anemia is due to chronic blood loss/malabsorption syndrome. We discussed some of the risks, benefits, and alternatives of intravenous iron infusions. The patient is symptomatic from anemia and the iron level is critically low. She tolerated oral iron supplement poorly and desires to achieved higher levels of iron faster for adequate hematopoesis. Some of the side-effects to be expected including risks of infusion reactions, phlebitis, headaches, nausea and fatigue.  The patient is willing to proceed. Patient education material was dispensed.  Goal is to keep ferritin level greater than 50 and resolution of anemia I recommend 2 doses of intravenous iron infusion I plan to see her back in the month after that for further follow-up After adequate IV iron replenishment, we can consider erythropoietin stimulating agent for anemia secondary to chronic kidney disease   CKD (chronic kidney disease) stage 3, GFR 30-59 ml/min (HCC) She has poor renal function The patient does not drink enough oral fluids I recommend increase oral fluid intake to reduce risk of dehydration If her renal function decline further, we will have to reduce the dose of her apixaban  There is a component of anemia of chronic kidney failure as well  Paroxysmal atrial fibrillation (HCC) In April 2024, it was recommended that the dose of her Eliquis to be reduced to 2.5 mg twice daily The patient has been taking 5 mg twice daily over the past year For now, with her renal function stabilized with creatinine clearance greater than 30, I will keep her at current  dose for now  Orders Placed This Encounter  Procedures   CBC with Differential (Cancer Center Only)    Standing Status:   Future    Expiration Date:   09/06/2024   Iron and Iron Binding Capacity ([facility] only)    Standing Status:   Future    Expiration Date:   09/06/2024   Ferritin    Standing Status:   Future    Expiration Date:   09/06/2024   Erythropoietin    Standing Status:   Future    Expiration Date:   09/06/2024   Vitamin B12    Standing Status:   Future    Expiration Date:   09/06/2024   Basic Metabolic Panel - Cancer Center Only    Standing Status:   Future    Expiration Date:   09/06/2024   ABO/Rh    Standing Status:   Future    Expiration Date:   09/06/2024    All questions were answered. The patient knows to call the clinic with any problems, questions or concerns.  The total time spent in the appointment was 60 minutes encounter with patients including review of chart and various tests results, discussions about plan of care and coordination of care plan  Artis Delay, MD 2/3/202512:32 PM   CHIEF COMPLAINTS/PURPOSE OF CONSULTATION:  Anemia  HISTORY OF PRESENTING ILLNESS:  Kara Bush 83 y.o. female is here because of anemia  She was found to have abnormal CBC from recent admission On September 03, 2023, hemoglobin was 5.8 with MCV of 59.8 Iron studies were abnormal She has 4% iron saturation and ferritin of 2 I have the opportunity  to review his CBC dated back to 2012 On July 31, 2011, white count was 14.1, hemoglobin 11.9 and platelet count of 276 Between 20 12-20 23, her hemoglobin has been normal, as high as 13.3 Starting on August 23, 2022, she had significant drop in her blood count with a hemoglobin of 6.2 She had extensive evaluation including EGD and colonoscopy.  Her last colonoscopy was on 11/25/2022.  Dr. Elnoria Howard perform both colonoscopy and EGD. Colonoscopy revealed - Mild (Mayo Score 1) ulcerative colitis, unchanged since the last examination.  Biopsied.A single non-bleeding colonic angiodysplastic lesion. Diverticulosis in the sigmoid colon and in the descending colon. EGD revealed  - 3 cm hiatal hernia. Gastroesophageal flap valve classified as Hill Grade IV (no fold, wide open lumen, hiatal hernia present). Normal stomach. Normal examined duodenum. In July 2024, she was admitted briefly for feeling unwell and transient acute kidney injury Since then, hemoglobin has never been normal with microcytic anemia  Last week, she has significant upper chest discomfort and excessive fatigue leading to ER visit and was found to have severe anemia Since blood transfusion, her symptoms has improved She denies shortness of breath on exertion or presyncopal episodes  She had not noticed any recent bleeding such as epistaxis, hematuria or hematochezia The patient denies over the counter NSAID ingestion. She is on chronic anticoagulation therapy on apixaban  She had no prior history or diagnosis of cancer except for basal cell carcinoma of the skin. Her age appropriate screening programs are up-to-date. She has pica with ice craving and eats a variety of diet, although in general, her diet is low in protein and meat She does not drink enough water.  In general, she drink approximately 32 ounces of liquid despite being on diuretic She never donated blood  The patient was prescribed oral iron supplements and she takes daily in the morning with breakfast since last week  MEDICAL HISTORY:  Past Medical History:  Diagnosis Date   Abdominal aortic aneurysm (HCC)    Anemia, improved 01/11/2013   Basal cell carcinoma    CAD (coronary artery disease)    hx CABG 1998, last cath 2007 with PCI and stenting to the proximal segment of the vein graft to the diagonal branch. DES Taxus was placed   Cervicalgia    Chronic fatigue fibromyalgia syndrome    Chronic pain syndrome    Depression    Fibromyalgia    History of nuclear stress test 08/07/2011    lexiscan; no evidence of ischemia or infarct; low risk    Hyperlipidemia    Hypertension    OSA (obstructive sleep apnea)    PAD (peripheral artery disease) (HCC)    Pes anserine bursitis    Pulmonary embolism (HCC) 01/11/2013   S/P resection of aortic aneurysm    Trochanteric bursitis    Ulcerative colitis (HCC)     SURGICAL HISTORY: Past Surgical History:  Procedure Laterality Date   AORTOBIEXTERNAL ILIAC BYPASS  1991   BIOPSY  11/25/2022   Procedure: BIOPSY;  Surgeon: Jeani Hawking, MD;  Location: [facility] ENDOSCOPY;  Service: Gastroenterology;;   CATARACT EXTRACTION Left 09/2019   CHOLECYSTECTOMY  1997   COLONOSCOPY WITH PROPOFOL N/A 11/25/2022   Procedure: COLONOSCOPY WITH PROPOFOL;  Surgeon: Jeani Hawking, MD;  Location: [facility] ENDOSCOPY;  Service: Gastroenterology;  Laterality: N/A;   CORONARY ANGIOPLASTY WITH STENT PLACEMENT  2006   to LAD, ATRETIC LIMA AT THAT TIME ALSO   CORONARY ANGIOPLASTY WITH STENT PLACEMENT  2007   STENT TO PROX VG-DIAG,  OTHER STENTS PATENT   CORONARY ARTERY BYPASS GRAFT  1998   LIMA-LAD;VG-DIAG & RCA   CORONARY STENT PLACEMENT  2005   TO LAD & LCX-STENTS,   ESOPHAGOGASTRODUODENOSCOPY (EGD) WITH PROPOFOL N/A 11/25/2022   Procedure: ESOPHAGOGASTRODUODENOSCOPY (EGD) WITH PROPOFOL;  Surgeon: Jeani Hawking, MD;  Location: [facility] ENDOSCOPY;  Service: Gastroenterology;  Laterality: N/A;   HERNIA REPAIR     TUBAL LIGATION     BTL    SOCIAL HISTORY: Social History   Socioeconomic History   Marital status: Divorced    Spouse name: Not on file   Number of children: 2   Years of education: college    Highest education level: Not on file  Occupational History    Employer: RETIRED  Tobacco Use   Smoking status: Former    Current packs/day: 0.00    Average packs/day: 2.5 packs/day for 20.0 years (50.0 ttl pk-yrs)    Types: Cigarettes    Start date: 06/29/1970    Quit date: 06/29/1990    Years since quitting: 33.2    Passive exposure: Never   Smokeless  tobacco: Never  Vaping Use   Vaping status: Never Used  Substance and Sexual Activity   Alcohol use: No   Drug use: No   Sexual activity: Not on file  Other Topics Concern   Not on file  Social History Narrative   Not on file   Social Drivers of Health   Financial Resource Strain: Not on file  Food Insecurity: No Food Insecurity (09/04/2023)   Hunger Vital Sign    Worried About Running Out of Food in the Last Year: Never true    Ran Out of Food in the Last Year: Never true  Transportation Needs: No Transportation Needs (09/04/2023)   PRAPARE - Administrator, Civil Service (Medical): No    Lack of Transportation (Non-Medical): No  Physical Activity: Not on file  Stress: Not on file  Social Connections: Socially Isolated (09/04/2023)   Social Connection and Isolation Panel [NHANES]    Frequency of Communication with Friends and Family: More than three times a week    Frequency of Social Gatherings with Friends and Family: Three times a week    Attends Religious Services: Never    Active Member of Clubs or Organizations: No    Attends Banker Meetings: Never    Marital Status: Divorced  Catering manager Violence: Not At Risk (09/04/2023)   Humiliation, Afraid, Rape, and Kick questionnaire    Fear of Current or Ex-Partner: No    Emotionally Abused: No    Physically Abused: No    Sexually Abused: No    FAMILY HISTORY: Family History  Problem Relation Age of Onset   Heart failure Mother        at 18   Heart disease Father    Heart attack Father        @ 21   Healthy Sister    Cancer Brother        pancreatic    Cancer Paternal Aunt        BREAST   Heart failure Maternal Grandmother    Heart attack Maternal Grandfather    Heart attack Paternal Grandmother        at 32   Healthy Daughter    Healthy Daughter     ALLERGIES:  is allergic to biotin, lisinopril, atenolol, duloxetine hcl, lyrica [pregabalin], penicillin g, penicillins, and  sulfamethoxazole-trimethoprim.  MEDICATIONS:  Current Outpatient Medications  Medication Sig Dispense  Refill   acetaminophen (TYLENOL) 500 MG tablet Take 500 mg by mouth as needed for moderate pain (pain score 4-6).     apixaban (ELIQUIS) 5 MG TABS tablet Take 1 tablet (5 mg total) by mouth 2 (two) times daily. 180 tablet 1   atorvastatin (LIPITOR) 40 MG tablet Take 1 tablet (40 mg total) by mouth daily. 90 tablet 1   B Complex Vitamins (VITAMIN B COMPLEX) TABS 100 mg 1 tablet by mouth once a day     b complex vitamins capsule Take 1 capsule by mouth daily.     BOOSTRIX 5-2.5-18.5 LF-MCG/0.5 injection      calcium carbonate (TUMS) 500 MG chewable tablet 1 tablet Orally Once a day As needed     citalopram (CELEXA) 20 MG tablet Take 1 tablet (20 mg total) by mouth daily. 30 tablet 0   furosemide (LASIX) 40 MG tablet Take 1 tablet (40 mg total) by mouth every Monday, Wednesday, and Friday. 10 tablet 0   HYDROcodone-acetaminophen (NORCO/VICODIN) 5-325 MG tablet Take 1 tablet by mouth 4 (four) times daily as needed for moderate pain (pain score 4-6). May Take an extra 0.5 to one tablet when pain is severe. 140 tablet 0   Iron, Ferrous Sulfate, 325 (65 Fe) MG TABS Take 325 mg by mouth 2 (two) times daily. 60 tablet 2   isosorbide mononitrate (IMDUR) 30 MG 24 hr tablet Take 1 tablet (30 mg total) by mouth daily. 90 tablet 1   methocarbamol (ROBAXIN) 500 MG tablet 1 capsule orally every 8 hrs as needed     metoprolol succinate (TOPROL-XL) 25 MG 24 hr tablet Take 3 tablets (75 mg total) by mouth daily. Take with or immediately following a meal. 270 tablet 1   Multiple Vitamin (MULITIVITAMIN WITH MINERALS) TABS Take 1 tablet by mouth daily.     MULTIPLE VITAMIN PO      pantoprazole (PROTONIX) 40 MG tablet Take 1 tablet (40 mg total) by mouth 2 (two) times daily. 60 tablet 1   potassium chloride (KLOR-CON M) 10 MEQ tablet Take 1 tablet (10 mEq total) by mouth every Monday, Wednesday, and Friday. 10  tablet 0   sacubitril-valsartan (ENTRESTO) 24-26 MG 1 tablet Oral Twice a day     SHINGRIX injection      spironolactone (ALDACTONE) 25 MG tablet Take 12.5 mg by mouth daily.     traZODone (DESYREL) 50 MG tablet Take 1 tablet (50 mg total) by mouth at bedtime as needed for sleep. 30 tablet 0   No current facility-administered medications for this visit.    REVIEW OF SYSTEMS:   Constitutional: Denies fevers, chills or abnormal night sweats Eyes: Denies blurriness of vision, double vision or watery eyes Ears, nose, mouth, throat, and face: Denies mucositis or sore throat Cardiovascular: Denies palpitation, chest discomfort or lower extremity swelling Gastrointestinal:  Denies nausea, heartburn or change in bowel habits Skin: Denies abnormal skin rashes Lymphatics: Denies new lymphadenopathy or easy bruising Neurological:Denies numbness, tingling or new weaknesses Behavioral/Psych: Mood is stable, no new changes  All other systems were reviewed with the patient and are negative.  PHYSICAL EXAMINATION: ECOG PERFORMANCE STATUS: 1 - Symptomatic but completely ambulatory  Vitals:   09/07/23 1158  BP: (!) 143/51  Pulse: 90  Resp: 18  SpO2: 97%   Filed Weights   09/07/23 1158  Weight: 166 lb (75.3 kg)    GENERAL:alert, no distress and comfortable SKIN: She looks pale EYES: normal, conjunctiva are pink and non-injected, sclera clear OROPHARYNX:no exudate,  no erythema and lips, buccal mucosa, and tongue normal  NECK: supple, thyroid normal size, non-tender, without nodularity LYMPH:  no palpable lymphadenopathy in the cervical, axillary or inguinal LUNGS: clear to auscultation and percussion with normal breathing effort HEART: irregular rate & rhythm and no murmurs and no lower extremity edema ABDOMEN:abdomen soft, non-tender and normal bowel sounds.  Noted well-healed surgical scar Musculoskeletal:no cyanosis of digits and no clubbing  PSYCH: alert & oriented x 3 with fluent  speech NEURO: no focal motor/sensory deficits  RADIOGRAPHIC STUDIES: I have personally reviewed the radiological images as listed and agreed with the findings in the report. VAS [place] LOWER EXTREMITY VENOUS (DVT) (ONLY [facility]) Result Date: 09/03/2023  Lower Venous DVT Study Patient Name:  NATINA WIGINTON  Date of Exam:   09/03/2023 Medical Rec #: [id number]           Accession #:    [id number] Date of Birth: [birth year]          Patient Gender: F Patient Age:   27 years Exam Location:  [facility] Procedure:      VAS [place] LOWER EXTREMITY VENOUS (DVT) Referring Phys: Sabra Heck --------------------------------------------------------------------------------  Indications: Lateral ankle pain post fall.  Anticoagulation: Eliquis. Comparison Study: Previous exam on 09/23/2012 was negative for DVT. Performing Technologist: Ernestene Mention RVT, RDMS  Examination Guidelines: A complete evaluation includes B-mode imaging, spectral Doppler, color Doppler, and power Doppler as needed of all accessible portions of each vessel. Bilateral testing is considered an integral part of a complete examination. Limited examinations for reoccurring indications may be performed as noted. The reflux portion of the exam is performed with the patient in reverse Trendelenburg.  +-----+---------------+---------+-----------+----------+--------------+ RIGHTCompressibilityPhasicitySpontaneityPropertiesThrombus Aging +-----+---------------+---------+-----------+----------+--------------+ CFV  Full           Yes      Yes                                 +-----+---------------+---------+-----------+----------+--------------+   +---------+---------------+---------+-----------+----------+-------------------+ LEFT     CompressibilityPhasicitySpontaneityPropertiesThrombus Aging      +---------+---------------+---------+-----------+----------+-------------------+ CFV      Full           Yes      Yes                                       +---------+---------------+---------+-----------+----------+-------------------+ SFJ      Full                                                             +---------+---------------+---------+-----------+----------+-------------------+ FV Prox  Full           Yes      Yes                                      +---------+---------------+---------+-----------+----------+-------------------+ FV Mid   Full           Yes      Yes                                      +---------+---------------+---------+-----------+----------+-------------------+  FV DistalFull           Yes      Yes                                      +---------+---------------+---------+-----------+----------+-------------------+ PFV      Full                                                             +---------+---------------+---------+-----------+----------+-------------------+ POP      Full           Yes      Yes                                      +---------+---------------+---------+-----------+----------+-------------------+ PTV      Full                                                             +---------+---------------+---------+-----------+----------+-------------------+ PERO                                                  Not well visualized +---------+---------------+---------+-----------+----------+-------------------+    Summary: RIGHT: - No evidence of common femoral vein obstruction.   LEFT: - There is no evidence of deep vein thrombosis in the lower extremity.  - No cystic structure found in the popliteal fossa.  *See table(s) above for measurements and observations. Electronically signed by Sherald Hess MD on 09/03/2023 at 6:53:55 PM.    Final    DG Chest 2 View Result Date: 09/03/2023 CLINICAL DATA:  Chest pain EXAM: CHEST - 2 VIEW COMPARISON:  03/02/2023 FINDINGS: Mild cardiomegaly status post sternotomy and CABG. Aortic atherosclerosis. No focal airspace  consolidation, pleural effusion, or pneumothorax. IMPRESSION: No active cardiopulmonary disease. Electronically Signed   By: Duanne Guess D.O.   On: 09/03/2023 17:48

## 2023-09-07 NOTE — Assessment & Plan Note (Signed)
She has poor renal function The patient does not drink enough oral fluids I recommend increase oral fluid intake to reduce risk of dehydration If her renal function decline further, we will have to reduce the dose of her apixaban  There is a component of anemia of chronic kidney failure as well

## 2023-09-07 NOTE — Assessment & Plan Note (Signed)
In April 2024, it was recommended that the dose of her Eliquis to be reduced to 2.5 mg twice daily The patient has been taking 5 mg twice daily over the past year For now, with her renal function stabilized with creatinine clearance greater than 30, I will keep her at current dose for now

## 2023-09-07 NOTE — Assessment & Plan Note (Signed)
The most likely cause of her anemia is due to chronic blood loss/malabsorption syndrome. We discussed some of the risks, benefits, and alternatives of intravenous iron infusions. The patient is symptomatic from anemia and the iron level is critically low. She tolerated oral iron supplement poorly and desires to achieved higher levels of iron faster for adequate hematopoesis. Some of the side-effects to be expected including risks of infusion reactions, phlebitis, headaches, nausea and fatigue.  The patient is willing to proceed. Patient education material was dispensed.  Goal is to keep ferritin level greater than 50 and resolution of anemia I recommend 2 doses of intravenous iron infusion I plan to see her back in the month after that for further follow-up After adequate IV iron replenishment, we can consider erythropoietin stimulating agent for anemia secondary to chronic kidney disease

## 2023-09-08 DIAGNOSIS — R350 Frequency of micturition: Secondary | ICD-10-CM | POA: Diagnosis not present

## 2023-09-12 ENCOUNTER — Encounter: Payer: Self-pay | Admitting: Internal Medicine

## 2023-09-17 ENCOUNTER — Ambulatory Visit: Payer: PPO | Admitting: Physician Assistant

## 2023-09-17 DIAGNOSIS — I480 Paroxysmal atrial fibrillation: Secondary | ICD-10-CM

## 2023-09-17 DIAGNOSIS — Z8719 Personal history of other diseases of the digestive system: Secondary | ICD-10-CM

## 2023-09-17 DIAGNOSIS — I2510 Atherosclerotic heart disease of native coronary artery without angina pectoris: Secondary | ICD-10-CM

## 2023-09-17 DIAGNOSIS — M7061 Trochanteric bursitis, right hip: Secondary | ICD-10-CM

## 2023-09-17 DIAGNOSIS — Z8679 Personal history of other diseases of the circulatory system: Secondary | ICD-10-CM

## 2023-09-17 DIAGNOSIS — Z8639 Personal history of other endocrine, nutritional and metabolic disease: Secondary | ICD-10-CM

## 2023-09-17 DIAGNOSIS — M533 Sacrococcygeal disorders, not elsewhere classified: Secondary | ICD-10-CM

## 2023-09-17 DIAGNOSIS — M19041 Primary osteoarthritis, right hand: Secondary | ICD-10-CM

## 2023-09-17 DIAGNOSIS — M797 Fibromyalgia: Secondary | ICD-10-CM

## 2023-09-17 DIAGNOSIS — G8929 Other chronic pain: Secondary | ICD-10-CM

## 2023-09-17 DIAGNOSIS — G4733 Obstructive sleep apnea (adult) (pediatric): Secondary | ICD-10-CM

## 2023-09-17 DIAGNOSIS — Z8659 Personal history of other mental and behavioral disorders: Secondary | ICD-10-CM

## 2023-09-17 DIAGNOSIS — M7711 Lateral epicondylitis, right elbow: Secondary | ICD-10-CM

## 2023-09-17 DIAGNOSIS — M4302 Spondylolysis, cervical region: Secondary | ICD-10-CM

## 2023-09-17 DIAGNOSIS — F5101 Primary insomnia: Secondary | ICD-10-CM

## 2023-09-21 ENCOUNTER — Inpatient Hospital Stay: Payer: PPO

## 2023-09-21 VITALS — BP 134/52 | HR 72 | Temp 97.8°F | Resp 16

## 2023-09-21 DIAGNOSIS — D509 Iron deficiency anemia, unspecified: Secondary | ICD-10-CM | POA: Diagnosis not present

## 2023-09-21 DIAGNOSIS — D508 Other iron deficiency anemias: Secondary | ICD-10-CM

## 2023-09-21 MED ORDER — SODIUM CHLORIDE 0.9 % IV SOLN: Freq: Once | INTRAVENOUS | Status: AC

## 2023-09-21 MED ORDER — FERUMOXYTOL INJECTION 510 MG/17 ML
510.0000 mg | Freq: Once | INTRAVENOUS | Status: AC
  Administered 2023-09-21: 510 mg via INTRAVENOUS
  Filled 2023-09-21: qty 510

## 2023-09-21 NOTE — Patient Instructions (Signed)

## 2023-09-24 ENCOUNTER — Other Ambulatory Visit: Payer: Self-pay

## 2023-09-24 MED ORDER — SACUBITRIL-VALSARTAN 24-26 MG PO TABS: 1.0000 | ORAL_TABLET | Freq: Two times a day (BID) | ORAL | 3 refills | Status: DC

## 2023-09-28 ENCOUNTER — Inpatient Hospital Stay: Payer: PPO

## 2023-09-28 VITALS — BP 135/55 | HR 61 | Temp 97.6°F | Resp 16

## 2023-09-28 DIAGNOSIS — H02052 Trichiasis without entropian right lower eyelid: Secondary | ICD-10-CM | POA: Diagnosis not present

## 2023-09-28 DIAGNOSIS — H524 Presbyopia: Secondary | ICD-10-CM | POA: Diagnosis not present

## 2023-09-28 DIAGNOSIS — D509 Iron deficiency anemia, unspecified: Secondary | ICD-10-CM | POA: Diagnosis not present

## 2023-09-28 DIAGNOSIS — H5203 Hypermetropia, bilateral: Secondary | ICD-10-CM | POA: Diagnosis not present

## 2023-09-28 DIAGNOSIS — D508 Other iron deficiency anemias: Secondary | ICD-10-CM

## 2023-09-28 DIAGNOSIS — H52223 Regular astigmatism, bilateral: Secondary | ICD-10-CM | POA: Diagnosis not present

## 2023-09-28 DIAGNOSIS — H3563 Retinal hemorrhage, bilateral: Secondary | ICD-10-CM | POA: Diagnosis not present

## 2023-09-28 MED ORDER — FERUMOXYTOL INJECTION 510 MG/17 ML
510.0000 mg | Freq: Once | INTRAVENOUS | Status: AC
  Administered 2023-09-28: 510 mg via INTRAVENOUS
  Filled 2023-09-28: qty 510

## 2023-09-28 MED ORDER — SODIUM CHLORIDE 0.9 % IV SOLN
INTRAVENOUS | Status: DC
Start: 2023-09-28 — End: 2023-09-28

## 2023-09-28 NOTE — Patient Instructions (Signed)

## 2023-09-28 NOTE — Progress Notes (Signed)
 Patient declined to stay for 30 min post observation period. Tolerated well. No adverse s/s noted. VSS at time of discharge. Ambulated independently to lobby for d/c to meet family.

## 2023-10-01 DIAGNOSIS — N3000 Acute cystitis without hematuria: Secondary | ICD-10-CM | POA: Diagnosis not present

## 2023-10-14 DIAGNOSIS — Z713 Dietary counseling and surveillance: Secondary | ICD-10-CM | POA: Diagnosis not present

## 2023-10-21 NOTE — Progress Notes (Signed)
 Subjective:    Patient ID: Kara Bush, female    DOB: May 09, 1941, 83 y.o.   MRN: [id number]  HPI: Kara Bush is a 83 y.o. female who returns for follow up appointment for chronic pain and medication refill. She states her pain is located in her right shoulder and lower back pain radiating into her right hip. She rates her pain 3. Her current exercise regime is walking and performing stretching exercises.  Kara Bush Morphine equivalent is 25.00 MME.   Last DS was Performed on 08/20/2023, it was consistent.     Pain Inventory Average Pain 4 Pain Right Now 3 My pain is aching and sore  In the last 24 hours, has pain interfered with the following? General activity 0 Relation with others 0 Enjoyment of life 0 What TIME of day is your pain at its worst? morning  Sleep (in general) Good with medication  Pain is worse with: walking, bending, and some activites Pain improves with: rest and medication Relief from Meds: 6  Family History  Problem Relation Age of Onset   Heart failure Mother        at 43   Heart disease Father    Heart attack Father        @ 24   Healthy Sister    Cancer Brother        pancreatic    Cancer Paternal Aunt        BREAST   Heart failure Maternal Grandmother    Heart attack Maternal Grandfather    Heart attack Paternal Grandmother        at 70   Healthy Daughter    Healthy Daughter    Social History   Socioeconomic History   Marital status: Divorced    Spouse name: Not on file   Number of children: 2   Years of education: college    Highest education level: Not on file  Occupational History    Employer: RETIRED  Tobacco Use   Smoking status: Former    Current packs/day: 0.00    Average packs/day: 2.5 packs/day for 20.0 years (50.0 ttl pk-yrs)    Types: Cigarettes    Start date: 06/29/1970    Quit date: 06/29/1990    Years since quitting: 33.3    Passive exposure: Never   Smokeless tobacco: Never  Vaping Use    Vaping status: Never Used  Substance and Sexual Activity   Alcohol use: No   Drug use: No   Sexual activity: Not on file  Other Topics Concern   Not on file  Social History Narrative   Not on file   Social Drivers of Health   Financial Resource Strain: Not on file  Food Insecurity: No Food Insecurity (09/04/2023)   Hunger Vital Sign    Worried About Running Out of Food in the Last Year: Never true    Ran Out of Food in the Last Year: Never true  Transportation Needs: No Transportation Needs (09/04/2023)   PRAPARE - Administrator, Civil Service (Medical): No    Lack of Transportation (Non-Medical): No  Physical Activity: Not on file  Stress: Not on file  Social Connections: Socially Isolated (09/04/2023)   Social Connection and Isolation Panel [NHANES]    Frequency of Communication with Friends and Family: More than three times a week    Frequency of Social Gatherings with Friends and Family: Three times a week    Attends Religious Services: Never  Active Member of Clubs or Organizations: No    Attends Banker Meetings: Never    Marital Status: Divorced   Past Surgical History:  Procedure Laterality Date   AORTOBIEXTERNAL ILIAC BYPASS  1991   BIOPSY  11/25/2022   Procedure: BIOPSY;  Surgeon: Jeani Hawking, MD;  Location: [facility] ENDOSCOPY;  Service: Gastroenterology;;   CATARACT EXTRACTION Left 09/2019   CHOLECYSTECTOMY  1997   COLONOSCOPY WITH PROPOFOL N/A 11/25/2022   Procedure: COLONOSCOPY WITH PROPOFOL;  Surgeon: Jeani Hawking, MD;  Location: [facility] ENDOSCOPY;  Service: Gastroenterology;  Laterality: N/A;   CORONARY ANGIOPLASTY WITH STENT PLACEMENT  2006   to LAD, ATRETIC LIMA AT THAT TIME ALSO   CORONARY ANGIOPLASTY WITH STENT PLACEMENT  2007   STENT TO PROX VG-DIAG, OTHER STENTS PATENT   CORONARY ARTERY BYPASS GRAFT  1998   LIMA-LAD;VG-DIAG & RCA   CORONARY STENT PLACEMENT  2005   TO LAD & LCX-STENTS,   ESOPHAGOGASTRODUODENOSCOPY (EGD) WITH PROPOFOL  N/A 11/25/2022   Procedure: ESOPHAGOGASTRODUODENOSCOPY (EGD) WITH PROPOFOL;  Surgeon: Jeani Hawking, MD;  Location: [facility] ENDOSCOPY;  Service: Gastroenterology;  Laterality: N/A;   HERNIA REPAIR     TUBAL LIGATION     BTL   Past Surgical History:  Procedure Laterality Date   AORTOBIEXTERNAL ILIAC BYPASS  1991   BIOPSY  11/25/2022   Procedure: BIOPSY;  Surgeon: Jeani Hawking, MD;  Location: [facility] ENDOSCOPY;  Service: Gastroenterology;;   CATARACT EXTRACTION Left 09/2019   CHOLECYSTECTOMY  1997   COLONOSCOPY WITH PROPOFOL N/A 11/25/2022   Procedure: COLONOSCOPY WITH PROPOFOL;  Surgeon: Jeani Hawking, MD;  Location: [facility] ENDOSCOPY;  Service: Gastroenterology;  Laterality: N/A;   CORONARY ANGIOPLASTY WITH STENT PLACEMENT  2006   to LAD, ATRETIC LIMA AT THAT TIME ALSO   CORONARY ANGIOPLASTY WITH STENT PLACEMENT  2007   STENT TO PROX VG-DIAG, OTHER STENTS PATENT   CORONARY ARTERY BYPASS GRAFT  1998   LIMA-LAD;VG-DIAG & RCA   CORONARY STENT PLACEMENT  2005   TO LAD & LCX-STENTS,   ESOPHAGOGASTRODUODENOSCOPY (EGD) WITH PROPOFOL N/A 11/25/2022   Procedure: ESOPHAGOGASTRODUODENOSCOPY (EGD) WITH PROPOFOL;  Surgeon: Jeani Hawking, MD;  Location: [facility] ENDOSCOPY;  Service: Gastroenterology;  Laterality: N/A;   HERNIA REPAIR     TUBAL LIGATION     BTL   Past Medical History:  Diagnosis Date   Abdominal aortic aneurysm (HCC)    Anemia, improved 01/11/2013   Basal cell carcinoma    CAD (coronary artery disease)    hx CABG 1998, last cath 2007 with PCI and stenting to the proximal segment of the vein graft to the diagonal branch. DES Taxus was placed   Cervicalgia    Chronic fatigue fibromyalgia syndrome    Chronic pain syndrome    Depression    Fibromyalgia    History of nuclear stress test 08/07/2011   lexiscan; no evidence of ischemia or infarct; low risk    Hyperlipidemia    Hypertension    OSA (obstructive sleep apnea)    PAD (peripheral artery disease) (HCC)    Pes anserine bursitis     Pulmonary embolism (HCC) 01/11/2013   S/P resection of aortic aneurysm    Trochanteric bursitis    Ulcerative colitis (HCC)    BP 120/74   Pulse 76   Resp 16   Ht 5\' 4"  (1.626 m)   Wt 160 lb 6.4 oz (72.8 kg)   SpO2 98%   BMI 27.53 kg/m   Opioid Risk Score:   Fall Risk Score:  `  1  Depression screen PHQ 2/9     08/20/2023    1:35 PM 04/22/2023    2:28 PM 02/25/2023    1:31 PM 10/17/2022    1:38 PM 08/18/2022    1:15 PM 06/23/2022    1:03 PM 02/19/2022    1:40 PM  Depression screen PHQ 2/9  Decreased Interest 0 0 0 0 0 1 0  Down, Depressed, Hopeless 0 0 0 0 0 1 0  PHQ - 2 Score 0 0 0 0 0 2 0    Review of Systems  Musculoskeletal:        Right shoulder Right hip Right knee       Objective:   Physical Exam Vitals and nursing note reviewed.  Constitutional:      Appearance: Normal appearance.  Cardiovascular:     Rate and Rhythm: Normal rate and regular rhythm.     Pulses: Normal pulses.     Heart sounds: Normal heart sounds.  Pulmonary:     Effort: Pulmonary effort is normal.     Breath sounds: Normal breath sounds.  Musculoskeletal:     Comments: Normal Muscle Bulk and Muscle Testing Reveals:  Upper Extremities: Right: Decreased ROM 90 Degrees and Muscle Strength 5/5 Right AC Joint Tenderness Left: Upper Extremity: Full ROM and Muscle Strength 5/5  Thoracic Paraspinal Tenderness: T-7-T-10 Mainly Left Side Lumbar Paraspinal Tenderness: L-4-L-5 Mainly Right Side Right Greater Trochanter Tenderness Lower Extremities: Full ROM and Muscle Strength 5/5 Arises from Chair Slowly Narrow Based  Gait     Skin:    General: Skin is warm and dry.  Neurological:     Mental Status: She is alert and oriented to person, place, and time.  Psychiatric:        Mood and Affect: Mood normal.        Behavior: Behavior normal.         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 10/22/2023 2. Fibromyalgia: Continue  Home Exercise Program. Continue to Monitor.10/22/2023 3. Bilateral intermittent hand numbness: Carpal tunnel syndrome versus C6 radiculopathy mild: No complaints today. Continue to Monitor. 10/22/2023 4. Chronic Midline Low Back Pain/ LBP: Refilled: Norco 5/325mg  # 140 pills--use one pill every 6 hours as needed for pain. A second prescription was e-scribe for the following month 10/22/2023 We will continue the opioid monitoring program, this consists of regular clinic visits, examinations, urine drug screen, pill counts as well as use of [place] Controlled Substance Reporting system. A 12 month History has been reviewed on the [place] Controlled Substance Reporting System on 10/22/2023. 5. Muscle Spasm: Continue current medication regimen with  Robaxin. 10/22/2023 6.Right  Greater Trochanteric Bursitis:Continue to Alternate with heat and ice therapy. Continue current medication regime. 11/14/202 7. Left Foot pain: No complaints today.Continue with HEP as Tolerated. Continue to Monitor. 10/22/2023 8. Right Lateral Epicondylitis Pain: No complaints Today. Continue HEP as Tolerated. Continue to Monitor. 10/22/2023 9. Right Shoulder Tendonitis: Continue Current Medication Regimen. Continue to Alternate Ice and Heat Therapy. 10/22/2023 10 Bilateral Chronic Knee Pain: .No complaints today. Continue HEP as Tolerated. Continue to Monitor. 10/22/2023   F/U in 2 month

## 2023-10-22 ENCOUNTER — Encounter: Payer: PPO | Admitting: Registered Nurse

## 2023-10-22 ENCOUNTER — Encounter: Attending: Registered Nurse | Admitting: Registered Nurse

## 2023-10-22 VITALS — BP 120/74 | HR 76 | Resp 16 | Ht 64.0 in | Wt 160.4 lb

## 2023-10-22 DIAGNOSIS — Z79891 Long term (current) use of opiate analgesic: Secondary | ICD-10-CM | POA: Diagnosis not present

## 2023-10-22 DIAGNOSIS — M778 Other enthesopathies, not elsewhere classified: Secondary | ICD-10-CM | POA: Insufficient documentation

## 2023-10-22 DIAGNOSIS — Z5181 Encounter for therapeutic drug level monitoring: Secondary | ICD-10-CM | POA: Insufficient documentation

## 2023-10-22 DIAGNOSIS — G894 Chronic pain syndrome: Secondary | ICD-10-CM | POA: Diagnosis not present

## 2023-10-22 DIAGNOSIS — M5416 Radiculopathy, lumbar region: Secondary | ICD-10-CM | POA: Insufficient documentation

## 2023-10-22 DIAGNOSIS — M7061 Trochanteric bursitis, right hip: Secondary | ICD-10-CM | POA: Diagnosis not present

## 2023-10-22 DIAGNOSIS — M797 Fibromyalgia: Secondary | ICD-10-CM | POA: Diagnosis not present

## 2023-10-22 MED ORDER — HYDROCODONE-ACETAMINOPHEN 5-325 MG PO TABS
1.0000 | ORAL_TABLET | Freq: Four times a day (QID) | ORAL | 0 refills | Status: DC | PRN
Start: 2023-10-22 — End: 2023-10-22

## 2023-10-22 MED ORDER — HYDROCODONE-ACETAMINOPHEN 5-325 MG PO TABS: 1.0000 | ORAL_TABLET | Freq: Four times a day (QID) | ORAL | 0 refills | Status: DC | PRN

## 2023-10-24 ENCOUNTER — Encounter: Payer: Self-pay | Admitting: Registered Nurse

## 2023-10-27 ENCOUNTER — Encounter: Payer: Self-pay | Admitting: Hematology and Oncology

## 2023-10-27 ENCOUNTER — Telehealth: Payer: Self-pay

## 2023-10-27 ENCOUNTER — Inpatient Hospital Stay: Payer: PPO | Attending: Hematology and Oncology

## 2023-10-27 ENCOUNTER — Inpatient Hospital Stay: Payer: PPO | Admitting: Hematology and Oncology

## 2023-10-27 VITALS — BP 148/53 | HR 83 | Resp 18 | Ht 64.0 in | Wt 161.8 lb

## 2023-10-27 DIAGNOSIS — D508 Other iron deficiency anemias: Secondary | ICD-10-CM

## 2023-10-27 DIAGNOSIS — I480 Paroxysmal atrial fibrillation: Secondary | ICD-10-CM | POA: Insufficient documentation

## 2023-10-27 DIAGNOSIS — Z7901 Long term (current) use of anticoagulants: Secondary | ICD-10-CM | POA: Diagnosis not present

## 2023-10-27 DIAGNOSIS — N1832 Chronic kidney disease, stage 3b: Secondary | ICD-10-CM

## 2023-10-27 LAB — CBC WITH DIFFERENTIAL (CANCER CENTER ONLY)
Abs Immature Granulocytes: 0.01 10*3/uL (ref 0.00–0.07)
Basophils Absolute: 0 10*3/uL (ref 0.0–0.1)
Basophils Relative: 1 %
Eosinophils Absolute: 0.1 10*3/uL (ref 0.0–0.5)
Eosinophils Relative: 2 %
HCT: 39.9 % (ref 36.0–46.0)
Hemoglobin: 12.3 g/dL (ref 12.0–15.0)
Immature Granulocytes: 0 %
Lymphocytes Relative: 23 %
Lymphs Abs: 1.4 10*3/uL (ref 0.7–4.0)
MCH: 24.1 pg — ABNORMAL LOW (ref 26.0–34.0)
MCHC: 30.8 g/dL (ref 30.0–36.0)
MCV: 78.2 fL — ABNORMAL LOW (ref 80.0–100.0)
Monocytes Absolute: 0.5 10*3/uL (ref 0.1–1.0)
Monocytes Relative: 7 %
Neutro Abs: 4.3 10*3/uL (ref 1.7–7.7)
Neutrophils Relative %: 67 %
Platelet Count: 206 10*3/uL (ref 150–400)
RBC: 5.1 MIL/uL (ref 3.87–5.11)
WBC Count: 6.4 10*3/uL (ref 4.0–10.5)
nRBC: 0 % (ref 0.0–0.2)

## 2023-10-27 LAB — IRON AND IRON BINDING CAPACITY (CC-WL,HP ONLY)
Iron: 106 ug/dL (ref 28–170)
Saturation Ratios: 35 % — ABNORMAL HIGH (ref 10.4–31.8)
TIBC: 302 ug/dL (ref 250–450)
UIBC: 196 ug/dL (ref 148–442)

## 2023-10-27 LAB — FERRITIN: Ferritin: 209 ng/mL (ref 11–307)

## 2023-10-27 LAB — VITAMIN B12: Vitamin B-12: 349 pg/mL (ref 180–914)

## 2023-10-27 LAB — BASIC METABOLIC PANEL - CANCER CENTER ONLY
Anion gap: 5 (ref 5–15)
BUN: 27 mg/dL — ABNORMAL HIGH (ref 8–23)
CO2: 30 mmol/L (ref 22–32)
Calcium: 9.5 mg/dL (ref 8.9–10.3)
Chloride: 107 mmol/L (ref 98–111)
Creatinine: 1.46 mg/dL — ABNORMAL HIGH (ref 0.44–1.00)
GFR, Estimated: 35 mL/min — ABNORMAL LOW (ref >60)
Glucose, Bld: 108 mg/dL — ABNORMAL HIGH (ref 70–99)
Potassium: 4 mmol/L (ref 3.5–5.1)
Sodium: 142 mmol/L (ref 135–145)

## 2023-10-27 LAB — ABO/RH: ABO/RH(D): B POS

## 2023-10-27 MED ORDER — PANTOPRAZOLE SODIUM 40 MG PO TBEC: 40.0000 mg | DELAYED_RELEASE_TABLET | Freq: Every day | ORAL | Status: AC

## 2023-10-27 NOTE — Assessment & Plan Note (Addendum)
 She will continue anticoagulation therapy as directed I recommend the patient to watch out for signs and symptoms of bleeding

## 2023-10-27 NOTE — Progress Notes (Signed)
   [facility] OFFICE PROGRESS NOTE  Jackelyn Poling, DO  ASSESSMENT & PLAN:  Assessment & Plan Other iron deficiency anemia With recent IV iron, anemia has resolved Iron studies are pending I recommend the patient to resume taking oral iron supplement half an hour before dinnertime I plan to see her again in 6 months for further follow-up She will get her labs checked with her primary care doctor within the next 2 to 3 months Paroxysmal atrial fibrillation [facility]) She will continue anticoagulation therapy as directed I recommend the patient to watch out for signs and symptoms of bleeding    Orders Placed This Encounter  Procedures   Ferritin    Standing Status:   Future    Expiration Date:   10/26/2024   Iron and Iron Binding Capacity ([facility] only)    Standing Status:   Future    Expiration Date:   10/26/2024   CBC with Differential (Cancer Center Only)    Standing Status:   Future    Expiration Date:   10/26/2024    INTERVAL HISTORY: Patient returns for recurrent anemia Symptoms of anemia includes none We reviewed CBC results  SUMMARY OF HEMATOLOGIC HISTORY: She was found to have abnormal CBC from recent admission On September 03, 2023, hemoglobin was 5.8 with MCV of 59.8 Iron studies were abnormal She has 4% iron saturation and ferritin of 2 I have the opportunity to review his CBC dated back to 2012 On July 31, 2011, white count was 14.1, hemoglobin 11.9 and platelet count of 276 Between 20 12-20 23, her hemoglobin has been normal, as high as 13.3 Starting on August 23, 2022, she had significant drop in her blood count with a hemoglobin of 6.2 She had extensive evaluation including EGD and colonoscopy.  Her last colonoscopy was on 11/25/2022.  Dr. Elnoria Howard perform both colonoscopy and EGD. Colonoscopy revealed - Mild (Mayo Score 1) ulcerative colitis, unchanged since the last examination. Biopsied.A single non-bleeding colonic angiodysplastic lesion.  Diverticulosis in the sigmoid colon and in the descending colon. EGD revealed  - 3 cm hiatal hernia. Gastroesophageal flap valve classified as Hill Grade IV (no fold, wide open lumen, hiatal hernia present). Normal stomach. Normal examined duodenum. In July 2024, she was admitted briefly for feeling unwell and transient acute kidney injury Since then, hemoglobin has never been normal with microcytic anemia  Last week, she has significant upper chest discomfort and excessive fatigue leading to ER visit and was found to have severe anemia Since blood transfusion, her symptoms has improved She denies shortness of breath on exertion or presyncopal episodes  She had not noticed any recent bleeding such as epistaxis, hematuria or hematochezia The patient denies over the counter NSAID ingestion. She is on chronic anticoagulation therapy on apixaban  She had no prior history or diagnosis of cancer except for basal cell carcinoma of the skin. Her age appropriate screening programs are up-to-date. She has pica with ice craving and eats a variety of diet, although in general, her diet is low in protein and meat She does not drink enough water.  In general, she drink approximately 32 ounces of liquid despite being on diuretic She never donated blood  The patient was prescribed oral iron supplements and she takes daily in the morning with breakfast since last week She received 2 doses of intravenous iron Feraheme in February 2025  Vitals:   10/27/23 1112  BP: (!) 148/53  Pulse: 83  Resp: 18  SpO2: 99%

## 2023-10-27 NOTE — Assessment & Plan Note (Addendum)
 With recent IV iron, anemia has resolved Iron studies are pending I recommend the patient to resume taking oral iron supplement half an hour before dinnertime I plan to see her again in 6 months for further follow-up She will get her labs checked with her primary care doctor within the next 2 to 3 months

## 2023-10-27 NOTE — Telephone Encounter (Signed)
 Called and given below message. She verbalized understanding.

## 2023-10-27 NOTE — Telephone Encounter (Signed)
-----   Message from Artis Delay sent at 10/27/2023  2:23 PM EDT ----- Tell her iron studies are good, ferritin over 200

## 2023-10-28 LAB — ERYTHROPOIETIN: Erythropoietin: 5.9 m[IU]/mL (ref 2.6–18.5)

## 2023-11-14 ENCOUNTER — Other Ambulatory Visit: Payer: Self-pay | Admitting: Internal Medicine

## 2023-11-16 ENCOUNTER — Telehealth: Payer: Self-pay | Admitting: Internal Medicine

## 2023-11-16 DIAGNOSIS — H02052 Trichiasis without entropian right lower eyelid: Secondary | ICD-10-CM | POA: Diagnosis not present

## 2023-11-16 MED ORDER — FUROSEMIDE 40 MG PO TABS: 40.0000 mg | ORAL_TABLET | ORAL | 2 refills | Status: DC

## 2023-11-16 NOTE — Telephone Encounter (Signed)
*  STAT* If patient is at the pharmacy, call can be transferred to refill team.   1. Which medications need to be refilled? (please list name of each medication and dose if known) furosemide (LASIX) 40 MG tablet   4. Which pharmacy/location (including street and city if local pharmacy) is medication to be sent to?  [id number] - [place], [place] - [address]     5. Do they need a 30 day or 90 day supply? 90

## 2023-11-17 NOTE — Telephone Encounter (Signed)
 RX sent to requested Pharmacy

## 2023-12-04 ENCOUNTER — Other Ambulatory Visit: Payer: Self-pay | Admitting: General Practice

## 2023-12-15 ENCOUNTER — Other Ambulatory Visit: Payer: Self-pay | Admitting: Internal Medicine

## 2023-12-15 DIAGNOSIS — I2510 Atherosclerotic heart disease of native coronary artery without angina pectoris: Secondary | ICD-10-CM | POA: Diagnosis not present

## 2023-12-15 DIAGNOSIS — R7303 Prediabetes: Secondary | ICD-10-CM | POA: Diagnosis not present

## 2023-12-15 DIAGNOSIS — N1832 Chronic kidney disease, stage 3b: Secondary | ICD-10-CM | POA: Diagnosis not present

## 2023-12-15 DIAGNOSIS — D509 Iron deficiency anemia, unspecified: Secondary | ICD-10-CM | POA: Diagnosis not present

## 2023-12-15 DIAGNOSIS — I480 Paroxysmal atrial fibrillation: Secondary | ICD-10-CM | POA: Diagnosis not present

## 2023-12-15 DIAGNOSIS — Z951 Presence of aortocoronary bypass graft: Secondary | ICD-10-CM | POA: Diagnosis not present

## 2023-12-15 DIAGNOSIS — Z6828 Body mass index (BMI) 28.0-28.9, adult: Secondary | ICD-10-CM | POA: Diagnosis not present

## 2023-12-15 DIAGNOSIS — E2839 Other primary ovarian failure: Secondary | ICD-10-CM | POA: Diagnosis not present

## 2023-12-15 DIAGNOSIS — M79674 Pain in right toe(s): Secondary | ICD-10-CM | POA: Diagnosis not present

## 2023-12-15 DIAGNOSIS — I700 Atherosclerosis of aorta: Secondary | ICD-10-CM | POA: Diagnosis not present

## 2023-12-15 DIAGNOSIS — I5032 Chronic diastolic (congestive) heart failure: Secondary | ICD-10-CM | POA: Diagnosis not present

## 2023-12-15 DIAGNOSIS — Z0000 Encounter for general adult medical examination without abnormal findings: Secondary | ICD-10-CM | POA: Diagnosis not present

## 2023-12-15 DIAGNOSIS — E559 Vitamin D deficiency, unspecified: Secondary | ICD-10-CM | POA: Diagnosis not present

## 2023-12-23 NOTE — Progress Notes (Deleted)
 Subjective:    Patient ID: Kara Bush, female    DOB: [birth year], 83 y.o.   MRN: [id number]  HPI   Pain Inventory Average Pain {NUMBERS; 0-10:5044} Pain Right Now {NUMBERS; 0-10:5044} My pain is {PAIN DESCRIPTION:21022940}  In the last 24 hours, has pain interfered with the following? General activity {NUMBERS; 0-10:5044} Relation with others {NUMBERS; 0-10:5044} Enjoyment of life {NUMBERS; 0-10:5044} What TIME of day is your pain at its worst? {time of day:24191} Sleep (in general) {BHH GOOD/FAIR/POOR:22877}  Pain is worse with: {ACTIVITIES:21022942} Pain improves with: {PAIN IMPROVES MWUX:32440102} Relief from Meds: {NUMBERS; 0-10:5044}  Family History  Problem Relation Age of Onset   Heart failure Mother        at 24   Heart disease Father    Heart attack Father        @ 1   Healthy Sister    Cancer Brother        pancreatic    Cancer Paternal Aunt        BREAST   Heart failure Maternal Grandmother    Heart attack Maternal Grandfather    Heart attack Paternal Grandmother        at 83   Healthy Daughter    Healthy Daughter    Social History   Socioeconomic History   Marital status: Divorced    Spouse name: Not on file   Number of children: 2   Years of education: college    Highest education level: Not on file  Occupational History    Employer: RETIRED  Tobacco Use   Smoking status: Former    Current packs/day: 0.00    Average packs/day: 2.5 packs/day for 20.0 years (50.0 ttl pk-yrs)    Types: Cigarettes    Start date: 06/29/1970    Quit date: 06/29/1990    Years since quitting: 33.5    Passive exposure: Never   Smokeless tobacco: Never  Vaping Use   Vaping status: Never Used  Substance and Sexual Activity   Alcohol  use: No   Drug use: No   Sexual activity: Not on file  Other Topics Concern   Not on file  Social History Narrative   Not on file   Social Drivers of Health   Financial Resource Strain: Not on file  Food Insecurity:  No Food Insecurity (09/04/2023)   Hunger Vital Sign    Worried About Running Out of Food in the Last Year: Never true    Ran Out of Food in the Last Year: Never true  Transportation Needs: No Transportation Needs (09/04/2023)   PRAPARE - Administrator, Civil Service (Medical): No    Lack of Transportation (Non-Medical): No  Physical Activity: Not on file  Stress: Not on file  Social Connections: Socially Isolated (09/04/2023)   Social Connection and Isolation Panel [NHANES]    Frequency of Communication with Friends and Family: More than three times a week    Frequency of Social Gatherings with Friends and Family: Three times a week    Attends Religious Services: Never    Active Member of Clubs or Organizations: No    Attends Banker Meetings: Never    Marital Status: Divorced   Past Surgical History:  Procedure Laterality Date   AORTOBIEXTERNAL ILIAC BYPASS  1991   BIOPSY  11/25/2022   Procedure: BIOPSY;  Surgeon: Alvis Jourdain, MD;  Location: [facility] ENDOSCOPY;  Service: Gastroenterology;;   CATARACT EXTRACTION Left 09/2019   CHOLECYSTECTOMY  1997   COLONOSCOPY WITH  PROPOFOL  N/A 11/25/2022   Procedure: COLONOSCOPY WITH PROPOFOL ;  Surgeon: Alvis Jourdain, MD;  Location: [facility] ENDOSCOPY;  Service: Gastroenterology;  Laterality: N/A;   CORONARY ANGIOPLASTY WITH STENT PLACEMENT  2006   to LAD, ATRETIC LIMA AT THAT TIME ALSO   CORONARY ANGIOPLASTY WITH STENT PLACEMENT  2007   STENT TO PROX VG-DIAG, OTHER STENTS PATENT   CORONARY ARTERY BYPASS GRAFT  1998   LIMA-LAD;VG-DIAG & RCA   CORONARY STENT PLACEMENT  2005   TO LAD & LCX-STENTS,   ESOPHAGOGASTRODUODENOSCOPY (EGD) WITH PROPOFOL  N/A 11/25/2022   Procedure: ESOPHAGOGASTRODUODENOSCOPY (EGD) WITH PROPOFOL ;  Surgeon: Alvis Jourdain, MD;  Location: [facility] ENDOSCOPY;  Service: Gastroenterology;  Laterality: N/A;   HERNIA REPAIR     TUBAL LIGATION     BTL   Past Surgical History:  Procedure Laterality Date   AORTOBIEXTERNAL  ILIAC BYPASS  1991   BIOPSY  11/25/2022   Procedure: BIOPSY;  Surgeon: Alvis Jourdain, MD;  Location: [facility] ENDOSCOPY;  Service: Gastroenterology;;   CATARACT EXTRACTION Left 09/2019   CHOLECYSTECTOMY  1997   COLONOSCOPY WITH PROPOFOL  N/A 11/25/2022   Procedure: COLONOSCOPY WITH PROPOFOL ;  Surgeon: Alvis Jourdain, MD;  Location: [facility] ENDOSCOPY;  Service: Gastroenterology;  Laterality: N/A;   CORONARY ANGIOPLASTY WITH STENT PLACEMENT  2006   to LAD, ATRETIC LIMA AT THAT TIME ALSO   CORONARY ANGIOPLASTY WITH STENT PLACEMENT  2007   STENT TO PROX VG-DIAG, OTHER STENTS PATENT   CORONARY ARTERY BYPASS GRAFT  1998   LIMA-LAD;VG-DIAG & RCA   CORONARY STENT PLACEMENT  2005   TO LAD & LCX-STENTS,   ESOPHAGOGASTRODUODENOSCOPY (EGD) WITH PROPOFOL  N/A 11/25/2022   Procedure: ESOPHAGOGASTRODUODENOSCOPY (EGD) WITH PROPOFOL ;  Surgeon: Alvis Jourdain, MD;  Location: [facility] ENDOSCOPY;  Service: Gastroenterology;  Laterality: N/A;   HERNIA REPAIR     TUBAL LIGATION     BTL   Past Medical History:  Diagnosis Date   Abdominal aortic aneurysm (HCC)    Anemia, improved 01/11/2013   Basal cell carcinoma    CAD (coronary artery disease)    hx CABG 1998, last cath 2007 with PCI and stenting to the proximal segment of the vein graft to the diagonal branch. DES Taxus was placed   Cervicalgia    Chronic fatigue fibromyalgia syndrome    Chronic pain syndrome    Depression    Fibromyalgia    History of nuclear stress test 08/07/2011   lexiscan ; no evidence of ischemia or infarct; low risk    Hyperlipidemia    Hypertension    OSA (obstructive sleep apnea)    PAD (peripheral artery disease) (HCC)    Pes anserine bursitis    Pulmonary embolism (HCC) 01/11/2013   S/P resection of aortic aneurysm    Trochanteric bursitis    Ulcerative colitis (HCC)    There were no vitals taken for this visit.  Opioid Risk Score:   Fall Risk Score:  `1  Depression screen PHQ 2/9     10/22/2023   10:20 AM 08/20/2023    1:35 PM  04/22/2023    2:28 PM 02/25/2023    1:31 PM 10/17/2022    1:38 PM 08/18/2022    1:15 PM 06/23/2022    1:03 PM  Depression screen PHQ 2/9  Decreased Interest 0 0 0 0 0 0 1  Down, Depressed, Hopeless 0 0 0 0 0 0 1  PHQ - 2 Score 0 0 0 0 0 0 2    Review of Systems     Objective:  Physical Exam        Assessment & Plan:

## 2023-12-24 ENCOUNTER — Encounter: Admitting: Registered Nurse

## 2023-12-24 NOTE — Progress Notes (Signed)
 Subjective:    Patient ID: Kara Bush, female    DOB: 1940/08/31, 83 y.o.   MRN: [id number]  HPI: Kara Bush is a 83 y.o. female who returns for follow up appointment for chronic pain and medication refill. She states her pain is located in her mid- back mainly right side, lower back radiating into her right lower extremity, right hip and right knee pain. She rates her pain 4. Her current exercise regime is walking and performing stretching exercises.  Kara Bush Morphine  equivalent is 25.00 MME.   Oral Swab was Performed today.      Pain Inventory Average Pain 4 Pain Right Now 4 My pain is constant and aching  In the last 24 hours, has pain interfered with the following? General activity 4 Relation with others 0 Enjoyment of life 4 What TIME of day is your pain at its worst? daytime Sleep (in general) Good  Pain is worse with: bending and some activites Pain improves with: rest, pacing activities, and medication Relief from Meds: 6  Family History  Problem Relation Age of Onset   Heart failure Mother        at 76   Heart disease Father    Heart attack Father        @ 40   Healthy Sister    Cancer Brother        pancreatic    Cancer Paternal Aunt        BREAST   Heart failure Maternal Grandmother    Heart attack Maternal Grandfather    Heart attack Paternal Grandmother        at 13   Healthy Daughter    Healthy Daughter    Social History   Socioeconomic History   Marital status: Divorced    Spouse name: Not on file   Number of children: 2   Years of education: college    Highest education level: Not on file  Occupational History    Employer: RETIRED  Tobacco Use   Smoking status: Former    Current packs/day: 0.00    Average packs/day: 2.5 packs/day for 20.0 years (50.0 ttl pk-yrs)    Types: Cigarettes    Start date: 06/29/1970    Quit date: 06/29/1990    Years since quitting: 33.5    Passive exposure: Never   Smokeless tobacco: Never   Vaping Use   Vaping status: Never Used  Substance and Sexual Activity   Alcohol  use: No   Drug use: No   Sexual activity: Not on file  Other Topics Concern   Not on file  Social History Narrative   Not on file   Social Drivers of Health   Financial Resource Strain: Not on file  Food Insecurity: No Food Insecurity (09/04/2023)   Hunger Vital Sign    Worried About Running Out of Food in the Last Year: Never true    Ran Out of Food in the Last Year: Never true  Transportation Needs: No Transportation Needs (09/04/2023)   PRAPARE - Administrator, Civil Service (Medical): No    Lack of Transportation (Non-Medical): No  Physical Activity: Not on file  Stress: Not on file  Social Connections: Socially Isolated (09/04/2023)   Social Connection and Isolation Panel [NHANES]    Frequency of Communication with Friends and Family: More than three times a week    Frequency of Social Gatherings with Friends and Family: Three times a week    Attends Religious Services: Never  Active Member of Clubs or Organizations: No    Attends Banker Meetings: Never    Marital Status: Divorced   Past Surgical History:  Procedure Laterality Date   AORTOBIEXTERNAL ILIAC BYPASS  1991   BIOPSY  11/25/2022   Procedure: BIOPSY;  Surgeon: Alvis Jourdain, MD;  Location: [facility] ENDOSCOPY;  Service: Gastroenterology;;   CATARACT EXTRACTION Left 09/2019   CHOLECYSTECTOMY  1997   COLONOSCOPY WITH PROPOFOL  N/A 11/25/2022   Procedure: COLONOSCOPY WITH PROPOFOL ;  Surgeon: Alvis Jourdain, MD;  Location: [facility] ENDOSCOPY;  Service: Gastroenterology;  Laterality: N/A;   CORONARY ANGIOPLASTY WITH STENT PLACEMENT  2006   to LAD, ATRETIC LIMA AT THAT TIME ALSO   CORONARY ANGIOPLASTY WITH STENT PLACEMENT  2007   STENT TO PROX VG-DIAG, OTHER STENTS PATENT   CORONARY ARTERY BYPASS GRAFT  1998   LIMA-LAD;VG-DIAG & RCA   CORONARY STENT PLACEMENT  2005   TO LAD & LCX-STENTS,   ESOPHAGOGASTRODUODENOSCOPY  (EGD) WITH PROPOFOL  N/A 11/25/2022   Procedure: ESOPHAGOGASTRODUODENOSCOPY (EGD) WITH PROPOFOL ;  Surgeon: Alvis Jourdain, MD;  Location: [facility] ENDOSCOPY;  Service: Gastroenterology;  Laterality: N/A;   HERNIA REPAIR     TUBAL LIGATION     BTL   Past Surgical History:  Procedure Laterality Date   AORTOBIEXTERNAL ILIAC BYPASS  1991   BIOPSY  11/25/2022   Procedure: BIOPSY;  Surgeon: Alvis Jourdain, MD;  Location: [facility] ENDOSCOPY;  Service: Gastroenterology;;   CATARACT EXTRACTION Left 09/2019   CHOLECYSTECTOMY  1997   COLONOSCOPY WITH PROPOFOL  N/A 11/25/2022   Procedure: COLONOSCOPY WITH PROPOFOL ;  Surgeon: Alvis Jourdain, MD;  Location: [facility] ENDOSCOPY;  Service: Gastroenterology;  Laterality: N/A;   CORONARY ANGIOPLASTY WITH STENT PLACEMENT  2006   to LAD, ATRETIC LIMA AT THAT TIME ALSO   CORONARY ANGIOPLASTY WITH STENT PLACEMENT  2007   STENT TO PROX VG-DIAG, OTHER STENTS PATENT   CORONARY ARTERY BYPASS GRAFT  1998   LIMA-LAD;VG-DIAG & RCA   CORONARY STENT PLACEMENT  2005   TO LAD & LCX-STENTS,   ESOPHAGOGASTRODUODENOSCOPY (EGD) WITH PROPOFOL  N/A 11/25/2022   Procedure: ESOPHAGOGASTRODUODENOSCOPY (EGD) WITH PROPOFOL ;  Surgeon: Alvis Jourdain, MD;  Location: [facility] ENDOSCOPY;  Service: Gastroenterology;  Laterality: N/A;   HERNIA REPAIR     TUBAL LIGATION     BTL   Past Medical History:  Diagnosis Date   Abdominal aortic aneurysm (HCC)    Anemia, improved 01/11/2013   Basal cell carcinoma    CAD (coronary artery disease)    hx CABG 1998, last cath 2007 with PCI and stenting to the proximal segment of the vein graft to the diagonal branch. DES Taxus was placed   Cervicalgia    Chronic fatigue fibromyalgia syndrome    Chronic pain syndrome    Depression    Fibromyalgia    History of nuclear stress test 08/07/2011   lexiscan ; no evidence of ischemia or infarct; low risk    Hyperlipidemia    Hypertension    OSA (obstructive sleep apnea)    PAD (peripheral artery disease) (HCC)    Pes anserine  bursitis    Pulmonary embolism (HCC) 01/11/2013   S/P resection of aortic aneurysm    Trochanteric bursitis    Ulcerative colitis (HCC)    BP 127/72   Pulse 65   Ht 5\' 4"  (1.626 m)   Wt 162 lb 9.6 oz (73.8 kg)   SpO2 93%   BMI 27.91 kg/m   Opioid Risk Score:   Fall Risk Score:  `1  Depression screen  PHQ 2/9     12/25/2023    1:39 PM 10/22/2023   10:20 AM 08/20/2023    1:35 PM 04/22/2023    2:28 PM 02/25/2023    1:31 PM 10/17/2022    1:38 PM 08/18/2022    1:15 PM  Depression screen PHQ 2/9  Decreased Interest 0 0 0 0 0 0 0  Down, Depressed, Hopeless 0 0 0 0 0 0 0  PHQ - 2 Score 0 0 0 0 0 0 0    Review of Systems  Musculoskeletal:  Positive for back pain.       Right hip and knee  All other systems reviewed and are negative.      Objective:   Physical Exam Vitals and nursing note reviewed.  Constitutional:      Appearance: Normal appearance.  Cardiovascular:     Rate and Rhythm: Normal rate and regular rhythm.     Pulses: Normal pulses.     Heart sounds: Normal heart sounds.  Pulmonary:     Effort: Pulmonary effort is normal.     Breath sounds: Normal breath sounds.  Musculoskeletal:     Comments: Normal Muscle Bulk and Muscle Testing Reveals:  Upper Extremities: Right: Decreased ROM 90 Degrees and Muscle Strength 5/5 Left Upper Extremity: Full ROM and Muscle Strength 5/5 Thoracic Paraspinal Tenderness: T-4- T-6 Mainly Right Side   Lumbar Paraspinal Tenderness: L-4-L-5 Mainly Right Side Right Greater Trochanter Tenderness Lower Extremities: Full ROM and Muscle Strength 5/5 Arises from Table slowly Narrow Based  Gait     Skin:    General: Skin is warm and dry.  Neurological:     Mental Status: She is alert and oriented to person, place, and time.  Psychiatric:        Mood and Affect: Mood normal.        Behavior: Behavior normal.         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 12/25/2023 2. Fibromyalgia: Continue  Home Exercise Program. Continue to Monitor.12/25/2023 3. Bilateral intermittent hand numbness: Carpal tunnel syndrome versus C6 radiculopathy mild: No complaints today. Continue to Monitor. 12/25/2023 4. Chronic Midline Low Back Pain/ LBP: Refilled: Norco 5/325mg  # 140 pills--use one pill every 6 hours as needed for pain. A second prescription was e-scribe for the following month 12/25/2023 We will continue the opioid monitoring program, this consists of regular clinic visits, examinations, urine drug screen, pill counts as well as use of [place]  Controlled Substance Reporting system. A 12 month History has been reviewed on the [place]  Controlled Substance Reporting System on 12/25/2023. 5. Muscle Spasm: Continue current medication regimen with  Robaxin . 12/25/2023 6.Right  Greater Trochanteric Bursitis:Continue to Alternate with heat and ice therapy. Continue current medication regime. 11/14/202 7. Left Foot pain: No complaints today.Continue with HEP as Tolerated. Continue to Monitor. 12/25/2023 8. Right Lateral Epicondylitis Pain: No complaints Today. Continue HEP as Tolerated. Continue to Monitor. 12/25/2023 9. Right Shoulder Tendonitis: Continue Current Medication Regimen. Continue to Alternate Ice and Heat Therapy. 12/25/2023 10 Right Chronic Knee Pain: .Continue HEP as Tolerated. Continue to Monitor. 12/25/2023   F/U in 2 month

## 2023-12-25 ENCOUNTER — Encounter: Payer: Self-pay | Admitting: Registered Nurse

## 2023-12-25 ENCOUNTER — Encounter: Attending: Registered Nurse | Admitting: Registered Nurse

## 2023-12-25 VITALS — BP 127/72 | HR 65 | Ht 64.0 in | Wt 162.6 lb

## 2023-12-25 DIAGNOSIS — G8929 Other chronic pain: Secondary | ICD-10-CM | POA: Diagnosis not present

## 2023-12-25 DIAGNOSIS — M5416 Radiculopathy, lumbar region: Secondary | ICD-10-CM | POA: Diagnosis not present

## 2023-12-25 DIAGNOSIS — M797 Fibromyalgia: Secondary | ICD-10-CM | POA: Insufficient documentation

## 2023-12-25 DIAGNOSIS — M546 Pain in thoracic spine: Secondary | ICD-10-CM | POA: Insufficient documentation

## 2023-12-25 DIAGNOSIS — Z79891 Long term (current) use of opiate analgesic: Secondary | ICD-10-CM | POA: Diagnosis not present

## 2023-12-25 DIAGNOSIS — N958 Other specified menopausal and perimenopausal disorders: Secondary | ICD-10-CM | POA: Diagnosis not present

## 2023-12-25 DIAGNOSIS — Z5181 Encounter for therapeutic drug level monitoring: Secondary | ICD-10-CM | POA: Diagnosis not present

## 2023-12-25 DIAGNOSIS — E2839 Other primary ovarian failure: Secondary | ICD-10-CM | POA: Diagnosis not present

## 2023-12-25 DIAGNOSIS — M7061 Trochanteric bursitis, right hip: Secondary | ICD-10-CM | POA: Diagnosis not present

## 2023-12-25 DIAGNOSIS — G894 Chronic pain syndrome: Secondary | ICD-10-CM | POA: Diagnosis not present

## 2023-12-25 MED ORDER — HYDROCODONE-ACETAMINOPHEN 5-325 MG PO TABS: 1.0000 | ORAL_TABLET | Freq: Four times a day (QID) | ORAL | 0 refills | Status: DC | PRN

## 2023-12-29 LAB — DRUG TOX MONITOR 1 W/CONF, ORAL FLD
Amphetamines: NEGATIVE ng/mL (ref <10)
Barbiturates: NEGATIVE ng/mL (ref <10)
Benzodiazepines: NEGATIVE ng/mL (ref <0.50)
Buprenorphine: NEGATIVE ng/mL (ref <0.10)
Cocaine: NEGATIVE ng/mL (ref <5.0)
Codeine: NEGATIVE ng/mL (ref <2.5)
Dihydrocodeine: 17.5 ng/mL — ABNORMAL HIGH (ref <2.5)
Fentanyl: NEGATIVE ng/mL (ref <0.10)
Heroin Metabolite: NEGATIVE ng/mL (ref <1.0)
Hydrocodone: 224.4 ng/mL — ABNORMAL HIGH (ref <2.5)
Hydromorphone: NEGATIVE ng/mL (ref <2.5)
MARIJUANA: NEGATIVE ng/mL (ref <2.5)
MDMA: NEGATIVE ng/mL (ref <10)
Meprobamate: NEGATIVE ng/mL (ref <2.5)
Methadone: NEGATIVE ng/mL (ref <5.0)
Morphine: NEGATIVE ng/mL (ref <2.5)
Nicotine Metabolite: NEGATIVE ng/mL (ref <5.0)
Norhydrocodone: 6.4 ng/mL — ABNORMAL HIGH (ref <2.5)
Noroxycodone: NEGATIVE ng/mL (ref <2.5)
Opiates: POSITIVE ng/mL — AB (ref <2.5)
Oxycodone: NEGATIVE ng/mL (ref <2.5)
Oxymorphone: NEGATIVE ng/mL (ref <2.5)
Phencyclidine: NEGATIVE ng/mL (ref <10)
Tapentadol: NEGATIVE ng/mL (ref <5.0)
Tramadol: NEGATIVE ng/mL (ref <5.0)
Zolpidem: NEGATIVE ng/mL (ref <5.0)

## 2023-12-29 LAB — DRUG TOX ALC METAB W/CON, ORAL FLD: Alcohol Metabolite: NEGATIVE ng/mL (ref <25)

## 2023-12-30 ENCOUNTER — Other Ambulatory Visit: Payer: Self-pay | Admitting: Vascular Surgery

## 2023-12-30 DIAGNOSIS — H02052 Trichiasis without entropian right lower eyelid: Secondary | ICD-10-CM | POA: Diagnosis not present

## 2023-12-30 DIAGNOSIS — I70213 Atherosclerosis of native arteries of extremities with intermittent claudication, bilateral legs: Secondary | ICD-10-CM

## 2024-01-20 ENCOUNTER — Ambulatory Visit (HOSPITAL_COMMUNITY)

## 2024-01-20 ENCOUNTER — Ambulatory Visit (HOSPITAL_COMMUNITY)
Admission: RE | Admit: 2024-01-20 | Discharge: 2024-01-20 | Discharge status: Home / Self Care | Source: Ambulatory Visit | Attending: Vascular Surgery

## 2024-01-20 ENCOUNTER — Ambulatory Visit (HOSPITAL_COMMUNITY)
Admission: RE | Admit: 2024-01-20 | Discharge: 2024-01-20 | Disposition: A | Discharge status: Home / Self Care | Source: Ambulatory Visit | Attending: Vascular Surgery | Admitting: Vascular Surgery

## 2024-01-20 VITALS — BP 114/69 | HR 64 | Temp 98.0°F | Ht 64.0 in | Wt 163.3 lb

## 2024-01-20 DIAGNOSIS — I70213 Atherosclerosis of native arteries of extremities with intermittent claudication, bilateral legs: Secondary | ICD-10-CM

## 2024-01-20 DIAGNOSIS — Z95828 Presence of other vascular implants and grafts: Secondary | ICD-10-CM | POA: Insufficient documentation

## 2024-01-20 LAB — VAS US ABI WITH/WO TBI
Left ABI: 0.56
Right ABI: 0.63

## 2024-01-20 NOTE — Progress Notes (Signed)
 Office Note     CC:  follow up Requesting Provider:  Mordechai April, DO  HPI: Kara Bush is a 83 y.o. (10/20/1940) female who presents for surveillance follow up of PAD. She has a very remote history of aorto biiliac bypass > 30 years ago. 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 We have been following her lower extremity claudication. This has been non lifestyle limiting. She has not had any rest pain or tissue loss. She is not a good surgical candidate so lifestyle and medical management have been her primary goal.  Today she reports overall she is doing well. She has some lower extremity pain on prolonged ambulation but she feels this is unchanged from last year. She is having some more lower back and right hip pain but she feels this is unrelated. She uses a rolling walker to ambulate. She reports walking more than she was last year. Her swelling in her legs is improved as well. She denies any pain at rest in her legs. No tissue loss. She denies any abdominal pain and no new or severe back pain.   The pt is a statin for cholesterol management.    The pt is not an aspirin .  Other AC:  Eliquis  The pt is on BB and Entresto  for hypertension.  The pt does not have diabetes. Tobacco hx:  former  Past Medical History:  Diagnosis Date   Abdominal aortic aneurysm (HCC)    Anemia, improved 01/11/2013   Basal cell carcinoma    CAD (coronary artery disease)    hx CABG 1998, last cath 2007 with PCI and stenting to the proximal segment of the vein graft to the diagonal branch. DES Taxus was placed   Cervicalgia    Chronic fatigue fibromyalgia syndrome    Chronic pain syndrome    Depression    Fibromyalgia    History of nuclear stress test 08/07/2011   lexiscan ; no evidence of ischemia or infarct; low risk    Hyperlipidemia    Hypertension    OSA (obstructive sleep apnea)    PAD (peripheral artery disease)  (HCC)    Pes anserine bursitis    Pulmonary embolism (HCC) 01/11/2013   S/P resection of aortic aneurysm    Trochanteric bursitis    Ulcerative colitis [facility])     Past Surgical History:  Procedure Laterality Date   AORTOBIEXTERNAL ILIAC BYPASS  1991   BIOPSY  11/25/2022   Procedure: BIOPSY;  Surgeon: Alvis Jourdain, MD;  Location: [facility] ENDOSCOPY;  Service: Gastroenterology;;   CATARACT EXTRACTION Left 09/2019   CHOLECYSTECTOMY  1997   COLONOSCOPY WITH PROPOFOL  N/A 11/25/2022   Procedure: COLONOSCOPY WITH PROPOFOL ;  Surgeon: Alvis Jourdain, MD;  Location: [facility] ENDOSCOPY;  Service: Gastroenterology;  Laterality: N/A;   CORONARY ANGIOPLASTY WITH STENT PLACEMENT  2006   to LAD, ATRETIC LIMA AT THAT TIME ALSO   CORONARY ANGIOPLASTY WITH STENT PLACEMENT  2007   STENT TO PROX VG-DIAG, OTHER STENTS PATENT   CORONARY ARTERY BYPASS GRAFT  1998   LIMA-LAD;VG-DIAG & RCA   CORONARY STENT PLACEMENT  2005   TO LAD & LCX-STENTS,   ESOPHAGOGASTRODUODENOSCOPY (EGD) WITH PROPOFOL  N/A 11/25/2022   Procedure: ESOPHAGOGASTRODUODENOSCOPY (EGD) WITH PROPOFOL ;  Surgeon: Alvis Jourdain, MD;  Location: [facility] ENDOSCOPY;  Service: Gastroenterology;  Laterality: N/A;   HERNIA REPAIR     TUBAL LIGATION     BTL    Social History   Socioeconomic History  Marital status: Divorced    Spouse name: Not on file   Number of children: 2   Years of education: college    Highest education level: Not on file  Occupational History    Employer: RETIRED  Tobacco Use   Smoking status: Former    Current packs/day: 0.00    Average packs/day: 2.5 packs/day for 20.0 years (50.0 ttl pk-yrs)    Types: Cigarettes    Start date: 06/29/1970    Quit date: 06/29/1990    Years since quitting: 33.5    Passive exposure: Never   Smokeless tobacco: Never  Vaping Use   Vaping status: Never Used  Substance and Sexual Activity   Alcohol  use: No   Drug use: No   Sexual activity: Not on file  Other Topics Concern   Not on file  Social  History Narrative   Not on file   Social Drivers of Health   Financial Resource Strain: Not on file  Food Insecurity: No Food Insecurity (09/04/2023)   Hunger Vital Sign    Worried About Running Out of Food in the Last Year: Never true    Ran Out of Food in the Last Year: Never true  Transportation Needs: No Transportation Needs (09/04/2023)   PRAPARE - Administrator, Civil Service (Medical): No    Lack of Transportation (Non-Medical): No  Physical Activity: Not on file  Stress: Not on file  Social Connections: Socially Isolated (09/04/2023)   Social Connection and Isolation Panel    Frequency of Communication with Friends and Family: More than three times a week    Frequency of Social Gatherings with Friends and Family: Three times a week    Attends Religious Services: Never    Active Member of Clubs or Organizations: No    Attends Banker Meetings: Never    Marital Status: Divorced  Catering manager Violence: Not At Risk (09/04/2023)   Humiliation, Afraid, Rape, and Kick questionnaire    Fear of Current or Ex-Partner: No    Emotionally Abused: No    Physically Abused: No    Sexually Abused: No   Family History  Problem Relation Age of Onset   Heart failure Mother        at 22   Heart disease Father    Heart attack Father        @ 9   Healthy Sister    Cancer Brother        pancreatic    Cancer Paternal Aunt        BREAST   Heart failure Maternal Grandmother    Heart attack Maternal Grandfather    Heart attack Paternal Grandmother        at 64   Healthy Daughter    Healthy Daughter     Current Outpatient Medications  Medication Sig Dispense Refill   acetaminophen  (TYLENOL ) 500 MG tablet Take 500 mg by mouth as needed for moderate pain (pain score 4-6).     apixaban  (ELIQUIS ) 5 MG TABS tablet Take 1 tablet (5 mg total) by mouth 2 (two) times daily. 180 tablet 1   atorvastatin  (LIPITOR) 40 MG tablet TAKE 1 TABLET BY MOUTH EVERY DAY 90 tablet  2   citalopram  (CELEXA ) 20 MG tablet Take 1 tablet (20 mg total) by mouth daily. 30 tablet 0   furosemide  (LASIX ) 40 MG tablet Take 1 tablet (40 mg total) by mouth every Monday, Wednesday, and Friday. 36 tablet 2   HYDROcodone -acetaminophen  (NORCO/VICODIN) 5-325 MG  tablet Take 1 tablet by mouth 4 (four) times daily as needed for moderate pain (pain score 4-6). May Take an extra 0.5 to one tablet when pain is severe. 140 tablet 0   Iron , Ferrous Sulfate , 325 (65 Fe) MG TABS Take 325 mg by mouth 2 (two) times daily. 60 tablet 2   isosorbide  mononitrate (IMDUR ) 30 MG 24 hr tablet TAKE 1 TABLET BY MOUTH EVERY DAY 90 tablet 1   methocarbamol  (ROBAXIN ) 500 MG tablet 1 capsule orally every 8 hrs as needed     metoprolol  succinate (TOPROL -XL) 25 MG 24 hr tablet TAKE 3 TABLETS (75 MG TOTAL) BY MOUTH DAILY. TAKE WITH OR IMMEDIATELY FOLLOWING A MEAL. 270 tablet 2   MULTIPLE VITAMIN PO      pantoprazole  (PROTONIX ) 40 MG tablet Take 1 tablet (40 mg total) by mouth daily.     potassium chloride  (KLOR-CON  M) 10 MEQ tablet Take 1 tablet (10 mEq total) by mouth every Monday, Wednesday, and Friday. 10 tablet 0   potassium chloride  (KLOR-CON ) 10 MEQ tablet Take 1 tablet (10 mEq total) by mouth 3 (three) times a week. WITH LAIX AS NEEDED 36 tablet 2   sacubitril -valsartan  (ENTRESTO ) 24-26 MG Take 1 tablet by mouth 2 (two) times daily. 180 tablet 3   spironolactone  (ALDACTONE ) 25 MG tablet TAKE 1 TABLET (25 MG TOTAL) BY MOUTH DAILY. 90 tablet 1   traZODone  (DESYREL ) 50 MG tablet Take 1 tablet (50 mg total) by mouth at bedtime as needed for sleep. 30 tablet 0   No current facility-administered medications for this visit.    Allergies  Allergen Reactions   Biotin Hives   Lisinopril  Cough   Atenolol Other (See Comments)    colitis Other reaction(s): Bowel issues   Duloxetine Hcl Other (See Comments)    Mad pt feel spacey Other reaction(s): shaky; tired in the PMs   Lyrica [Pregabalin] Other (See Comments)     Made pt feel spacey   Penicillin G Rash    Other reaction(s): rash   Penicillins Rash   Sulfamethoxazole-Trimethoprim Nausea And Vomiting    Other reaction(s): elevated liver functions     REVIEW OF SYSTEMS:  [X]  denotes positive finding, [ ]  denotes negative finding Cardiac  Comments:  Chest pain or chest pressure:    Shortness of breath upon exertion:    Short of breath when lying flat:    Irregular heart rhythm:        Vascular    Pain in calf, thigh, or hip brought on by ambulation:    Pain in feet at night that wakes you up from your sleep:     Blood clot in your veins:    Leg swelling:         Pulmonary    Oxygen  at home:    Productive cough:     Wheezing:         Neurologic    Sudden weakness in arms or legs:     Sudden numbness in arms or legs:     Sudden onset of difficulty speaking or slurred speech:    Temporary loss of vision in one eye:     Problems with dizziness:         Gastrointestinal    Blood in stool:     Vomited blood:         Genitourinary    Burning when urinating:     Blood in urine:        Psychiatric    Major  depression:         Hematologic    Bleeding problems:    Problems with blood clotting too easily:        Skin    Rashes or ulcers:        Constitutional    Fever or chills:      PHYSICAL EXAMINATION:  Vitals:   01/20/24 0938  BP: 114/69  Pulse: 64  Temp: 98 F (36.7 C)  TempSrc: Temporal  SpO2: 92%  Weight: 163 lb 4.8 oz (74.1 kg)  Height: 5' 4 (1.626 m)    General:  WDWN in NAD; vital signs documented above Gait: Normal HENT: WNL, normocephalic Pulmonary: normal non-labored breathing Cardiac: regular HR Abdomen: distended, non tender Vascular Exam/Pulses: 2+ radial, 2+ femoral, palpable right DP, left faint PT pulse. Feet warm and well perfused Extremities: without ischemic changes, without Gangrene , without cellulitis; without open wounds;  Musculoskeletal: no muscle wasting or atrophy  Neurologic: A&O  X 3 Psychiatric:  The pt has Normal affect.   Non-Invasive Vascular Imaging:   -------+-----------+-----------+------------+------------+  ABI/TBIToday's ABIToday's TBIPrevious ABIPrevious TBI  +-------+-----------+-----------+------------+------------+  Right 0.63       0.51       0.70        0.64          +-------+-----------+-----------+------------+------------+  Left  0.56       0.46       0.74        0.51          +-------+-----------+-----------+------------+------------+  Biphasic flow bilaterally  VAS US  Aorta/ IVC/ Iliacs: Summary:  Stenosis: +----------+-------------+  Location  Stenosis       +----------+-------------+  Prox Aorta>50% stenosis  +----------+-------------+   Patent aortobiiliac bypass graft with inflow stenosis (proximal aorta) velocities suggesting a >50 cm/s stenosis. Distal portions of the graft and distal anastomoses are difficult to visualize due to overlying bowel gas.   ASSESSMENT/PLAN:: 83 y.o. female here for surveillance follow up of PAD. She has a very remote history of aorto biiliac bypass > 30 years ago. 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 We have been following her lower extremity claudication. This remains stable. She has no rest pain or tissue loss.  - ABI's are slightly decreased from prior study in 2023 - Duplex shows overall stable findings. Elevated velocities remain in the proximal aorta. These have not changed significantly over past several years - encourage walking regimen to promote collaterals  -continue Eliquis , Statin - She will follow up in 1 year with repeat Aorta/ IVC/ Iliac duplex and ABI   Deneen Finical, PA-C [facility] [phone]  Clinic MD:   cain

## 2024-01-21 NOTE — Progress Notes (Signed)
 Office Visit Note  Patient: Kara Bush             Date of Birth: 1940/12/08           MRN: [id number]             PCP: Kara April, DO Referring: Kara April, DO Visit Date: 01/22/2024 Occupation: @GUAROCC @  Subjective:  Right sided lower back pain   History of Present Illness: Kara Bush is a 83 y.o. female with history of osteoarthritis and fibromyalgia.  Patient was last seen in the office on 03/17/2023.  Since her last office visit she has establish care with pain management and has been taking Norco for pain relief.  Patient presents today with recurrence of right-sided lower back pain with radiating pain down to her right ankle.  Patient states that the symptoms recurred about 1 week ago.  She has had increased discomfort especially when laying on her right side at night.  She denies any injury or fall prior to the onset of symptoms.  Patient states that she has not continued home exercises recommended by physical therapy. She has been using a rollator walker to assist with ambulation.  Patient states that in the past her symptoms were alleviated by physical therapy as well as dry needling and massage.  Patient states that she is scheduled for dry needling and massage at the end of July but is concerned that her symptoms are going to interfere with her level of activity and mobility. She continues to have myofascial pain in both lower extremities due to fibromyalgia.     Activities of Daily Living:  Patient reports morning stiffness for 1 hour.   Patient Reports nocturnal pain.  Difficulty dressing/grooming: Denies Difficulty climbing stairs: Reports Difficulty getting out of chair: Denies Difficulty using hands for taps, buttons, cutlery, and/or writing: Denies  Review of Systems  Constitutional:  Negative for fatigue.  HENT:  Positive for mouth dryness. Negative for mouth sores.   Eyes:  Positive for dryness.  Respiratory:  Negative for shortness of  breath.   Cardiovascular:  Negative for chest pain and palpitations.  Gastrointestinal:  Negative for blood in stool, constipation and diarrhea.  Endocrine: Negative for increased urination.  Genitourinary:  Positive for involuntary urination.  Musculoskeletal:  Positive for joint pain, joint pain, myalgias, muscle weakness, morning stiffness, muscle tenderness and myalgias. Negative for gait problem and joint swelling.  Skin:  Negative for color change, rash, hair loss and sensitivity to sunlight.  Allergic/Immunologic: Negative for susceptible to infections.  Neurological:  Negative for dizziness and headaches.  Hematological:  Negative for swollen glands.  Psychiatric/Behavioral:  Negative for depressed mood and sleep disturbance. The patient is not nervous/anxious.     PMFS History:  Patient Active Problem List   Diagnosis Date Noted   Iron  deficiency anemia 09/07/2023   Acute on chronic systolic CHF (congestive heart failure) (HCC) 03/03/2023   Acute on chronic diastolic congestive heart failure (HCC) 03/02/2023   Pulmonary embolism with infarction (HCC) 03/02/2023   Chronic heart failure with preserved ejection fraction (HFpEF) (HCC) 02/11/2023   Hypervolemia 02/11/2023   Acute encephalopathy 02/05/2023   Elevated troponin 02/05/2023   Hypokalemia 02/05/2023   Hypomagnesemia 02/05/2023   Intractable nausea and vomiting 02/05/2023   Metabolic alkalosis 02/05/2023   Demand ischemia (HCC) 11/24/2022   Chest pain 11/23/2022   Non-ST elevation (NSTEMI) myocardial infarction (HCC) 11/23/2022   Acute gastrointestinal bleeding 11/23/2022   Chronic combined systolic (congestive) and diastolic (congestive) heart  failure (HCC) 11/23/2022   CKD (chronic kidney disease) stage 3, GFR 30-59 ml/min (HCC) 11/23/2022   DNR (do not resuscitate) 11/23/2022   Anxiety disorder 09/25/2021   Paroxysmal atrial fibrillation (HCC) 02/26/2021   Atrial fibrillation (HCC) 09/09/2018   DOE (dyspnea on  exertion) 08/10/2017   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   Greater trochanteric bursitis of right hip 11/07/2014   Sacro-iliac pain 11/07/2014   Chronic midline low back pain without sciatica 11/07/2013   Hepatitis 04/20/2013   Screening for STD (sexually transmitted disease) 04/20/2013   Recurrent UTI 04/20/2013   Abdominal aortic aneurysm (HCC)    Ulcerative colitis (HCC)    Obstructive sleep apnea    Fibromyalgia    Coronary artery disease    Basal cell carcinoma    Symptomatic anemia 01/11/2013   Ascending aortic aneurysm, 4.1 cm 01/11/2013   Cervical spondylolysis 10/15/2011   Chronic periscapular pain 10/15/2011   Chest wall pain    S/P resection of aortic aneurysm, 1998 prior to CABG    PAD (peripheral artery disease), aortic-iliac bypass 1991, mild carotid bruits    OSA (obstructive sleep apnea)    Chronic fatigue syndrome with fibromyalgia    Hyperlipidemia    Ulcerative colitis (HCC)    Depression    CAD (coronary artery disease), CABG 1998, STENTS MULTIPLE SITES SINCE.     Accelerated hypertension     Past Medical History:  Diagnosis Date   Abdominal aortic aneurysm (HCC)    Anemia, improved 01/11/2013   Basal cell carcinoma    CAD (coronary artery disease)    hx CABG 1998, last cath 2007 with PCI and stenting to the proximal segment of the vein graft to the diagonal branch. DES Taxus was placed   Cervicalgia    Chronic fatigue fibromyalgia syndrome    Chronic pain syndrome    Depression    Fibromyalgia    History of nuclear stress test 08/07/2011   lexiscan ; no evidence of ischemia or infarct; low risk    Hyperlipidemia    Hypertension    OSA (obstructive sleep apnea)    PAD (peripheral artery disease) (HCC)    Pes anserine bursitis    Pulmonary embolism (HCC) 01/11/2013   S/P resection of aortic aneurysm    Trochanteric bursitis    Ulcerative colitis (HCC)     Family History  Problem Relation Age of Onset   Heart failure Mother         at 26   Heart disease Father    Heart attack Father        @ 29   Healthy Sister    Cancer Brother        pancreatic    Cancer Paternal Aunt        BREAST   Heart failure Maternal Grandmother    Heart attack Maternal Grandfather    Heart attack Paternal Grandmother        at 38   Healthy Daughter    Healthy Daughter    Past Surgical History:  Procedure Laterality Date   AORTOBIEXTERNAL ILIAC BYPASS  1991   BIOPSY  11/25/2022   Procedure: BIOPSY;  Surgeon: Alvis Jourdain, MD;  Location: [facility] ENDOSCOPY;  Service: Gastroenterology;;   CATARACT EXTRACTION Left 09/2019   CHOLECYSTECTOMY  1997   COLONOSCOPY WITH PROPOFOL  N/A 11/25/2022   Procedure: COLONOSCOPY WITH PROPOFOL ;  Surgeon: Alvis Jourdain, MD;  Location: [facility] ENDOSCOPY;  Service: Gastroenterology;  Laterality: N/A;   CORONARY ANGIOPLASTY WITH STENT PLACEMENT  2006  to LAD, ATRETIC LIMA AT THAT TIME ALSO   CORONARY ANGIOPLASTY WITH STENT PLACEMENT  2007   STENT TO PROX VG-DIAG, OTHER STENTS PATENT   CORONARY ARTERY BYPASS GRAFT  1998   LIMA-LAD;VG-DIAG & RCA   CORONARY STENT PLACEMENT  2005   TO LAD & LCX-STENTS,   ESOPHAGOGASTRODUODENOSCOPY (EGD) WITH PROPOFOL  N/A 11/25/2022   Procedure: ESOPHAGOGASTRODUODENOSCOPY (EGD) WITH PROPOFOL ;  Surgeon: Alvis Jourdain, MD;  Location: [facility] ENDOSCOPY;  Service: Gastroenterology;  Laterality: N/A;   HERNIA REPAIR     TUBAL LIGATION     BTL   Social History   Social History Narrative   Not on file   Immunization History  Administered Date(s) Administered   Influenza,inj,Quad PF,6+ Mos 04/20/2013, 06/01/2014   PFIZER(Purple Top)SARS-COV-2 Vaccination 08/18/2019, 09/11/2019, 04/27/2020     Objective: Vital Signs: BP (!) 112/57 (BP Location: Left Arm, Patient Position: Sitting, Cuff Size: Normal)   Pulse 84   Resp 15   Ht 5' 3 (1.6 m)   Wt 162 lb (73.5 kg)   BMI 28.70 kg/m    Physical Exam Vitals and nursing note reviewed.  Constitutional:      Appearance: She is  well-developed.  HENT:     Head: Normocephalic and atraumatic.   Eyes:     Conjunctiva/sclera: Conjunctivae normal.    Cardiovascular:     Rate and Rhythm: Normal rate and regular rhythm.     Heart sounds: Murmur heard.  Pulmonary:     Effort: Pulmonary effort is normal.     Breath sounds: Normal breath sounds.  Abdominal:     General: Bowel sounds are normal.     Palpations: Abdomen is soft.   Musculoskeletal:     Cervical back: Normal range of motion.  Lymphadenopathy:     Cervical: No cervical adenopathy.   Skin:    General: Skin is warm and dry.     Capillary Refill: Capillary refill takes less than 2 seconds.   Neurological:     Mental Status: She is alert and oriented to person, place, and time.   Psychiatric:        Behavior: Behavior normal.      Musculoskeletal Exam: C-spine has limited ROM with lateral rotation.  Midline spinal tenderness in the lower lumbar region.  Right sided lower back tenderness.   Right shoulder has slightly limited ROM. Left shoulder has full ROM.  Elbow joints, wrist joints, MCPs, PIPs, and DIPs good ROM with no synovitis.  Complete fist formation bilaterally.  Hip joints have good ROM with no groin pain.  Knee joints have good ROM with no warmth or effusion.  Ankle joints have good ROM with no tenderness or joint swelling.   CDAI Exam: CDAI Score: -- Patient Global: --; Provider Global: -- Swollen: --; Tender: -- Joint Exam 01/22/2024   No joint exam has been documented for this visit   There is currently no information documented on the homunculus. Go to the Rheumatology activity and complete the homunculus joint exam.  Investigation: No additional findings.  Imaging: VAS US  AORTA/IVC/ILIACS Result Date: 01/20/2024 ABDOMINAL AORTA STUDY Patient Name:  CASI WESTERFELD  Date of Exam:   01/20/2024 Medical Rec #: [id number]           Accession #:    [id number] Date of Birth: 22-May-1941          Patient Gender: F Patient Age:   55  years Exam Location:  [address] Procedure:      VAS US  AORTA/IVC/ILIACS Referring  Phys: Angela Kell --------------------------------------------------------------------------------  Risk Factors: Hypertension, hyperlipidemia, coronary artery disease. Vascular Interventions: History of aortobiexternal iliac artery bypass graft                         1991. Status post resection of aortic aneurysm 1998                         prior to CABG.  Performing Technologist: Parke Boll RVS, RCS  Examination Guidelines: A complete evaluation includes B-mode imaging, spectral Doppler, color Doppler, and power Doppler as needed of all accessible portions of each vessel. Bilateral testing is considered an integral part of a complete examination. Limited examinations for reoccurring indications may be performed as noted.  Aorta Graft: Aorta - BiIlliac +--------------------+--------+--------+--------+--------+                     PSV cm/sStenosisWaveformComments +--------------------+--------+--------+--------+--------+ Inflow              434                              +--------------------+--------+--------+--------+--------+ R Prox anastomosis  180                              +--------------------+--------+--------+--------+--------+ R Proximal graft    150                              +--------------------+--------+--------+--------+--------+ R Mid graft         197                              +--------------------+--------+--------+--------+--------+ R Distal graft      99                               +--------------------+--------+--------+--------+--------+                                                      +--------------------+--------+--------+--------+--------+                                                      +--------------------+--------+--------+--------+--------+ Right Outflow       111                               +--------------------+--------+--------+--------+--------+ Left Limb prox anast190                              +--------------------+--------+--------+--------+--------+ Left limb prox      243                              +--------------------+--------+--------+--------+--------+ Left limb mid       182  left limb distal    99                               Left limb outflow   101                              +--------------------+--------+--------+--------+--------+   Summary: Stenosis: +----------+-------------+ Location  Stenosis      +----------+-------------+ Prox Aorta>50% stenosis +----------+-------------+ Patent aortobiiliac bypass graft with inflow stenosis (proximal aorta) velocities suggesting a >50 cm/s stenosis. Distal portions of the graft and distal anastomoses are difficult to visualize due to overlying bowel gas.  *See table(s) above for measurements and observations.  Electronically signed by Angela Kell MD on 01/20/2024 at 1:29:25 PM.    Final    VAS US  ABI WITH/WO TBI Result Date: 01/20/2024  LOWER EXTREMITY DOPPLER STUDY Patient Name:  ALYX MCGUIRK  Date of Exam:   01/20/2024 Medical Rec #: [id number]           Accession #:    [id number] Date of Birth: [birth year]          Patient Gender: F Patient Age:   64 years Exam Location:  [address] Procedure:      VAS US  ABI WITH/WO TBI Referring Phys: Angela Kell --------------------------------------------------------------------------------  Indications: Peripheral artery disease. High Risk Factors: Hypertension, hyperlipidemia, coronary artery disease.  Vascular Interventions: History of aortobiexternal iliac artery bypass graft                         1991. Status post resection of aortic aneurysm 1998                         prior to CABG. Performing Technologist: Parke Boll RVS, RCS  Examination Guidelines: A complete evaluation includes at minimum, Doppler  waveform signals and systolic blood pressure reading at the level of bilateral brachial, anterior tibial, and posterior tibial arteries, when vessel segments are accessible. Bilateral testing is considered an integral part of a complete examination. Photoelectric Plethysmograph (PPG) waveforms and toe systolic pressure readings are included as required and additional duplex testing as needed. Limited examinations for reoccurring indications may be performed as noted.  ABI Findings: +---------+------------------+-----+--------+--------+ Right    Rt Pressure (mmHg)IndexWaveformComment  +---------+------------------+-----+--------+--------+ Brachial 142                                     +---------+------------------+-----+--------+--------+ PTA      90                0.63 biphasic         +---------+------------------+-----+--------+--------+ DP       77                0.54 biphasic         +---------+------------------+-----+--------+--------+ Great Toe72                0.51                  +---------+------------------+-----+--------+--------+ +---------+------------------+-----+--------+-------+ Left     Lt Pressure (mmHg)IndexWaveformComment +---------+------------------+-----+--------+-------+ Brachial 142                                    +---------+------------------+-----+--------+-------+  PTA      80                0.56 biphasic        +---------+------------------+-----+--------+-------+ DP       76                0.54 biphasic        +---------+------------------+-----+--------+-------+ Great Toe66                0.46                 +---------+------------------+-----+--------+-------+ +-------+-----------+-----------+------------+------------+ ABI/TBIToday's ABIToday's TBIPrevious ABIPrevious TBI +-------+-----------+-----------+------------+------------+ Right  0.63       0.51       0.70        0.64          +-------+-----------+-----------+------------+------------+ Left   0.56       0.46       0.74        0.51         +-------+-----------+-----------+------------+------------+  Bilateral ABIs appear decreased compared to prior study on 04/09/2022.  Summary: Right: Resting right ankle-brachial index indicates moderate right lower extremity arterial disease. The right toe-brachial index is abnormal. Left: Resting left ankle-brachial index indicates moderate left lower extremity arterial disease. The left toe-brachial index is abnormal. *See table(s) above for measurements and observations.  Electronically signed by Angela Kell MD on 01/20/2024 at 1:26:40 PM.    Final     Recent Labs: Lab Results  Component Value Date   WBC 6.4 10/27/2023   HGB 12.3 10/27/2023   PLT 206 10/27/2023   NA 142 10/27/2023   K 4.0 10/27/2023   CL 107 10/27/2023   CO2 30 10/27/2023   GLUCOSE 108 (H) 10/27/2023   BUN 27 (H) 10/27/2023   CREATININE 1.46 (H) 10/27/2023   BILITOT 0.7 03/03/2023   ALKPHOS 48 03/03/2023   AST 19 03/03/2023   ALT 13 03/03/2023   PROT 6.2 (L) 03/03/2023   ALBUMIN 3.2 (L) 03/03/2023   CALCIUM  9.5 10/27/2023   GFRAA 73 10/13/2019    Speciality Comments: No specialty comments available.  Procedures:  No procedures performed Allergies: Biotin, Lisinopril , Atenolol, Duloxetine hcl, Lyrica [pregabalin], Penicillin g, Penicillins, and Sulfamethoxazole-trimethoprim   Assessment / Plan:     Visit Diagnoses: Chronic right-sided low back pain with right-sided sciatica: Patient presents today with recurrence of right-sided lower back pain with radiating pain down her leg to her right ankle for the past 1 week.  She has not had any injury or fall prior to the onset of symptoms.  Patient feels that her pain was exacerbated by sleeping on her right side at night.  She has been using a rollator walker to assist with ambulation.  She has been taking Tylenol  as needed for pain relief.  Patient  previously completed physical therapy which was helpful but she has not continued home exercises.  She has also found dry needling and massage to be helpful in the past.  She has scheduled an appointment for dry needling and massage but it is not until the end of July 2025.   Patient was last seen in the office for this concern on 03/17/2023 at which time x-rays were performed which were consistent with multilevel spondylosis and facet joint arthropathy. Midline spinal tenderness noted in the lower lumbar region.  No muscle weakness or numbness.  No bowel or bladder incontinence.  No fevers or chills.  Most of her discomfort is likely related to  facet joint arthropathy.  Discussed that if her symptoms do not improve she will likely require further evaluation with a MRI.  A new referral for physical therapy will be placed today at [facility] as requested.  Patient has declined a cortisone injection or prednisone taper at this time.  She is not a good candidate for corticosteroids due to history of congestive heart failure, atrial fibrillation, and elevated blood pressure.  She plans on taking Tylenol  as needed for symptomatic relief.  She plans on continuing to follow-up with pain management.  She is taking Norco as needed.  Chronic right shoulder pain: Slightly limited range of motion with some stiffness and discomfort.  Lateral epicondylitis, right elbow: Not currently symptomatic.  Primary osteoarthritis of both hands: CMC, PIP, DIP thickening consistent with osteoarthritis of both hands.  No tenderness or synovitis over MCP joints.  Complete fist formation bilaterally.  Discussed the importance of joint protection and muscle strengthening.  Trochanteric bursitis of both hips: Patient presents today with a recurrence of pain over the right trochanteric bursa.  Her symptoms are typically exacerbated by laying on her right side at night.  She declined a cortisone injection at this time.  A new  referral to physical therapy will be placed.  Cervical spondylolysis: C-spine has limited range of motion with lateral rotation.  No symptoms of radiculopathy from the cervical spine noted today.  Sacro-iliac pain: No SI joint tenderness upon palpation currently.  Fibromyalgia: She continues to experience intermittent myalgias and muscle tenderness due to fibromyalgia.  Her myofascial pain is more severe involving her lower extremities.  She is under the care of pain management and has been taking Norco for pain relief.  Primary insomnia: She has interrupted sleep at night which she attributes to nocturnal pain.  She takes trazodone  50 mg 1 tablet at bedtime for insomnia.  Other medical conditions are listed as follows:  History of claudication  Coronary artery disease status post CABG  History of depression  History of hypertension: Blood pressure is 112/57 today in the office.  Paroxysmal atrial fibrillation (HCC)  History of ulcerative colitis  History of hyperlipidemia  OSA (obstructive sleep apnea)  Orders: No orders of the defined types were placed in this encounter.  No orders of the defined types were placed in this encounter.    Follow-Up Instructions: Return in about 6 months (around 07/23/2024).   Romayne Clubs, PA-C  Note - This record has been created using Dragon software.  Chart creation errors have been sought, but may not always  have been located. Such creation errors do not reflect on  the standard of medical care.

## 2024-01-22 ENCOUNTER — Other Ambulatory Visit: Payer: Self-pay

## 2024-01-22 ENCOUNTER — Ambulatory Visit: Attending: Physician Assistant | Admitting: Physician Assistant

## 2024-01-22 ENCOUNTER — Encounter: Payer: Self-pay | Admitting: Physician Assistant

## 2024-01-22 VITALS — BP 112/57 | HR 84 | Resp 15 | Ht 63.0 in | Wt 162.0 lb

## 2024-01-22 DIAGNOSIS — Z8659 Personal history of other mental and behavioral disorders: Secondary | ICD-10-CM | POA: Diagnosis not present

## 2024-01-22 DIAGNOSIS — I480 Paroxysmal atrial fibrillation: Secondary | ICD-10-CM

## 2024-01-22 DIAGNOSIS — G8929 Other chronic pain: Secondary | ICD-10-CM

## 2024-01-22 DIAGNOSIS — M7062 Trochanteric bursitis, left hip: Secondary | ICD-10-CM

## 2024-01-22 DIAGNOSIS — I2510 Atherosclerotic heart disease of native coronary artery without angina pectoris: Secondary | ICD-10-CM

## 2024-01-22 DIAGNOSIS — M5441 Lumbago with sciatica, right side: Secondary | ICD-10-CM

## 2024-01-22 DIAGNOSIS — Z8719 Personal history of other diseases of the digestive system: Secondary | ICD-10-CM

## 2024-01-22 DIAGNOSIS — M25511 Pain in right shoulder: Secondary | ICD-10-CM

## 2024-01-22 DIAGNOSIS — Z8679 Personal history of other diseases of the circulatory system: Secondary | ICD-10-CM | POA: Diagnosis not present

## 2024-01-22 DIAGNOSIS — F5101 Primary insomnia: Secondary | ICD-10-CM | POA: Diagnosis not present

## 2024-01-22 DIAGNOSIS — M19041 Primary osteoarthritis, right hand: Secondary | ICD-10-CM | POA: Diagnosis not present

## 2024-01-22 DIAGNOSIS — M797 Fibromyalgia: Secondary | ICD-10-CM | POA: Diagnosis not present

## 2024-01-22 DIAGNOSIS — M533 Sacrococcygeal disorders, not elsewhere classified: Secondary | ICD-10-CM

## 2024-01-22 DIAGNOSIS — M7711 Lateral epicondylitis, right elbow: Secondary | ICD-10-CM

## 2024-01-22 DIAGNOSIS — M19042 Primary osteoarthritis, left hand: Secondary | ICD-10-CM

## 2024-01-22 DIAGNOSIS — G4733 Obstructive sleep apnea (adult) (pediatric): Secondary | ICD-10-CM

## 2024-01-22 DIAGNOSIS — M7061 Trochanteric bursitis, right hip: Secondary | ICD-10-CM | POA: Diagnosis not present

## 2024-01-22 DIAGNOSIS — M4302 Spondylolysis, cervical region: Secondary | ICD-10-CM | POA: Diagnosis not present

## 2024-01-22 DIAGNOSIS — Z8639 Personal history of other endocrine, nutritional and metabolic disease: Secondary | ICD-10-CM

## 2024-01-22 NOTE — Progress Notes (Signed)
 Referral sent per Jacinta Martinis

## 2024-01-27 DIAGNOSIS — R399 Unspecified symptoms and signs involving the genitourinary system: Secondary | ICD-10-CM | POA: Diagnosis not present

## 2024-01-27 DIAGNOSIS — N390 Urinary tract infection, site not specified: Secondary | ICD-10-CM | POA: Diagnosis not present

## 2024-01-27 DIAGNOSIS — H3563 Retinal hemorrhage, bilateral: Secondary | ICD-10-CM | POA: Diagnosis not present

## 2024-02-11 ENCOUNTER — Other Ambulatory Visit: Payer: Self-pay | Admitting: Registered Nurse

## 2024-02-11 DIAGNOSIS — H903 Sensorineural hearing loss, bilateral: Secondary | ICD-10-CM | POA: Diagnosis not present

## 2024-02-17 DIAGNOSIS — H02052 Trichiasis without entropian right lower eyelid: Secondary | ICD-10-CM | POA: Diagnosis not present

## 2024-02-22 ENCOUNTER — Encounter: Payer: Self-pay | Admitting: Registered Nurse

## 2024-02-22 ENCOUNTER — Encounter: Attending: Registered Nurse | Admitting: Registered Nurse

## 2024-02-22 ENCOUNTER — Other Ambulatory Visit: Payer: Self-pay | Admitting: General Practice

## 2024-02-22 VITALS — BP 122/73 | HR 74 | Ht 63.0 in | Wt 163.0 lb

## 2024-02-22 DIAGNOSIS — M546 Pain in thoracic spine: Secondary | ICD-10-CM | POA: Diagnosis not present

## 2024-02-22 DIAGNOSIS — Z79891 Long term (current) use of opiate analgesic: Secondary | ICD-10-CM | POA: Insufficient documentation

## 2024-02-22 DIAGNOSIS — M545 Low back pain, unspecified: Secondary | ICD-10-CM | POA: Insufficient documentation

## 2024-02-22 DIAGNOSIS — G8929 Other chronic pain: Secondary | ICD-10-CM | POA: Insufficient documentation

## 2024-02-22 DIAGNOSIS — M778 Other enthesopathies, not elsewhere classified: Secondary | ICD-10-CM | POA: Insufficient documentation

## 2024-02-22 DIAGNOSIS — M797 Fibromyalgia: Secondary | ICD-10-CM | POA: Diagnosis not present

## 2024-02-22 DIAGNOSIS — Z5181 Encounter for therapeutic drug level monitoring: Secondary | ICD-10-CM | POA: Diagnosis not present

## 2024-02-22 DIAGNOSIS — G894 Chronic pain syndrome: Secondary | ICD-10-CM | POA: Diagnosis not present

## 2024-02-22 MED ORDER — HYDROCODONE-ACETAMINOPHEN 5-325 MG PO TABS: 1.0000 | ORAL_TABLET | Freq: Four times a day (QID) | ORAL | 0 refills | Status: DC | PRN

## 2024-02-22 NOTE — Telephone Encounter (Signed)
 Prescription refill request for Eliquis  received. Indication:AFIB Last office visit:1/25 Scr:1.16  3/25 Age: 83 Weight:73.5  kg  Prescription refilled

## 2024-02-22 NOTE — Progress Notes (Signed)
 Subjective:    Patient ID: Kara Bush, female    DOB: 1941/03/16, 83 y.o.   MRN: [id number]  HPI: Kara Bush is a 83 y.o. female who returns for follow up appointment for chronic pain and medication refill. She states her pain is located in her right shoulder, mid- lower back and generalized joint pain. She rates her pain 4. Her current exercise regime is walking and performing stretching exercises.  Ms. Sukhu Morphine  equivalent is 25.00 MME.   Last Oral Swab was Performed on 05/23/2-25, it was consistent.     Pain Inventory Average Pain 5 Pain Right Now 4 My pain is constant and aching  In the last 24 hours, has pain interfered with the following? General activity 3 Relation with others 0 Enjoyment of life 1 What TIME of day is your pain at its worst? morning  Sleep (in general) Good  Pain is worse with: walking, bending, and some activites Pain improves with: pacing activities and medication Relief from Meds: 5  Family History  Problem Relation Age of Onset   Heart failure Mother        at 30   Heart disease Father    Heart attack Father        @ 72   Healthy Sister    Cancer Brother        pancreatic    Cancer Paternal Aunt        BREAST   Heart failure Maternal Grandmother    Heart attack Maternal Grandfather    Heart attack Paternal Grandmother        at 30   Healthy Daughter    Healthy Daughter    Social History   Socioeconomic History   Marital status: Divorced    Spouse name: Not on file   Number of children: 2   Years of education: college    Highest education level: Not on file  Occupational History    Employer: RETIRED  Tobacco Use   Smoking status: Former    Current packs/day: 0.00    Average packs/day: 2.5 packs/day for 20.0 years (50.0 ttl pk-yrs)    Types: Cigarettes    Start date: 06/29/1970    Quit date: 06/29/1990    Years since quitting: 33.6    Passive exposure: Never   Smokeless tobacco: Never  Vaping Use    Vaping status: Never Used  Substance and Sexual Activity   Alcohol  use: No   Drug use: No   Sexual activity: Not on file  Other Topics Concern   Not on file  Social History Narrative   Not on file   Social Drivers of Health   Financial Resource Strain: Not on file  Food Insecurity: No Food Insecurity (09/04/2023)   Hunger Vital Sign    Worried About Running Out of Food in the Last Year: Never true    Ran Out of Food in the Last Year: Never true  Transportation Needs: No Transportation Needs (09/04/2023)   PRAPARE - Administrator, Civil Service (Medical): No    Lack of Transportation (Non-Medical): No  Physical Activity: Not on file  Stress: Not on file  Social Connections: Socially Isolated (09/04/2023)   Social Connection and Isolation Panel    Frequency of Communication with Friends and Family: More than three times a week    Frequency of Social Gatherings with Friends and Family: Three times a week    Attends Religious Services: Never    Active Member of  Clubs or Organizations: No    Attends Banker Meetings: Never    Marital Status: Divorced   Past Surgical History:  Procedure Laterality Date   AORTOBIEXTERNAL ILIAC BYPASS  1991   BIOPSY  11/25/2022   Procedure: BIOPSY;  Surgeon: Rollin Dover, MD;  Location: [facility] ENDOSCOPY;  Service: Gastroenterology;;   CATARACT EXTRACTION Left 09/2019   CHOLECYSTECTOMY  1997   COLONOSCOPY WITH PROPOFOL  N/A 11/25/2022   Procedure: COLONOSCOPY WITH PROPOFOL ;  Surgeon: Rollin Dover, MD;  Location: [facility] ENDOSCOPY;  Service: Gastroenterology;  Laterality: N/A;   CORONARY ANGIOPLASTY WITH STENT PLACEMENT  2006   to LAD, ATRETIC LIMA AT THAT TIME ALSO   CORONARY ANGIOPLASTY WITH STENT PLACEMENT  2007   STENT TO PROX VG-DIAG, OTHER STENTS PATENT   CORONARY ARTERY BYPASS GRAFT  1998   LIMA-LAD;VG-DIAG & RCA   CORONARY STENT PLACEMENT  2005   TO LAD & LCX-STENTS,   ESOPHAGOGASTRODUODENOSCOPY (EGD) WITH PROPOFOL  N/A  11/25/2022   Procedure: ESOPHAGOGASTRODUODENOSCOPY (EGD) WITH PROPOFOL ;  Surgeon: Rollin Dover, MD;  Location: [facility] ENDOSCOPY;  Service: Gastroenterology;  Laterality: N/A;   HERNIA REPAIR     TUBAL LIGATION     BTL   Past Surgical History:  Procedure Laterality Date   AORTOBIEXTERNAL ILIAC BYPASS  1991   BIOPSY  11/25/2022   Procedure: BIOPSY;  Surgeon: Rollin Dover, MD;  Location: [facility] ENDOSCOPY;  Service: Gastroenterology;;   CATARACT EXTRACTION Left 09/2019   CHOLECYSTECTOMY  1997   COLONOSCOPY WITH PROPOFOL  N/A 11/25/2022   Procedure: COLONOSCOPY WITH PROPOFOL ;  Surgeon: Rollin Dover, MD;  Location: [facility] ENDOSCOPY;  Service: Gastroenterology;  Laterality: N/A;   CORONARY ANGIOPLASTY WITH STENT PLACEMENT  2006   to LAD, ATRETIC LIMA AT THAT TIME ALSO   CORONARY ANGIOPLASTY WITH STENT PLACEMENT  2007   STENT TO PROX VG-DIAG, OTHER STENTS PATENT   CORONARY ARTERY BYPASS GRAFT  1998   LIMA-LAD;VG-DIAG & RCA   CORONARY STENT PLACEMENT  2005   TO LAD & LCX-STENTS,   ESOPHAGOGASTRODUODENOSCOPY (EGD) WITH PROPOFOL  N/A 11/25/2022   Procedure: ESOPHAGOGASTRODUODENOSCOPY (EGD) WITH PROPOFOL ;  Surgeon: Rollin Dover, MD;  Location: [facility] ENDOSCOPY;  Service: Gastroenterology;  Laterality: N/A;   HERNIA REPAIR     TUBAL LIGATION     BTL   Past Medical History:  Diagnosis Date   Abdominal aortic aneurysm (HCC)    Anemia, improved 01/11/2013   Basal cell carcinoma    CAD (coronary artery disease)    hx CABG 1998, last cath 2007 with PCI and stenting to the proximal segment of the vein graft to the diagonal branch. DES Taxus was placed   Cervicalgia    Chronic fatigue fibromyalgia syndrome    Chronic pain syndrome    Depression    Fibromyalgia    History of nuclear stress test 08/07/2011   lexiscan ; no evidence of ischemia or infarct; low risk    Hyperlipidemia    Hypertension    OSA (obstructive sleep apnea)    PAD (peripheral artery disease) (HCC)    Pes anserine bursitis    Pulmonary  embolism (HCC) 01/11/2013   S/P resection of aortic aneurysm    Trochanteric bursitis    Ulcerative colitis (HCC)    BP 122/73   Pulse 74   Ht 5' 3 (1.6 m)   Wt 163 lb (73.9 kg)   SpO2 93%   BMI 28.87 kg/m   Opioid Risk Score:   Fall Risk Score:  `1  Depression screen [facility] 2/9  02/22/2024    1:39 PM 12/25/2023    1:39 PM 10/22/2023   10:20 AM 08/20/2023    1:35 PM 04/22/2023    2:28 PM 02/25/2023    1:31 PM 10/17/2022    1:38 PM  Depression screen PHQ 2/9  Decreased Interest 0 0 0 0 0 0 0  Down, Depressed, Hopeless 0 0 0 0 0 0 0  PHQ - 2 Score 0 0 0 0 0 0 0     Review of Systems  Musculoskeletal:  Positive for back pain.  All other systems reviewed and are negative.      Objective:   Physical Exam Vitals and nursing note reviewed.  Constitutional:      Appearance: Normal appearance.  Cardiovascular:     Rate and Rhythm: Normal rate and regular rhythm.     Pulses: Normal pulses.     Heart sounds: Normal heart sounds.  Pulmonary:     Effort: Pulmonary effort is normal.     Breath sounds: Normal breath sounds.  Musculoskeletal:     Comments: Normal Muscle Bulk and Muscle Testing Reveals:  Upper Extremities: Right: Decreased ROM 90 Degrees and Muscle Strength 5/5 Right: AC Joint Tenderness Left Upper Extremity: Full ROM and Muscle Strength 5/5 Thoracic Paraspinal Tenderness: T-4-T-6 Mainly Right Side  Lumbar Paraspinal Tenderness: L-4-L-5 Lower Extremities: Full ROM and Muscle Strength 5/5 Arises from Chair slowly Narrow Based  Gait     Skin:    General: Skin is warm and dry.  Neurological:     Mental Status: She is alert and oriented to person, place, and time.  Psychiatric:        Mood and Affect: Mood normal.        Behavior: Behavior normal.          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 02/22/2024 2. Fibromyalgia:  Continue  Home Exercise Program. Continue to Monitor.02/22/2024 3. Bilateral intermittent hand numbness: Carpal tunnel syndrome versus C6 radiculopathy mild: No complaints today. Continue to Monitor. 02/22/2024 4. Chronic Midline Low Back Pain/ LBP: Refilled: Norco 5/325mg  # 140 pills--use one pill every 6 hours as needed for pain. A second prescription was e-scribe for the following month 02/22/2024 We will continue the opioid monitoring program, this consists of regular clinic visits, examinations, urine drug screen, pill counts as well as use of [place]  Controlled Substance Reporting system. A 12 month History has been reviewed on the [place]  Controlled Substance Reporting System on 02/22/2024. 5. Muscle Spasm: Continue current medication regimen with  Robaxin . 02/22/2024 6.Right  Greater Trochanteric Bursitis:Continue to Alternate with heat and ice therapy. Continue current medication regime. 11/14/202 7. Left Foot pain: No complaints today.Continue with HEP as Tolerated. Continue to Monitor. 02/22/2024 8. Right Lateral Epicondylitis Pain: No complaints Today. Continue HEP as Tolerated. Continue to Monitor. 02/22/2024 9. Right Shoulder Tendonitis: Continue Current Medication Regimen. Continue to Alternate Ice and Heat Therapy. 02/22/2024 10 Right Chronic Knee Pain: .No complaints today. Continue HEP as Tolerated. Continue to Monitor. 02/22/2024   F/U in 2 month

## 2024-02-24 ENCOUNTER — Ambulatory Visit: Attending: Cardiovascular Disease | Admitting: Internal Medicine

## 2024-02-24 ENCOUNTER — Encounter: Payer: Self-pay | Admitting: Internal Medicine

## 2024-02-24 VITALS — BP 104/46 | HR 68 | Ht 63.0 in | Wt 164.0 lb

## 2024-02-24 DIAGNOSIS — I10 Essential (primary) hypertension: Secondary | ICD-10-CM

## 2024-02-24 DIAGNOSIS — E785 Hyperlipidemia, unspecified: Secondary | ICD-10-CM | POA: Diagnosis not present

## 2024-02-24 DIAGNOSIS — I350 Nonrheumatic aortic (valve) stenosis: Secondary | ICD-10-CM | POA: Diagnosis not present

## 2024-02-24 DIAGNOSIS — I25119 Atherosclerotic heart disease of native coronary artery with unspecified angina pectoris: Secondary | ICD-10-CM

## 2024-02-24 MED ORDER — EZETIMIBE 10 MG PO TABS: 10.0000 mg | ORAL_TABLET | Freq: Every day | ORAL | 3 refills | Status: AC

## 2024-02-24 NOTE — Patient Instructions (Signed)
 Medication Instructions:  Your physician has recommended you make the following change in your medication:  1) START ezetimibe  (Zetia ) 10 mg daily  *If you need a refill on your cardiac medications before your next appointment, please call your pharmacy*  Lab Work: In 3-4 months: fasting Lipid panel  You may go to any of these [facility] locations: [facility] - [address] ([facility]) - [address] [phone] [address] (1st floor, next to pharmacy)   [place] - [address]    [place]  - [address]    [place] - [address] [employer]  - [address] [place] [address] ([facility])  [place]   - [address], [address]  If you have labs (blood work) drawn today and your tests are completely normal, you will receive your results only by: [employer] (if you have MyChart) OR A paper copy in the mail If you have any lab test that is abnormal or we need to change your treatment, we will call you to review the results.  Testing/Procedures: Your physician has requested that you have an echocardiogram. Echocardiography is a painless test that uses sound waves to create images of your heart. It provides your doctor with information about the size and shape of your heart and how well your heart's chambers and valves are working. This procedure takes approximately one hour. There are no restrictions for this procedure. Please do NOT wear cologne, perfume, aftershave, or lotions (deodorant is allowed). Please arrive 15 minutes prior to your appointment time.  Please note: We ask at that you not bring children with you during ultrasound (echo/ vascular) testing. Due to room size and safety concerns, children are not allowed in the ultrasound rooms during exams. Our front office staff cannot provide  observation of children in our lobby area while testing is being conducted. An adult accompanying a patient to their appointment will only be allowed in the ultrasound room at the discretion of the ultrasound technician under special circumstances. We apologize for any inconvenience.  Follow-Up: At [facility], you and your health needs are our priority.  As part of our continuing mission to provide you with exceptional heart care, our providers are all part of one team.  This team includes your primary Cardiologist (physician) and Advanced Practice Providers or APPs (Physician Assistants and Nurse Practitioners) who all work together to provide you with the care you need, when you need it.  Your next appointment:   6 month(s)  Provider:   Katlyn West, NP  We recommend signing up for the patient portal called MyChart.  Sign up information is provided on this After Visit Summary.  MyChart is used to connect with patients for Virtual Visits (Telemedicine).  Patients are able to view lab/test results, encounter notes, upcoming appointments, etc.  Non-urgent messages can be sent to your provider as well.   To learn more about what you can do with MyChart, go to [email or web address].

## 2024-02-24 NOTE — Progress Notes (Signed)
 02/24/2024   PCP: Dayna Motto, DO  CC: No complaints  HPI: 83 year old white female with a history of coronary artery disease as well as peripheral vascular disease. She had bypass grafting in 1998 and stents in 2005 2006 and 2000 710 negative nuclear stress test in 2013 she also has peripheral arterial disease and history of aortic iliac bypass in 1991. Last ABIs were made of 2013 were normal.  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  Fortunately she was asymptomatic, she was placed on anticoagulation with the Xarelto .   She has done well since that time.  Her last visit with Dr. Mona was in February and he was planning to recheck d-dimer and if it was still elevated she would need to continue her Xarelto  for total of 6 months.  Her d-dimer continues to be elevated at 0.67. Initial d-dimer in February was 1.48.  Venous Dopplers were also done in February which were negative for DVT.    Kara Bush just had a repeat D. dimer which was elevated at 1.48, actually slightly higher than it had been before. She tells a story that over the past several weeks she's had a urinary tract infection and started taking Bactrim for which she is more nauseated and is actually thrown up. She reports some tenderness across her epigastric area as well as the costovertebral angles. She's also had fever, chills and some sweating at night. She is planning on seeing her primary care doctor back in the office this afternoon. She denies any worsening shortness of breath or chest pain. He's had no bleeding complications on Xarelto .  Kara Bush returns today for follow-up. She recently reports some shortness of breath with exertion. She occasionally gets twinges in her chest however it is not reminiscent of her prior angina. Nevertheless her last stress test was 2 years ago. I think a lot of her shortness of  breath is due to weight gain and minimal exercise. She does water exercise but not water aerobics.  I saw Kara Bush today in follow-up of her stress test and lower extremity Dopplers. Unfortunately she did not get the lower extremity Dopplers. She did undergo a metabolic stress testing. This showed no circulatory limitation to exercise. The main factors in her shortness of breath seems to be obesity and some pulmonary restriction. Currently she denies any significant leg claudication.  07/03/2106  Kara Bush returns today for follow-up. Overall she's feeling fairly well. She had lower chimney arterial Dopplers a year ago which showed normal ABIs. She gets some occasional pain, burning and itching in her extremities which may be related to fibromyalgia. She denies any worsening chest pain or shortness of breath with exertion.  08/10/2017  Kara Bush was seen today in follow-up.  She says she is fairly stable with regards to her symptoms compared to last year.  She gets short of breath, when doing moderate to heavy exertion.  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  She had slight worsening of her right lower extremity ABI last year but denies any worsening claudication with exertion.  Blood pressures been well controlled at home.  She has a physical with routine blood work in 3 months.  08/17/2018  Kara Bush returns today for routine follow-up.  Is been a year since I last saw her.  She had an echocardiogram which showed a stable dilated aortic root.  We had planned to repeat CT angiography of the aorta around this time.  She also had had lower extremity Dopplers but denies any worsening claudication.  This showed mild bilateral lower extremity arterial disease.  She reports continued exercise and does do water aerobics.  She struggles with fibromyalgia and is on long-term opiate therapy.  Labs from March 2019 showed total  cholesterol 141, HDL 50, LDL 64 and triglycerides 134.  09/09/2018  Kara Bush is seen today as an add-on for new A. fib.  I just saw her a couple weeks ago.  At the time she was in sinus rhythm and doing well.  Today she was in for Dopplers and was noted to be in an irregular rhythm.  EKG was performed which shows newly identified atrial fibrillation with controlled ventricular response at 81.  Subsequently she reported that she has been having some episodes of palpitations on and off however it was not clear whether this could have been related to her fibromyalgia or any other disorder.  She is also had a few episodes of presyncope.  06/23/2019  Kara Bush returns today for follow-up of A. fib.  Overall she is feeling well.  Her EKG today shows in sinus rhythm.  She has been tolerating Xarelto  however the cost is concerning for her.  She is interested in something else.  We discussed possibly switching over to Eliquis .  Recently she held the Xarelto  prior to colonoscopy which was uneventful and then it was restarted.  She is also noted elevated blood pressures recently.  She is only on low-dose lisinopril .  08/17/2019  Kara Bush is seen today in follow-up.  I last saw her about a couple months ago.  She returns today for follow-up of blood pressure.  I previously increased her lisinopril  however she remains hypertensive.  She is done well after transitioning from Xarelto  to Eliquis .  She is currently only on low-dose lisinopril  10 mg daily.  04/06/2020  Kara Bush returns today for follow-up.  Overall she is doing well without any specific complaints.  LDL was 63 as of March 2011.  Blood pressure today is 140/72.  She is demonstrating sinus rhythm with first-degree AV block and some PACs and PVCs on her EKG but no evidence of A. fib.  She is on Eliquis  and denies any bleeding difficulty.  She does get some occasional breakthrough palpitations.  She is only on low-dose  metoprolol .  04/02/2022  Kara Bush returns today for follow-up.  Overall she says she is doing well.  She denies any chest pain but does note she gets short of breath with minimal exertion.  It sounds like she is fairly sedentary.  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  She will have these annually.  She reports on her Apple Watch that she has paroxysmal atrial fibrillation.  I did review this and showed some episodes of A-fib although her EKG today shows sinus rhythm with PACs.  She brought a blood pressure log with her which shows relatively good control of her blood pressures generally around the 130s over 70s.  Blood pressure today was 130/76.  She has not had follow-up lower extremity arterial Dopplers and does have a history of aorto ileal bypass in the past.  She was sent to vascular surgery however they did not follow-up with her recently.  09/25/2022  Kara Bush returns today for follow-up.  She just had a repeat echo which shows normal LVEF 60 to 65%, mild LVH and mild LAE, mild to moderate MR, severe TR and stable mild to moderate AS with a mean gradient of 16.8 mmHg.  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  Blood pressure appears better controlled than it has in the past.  EKG today shows a normal sinus rhythm.  She denies any recurrent A-fib.  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  She does have an intact right kidney.  She has had some recent rectal bleeding was seen yesterday by Dr. Rollin and found to have an anal fissure which she is undergoing treatment for.  She appears somewhat pale today but says that she has not been anemic.  Hemoglobin in the fall was 12 g/dL.   06/12/2023  Kara Bush returns today for follow-up.  Recently she has had some dizzy episodes.  She brought a number of blood  pressure readings in with her.  It does show some lability.  She is lost 20 pounds recently and her blood pressure today was 90/40.  This was repeated.  She did take her Lasix  today.  She is on hydralazine  in addition to Entresto .  Recently I backed off on her spironolactone  from 25 to 12.5 mg daily.  He is requesting handicap permit.  She uses an assistive device to walk and with her heart failure would likely qualify for permanent parking placard.  02/24/2024  Kara Bush returns today for follow-up.  Overall she seems to be doing well.  When she was last seen in January she had an issue with hypotension and fatigue and was found to have significant anemia.  Her hemoglobin however has recovered since then.  Blood pressure was low today 104/46 but she is not symptomatic with that.  She said today was a Lasix  day and she feels that that may have been responsible for it.  She has not had recent lipid testing however her last LDL cholesterol was 91 in May 2025.  Her target LDL is less than 70.  She did have recent lower extremity Dopplers by Dr. Sheree who is monitoring this.  She is due for repeat imaging of her aortic valve which showed some moderate aortic stenosis and mild aortic insufficiency last year.    Allergies  Allergen Reactions   Biotin Hives   Lisinopril  Cough   Atenolol Other (See Comments)    colitis Other reaction(s): Bowel issues   Duloxetine Hcl Other (See Comments)    Mad pt feel spacey Other reaction(s): shaky; tired in the PMs   Lyrica [Pregabalin] Other (See Comments)    Made pt feel spacey   Penicillin G Rash    Other reaction(s): rash   Penicillins Rash   Sulfamethoxazole-Trimethoprim Nausea And Vomiting    Other reaction(s): elevated liver functions    Current Outpatient Medications  Medication Sig Dispense Refill   acetaminophen  (TYLENOL ) 500 MG tablet Take 500 mg by mouth as needed for moderate pain (pain score 4-6).     atorvastatin  (LIPITOR) 40 MG tablet TAKE 1  TABLET BY MOUTH EVERY DAY 90 tablet 2   citalopram  (CELEXA ) 20 MG tablet Take 1 tablet (20 mg total) by mouth daily. 30 tablet 0   Cyanocobalamin  (VITAMIN B 12 PO) VITAMIN B     ELIQUIS  5 MG TABS tablet TAKE 1 TABLET BY MOUTH  TWICE A DAY 180 tablet 1   furosemide  (LASIX ) 40 MG tablet Take 1 tablet (40 mg total) by mouth every Monday, Wednesday, and Friday. 36 tablet 2   HYDROcodone -acetaminophen  (NORCO/VICODIN) 5-325 MG tablet Take 1 tablet by mouth 4 (four) times daily as needed for moderate pain (pain score 4-6). May Take an extra 0.5 to one tablet when pain is severe. 140 tablet 0   Iron , Ferrous Sulfate , 325 (65 Fe) MG TABS Take 325 mg by mouth 2 (two) times daily. 60 tablet 2   isosorbide  mononitrate (IMDUR ) 30 MG 24 hr tablet TAKE 1 TABLET BY MOUTH EVERY DAY 90 tablet 1   methocarbamol  (ROBAXIN ) 500 MG tablet TAKE 1 TABLET BY MOUTH EVERY 8 HOURS AS NEEDED FOR MUSCLE SPASMS 30 tablet 1   metoprolol  succinate (TOPROL -XL) 25 MG 24 hr tablet TAKE 3 TABLETS (75 MG TOTAL) BY MOUTH DAILY. TAKE WITH OR IMMEDIATELY FOLLOWING A MEAL. 270 tablet 2   MULTIPLE VITAMIN PO      pantoprazole  (PROTONIX ) 40 MG tablet Take 1 tablet (40 mg total) by mouth daily.     potassium chloride  (KLOR-CON  M) 10 MEQ tablet Take 1 tablet (10 mEq total) by mouth every Monday, Wednesday, and Friday. 10 tablet 0   potassium chloride  (KLOR-CON ) 10 MEQ tablet Take 1 tablet (10 mEq total) by mouth 3 (three) times a week. WITH LAIX AS NEEDED 36 tablet 2   sacubitril -valsartan  (ENTRESTO ) 24-26 MG Take 1 tablet by mouth 2 (two) times daily. 180 tablet 3   spironolactone  (ALDACTONE ) 25 MG tablet TAKE 1 TABLET (25 MG TOTAL) BY MOUTH DAILY. 90 tablet 1   traZODone  (DESYREL ) 50 MG tablet Take 1 tablet (50 mg total) by mouth at bedtime as needed for sleep. 30 tablet 0   No current facility-administered medications for this visit.    Past Medical History:  Diagnosis Date   Abdominal aortic aneurysm (HCC)    Anemia, improved  01/11/2013   Basal cell carcinoma    CAD (coronary artery disease)    hx CABG 1998, last cath 2007 with PCI and stenting to the proximal segment of the vein graft to the diagonal branch. DES Taxus was placed   Cervicalgia    Chronic fatigue fibromyalgia syndrome    Chronic pain syndrome    Depression    Fibromyalgia    History of nuclear stress test 08/07/2011   lexiscan ; no evidence of ischemia or infarct; low risk    Hyperlipidemia    Hypertension    OSA (obstructive sleep apnea)    PAD (peripheral artery disease) (HCC)    Pes anserine bursitis    Pulmonary embolism (HCC) 01/11/2013   S/P resection of aortic aneurysm    Trochanteric bursitis    Ulcerative colitis [facility])     Past Surgical History:  Procedure Laterality Date   AORTOBIEXTERNAL ILIAC BYPASS  1991   BIOPSY  11/25/2022   Procedure: BIOPSY;  Surgeon: Rollin Dover, MD;  Location: [facility] ENDOSCOPY;  Service: Gastroenterology;;   CATARACT EXTRACTION Left 09/2019   CHOLECYSTECTOMY  1997   COLONOSCOPY WITH PROPOFOL  N/A 11/25/2022   Procedure: COLONOSCOPY WITH PROPOFOL ;  Surgeon: Rollin Dover, MD;  Location: [facility] ENDOSCOPY;  Service: Gastroenterology;  Laterality: N/A;   CORONARY ANGIOPLASTY WITH STENT PLACEMENT  2006   to LAD, ATRETIC LIMA AT THAT TIME ALSO   CORONARY ANGIOPLASTY WITH STENT PLACEMENT  2007   STENT TO PROX VG-DIAG, OTHER STENTS PATENT   CORONARY ARTERY BYPASS GRAFT  1998   LIMA-LAD;VG-DIAG & RCA  CORONARY STENT PLACEMENT  2005   TO LAD & LCX-STENTS,   ESOPHAGOGASTRODUODENOSCOPY (EGD) WITH PROPOFOL  N/A 11/25/2022   Procedure: ESOPHAGOGASTRODUODENOSCOPY (EGD) WITH PROPOFOL ;  Surgeon: Rollin Dover, MD;  Location: [facility] ENDOSCOPY;  Service: Gastroenterology;  Laterality: N/A;   HERNIA REPAIR     TUBAL LIGATION     BTL   EKG:      ROS: Pertinent items noted in HPI and remainder of comprehensive ROS otherwise negative.  PHYSICAL EXAM BP (!) 104/46   Pulse 68   Ht 5' 3 (1.6 m)   Wt 164 lb (74.4 kg)    SpO2 94%   BMI 29.05 kg/m  General appearance: alert, no distress, and moderately obese Neck: no carotid bruit and no JVD Lungs: clear to auscultation bilaterally Heart: regular rate and rhythm and systolic murmur: systolic ejection 3/6, crescendo at 2nd right intercostal space Abdomen: soft, non-tender; bowel sounds normal; no masses,  no organomegaly Extremities: extremities normal, atraumatic, no cyanosis or edema Pulses: 2+ and symmetric Skin: Pale, cool, dry Neurologic: Grossly normal Psych: Pleasant  ASSESSMENT AND PLAN Patient Active Problem List   Diagnosis Date Noted   Iron  deficiency anemia 09/07/2023   Acute on chronic systolic CHF (congestive heart failure) (HCC) 03/03/2023   Acute on chronic diastolic congestive heart failure (HCC) 03/02/2023   Pulmonary embolism with infarction (HCC) 03/02/2023   Chronic heart failure with preserved ejection fraction (HFpEF) (HCC) 02/11/2023   Hypervolemia 02/11/2023   Acute encephalopathy 02/05/2023   Elevated troponin 02/05/2023   Hypokalemia 02/05/2023   Hypomagnesemia 02/05/2023   Intractable nausea and vomiting 02/05/2023   Metabolic alkalosis 02/05/2023   Demand ischemia (HCC) 11/24/2022   Chest pain 11/23/2022   Non-ST elevation (NSTEMI) myocardial infarction (HCC) 11/23/2022   Acute gastrointestinal bleeding 11/23/2022   Chronic combined systolic (congestive) and diastolic (congestive) heart failure (HCC) 11/23/2022   CKD (chronic kidney disease) stage 3, GFR 30-59 ml/min (HCC) 11/23/2022   DNR (do not resuscitate) 11/23/2022   Anxiety disorder 09/25/2021   Paroxysmal atrial fibrillation (HCC) 02/26/2021   Atrial fibrillation (HCC) 09/09/2018   DOE (dyspnea on exertion) 08/10/2017   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   Greater trochanteric bursitis of right hip 11/07/2014   Sacro-iliac pain 11/07/2014   Chronic midline low back pain without sciatica 11/07/2013   Hepatitis 04/20/2013   Screening for  STD (sexually transmitted disease) 04/20/2013   Recurrent UTI 04/20/2013   Abdominal aortic aneurysm (HCC)    Ulcerative colitis (HCC)    Obstructive sleep apnea    Fibromyalgia    Coronary artery disease    Basal cell carcinoma    Symptomatic anemia 01/11/2013   Ascending aortic aneurysm, 4.1 cm 01/11/2013   Cervical spondylolysis 10/15/2011   Chronic periscapular pain 10/15/2011   Chest wall pain    S/P resection of aortic aneurysm, 1998 prior to CABG    PAD (peripheral artery disease), aortic-iliac bypass 1991, mild carotid bruits    OSA (obstructive sleep apnea)    Chronic fatigue syndrome with fibromyalgia    Hyperlipidemia    Ulcerative colitis (HCC)    Depression    CAD (coronary artery disease), CABG 1998, STENTS MULTIPLE SITES SINCE.     Accelerated hypertension    PLAN: Kara Bush seems to be doing fairly well.  She had improvement in her hemoglobin now up to 13.6%.  Her lipids are still not at target and I would advise adding Zetia  10 mg daily to her regimen.  Plan repeat lipid in 3 to  4 months.  Also she is due for repeat echocardiogram to reassess her aortic stenosis which was moderate last year with some mild aortic insufficiency.  I will contact her with those results but plan otherwise follow-up with me annually or sooner as necessary.  Kara KYM Maxcy, MD, [facility], FNLA, FACP  [place]  [facility]  Medical Director of the [facility]  [facility] Diplomate of the American Board of Clinical Lipidology Attending Cardiologist  Direct Dial: [phone]  Fax: [phone]  Website:  [email or web address]

## 2024-03-06 ENCOUNTER — Other Ambulatory Visit: Payer: Self-pay | Admitting: General Practice

## 2024-04-01 ENCOUNTER — Ambulatory Visit: Payer: Self-pay | Admitting: Internal Medicine

## 2024-04-01 ENCOUNTER — Ambulatory Visit (HOSPITAL_COMMUNITY)
Admission: RE | Admit: 2024-04-01 | Discharge: 2024-04-01 | Disposition: A | Discharge status: Home / Self Care | Source: Ambulatory Visit | Attending: Cardiology | Admitting: Cardiology

## 2024-04-01 DIAGNOSIS — E785 Hyperlipidemia, unspecified: Secondary | ICD-10-CM

## 2024-04-01 DIAGNOSIS — I350 Nonrheumatic aortic (valve) stenosis: Secondary | ICD-10-CM | POA: Insufficient documentation

## 2024-04-01 LAB — ECHOCARDIOGRAM COMPLETE
AR max vel: 1.12 cm2
AV Area VTI: 1.02 cm2
AV Area mean vel: 1.09 cm2
AV Mean grad: 18.2 mmHg
AV Peak grad: 31.1 mmHg
Ao pk vel: 2.79 m/s
Area-P 1/2: 3.99 cm2
P 1/2 time: 328 ms
S' Lateral: 2.5 cm

## 2024-04-18 DIAGNOSIS — N390 Urinary tract infection, site not specified: Secondary | ICD-10-CM | POA: Diagnosis not present

## 2024-04-18 DIAGNOSIS — N3941 Urge incontinence: Secondary | ICD-10-CM | POA: Diagnosis not present

## 2024-04-18 DIAGNOSIS — N904 Leukoplakia of vulva: Secondary | ICD-10-CM | POA: Diagnosis not present

## 2024-04-18 DIAGNOSIS — N814 Uterovaginal prolapse, unspecified: Secondary | ICD-10-CM | POA: Diagnosis not present

## 2024-04-20 ENCOUNTER — Encounter: Attending: Registered Nurse | Admitting: Registered Nurse

## 2024-04-20 ENCOUNTER — Encounter: Payer: Self-pay | Admitting: Registered Nurse

## 2024-04-20 VITALS — BP 120/69 | HR 78 | Ht 63.0 in | Wt 163.0 lb

## 2024-04-20 DIAGNOSIS — M546 Pain in thoracic spine: Secondary | ICD-10-CM | POA: Diagnosis not present

## 2024-04-20 DIAGNOSIS — M797 Fibromyalgia: Secondary | ICD-10-CM | POA: Diagnosis not present

## 2024-04-20 DIAGNOSIS — G894 Chronic pain syndrome: Secondary | ICD-10-CM | POA: Diagnosis not present

## 2024-04-20 DIAGNOSIS — G8929 Other chronic pain: Secondary | ICD-10-CM | POA: Insufficient documentation

## 2024-04-20 DIAGNOSIS — M7061 Trochanteric bursitis, right hip: Secondary | ICD-10-CM | POA: Diagnosis not present

## 2024-04-20 DIAGNOSIS — Z79891 Long term (current) use of opiate analgesic: Secondary | ICD-10-CM | POA: Insufficient documentation

## 2024-04-20 DIAGNOSIS — Z5181 Encounter for therapeutic drug level monitoring: Secondary | ICD-10-CM | POA: Insufficient documentation

## 2024-04-20 DIAGNOSIS — M25511 Pain in right shoulder: Secondary | ICD-10-CM | POA: Diagnosis not present

## 2024-04-20 DIAGNOSIS — M545 Low back pain, unspecified: Secondary | ICD-10-CM | POA: Diagnosis not present

## 2024-04-20 MED ORDER — HYDROCODONE-ACETAMINOPHEN 5-325 MG PO TABS: 1.0000 | ORAL_TABLET | Freq: Four times a day (QID) | ORAL | 0 refills | Status: DC | PRN

## 2024-04-20 NOTE — Progress Notes (Signed)
 Subjective:    Patient ID: Kara Bush, female    DOB: 29-Jan-1941, 83 y.o.   MRN: [id number]  HPI: Kara Bush is a 83 y.o. female whose appointment was changed to telephone visit due to transportation issue, her ride has COVID. I connected with  Kara Bush by telephone and verified that I am speaking with the correct person using two identifiers.  Location: Patient: In her Home Provider: In the office    I discussed the limitations, risks, security and privacy concerns of performing an evaluation and management service by telephone and the availability of in person appointments. I also discussed with the patient that there may be a patient responsible charge related to this service. The patient expressed understanding and agreed to proceed.   She states her pain is located in her right shoulder, mid- lower back and right hip pain. She rates her pain 3. Her current exercise regime is walking and performing stretching exercises.  [name] Morphine  equivalent is 25.00 MME.   Last Oral Swab was Performed on 12/25/2023, it was consistent.    Pain Inventory Average Pain 4 Pain Right Now 3 My pain is constant and aching  In the last 24 hours, has pain interfered with the following? General activity 4 Relation with others 0 Enjoyment of life 0 What TIME of day is your pain at its worst? morning  Sleep (in general) Good  Pain is worse with: walking and bending Pain improves with: rest and medication Relief from Meds: 5  Family History  Problem Relation Age of Onset   Heart failure Mother        at 34   Heart disease Father    Heart attack Father        @ 10   Healthy Sister    Cancer Brother        pancreatic    Cancer Paternal Aunt        BREAST   Heart failure Maternal Grandmother    Heart attack Maternal Grandfather    Heart attack Paternal Grandmother        at 79   Healthy Daughter    Healthy Daughter    Social History   Socioeconomic  History   Marital status: Divorced    Spouse name: Not on file   Number of children: 2   Years of education: college    Highest education level: Not on file  Occupational History    Employer: RETIRED  Tobacco Use   Smoking status: Former    Current packs/day: 0.00    Average packs/day: 2.5 packs/day for 20.0 years (50.0 ttl pk-yrs)    Types: Cigarettes    Start date: 06/29/1970    Quit date: 06/29/1990    Years since quitting: 33.8    Passive exposure: Never   Smokeless tobacco: Never  Vaping Use   Vaping status: Never Used  Substance and Sexual Activity   Alcohol  use: No   Drug use: No   Sexual activity: Not on file  Other Topics Concern   Not on file  Social History Narrative   Not on file   Social Drivers of Health   Financial Resource Strain: Not on file  Food Insecurity: No Food Insecurity (09/04/2023)   Hunger Vital Sign    Worried About Running Out of Food in the Last Year: Never true    Ran Out of Food in the Last Year: Never true  Transportation Needs: No Transportation Needs (09/04/2023)  PRAPARE - Administrator, Civil Service (Medical): No    Lack of Transportation (Non-Medical): No  Physical Activity: Not on file  Stress: Not on file  Social Connections: Socially Isolated (09/04/2023)   Social Connection and Isolation Panel    Frequency of Communication with Friends and Family: More than three times a week    Frequency of Social Gatherings with Friends and Family: Three times a week    Attends Religious Services: Never    Active Member of Clubs or Organizations: No    Attends Banker Meetings: Never    Marital Status: Divorced   Past Surgical History:  Procedure Laterality Date   AORTOBIEXTERNAL ILIAC BYPASS  1991   BIOPSY  11/25/2022   Procedure: BIOPSY;  Surgeon: Rollin Dover, MD;  Location: [facility] ENDOSCOPY;  Service: Gastroenterology;;   CATARACT EXTRACTION Left 09/2019   CHOLECYSTECTOMY  1997   COLONOSCOPY WITH PROPOFOL  N/A  11/25/2022   Procedure: COLONOSCOPY WITH PROPOFOL ;  Surgeon: Rollin Dover, MD;  Location: [facility] ENDOSCOPY;  Service: Gastroenterology;  Laterality: N/A;   CORONARY ANGIOPLASTY WITH STENT PLACEMENT  2006   to LAD, ATRETIC LIMA AT THAT TIME ALSO   CORONARY ANGIOPLASTY WITH STENT PLACEMENT  2007   STENT TO PROX VG-DIAG, OTHER STENTS PATENT   CORONARY ARTERY BYPASS GRAFT  1998   LIMA-LAD;VG-DIAG & RCA   CORONARY STENT PLACEMENT  2005   TO LAD & LCX-STENTS,   ESOPHAGOGASTRODUODENOSCOPY (EGD) WITH PROPOFOL  N/A 11/25/2022   Procedure: ESOPHAGOGASTRODUODENOSCOPY (EGD) WITH PROPOFOL ;  Surgeon: Rollin Dover, MD;  Location: [facility] ENDOSCOPY;  Service: Gastroenterology;  Laterality: N/A;   HERNIA REPAIR     TUBAL LIGATION     BTL   Past Surgical History:  Procedure Laterality Date   AORTOBIEXTERNAL ILIAC BYPASS  1991   BIOPSY  11/25/2022   Procedure: BIOPSY;  Surgeon: Rollin Dover, MD;  Location: [facility] ENDOSCOPY;  Service: Gastroenterology;;   CATARACT EXTRACTION Left 09/2019   CHOLECYSTECTOMY  1997   COLONOSCOPY WITH PROPOFOL  N/A 11/25/2022   Procedure: COLONOSCOPY WITH PROPOFOL ;  Surgeon: Rollin Dover, MD;  Location: [facility] ENDOSCOPY;  Service: Gastroenterology;  Laterality: N/A;   CORONARY ANGIOPLASTY WITH STENT PLACEMENT  2006   to LAD, ATRETIC LIMA AT THAT TIME ALSO   CORONARY ANGIOPLASTY WITH STENT PLACEMENT  2007   STENT TO PROX VG-DIAG, OTHER STENTS PATENT   CORONARY ARTERY BYPASS GRAFT  1998   LIMA-LAD;VG-DIAG & RCA   CORONARY STENT PLACEMENT  2005   TO LAD & LCX-STENTS,   ESOPHAGOGASTRODUODENOSCOPY (EGD) WITH PROPOFOL  N/A 11/25/2022   Procedure: ESOPHAGOGASTRODUODENOSCOPY (EGD) WITH PROPOFOL ;  Surgeon: Rollin Dover, MD;  Location: [facility] ENDOSCOPY;  Service: Gastroenterology;  Laterality: N/A;   HERNIA REPAIR     TUBAL LIGATION     BTL   Past Medical History:  Diagnosis Date   Abdominal aortic aneurysm (HCC)    Anemia, improved 01/11/2013   Basal cell carcinoma    CAD (coronary artery  disease)    hx CABG 1998, last cath 2007 with PCI and stenting to the proximal segment of the vein graft to the diagonal branch. DES Taxus was placed   Cervicalgia    Chronic fatigue fibromyalgia syndrome    Chronic pain syndrome    Depression    Fibromyalgia    History of nuclear stress test 08/07/2011   lexiscan ; no evidence of ischemia or infarct; low risk    Hyperlipidemia    Hypertension    OSA (obstructive sleep apnea)    PAD (  peripheral artery disease) (HCC)    Pes anserine bursitis    Pulmonary embolism (HCC) 01/11/2013   S/P resection of aortic aneurysm    Trochanteric bursitis    Ulcerative colitis (HCC)    BP 120/69 (Patient Position: Sitting, Cuff Size: Normal) Comment: patient obtained  Pulse 78   Ht 5' 3 (1.6 m)   Wt 163 lb (73.9 kg) Comment: patient reported  BMI 28.87 kg/m   Opioid Risk Score:   Fall Risk Score:  `1  Depression screen [facility] 2/9     04/20/2024   10:03 AM 02/22/2024    1:39 PM 12/25/2023    1:39 PM 10/22/2023   10:20 AM 08/20/2023    1:35 PM 04/22/2023    2:28 PM 02/25/2023    1:31 PM  Depression screen PHQ 2/9  Decreased Interest 1 0 0 0 0 0 0  Down, Depressed, Hopeless 0 0 0 0 0 0 0  PHQ - 2 Score 1 0 0 0 0 0 0      Review of Systems  Musculoskeletal:  Positive for arthralgias and joint swelling.       Fibromyalgia, bilateral thoracic back pain, low back pain, shoulder pain  All other systems reviewed and are negative.      Objective:   Physical Exam Vitals and nursing note reviewed.  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 Continue to Monitor, Continue Current Medication and exercise regime. 04/20/2024 2. Fibromyalgia: Continue  Home Exercise Program. Continue to Monitor.04/20/2024 3. Bilateral intermittent hand numbness: Carpal tunnel syndrome versus C6  radiculopathy mild: No complaints today. Continue to Monitor. 04/20/2024 4. Chronic Midline Low Back Pain/ LBP: Refilled: Norco 5/325mg  # 140 pills--use one pill every 6 hours as needed for pain. A second prescription was e-scribe for the following month 04/20/2024 We will continue the opioid monitoring program, this consists of regular clinic visits, examinations, urine drug screen, pill counts as well as use of [place]  Controlled Substance Reporting system. A 12 month History has been reviewed on the [place]  Controlled Substance Reporting System on 04/20/2024. 5. Muscle Spasm: Continue current medication regimen with  Robaxin . 04/20/2024 6.Right  Greater Trochanteric Bursitis:Continue to Alternate with heat and ice therapy. Continue current medication regime. 04/20/2024  7. Left Foot pain: No complaints today.Continue with HEP as Tolerated. Continue to Monitor. 04/20/2024 8. Right Lateral Epicondylitis Pain: No complaints Today. Continue HEP as Tolerated. Continue to Monitor. 04/20/2024 9. Chronic Right Shoulder Pain /Right Shoulder Tendonitis: Continue Current Medication Regimen. Continue to Alternate Ice and Heat Therapy. 04/20/2024 10 Right Chronic Knee Pain: .No complaints today. Continue HEP as Tolerated. Continue to Monitor. 04/20/2024   F/U in 2 month Established Patient Location of Patient in her Home Location of Provider: In the Office

## 2024-04-27 DIAGNOSIS — H02052 Trichiasis without entropian right lower eyelid: Secondary | ICD-10-CM | POA: Diagnosis not present

## 2024-04-27 DIAGNOSIS — H3563 Retinal hemorrhage, bilateral: Secondary | ICD-10-CM | POA: Diagnosis not present

## 2024-04-28 ENCOUNTER — Inpatient Hospital Stay (HOSPITAL_BASED_OUTPATIENT_CLINIC_OR_DEPARTMENT_OTHER): Admitting: Hematology and Oncology

## 2024-04-28 ENCOUNTER — Encounter: Payer: Self-pay | Admitting: Hematology and Oncology

## 2024-04-28 ENCOUNTER — Inpatient Hospital Stay: Attending: Hematology and Oncology

## 2024-04-28 VITALS — BP 112/58 | HR 81 | Resp 17 | Ht 63.0 in | Wt 165.2 lb

## 2024-04-28 DIAGNOSIS — D500 Iron deficiency anemia secondary to blood loss (chronic): Secondary | ICD-10-CM | POA: Insufficient documentation

## 2024-04-28 DIAGNOSIS — D508 Other iron deficiency anemias: Secondary | ICD-10-CM

## 2024-04-28 DIAGNOSIS — K922 Gastrointestinal hemorrhage, unspecified: Secondary | ICD-10-CM | POA: Diagnosis not present

## 2024-04-28 DIAGNOSIS — Z7901 Long term (current) use of anticoagulants: Secondary | ICD-10-CM | POA: Insufficient documentation

## 2024-04-28 LAB — IRON AND IRON BINDING CAPACITY (CC-WL,HP ONLY)
Iron: 65 ug/dL (ref 28–170)
Saturation Ratios: 22 % (ref 10.4–31.8)
TIBC: 302 ug/dL (ref 250–450)
UIBC: 237 ug/dL (ref 148–442)

## 2024-04-28 LAB — CBC WITH DIFFERENTIAL (CANCER CENTER ONLY)
Abs Immature Granulocytes: 0.02 K/uL (ref 0.00–0.07)
Basophils Absolute: 0 K/uL (ref 0.0–0.1)
Basophils Relative: 0 %
Eosinophils Absolute: 0.2 K/uL (ref 0.0–0.5)
Eosinophils Relative: 3 %
HCT: 41.4 % (ref 36.0–46.0)
Hemoglobin: 13.3 g/dL (ref 12.0–15.0)
Immature Granulocytes: 0 %
Lymphocytes Relative: 25 %
Lymphs Abs: 1.8 K/uL (ref 0.7–4.0)
MCH: 27.5 pg (ref 26.0–34.0)
MCHC: 32.1 g/dL (ref 30.0–36.0)
MCV: 85.5 fL (ref 80.0–100.0)
Monocytes Absolute: 0.6 K/uL (ref 0.1–1.0)
Monocytes Relative: 8 %
Neutro Abs: 4.3 K/uL (ref 1.7–7.7)
Neutrophils Relative %: 64 %
Platelet Count: 193 K/uL (ref 150–400)
RBC: 4.84 MIL/uL (ref 3.87–5.11)
RDW: 13.3 % (ref 11.5–15.5)
WBC Count: 6.9 K/uL (ref 4.0–10.5)
nRBC: 0 % (ref 0.0–0.2)

## 2024-04-28 LAB — FERRITIN: Ferritin: 230 ng/mL (ref 11–307)

## 2024-04-28 NOTE — Assessment & Plan Note (Addendum)
 The most likely cause of her history of iron  deficiency anemia is due to GI bleed from chronic anticoagulation therapy Since she had received intravenous iron , anemia has resolved She appears to tolerate oral iron  supplement well and her blood count continues to improve  Iron  studies are pending We will call her with test results tomorrow If her iron  studies are adequate, she can stop oral iron  supplement She will follow with her primary care doctor next year for repeat labs She will contact me if she has recurrent anemia again

## 2024-04-28 NOTE — Progress Notes (Signed)
   [facility] OFFICE PROGRESS NOTE  Dayna Motto, DO  ASSESSMENT & PLAN:  Assessment & Plan Other iron  deficiency anemia The most likely cause of her history of iron  deficiency anemia is due to GI bleed from chronic anticoagulation therapy Since she had received intravenous iron , anemia has resolved She appears to tolerate oral iron  supplement well and her blood count continues to improve  Iron  studies are pending We will call her with test results tomorrow If her iron  studies are adequate, she can stop oral iron  supplement She will follow with her primary care doctor next year for repeat labs She will contact me if she has recurrent anemia again    No orders of the defined types were placed in this encounter.   INTERVAL HISTORY: Patient returns for recurrent anemia Symptoms of anemia includes none We reviewed CBC result  SUMMARY OF HEMATOLOGIC HISTORY:  She was found to have abnormal CBC from recent admission On September 03, 2023, hemoglobin was 5.8 with MCV of 59.8 Iron  studies were abnormal She has 4% iron  saturation and ferritin of 2 I have the opportunity to review his CBC dated back to 2012 On July 31, 2011, white count was 14.1, hemoglobin 11.9 and platelet count of 276 Between 20 12-20 23, her hemoglobin has been normal, as high as 13.3 Starting on August 23, 2022, she had significant drop in her blood count with a hemoglobin of 6.2 She had extensive evaluation including EGD and colonoscopy.  Her last colonoscopy was on 11/25/2022.  Dr. Rollin perform both colonoscopy and EGD. Colonoscopy revealed - Mild (Mayo Score 1) ulcerative colitis, unchanged since the last examination. Biopsied.A single non-bleeding colonic angiodysplastic lesion. Diverticulosis in the sigmoid colon and in the descending colon. EGD revealed  - 3 cm hiatal hernia. Gastroesophageal flap valve classified as Hill Grade IV (no fold, wide open lumen, hiatal hernia present). Normal  stomach. Normal examined duodenum. In July 2024, she was admitted briefly for feeling unwell and transient acute kidney injury Since then, hemoglobin has never been normal with microcytic anemia  Last week, she has significant upper chest discomfort and excessive fatigue leading to ER visit and was found to have severe anemia Since blood transfusion, her symptoms has improved She denies shortness of breath on exertion or presyncopal episodes  She had not noticed any recent bleeding such as epistaxis, hematuria or hematochezia The patient denies over the counter NSAID ingestion. She is on chronic anticoagulation therapy on apixaban   She had no prior history or diagnosis of cancer except for basal cell carcinoma of the skin. Her age appropriate screening programs are up-to-date. She has pica with ice craving and eats a variety of diet, although in general, her diet is low in protein and meat She does not drink enough water.  In general, she drink approximately 32 ounces of liquid despite being on diuretic She never donated blood  The patient was prescribed oral iron  supplements and she takes daily in the morning with breakfast since last week She received 2 doses of intravenous iron  Feraheme in February 2025 Between March to September 2025, she resume oral iron  supplement  Lab Results  Component Value Date   VITAMINB12 349 10/27/2023   FERRITIN 209 10/27/2023   HGB 13.3 04/28/2024   RBC 4.84 04/28/2024   Vitals:   04/28/24 1130  BP: (!) 112/58  Pulse: 81  Resp: 17  SpO2: 98%

## 2024-04-29 ENCOUNTER — Telehealth: Payer: Self-pay

## 2024-04-29 NOTE — Telephone Encounter (Addendum)
 Called patient to relay message below as per Dr. Lonn, patient voiced full understanding and had no further questions at this time.   ----- Message from Almarie Lonn sent at 04/29/2024  9:20 AM EDT ----- Her iron  studies are actually normal/borderline high She can stop oral iron 

## 2024-05-06 DIAGNOSIS — N814 Uterovaginal prolapse, unspecified: Secondary | ICD-10-CM | POA: Diagnosis not present

## 2024-05-06 DIAGNOSIS — Z4689 Encounter for fitting and adjustment of other specified devices: Secondary | ICD-10-CM | POA: Diagnosis not present

## 2024-06-13 DIAGNOSIS — H02052 Trichiasis without entropian right lower eyelid: Secondary | ICD-10-CM | POA: Diagnosis not present

## 2024-06-15 DIAGNOSIS — F3341 Major depressive disorder, recurrent, in partial remission: Secondary | ICD-10-CM | POA: Diagnosis not present

## 2024-06-15 DIAGNOSIS — N1832 Chronic kidney disease, stage 3b: Secondary | ICD-10-CM | POA: Diagnosis not present

## 2024-06-15 DIAGNOSIS — I480 Paroxysmal atrial fibrillation: Secondary | ICD-10-CM | POA: Diagnosis not present

## 2024-06-15 DIAGNOSIS — I2510 Atherosclerotic heart disease of native coronary artery without angina pectoris: Secondary | ICD-10-CM | POA: Diagnosis not present

## 2024-06-15 DIAGNOSIS — I5032 Chronic diastolic (congestive) heart failure: Secondary | ICD-10-CM | POA: Diagnosis not present

## 2024-06-15 DIAGNOSIS — G4700 Insomnia, unspecified: Secondary | ICD-10-CM | POA: Diagnosis not present

## 2024-06-15 DIAGNOSIS — Z951 Presence of aortocoronary bypass graft: Secondary | ICD-10-CM | POA: Diagnosis not present

## 2024-06-20 ENCOUNTER — Encounter: Payer: Self-pay | Admitting: Registered Nurse

## 2024-06-20 ENCOUNTER — Encounter: Attending: Registered Nurse | Admitting: Registered Nurse

## 2024-06-20 VITALS — BP 130/69 | HR 75 | Ht 63.0 in | Wt 167.6 lb

## 2024-06-20 DIAGNOSIS — G8929 Other chronic pain: Secondary | ICD-10-CM | POA: Diagnosis not present

## 2024-06-20 DIAGNOSIS — M7062 Trochanteric bursitis, left hip: Secondary | ICD-10-CM | POA: Insufficient documentation

## 2024-06-20 DIAGNOSIS — M545 Low back pain, unspecified: Secondary | ICD-10-CM | POA: Diagnosis not present

## 2024-06-20 DIAGNOSIS — Z5181 Encounter for therapeutic drug level monitoring: Secondary | ICD-10-CM | POA: Diagnosis not present

## 2024-06-20 DIAGNOSIS — G894 Chronic pain syndrome: Secondary | ICD-10-CM | POA: Diagnosis not present

## 2024-06-20 DIAGNOSIS — Z79891 Long term (current) use of opiate analgesic: Secondary | ICD-10-CM | POA: Insufficient documentation

## 2024-06-20 DIAGNOSIS — M7061 Trochanteric bursitis, right hip: Secondary | ICD-10-CM | POA: Insufficient documentation

## 2024-06-20 DIAGNOSIS — M546 Pain in thoracic spine: Secondary | ICD-10-CM | POA: Diagnosis not present

## 2024-06-20 MED ORDER — HYDROCODONE-ACETAMINOPHEN 5-325 MG PO TABS: 1.0000 | ORAL_TABLET | Freq: Four times a day (QID) | ORAL | 0 refills | Status: DC | PRN

## 2024-06-20 NOTE — Progress Notes (Signed)
 Subjective:    Patient ID: Kara Bush, female    DOB: 12-25-1940, 83 y.o.   MRN: [id number]  HPI: Kara Bush is a 83 y.o. female who returns for follow up appointment for chronic pain and medication refill. She states her pain is located in her mid- back mainly right side, lower back and bilateral hips R>L. She rates her pain 4. Her current exercise regime is walking and performing stretching exercises.  Ms. [place] Morphine  equivalent is 25.00 MME.   Oral Swab was Performed today.   Pain Inventory Average Pain 4 Pain Right Now 4 My pain is intermittent and aching  In the last 24 hours, has pain interfered with the following? General activity 4 Relation with others 0 Enjoyment of life 2 What TIME of day is your pain at its worst? morning  Sleep (in general) Good  Pain is worse with: walking, bending, and some activites Pain improves with: rest and medication Relief from Meds: 5  Family History  Problem Relation Age of Onset   Heart failure Mother        at 15   Heart disease Father    Heart attack Father        @ 52   Healthy Sister    Cancer Brother        pancreatic    Cancer Paternal Aunt        BREAST   Heart failure Maternal Grandmother    Heart attack Maternal Grandfather    Heart attack Paternal Grandmother        at 57   Healthy Daughter    Healthy Daughter    Social History   Socioeconomic History   Marital status: Divorced    Spouse name: Not on file   Number of children: 2   Years of education: college    Highest education level: Not on file  Occupational History    Employer: RETIRED  Tobacco Use   Smoking status: Former    Current packs/day: 0.00    Average packs/day: 2.5 packs/day for 20.0 years (50.0 ttl pk-yrs)    Types: Cigarettes    Start date: 06/29/1970    Quit date: 06/29/1990    Years since quitting: 34.0    Passive exposure: Never   Smokeless tobacco: Never  Vaping Use   Vaping status: Never Used  Substance and  Sexual Activity   Alcohol  use: No   Drug use: No   Sexual activity: Not on file  Other Topics Concern   Not on file  Social History Narrative   Not on file   Social Drivers of Health   Financial Resource Strain: Not on file  Food Insecurity: No Food Insecurity (09/04/2023)   Hunger Vital Sign    Worried About Running Out of Food in the Last Year: Never true    Ran Out of Food in the Last Year: Never true  Transportation Needs: No Transportation Needs (09/04/2023)   PRAPARE - Administrator, Civil Service (Medical): No    Lack of Transportation (Non-Medical): No  Physical Activity: Not on file  Stress: Not on file  Social Connections: Socially Isolated (09/04/2023)   Social Connection and Isolation Panel    Frequency of Communication with Friends and Family: More than three times a week    Frequency of Social Gatherings with Friends and Family: Three times a week    Attends Religious Services: Never    Active Member of Clubs or Organizations: No  Attends Banker Meetings: Never    Marital Status: Divorced   Past Surgical History:  Procedure Laterality Date   AORTOBIEXTERNAL ILIAC BYPASS  1991   BIOPSY  11/25/2022   Procedure: BIOPSY;  Surgeon: Rollin Dover, MD;  Location: [facility] ENDOSCOPY;  Service: Gastroenterology;;   CATARACT EXTRACTION Left 09/2019   CHOLECYSTECTOMY  1997   COLONOSCOPY WITH PROPOFOL  N/A 11/25/2022   Procedure: COLONOSCOPY WITH PROPOFOL ;  Surgeon: Rollin Dover, MD;  Location: [facility] ENDOSCOPY;  Service: Gastroenterology;  Laterality: N/A;   CORONARY ANGIOPLASTY WITH STENT PLACEMENT  2006   to LAD, ATRETIC LIMA AT THAT TIME ALSO   CORONARY ANGIOPLASTY WITH STENT PLACEMENT  2007   STENT TO PROX VG-DIAG, OTHER STENTS PATENT   CORONARY ARTERY BYPASS GRAFT  1998   LIMA-LAD;VG-DIAG & RCA   CORONARY STENT PLACEMENT  2005   TO LAD & LCX-STENTS,   ESOPHAGOGASTRODUODENOSCOPY (EGD) WITH PROPOFOL  N/A 11/25/2022   Procedure:  ESOPHAGOGASTRODUODENOSCOPY (EGD) WITH PROPOFOL ;  Surgeon: Rollin Dover, MD;  Location: [facility] ENDOSCOPY;  Service: Gastroenterology;  Laterality: N/A;   HERNIA REPAIR     TUBAL LIGATION     BTL   Past Surgical History:  Procedure Laterality Date   AORTOBIEXTERNAL ILIAC BYPASS  1991   BIOPSY  11/25/2022   Procedure: BIOPSY;  Surgeon: Rollin Dover, MD;  Location: [facility] ENDOSCOPY;  Service: Gastroenterology;;   CATARACT EXTRACTION Left 09/2019   CHOLECYSTECTOMY  1997   COLONOSCOPY WITH PROPOFOL  N/A 11/25/2022   Procedure: COLONOSCOPY WITH PROPOFOL ;  Surgeon: Rollin Dover, MD;  Location: [facility] ENDOSCOPY;  Service: Gastroenterology;  Laterality: N/A;   CORONARY ANGIOPLASTY WITH STENT PLACEMENT  2006   to LAD, ATRETIC LIMA AT THAT TIME ALSO   CORONARY ANGIOPLASTY WITH STENT PLACEMENT  2007   STENT TO PROX VG-DIAG, OTHER STENTS PATENT   CORONARY ARTERY BYPASS GRAFT  1998   LIMA-LAD;VG-DIAG & RCA   CORONARY STENT PLACEMENT  2005   TO LAD & LCX-STENTS,   ESOPHAGOGASTRODUODENOSCOPY (EGD) WITH PROPOFOL  N/A 11/25/2022   Procedure: ESOPHAGOGASTRODUODENOSCOPY (EGD) WITH PROPOFOL ;  Surgeon: Rollin Dover, MD;  Location: [facility] ENDOSCOPY;  Service: Gastroenterology;  Laterality: N/A;   HERNIA REPAIR     TUBAL LIGATION     BTL   Past Medical History:  Diagnosis Date   Abdominal aortic aneurysm    Anemia, improved 01/11/2013   Basal cell carcinoma    CAD (coronary artery disease)    hx CABG 1998, last cath 2007 with PCI and stenting to the proximal segment of the vein graft to the diagonal branch. DES Taxus was placed   Cervicalgia    Chronic fatigue fibromyalgia syndrome    Chronic pain syndrome    Depression    Fibromyalgia    History of nuclear stress test 08/07/2011   lexiscan ; no evidence of ischemia or infarct; low risk    Hyperlipidemia    Hypertension    OSA (obstructive sleep apnea)    PAD (peripheral artery disease)    Pes anserine bursitis    Pulmonary embolism (HCC) 01/11/2013   S/P  resection of aortic aneurysm    Trochanteric bursitis    Ulcerative colitis (HCC)    BP (!) 159/72 (BP Location: Left Arm, Patient Position: Sitting, Cuff Size: Large)   Pulse 75   Ht 5' 3 (1.6 m)   Wt 167 lb 9.6 oz (76 kg)   SpO2 98%   BMI 29.69 kg/m   Opioid Risk Score:   Fall Risk Score:  `1  Depression screen PHQ  2/9     04/20/2024   10:03 AM 02/22/2024    1:39 PM 12/25/2023    1:39 PM 10/22/2023   10:20 AM 08/20/2023    1:35 PM 04/22/2023    2:28 PM 02/25/2023    1:31 PM  Depression screen PHQ 2/9  Decreased Interest 1 0 0 0 0 0 0  Down, Depressed, Hopeless 0 0 0 0 0 0 0  PHQ - 2 Score 1 0 0 0 0 0 0      Review of Systems  Musculoskeletal:  Positive for arthralgias, back pain and joint swelling.       Upper and lower back pain, bilateral hip pain, right foot pain  All other systems reviewed and are negative.      Objective:   Physical Exam Vitals and nursing note reviewed.  Constitutional:      Appearance: Normal appearance.  Cardiovascular:     Rate and Rhythm: Normal rate and regular rhythm.     Pulses: Normal pulses.     Heart sounds: Normal heart sounds.  Pulmonary:     Breath sounds: Normal breath sounds.  Musculoskeletal:     Comments: Normal Muscle Bulk and Muscle Testing Reveals:  Upper Extremities: Right: Decreased ROM 90 Degrees and Muscle Strength 5/5 Left Upper Extremity: Full ROM and Muscle Strength 5/5 Thoracic Paraspinal Tenderness: T-1-6 Lumbar Paraspinal Tenderness: L-4-L-5 Bilateral Greater Trochanter Tenderness Lower Extremities: Full ROM and Muscle Strength 5/5 Arises from chair slowly Narrow Based Gait     Skin:    General: Skin is warm and dry.  Neurological:     Mental Status: She is alert and oriented to person, place, and time.  Psychiatric:        Mood and Affect: Mood normal.        Behavior: Behavior normal.          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 Continue to Monitor, Continue Current Medication and exercise regime. 06/20/2024 2. Fibromyalgia: Continue  Home Exercise Program. Continue to Monitor.06/20/2024 3. Bilateral intermittent hand numbness: Carpal tunnel syndrome versus C6 radiculopathy mild: No complaints today. Continue to Monitor. 06/20/2024 4. Chronic Midline Low Back Pain/ LBP: Refilled: Norco 5/325mg  # 140 pills--use one pill every 6 hours as needed for pain. A second prescription was e-scribe for the following month 06/20/2024 We will continue the opioid monitoring program, this consists of regular clinic visits, examinations, urine drug screen, pill counts as well as use of [place]  Controlled Substance Reporting system. A 12 month History has been reviewed on the [place]  Controlled Substance Reporting System on 06/20/2024. 5. Muscle Spasm: Continue current medication regimen with  Robaxin . 06/20/2024 6.Bilateral  Greater Trochanteric Bursitis:Continue to Alternate with heat and ice therapy. Continue current medication regime. 06/20/2024  7. Left Foot pain: No complaints today.Continue with HEP as Tolerated. Continue to Monitor. 06/20/2024 8. Right Lateral Epicondylitis Pain: No complaints Today. Continue HEP as Tolerated. Continue to Monitor. 06/20/2024 9. Chronic Right Shoulder Pain /Right Shoulder Tendonitis: Continue Current Medication Regimen. Continue to Alternate Ice and Heat Therapy. 06/20/2024 10 Right Chronic Knee Pain: .No complaints today. Continue HEP as Tolerated. Continue to Monitor. 06/20/2024   F/U in 2 month

## 2024-06-23 LAB — DRUG TOX MONITOR 1 W/CONF, ORAL FLD
Amphetamines: NEGATIVE ng/mL (ref <10)
Barbiturates: NEGATIVE ng/mL (ref <10)
Benzodiazepines: NEGATIVE ng/mL (ref <0.50)
Buprenorphine: NEGATIVE ng/mL (ref <0.10)
Cocaine: NEGATIVE ng/mL (ref <5.0)
Codeine: NEGATIVE ng/mL (ref <2.5)
Dihydrocodeine: 35.6 ng/mL — ABNORMAL HIGH (ref <2.5)
Fentanyl: NEGATIVE ng/mL (ref <0.10)
Heroin Metabolite: NEGATIVE ng/mL (ref <1.0)
Hydrocodone: 233 ng/mL — ABNORMAL HIGH (ref <2.5)
Hydromorphone: NEGATIVE ng/mL (ref <2.5)
MARIJUANA: NEGATIVE ng/mL (ref <2.5)
MDMA: NEGATIVE ng/mL (ref <10)
Meprobamate: NEGATIVE ng/mL (ref <2.5)
Methadone: NEGATIVE ng/mL (ref <5.0)
Morphine: NEGATIVE ng/mL (ref <2.5)
Nicotine Metabolite: NEGATIVE ng/mL (ref <5.0)
Norhydrocodone: 14.3 ng/mL — ABNORMAL HIGH (ref <2.5)
Noroxycodone: NEGATIVE ng/mL (ref <2.5)
Opiates: POSITIVE ng/mL — AB (ref <2.5)
Oxycodone: NEGATIVE ng/mL (ref <2.5)
Oxymorphone: NEGATIVE ng/mL (ref <2.5)
Phencyclidine: NEGATIVE ng/mL (ref <10)
Tapentadol: NEGATIVE ng/mL (ref <5.0)
Tramadol: NEGATIVE ng/mL (ref <5.0)
Zolpidem: NEGATIVE ng/mL (ref <5.0)

## 2024-06-23 LAB — DRUG TOX ALC METAB W/CON, ORAL FLD: Alcohol Metabolite: NEGATIVE ng/mL (ref <25)

## 2024-07-11 DIAGNOSIS — H02052 Trichiasis without entropian right lower eyelid: Secondary | ICD-10-CM | POA: Diagnosis not present

## 2024-07-11 NOTE — Progress Notes (Deleted)
 Office Visit Note  Patient: Kara Bush             Date of Birth: 02/18/1941           MRN: [id number]             PCP: Dayna Motto, DO Referring: Dayna Motto, DO Visit Date: 07/25/2024 Occupation: Data Unavailable  Subjective:  No chief complaint on file.   History of Present Illness: ROSSLYN PASION is a 83 y.o. female ***     Activities of Daily Living:  Patient reports morning stiffness for *** {minute/hour:19697}.   Patient {ACTIONS;DENIES/REPORTS:21021675::Denies} nocturnal pain.  Difficulty dressing/grooming: {ACTIONS;DENIES/REPORTS:21021675::Denies} Difficulty climbing stairs: {ACTIONS;DENIES/REPORTS:21021675::Denies} Difficulty getting out of chair: {ACTIONS;DENIES/REPORTS:21021675::Denies} Difficulty using hands for taps, buttons, cutlery, and/or writing: {ACTIONS;DENIES/REPORTS:21021675::Denies}  No Rheumatology ROS completed.   PMFS History:  Patient Active Problem List   Diagnosis Date Noted   Iron  deficiency anemia 09/07/2023   Acute on chronic systolic CHF (congestive heart failure) (HCC) 03/03/2023   Acute on chronic diastolic congestive heart failure (HCC) 03/02/2023   Pulmonary embolism with infarction (HCC) 03/02/2023   Chronic heart failure with preserved ejection fraction (HFpEF) (HCC) 02/11/2023   Hypervolemia 02/11/2023   Acute encephalopathy 02/05/2023   Elevated troponin 02/05/2023   Hypokalemia 02/05/2023   Hypomagnesemia 02/05/2023   Intractable nausea and vomiting 02/05/2023   Metabolic alkalosis 02/05/2023   Demand ischemia (HCC) 11/24/2022   Chest pain 11/23/2022   Non-ST elevation (NSTEMI) myocardial infarction (HCC) 11/23/2022   Acute gastrointestinal bleeding 11/23/2022   Chronic combined systolic (congestive) and diastolic (congestive) heart failure (HCC) 11/23/2022   CKD (chronic kidney disease) stage 3, GFR 30-59 ml/min (HCC) 11/23/2022   DNR (do not resuscitate) 11/23/2022   Anxiety disorder 09/25/2021    Paroxysmal atrial fibrillation (HCC) 02/26/2021   Atrial fibrillation (HCC) 09/09/2018   DOE (dyspnea on exertion) 08/10/2017   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   Greater trochanteric bursitis of right hip 11/07/2014   Sacro-iliac pain 11/07/2014   Chronic midline low back pain without sciatica 11/07/2013   Hepatitis 04/20/2013   Screening for STD (sexually transmitted disease) 04/20/2013   Recurrent UTI 04/20/2013   Abdominal aortic aneurysm    Ulcerative colitis (HCC)    Obstructive sleep apnea    Fibromyalgia    Coronary artery disease    Basal cell carcinoma    Symptomatic anemia 01/11/2013   Ascending aortic aneurysm, 4.1 cm 01/11/2013   Cervical spondylolysis 10/15/2011   Chronic periscapular pain 10/15/2011   Chest wall pain    S/P resection of aortic aneurysm, 1998 prior to CABG    PAD (peripheral artery disease), aortic-iliac bypass 1991, mild carotid bruits    OSA (obstructive sleep apnea)    Chronic fatigue syndrome with fibromyalgia    Hyperlipidemia    Ulcerative colitis (HCC)    Depression    CAD (coronary artery disease), CABG 1998, STENTS MULTIPLE SITES SINCE.     Accelerated hypertension     Past Medical History:  Diagnosis Date   Abdominal aortic aneurysm    Anemia, improved 01/11/2013   Basal cell carcinoma    CAD (coronary artery disease)    hx CABG 1998, last cath 2007 with PCI and stenting to the proximal segment of the vein graft to the diagonal branch. DES Taxus was placed   Cervicalgia    Chronic fatigue fibromyalgia syndrome    Chronic pain syndrome    Depression    Fibromyalgia    History of nuclear stress test  08/07/2011   lexiscan ; no evidence of ischemia or infarct; low risk    Hyperlipidemia    Hypertension    OSA (obstructive sleep apnea)    PAD (peripheral artery disease)    Pes anserine bursitis    Pulmonary embolism (HCC) 01/11/2013   S/P resection of aortic aneurysm    Trochanteric bursitis    Ulcerative  colitis (HCC)     Family History  Problem Relation Age of Onset   Heart failure Mother        at 31   Heart disease Father    Heart attack Father        @ 27   Healthy Sister    Cancer Brother        pancreatic    Cancer Paternal Aunt        BREAST   Heart failure Maternal Grandmother    Heart attack Maternal Grandfather    Heart attack Paternal Grandmother        at 58   Healthy Daughter    Healthy Daughter    Past Surgical History:  Procedure Laterality Date   AORTOBIEXTERNAL ILIAC BYPASS  1991   BIOPSY  11/25/2022   Procedure: BIOPSY;  Surgeon: Rollin Dover, MD;  Location: [facility] ENDOSCOPY;  Service: Gastroenterology;;   CATARACT EXTRACTION Left 09/2019   CHOLECYSTECTOMY  1997   COLONOSCOPY WITH PROPOFOL  N/A 11/25/2022   Procedure: COLONOSCOPY WITH PROPOFOL ;  Surgeon: Rollin Dover, MD;  Location: [facility] ENDOSCOPY;  Service: Gastroenterology;  Laterality: N/A;   CORONARY ANGIOPLASTY WITH STENT PLACEMENT  2006   to LAD, ATRETIC LIMA AT THAT TIME ALSO   CORONARY ANGIOPLASTY WITH STENT PLACEMENT  2007   STENT TO PROX VG-DIAG, OTHER STENTS PATENT   CORONARY ARTERY BYPASS GRAFT  1998   LIMA-LAD;VG-DIAG & RCA   CORONARY STENT PLACEMENT  2005   TO LAD & LCX-STENTS,   ESOPHAGOGASTRODUODENOSCOPY (EGD) WITH PROPOFOL  N/A 11/25/2022   Procedure: ESOPHAGOGASTRODUODENOSCOPY (EGD) WITH PROPOFOL ;  Surgeon: Rollin Dover, MD;  Location: [facility] ENDOSCOPY;  Service: Gastroenterology;  Laterality: N/A;   HERNIA REPAIR     TUBAL LIGATION     BTL   Social History   Tobacco Use   Smoking status: Former    Current packs/day: 0.00    Average packs/day: 2.5 packs/day for 20.0 years (50.0 ttl pk-yrs)    Types: Cigarettes    Start date: 06/29/1970    Quit date: 06/29/1990    Years since quitting: 34.0    Passive exposure: Never   Smokeless tobacco: Never  Vaping Use   Vaping status: Never Used  Substance Use Topics   Alcohol  use: No   Drug use: No   Social History   Social History Narrative    Not on file     Immunization History  Administered Date(s) Administered   Influenza,inj,Quad PF,6+ Mos 04/20/2013, 06/01/2014   PFIZER(Purple Top)SARS-COV-2 Vaccination 08/18/2019, 09/11/2019, 04/27/2020   Pfizer(Comirnaty)Fall Seasonal Vaccine 12 years and older 05/17/2024     Objective: Vital Signs: There were no vitals taken for this visit.   Physical Exam   Musculoskeletal Exam: ***  CDAI Exam: CDAI Score: -- Patient Global: --; Provider Global: -- Swollen: --; Tender: -- Joint Exam 07/25/2024   No joint exam has been documented for this visit   There is currently no information documented on the homunculus. Go to the Rheumatology activity and complete the homunculus joint exam.  Investigation: No additional findings.  Imaging: No results found.  Recent Labs: Lab Results  Component Value  Date   WBC 6.9 04/28/2024   HGB 13.3 04/28/2024   PLT 193 04/28/2024   NA 142 10/27/2023   K 4.0 10/27/2023   CL 107 10/27/2023   CO2 30 10/27/2023   GLUCOSE 108 (H) 10/27/2023   BUN 27 (H) 10/27/2023   CREATININE 1.46 (H) 10/27/2023   BILITOT 0.7 03/03/2023   ALKPHOS 48 03/03/2023   AST 19 03/03/2023   ALT 13 03/03/2023   PROT 6.2 (L) 03/03/2023   ALBUMIN 3.2 (L) 03/03/2023   CALCIUM  9.5 10/27/2023   GFRAA 73 10/13/2019    Speciality Comments: No specialty comments available.  Procedures:  No procedures performed Allergies: Biotin, Lisinopril , Atenolol, Duloxetine hcl, Lyrica [pregabalin], Penicillin g, Penicillins, and Sulfamethoxazole-trimethoprim   Assessment / Plan:     Visit Diagnoses: No diagnosis found.  Orders: No orders of the defined types were placed in this encounter.  No orders of the defined types were placed in this encounter.   Face-to-face time spent with patient was *** minutes. Greater than 50% of time was spent in counseling and coordination of care.  Follow-Up Instructions: No follow-ups on file.   Maya Nash, MD  Note -  This record has been created using Animal nutritionist.  Chart creation errors have been sought, but may not always  have been located. Such creation errors do not reflect on  the standard of medical care.

## 2024-07-23 ENCOUNTER — Other Ambulatory Visit: Payer: Self-pay | Admitting: Internal Medicine

## 2024-07-25 ENCOUNTER — Ambulatory Visit: Admitting: Rheumatology

## 2024-07-25 DIAGNOSIS — F5101 Primary insomnia: Secondary | ICD-10-CM

## 2024-07-25 DIAGNOSIS — Z8679 Personal history of other diseases of the circulatory system: Secondary | ICD-10-CM

## 2024-07-25 DIAGNOSIS — Z8719 Personal history of other diseases of the digestive system: Secondary | ICD-10-CM

## 2024-07-25 DIAGNOSIS — M4302 Spondylolysis, cervical region: Secondary | ICD-10-CM

## 2024-07-25 DIAGNOSIS — M19041 Primary osteoarthritis, right hand: Secondary | ICD-10-CM

## 2024-07-25 DIAGNOSIS — I5033 Acute on chronic diastolic (congestive) heart failure: Secondary | ICD-10-CM

## 2024-07-25 DIAGNOSIS — E785 Hyperlipidemia, unspecified: Secondary | ICD-10-CM

## 2024-07-25 DIAGNOSIS — M7711 Lateral epicondylitis, right elbow: Secondary | ICD-10-CM

## 2024-07-25 DIAGNOSIS — G4733 Obstructive sleep apnea (adult) (pediatric): Secondary | ICD-10-CM

## 2024-07-25 DIAGNOSIS — M797 Fibromyalgia: Secondary | ICD-10-CM

## 2024-07-25 DIAGNOSIS — I2510 Atherosclerotic heart disease of native coronary artery without angina pectoris: Secondary | ICD-10-CM

## 2024-07-25 DIAGNOSIS — I480 Paroxysmal atrial fibrillation: Secondary | ICD-10-CM

## 2024-07-25 DIAGNOSIS — G8929 Other chronic pain: Secondary | ICD-10-CM

## 2024-07-25 DIAGNOSIS — Z8659 Personal history of other mental and behavioral disorders: Secondary | ICD-10-CM

## 2024-07-25 DIAGNOSIS — M7062 Trochanteric bursitis, left hip: Secondary | ICD-10-CM

## 2024-07-25 DIAGNOSIS — M533 Sacrococcygeal disorders, not elsewhere classified: Secondary | ICD-10-CM

## 2024-07-25 DIAGNOSIS — I10 Essential (primary) hypertension: Secondary | ICD-10-CM

## 2024-08-11 ENCOUNTER — Other Ambulatory Visit: Payer: Self-pay

## 2024-08-22 ENCOUNTER — Encounter: Attending: Registered Nurse | Admitting: Registered Nurse

## 2024-08-22 VITALS — BP 111/66 | HR 84 | Wt 165.0 lb

## 2024-08-22 DIAGNOSIS — G8929 Other chronic pain: Secondary | ICD-10-CM | POA: Diagnosis present

## 2024-08-22 DIAGNOSIS — M545 Low back pain, unspecified: Secondary | ICD-10-CM | POA: Diagnosis not present

## 2024-08-22 DIAGNOSIS — M7062 Trochanteric bursitis, left hip: Secondary | ICD-10-CM | POA: Diagnosis not present

## 2024-08-22 DIAGNOSIS — G894 Chronic pain syndrome: Secondary | ICD-10-CM | POA: Diagnosis not present

## 2024-08-22 DIAGNOSIS — Z5181 Encounter for therapeutic drug level monitoring: Secondary | ICD-10-CM | POA: Diagnosis not present

## 2024-08-22 DIAGNOSIS — M7061 Trochanteric bursitis, right hip: Secondary | ICD-10-CM | POA: Diagnosis not present

## 2024-08-22 DIAGNOSIS — Z79891 Long term (current) use of opiate analgesic: Secondary | ICD-10-CM | POA: Insufficient documentation

## 2024-08-22 DIAGNOSIS — M546 Pain in thoracic spine: Secondary | ICD-10-CM | POA: Insufficient documentation

## 2024-08-22 MED ORDER — HYDROCODONE-ACETAMINOPHEN 5-325 MG PO TABS: 1.0000 | ORAL_TABLET | Freq: Four times a day (QID) | ORAL | 0 refills | Status: DC | PRN

## 2024-08-22 MED ORDER — HYDROCODONE-ACETAMINOPHEN 5-325 MG PO TABS: 1.0000 | ORAL_TABLET | Freq: Four times a day (QID) | ORAL | 0 refills | Status: AC | PRN

## 2024-08-22 NOTE — Progress Notes (Unsigned)
 "  Cardiology Office Note    Date:  08/23/2024  ID:  Kara Bush, DOB 05-25-1941, MRN [id number] PCP:  Dayna Motto, DO  Cardiologist:  Vinie JAYSON Maxcy, MD  Electrophysiologist:  OLE ONEIDA HOLTS, MD (Inactive)   Chief Complaint: Follow up for CAD   History of Present Illness: Kara Bush    Kara Bush is a 84 y.o. female with visit-pertinent history of  coronary artery disease s/p CABG (LIMA-LAD, SVG-diag, SVG-RCA) in 1998, aortic aneurysm s/p resection at time of CABG, PAD s/p iliac bypass in 1991 patent by last US  in 2024, TIA/CVA in 02/2023, PE in 2/014, paroxysmal atrial fibrillation, hyperlipidemia and hypertension.  Echocardiogram in 08/2017 showed an EF of 60 to 65%, G1 DD, mild AI and mild MR.  CTA in 09/2020 showed dilated thoracic aortic aneurysm at 4.1 cm and a 1 year follow-up study was recommended.   In 02/2021 she presented to [facility] in atrial fibrillation with RVR.  She reported heart rates in the range of 130 to 150 bpm.  Serial troponins were negative.  Her BNP was borderline at 263.1.  She received a dose of metoprolol  tartrate 25 mg in the emergency room and converted back to sinus rhythm. Metoprolol  succinate was increased to 50 mg daily.  Echo in 02/2021 showed an EF of 55 to 60%, moderate LVH, mild mitral valve regurgitation, trivial tricuspid valve regurgitation and mild dilation of the acing aortic root at 38 mm.  She was later restarted on CPAP therapy, now followed by Dr. Shlomo.   In April 2024 she was admitted for acute blood loss anemia and chest pain for which she received several units of PRBCs.  Eliquis  was held and she underwent EGD which showed mild ulcerative colitis and a single nonbleeding colon lesion.  Eliquis  was resumed at a reduced dose.  She was referred to Dr. Holts for consideration of Watchman. It was also recommended she have outpatient PET stress, does not appear to have been completed. She was evaluated by Dr. Holts on 01/23/2023 for  consideration of watchman.  Given patient's history of unprovoked PE in 2014 a Watchman device would not eliminate need for anticoagulation.   On 02/04/2023 she presented to the ED with complaints of nausea, disorientation, slurred speech.  Initial EKG showed changes concerning for ischemia and STEMI was called.  Cardiology was consulted and STEMI was canceled given slightly elevated but flat troponin, felt to be demand ischemia.  Potassium was 2.1 and hemoglobin is normal.  Echo showed normal LV/RV function with no RWMA.  MRI of the brain showed small acute infarct.  Cardiology was consulted for patient complaints of dyspnea.  She is found to be in A-fib at time of consult.. Echocardiogram indicated LVEF of 55 to 60%, no RWMA, diastolic parameters were indeterminate, RV size and function was normal, there was normal pulmonary artery systolic pressures there was moderate aortic valve stenosis with partial fusion of right and left cusps.  She was started on Entresto  and given IV Lasix  with plan to restart home dose of p.o. Lasix .  Eliquis  remained at reduced dose secondary to downtrending hemoglobin.  Hospital course creatinine worsened Entresto  and Lasix  was held.  Patient was discharged on 02/14/2023.   On 03/02/2023 she presented to the ED for progressive lower extremity swelling, shortness of breath and increased oxygen  need.  She was noted to have elevated blood pressure and elevated BNP.  She was started on furosemide  and admitted for CHF.  She was diuresed 8  L during admission.   Her Entresto  was resumed along with her Imdur  and metoprolol .  She was restarted on Eliquis  and spironolactone .  She was seen in clinic on 03/26/2023 by Josefa Beauvais, NP. Her hydralazine  was decreased to 10 mg 3 times daily.   Kara Bush was last seen in clinic on 06/12/2023 by Dr. Mona.  She reported some dizzy episodes, she was hypotensive at 90/40, her hydralazine  was discontinued.   Patient was last seen in clinic by Dr.  Mona on 02/24/2024.  She had remained stable from a cardiac standpoint.  Echocardiogram was ordered for evaluation of aortic stenosis.  Zetia  10 mg daily was added to her regimen for hyperlipidemia.  Echocardiogram on 04/01/2024 indicated LVEF 70 to 75%, no RWMA, diastolic parameters indeterminate, RV systolic function was normal, RV size mildly enlarged, mild biatrial enlargement, mild mitral regurgitation, tricuspid valve regurgitation was severe, aortic valve regurgitation was mild to moderate, aortic dilation was noted at 41 mm.  Repeat echo was ordered for 1 year.  Today she presents for follow-up.  She reports that she has been doing okay, she has noted some progressing fatigue and dyspnea on exertion in recent months.  She notes that she has also had bilateral numbness in her legs, denies pain or claudication based symptoms.  She denies any chest pain, lower extremity edema, orthopnea or PND.  Patient notes that she has history of feeling a slight skipped beat sensation, notes some slight dizziness with position changes however denies any dizziness or lightheadedness at rest, denies any presyncope or syncope. ROS: .   Today she denies chest pain, lower extremity edema, palpitations, melena, hematuria, hemoptysis, diaphoresis, weakness, presyncope, syncope, orthopnea, and PND.  All other systems are reviewed and otherwise negative. Studies Reviewed: Kara Bush   EKG:  EKG is ordered today, personally reviewed, demonstrating  EKG Interpretation Date/Time:  Tuesday August 23 2024 13:50:23 EST Ventricular Rate:  76 PR Interval:  226 QRS Duration:  80 QT Interval:  372 QTC Calculation: 418 R Axis:   -9  Text Interpretation: Sinus rhythm with sinus arrhythmia with 1st degree A-V block Septal infarct (cited on or before 24-Feb-2024) When compared with ECG of 23-Aug-2024 13:36, No significant change was found Confirmed by Lenee Franze [phone]) on 08/23/2024 4:49:53 PM   CV Studies: Cardiac studies reviewed are  outlined and summarized above. Otherwise please see EMR for full report. Cardiac Studies & Procedures   ______________________________________________________________________________________________   STRESS TESTS  NM MYOCAR MULTI W/SPECT W 08/07/2011   ECHOCARDIOGRAM  ECHOCARDIOGRAM COMPLETE 04/01/2024  Narrative ECHOCARDIOGRAM REPORT    Patient Name:   CELESTE TAVENNER Date of Exam: 04/01/2024 Medical Rec #:  [id number]          Height:       63.0 in Accession #:    [id number]         Weight:       164.0 lb Date of Birth:  1940-10-28         BSA:          1.777 m Patient Age:    82 years           BP:           122/73 mmHg Patient Gender: F                  HR:           87 bpm. Exam Location:  [address]  Procedure: 2D Echo, 3D Echo, Cardiac Doppler and  Color Doppler (Both Spectral and Color Flow Doppler were utilized during procedure).  Indications:     I35.0 Aortic Stenosis  History:         Patient has prior history of Echocardiogram examinations, most recent 02/05/2023. CAD, Prior CABG and Prior Cardiac Surgery, PAD, Aortic Valve Disease, Arrythmias:Atrial Fibrillation, Signs/Symptoms:Dizziness/Lightheadedness and Fatigue; Risk Factors:Sleep Apnea, Family History of Coronary Artery Disease, Former Smoker and Dyslipidemia. Thoracic Aortic Aneurysm status post Repair, History of Pulmonary Embolism (2014).  Sonographer:     Heather Hawks RDCS Referring Phys:  VINIE BROCKS HILTY Diagnosing Phys: Vina Gull MD  IMPRESSIONS   1. Left ventricular ejection fraction, by estimation, is 70 to 75%. Left ventricular ejection fraction by 3D volume is 74 %. The left ventricle has hyperdynamic function. The left ventricle has no regional wall motion abnormalities. Left ventricular diastolic parameters are indeterminate. The average left ventricular global longitudinal strain is -21.2 %. The global longitudinal strain is normal. 2. Right ventricular systolic function is normal.  The right ventricular size is mildly enlarged. 3. Left atrial size was mildly dilated. 4. Right atrial size was mildly dilated. 5. The mitral valve is normal in structure. Mild mitral valve regurgitation. 6. Tricuspid valve regurgitation is severe. 7. AV is thickened, calcified with mildly restricted motion. There appears to be at least partial fusion of R and L coronary cusps. Prominent calcification along raphe. Peak and mean gradients through the valve are 34 and 19 mm Hg respectively Overall consistent with mild to moderate AS. Compared to previous echo, minor change in mean gradient. . Aortic valve regurgitation is mild to moderate. 8. Aortic dilatation noted. There is mild dilatation of the ascending aorta, measuring 41 mm. 9. The inferior vena cava is normal in size with greater than 50% respiratory variability, suggesting right atrial pressure of 3 mmHg.  Comparison(s): The left ventricular function is unchanged.  FINDINGS Left Ventricle: Left ventricular ejection fraction, by estimation, is 70 to 75%. Left ventricular ejection fraction by 3D volume is 74 %. The left ventricle has hyperdynamic function. The left ventricle has no regional wall motion abnormalities. The average left ventricular global longitudinal strain is -21.2 %. Strain was performed and the global longitudinal strain is normal. The left ventricular internal cavity size was normal in size. There is no left ventricular hypertrophy. Left ventricular diastolic parameters are indeterminate.  Right Ventricle: The right ventricular size is mildly enlarged. Right vetricular wall thickness was not assessed. Right ventricular systolic function is normal.  Left Atrium: Left atrial size was mildly dilated.  Right Atrium: Right atrial size was mildly dilated.  Pericardium: There is no evidence of pericardial effusion.  Mitral Valve: The mitral valve is normal in structure. Mild mitral valve regurgitation.  Tricuspid Valve:  The tricuspid valve is normal in structure. Tricuspid valve regurgitation is severe.  Aortic Valve: AV is thickened, calcified with mildly restricted motion. There appears to be at least partial fusion of R and L coronary cusps. Prominent calcification along raphe. Peak and mean gradients through the valve are 34 and 19 mm Hg respectively Overall consistent with mild to moderate AS. Compared to previous echo, minor change in mean gradient. Aortic valve regurgitation is mild to moderate. Aortic regurgitation PHT measures 328 msec. Aortic valve mean gradient measures 18.2 mmHg. Aortic valve peak gradient measures 31.1 mmHg. Aortic valve area, by VTI measures 1.02 cm.  Pulmonic Valve: The pulmonic valve was normal in structure. Pulmonic valve regurgitation is mild.  Aorta: The aortic root is normal in size and  structure and aortic dilatation noted. There is mild dilatation of the ascending aorta, measuring 41 mm.  Venous: The inferior vena cava is normal in size with greater than 50% respiratory variability, suggesting right atrial pressure of 3 mmHg.  IAS/Shunts: No atrial level shunt detected by color flow Doppler.  Additional Comments: 3D was performed not requiring image post processing on an independent workstation and was normal.   LEFT VENTRICLE PLAX 2D LVIDd:         4.20 cm         Diastology LVIDs:         2.50 cm         LV e' medial:    7.29 cm/s LV PW:         0.90 cm         LV E/e' medial:  13.6 LV IVS:        0.80 cm         LV e' lateral:   10.30 cm/s LVOT diam:     2.20 cm         LV E/e' lateral: 9.6 LV SV:         76 LV SV Index:   43              2D Longitudinal LVOT Area:     3.80 cm        Strain 2D Strain GLS   -21.2 % Avg:  3D Volume EF LV 3D EF:    Left ventricul ar ejection fraction by 3D volume is 74 %.  3D Volume EF: 3D EF:        74 % LV EDV:       108 ml LV ESV:       28 ml LV SV:        81 ml  RIGHT VENTRICLE RV Basal diam:  4.00 cm RV  Mid diam:    3.20 cm RV S prime:     11.60 cm/s TAPSE (M-mode): 1.8 cm RVSP:           32.6 mmHg  LEFT ATRIUM             Index        RIGHT ATRIUM           Index LA diam:        3.90 cm 2.19 cm/m   RA Pressure: 3.00 mmHg LA Vol (A2C):   71.0 ml 39.95 ml/m  RA Area:     25.40 cm LA Vol (A4C):   49.3 ml 27.74 ml/m  RA Volume:   88.70 ml  49.91 ml/m LA Biplane Vol: 63.9 ml 35.95 ml/m AORTIC VALVE AV Area (Vmax):    1.12 cm AV Area (Vmean):   1.09 cm AV Area (VTI):     1.02 cm AV Vmax:           279.00 cm/s AV Vmean:          199.800 cm/s AV VTI:            0.748 m AV Peak Grad:      31.1 mmHg AV Mean Grad:      18.2 mmHg LVOT Vmax:         82.00 cm/s LVOT Vmean:        57.150 cm/s LVOT VTI:          0.201 m LVOT/AV VTI ratio: 0.27 AI PHT:            328  msec  AORTA Ao Root diam: 3.60 cm Ao Asc diam:  4.15 cm  MITRAL VALVE                TRICUSPID VALVE MV Area (PHT): cm          TR Peak grad:   29.6 mmHg MV Decel Time: 190 msec     TR Vmax:        272.00 cm/s MV E velocity: 99.10 cm/s   Estimated RAP:  3.00 mmHg MV A velocity: 102.50 cm/s  RVSP:           32.6 mmHg MV E/A ratio:  0.97 SHUNTS Systemic VTI:  0.20 m Systemic Diam: 2.20 cm  Vina Gull MD Electronically signed by Vina Gull MD Signature Date/Time: 04/01/2024/1:17:43 PM    Final (Updated)    MONITORS  LONG TERM MONITOR (3-14 DAYS) 09/16/2018  Narrative Monitor shows paroxysmal afib.  Vinie KYM Maxcy, MD, [facility], FACP [place]  [facility] Medical Director of the [facility] Diplomate of the American Board of Clinical Lipidology Attending Cardiologist Direct Dial: [phone]  Fax: [phone] Website:  [email or web address]       ______________________________________________________________________________________________       Current Reported Medications:.    Active Medications[1]  Physical Exam:    VS:  BP (!)  114/52   Pulse 73   Ht 5' 4 (1.626 m)   Wt 165 lb 3.2 oz (74.9 kg)   SpO2 99%   BMI 28.36 kg/m    Wt Readings from Last 3 Encounters:  08/23/24 165 lb 3.2 oz (74.9 kg)  08/22/24 165 lb (74.8 kg)  06/20/24 167 lb 9.6 oz (76 kg)    GEN: Well nourished, well developed in no acute distress NECK: No JVD; No carotid bruits CARDIAC: RRR, no murmurs, rubs, gallops RESPIRATORY:  Clear to auscultation without rales, wheezing or rhonchi  ABDOMEN: Soft, non-tender, non-distended EXTREMITIES:  No edema; No acute deformity     Asessement and Plan:.    CAD: S/p CABG x 3 in 1998.  Multiple PCI with stents in 2005, 2006, 2007.  Nuclear stress in 2014 was negative for ischemia.  Patient was admitted in 11/2022 with chest pain and acute blood loss anemia, it was recommended that she have outpatient PET stress, does not appear this was completed.  Patient denies any chest pain, notes some fatigue and shortness of breath, question if related to anemia will check CBC today.  Reviewed ED precautions.  Continue Eliquis  5 mg twice daily, Lipitor 40 mg daily, Zetia  10 mg daily, Lasix  40 mg 3 times weekly, Imdur  30 mg daily, metoprolol  succinate 25 mg daily, Entresto  24 to 26 mg twice daily and spironolactone  25 mg daily.  Chronic diastolic CHF:  Echocardiogram in 02/05/23 indicated LVEF of 55 to 60%, no RWMA, diastolic parameters were indeterminate, RV size and function was normal, there was normal pulmonary artery systolic pressures there was moderate aortic valve stenosis with partial fusion of right and left cusps.  Patient notes some dyspnea on exertion as well as fatigue, question of anemia given patient's history, will check CBC today.  Continue Lasix  40 mg 3 times a week, Imdur  30 mg daily, metoprolol  succinate 75 mg daily, Entresto  24-26 mg twice daily and spironolactone  25 mg daily.  Aortic stenosis/Bicuspid aortic vavle: Patient noted to have mild to moderate AS and mild to moderate AI on echocardiogram on  04/01/2024. Today she reports that she does have some dyspnea on exertion,  question if anemia given prior presentations.  She denies any increased lower extremity edema, orthopnea or PND.  She appears euvolemic and well compensated on exam. Repeat echo scheduled for August for ongoing monitoring.  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  EKG repeated with no evidence of dropped beat, reviewed EKG and rhythm strip with Dr. Almetta, he agreed patient in sinus rhythm with intermittent second-degree heart block type I as well as PACs resulting in sinus arrhythmia.  Patient overall asymptomatic, notes occasional feelings of skipped beat sensation, denies any dizziness, lightheadedness, presyncope or syncope.  She is agreeable to wearing 1 week ZIO monitor for monitoring, Dr. Almetta recommended continuing metoprolol  at current dose.  Reviewed ED precautions with patient.  She denies bleeding problems on Eliquis .  Check CBC, c-Met, TSH with reflex and magnesium  level. Continue metoprolol  succinate 75 mg daily.  HTN: Blood pressure today 114/52.  Continue current antihypertensive regimen.  Peripheral arterial disease: s/p aortic iliac bypass in 1991.  Patient denies lower extremity pain, has noted some bilateral leg numbness, plans to follow-up with Dr. Gari as she has not been seen in over a year and a half.   Disposition: F/u with Tiann Saha, NP in 8 weeks or sooner if needed.   Signed, Tijuana Scheidegger D Duffy Dantonio, NP       [1]  Current Meds  Medication Sig   acetaminophen  (TYLENOL ) 500 MG tablet Take 500 mg by mouth as needed for moderate pain (pain score 4-6).   atorvastatin  (LIPITOR) 40 MG tablet TAKE 1 TABLET BY MOUTH EVERY DAY   citalopram  (CELEXA ) 20 MG tablet Take 1 tablet (20 mg total) by mouth daily.   Cyanocobalamin  (VITAMIN B 12 PO) VITAMIN B   ELIQUIS  5 MG TABS tablet TAKE 1 TABLET BY MOUTH TWICE A DAY    ezetimibe  (ZETIA ) 10 MG tablet Take 1 tablet (10 mg total) by mouth daily.   furosemide  (LASIX ) 40 MG tablet TAKE 1 TABLET BY MOUTH EVERY MONDAY, WEDNESDAY, AND FRIDAY   HYDROcodone -acetaminophen  (NORCO/VICODIN) 5-325 MG tablet Take 1 tablet by mouth 4 (four) times daily as needed for moderate pain (pain score 4-6). May Take an extra 0.5 to one tablet when pain is severe.   Iron , Ferrous Sulfate , 325 (65 Fe) MG TABS Take 325 mg by mouth 2 (two) times daily.   isosorbide  mononitrate (IMDUR ) 30 MG 24 hr tablet TAKE 1 TABLET BY MOUTH EVERY DAY   methocarbamol  (ROBAXIN ) 500 MG tablet TAKE 1 TABLET BY MOUTH EVERY 8 HOURS AS NEEDED FOR MUSCLE SPASMS   metoprolol  succinate (TOPROL -XL) 25 MG 24 hr tablet TAKE 3 TABLETS (75 MG TOTAL) BY MOUTH DAILY. TAKE WITH OR IMMEDIATELY FOLLOWING A MEAL.   MULTIPLE VITAMIN PO    pantoprazole  (PROTONIX ) 40 MG tablet Take 1 tablet (40 mg total) by mouth daily.   potassium chloride  (KLOR-CON  M) 10 MEQ tablet Take 1 tablet (10 mEq total) by mouth every Monday, Wednesday, and Friday.   potassium chloride  (KLOR-CON ) 10 MEQ tablet Take 1 tablet (10 mEq total) by mouth 3 (three) times a week. WITH LAIX AS NEEDED   sacubitril -valsartan  (ENTRESTO ) 24-26 MG Take 1 tablet by mouth 2 (two) times daily.   spironolactone  (ALDACTONE ) 25 MG tablet TAKE 1 TABLET (25 MG TOTAL) BY MOUTH DAILY.   traZODone  (DESYREL ) 50 MG tablet Take 1 tablet (50 mg total) by mouth at bedtime as needed for sleep.   "

## 2024-08-22 NOTE — Progress Notes (Unsigned)
 "  Subjective:    Patient ID: Kara Bush, female    DOB: October 09, 1940, 84 y.o.   MRN: [id number]  HPI: Kara Bush is a 84 y.o. female who returns for follow up appointment for chronic pain and medication refill. states *** pain is located in  ***. rates pain ***. current exercise regime is walking and performing stretching exercises.  Ms. Wacha Morphine  equivalent is *** MME.   Last Oral Swab was {Performed on 06/20/2024, it was consistent.      Pain Inventory Average Pain 4 Pain Right Now 4 My pain is intermittent, constant, and aching  In the last 24 hours, has pain interfered with the following? General activity 5 Relation with others 1 Enjoyment of life 5 What TIME of day is your pain at its worst? morning  Sleep (in general) Fair  Pain is worse with: walking, bending, and some activites Pain improves with: rest and medication Relief from Meds: fair  Family History  Problem Relation Age of Onset   Heart failure Mother        at 70   Heart disease Father    Heart attack Father        @ 61   Healthy Sister    Cancer Brother        pancreatic    Cancer Paternal Aunt        BREAST   Heart failure Maternal Grandmother    Heart attack Maternal Grandfather    Heart attack Paternal Grandmother        at 13   Healthy Daughter    Healthy Daughter    Social History   Socioeconomic History   Marital status: Divorced    Spouse name: Not on file   Number of children: 2   Years of education: college    Highest education level: Not on file  Occupational History    Employer: RETIRED  Tobacco Use   Smoking status: Former    Current packs/day: 0.00    Average packs/day: 2.5 packs/day for 20.0 years (50.0 ttl pk-yrs)    Types: Cigarettes    Start date: 06/29/1970    Quit date: 06/29/1990    Years since quitting: 34.1    Passive exposure: Never   Smokeless tobacco: Never  Vaping Use   Vaping status: Never Used  Substance and Sexual Activity   Alcohol   use: No   Drug use: No   Sexual activity: Not on file  Other Topics Concern   Not on file  Social History Narrative   Not on file   Social Drivers of Health   Tobacco Use: Medium Risk (06/20/2024)   Patient History    Smoking Tobacco Use: Former    Smokeless Tobacco Use: Never    Passive Exposure: Never  Physicist, Medical Strain: Not on file  Food Insecurity: No Food Insecurity (09/04/2023)   Hunger Vital Sign    Worried About Running Out of Food in the Last Year: Never true    Ran Out of Food in the Last Year: Never true  Transportation Needs: No Transportation Needs (09/04/2023)   PRAPARE - Administrator, Civil Service (Medical): No    Lack of Transportation (Non-Medical): No  Physical Activity: Not on file  Stress: Not on file  Social Connections: Socially Isolated (09/04/2023)   Social Connection and Isolation Panel    Frequency of Communication with Friends and Family: More than three times a week    Frequency of Social Gatherings with  Friends and Family: Three times a week    Attends Religious Services: Never    Active Member of Clubs or Organizations: No    Attends Banker Meetings: Never    Marital Status: Divorced  Depression (PHQ2-9): Low Risk (04/20/2024)   Depression (PHQ2-9)    PHQ-2 Score: 1  Alcohol  Screen: Not on file  Housing: Low Risk (09/04/2023)   Housing Stability Vital Sign    Unable to Pay for Housing in the Last Year: No    Number of Times Moved in the Last Year: 0    Homeless in the Last Year: No  Utilities: Not At Risk (09/04/2023)   AHC Utilities    Threatened with loss of utilities: No  Health Literacy: Not on file   Past Surgical History:  Procedure Laterality Date   AORTOBIEXTERNAL ILIAC BYPASS  1991   BIOPSY  11/25/2022   Procedure: BIOPSY;  Surgeon: Rollin Dover, MD;  Location: [facility] ENDOSCOPY;  Service: Gastroenterology;;   CATARACT EXTRACTION Left 09/2019   CHOLECYSTECTOMY  1997   COLONOSCOPY WITH PROPOFOL  N/A  11/25/2022   Procedure: COLONOSCOPY WITH PROPOFOL ;  Surgeon: Rollin Dover, MD;  Location: [facility] ENDOSCOPY;  Service: Gastroenterology;  Laterality: N/A;   CORONARY ANGIOPLASTY WITH STENT PLACEMENT  2006   to LAD, ATRETIC LIMA AT THAT TIME ALSO   CORONARY ANGIOPLASTY WITH STENT PLACEMENT  2007   STENT TO PROX VG-DIAG, OTHER STENTS PATENT   CORONARY ARTERY BYPASS GRAFT  1998   LIMA-LAD;VG-DIAG & RCA   CORONARY STENT PLACEMENT  2005   TO LAD & LCX-STENTS,   ESOPHAGOGASTRODUODENOSCOPY (EGD) WITH PROPOFOL  N/A 11/25/2022   Procedure: ESOPHAGOGASTRODUODENOSCOPY (EGD) WITH PROPOFOL ;  Surgeon: Rollin Dover, MD;  Location: [facility] ENDOSCOPY;  Service: Gastroenterology;  Laterality: N/A;   HERNIA REPAIR     TUBAL LIGATION     BTL   Past Surgical History:  Procedure Laterality Date   AORTOBIEXTERNAL ILIAC BYPASS  1991   BIOPSY  11/25/2022   Procedure: BIOPSY;  Surgeon: Rollin Dover, MD;  Location: [facility] ENDOSCOPY;  Service: Gastroenterology;;   CATARACT EXTRACTION Left 09/2019   CHOLECYSTECTOMY  1997   COLONOSCOPY WITH PROPOFOL  N/A 11/25/2022   Procedure: COLONOSCOPY WITH PROPOFOL ;  Surgeon: Rollin Dover, MD;  Location: [facility] ENDOSCOPY;  Service: Gastroenterology;  Laterality: N/A;   CORONARY ANGIOPLASTY WITH STENT PLACEMENT  2006   to LAD, ATRETIC LIMA AT THAT TIME ALSO   CORONARY ANGIOPLASTY WITH STENT PLACEMENT  2007   STENT TO PROX VG-DIAG, OTHER STENTS PATENT   CORONARY ARTERY BYPASS GRAFT  1998   LIMA-LAD;VG-DIAG & RCA   CORONARY STENT PLACEMENT  2005   TO LAD & LCX-STENTS,   ESOPHAGOGASTRODUODENOSCOPY (EGD) WITH PROPOFOL  N/A 11/25/2022   Procedure: ESOPHAGOGASTRODUODENOSCOPY (EGD) WITH PROPOFOL ;  Surgeon: Rollin Dover, MD;  Location: [facility] ENDOSCOPY;  Service: Gastroenterology;  Laterality: N/A;   HERNIA REPAIR     TUBAL LIGATION     BTL   Past Medical History:  Diagnosis Date   Abdominal aortic aneurysm    Anemia, improved 01/11/2013   Basal cell carcinoma    CAD (coronary artery disease)     hx CABG 1998, last cath 2007 with PCI and stenting to the proximal segment of the vein graft to the diagonal branch. DES Taxus was placed   Cervicalgia    Chronic fatigue fibromyalgia syndrome    Chronic pain syndrome    Depression    Fibromyalgia    History of nuclear stress test 08/07/2011   lexiscan ; no evidence of  ischemia or infarct; low risk    Hyperlipidemia    Hypertension    OSA (obstructive sleep apnea)    PAD (peripheral artery disease)    Pes anserine bursitis    Pulmonary embolism (HCC) 01/11/2013   S/P resection of aortic aneurysm    Trochanteric bursitis    Ulcerative colitis (HCC)    There were no vitals taken for this visit.  Opioid Risk Score:   Fall Risk Score:  `1  Depression screen [facility] 2/9     04/20/2024   10:03 AM 02/22/2024    1:39 PM 12/25/2023    1:39 PM 10/22/2023   10:20 AM 08/20/2023    1:35 PM 04/22/2023    2:28 PM 02/25/2023    1:31 PM  Depression screen PHQ 2/9  Decreased Interest 1 0 0 0 0 0 0  Down, Depressed, Hopeless 0 0 0 0 0 0 0  PHQ - 2 Score 1 0 0 0 0 0 0    Review of Systems  Musculoskeletal:  Positive for back pain and gait problem.       Pain in both hips  All other systems reviewed and are negative.      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 Continue to Monitor, Continue Current Medication and exercise regime. 06/20/2024 2. Fibromyalgia: Continue  Home Exercise Program. Continue to Monitor.06/20/2024 3. Bilateral intermittent hand numbness: Carpal tunnel syndrome versus C6 radiculopathy mild: No complaints today. Continue to Monitor. 06/20/2024 4. Chronic Midline Low Back Pain/ LBP: Refilled: Norco 5/325mg  # 140 pills--use one pill every 6 hours as needed for pain. A second prescription was e-scribe for the following month 06/20/2024 We will continue the opioid monitoring program, this consists of regular clinic  visits, examinations, urine drug screen, pill counts as well as use of [place]  Controlled Substance Reporting system. A 12 month History has been reviewed on the [place]  Controlled Substance Reporting System on 06/20/2024. 5. Muscle Spasm: Continue current medication regimen with  Robaxin . 06/20/2024 6.Bilateral  Greater Trochanteric Bursitis:Continue to Alternate with heat and ice therapy. Continue current medication regime. 06/20/2024  7. Left Foot pain: No complaints today.Continue with HEP as Tolerated. Continue to Monitor. 06/20/2024 8. Right Lateral Epicondylitis Pain: No complaints Today. Continue HEP as Tolerated. Continue to Monitor. 06/20/2024 9. Chronic Right Shoulder Pain /Right Shoulder Tendonitis: Continue Current Medication Regimen. Continue to Alternate Ice and Heat Therapy. 06/20/2024 10 Right Chronic Knee Pain: .No complaints today. Continue HEP as Tolerated. Continue to Monitor. 06/20/2024   F/U in 2 month "

## 2024-08-23 ENCOUNTER — Encounter: Payer: Self-pay | Admitting: Registered Nurse

## 2024-08-23 ENCOUNTER — Encounter: Payer: Self-pay | Admitting: Cardiology

## 2024-08-23 ENCOUNTER — Ambulatory Visit: Attending: Cardiology | Admitting: Cardiology

## 2024-08-23 ENCOUNTER — Ambulatory Visit

## 2024-08-23 VITALS — BP 114/52 | HR 73 | Ht 64.0 in | Wt 165.2 lb

## 2024-08-23 DIAGNOSIS — I350 Nonrheumatic aortic (valve) stenosis: Secondary | ICD-10-CM

## 2024-08-23 DIAGNOSIS — I491 Atrial premature depolarization: Secondary | ICD-10-CM | POA: Diagnosis not present

## 2024-08-23 DIAGNOSIS — I441 Atrioventricular block, second degree: Secondary | ICD-10-CM | POA: Diagnosis not present

## 2024-08-23 DIAGNOSIS — R002 Palpitations: Secondary | ICD-10-CM | POA: Diagnosis not present

## 2024-08-23 DIAGNOSIS — I5032 Chronic diastolic (congestive) heart failure: Secondary | ICD-10-CM | POA: Diagnosis not present

## 2024-08-23 DIAGNOSIS — I10 Essential (primary) hypertension: Secondary | ICD-10-CM

## 2024-08-23 DIAGNOSIS — I480 Paroxysmal atrial fibrillation: Secondary | ICD-10-CM

## 2024-08-23 DIAGNOSIS — I2510 Atherosclerotic heart disease of native coronary artery without angina pectoris: Secondary | ICD-10-CM | POA: Diagnosis not present

## 2024-08-23 NOTE — Patient Instructions (Signed)
 Medication Instructions:  Your physician recommends that you continue on your current medications as directed. Please refer to the Current Medication list given to you today.  *If you need a refill on your cardiac medications before your next appointment, please call your pharmacy*  Lab Work: TODAY: CMET, Magnesium  , CBC, TSH Rfx on abnormal to T4  If you have labs (blood work) drawn today and your tests are completely normal, you will receive your results only by: MyChart Message (if you have MyChart) OR A paper copy in the mail If you have any lab test that is abnormal or we need to change your treatment, we will call you to review the results.  Testing/Procedures: NONE  Follow-Up: At [facility], you and your health needs are our priority.  As part of our continuing mission to provide you with exceptional heart care, our providers are all part of one team.  This team includes your primary Cardiologist (physician) and Advanced Practice Providers or APPs (Physician Assistants and Nurse Practitioners) who all work together to provide you with the care you need, when you need it.  Your next appointment:   8-10 week(s)  Provider:   Vinie JAYSON Maxcy, MD or Katlyn West, NP   We recommend signing up for the patient portal called MyChart.  Sign up information is provided on this After Visit Summary.  MyChart is used to connect with patients for Virtual Visits (Telemedicine).  Patients are able to view lab/test results, encounter notes, upcoming appointments, etc.  Non-urgent messages can be sent to your provider as well.   To learn more about what you can do with MyChart, go to [email or web address].   Other Instructions ZIO XT- Long Term Monitor Instructions  Your physician has requested you wear a ZIO patch monitor for __7__ days.   This is a single patch monitor. [employer] supplies one patch monitor per enrollment. Additional  stickers are not available. Please do not apply  patch if you will be having a Nuclear Stress Test,  Echocardiogram, Cardiac CT, MRI, or Chest Xray during the period you would be wearing the  monitor. The patch cannot be worn during these tests. You cannot remove and re-apply the  ZIO XT patch monitor.   Your ZIO patch monitor will be mailed 3 day USPS to your address on file. It may take 3-5 days  to receive your monitor after you have been enrolled.   Once you have received your monitor, please review the enclosed instructions. Your monitor  has already been registered assigning a specific monitor serial # to you.     Billing and Patient Assistance Program Information  We have supplied [employer] with any of your insurance information on file for billing purposes.  [employer] offers a sliding scale Patient Assistance Program for patients that do not have  insurance, or whose insurance does not completely cover the cost of the ZIO monitor.  You must apply for the Patient Assistance Program to qualify for this discounted rate.   To apply, please call [employer] at [phone], select option 4, select option 2, ask to apply for  Patient Assistance Program. Meredeth will ask your household income, and how many people  are in your household. They will quote your out-of-pocket cost based on that information.  [employer] will also be able to set up a 59-month, interest-free payment plan if needed.     Applying the monitor  Shave hair from upper left chest.  Hold abrader disc by orange tab. Rub abrader  in 40 strokes over the upper left chest as  indicated in your monitor instructions.  Clean area with 4 enclosed alcohol  pads. Let dry.  Apply patch as indicated in monitor instructions. Patch will be placed under collarbone on left  side of chest with arrow pointing upward.  Rub patch adhesive wings for 2 minutes. Remove white label marked 1. Remove the white  label marked 2. Rub patch adhesive wings for 2 additional minutes.  While looking in a  mirror, press and release button in center of patch. A small green light will  flash 3-4 times. This will be your only indicator that the monitor has been turned on.  Do not shower for the first 24 hours. You may shower after the first 24 hours.  Press the button if you feel a symptom. You will hear a small click. Record Date, Time and  Symptom in the Patient Logbook.  When you are ready to remove the patch, follow instructions on the last 2 pages of Patient  Logbook. Stick patch monitor onto the last page of Patient Logbook.   Place Patient Logbook in the blue and white box. Use locking tab on box and tape box closed  securely. The blue and white box has prepaid postage on it. Please place it in the mailbox as  soon as possible. Your physician should have your test results approximately 7 days after the  monitor has been mailed back to [facility].   Call [facility] Customer Care at [phone] if you have questions regarding  your ZIO XT patch monitor. Call them immediately if you see an orange light blinking on your  monitor.   If your monitor falls off in less than 4 days, contact our Monitor department at [phone].   If your monitor becomes loose or falls off after 4 days call [employer] at [phone] for  suggestions on securing your monitor.

## 2024-08-23 NOTE — Progress Notes (Unsigned)
 [id number] from office inventory applied to patient. Dr. Mona to read.

## 2024-08-24 ENCOUNTER — Other Ambulatory Visit: Payer: Self-pay | Admitting: Vascular Surgery

## 2024-08-24 ENCOUNTER — Ambulatory Visit: Payer: Self-pay | Admitting: Cardiology

## 2024-08-24 DIAGNOSIS — Z95828 Presence of other vascular implants and grafts: Secondary | ICD-10-CM

## 2024-08-24 DIAGNOSIS — Z79899 Other long term (current) drug therapy: Secondary | ICD-10-CM

## 2024-08-24 DIAGNOSIS — I6529 Occlusion and stenosis of unspecified carotid artery: Secondary | ICD-10-CM

## 2024-08-24 DIAGNOSIS — I70213 Atherosclerosis of native arteries of extremities with intermittent claudication, bilateral legs: Secondary | ICD-10-CM

## 2024-08-24 LAB — COMPREHENSIVE METABOLIC PANEL WITH GFR
ALT: 15 IU/L (ref 0–32)
AST: 18 IU/L (ref 0–40)
Albumin: 4.2 g/dL (ref 3.7–4.7)
Alkaline Phosphatase: 45 IU/L — AB (ref 48–129)
BUN/Creatinine Ratio: 17 (ref 12–28)
BUN: 23 mg/dL (ref 8–27)
Bilirubin Total: 0.2 mg/dL (ref 0.0–1.2)
CO2: 25 mmol/L (ref 20–29)
Calcium: 9.4 mg/dL (ref 8.7–10.3)
Chloride: 104 mmol/L (ref 96–106)
Creatinine, Ser: 1.35 mg/dL — AB (ref 0.57–1.00)
Globulin, Total: 2 g/dL (ref 1.5–4.5)
Glucose: 106 mg/dL — AB (ref 70–99)
Potassium: 5 mmol/L (ref 3.5–5.2)
Sodium: 142 mmol/L (ref 134–144)
Total Protein: 6.2 g/dL (ref 6.0–8.5)
eGFR: 39 mL/min/1.73 — AB

## 2024-08-24 LAB — CBC
Hematocrit: 41.7 % (ref 34.0–46.6)
Hemoglobin: 13.3 g/dL (ref 11.1–15.9)
MCH: 27.7 pg (ref 26.6–33.0)
MCHC: 31.9 g/dL (ref 31.5–35.7)
MCV: 87 fL (ref 79–97)
Platelets: 202 x10E3/uL (ref 150–450)
RBC: 4.81 x10E6/uL (ref 3.77–5.28)
RDW: 12.6 % (ref 11.7–15.4)
WBC: 8.8 x10E3/uL (ref 3.4–10.8)

## 2024-08-24 LAB — TSH RFX ON ABNORMAL TO FREE T4: TSH: 1.83 u[IU]/mL (ref 0.450–4.500)

## 2024-08-24 LAB — MAGNESIUM: Magnesium: 1.5 mg/dL — AB (ref 1.6–2.3)

## 2024-08-25 ENCOUNTER — Ambulatory Visit (HOSPITAL_COMMUNITY)
Admission: RE | Admit: 2024-08-25 | Discharge: 2024-08-25 | Disposition: A | Discharge status: Home / Self Care | Source: Ambulatory Visit | Attending: Vascular Surgery | Admitting: Vascular Surgery

## 2024-08-25 ENCOUNTER — Ambulatory Visit (HOSPITAL_BASED_OUTPATIENT_CLINIC_OR_DEPARTMENT_OTHER)
Admission: RE | Admit: 2024-08-25 | Discharge: 2024-08-25 | Disposition: A | Discharge status: Home / Self Care | Source: Ambulatory Visit | Attending: Vascular Surgery | Admitting: Vascular Surgery

## 2024-08-25 DIAGNOSIS — I6529 Occlusion and stenosis of unspecified carotid artery: Secondary | ICD-10-CM | POA: Insufficient documentation

## 2024-08-25 DIAGNOSIS — I70213 Atherosclerosis of native arteries of extremities with intermittent claudication, bilateral legs: Secondary | ICD-10-CM | POA: Diagnosis not present

## 2024-08-25 DIAGNOSIS — Z95828 Presence of other vascular implants and grafts: Secondary | ICD-10-CM | POA: Diagnosis not present

## 2024-08-28 ENCOUNTER — Other Ambulatory Visit: Payer: Self-pay | Admitting: Internal Medicine

## 2024-08-31 ENCOUNTER — Ambulatory Visit: Admitting: Vascular Surgery

## 2024-09-09 DIAGNOSIS — R002 Palpitations: Secondary | ICD-10-CM

## 2024-09-09 DIAGNOSIS — I491 Atrial premature depolarization: Secondary | ICD-10-CM

## 2024-09-14 ENCOUNTER — Ambulatory Visit: Admitting: Vascular Surgery

## 2024-10-17 ENCOUNTER — Encounter: Attending: Registered Nurse | Admitting: Registered Nurse

## 2024-10-18 ENCOUNTER — Ambulatory Visit: Admitting: Cardiology
